# Patient Record
Sex: Female | Born: 1935
Health system: Southern US, Community
[De-identification: ages and names within clinical notes are randomized; demographics above are authoritative.]

## PROBLEM LIST (undated history)

## (undated) DIAGNOSIS — IMO0001 Reserved for inherently not codable concepts without codable children: Secondary | ICD-10-CM

## (undated) DIAGNOSIS — M21969 Unspecified acquired deformity of unspecified lower leg: Secondary | ICD-10-CM

## (undated) DIAGNOSIS — K219 Gastro-esophageal reflux disease without esophagitis: Secondary | ICD-10-CM

## (undated) DIAGNOSIS — K5790 Diverticulosis of intestine, part unspecified, without perforation or abscess without bleeding: Secondary | ICD-10-CM

## (undated) DIAGNOSIS — I2510 Atherosclerotic heart disease of native coronary artery without angina pectoris: Secondary | ICD-10-CM

## (undated) DIAGNOSIS — Z8719 Personal history of other diseases of the digestive system: Secondary | ICD-10-CM

## (undated) DIAGNOSIS — N289 Disorder of kidney and ureter, unspecified: Secondary | ICD-10-CM

## (undated) DIAGNOSIS — K2270 Barrett's esophagus without dysplasia: Secondary | ICD-10-CM

## (undated) DIAGNOSIS — F0390 Unspecified dementia without behavioral disturbance: Secondary | ICD-10-CM

## (undated) DIAGNOSIS — M479 Spondylosis, unspecified: Secondary | ICD-10-CM

## (undated) DIAGNOSIS — B009 Herpesviral infection, unspecified: Secondary | ICD-10-CM

## (undated) DIAGNOSIS — S68119A Complete traumatic metacarpophalangeal amputation of unspecified finger, initial encounter: Secondary | ICD-10-CM

## (undated) DIAGNOSIS — I10 Essential (primary) hypertension: Secondary | ICD-10-CM

## (undated) DIAGNOSIS — R739 Hyperglycemia, unspecified: Secondary | ICD-10-CM

## (undated) DIAGNOSIS — R030 Elevated blood-pressure reading, without diagnosis of hypertension: Secondary | ICD-10-CM

## (undated) DIAGNOSIS — R896 Abnormal cytological findings in specimens from other organs, systems and tissues: Secondary | ICD-10-CM

## (undated) HISTORY — DX: Barrett's esophagus without dysplasia: K22.70

## (undated) HISTORY — DX: Unspecified dementia, unspecified severity, without behavioral disturbance, psychotic disturbance, mood disturbance, and anxiety: F03.90

## (undated) HISTORY — DX: Essential (primary) hypertension: I10

## (undated) HISTORY — DX: Atherosclerotic heart disease of native coronary artery without angina pectoris: I25.10

## (undated) HISTORY — DX: Unspecified acquired deformity of unspecified lower leg: M21.969

## (undated) HISTORY — PX: COLONOSCOPY: SHX174

## (undated) HISTORY — DX: Spondylosis, unspecified: M47.9

## (undated) HISTORY — DX: Diverticulosis of intestine, part unspecified, without perforation or abscess without bleeding: K57.90

## (undated) HISTORY — DX: Complete traumatic metacarpophalangeal amputation of unspecified finger, initial encounter: S68.119A

## (undated) HISTORY — DX: Hyperglycemia, unspecified: R73.9

## (undated) HISTORY — PX: UPPER GASTROINTESTINAL ENDOSCOPY: SHX188

## (undated) HISTORY — DX: Personal history of other diseases of the digestive system: Z87.19

## (undated) HISTORY — DX: Reserved for inherently not codable concepts without codable children: IMO0001

## (undated) HISTORY — DX: Herpesviral infection, unspecified: B00.9

## (undated) HISTORY — DX: Gastro-esophageal reflux disease without esophagitis: K21.9

## (undated) HISTORY — PX: FOOT SURGERY: SHX648

## (undated) HISTORY — DX: Disorder of kidney and ureter, unspecified: N28.9

## (undated) HISTORY — DX: Elevated blood-pressure reading, without diagnosis of hypertension: R03.0

## (undated) HISTORY — PX: PARTIAL HYSTERECTOMY: SHX80

## (undated) HISTORY — PX: AMPUTATION FINGER / THUMB: SUR24

## (undated) HISTORY — DX: Abnormal cytological findings in specimens from other organs, systems and tissues: R89.6

---

## 1998-02-01 ENCOUNTER — Encounter: Admission: RE | Admit: 1998-02-01 | Discharge: 1998-04-04 | Payer: Self-pay | Admitting: Internal Medicine

## 1998-06-16 ENCOUNTER — Other Ambulatory Visit: Admission: RE | Admit: 1998-06-16 | Discharge: 1998-06-16 | Payer: Self-pay | Admitting: *Deleted

## 1998-06-22 ENCOUNTER — Encounter: Admission: RE | Admit: 1998-06-22 | Discharge: 1998-06-22 | Payer: Self-pay | Admitting: Internal Medicine

## 1999-06-21 ENCOUNTER — Other Ambulatory Visit: Admission: RE | Admit: 1999-06-21 | Discharge: 1999-06-21 | Payer: Self-pay | Admitting: *Deleted

## 2000-06-05 ENCOUNTER — Other Ambulatory Visit: Admission: RE | Admit: 2000-06-05 | Discharge: 2000-06-05 | Payer: Self-pay | Admitting: *Deleted

## 2001-03-28 ENCOUNTER — Emergency Department (HOSPITAL_COMMUNITY): Admission: EM | Admit: 2001-03-28 | Discharge: 2001-03-28 | Payer: Self-pay | Admitting: Emergency Medicine

## 2001-04-29 ENCOUNTER — Ambulatory Visit (HOSPITAL_COMMUNITY): Admission: RE | Admit: 2001-04-29 | Discharge: 2001-04-29 | Payer: Self-pay | Admitting: Family Medicine

## 2001-04-29 ENCOUNTER — Encounter: Admission: RE | Admit: 2001-04-29 | Discharge: 2001-04-29 | Payer: Self-pay | Admitting: Family Medicine

## 2001-05-31 ENCOUNTER — Encounter: Admission: RE | Admit: 2001-05-31 | Discharge: 2001-05-31 | Payer: Self-pay | Admitting: Family Medicine

## 2001-06-04 ENCOUNTER — Other Ambulatory Visit: Admission: RE | Admit: 2001-06-04 | Discharge: 2001-06-04 | Payer: Self-pay | Admitting: *Deleted

## 2001-07-22 ENCOUNTER — Encounter: Payer: Self-pay | Admitting: Family Medicine

## 2001-07-22 ENCOUNTER — Inpatient Hospital Stay (HOSPITAL_COMMUNITY): Admission: EM | Admit: 2001-07-22 | Discharge: 2001-07-29 | Payer: Self-pay | Admitting: *Deleted

## 2001-08-10 ENCOUNTER — Emergency Department (HOSPITAL_COMMUNITY): Admission: EM | Admit: 2001-08-10 | Discharge: 2001-08-10 | Payer: Self-pay | Admitting: Emergency Medicine

## 2001-09-17 ENCOUNTER — Encounter: Admission: RE | Admit: 2001-09-17 | Discharge: 2001-09-17 | Payer: Self-pay | Admitting: Family Medicine

## 2001-10-03 ENCOUNTER — Emergency Department (HOSPITAL_COMMUNITY): Admission: EM | Admit: 2001-10-03 | Discharge: 2001-10-03 | Payer: Self-pay | Admitting: Emergency Medicine

## 2001-10-04 ENCOUNTER — Encounter: Payer: Self-pay | Admitting: Emergency Medicine

## 2001-10-12 ENCOUNTER — Emergency Department (HOSPITAL_COMMUNITY): Admission: EM | Admit: 2001-10-12 | Discharge: 2001-10-12 | Payer: Self-pay | Admitting: *Deleted

## 2002-05-11 ENCOUNTER — Emergency Department (HOSPITAL_COMMUNITY): Admission: EM | Admit: 2002-05-11 | Discharge: 2002-05-11 | Payer: Self-pay | Admitting: Emergency Medicine

## 2002-05-21 ENCOUNTER — Ambulatory Visit (HOSPITAL_COMMUNITY): Admission: RE | Admit: 2002-05-21 | Discharge: 2002-05-21 | Payer: Self-pay | Admitting: Cardiovascular Disease

## 2006-11-29 ENCOUNTER — Inpatient Hospital Stay (HOSPITAL_COMMUNITY): Admission: EM | Admit: 2006-11-29 | Discharge: 2006-12-12 | Payer: Self-pay | Admitting: Emergency Medicine

## 2006-11-29 ENCOUNTER — Encounter (INDEPENDENT_AMBULATORY_CARE_PROVIDER_SITE_OTHER): Payer: Self-pay | Admitting: *Deleted

## 2008-04-30 ENCOUNTER — Encounter (HOSPITAL_COMMUNITY): Admission: RE | Admit: 2008-04-30 | Discharge: 2008-07-29 | Payer: Self-pay | Admitting: Emergency Medicine

## 2008-05-05 ENCOUNTER — Telehealth: Payer: Self-pay | Admitting: Gastroenterology

## 2008-05-06 ENCOUNTER — Ambulatory Visit: Payer: Self-pay | Admitting: Gastroenterology

## 2008-05-08 ENCOUNTER — Ambulatory Visit (HOSPITAL_COMMUNITY): Admission: RE | Admit: 2008-05-08 | Discharge: 2008-05-08 | Payer: Self-pay | Admitting: Gastroenterology

## 2008-05-11 ENCOUNTER — Encounter: Payer: Self-pay | Admitting: Gastroenterology

## 2008-05-14 ENCOUNTER — Encounter: Payer: Self-pay | Admitting: Gastroenterology

## 2008-05-14 ENCOUNTER — Ambulatory Visit: Payer: Self-pay | Admitting: Gastroenterology

## 2008-05-14 LAB — CONVERTED CEMR LAB
Basophils Relative: 1.2 % — ABNORMAL HIGH (ref 0.0–1.0)
Eosinophils Relative: 5.4 % — ABNORMAL HIGH (ref 0.0–5.0)
MCHC: 32.1 g/dL (ref 30.0–36.0)
MCV: 75.9 fL — ABNORMAL LOW (ref 78.0–100.0)
Monocytes Absolute: 0.5 10*3/uL (ref 0.1–1.0)
Neutrophils Relative %: 68.7 % (ref 43.0–77.0)
Platelets: 461 10*3/uL — ABNORMAL HIGH (ref 150–400)
RBC: 3.69 M/uL — ABNORMAL LOW (ref 3.87–5.11)
WBC: 5.5 10*3/uL (ref 4.5–10.5)

## 2008-05-20 ENCOUNTER — Encounter: Payer: Self-pay | Admitting: Gastroenterology

## 2008-05-22 ENCOUNTER — Telehealth: Payer: Self-pay | Admitting: Gastroenterology

## 2008-06-02 ENCOUNTER — Encounter: Payer: Self-pay | Admitting: Gastroenterology

## 2008-06-08 ENCOUNTER — Telehealth: Payer: Self-pay | Admitting: Gastroenterology

## 2008-06-16 DIAGNOSIS — I10 Essential (primary) hypertension: Secondary | ICD-10-CM

## 2008-06-16 DIAGNOSIS — K921 Melena: Secondary | ICD-10-CM | POA: Insufficient documentation

## 2008-06-16 DIAGNOSIS — K5730 Diverticulosis of large intestine without perforation or abscess without bleeding: Secondary | ICD-10-CM | POA: Insufficient documentation

## 2008-06-16 DIAGNOSIS — E785 Hyperlipidemia, unspecified: Secondary | ICD-10-CM

## 2008-06-16 DIAGNOSIS — M129 Arthropathy, unspecified: Secondary | ICD-10-CM | POA: Insufficient documentation

## 2008-06-16 DIAGNOSIS — G4730 Sleep apnea, unspecified: Secondary | ICD-10-CM | POA: Insufficient documentation

## 2008-06-16 DIAGNOSIS — H409 Unspecified glaucoma: Secondary | ICD-10-CM | POA: Insufficient documentation

## 2008-06-17 ENCOUNTER — Ambulatory Visit: Payer: Self-pay | Admitting: Gastroenterology

## 2008-06-17 DIAGNOSIS — K2270 Barrett's esophagus without dysplasia: Secondary | ICD-10-CM

## 2008-06-17 DIAGNOSIS — D509 Iron deficiency anemia, unspecified: Secondary | ICD-10-CM

## 2008-06-23 ENCOUNTER — Encounter: Payer: Self-pay | Admitting: Gastroenterology

## 2008-06-23 ENCOUNTER — Telehealth: Payer: Self-pay | Admitting: Gastroenterology

## 2008-06-29 ENCOUNTER — Telehealth: Payer: Self-pay | Admitting: Gastroenterology

## 2008-07-01 ENCOUNTER — Ambulatory Visit: Payer: Self-pay | Admitting: Gastroenterology

## 2008-07-01 LAB — CONVERTED CEMR LAB
Fecal Occult Blood: NEGATIVE
OCCULT 1: NEGATIVE
OCCULT 2: NEGATIVE

## 2008-07-02 ENCOUNTER — Ambulatory Visit: Payer: Self-pay | Admitting: Gastroenterology

## 2008-09-01 ENCOUNTER — Encounter: Payer: Self-pay | Admitting: Gastroenterology

## 2008-10-20 ENCOUNTER — Encounter: Payer: Self-pay | Admitting: Gastroenterology

## 2009-01-20 ENCOUNTER — Encounter: Payer: Self-pay | Admitting: Gastroenterology

## 2009-01-21 ENCOUNTER — Telehealth: Payer: Self-pay | Admitting: Gastroenterology

## 2009-04-20 ENCOUNTER — Encounter: Payer: Self-pay | Admitting: Gastroenterology

## 2009-05-26 ENCOUNTER — Encounter (INDEPENDENT_AMBULATORY_CARE_PROVIDER_SITE_OTHER): Payer: Self-pay | Admitting: *Deleted

## 2009-06-16 ENCOUNTER — Ambulatory Visit: Payer: Self-pay | Admitting: Gastroenterology

## 2009-06-29 ENCOUNTER — Telehealth: Payer: Self-pay | Admitting: Gastroenterology

## 2009-06-30 ENCOUNTER — Ambulatory Visit: Payer: Self-pay | Admitting: Gastroenterology

## 2009-06-30 ENCOUNTER — Encounter: Payer: Self-pay | Admitting: Gastroenterology

## 2009-07-02 ENCOUNTER — Encounter: Payer: Self-pay | Admitting: Gastroenterology

## 2009-07-02 ENCOUNTER — Telehealth: Payer: Self-pay | Admitting: Gastroenterology

## 2009-08-04 ENCOUNTER — Ambulatory Visit: Payer: Self-pay | Admitting: Gastroenterology

## 2009-08-04 LAB — CONVERTED CEMR LAB
Basophils Absolute: 0 10*3/uL (ref 0.0–0.1)
Basophils Relative: 0.1 % (ref 0.0–3.0)
Eosinophils Absolute: 0.5 10*3/uL (ref 0.0–0.7)
Eosinophils Relative: 6.9 % — ABNORMAL HIGH (ref 0.0–5.0)
Hemoglobin: 12.8 g/dL (ref 12.0–15.0)
MCV: 92.6 fL (ref 78.0–100.0)
Neutro Abs: 4.5 10*3/uL (ref 1.4–7.7)
Platelets: 341 10*3/uL (ref 150.0–400.0)
RBC: 4.05 M/uL (ref 3.87–5.11)

## 2009-08-10 ENCOUNTER — Telehealth: Payer: Self-pay | Admitting: Gastroenterology

## 2010-01-26 ENCOUNTER — Telehealth: Payer: Self-pay | Admitting: Gastroenterology

## 2010-01-26 ENCOUNTER — Encounter: Payer: Self-pay | Admitting: Gastroenterology

## 2010-02-01 ENCOUNTER — Telehealth: Payer: Self-pay | Admitting: Gastroenterology

## 2010-02-03 ENCOUNTER — Telehealth: Payer: Self-pay | Admitting: Gastroenterology

## 2010-02-21 ENCOUNTER — Ambulatory Visit: Payer: Self-pay | Admitting: Gastroenterology

## 2010-02-21 DIAGNOSIS — K625 Hemorrhage of anus and rectum: Secondary | ICD-10-CM | POA: Insufficient documentation

## 2010-02-24 ENCOUNTER — Encounter: Payer: Self-pay | Admitting: Gastroenterology

## 2010-03-23 ENCOUNTER — Telehealth: Payer: Self-pay | Admitting: Gastroenterology

## 2010-03-24 ENCOUNTER — Ambulatory Visit: Payer: Self-pay | Admitting: Gastroenterology

## 2010-03-25 ENCOUNTER — Encounter: Payer: Self-pay | Admitting: Gastroenterology

## 2010-03-31 ENCOUNTER — Encounter (INDEPENDENT_AMBULATORY_CARE_PROVIDER_SITE_OTHER): Payer: Self-pay | Admitting: *Deleted

## 2010-04-25 ENCOUNTER — Telehealth: Payer: Self-pay | Admitting: Gastroenterology

## 2010-05-02 ENCOUNTER — Ambulatory Visit: Payer: Self-pay | Admitting: Gastroenterology

## 2010-05-02 LAB — CONVERTED CEMR LAB
ALT: 15 units/L (ref 0–35)
AST: 16 units/L (ref 0–37)
Albumin: 3.5 g/dL (ref 3.5–5.2)
Alkaline Phosphatase: 57 units/L (ref 39–117)
BUN: 12 mg/dL (ref 6–23)
Basophils Relative: 0.8 % (ref 0.0–3.0)
GFR calc non Af Amer: 77.59 mL/min (ref >60)
Glucose, Bld: 82 mg/dL (ref 70–99)
Hemoglobin: 12.9 g/dL (ref 12.0–15.0)
Lymphs Abs: 1 10*3/uL (ref 0.7–4.0)
MCHC: 34.2 g/dL (ref 30.0–36.0)
Monocytes Relative: 6.8 % (ref 3.0–12.0)
Neutrophils Relative %: 67.4 % (ref 43.0–77.0)
RBC: 4.2 M/uL (ref 3.87–5.11)
RDW: 13.8 % (ref 11.5–14.6)
Sodium: 145 meq/L (ref 135–145)
Total Bilirubin: 0.4 mg/dL (ref 0.3–1.2)
Total Protein: 6.4 g/dL (ref 6.0–8.3)
WBC: 4.8 10*3/uL (ref 4.5–10.5)

## 2010-05-09 ENCOUNTER — Telehealth: Payer: Self-pay | Admitting: Gastroenterology

## 2010-05-11 ENCOUNTER — Telehealth: Payer: Self-pay | Admitting: Gastroenterology

## 2010-05-12 ENCOUNTER — Telehealth: Payer: Self-pay | Admitting: Gastroenterology

## 2010-05-12 ENCOUNTER — Ambulatory Visit: Payer: Self-pay | Admitting: Gastroenterology

## 2010-05-13 ENCOUNTER — Telehealth: Payer: Self-pay | Admitting: Gastroenterology

## 2010-05-15 ENCOUNTER — Telehealth: Payer: Self-pay | Admitting: Gastroenterology

## 2010-05-16 ENCOUNTER — Telehealth: Payer: Self-pay | Admitting: Gastroenterology

## 2010-05-17 ENCOUNTER — Encounter: Payer: Self-pay | Admitting: Gastroenterology

## 2010-06-06 ENCOUNTER — Ambulatory Visit: Payer: Self-pay | Admitting: Gastroenterology

## 2010-06-06 LAB — CONVERTED CEMR LAB: OCCULT 4: NEGATIVE

## 2010-06-07 ENCOUNTER — Encounter: Payer: Self-pay | Admitting: Gastroenterology

## 2010-06-15 ENCOUNTER — Observation Stay (HOSPITAL_COMMUNITY): Admission: EM | Admit: 2010-06-15 | Discharge: 2010-06-16 | Payer: Self-pay | Admitting: Emergency Medicine

## 2010-06-15 ENCOUNTER — Ambulatory Visit: Payer: Self-pay | Admitting: Family Medicine

## 2010-07-01 ENCOUNTER — Ambulatory Visit: Payer: Self-pay | Admitting: Gastroenterology

## 2010-08-15 ENCOUNTER — Encounter: Payer: Self-pay | Admitting: Gastroenterology

## 2010-08-15 ENCOUNTER — Telehealth: Payer: Self-pay | Admitting: Gastroenterology

## 2010-11-07 ENCOUNTER — Telehealth: Payer: Self-pay | Admitting: Gastroenterology

## 2010-11-10 ENCOUNTER — Ambulatory Visit: Payer: Self-pay | Admitting: Nurse Practitioner

## 2010-11-10 DIAGNOSIS — Z8711 Personal history of peptic ulcer disease: Secondary | ICD-10-CM | POA: Insufficient documentation

## 2010-11-10 DIAGNOSIS — L089 Local infection of the skin and subcutaneous tissue, unspecified: Secondary | ICD-10-CM | POA: Insufficient documentation

## 2010-11-10 DIAGNOSIS — R1011 Right upper quadrant pain: Secondary | ICD-10-CM | POA: Insufficient documentation

## 2010-11-11 LAB — CONVERTED CEMR LAB
Basophils Relative: 1 % (ref 0.0–3.0)
Eosinophils Absolute: 0.3 10*3/uL (ref 0.0–0.7)
HCT: 36 % (ref 36.0–46.0)
Hemoglobin: 12.1 g/dL (ref 12.0–15.0)
Lymphs Abs: 1.2 10*3/uL (ref 0.7–4.0)
MCHC: 33.6 g/dL (ref 30.0–36.0)
MCV: 93.9 fL (ref 78.0–100.0)
Monocytes Relative: 8.2 % (ref 3.0–12.0)
Neutro Abs: 3.6 10*3/uL (ref 1.4–7.7)
Neutrophils Relative %: 64.3 % (ref 43.0–77.0)
Platelets: 310 10*3/uL (ref 150.0–400.0)
RBC: 3.83 M/uL — ABNORMAL LOW (ref 3.87–5.11)

## 2010-11-15 ENCOUNTER — Encounter: Payer: Self-pay | Admitting: Nurse Practitioner

## 2010-11-15 ENCOUNTER — Encounter: Payer: Self-pay | Admitting: Gastroenterology

## 2010-11-28 ENCOUNTER — Telehealth: Payer: Self-pay | Admitting: Nurse Practitioner

## 2010-12-12 ENCOUNTER — Ambulatory Visit: Payer: Self-pay | Admitting: Gastroenterology

## 2010-12-12 DIAGNOSIS — K5900 Constipation, unspecified: Secondary | ICD-10-CM

## 2010-12-12 DIAGNOSIS — R195 Other fecal abnormalities: Secondary | ICD-10-CM

## 2010-12-12 DIAGNOSIS — K5901 Slow transit constipation: Secondary | ICD-10-CM | POA: Insufficient documentation

## 2011-01-16 ENCOUNTER — Encounter: Payer: Self-pay | Admitting: Emergency Medicine

## 2011-01-23 ENCOUNTER — Other Ambulatory Visit: Payer: Self-pay | Admitting: Obstetrics and Gynecology

## 2011-01-26 NOTE — Progress Notes (Signed)
Summary: Condition Update  Phone Note Call from Patient Call back at Home Phone [phone]   Call For: Dr [name] Summary of Call: Wants to tell Gavin Pound about  her bowels. Has aptt 02-21-10 Initial call taken by: Leanor Kail [facility],  February 01, 2010 11:47 AM  Follow-up for Phone Call        Condition update from 01-27-10: Began Miralax daily, has had a good BM today. She will continue Miralax daily,  keep scheduled office visit and callback as needed.  Follow-up by: Laureen Ochs LPN,  February 01, 2010 12:39 PM

## 2011-01-26 NOTE — Letter (Signed)
Summary: Appt Reminder 2  [facility]  [address] [place], [place] [address]   Phone: [phone]  Fax: [phone]        January 26, 2010 MRN: [id number]    [facility] [address] [place], [place]  [address]    Dear [name],   You have a return appointment with Dr.Jaxton Casale [name] on 02-11-10 at 2:30pm, on the 3rd floor. Please remember to bring a complete list of the medicines you are taking, your insurance card and your co-pay.  If you have to cancel or reschedule this appointment, please call before 5:00 pm the evening before to avoid a cancellation fee.  If you have any questions or concerns, please call [phone].    Sincerely,    Laureen Ochs LPN  Appended Document: Appt Reminder 2 Letter mailed to patient.

## 2011-01-26 NOTE — Letter (Signed)
Summary: Response/Silvers[employer]  Response/Silverscript   Imported By: Lester [place] 03/08/2010 09:55:10  _____________________________________________________________________  External Attachment:    Type:   Image     Comment:   External Document

## 2011-01-26 NOTE — Progress Notes (Signed)
Summary: med ?'s  Phone Note Call from Patient Call back at Home Phone [phone]   Caller: Patient Call For: Dr. [name] Reason for Call: Talk to Nurse Summary of Call: questions about her meds in regards to procedure tomorrow Initial call taken by: Vallarie Mare,  May 11, 2010 2:36 PM  Follow-up for Phone Call        Pt had question re: medications and which ones to take.  Reviewed medications with pt and understanding voiced. Follow-up by: Karl Bales RN,  May 11, 2010 4:03 PM

## 2011-01-26 NOTE — Assessment & Plan Note (Signed)
Summary: procedure follow up...as.   History of Present Illness Visit Type: Follow-up Visit Primary GI MD: Melvia Heaps MD [facility] Primary Provider: Lesle Chris, MD Requesting Provider: n/a Chief Complaint: F/u from procedure. Pt denies any GI complaints  History of Present Illness:   [name] she has returned for followup of her constipation.  On daily MiraLax she is moving her bowels quite regularly.  She varies the dose according to the response.  She has had no further black stools.  Hemoccults were negative x6.   Colonoscopy in March, 2011 demonstrated stercoral ulcers.Since her last visit she was briefly hospitalized with a urinary tract infection.   GI Review of Systems      Denies abdominal pain, acid reflux, belching, bloating, chest pain, dysphagia with liquids, dysphagia with solids, heartburn, loss of appetite, nausea, vomiting, vomiting blood, weight loss, and  weight gain.        Denies anal fissure, black tarry stools, change in bowel habit, constipation, diarrhea, diverticulosis, fecal incontinence, heme positive stool, hemorrhoids, irritable bowel syndrome, jaundice, light color stool, liver problems, rectal bleeding, and  rectal pain.    Current Medications (verified): 1)  Metoprolol Tartrate 50 Mg Tabs (Metoprolol Tartrate) .... Take One By Mouth Two Times A Day 2)  Verapamil Hcl Cr 120 Mg  Cp24 (Verapamil Hcl) .... Once Daily 3)  Gabapentin 800 Mg  Tabs (Gabapentin) .... At Bedtime 4)  Vitamin C 500 Mg  Chew (Ascorbic Acid) .... Once Daily 5)  Womens Hair, Skin & Nails   Tabs (Multiple Vitamins-Minerals) .... Once Daily 6)  Migraine Formula 250-250-65 Mg  Tabs (Aspirin-Acetaminophen-Caffeine) .... As Needed 7)  Multivitamins   Caps (Multiple Vitamin) .... Once Daily 8)  Calcium 500/d 500-200 Mg-Unit  Tabs (Calcium Carbonate-Vitamin D) .... Take Three By Mouth Once Daily 9)  Omeprazole 40 Mg  Cpdr (Omeprazole) .Marland Kitchen.. 1 Tab Daily 10)  Iron 325 (65 Fe) Mg Tabs (Ferrous  Sulfate) .... Take 1 Tablet By Mouth Two Times A Day 11)  Aspir-Low 81 Mg Tbec (Aspirin) .... Take 1 Tablet By Mouth Once A Day 12)  Tylenol 325 Mg Tabs (Acetaminophen) .... Take 1 Tablet By Mouth Two Times A Day 13)  Premarin 0.625 Mg/gm Crea (Estrogens, Conjugated) .... Apply As Directed 14)  Hydroxyzine Hcl 10 Mg Tabs (Hydroxyzine Hcl) .... Take One To Three By Mouth Once Daily 15)  Miralax  Powd (Polyethylene Glycol 3350) .... Mix 1/2 Scoop in Water and Drink Once Daily 16)  Tramadol Hcl 50 Mg Tabs (Tramadol Hcl) .... Take One By Mouth Two Times A Day 17)  Hydrocodone-Acetaminophen 5-500 Mg Tabs (Hydrocodone-Acetaminophen) .... As Needed  Allergies (verified): No Known Drug Allergies  Past History:  Past Medical History: Leg Cramps  Anxiety Disorder Diverticulosis Glaucoma Hypertension  Past Surgical History: Reviewed history from 06/17/2008 and no changes required. hysterectomy breast surgery Removal of index finger  Family History: Reviewed history from 08/04/2009 and no changes required. mother with breast cancer; son with a blastoma; No FH of Colon Cancer:  Social History: Reviewed history from 05/02/2010 and no changes required. retired   no etoh,  no cigs, no drug use  Review of Systems       The patient complains of back pain.  The patient denies allergy/sinus, anemia, anxiety-new, arthritis/joint pain, blood in urine, breast changes/lumps, change in vision, confusion, cough, coughing up blood, depression-new, fainting, fatigue, fever, headaches-new, hearing problems, heart murmur, heart rhythm changes, itching, menstrual pain, muscle pains/cramps, night sweats, nosebleeds, pregnancy symptoms, shortness of  breath, skin rash, sleeping problems, sore throat, swelling of feet/legs, swollen lymph glands, thirst - excessive , urination - excessive , urination changes/pain, urine leakage, vision changes, and voice change.    Vital Signs:  Patient profile:   77 year old  female Height:      62 inches Weight:      106 pounds BMI:     19.46 BSA:     1.46 Pulse rate:   72 / minute Pulse rhythm:   regular BP sitting:   128 / 76  (left arm) Cuff size:   regular  Vitals Entered By: Ok Anis CMA (July 01, 2010 1:08 PM)   Impression & Recommendations:  Problem # 1:  HEMORRHAGE OF RECTUM AND ANUS (ICD-569.3) Assessment Improved Patient will continue to use MiraLax needed.  Bleeding was due to stercoral ulcers.  Problem # 2:  BARRETTS ESOPHAGUS (ICD-530.85)  Last endoscopy in July, 2010.  No dysplasia seen by biopsy.  Plan followup endoscopy in 2 years  Patient Instructions: 1)  Copy sent to : Lesle Chris, MD 2)  We are giving you Miralax samples today 3)  The medication list was reviewed and reconciled.  All changed / newly prescribed medications were explained.  A complete medication list was provided to the patient / caregiver.

## 2011-01-26 NOTE — Assessment & Plan Note (Signed)
Summary: black stools/abdominal pain/[name]   History of Present Illness Visit Type: Follow-up Visit Primary GI MD: Melvia Heaps MD [facility] Primary Kristofor Michalowski: Lesle Chris, MD Requesting Billy Rocco: n/a Chief Complaint: black stool off/on, abdominal pain History of Present Illness:   Patient is a 77 year old black female followed by Dr. [name] for Barrett's esophagus, chronic constipation and history of PUD. Here today with one week history of intermittent epigastric discomfort. She is having hard time being descriptive. Pain independent of meals. She reports black stools. On iron but has been for years. No nausea or vomiting. Taking daily Omeprazole once daily but tells me she is suppose to take it twice daily. On daily Miralax for constipation, reduces to 1/2 capful if stools get loose.      GI Review of Systems    Reports abdominal pain.     Location of  Abdominal pain: epigastric area.    Denies acid reflux, belching, bloating, chest pain, dysphagia with liquids, dysphagia with solids, heartburn, loss of appetite, nausea, vomiting, vomiting blood, weight loss, and  weight gain.      Reports black tarry stools.     Denies anal fissure, change in bowel habit, constipation, diarrhea, diverticulosis, fecal incontinence, heme positive stool, hemorrhoids, irritable bowel syndrome, jaundice, light color stool, liver problems, rectal bleeding, and  rectal pain.   Current Medications (verified): 1)  Metoprolol Tartrate 50 Mg Tabs (Metoprolol Tartrate) .... Take One By Mouth Two Times A Day 2)  Verapamil Hcl Cr 180 Mg Cr-Tabs (Verapamil Hcl) .... Once Daily 3)  Gabapentin 800 Mg  Tabs (Gabapentin) .... At Bedtime 4)  Vitamin C 500 Mg  Chew (Ascorbic Acid) .... Once Daily 5)  Womens Hair, Skin & Nails   Tabs (Multiple Vitamins-Minerals) .... Once Daily 6)  Migraine Formula 250-250-65 Mg  Tabs (Aspirin-Acetaminophen-Caffeine) .... As Needed 7)  Multivitamins   Caps (Multiple Vitamin) .... Once  Daily 8)  Calcium 500/d 500-200 Mg-Unit  Tabs (Calcium Carbonate-Vitamin D) .... Take Three By Mouth Once Daily 9)  Omeprazole 40 Mg  Cpdr (Omeprazole) .... One Tablet By Mouth Once A Day 10)  Iron 325 (65 Fe) Mg Tabs (Ferrous Sulfate) .... Take 1 Tablet By Mouth Two Times A Day 11)  Aspir-Low 81 Mg Tbec (Aspirin) .... Take 1 Tablet By Mouth Once A Day 12)  Tylenol 325 Mg Tabs (Acetaminophen) .... Take 1 Tablet By Mouth Two Times A Day 13)  Premarin 0.625 Mg/gm Crea (Estrogens, Conjugated) .... Apply As Directed 14)  Hydroxyzine Hcl 10 Mg Tabs (Hydroxyzine Hcl) .... Take One To Three By Mouth Once Daily 15)  Miralax  Powd (Polyethylene Glycol 3350) .... Mix 1/2 Scoop in Water and Drink Once Daily 16)  Tramadol Hcl 50 Mg Tabs (Tramadol Hcl) .... Take One By Mouth Two Times A Day 17)  Hydrocodone-Acetaminophen 5-500 Mg Tabs (Hydrocodone-Acetaminophen) .... As Needed 18)  Atarax 10mg  .... Take 1 Every 4 Hours As Needed  Allergies (verified): No Known Drug Allergies  Past History:  Past Medical History: Leg Cramps  Anxiety Disorder Diverticulosis Glaucoma Hypertension PUD May 2011  Past Surgical History: Reviewed history from 06/17/2008 and no changes required. hysterectomy breast surgery Removal of index finger  Family History: Reviewed history from 08/04/2009 and no changes required. mother with breast cancer; son with a blastoma; No FH of Colon Cancer:  Social History: Reviewed history from 05/02/2010 and no changes required. retired   no etoh,  no cigs, no drug use  Review of Systems  The patient complains of melena.  The patient denies anorexia, fever, weight loss, weight gain, vision loss, decreased hearing, hoarseness, chest pain, syncope, dyspnea on exertion, peripheral edema, prolonged cough, headaches, hemoptysis, abdominal pain, hematochezia, severe indigestion/heartburn, hematuria, incontinence, genital sores, muscle weakness, suspicious skin lesions,  transient blindness, difficulty walking, depression, unusual weight change, abnormal bleeding, enlarged lymph nodes, angioedema, breast masses, and testicular masses.    Vital Signs:  Patient profile:   77 year old female Height:      62 inches Weight:      119.38 pounds BMI:     21.91 Pulse rate:   80 / minute Pulse rhythm:   regular BP sitting:   118 / 66  (left arm) Cuff size:   regular  Vitals Entered By: June McMurray CMA Duncan Dull) (November 10, 2010 1:31 PM)  Physical Exam  General:  Well developed, well nourished, no acute distress. Head:  Normocephalic and atraumatic. Neck:  no obvious masses  Lungs:  Clear throughout to auscultation. Heart:  Regular rate and rhythm; no murmurs, rubs,  or bruits. Abdomen:  Abdomen soft, nontender, nondistended. No obvious masses or hepatomegaly.Normal bowel sounds. Umbilicus with surrounding erythema, small amount of foul yellowish drainage.  Rectal:  No external or internal lesions. Scant light brown fluid extracted, heme positive.  Msk:  Spinal deformities Neurologic:  No gross neurological abnormalities Skin:  Intact without significant lesions or rashes. Cervical Nodes:  No significant cervical adenopathy. Psych:  Alert and cooperative. Normal mood and affect.  Impression & Recommendations:  Problem # 1:  ABDOMINAL PAIN-EPIGASTRIC (ICD-789.06) Assessment New One week history of intermittent epigastric pain. On exam patient has what appears to be an infectious process involving umbilicus. Patient does have a history of a small, superficial antral ulcer (May 2011). She reports black stools. Stool light brown on exam but it is hemoccult positive. Patient looks okay, abdominal examination is benign. She is afebrile with temp 98.1 Her pain may be coming from umbilical infection. Will check a CBC, we made appt. for patient to see her PCP early next week  Depending on lab results / clinical course patient may need repeat EGD. Will call patient  early next week for condition update. Patient tells me she is suppose to be on twice daily PPI but is only taking one. For now, increase PPI to BID.  Problem # 2:  INFECTION, SKIN AND SOFT TISSUE (ICD-686.9) Assessment: Comment Only See #1.   Problem # 3:  Hx of PUD, HX OF (ICD-V12.71) Assessment: Comment Only Superficial 4-21mm antral ulcer May 2011. Patient is on chronic PPI.   Problem # 4:  HEMOCCULT POSITIVE STOOL (ICD-578.1) Assessment: Comment Only Patient had colonoscopy several months ago with findings of stercoral ulcers. Etiology of hemoccult positive stool not clear, it may be clinically insignificant or she could have recurrent PUD, erosive disease, etc...  Other Orders: TLB-CBC Platelet - w/Differential (85025-[id number])  Patient Instructions: 1)  Please go to our lab in the basement level. 2)  Increase omeprazaole to two times a day . 3)  We made you an appointment with Dr. Cleta Alberts for atues 11-15-10.  4)  Copy sent to :  Dr. Cleta Alberts 5)  The medication list was reviewed and reconciled.  All changed / newly prescribed medications were explained.  A complete medication list was provided to the patient / caregiver.

## 2011-01-26 NOTE — Letter (Signed)
Summary: Appt Reminder 2   [facility]  [address] [place], [place] [address]   Phone: [phone]  Fax: [phone]        March 31, 2010 MRN: [id number]    [name] [address] [place], [place]  [address]    Dear Ms. BURGERT,   You have a return appointment with Dr. [name] on MAY 9, at 10:15AM.  Please remember to bring a complete list of the medicines you are taking, your insurance card and your co-pay.  If you have to cancel or reschedule this appointment, please call before 5:00 pm the evening before to avoid a cancellation fee.  If you have any questions or concerns, please call [phone].    Sincerely,  [employer]

## 2011-01-26 NOTE — Letter (Signed)
Summary: Prescriber response form/[employer]  Prescriber response form   Imported By: Lester [place] 11/22/2010 11:40:13  _____________________________________________________________________  External Attachment:    Type:   Image     Comment:   External Document

## 2011-01-26 NOTE — Letter (Signed)
Summary: EGD Instructions  [facility]  [address] [place], [place] [address]   Phone: [phone]  Fax: [phone]       [name]    05-12-1933    MRN: [id number]       Procedure Day /Date:05/12/2010 THURSDAY     Arrival Time:1:30PM     Procedure Time:2:30PM     Location of Procedure:                    X  [facility] (4th Floor)   PREPARATION FOR ENDOSCOPY   On 05/12/2010 THE DAY OF THE PROCEDURE:  1.   No solid foods, milk or milk products are allowed after midnight the night before your procedure.  2.   Do not drink anything colored red or purple.  Avoid juices with pulp.  No orange juice.  3.  You may drink clear liquids until12:30PM, which is 2 hours before your procedure.                                                                                                CLEAR LIQUIDS INCLUDE: Water Jello Ice Popsicles Tea (sugar ok, no milk/cream) Powdered fruit flavored drinks Coffee (sugar ok, no milk/cream) Gatorade Juice: apple, white grape, white cranberry  Lemonade Clear bullion, consomm, broth Carbonated beverages (any kind) Strained chicken noodle soup Hard Candy   MEDICATION INSTRUCTIONS  Unless otherwise instructed, you should take regular prescription medications with a small sip of water as early as possible the morning of your procedure.              OTHER INSTRUCTIONS  You will need a responsible adult at least 75 years of age to accompany you and drive you home.   This person must remain in the waiting room during your procedure.  Wear loose fitting clothing that is easily removed.  Leave jewelry and other valuables at home.  However, you may wish to bring a book to read or an iPod/MP3 player to listen to music as you wait for your procedure to start.  Remove all body piercing jewelry and leave at home.  Total time from sign-in until discharge is approximately 2-3 hours.  You should go home directly  after your procedure and rest.  You can resume normal activities the day after your procedure.  The day of your procedure you should not:   Drive   Make legal decisions   Operate machinery   Drink alcohol   Return to work  You will receive specific instructions about eating, activities and medications before you leave.    The above instructions have been reviewed and explained to me by   _______________________    I fully understand and can verbalize these instructions _____________________________ Date _________

## 2011-01-26 NOTE — Progress Notes (Signed)
Summary: TRIAGE--constipation  Phone Note Call from Patient Call back at Home Phone [phone]   Caller: Patient Call For: [name] Reason for Call: Talk to Nurse Summary of Call: Patient states that her bowels are not moving x 3 days, wants to know what to do. Initial call taken by: Tawni Levy,  May 09, 2010 3:08 PM  Follow-up for Phone Call        Pt. has no BM for 3 days, except for "small balls." Pt. instructed to use Miralax 1/2 dose daily, but it isn't working well.  1) Increase Miralax to 17gm BID today, and then once daily until her stools are moving well. May decrease to 1/2 dose daily as needed.  2) Pt. instructed to call back as needed.  Follow-up by: Laureen Ochs LPN,  May 09, 2010 3:36 PM

## 2011-01-26 NOTE — Progress Notes (Signed)
Summary: Triage  Phone Note Call from Patient Call back at Home Phone [phone]   Caller: Patient Call For: Dr. [name] Reason for Call: Talk to Nurse Summary of Call: pt has been constipated with abd cramps.... Been taking Miralax and now has diarrhea Initial call taken by: Karna Christmas,  Apr 25, 2010 3:02 PM  Follow-up for Phone Call        Pt had loose stools for a while so she stopped taking the miralax.  Now she is having trouble going to the bathroom.  Pt instructed to go back on miralax at 1/2 dose.  Pt has follow up appt on 05/02/10. Follow-up by: Ashok Cordia RN,  Apr 25, 2010 3:38 PM

## 2011-01-26 NOTE — Letter (Signed)
Summary: [facility] Instructions  [facility]  [address] [place], [place] [address]   Phone: [phone]  Fax: [phone]       [name]    1933-03-24    MRN: [id number]        Procedure Day /Date:MONDAY 02/28/2010     Arrival Time:2:30PM     Procedure Time:3:30PM     Location of Procedure:                    X   [facility] (4th Floor)   PREPARATION FOR COLONOSCOPY WITH MOVIPREP   Starting 5 days prior to your procedure3/01/2010 do not eat nuts, seeds, popcorn, corn, beans, peas,  salads, or any raw vegetables.  Do not take any fiber supplements (e.g. Metamucil, Citrucel, and Benefiber).  THE DAY BEFORE YOUR PROCEDURE         DATE: 02/27/2010 DAY: SUNDAY  1.  Drink clear liquids the entire day-NO SOLID FOOD  2.  Do not drink anything colored red or purple.  Avoid juices with pulp.  No orange juice.  3.  Drink at least 64 oz. (8 glasses) of fluid/clear liquids during the day to prevent dehydration and help the prep work efficiently.  CLEAR LIQUIDS INCLUDE: Water Jello Ice Popsicles Tea (sugar ok, no milk/cream) Powdered fruit flavored drinks Coffee (sugar ok, no milk/cream) Gatorade Juice: apple, white grape, white cranberry  Lemonade Clear bullion, consomm, broth Carbonated beverages (any kind) Strained chicken noodle soup Hard Candy                             4.  In the morning, mix first dose of MoviPrep solution:    Empty 1 Pouch A and 1 Pouch B into the disposable container    Add lukewarm drinking water to the top line of the container. Mix to dissolve    Refrigerate (mixed solution should be used within 24 hrs)  5.  Begin drinking the prep at 5:00 p.m. The MoviPrep container is divided by 4 marks.   Every 15 minutes drink the solution down to the next mark (approximately 8 oz) until the full liter is complete.   6.  Follow completed prep with 16 oz of clear liquid of your choice (Nothing red or purple).  Continue to  drink clear liquids until bedtime.  7.  Before going to bed, mix second dose of MoviPrep solution:    Empty 1 Pouch A and 1 Pouch B into the disposable container    Add lukewarm drinking water to the top line of the container. Mix to dissolve    Refrigerate  THE DAY OF YOUR PROCEDURE      DATE: 02/28/2010 DAY: MONDAY  Beginning at 10:30a.m. (5 hours before procedure):         1. Every 15 minutes, drink the solution down to the next mark (approx 8 oz) until the full liter is complete.  2. Follow completed prep with 16 oz. of clear liquid of your choice.    3. You may drink clear liquids until 1:30PM (2 HOURS BEFORE PROCEDURE).   MEDICATION INSTRUCTIONS  Unless otherwise instructed, you should take regular prescription medications with a small sip of water   as early as possible the morning of your procedure.          OTHER INSTRUCTIONS  You will need a responsible adult at least 75 years of age to accompany you and  drive you home.   This person must remain in the waiting room during your procedure.  Wear loose fitting clothing that is easily removed.  Leave jewelry and other valuables at home.  However, you may wish to bring a book to read or  an iPod/MP3 player to listen to music as you wait for your procedure to start.  Remove all body piercing jewelry and leave at home.  Total time from sign-in until discharge is approximately 2-3 hours.  You should go home directly after your procedure and rest.  You can resume normal activities the  day after your procedure.  The day of your procedure you should not:   Drive   Make legal decisions   Operate machinery   Drink alcohol   Return to work  You will receive specific instructions about eating, activities and medications before you leave.    The above instructions have been reviewed and explained to me by   _______________________    I fully understand and can verbalize these instructions  _____________________________ Date _________

## 2011-01-26 NOTE — Letter (Signed)
Summary: Results Letter   Gastroenterology  [address] [place], [place] [address]   Phone: [phone]  Fax: [phone]        March 25, 2010 MRN: [id number]    [name] [address] [place], [place]  [address]    Dear Ms. DOLLENS,   Your biopsies demonstrated inflammatory changes only.    Please follow the recommendations previously discussed.  Should you have any immediate concerns or questions, feel free to contact me at the office.    Sincerely,  Barbette Hair. [name], M.D., [facility]          Sincerely,  Louis Meckel MD  This letter has been electronically signed by your physician.  Appended Document: Results Letter letter mailed 4.6.11

## 2011-01-26 NOTE — Letter (Signed)
Summary: Results Letter  [facility]  [address] [place], [place] [address]   Phone: [phone]  Fax: [phone]        June 07, 2010 MRN: [id number]    [facility] [address] [place], [place]  [address]    Dear Ms. APPELHANS,  Your hemeoccult cards testing for microscopic bleeding were negative.  No further GI workup is required at this time.   Please continue with the recommendations that we previously discussed. Should you have any further questions or concerns, feel free to contact me.    Sincerely,  Barbette Hair. [name], M.D., [facility]          Sincerely,  Louis Meckel MD  This letter has been electronically signed by your physician.  Appended Document: Results Letter Letter mailed to patient.

## 2011-01-26 NOTE — Progress Notes (Signed)
Summary: Triage  Phone Note Call from Patient Call back at Home Phone [phone]   Caller: Patient Call For: Dr. [name] Reason for Call: Talk to Nurse Summary of Call: pt is having alot of loose stool and wants to discuss Initial call taken by: Karna Christmas,  May 16, 2010 8:47 AM  Follow-up for Phone Call        Says she is having "big fat stools" has not used her Miralax so I re-instructed her and she did not read info for stool cards and ate red meat yesterday so instructed to follow directions and obtain stool specimens 05/23/10 Follow-up by: Teryl Lucy RN,  May 16, 2010 9:17 AM

## 2011-01-26 NOTE — Procedures (Signed)
Summary: Upper Endoscopy  Patient: [name] Note: All result statuses are Final unless otherwise noted.  Tests: (1) Upper Endoscopy (EGD)   EGD Upper Endoscopy       DONE     [facility]     [address]     [place], [place]  [address]           ENDOSCOPY PROCEDURE REPORT           PATIENT:  [name], [name]  MR#:  [id number]     BIRTHDATE:  February 15, 1933, 76 yrs. old  GENDER:  female           ENDOSCOPIST:  Barbette Hair. [name], MD     Referred by:           PROCEDURE DATE:  05/12/2010     PROCEDURE:  EGD with biopsy     ASA CLASS:  Class II     INDICATIONS:  anemia;  questionable h/o melena           MEDICATIONS:   Fentanyl 75 mcg IV, Versed 9 mg IV, glycopyrrolate     (Robinal) 0.2 mg IV, 0.6cc simethancone 0.6 cc PO     TOPICAL ANESTHETIC:  Exactacain Spray           DESCRIPTION OF PROCEDURE:   After the risks benefits and     alternatives of the procedure were thoroughly explained, informed     consent was obtained.  The [employer] [id number]-H180 [id number] endoscope was     introduced through the mouth and advanced to the third portion of     the duodenum, without limitations.  The instrument was slowly     withdrawn as the mucosa was fully examined.     <<PROCEDUREIMAGES>>           An ulcer was found in the antrum. Superficial 4-65mm ulcer with     normal surrounding mucosa. Bxs taken (see image2).  There were     columnar-type mucosal changes in the distal esophagus, that could     represent Barrett's esophagus. Z line at 29cm. Multiple biopsies     were obtained and sent to pathology (see image3 and image4) to r/o     Barrett's esophagus.  A hiatal hernia was found. 5-6cm sliding     hiatal hernia  Otherwise the examination was normal.     Retroflexed views revealed no abnormalities.    The scope was then     withdrawn from the patient and the procedure completed.           COMPLICATIONS:  None           ENDOSCOPIC IMPRESSION:     1) Ulcer in the antrum     2) Barrett's,  possible     3) Hiatal hernia     4) Otherwise normal examination     RECOMMENDATIONS:     1) continue PPI - omeprazole     2) hemmoccult stools in 1 week     3) Call office next 2-3 days to schedule an office appointment     for 1 month           REPEAT EXAM:  No           ______________________________     Barbette Hair. [name], MD           CC:  Lesle Chris, MD           n.     eSIGNED:  Barbette Hair. Kaplan at 05/12/2010 03:07 PM           [name], [id number]  Note: An exclamation mark (!) indicates a result that was not dispersed into the flowsheet. Document Creation Date: 05/12/2010 5:08 PM _______________________________________________________________________  (1) Order result status: Final Collection or observation date-time: 05/12/2010 15:00 Requested date-time:  Receipt date-time:  Reported date-time:  Referring Physician:   Ordering Physician: Melvia Heaps [phone]) Specimen Source:  Source: [name] Order Number: [id number] Lab site:

## 2011-01-26 NOTE — Progress Notes (Signed)
Summary: Triage  Phone Note Call from Patient Call back at Home Phone [phone]   Caller: Patient Call For: Dr. [name] Reason for Call: Talk to Nurse Summary of Call: Pt wants to know when she is due for her colonoscopy. Last one was  05-14-2008. Initial call taken by: Karna Christmas,  January 26, 2010 3:49 PM  Follow-up for Phone Call        DR.Shirl Ludington--Please advise.  Follow-up by: Laureen Ochs LPN,  January 26, 2010 3:54 PM  Additional Follow-up for Phone Call Additional follow up Details #1::        not for 2019 Additional Follow-up by: Louis Meckel MD,  January 26, 2010 4:44 PM    Additional Follow-up for Phone Call Additional follow up Details #2::    Pt. c/o worsening constipation and bleeding with large stools. Uses Metamucil daily and Fleets Supp. as needed. 1) Miralax 17gm mixed in 8oz. water or juice-daily.May use BID PRN.  2) OV 02-11-10 at 2:30pm 3) Pt. instructed to call back as needed.  Follow-up by: Laureen Ochs LPN,  January 26, 2010 5:11 PM

## 2011-01-26 NOTE — Procedures (Signed)
Summary: Colonoscopy  Patient: [name] Note: All result statuses are Final unless otherwise noted.  Tests: (1) Colonoscopy (COL)   COL Colonoscopy           [facility]     [address]     [place], [place]  [address]           COLONOSCOPY PROCEDURE REPORT           PATIENT:  [name], [name]  MR#:  [id number]     BIRTHDATE:  08-13-1933, 76 yrs. old  GENDER:  female     ENDOSCOPIST:  Barbette Hair. [name], MD     REF. BY:     PROCEDURE DATE:  03/24/2010     PROCEDURE:  Colonoscopy with biopsy     ASA CLASS:  Class II     INDICATIONS:  rectal bleeding     MEDICATIONS:   Fentanyl 75 mcg IV, Versed 7 mg IV           DESCRIPTION OF PROCEDURE:   After the risks benefits and     alternatives of the procedure were thoroughly explained, informed     consent was obtained.  Digital rectal exam was performed and     revealed no abnormalities.   The [employer] [id number] endoscope     was introduced through the anus and advanced to the cecum, which     was identified by both the appendix and ileocecal valve, without     limitations.  The quality of the prep was excellent, using     MoviPrep.  The instrument was then slowly withdrawn as the colon     was fully examined.     <<PROCEDUREIMAGES>>           FINDINGS:  ulcers in the rectum. Multiple superficial ulcers in     rectal vault. Bxs taken (see image14, image13, and image15).     Scattered diverticula were found ascending colon to sigmoid colon     (see image9, image11, image12, image1, and image2).  This was     otherwise a normal examination of the colon (see image4, image6,     image8, and image10).   Retroflexed views in the rectum revealed     no abnormalities.    The scope was then withdrawn from the patient     and the procedure completed.           COMPLICATIONS:  None     ENDOSCOPIC IMPRESSION:     1) Ulcers in the rectum - Stercoral ulcers causing limited     rectal bleeding     2) Diverticula, scattered  ascending colon to sigmoid colon     3) Otherwise normal examination     RECOMMENDATIONS:     1) continue current medications - miralax, stool softeners     2) call office next 1-3 days to schedule followup visit in 1     month     REPEAT EXAM:  No           ______________________________     Barbette Hair. [name], MD           CC:  Lesle Chris, MD           n.     Rosalie Doctor:   Barbette Hair. Kaplan at 03/24/2010 08:52 AM           [name], [id number]  Note: An exclamation mark (!) indicates a result that  was not dispersed into the flowsheet. Document Creation Date: 03/24/2010 8:53 AM _______________________________________________________________________  (1) Order result status: Final Collection or observation date-time: 03/24/2010 08:36 Requested date-time:  Receipt date-time:  Reported date-time:  Referring Physician:   Ordering Physician: Melvia Heaps [phone]) Specimen Source:  Source: [name] Order Number: [id number] Lab site:   Appended Document: Colonoscopy

## 2011-01-26 NOTE — Letter (Signed)
Summary: [facility]  [facility]   Imported By: Sherian Rein 12/20/2010 10:23:49  _____________________________________________________________________  External Attachment:    Type:   Image     Comment:   External Document

## 2011-01-26 NOTE — Progress Notes (Signed)
  Phone Note Call from Patient   Caller: Patient Summary of Call: On call note. Patient has on going diarrhea. She has undergone recent evaluation by Dr. [name] and wanted her recent endoscopic biopsy report. No new symptoms. Advised to call the office in the morning for follow up information. Initial call taken by: Meryl Dare MD Clementeen Graham,  May 16, 2010 9:44 AM

## 2011-01-26 NOTE — Assessment & Plan Note (Signed)
Summary: F/U Heme positive stoos, umbilical infection   History of Present Illness Visit Type: Follow-up Visit Primary GI MD: Melvia Heaps MD [facility] Primary Provider: Lesle Chris, MD Requesting Provider: n/a Chief Complaint: F/u for black stools, and umbilical infection. Pt states that her BMs are better but she is still having some abd pain   History of Present Illness:   Patient is a 77 year old black female followed by Dr. [name] for Barrett's esophagus, chronic constipation and history of PUD. I saw her last month for epigastric pain and complaints of black stools.  At that visit I noticed an infectious process involving the patient's umbilicus, made patient an appt. with her PCP and asked her to then follow up with me after seeing her PCP.Because of her history of PUD in May, I wanted to make sure her abdominal pain and complaints of black stool weren't from recurrent PUD. Patient tells me her umbilicus is 100% better. Patient mentioned abdominal pain to medical assistant but tells me she isn't having any abdominal pain. Bowel movements are normal but Miralax is expensive and she cannot take it everyday.  No black stools. Historically patient's weight has fluctuated 5-10 pounds between visits and she is currently down 5 pounds from her November visit.   GI Review of Systems      Denies abdominal pain, acid reflux, belching, bloating, chest pain, dysphagia with liquids, dysphagia with solids, heartburn, loss of appetite, nausea, vomiting, vomiting blood, weight loss, and  weight gain.        Denies anal fissure, black tarry stools, change in bowel habit, constipation, diarrhea, diverticulosis, fecal incontinence, heme positive stool, hemorrhoids, irritable bowel syndrome, jaundice, light color stool, liver problems, rectal bleeding, and  rectal pain.   Current Medications (verified): 1)  Metoprolol Tartrate 50 Mg Tabs (Metoprolol Tartrate) .... Take One By Mouth Two Times A Day 2)   Verapamil Hcl Cr 180 Mg Cr-Tabs (Verapamil Hcl) .... Once Daily 3)  Gabapentin 800 Mg  Tabs (Gabapentin) .... At Bedtime 4)  Vitamin C 500 Mg  Chew (Ascorbic Acid) .... Once Daily 5)  Womens Hair, Skin & Nails   Tabs (Multiple Vitamins-Minerals) .... Once Daily 6)  Migraine Formula 250-250-65 Mg  Tabs (Aspirin-Acetaminophen-Caffeine) .... As Needed 7)  Multivitamins   Caps (Multiple Vitamin) .... Once Daily 8)  Calcium 500/d 500-200 Mg-Unit  Tabs (Calcium Carbonate-Vitamin D) .... Take Three By Mouth Once Daily 9)  Omeprazole 40 Mg  Cpdr (Omeprazole) .... One Tablet By Mouth Once A Day 10)  Iron 325 (65 Fe) Mg Tabs (Ferrous Sulfate) .... Take 1 Tablet By Mouth Two Times A Day 11)  Aspir-Low 81 Mg Tbec (Aspirin) .... Take 1 Tablet By Mouth Once A Day 12)  Tylenol 325 Mg Tabs (Acetaminophen) .... Take 1 Tablet By Mouth Two Times A Day 13)  Premarin 0.625 Mg/gm Crea (Estrogens, Conjugated) .... Apply As Directed 14)  Hydroxyzine Hcl 10 Mg Tabs (Hydroxyzine Hcl) .... Take One To Three By Mouth Once Daily 15)  Miralax  Powd (Polyethylene Glycol 3350) .... Mix 1/2 Scoop in Water and Drink Once Daily 16)  Tramadol Hcl 50 Mg Tabs (Tramadol Hcl) .... Take One By Mouth Two Times A Day 17)  Hydrocodone-Acetaminophen 5-500 Mg Tabs (Hydrocodone-Acetaminophen) .... As Needed 18)  Atarax 10mg  .... Take 1 Every 4 Hours As Needed  Allergies (verified): No Known Drug Allergies  Past History:  Past Medical History: Leg Cramps  Hx of PUD, HX OF (ICD-V12.71) May 2011  INFECTION, SKIN  AND SOFT TISSUE (ICD-686.9) HEMORRHAGE OF RECTUM AND ANUS (ICD-569.3) ANEMIA, IRON DEFICIENCY (ICD-280.9) BARRETTS ESOPHAGUS (ICD-530.85) HYPERTENSION (ICD-401.9) HYPERLIPIDEMIA (ICD-272.4) ARTHRITIS (ICD-716.90) SLEEP APNEA (ICD-780.57) GLAUCOMA (ICD-365.9) DIVERTICULOSIS, COLON (ICD-562.10) HEMOCCULT POSITIVE STOOL (ICD-578.1)  Past Surgical History: Reviewed history from 06/17/2008 and no changes  required. hysterectomy breast surgery Removal of index finger  Family History: Reviewed history from 08/04/2009 and no changes required. mother with breast cancer; son with a blastoma; No FH of Colon Cancer:  Social History: Reviewed history from 05/02/2010 and no changes required. retired   no etoh,  no cigs, no drug use  Review of Systems       The patient complains of arthritis/joint pain and confusion.  The patient denies allergy/sinus, anemia, anxiety-new, back pain, blood in urine, breast changes/lumps, change in vision, cough, coughing up blood, depression-new, fainting, fatigue, fever, headaches-new, hearing problems, heart murmur, heart rhythm changes, itching, menstrual pain, muscle pains/cramps, night sweats, nosebleeds, pregnancy symptoms, shortness of breath, skin rash, sleeping problems, sore throat, swelling of feet/legs, swollen lymph glands, thirst - excessive , urination - excessive , urination changes/pain, urine leakage, vision changes, and voice change.    Vital Signs:  Patient profile:   77 year old female Height:      62 inches Weight:      112 pounds BMI:     20.56 BSA:     1.50 Pulse rate:   76 / minute Pulse rhythm:   regular BP sitting:   132 / 84  (left arm) Cuff size:   regular  Vitals Entered By: Ok Anis CMA (December 12, 2010 8:39 AM)  Physical Exam  General:  Well developed, well nourished, no acute distress. Head:  Normocephalic and atraumatic. Eyes:  Conjunctiva pink, no icterus.  Neck:  no obvious masses  Lungs:  Clear throughout to auscultation. Heart:  Regular rate and rhythm; no murmurs, rubs,  or bruits. Abdomen:  Abdomen soft, nontender, nondistended. No obvious masses or hepatomegaly.Normal bowel sounds.  Rectal:  No external or internal lesions. LIght brown, heme negative stool.  Msk:  Skeletal deformity of upper back Neurologic:  Alert and  oriented x4;  grossly normal neurologically. Skin:  Intact without significant  lesions or rashes. Cervical Nodes:  No significant cervical adenopathy. Psych:  Alert and cooperative. Normal mood and affect.  Impression & Recommendations:  Problem # 1:  ABDOMINAL PAIN-EPIGASTRIC (ICD-789.06) Assessment Comment Only Her pain has resolved. Not sure what caused the pain. She did have a mild umbilical inflammatory process going on, perhaps some of her pain was related to that. Her abdominal exam is benign. Follow up with Dr. [name] as needed.   Problem # 2:  NONSPECIFIC ABNORMAL FINDING IN STOOL CONTENTS (ICD-792.1) Assessment: Comment Only At our visit in November patient described intermittent black stool. She hasn't had any black stools recently. On exam today her stool is light brown, heme negative.   Problem # 3:  BARRETTS ESOPHAGUS (ICD-530.85) Continue PPI.   Problem # 4:  CONSTIPATION (ICD-564.00) Assessment: Improved Chronic constipation. Bowel movements are normal on daily Miralax though she cannot take it everyday secondary to expense.  Problem # 5:  Hx of PUD, HX OF (ICD-V12.71) Assessment: Comment Only Small antral ulcer May 2011. Patient on daily PPI.   Patient Instructions: 1)  If you have any questions or problems call us.  2)  Please call us and make an appointment to see Dr. [name]. 3)  Copy sent to : Dr Lesle Chris 4)  The medication list was reviewed  and reconciled.  All changed / newly prescribed medications were explained.  A complete medication list was provided to the patient / caregiver.

## 2011-01-26 NOTE — Progress Notes (Signed)
Summary: speak to nurse  Phone Note Call from Patient Call back at Home Phone [phone]   Caller: [name] Call For: [name] Reason for Call: Talk to Nurse Summary of Call: Patient  would like to speak to nurse regarding questions he has about EGD his mother had done. Initial call taken by: Tawni Levy,  May 12, 2010 9:51 AM  Follow-up for Phone Call        ok Follow-up by: Louis Meckel MD,  May 12, 2010 2:43 PM  Additional Follow-up for Phone Call Additional follow up Details #1::        Returned pts phone call.  Pts son asked about his mother resuming her medications.  Advised him that she could take her iron and her Aspirin this evening.  He verbalized understanding. Additional Follow-up by: Jennye Boroughs RN,  May 12, 2010 5:54 PM

## 2011-01-26 NOTE — Assessment & Plan Note (Signed)
Summary: CONSTIPATION, RECTAL BLEEDING              [name]   History of Present Illness Visit Type: Follow-up Visit Primary GI MD: Melvia Heaps MD [facility] Primary Provider: Lesle Chris, MD Chief Complaint: Patient complains of constipation, which is better now since starting Miralax. She does complain of seeing BRB.  History of Present Illness:   [name] has returned for evaluation of rectal bleeding.  She has had multiple episodes of bright red blood mixed with her stools.  She is without abdominal pain or rectal pain.  She has been complaining of constipation and has taken MiraLax as needed.  Colonoscopy in 2004 demonstrated diverticulosis.  She is taking supplementary iron.  She has a history of glaucoma   GI Review of Systems      Denies abdominal pain, acid reflux, belching, bloating, chest pain, dysphagia with liquids, dysphagia with solids, heartburn, loss of appetite, nausea, vomiting, vomiting blood, weight loss, and  weight gain.      Reports constipation and  rectal bleeding.     Denies anal fissure, black tarry stools, change in bowel habit, diarrhea, diverticulosis, fecal incontinence, heme positive stool, hemorrhoids, irritable bowel syndrome, jaundice, light color stool, liver problems, and  rectal pain.    Current Medications (verified): 1)  Metoprolol Tartrate 50 Mg Tabs (Metoprolol Tartrate) .... Take One By Mouth Two Times A Day 2)  Verapamil Hcl Cr 120 Mg  Cp24 (Verapamil Hcl) .... Once Daily 3)  Gabapentin 800 Mg  Tabs (Gabapentin) .... At Bedtime 4)  Vitamin C 500 Mg  Chew (Ascorbic Acid) .... Once Daily 5)  Womens Hair, Skin & Nails   Tabs (Multiple Vitamins-Minerals) .... Once Daily 6)  Migraine Formula 250-250-65 Mg  Tabs (Aspirin-Acetaminophen-Caffeine) .... As Needed 7)  Multivitamins   Caps (Multiple Vitamin) .... Once Daily 8)  Calcium 500/d 500-200 Mg-Unit  Tabs (Calcium Carbonate-Vitamin D) .... Take Three By Mouth Once Daily 9)  Omeprazole 40 Mg  Cpdr  (Omeprazole) .Marland Kitchen.. 1 Tab Daily 10)  Iron 325 (65 Fe) Mg Tabs (Ferrous Sulfate) .... Take 1 Tablet By Mouth Two Times A Day 11)  Aspir-Low 81 Mg Tbec (Aspirin) .... Take 1 Tablet By Mouth Once A Day 12)  Tylenol 325 Mg Tabs (Acetaminophen) .... Take 1 Tablet By Mouth Two Times A Day 13)  Premarin 0.625 Mg/gm Crea (Estrogens, Conjugated) .... Apply As Directed 14)  Hydroxyzine Hcl 10 Mg Tabs (Hydroxyzine Hcl) .... Take One To Three By Mouth Once Daily 15)  Miralax  Powd (Polyethylene Glycol 3350) .... Mix 1/2 Scoop in Water and Drink Once Daily 16)  Tramadol Hcl 50 Mg Tabs (Tramadol Hcl) .... Take One By Mouth Two Times A Day  Allergies (verified): No Known Drug Allergies  Past History:  Past Medical History: Last updated: 06/17/2008 leg cramps Anxiety Disorder Diverticulosis Glaucoma Hypertension  Past Surgical History: Last updated: 06/17/2008 hysterectomy breast surgery Removal of index finger  Family History: Last updated: 08/04/2009 mother with breast cancer; son with a blastoma; No FH of Colon Cancer:  Social History: retired; no etoh, cigs, drug use  Review of Systems  The patient denies allergy/sinus, anemia, anxiety-new, arthritis/joint pain, back pain, blood in urine, breast changes/lumps, change in vision, confusion, cough, coughing up blood, depression-new, fainting, fatigue, fever, headaches-new, hearing problems, heart murmur, heart rhythm changes, itching, menstrual pain, muscle pains/cramps, night sweats, nosebleeds, pregnancy symptoms, shortness of breath, skin rash, sleeping problems, sore throat, swelling of feet/legs, swollen lymph glands, thirst - excessive ,  urination - excessive , urination changes/pain, urine leakage, vision changes, and voice change.    Vital Signs:  Patient profile:   77 year old female Height:      62 inches Weight:      127.4 pounds Pulse rate:   80 / minute Pulse rhythm:   regular BP sitting:   140 / 70  (left arm) Cuff size:    regular  Vitals Entered By: Harlow Mares CMA Duncan Dull) (February 21, 2010 10:02 AM)   Impression & Recommendations:  Problem # 1:  HEMORRHAGE OF RECTUM AND ANUS (ICD-569.3)  Rrectal bleeding could be due to hemorrhoids.  A more proximal colonic bleeding source should be ruled out including polyps AVM and neoplasm.  Recommendations #1 colonoscopy  Risks, alternatives, and complications of the procedure, including bleeding, perforation, and possible need for surgery, were explained to the patient.  Patient's questions were answered.  Orders: Colonoscopy (Colon)  Problem # 2:  ANEMIA, IRON DEFICIENCY (ICD-280.9)  Colonic sources as mentioned above could be the source of her iron deficiency.  Upper GI sources including ulcers, AVMs or neoplasm are other possibilities.  Recommendations #1 serial Hemoccults if colonoscopy is not diagnostic for a GI bleeding source  Orders: Colonoscopy (Colon)  Problem # 3:  BARRETTS ESOPHAGUS (ICD-530.85) Last endoscopy in July, 2010.  No dysplasia seen by biopsy.  Plan followup endoscopy in 2 years  Patient Instructions: 1)  CC Dr. Lesle Chris 2)  Colonoscopy and Flexible Sigmoidoscopy brochure given.  3)  Conscious Sedation brochure given.  4)  Your Colonoscopy is scheduled for 02/28/2010 at 3:30pm 5)  Please pick up your MoviPrep from your pharmacy today 6)  You will have to have someone here with you and drive you home. They may not leave, they will have to wait in the waiting room. 7)  Hold Iron starting 02/22/2010, until after your procedure 8)  The medication list was reviewed and reconciled.  All changed / newly prescribed medications were explained.  A complete medication list was provided to the patient / caregiver. Prescriptions: MOVIPREP 100 GM  SOLR (PEG-KCL-NACL-NASULF-NA ASC-C) As per prep instructions.  #1 x 0   Entered by:   Merri Ray CMA (AAMA)   Authorized by:   Louis Meckel MD   Signed by:   Merri Ray CMA (AAMA) on  02/21/2010   Method used:   Electronically to        [id number] [address] (retail)       [address]       [place], [place]  [id number]       Ph: [phone]       Fax: [phone]   RxID:   [id number]

## 2011-01-26 NOTE — Progress Notes (Signed)
Summary: prep ?'s  Phone Note Call from Patient Call back at Home Phone [phone]   Caller: Patient Call For: Dr. [name] Reason for Call: Talk to Nurse Summary of Call: pt has questions about what she can and cannot take regarding medications... procedure tomorrow Initial call taken by: Vallarie Mare,  March 23, 2010 9:12 AM  Follow-up for Phone Call        attempted to call pt.x2 no answer. Follow-up by: Greer Ee RN,  March 23, 2010 11:10 AM  Additional Follow-up for Phone Call Additional follow up Details #1::        called pt. back and she wanted to know if it was okay to take RX medication for B/P. Instructed pt. to take B/P medication the way she normally take it. Additional Follow-up by: Greer Ee RN,  March 23, 2010 3:22 PM

## 2011-01-26 NOTE — Progress Notes (Signed)
Summary: TRIAGE  Phone Note Call from Patient Call back at Home Phone [phone]   Caller: Patient Call For: [facility] Reason for Call: Talk to Nurse Summary of Call: Patient wants to know what to take for diarrhea Initial call taken by: Tawni Levy,  February 03, 2010 1:03 PM  Follow-up for Phone Call        Pt. is taking Miralax for constipation, now she is having loose stools. She will hold Miralax for now and use as needed. Pt. to keep scheduled office visit.Pt. instructed to call back as needed.  Follow-up by: Laureen Ochs LPN,  February 03, 2010 1:23 PM

## 2011-01-26 NOTE — Progress Notes (Signed)
Summary: Discuss Endo done yesterday w/nurse  Phone Note Call from Patient Call back at Home Phone [phone]   Call For: Dr [name] Summary of Call: Wants to discuss her procedure with nurse Initial call taken by: Leanor Kail [facility],  May 13, 2010 12:37 PM  Follow-up for Phone Call        RETURNED PHONE CALL AND ANSWERED QUESTIONS CONCERNING PROCEDURE. ENCOURAGE TO CALL BACK IF ANY MORE QUESTIONS. Follow-up by: Darlyn Read RN,  May 13, 2010 2:03 PM

## 2011-01-26 NOTE — Letter (Signed)
Summary: Results Letter  [facility]  [address] [place], [place] [address]   Phone: [phone]  Fax: [phone]        May 17, 2010 MRN: [id number]    [facility] [address] [place], [place]  [address]    Dear Ms. BELLI,   Your biopsies demonstrated inflammatory changes only.    Please follow the recommendations previously discussed.  Should you have any immediate concerns or questions, feel free to contact me at the office.    Sincerely,  Barbette Hair. [name], M.D., [facility]          Sincerely,  Louis Meckel MD  This letter has been electronically signed by your physician.  Appended Document: Results Letter letter mailed.

## 2011-01-26 NOTE — Progress Notes (Signed)
Summary: Triage  Phone Note Call from Patient Call back at Home Phone [phone]   Caller: Patient Call For: Gunnar Fusi Reason for Call: Talk to Nurse Summary of Call: Pt wants to speak with assistant because her belly button is still irritated and the medication she is taking for infection is causing her to have diarrhea, should she continue to take medication? Initial call taken by: [place] Johnson,  November 28, 2010 8:43 AM  Follow-up for Phone Call        The pt is no longer having abd pain.  Her navel is doing better.  She is using the cream  Dr Cleta Alberts prescribed.   She has one more day of cipro.  I told her to call Dr Cleta Alberts and ask them if she needs more cipro.   Her  navel is not tender.  It is just a little pink now.   Follow-up by: Joselyn Glassman,  November 28, 2010 4:42 PM  Additional Follow-up for Phone Call Additional follow up Details #1::        Can you get a copy of Dr. Ellis Parents note?   Her pain may have been related to the umbilical infection but she should see [place] back to be safe (her stools were heme positive and she has history of PUD). If Dr. [name] doesn't have anything, you can put her in with me (next week if I am in office). thanks Additional Follow-up by: Willette Cluster NP,  November 28, 2010 6:25 PM    Additional Follow-up for Phone Call Additional follow up Details #2::    I spoke to the pt and put her on with you for 12-12-2010.  Dr Ellis Parents office is faxing me his last office note.  Follow-up by: Joselyn Glassman,  November 29, 2010 8:58 AM

## 2011-01-26 NOTE — Medication Information (Signed)
Summary: Omeprazole approved/SilverScript  Omeprazole approved/SilverScript   Imported By: Sherian Rein 08/17/2010 13:26:42  _____________________________________________________________________  External Attachment:    Type:   Image     Comment:   External Document

## 2011-01-26 NOTE — Progress Notes (Signed)
Summary: triage  Phone Note Call from Patient Call back at Home Phone [phone]   Caller: Patient Call For: Dr. [name] Reason for Call: Talk to Nurse Details for Reason: triage Summary of Call: Pt. calls complaining that her stools are very black and sometimes comes out in "balls."  Please call and advise. Initial call taken by: Schuyler Amor,  November 07, 2010 2:46 PM  Follow-up for Phone Call        Patient  c/o abdominal pain last week and she has been having dark black stools.  She also c/o constipation.  She hasn't been taking her Miralax.  She is asked to resume Miralax.  She will come in and see Willette Cluster RNP at 1:30 11/10/10. Follow-up by: Darcey Nora RN, CGRN,  November 07, 2010 4:09 PM

## 2011-01-26 NOTE — Assessment & Plan Note (Signed)
Summary: 1 mo f/u...as.   History of Present Illness Visit Type: Follow-up Visit Primary GI MD: Melvia Heaps MD [facility] Primary Provider: Lesle Chris, MD Requesting Provider: n/a Chief Complaint: Constipation, weight loss, and diarrhea  History of Present Illness:   [name] has returned for followup of her constipation and rectal bleeding.  On MiraLax she developed diarrhea.  When she reduced it to have dose (8.5gm) stools became softer but she  still had to manually remove her stools.  She is complaining of epigastric pain and she has seen jet black stools as well.  She is on omeprazole.   Colonoscopy demonstrated stercoral ulcers.   GI Review of Systems    Reports weight loss.      Denies abdominal pain, acid reflux, belching, bloating, chest pain, dysphagia with liquids, dysphagia with solids, heartburn, loss of appetite, nausea, vomiting, vomiting blood, and  weight gain.        Denies anal fissure, black tarry stools, change in bowel habit, constipation, diarrhea, diverticulosis, fecal incontinence, heme positive stool, hemorrhoids, irritable bowel syndrome, jaundice, light color stool, liver problems, rectal bleeding, and  rectal pain.    Current Medications (verified): 1)  Metoprolol Tartrate 50 Mg Tabs (Metoprolol Tartrate) .... Take One By Mouth Two Times A Day 2)  Verapamil Hcl Cr 120 Mg  Cp24 (Verapamil Hcl) .... Once Daily 3)  Gabapentin 800 Mg  Tabs (Gabapentin) .... At Bedtime 4)  Vitamin C 500 Mg  Chew (Ascorbic Acid) .... Once Daily 5)  Womens Hair, Skin & Nails   Tabs (Multiple Vitamins-Minerals) .... Once Daily 6)  Migraine Formula 250-250-65 Mg  Tabs (Aspirin-Acetaminophen-Caffeine) .... As Needed 7)  Multivitamins   Caps (Multiple Vitamin) .... Once Daily 8)  Calcium 500/d 500-200 Mg-Unit  Tabs (Calcium Carbonate-Vitamin D) .... Take Three By Mouth Once Daily 9)  Omeprazole 40 Mg  Cpdr (Omeprazole) .Marland Kitchen.. 1 Tab Daily 10)  Iron 325 (65 Fe) Mg Tabs (Ferrous Sulfate)  .... Take 1 Tablet By Mouth Two Times A Day 11)  Aspir-Low 81 Mg Tbec (Aspirin) .... Take 1 Tablet By Mouth Once A Day 12)  Tylenol 325 Mg Tabs (Acetaminophen) .... Take 1 Tablet By Mouth Two Times A Day 13)  Premarin 0.625 Mg/gm Crea (Estrogens, Conjugated) .... Apply As Directed 14)  Hydroxyzine Hcl 10 Mg Tabs (Hydroxyzine Hcl) .... Take One To Three By Mouth Once Daily 15)  Miralax  Powd (Polyethylene Glycol 3350) .... Mix 1/2 Scoop in Water and Drink Once Daily 16)  Tramadol Hcl 50 Mg Tabs (Tramadol Hcl) .... Take One By Mouth Two Times A Day 17)  Hydrocodone-Acetaminophen 5-500 Mg Tabs (Hydrocodone-Acetaminophen) .... As Needed  Allergies (verified): No Known Drug Allergies  Past History:  Past Medical History: Reviewed history from 06/17/2008 and no changes required. leg cramps Anxiety Disorder Diverticulosis Glaucoma Hypertension  Past Surgical History: Reviewed history from 06/17/2008 and no changes required. hysterectomy breast surgery Removal of index finger  Family History: Reviewed history from 08/04/2009 and no changes required. mother with breast cancer; son with a blastoma; No FH of Colon Cancer:  Social History: retired   no etoh,  no cigs, no drug use  Review of Systems       The patient complains of arthritis/joint pain and back pain.  The patient denies allergy/sinus, anemia, anxiety-new, blood in urine, breast changes/lumps, change in vision, confusion, cough, coughing up blood, depression-new, fainting, fatigue, fever, headaches-new, hearing problems, heart murmur, heart rhythm changes, itching, menstrual pain, muscle pains/cramps, night sweats, nosebleeds,  pregnancy symptoms, shortness of breath, skin rash, sleeping problems, sore throat, swelling of feet/legs, swollen lymph glands, thirst - excessive , urination - excessive , urination changes/pain, urine leakage, vision changes, and voice change.    Vital Signs:  Patient profile:   77 year old  female Height:      62 inches Weight:      112 pounds BMI:     20.56 BSA:     1.50 Pulse rate:   76 / minute Pulse rhythm:   regular BP sitting:   142 / 76  (left arm) Cuff size:   regular  Vitals Entered By: Ok Anis CMA (May 02, 2010 10:01 AM)   Impression & Recommendations:  Problem # 1:  HEMORRHAGE OF RECTUM AND ANUS (ICD-569.3)  this is due to stercoral ulcers that are result of her constipation.  Recommendations #1 reduce MiraLax to one capsule every other day  Orders: EGD (EGD)  Problem # 2:  ANEMIA, IRON DEFICIENCY (ICD-280.9) Question of melena has been raised by the patient.  It is noteworthy that she has a microscopic anemia  Recommendations #1 check CBC and BMET #2 upper endoscopy  The patient is #1 check CBC #2 upper endoscopy Orders: TLB-CMP (Comprehensive Metabolic Pnl) ([id number]) TLB-CBC Platelet - w/Differential (85025-[id number]) EGD (EGD)  Patient Instructions: 1)  Copy sent to : Brett Canales Daub,MD 2)  You will go to the basement for labs today 3)  Your Upper Endoscopy is  scheduled for 05/12/2010 at 2:30pm you will need to arrive on the 4th floor 4)  The medication list was reviewed and reconciled.  All changed / newly prescribed medications were explained.  A complete medication list was provided to the patient / caregiver.

## 2011-01-26 NOTE — Progress Notes (Signed)
Summary: Medication  Phone Note Call from Patient Call back at Home Phone [phone]   Caller: Patient Call For: Dr. [name] Reason for Call: Talk to Nurse Summary of Call: pt. needs a [facility] Initial call taken by: Karna Christmas,  August 15, 2010 8:45 AM  Follow-up for Phone Call        Refilled medication Follow-up by: Merri Ray CMA Duncan Dull),  August 15, 2010 8:55 AM    Prescriptions: OMEPRAZOLE 40 MG  CPDR (OMEPRAZOLE) one tablet by mouth two times a day  #60 x 6   Entered by:   Merri Ray CMA (AAMA)   Authorized by:   Louis Meckel MD   Signed by:   Merri Ray CMA (AAMA) on 08/15/2010   Method used:   Electronically to        [facility] (retail)       [address]       [place], [place]  [id number]       Ph: [phone]       Fax: [phone]   RxID:   [id number]

## 2011-01-26 NOTE — Progress Notes (Signed)
Summary: PRIOR AUTH  Phone Note Call from Patient   Caller: Patient Call For: [facility] Reason for Call: Talk to Nurse Summary of Call: INSURANCE/PHARMACY NEEDS PRIOR AUTHORIZATION FOR OMEPRAZOLE.    Initial call taken by: Misty Stanley,  August 15, 2010 10:43 AM  Follow-up for Phone Call        Called pt to inform that pharmacy has to fax over authorization Follow-up by: Merri Ray CMA Duncan Dull),  August 15, 2010 1:46 PM  Additional Follow-up for Phone Call Additional follow up Details #1::        Pt approved for Omeprazole 40 mg two times a day till 2039, with the Barretts Esophagus dx. Per [employer] Preauthorization Additional Follow-up by: Merri Ray CMA Duncan Dull),  August 15, 2010 2:54 PM

## 2011-02-01 ENCOUNTER — Telehealth: Payer: Self-pay | Admitting: Gastroenterology

## 2011-02-09 NOTE — Progress Notes (Signed)
Summary: Triage  Phone Note Call from Patient Call back at Home Phone [phone]   Caller: Patient Call For: Dr. [name] Reason for Call: Talk to Nurse Summary of Call: Requesting to speak directly to nurse about her constipation. Initial call taken by: Karna Christmas,  February 01, 2011 4:08 PM  Follow-up for Phone Call        Spoke with patient and she states that she is having about 3 bowel movements a day, she wonders if this is due to her miralax. Informed patient that the miralax could be causing this and she may need to try taking it as needed. Patient verbalized understanding. Follow-up by: Selinda Michaels RN,  February 02, 2011 8:57 AM

## 2011-02-10 ENCOUNTER — Telehealth: Payer: Self-pay | Admitting: Gastroenterology

## 2011-02-21 NOTE — Progress Notes (Signed)
Summary: Omeprazole request already has been approved   Phone Note Outgoing Call Call back at [phone]    Call placed by: Merri Ray CMA Duncan Dull),  February 10, 2011 3:55 PM Summary of Call: Prior Authorizathoin request was sent to the office. I contacted the number provided at [phone]. Represenitive stated that the pt already has an approval good through 12/24/2038. Contacted [facility] to inform what the insurance company said. They stated the med still would not go through. Called [phone] another number that was given to me, Have tried to call twice and have waited on hold for 25 minutes each without an answer.  Initial call taken by: Merri Ray CMA Duncan Dull),  February 10, 2011 3:59 PM  Follow-up for Phone Call        Called and spoke with Insurance company again. Pt has been approved till 2039. Insurance had to make an adjustment in the system and I also contacted [employer]..They have gotten rx to go through now. Follow-up by: Merri Ray CMA Duncan Dull),  February 14, 2011 3:18 PM

## 2011-03-12 LAB — CULTURE, BLOOD (ROUTINE X 2): Culture: NO GROWTH

## 2011-03-12 LAB — CBC
HCT: 33.9 % — ABNORMAL LOW (ref 36.0–46.0)
MCHC: 33.8 g/dL (ref 30.0–36.0)
MCV: 91.7 fL (ref 78.0–100.0)
Platelets: 349 10*3/uL (ref 150–400)
RBC: 3.7 MIL/uL — ABNORMAL LOW (ref 3.87–5.11)

## 2011-03-12 LAB — URINALYSIS, ROUTINE W REFLEX MICROSCOPIC
Glucose, UA: NEGATIVE mg/dL
Hgb urine dipstick: NEGATIVE
Urobilinogen, UA: 1 mg/dL (ref 0.0–1.0)

## 2011-03-12 LAB — DIFFERENTIAL
Basophils Absolute: 0 10*3/uL (ref 0.0–0.1)
Basophils Relative: 1 % (ref 0–1)
Monocytes Absolute: 0.5 10*3/uL (ref 0.1–1.0)
Monocytes Relative: 8 % (ref 3–12)
Neutro Abs: 4.3 10*3/uL (ref 1.7–7.7)
Neutrophils Relative %: 73 % (ref 43–77)

## 2011-03-12 LAB — POCT I-STAT, CHEM 8
Chloride: 106 mEq/L (ref 96–112)
Glucose, Bld: 83 mg/dL (ref 70–99)
HCT: 34 % — ABNORMAL LOW (ref 36.0–46.0)
Potassium: 4.2 mEq/L (ref 3.5–5.1)
TCO2: 28 mmol/L (ref 0–100)

## 2011-03-12 LAB — POCT CARDIAC MARKERS

## 2011-03-12 LAB — URINE CULTURE

## 2011-03-12 LAB — URINE MICROSCOPIC-ADD ON

## 2011-05-12 NOTE — Discharge Summary (Signed)
[place]  Patient:    [name], [name]                        MRN: [id number] Adm. Date:  [id number] Disc. Date: [id number] Attending:  Tobin Chad CC:         Lyndee Leo. Janey Greaser, M.D.  Artist Pais Mina Marble, M.D.  Rockey Situ. Flavia Shipper., M.D.   Discharge Summary  DATE OF BIRTH:  1933/03/18  DISCHARGE DIAGNOSES: 1. Left index finger flexor sheath infection. 2. Glaucoma. 3. Diarrhea. 4. Headache.  DISCHARGE MEDICATIONS: 1. Keflex 500 mg p.o. b.i.d. x 5 days. 2. Multivitamin with iron one p.o. q.d. 3. Occupres per home dosing.  CONSULTING PHYSICIANS: 1. Artist Pais Mina Marble, M.D., hand surgery. 2. Rockey Situ. Flavia Shipper., M.D. with infectious disease.  PROCEDURE:  Incision and drainage and placement of #5 pediatric feeding tube for continuous flexor sheath irrigation on July 24, 2001.  HISTORY OF PRESENT ILLNESS:  The patient is a 77 year old African-American female who hit her left hand on a door three days prior to admission.  The patient was seen at Urgent Care on July 20, 2001, and an x-ray obtained at that time was negative for fracture.  Her white blood cell count on July 20, 2001, was 13.4.  The patient was diagnosed with cellulitis and started on Augmentin 500 mg p.o. b.i.d.  The patient returned to Urgent Care two days later for recheck and the cellulitis had worsened.  The patient was sent to [facility] for further evaluation and admitted for IV antibiotics secondary to outpatient p.o. antibiotic failure.  HOSPITAL COURSE:  #1 - Left index finger flexor sheath infection.  The patient was admitted and started on IV nafcillin.  After two days of IV antibiotics the patient continued to have left index finger swelling, erythema, and pain with limited range of motion.  Hand surgery was consulted to see the patient at that time and determined that the patient likely had infectious tenosynovitis of  her left index.  The patient was taken to the OR for incision and drainage and was determined to have left index finger flexor sheath infection.  Wound cultures were obtained at that time and subsequently no growth for five days. Infectious disease saw the patient initially on August 2 and recommended changing nafcillin to IV Ancef.  The patient was continued on IV Ancef until the day of discharge when she was switched to p.o. Keflex.  The patient also received occupational therapy for her left hand during this hospital stay. Her infection improved significantly by day of discharge.  She was noted to have decreased swelling, mild tenderness, and no drainage from incision site. She also had improved range of motion.  #2 - Glaucoma.  Remained stable throughout hospital course.  The patient was continued on Timolol drops t.i.d.  #3 - Diarrhea.  The patient complained of diarrhea which was treated with Lomotil.  Diarrhea had resolved by day #3 of admission.  #4 - Headache.  The patient complained of chronic headache and neck pain during hospital stay.  The pain was well controlled with Naprosyn.  DISCHARGE INSTRUCTIONS:  The patient may resume normal activities.  There are no diet restrictions.  For wound care, the patient is to perform saline soaks three times a day.  She is to continue with hand exercises as instructed by occupational therapy.  FOLLOW-UP:  The patient is to follow up with Dr. Mina Marble  on August 8, at noon at his office.  She is to follow up with Lyndee Leo. Janey Greaser, M.D. at [facility] on August 22, 2001, at 3:10 p.m. DD:  07/29/01 TD:  08/01/01 Job: [id number]

## 2011-05-12 NOTE — Consult Note (Signed)
[name], [name]   [place]                                  MRN:  [id number]         Consultation Report                                  ATT:  [name] [name], M.D.                                   DICT: HISTORY OF PRESENT ILLNESS:  I had the pleasure to see [name] [name] today in the [facility] Emergency Room.  [name] [name] is 77 years old.  She called me earlier today with concerns regarding her left index finger.  The patient has had prior surgery by Dr. Artist Pais. [name] in regards to what appears to be purulent flexor tenosynovitis.  She underwent I&D and since that time, has been placed on antibiotics.  The patient did have a hospital course for this. Last weekend, she called Dr. Tennis Must [name], who prescribed additional antibiotics over the phone for her.  She has not seen anyone subsequently and continues to complain of index finger pain.  She states she feels the infection is now gone.  She was very concerned about this and thus after discussing her situation with her over the phone, she came into the emergency room for my evaluation.  I have reviewed her past medical history at length as well as her medicines and allergies.  PHYSICAL EXAMINATION:  VITAL SIGNS:  The patient at present time is noted to be afebrile with stable vital signs.  EXTREMITIES:  On examination, her left index finger has some erythema about it, however, there are no Kanavels signs.  The patient does have intact FDP and FDS function although excursion is limited secondary to some slight residual swelling.  The patient does not hold her finger in a flexed posture at rest.  She is nontender on passive extension and is fairly nontender with deep palpation over the flexor sheath system.  I do not see any frank signs of purulent flexor tenosynovitis.  The patient also does not have any obvious localized abscess in the soft tissues to my palpation.   Overall today, she appears to be stable, although there is some generalized redness to the finger and palm region.  Her prior incision does not have any obvious dehiscence or excessive pain.  There are no signs of compression neuropathy or compartment syndrome.  X-RAY FINDINGS:  No x-rays are taken today.   IMPRESSION:  Greater than two weeks status post incision and drainage of what appears to be purulent flexor tenosynovitis.  PLAN:  I have discussed with [name] [name] that in my opinion she certainly should continue her continued therapeutic efforts in regards to FDP and FDS isolation exercises and edema control measures.  There is some generalized redness, however, no signs of abscess.  I thus placed her on Augmentin 875 mg for 10 days, one p.o. b.i.d.  She is going to follow up with Dr. Mina [name] as scheduled next week.  I have discussed with her all issues at length.  It was a pleasure to see her today. DD:  08/10/01 TD:  08/12/01 Job: [id number]

## 2011-05-12 NOTE — Op Note (Signed)
[name], [name]                 ACCOUNT NO.:  [id number]   MEDICAL RECORD NO.:  [id number]          PATIENT TYPE:  INP   LOCATION:  1608                         FACILITY:  [facility]   PHYSICIAN:  Dionne Ano. Gramig III, M.D.DATE OF BIRTH:  12/06/1933   DATE OF PROCEDURE:  12/07/2006  DATE OF DISCHARGE:                               OPERATIVE REPORT   PREOPERATIVE DIAGNOSIS:  Status post multiple incision and drainages,  secondary to necrotizing fasciitis.   POSTOPERATIVE DIAGNOSIS:  Status post multiple incision and drainages,  secondary to necrotizing fasciitis.   HISTORY:  The patient presents for possible index ray amputation and  closure rotation flap.  Thos patient has underwent previous dorsal and  volar tenosynovectomy with I&D fascial release and aggressive management  of a complex wound.   PROCEDURE:  1. I&D right hand dorsal wound.  This was an I&D of skin, subcutaneous      tissue, tendon and bone; and was an excisional debridement.  2. I&D right hand volar open guillotine amputation site, including      muscles, skin, subcutaneous tissue, tendon and bone.  3. Ray amputation of right index finger, metacarpal ray.  4. Bilateral neurectomies of index finger digital nerves, with burying      of the nerve stumps into the abductor musculature.  5. Complex web closure with dorsal rotation flap.   SURGEON:  Dionne Ano. Amanda Pea, M.D.   ASSISTANT:  Karie Chimera, P.A.-C.   ANESTHESIA:  General.   COMPLICATIONS:  None.   DRAINS:  One Penrose drain was placed.   INDICATIONS FOR PROCEDURE:  The patient presents for repeat I&D and the  above-mentioned procedure.  With understanding and accepting the risks  and benefits of  surgery.   OPERATION:  __________ anesthesia, prepped in the holding area. Permit  was signed, the arm was marked.  The patient then underwent a thorough  prep and drape with Betadine scrub and paint about the right upper  extremity. after general  anesthesia was induced.  The vacuum-assisted  closure devices had previously been placed, and all tissues looked very  well and suitable for ray amputation and closure.  At this time I then  prepped and draped with Betadine scrub and paint, and once this was  complete performed elevation of the tourniquet.  I&D of skin,  subcutaneous tissue, muscle, tendon and bone was accomplished about the  dorsal wound.  Excision of the dorsal interosseous muscle as necessary  was accomplished, and the bone, muscle, tendon, skin and subcutaneous  tissue were I&D'd  with removal of the margin of skin tissue dorsally.   Once this was done, I&D of the volar open guillotine amputation site was  accomplished as well.  This was an I&D of skin, subcutaneous tissue,  bone, the tendon and nerve tissue.  The nerve stumps were identified and  intact.  Once this done, greater than 3-4 liters of saline were placed  in the wound copiously.  All devitalized skin tissue was removed to my  satisfaction without difficulty.   Once this was done, I then isolated the index  metacarpal and formed a  ray amputation with the oscillating saw of the patient's metacarpal.  The metacarpal was removed distally, taking care to preserve  neurovascular structures.  This index ray amputation was accomplished  without  difficulty.  Following this, I retrieved the nerves to the  index finger, both proper and digital nerves, ulnar and radial were  identified and stuffed into the abductor musculature.  This was a  burying technique for the radial and ulnar digital nerves.  Once the  nerves were buried and the __________ were performed, I then performed  complex web space closure with a rotation flap.  This was done by  incising the skin in appropriate fashion and turning in rotation style  the dorsal skin into the volar depths of the wound, and allowing the  dorsal tissue to close primarily.  This was done without difficulty.  This was  inset with 3-0 Prolene with the tourniquet down and a Penrose  drain was placed.  This was a complex closure with rotation flap.   Following this, the patient had the skin cleansed.  Superficial skin  debridement was accomplished about the fingers, forearm and hand --  where de-epithelialized tissue was noted.  Following this, the patient  was placed in a sterile dressing of Xeroform and Neosporin gauze, Kerlix  and Webril with volar plaster slab.  The patient tolerated this well and  there were no complicating features.  Following this she was extubated  and taken to recovery room to be monitored.  We will continue IV  antibiotics and general observation, and hopefully this will be the last  washout.  I have discussed with her these issues at length and all  questions have been encouraged and answered.           ______________________________  Dionne Ano. Everlene Other, M.D.     Nash Mantis  D:  12/07/2006  T:  12/08/2006  Job:  [id number]

## 2011-05-12 NOTE — Op Note (Signed)
NAMEDEBBY, CLYNE                 ACCOUNT NO.:  [id number]   MEDICAL RECORD NO.:  [id number]          PATIENT TYPE:  INP   LOCATION:  1608                         FACILITY:  [facility]   PHYSICIAN:  Dionne Ano. Gramig III, M.D.DATE OF BIRTH:  08-27-1933   DATE OF PROCEDURE:  12/06/2006  DATE OF DISCHARGE:                               OPERATIVE REPORT   PREOPERATIVE DIAGNOSIS:  Deep abscess with necrotizing fasciitis  features, right hand, requiring amputation of the index finger and  incision and drainage of the hand, palm, and dorsal and volar forearm  apparatus with multiple debridements and incision and drainages  performed thus far.  The patient presents for repeat incision and  drainage, tenosynovectomy, repair, reconstruction as necessary.   POSTOPERATIVE DIAGNOSIS:  Deep abscess with necrotizing fasciitis  features, right hand, requiring amputation of the index finger and  incision and drainage of the hand, palm, and dorsal and volar forearm  apparatus with multiple debridements and incision and drainages  performed thus far.  The patient presents for repeat incision and  drainage, tenosynovectomy, repair, reconstruction as necessary.   OPERATION PERFORMED:  1. Incision and drainage skin, subcutaneous tissue, muscle and tendon      tissue, dorsal forearm and wrist.  2. Radical tenosynovectomy, dorsal extensor apparatus with closure of      the dorsal forearm/wrist wound.  3. Incision and drainage of skin and subcutaneous tissue, tendon and      muscle tissue, volar forearm.  This was excisional debridement.  4. Extensive tenosynovectomy of flexor apparatus forearm and wrist.      This was a radical tenosynovectomy of a large amount of      inflammatory tissue.  5. Closure 6 inch incision volar forearm to include the palm and      forearm apparatus.  6. Incision and drainage dorsal web space right hand, skin and      subcutaneous tissue and muscle.  This was excisional  debridement      and devitalized tissue was removed as necessary.  7. Incision and drainage volar index finger, open guillotine      amputation site with excision of skin and subcutaneous tissue,      muscle, tendon and periosteal tissue.  8. Vacuum assisted closure device placement, right index finger open      guillotine amputation site and dorsal first webspace site.  9. External neurolysis of median nerve.   SURGEON:  Dionne Ano. Amanda Pea, M.D.   ASSISTANT:  Karie Chimera, P.A.-C.   ANESTHESIA:  General.   COMPLICATIONS:  None.   TOURNIQUET TIME:  Less than an hour.   ESTIMATED BLOOD LOSS:   INDICATIONS FOR PROCEDURE:  The patient is a 77 year old female who  unfortunately has dealt with a horrendous infection over the last week.  She presented six days ago and was immediately taken to the operative  suite for open guillotine amputation, decompressive fasciotomy and  incision and drainage of the dorsal and volar forearm.  She has required  repeated incision and drainage and tenosynovectomy.  The patient has  gone from a picture of frank  sepsis and poor prognosis in general  including life and limb, to one of improved stability.  She presents  today for repeat washout and the interventions described above  understanding and accepting the risks and benefits.   DESCRIPTION OF PROCEDURE:  The patient was seen by myself and  Anesthesia, consented, permit was signed, extremity was marked and she  was taken to the operating suite, underwent smooth induction of general  anesthesia.  Following this, the wound/bandage was carefully removed  without difficulty.  Once this was performed, I then prepped and draped  in the usual sterile fashion with Betadine scrub and paint.  Following  securing sterile field, we then performed incision and drainage of  multiple areas.  The patient underwent incision and drainage of the  dorsal forearm and wrist.  Skin and subcutaneous tissue, tendon  and  muscle tissue were debrided as necessary.  Following this, I then  sequentially performed tenosynovectomy of the extensor apparatus.  This  was a radical tenosynovectomy, a large amount of serosanguineous type  fluid was removed.  Proteinaceous exudate was removed from the tendon  and this allowed for a nice inspection of the tissue which looked  excellent at this juncture, now nearly one week status post the first  I&D.  I of course should note that this is her fourth I&D in the  operative suite.  Once this was performed and greater than 2 to 3 L of  irrigation was applied to this area, I closed it loosely with far-near-  near-far stitches.  Skin approximated reasonably well.   Once this was done, we then turned attention towards the volar forearm  where the patient underwent incision and drainage of skin and  subcutaneous tissue, muscle and tendinous tissue.  The fascia looked  excellent with no signs of infection ascending further up the arm and  copious amounts of irrigation (2 to 3 L) were placed in this region.  Following this, an extensive tenosynovectomy was performed and an  external neurolysis of the median nerve was performed.  The external  neurolysis allowed for the median nerve to be freed up from adhesions  created by the infectious process.  A radical tenosynovectomy was  performed, tendons were decompressed.  They all glided well and there  were no signs of ascending infection.  At this juncture, I then  performed careful inspection of all the carpal canal contents and  performed irrigation.  With a radical tenosynovectomy of the forearm and  carpal tunnel apparatus performed, this decompressed the area nicely and  I was able to close the wound primarily after the irrigation.  I should  note that the patient underwent radical tenosynovectomy, incision and  drainage, external median nerve neurolysis and loose closure here.  Following this, attention to the first  dorsal web was addressed.  The  first dorsal web underwent incision and drainage of skin and  subcutaneous, muscle, tendon and devitalized tissue.  Curette, sharp  knife blade and tissue stripper were used to debride the areas  aggressively.  I debrided until the muscle was healthy in appearance.  Following this, I then turned attention towards the volar open index  guillotine amputation __________ I&D skin, subcutaneous tissue, muscle  and tendon was also accomplished.  This was a separate wound, of course.  This separate wound was I&D'd aggressively and the palmar space was  aggressively addressed and I&D'd as well.  Following this, greater than  3 L of irrigant was placed through the dorsal  and volar wound to my  satisfaction.  Once this was done, I then placed the vacuum assisted  closure device about dorsal and volar wounds.  This was hooked up to  continuous 75 mm suction no the vacuum assisted closure device system.  The patient tolerated this well.  Following this, sterile dressing was  applied.  A short arm splint was placed and she was taken to the  recovery room.   She will be continued as an inpatient in the hospital.  I feel that she  will need a repeat I&D Friday which will be 48 hours from now.  At that  time if conditions look well, I would consider ray amputation and loose  closure of the first dorsal web and volar open guillotine amputation  site.  We will continue to monitor her carefully.  Her white blood  cell count has normalized, her hematocrit has remained stable and she  actually looked much, much improved from her septic appearance and  presentation initially.  We will continue all aggressive efforts to save  life and limb and proceed accordingly.  It has been a pleasure to see  her today.  All questions have been encouraged and answered.           ______________________________  Dionne Ano. Everlene Other, M.D.     Nash Mantis  D:  12/06/2006  T:  12/06/2006   Job:  [id number]

## 2011-05-12 NOTE — Discharge Summary (Signed)
[name], [name]                 ACCOUNT NO.:  [id number]   MEDICAL RECORD NO.:  [id number]          PATIENT TYPE:  INP   LOCATION:  1608                         FACILITY:  [facility]   PHYSICIAN:  Andres Shad. Rudean Curt, MD     DATE OF BIRTH:  08-11-1933   DATE OF ADMISSION:  11/29/2006  DATE OF DISCHARGE:  12/12/2006                               DISCHARGE SUMMARY   DISCHARGE DIAGNOSES:  Necrotizing fasciitis of the right hand.   PROCEDURE:  Ray amputation of the right index finger as well as multiple  incisions and drainages and debridements of the right hand and forearm.  These were performed by Dr. Dominica Severin and his assistant.   DISCHARGE MEDICATIONS:  1. Penicillin VK 1.5 g q.i.d. for 4 weeks.  2. Darvocet 1-2 tablets p.o. q. 4 h. p.r.n.  3. Neurontin 800 mg p.o. nightly.  4. Toprol XL 50 mg p.o. daily.   SUMMARY OF HOSPITALIZATION:  [name] is a 77 year old female with a  past medical history notable for hypertension, who was brought to the  emergency department on November 29, 2006, by a friend.  Her chief  complaint was altered mental status and swelling of her right hand.  She  was found to have an initial blood pressure of 97/63 and a heart rate of  138.  Her temperature was 100.2 and her exam was significant for a  laceration on the base of her right index finger on the palmar surface  with a significant soft tissue swelling and cellulitis over the forearm.  The skin was described as having a dusky appearance with questionable  viability.  Laboratory studies were notable for an initial white blood  cell count of 36,000.  The patient sustained an injury to her right hand  on a can opener the day before admission.  She was taken to the  operating room by Dr. Amanda Pea where a ray amputation of the right index  finger was performed as well as a dissection of the right forearm and  hand which revealed purulent material and necrotic tissue.  Cultures  were obtained and the patient  was treated with broad-spectrum  antibiotics, Vancomycin and Zosyn.  She required a total of 5 trips to  the operating room for further debridement and then final wound closure  which took place on December 14.  The patient clinically improved after  the initial trip to the operating room.  Her culture grew beta  streptococcus on multiple occasions.  For this reason, when she had  clinically stabilized, her antibiotic therapy was targeted towards this  organism with high-dose penicillin G plus clindamycin.  There were no  major complications during the hospitalization.  She was attended to by  occupational therapy as well as the wound care service where dressings  were changed daily and she was followed closely by Dr. Amanda Pea on a daily  basis.  She had occasional episodes of hypertension but, otherwise,  remained entirely stable.  One finding of note at the time of discharge,  the patient's platelet count had increased to approximately 700,000,  however, she remained well clinically.  She did not have fever or  leukocytosis and Dr. Amanda Pea was pleased with the appearance of her wound  as we collectively felt that her thrombocytosis was secondary to the  inflammatory reaction from her wound healing process.  Her surgical  wound was sutured loosely for continued drainage and it was decided that  the patient could go home on December 19 with Home Health Care services  placed.   FINAL DIAGNOSES:  Necrotizing fasciitis.   PLAN:  1. Continue penicillin VQ by mouth at a high dose 1.5 g q. 6 h. for 4      weeks.  2. The patient will receive daily dressing changes at home.  3. The patient will receive occupational therapy both at home and in      consultation with Dr. Amanda Pea.  4. The patient will follow up with Dr. Amanda Pea closely in his office.  5. Because the patient has very social supports, except for one close      friend and a son who is unreliable, we have arranged for some home       services including home nursing, home social work, and [employer]      [employer].  6. The patient will be given Darvocet for pain control.  Her pain has      been adequately controlled with this medication.      Andres Shad. Rudean Curt, MD  Electronically Signed     PML/MEDQ  D:  12/12/2006  T:  12/12/2006  Job:  [id number]   cc:   Dionne Ano. Everlene Other, M.D.  Fax: [phone]

## 2011-05-12 NOTE — Op Note (Signed)
[name], [name]                 ACCOUNT NO.:  [id number]   MEDICAL RECORD NO.:  [id number]          PATIENT TYPE:  INP   LOCATION:  1608                         FACILITY:  [facility] [place]   PHYSICIAN:  Dionne Ano. Gramig III, M.D.DATE OF BIRTH:  12/23/1933   DATE OF PROCEDURE:  12/01/2006  DATE OF DISCHARGE:                               OPERATIVE REPORT   PREOPERATIVE DIAGNOSIS:  Status post incision and drainage and index  finger amputation, right hand, secondary to necrotizing fasciitis.  This  patient also underwent incision and drainage of the dorsal and volar  forearm including carpal tunnel and flexor contents of the wrist and  forearm.   POSTOPERATIVE DIAGNOSIS:  Status post incision and drainage and index  finger amputation, right hand, secondary to necrotizing fasciitis.  This  patient also underwent incision and drainage of the dorsal and volar  forearm including carpal tunnel and flexor contents of the wrist and  forearm.   PROCEDURE:  1. Repeat incision and drainage right open the MCP joint region with      removal of nonviable tissue.  This was an excisional debridement of      skin, subcutaneous tissue and muscle about the index finger open      guillotine amputation site.  2. Incision and drainage extensor apparatus and extensor tendon      tenosynovectomy at the forearm wrist level through a separate      incision from previous incision and drainage.  3. Incision and drainage and flexor tenosynovectomy volar forearm and      wrist through a separate incision.  4. Exploration mid-carpal space which looked viable without necrosis.   SURGEON:  Dionne Ano. Amanda Pea, M.D.   ASSISTANT:  None.   COMPLICATIONS:  None.   ANESTHESIA:  General.   TOURNIQUET TIME:  None.   ESTIMATED BLOOD LOSS:  Less than 70 mL.   DRAINS:  Multiple Penrose drains were placed and the wound was left  open.   INDICATIONS FOR PROCEDURE:  This patient is a 77 year old female who  presented  less than 48 hours ago with a necrotizing fasciitis requiring  open guillotine amputation of the index finger as well as dorsal and  volar I&D about the forearm with flexor and extensor tenosynovectomy and  inspection of the fascia.  The patient had an initial white count of 36  and this is now down to 21.  She has been on the Zosyn and vancomycin.  She has grown out Streptococcus A species.  We await the final cultures.  She presents for repeat I&D.   OPERATION IN DETAIL:  The patient was taken to the operating suite.  She  was consented, extremity marked, and all questions were encouraged and  answered.  In the operative suite, she underwent a general anesthetic.  She was then laid supine, appropriately padded, prepped and draped in  the usual sterile fashion about the right upper extremity. Penrose  drains from prior I&D and splint were removed by myself. Overall her  swelling decreased.  There was no more ascending erythema and the  patient's ascension seems  to have stopped in terms of the infectious  process.  I very carefully inspected all areas and following this,  performed thorough Betadine scrub and paint.  Once she was prepped, the  patient then underwent I&D about the open guillotine index finger  amputation site.  I&D of skin, subcutaneous tissue, muscle tendon  tissue, and the MCP bone was accomplished.  This was an excisional  debridement.  Copious amounts of saline were placed in the wound.  The  muscle looked viable and healthy about the thenar region.  Following  this, I then turned attention towards the mid palmar space.   I explored the mid palmar space. The abductor was intact without  necrosis. The patient had no evidence of advanced extension into the mid  palm region.  This was explored thoroughly.   Following this, I then performed a flexor tenosynovectomy and I&D of the  flexor contents.  This was done in the forearm and wrist.  The median  nerve was carefully  protected. The area was left completely open.  Following this, I then turned attention towards the flexor tendons and  meticulously removed nonviable tissue which had accumulated in the  region.  This was lavaged copiously.  Following this, I then turned  attention towards the extensor apparatus.  I once again made sure that  the fascia was completely released and inspected tendons and performed a  tenosynovectomy at the extensor apparatus of the fourth dorsal  compartment.  This was done without difficulty.   Following this, 6 liters of fluid were placed through all wounds.  There  were two large dorsal wounds, one large volar wound about the wrist and  forearm, and the open guillotine amputation site for the initial  procedure index amputation.  Greater than 6 liters of fluid were placed  through all areas. Good bleeding tissue and contractile muscle was noted  in all areas.   Following, Penrose drains x4 were placed about wounds which were left  completely open. Xeroform followed by gauze, Webril, Kerlix were placed.  She tolerated this well.  A volar plaster splint was applied.   She will be continued on IV antibiotics, observation, and we will  monitor her condition quite closely.  I have discussed with the patient  the do's and do not's, etc., and will plan for continued aggressive  wound care.  She understands she will need to return to the operating  suite in another 36 to 48 hours for repeat I&D.  I do feel that this  will likely require multiple I&Ds prior to attempted loose closure if  the infection continues to improve.  Overall conditions were favorable  today, but certainly she still has some major issues in terms of  restoration to quiescence.  All questions have been encouraged and  answered.           ______________________________  Dionne Ano. Everlene Other, M.D.    Nash Mantis  D:  12/01/2006  T:  12/01/2006  Job:  [id number]

## 2011-05-12 NOTE — Op Note (Signed)
[name], [name]                 ACCOUNT NO.:  [id number]   MEDICAL RECORD NO.:  [id number]          PATIENT TYPE:  INP   LOCATION:  1608                         FACILITY:  [facility]   PHYSICIAN:  Dionne Ano. Gramig III, M.D.DATE OF BIRTH:  [birth year]   DATE OF PROCEDURE:  11/29/2006  DATE OF DISCHARGE:                               OPERATIVE REPORT   PREOPERATIVE DIAGNOSIS:  Abscess, deep in nature, right hand, involving  the index finger which is necrotic and purulent. This infection is  ascending rapidly with noted poor function of the hand and extensive  invasion.  Cannot rule out necrotizing fasciitis.   POSTOPERATIVE DIAGNOSES:  Abscess, deep in nature, right hand, involving  the index finger which is necrotic and purulent. This infection is  ascending rapidly with noted poor function of the hand and extensive  invasion.  Cannot rule out necrotizing fasciitis.  Noted necrotic right  index finger and stable fascia about the forearm but a rather aggressive  and infiltrated abscess into the thenar region, extensor tendon  apparatus, flexor apparatus and index finger as well as soft tissue  throughout the palmar hand.   SURGICAL PROCEDURE PERFORMED:  1. I&D (irrigation and debridement) deep abscess thenar space right      hand.  2. I&D (irrigation and debridement) necrotic right index finger with      amputation of the index finger at the metacarpophalangeal joint      level, including bilateral neurectomies.  3. Extensive radical tenosynovectomy about the extensor apparatus      right hand and forearm secondary to infiltrative infectious      process.  4. Open carpal tunnel release with flexor tenosynovectomy  5. Fasciotomy volar and dorsal forearms with inspection of the fascia      which was intact in the forearm without necrosis.   SURGEON:  Dionne Ano. Amanda Pea, M.D.   ASSISTANT:  None.   COMPLICATIONS:  None.   SPECIMENS:  Necrotic index finger sent to path for gross  identification.   ANESTHESIA:  General.   ESTIMATED BLOOD LOSS:  Minimal.   DRAINS:  Multiple Penrose drains.   INDICATIONS FOR PROCEDURE:  The patient is a 77 year old female who  presented to the emergency room.  I was called and saw her acutely at  approximately 7:00 to 7:30 pm.  It was readily apparent that she had a  significant infectious process about the hand and index finger.  She had  a necrotic right index finger with poor refill.  In addition to this she  had no movement per say to the fingers.  She had signs of sepsis.  Cultures were taken in the form of blood cultures and I asked them to  hold antibiotics until the I&D was performed.  It was absolutely  apparent that she needed to go to the operating room swiftly for I&D of  the abscess.  I have discussed with she and her friends and family  member [name]) her upper extremity predicament.  They all desired  to proceed accordingly.   The patient states that she cut her  finger on a can 24 hours ago,  however certainly her process is a bit more aggressive than what is  typically described as a wound less than 24 hours old.  The patient had  somewhat poor description of the events as her mentation was somewhat  poor in terms of her baseline.  She certainly was showing and developing  signs of sepsis.  The infection was acutely ascending and extremely  worrisome.   I discussed with the family the need for probable amputation and our  effort would be to save her hand and prevent her from sepsis which she  could potentially succumb to.  They understood the gravity and desired  to proceed.   OPERATION IN DETAIL:  The patient met with myself and Dr. Council Mechanic of  anesthesia.  She was taken to the operative suite.  Extremity was  marked.  Consent was signed and she underwent a general anesthesia once  in the operative suite.  She was prepped and draped in the usual sterile  fashion with Betadine scrub and paint.  I then  performed elevation of  the tourniquet and did inspection of the finger.  The patient had the  operation commenced with identification of the index finger which was  necrotic.  With the tourniquet down I performed an incision revealing no  blood flow to the tip of the finger.  The patient had necrosis and frank  purulence.  I took initial superficial cultures over blisters and blebs  and following this incised in a Brunner fashion the area about the  finger.  The patient had marked infiltrative purulence.  This  infiltrated the skin, subcu, flexor tendon sheath and encompassed the  dorsal and volar regions of the finger.  This was extremely impressive  and completely infiltrative.  At this time it was very apparent that an  amputation of the index finger would have to occur and thus we performed  an MCP level amputation, including bilateral neurectomy.  I tagged the  radial and ulnar digital nerves with a 4-0 Prolene for later revision  once the infectious process is under control.  Specimen was sent for  gross identification only.  The patient had the flexor and extensor  apparatus identified and it was noted that the flexor and extensor  apparatus had significant infiltrative purulence.  At this juncture, I  would absolutely necessary to explore the extensor and flexor surfaces.   The patient underwent incision about the palm and the palmar fascia was  incised.  This fascia was intact.  I then performed an open carpal  tunnel release.  There was no advanced purulence in the carpal tunnel.  There was noted tenosynovitis and a tenosynovectomy was performed taking  care to preserve the median nerve.  The patient then underwent  identification of the thenar region and extensor region.  The patient  had a large thenar abscess.  The thenar musculature was identified.  Dorsal interosseous muscle was partially resected and nonviable muscle was debrided until adequate viability was ascertained.   Following this,  I inflated the tourniquet for a brief period of time and dissected about  the extensor apparatus.  I followed the sheath and a large amount of  fluid in the extensor apparatus.  I opened the extensor apparatus in  multiple areas and performed a radical tenosynovectomy about the  extensor apparatus where marked fluid and early infectious process was  noted.   Thus, a radical extensor tenosynovectomy was performed without  difficulty  about the dorsal aspect of the hand.   Following this, I then performed dorsal and volar fasciotomies.  I  performed complete inspection of the fascia about the volar forearm and  dorsal forearm.  This fascia was without advanced necrosis although it  will be watched closely.  Fasciotomy allowed for release of the  musculature apparatus.  Once this was done, I then deflated the  tourniquet which was up for a brief period of time, made sure all  muscles was contracted.  There was no significant purulence left.   I should note the patient did have dissection carried into the mid  palmar region and this area was stable, however, I did place a Penrose  drain at the conclusion of the case in the mid palm region.   Following this, greater than 6 liters of fluid was placed in the wound.  This was lavaged aggressively about the carpal tunnel site, extensor  site, fasciotomy site, index finger site which was left completely open  and the thenar site.  There were large incisions used and following  irrigation refill was noted to be in good repair about the middle, ring  and small fingers as well as to the thumb.  I then placed multiple  Penrose drains and a combination of Xeroform and Adaptic over the areas  with gauze, Kerlix, Webril and a volar plaster splint.  She was taken to  recovery room.  She will be monitored closely.  We will plan for IV  antibiotics in the form of vancomycin and Zosyn.  She was given a dose  of vancomycin 500 mg in the  operative suite after cultures were taken  and was given 3 grams of Unasyn at that time.  She tolerated the  procedure well and there were no complicating features.   I have asked medicine to see her in consultation for her medical needs.  She is quite septic with a white count of 36,000.  She will be monitored  closely and we will do everything in our efforts to try and save her  hand and prevent further development of worsening sepsis.   This is a very severe infection.  I would give this a guarded prognosis  at this juncture given all issues.  I have spoken with general medicine,  Dr. Julio Sicks, in regards to her condition and plans and he was nice  enough to consult on  her behalf.           ______________________________  Dionne Ano. Everlene Other, M.D.     Nash Mantis  D:  11/29/2006  T:  11/30/2006  Job:  [id number]   cc:   Dionne Ano. Everlene Other, M.D.  Fax: [phone]

## 2011-05-12 NOTE — Cardiovascular Report (Signed)
[place]. [facility]  Patient:    [name], [name] Visit Number: [id number] MRN: [id number]          Service Type: EMS Location: [facility] Attending Physician:  Nelia Shi Dictated by:   Noralyn Pick Eden Emms, M.D., [facility] Proc. Date: 05/21/02 Admit Date:  05/11/2002 Discharge Date: 05/11/2002   CC:         Fayrene Fearing U. Meryl Crutch, M.D.   Cardiac Catheterization  PROCEDURE:  Coronary Angiography.  CARDIOLOGIST:  Noralyn Pick. Eden Emms, M.D. [facility]  INDICATION:  Abnormal Cardiolite study, PACs, PVCs, and chest pain.  DESCRIPTION OF PROCEDURE:  The catheterization was done for the right femoral artery.  Judkins catheters were used.  RESULTS:  The left main coronary artery was normal.  Left anterior descending artery was normal.  First and second diagonal branches were normal.  The circumflex coronary artery was nondominant and was normal.  The right coronary artery was dominant.  There was a 20 to 30% lesion in the proximal portion.  The remainder of the vessel was normal.  RAO VENTRICULOGRAPHY:  RAO ventriculography was normal.  Ejection fraction was 60%.  There was no gradient across the aortic valve and no MR. LV pressure was 124/9.  Aortic pressure was 124/72.  IMPRESSION:  The patient does not have significant coronary artery disease. Cardiolite would appear to be a false positive.  Her ejection fraction is normal.  She will be discharged later today to follow up with primary care. Dictated by:   Noralyn Pick Eden Emms, M.D., [facility] Attending Physician:  Nelia Shi DD:  05/21/02 TD:  05/22/02 Job: [id number]

## 2011-05-12 NOTE — Op Note (Signed)
[place]. [facility]  Patient:    ARDEN, AXON                        MRN: [id number] Proc. Date: 07/24/01 Adm. Date:  [id number] Attending:  Tobin Chad                           Operative Report  PREOPERATIVE DIAGNOSIS:  Left index finger flexor sheath infection.  POSTOPERATIVE DIAGNOSIS:  Left index finger flexor sheath infection.  OPERATION:  Incision and drainage and placement of #5 pediatric feeding tube for continuous flexor sheath irrigation.  SURGEON:  Artist Pais. Mina Marble, M.D.  ASSISTANT:  None.  ANESTHESIA:  General.  TOURNIQUET TIME:  20 minutes.  COMPLICATIONS:  None.  DRAINS:  None.  DESCRIPTION OF PROCEDURE:  The patient was taken to the operating room.  After induction of adequate general anesthesia the left upper extremity was prepped and draped in the usual sterile fashion.  The arm was elevated for five minutes.  The tourniquet was then inflated to 250 mmHg.  At this point in time an incision was made in the area of the A1 pulley of the index finger. Dissection was carried down to the sheath where purulent material was identified.  This was cultured x 2 for aerobic and anaerobic bacteria.  The sheath was then identified, and the area of the A1 pulley proximal edge was isolated.  Next, a mid lateral incision was made on the ulnar side of the DIP joint of the index finger, and dissection was carried down to the sheath between the A4 and A5 pulleys.  Once this was done the feeding tube was placed in the proximal wound, and the sheath was irrigated with 500 cc of antibiotic-impregnated solution.  The wounds were then closed proximally with 4-0 nylon, left open, and packed with xeroform distally, and the catheter was left in for continuous irrigation.  It was then dressed in xeroform, 4 x 4 ______ , dorsal splint, and a compressive dressing.  The patient tolerated the procedure well and went to the recovery room in stable  fashion. DD:  07/24/01 TD:  07/24/01 Job: [id number]

## 2011-05-12 NOTE — H&P (Signed)
[place]. [facility]  Patient:    [name], [name]                        MRN: [id number] Adm. Date:  [id number] Attending:  Corlis Leak. Dictator:   Kevin Fenton, M.D.                         History and Physical  CHIEF COMPLAINT: Left hand cellulitis.  HISTORY OF PRESENT ILLNESS: This is a 77 year old African-American female, who hit her hand on a door three days ago at home.  She was seen at Urgent Care on July 20, 2001 and x-ray was negative for fracture.  WBC on July 20, 2001 was 13.4.  Diagnosed with cellulitis and started on Augmentin 500 mg p.o. b.i.d. Returned today for recheck and has worsened.  Sent to [facility] for evaluation by Dr. Rennis Chris and felt not to be a surgical emergency but the patient needs to be admitted now for IV antibiotics secondary to outpatient p.o. antibiotic failure.  PAST MEDICAL HISTORY: Negative for diabetes, hypertension, asthma, coronary artery disease, kidney disease, or liver disease.  She has a history of glaucoma.  PAST SURGICAL HISTORY:  1. Hysterectomy.  2. Benign breast lump removed.  3. Maybe had appendectomy.  MEDICATIONS:  1. Multivitamin.  2. Aspirin 81 mg q.d.  3. Iron.  4. Vitamin C.  5. Vitamin E.  ALLERGIES: No known drug allergies.  REVIEW OF SYSTEMS: Positive for hammertoes.  Negative for chest pain.  She says she gets winded going up a flight of stairs - this is not new.  No fever, chills, nausea or vomiting.  Normal bowel movements.  No blood in stool. Occasional headache.  She "gets nervous."  She does complain of some dry skin but no weight changes and says she has been told before she has an irregular heart beat.  FAMILY HISTORY: Mother died of MI.  Father died of "old age."  SOCIAL HISTORY: She lives with one son.  She is divorced.  Works at [employer].  Finished twelfth grade.  Denies tobacco or alcohol ever.  She drives and performs  ADLs.  PHYSICAL EXAMINATION:  VITAL SIGNS: Temperature 98.6 degrees, heart rate 57, respiratory rate 24. SaO2 98% on room air.  Blood pressure 153/65.  GENERAL: She is alert and talkative and, in fact, chattering on tangentially. She is in no distress.  HEENT: No bruits. TMs are pink bilaterally.  Dentures, upper, and poor dentition lower.  PERRL.  NECK: Supple.  CV: Irregularly irregular.  No murmurs, rubs, or gallops.  LUNGS: Clear to auscultation bilaterally.  ABDOMEN: Soft, nontender.  Good bowel sounds.  No hepatosplenomegaly.  EXTREMITIES: Bilateral hammertoes.  Feet are dirty and wet.  She has fungal toenails.  DP pulses 1+ bilaterally.  No skin breakdown.  On the left palmar surface she has erythema and edema of the left index finger extending into the palm and on the left hand on the dorsum side she has edema and erythema, particularly over the PIP joint of the index finger.  She is unable to bend her finger and is unable to close it; however, she does have sensation to light touch.  LABORATORY DATA: Blood cultures x 2 pending.  WBC 6.3, hemoglobin 10.8, hematocrit 31.2, platelets 320,000; MCV 87; 72% neutrophils, 11% lymphocytes; RBC 3.57.  Sodium 140, potassium 3.2, chloride 104, bicarbonate 27, BUN 8, creatinine  0.9, glucose 98.  ASSESSMENT/PLAN: This is a 77 year old African-American female with:  1. Left index finger cellulitis, failed outpatient treatment.  Will admit     for IV antibiotics, nafcillin, and check plain films to rule out     osteomyelitis.  Patient is not septic.  She has been seen by Dr. Rennis Chris     and requires no ortho intervention at this time.  Will provide pain     control with p.o. Tylenol and Vicodin as needed.  2. Irregular heart beat.  Will check an EKG.  This is likely A fib and     long-standing per patient.  Will also check TSH.  If this is not A fib     and the patient has no other past medical history as appears above will     be  able to use aspirin 325 mg p.o. q.d. for prophylaxis.  3. Anemia with normal mean corpuscular volume.  Will heme check stools.     Ferritin is likely to be falsely elevated in light of acute infection so     will not check that now.  This probably will likely most likely to be     followed up as an outpatient as the patient is hemodynamically stable.  4. Hypokalemia.  Will replete this and check mag level.  5. Disposition.  The patient will need a primary care M.D. for follow-up.  6. Hypertension.  Will follow this here in the hospital.  Will not start any     medications at this time. DD:  07/22/01 TD:  07/23/01 Job: [id number]

## 2011-05-12 NOTE — Op Note (Signed)
[name], [name]                 ACCOUNT NO.:  [id number]   MEDICAL RECORD NO.:  [id number]          PATIENT TYPE:  INP   LOCATION:  1608                         FACILITY:  [facility]   PHYSICIAN:  Dionne Ano. Gramig III, M.D.DATE OF BIRTH:  29-Jan-1933   DATE OF PROCEDURE:  12/04/2006  DATE OF DISCHARGE:                               OPERATIVE REPORT   PREOPERATIVE DIAGNOSIS:  Status post open guillotine index finger  amputation secondary to deep abscess formation and necrotizing fasciitis  type features about the hand.  This patient has also undergone dorsal  and volar tendon tenosynovectomies as well as fascial releases,  inspection, and debridement with open carpal tunnel release.  Her wound  has been left open due to the severe infectious process.   POSTOPERATIVE DIAGNOSIS:  Status post open guillotine index finger  amputation secondary to deep abscess formation and necrotizing fasciitis  type features about the hand.  This patient has also undergone dorsal  and volar tendon tenosynovectomies as well as fascial releases,  inspection, and debridement with open carpal tunnel release.  Her wound  has been left open due to the severe infectious process.   PROCEDURE:  1. I&D open guillotine amputation site, right index finger.  This was      debridement of skin, subcutaneous tissue, muscle, tendon and was an      excisional debridement in nature.  2. I&D dorsal wounds x2, skin, subcutaneous tissue, tendon, tendon      sheath, and muscle.  This was an excisional debridement.  3. I&D volar flexor tendons and carpal tunnel site.  This was an I&D      and excisional debridement of skin, subcutaneous tissue, and      peritendinous tissue.   SURGEON:  Dr. Dominica Severin   ASSISTANT:  Karie Chimera, PA-C   COMPLICATIONS:  None.   ANESTHESIA:  General.   TOURNIQUET TIME:  0.   DRAINS:  Multiple.   INDICATIONS FOR PROCEDURE:  Ms. [name] is a 77 year old female who  presents  for repeat washout.  She presented with a white count of 36,  severely septic, and underwent I&D Thursday night, Saturday morning, and  now presents for a third I&D December 03, 2006, which is Monday.  She  understands the risks and benefits of surgery and desires to proceed  with the above-mentioned operative intervention.   OPERATION IN DETAIL:  The patient was seen by myself and anesthesia,  taken to the operative suite, underwent smooth induction of general  anesthesia, laid supine, appropriately padded, prepped and draped in  usual sterile fashion with Betadine scrub and paint.  The patient had  sterile field secured and the operation commenced with I&D of skin,  subcutaneous tissue, muscle, tendon tissue about the index finger open  guillotine amputation site.  There was no reaccumulation of purulence in  her extremity.  I&D of any nonviable tissue.  I once again explored the  mid palm space and the areas about the middle ring and small fingers  about the subcutaneous tissue regions.  There was no significant  reaccumulation of  pus.  There was still erythema present, however.  The  patient's hypothenar and thenar space were soft.  Copious amounts of  saline were placed through this area and lavaged.   Following this, the dorsal wounds x2 for I&D.  This included skin,  subcutaneous tissue, tendon, peritenon and muscular tissue.  The fascia  looked good.  There were good bleeding areas and no significant necrotic  regions.  There was no significant reaccumulation of purulence; however,  she did have some cellulitis in the soft tissue still present.   This was I&D aggressively with multiple liters of saline.   Following this, I&D the volar flexor tendons and carpal canal region.  There was no significant accumulation of purulence.  The fascia looked  healthy.  There was no obvious necrosis.  I&D of skin, subcutaneous  tissue, tendon and muscle was accomplished without difficulty.   Multiple  liters of saline were placed.   Following this, the patient had 6 Penrose drains placed in the wound  which were all left open.  Xeroform, Neosporin, gauze, and Webril as  well as Kerlix were used to cover the wound.  The patient tolerated this  well.   I will plan to return to the operating room in 36-48 hours for repeat  I&D.  We are continue aggressive salvage efforts.  I would give her a  guarded prognosis given all issues and the tremendous infection that we  have encountered.  She is growing out group A streptococcus.  She is  being followed by the medicine service as well as myself very closely.  All questions have been encouraged and answered.   She was transferred to the recovery room after sterile dressing  application and splint placement in stable condition and will be  monitored closely throughout the night.           ______________________________  Dionne Ano. Everlene Other, M.D.     Nash Mantis  D:  12/04/2006  T:  12/04/2006  Job:  [id number]

## 2011-05-12 NOTE — Consult Note (Signed)
[name], [name]                 ACCOUNT NO.:  [id number]   MEDICAL RECORD NO.:  [id number]          PATIENT TYPE:  INP   LOCATION:  1608                         FACILITY:  [facility]   PHYSICIAN:  Jackie Plum, M.D.DATE OF BIRTH:  09/10/1933   DATE OF CONSULTATION:  DATE OF DISCHARGE:                                 CONSULTATION   REQUESTING PHYSICIAN:  Dr. Amanda Pea.   REASON FOR CONSULTATION:  Aggressive management of severe leukocytosis.   Please note that the patient was seen immediately postoperatively in the  PACU and therefore the patient was sedated and could not give any  history.  History is covered from talking to Dr. Amanda Pea and Dr. Quita Skye of Emergency Medicine, respectively.  She is a 77 year old lady who  presented to the ED with mental status changes of confusion and  uncooperativeness.  She had injured her right hand 2 days ago and  thought she was cut by a sharp can.  This has been opened apparently  with a can opener.  She was said to be lethargic and confused in the  emergency room and was brought in by neighbors.  No other history was  available.  She was noted to have very erythematous and swollen right  hand with laceration, edema and purulent discharge from the flexor  aspect of the proximal index finger with distal cyanosis and decreased  capillary refill.  Orthopedist was consulted immediately and he took the  patient to the OR expeditiously.  We are consulted to see the patient on  account of critical leukocytosis and suspected sepsis.   PAST MEDICAL HISTORY:  York Spaniel to be positive for history of hypertension.   MEDICATIONS:  No other history in terms of medications is clear.   ALLERGIES:  She is said to be not allergic to any known medication.   SOCIAL HISTORY:  She is said to be a nonsmoker and nondrinker with no  history of alcohol abuse or drug abuse.  History is said to have been  gotten from the patient's friend as well as some on admission  herself in  her altered mental status according to ED doctor's documentation.   PHYSICAL EXAMINATION:  VITAL SIGNS:  Blood pressure was 124/47, pulse  115 per minute.  Blood pressure was initially 97/63 and received IV  fluid bolus in the ED with a heart rate of 138 per minute earlier.  Her  temperature was 100.2 degrees Fahrenheit initially.  Respiratory rate  was 20.  GENERAL:  She is not in acute distress.  She was sedated and under the  effect of anesthesia in immediate postoperative state when I saw her.  HEENT:  Pupils equal, round and reactive to light.  Oropharynx moist.  Scleral pallor present.  No icterus.  NECK:  Supple with no JVD.  LUNGS:  Clear to auscultation.  CARDIAC:  The patient's cardiac rhythm was regular, no gallops.  ABDOMEN:  Soft and nontender.  Bowel sounds were present.  EXTREMITIES:  Right hand and forearm were in a cast postoperatively.  She had adequate capillary refill in the right fingers.  No cyanosis  appreciated.  CNS:  The patient was sedated, but moves her extremities.  Deep tendon  reflexes were symmetrical and equal in all 4 extremities.   LABORATORY AND ACCESSORY CLINICAL DATA:  Twelve-lead EKG showed sinus  tachycardia with nonspecific ST-T wave changes.   X-ray of the hand showed ill-defined densities.   Right arm:  Marked soft tissue swelling, without any bony changes.   Chest x-ray shows ill-defined densities involving the right lung.   WBC count 36,600, hemoglobin 12.8, hematocrit 37.5, MCV 87.0, platelet  count 370,000.  Sodium 143, potassium 3.5, chloride 104, CO2 28, glucose  166, BUN 20, creatinine 1.5 and calcium 9.1.   IMPRESSION:  1. Suspected systemic inflammatory response syndrome with possible      sepsis due to traumatic laceration injury to the affected hand with      secondary infection.  2. Renal insufficiency.  3. Altered mental status secondary to diagnoses #1 and #2.  4. Severe leukocytosis.   RECOMMENDATIONS:   Start aggressive antibiotic coverage of both  vancomycin and Zosyn.  IV fluid supplementation.  Obtain blood and urine  cultures with cultures of fluid postoperatively, taken intraoperatively.  Adjust dose of antibiotic as needed.  Follow up leukocytosis.  Tetanus  toxoid already given.      Jackie Plum, M.D.  Electronically Signed     GO/MEDQ  D:  11/30/2006  T:  11/30/2006  Job:  [id number]   cc:   Dionne Ano. Everlene Other, M.D.  Fax: [phone]

## 2011-09-06 ENCOUNTER — Other Ambulatory Visit: Payer: Self-pay | Admitting: Gastroenterology

## 2011-09-20 LAB — CBC
HCT: 22.4 — ABNORMAL LOW
Hemoglobin: 7.2 — CL
MCHC: 32.2
MCV: 70.7 — ABNORMAL LOW
RBC: 3.18 — ABNORMAL LOW

## 2011-09-20 LAB — ABO/RH: ABO/RH(D): O POS

## 2011-09-20 LAB — CROSSMATCH
ABO/RH(D): O POS
Antibody Screen: NEGATIVE

## 2011-09-20 LAB — FERRITIN: Ferritin: 3 — ABNORMAL LOW (ref 10–291)

## 2011-09-20 LAB — IRON AND TIBC: Iron: 18 — ABNORMAL LOW

## 2011-10-30 ENCOUNTER — Telehealth: Payer: Self-pay | Admitting: Gastroenterology

## 2011-10-30 DIAGNOSIS — R195 Other fecal abnormalities: Secondary | ICD-10-CM

## 2011-10-30 NOTE — Telephone Encounter (Signed)
yes

## 2011-10-30 NOTE — Telephone Encounter (Signed)
Pt states that her stool is black and she has seen some bright red blood. She takes iron supplements. She would like to have her stool checked for blood. Pt is requesting some hemoccult cards. Is it ok to order and send her some cards out? Please advise.

## 2011-10-30 NOTE — Telephone Encounter (Signed)
Card mailed to pt

## 2011-11-01 ENCOUNTER — Encounter: Payer: Self-pay | Admitting: Emergency Medicine

## 2011-11-01 DIAGNOSIS — G43909 Migraine, unspecified, not intractable, without status migrainosus: Secondary | ICD-10-CM | POA: Insufficient documentation

## 2011-11-01 DIAGNOSIS — M810 Age-related osteoporosis without current pathological fracture: Secondary | ICD-10-CM | POA: Insufficient documentation

## 2011-11-01 DIAGNOSIS — M479 Spondylosis, unspecified: Secondary | ICD-10-CM | POA: Insufficient documentation

## 2011-11-01 DIAGNOSIS — B009 Herpesviral infection, unspecified: Secondary | ICD-10-CM | POA: Insufficient documentation

## 2011-11-01 DIAGNOSIS — F0390 Unspecified dementia without behavioral disturbance: Secondary | ICD-10-CM | POA: Insufficient documentation

## 2011-11-01 DIAGNOSIS — M21969 Unspecified acquired deformity of unspecified lower leg: Secondary | ICD-10-CM | POA: Insufficient documentation

## 2011-11-01 DIAGNOSIS — IMO0001 Reserved for inherently not codable concepts without codable children: Secondary | ICD-10-CM | POA: Insufficient documentation

## 2011-11-01 DIAGNOSIS — I2510 Atherosclerotic heart disease of native coronary artery without angina pectoris: Secondary | ICD-10-CM | POA: Insufficient documentation

## 2011-11-01 DIAGNOSIS — N289 Disorder of kidney and ureter, unspecified: Secondary | ICD-10-CM | POA: Insufficient documentation

## 2011-11-01 DIAGNOSIS — R739 Hyperglycemia, unspecified: Secondary | ICD-10-CM | POA: Insufficient documentation

## 2011-11-01 DIAGNOSIS — K219 Gastro-esophageal reflux disease without esophagitis: Secondary | ICD-10-CM | POA: Insufficient documentation

## 2011-11-17 ENCOUNTER — Other Ambulatory Visit: Payer: Self-pay | Admitting: Gastroenterology

## 2011-11-17 ENCOUNTER — Other Ambulatory Visit: Payer: Medicare Other

## 2011-11-17 DIAGNOSIS — Z1211 Encounter for screening for malignant neoplasm of colon: Secondary | ICD-10-CM

## 2011-11-17 LAB — HEMOCCULT SLIDES (X 3 CARDS)
Fecal Occult Blood: NEGATIVE
OCCULT 1: NEGATIVE
OCCULT 2: NEGATIVE
OCCULT 5: NEGATIVE

## 2011-11-20 ENCOUNTER — Telehealth: Payer: Self-pay | Admitting: Gastroenterology

## 2011-11-20 MED ORDER — PROCHLORPERAZINE MALEATE 10 MG PO TABS: 10.0000 mg | ORAL_TABLET | Freq: Four times a day (QID) | ORAL | Status: DC | PRN

## 2011-11-20 NOTE — Telephone Encounter (Signed)
Rx sent to pharmacy and pt aware. 

## 2011-11-20 NOTE — Telephone Encounter (Signed)
Try compazine 10mg  q6h prn.

## 2011-11-20 NOTE — Telephone Encounter (Signed)
Pt called to get her hemoccult results, they were negative. Pt is complaining of nausea and would like to have something called in for nausea. Dr. [name] please advise.

## 2012-01-08 DIAGNOSIS — R300 Dysuria: Secondary | ICD-10-CM | POA: Diagnosis not present

## 2012-01-08 DIAGNOSIS — Z01419 Encounter for gynecological examination (general) (routine) without abnormal findings: Secondary | ICD-10-CM | POA: Diagnosis not present

## 2012-01-08 DIAGNOSIS — Z124 Encounter for screening for malignant neoplasm of cervix: Secondary | ICD-10-CM | POA: Diagnosis not present

## 2012-01-09 DIAGNOSIS — H4011X0 Primary open-angle glaucoma, stage unspecified: Secondary | ICD-10-CM | POA: Diagnosis not present

## 2012-01-09 DIAGNOSIS — H04129 Dry eye syndrome of unspecified lacrimal gland: Secondary | ICD-10-CM | POA: Diagnosis not present

## 2012-01-09 DIAGNOSIS — H2510 Age-related nuclear cataract, unspecified eye: Secondary | ICD-10-CM | POA: Diagnosis not present

## 2012-01-18 DIAGNOSIS — R300 Dysuria: Secondary | ICD-10-CM | POA: Diagnosis not present

## 2012-01-18 DIAGNOSIS — Z113 Encounter for screening for infections with a predominantly sexual mode of transmission: Secondary | ICD-10-CM | POA: Diagnosis not present

## 2012-01-18 DIAGNOSIS — N760 Acute vaginitis: Secondary | ICD-10-CM | POA: Diagnosis not present

## 2012-01-31 ENCOUNTER — Telehealth: Payer: Self-pay

## 2012-01-31 NOTE — Telephone Encounter (Signed)
[name] [facility] WOULD LIKE FOR [place] TO FAX OVER PT'S LAB RESULTS FOR SYPHILIS. FAX: [phone]

## 2012-02-02 ENCOUNTER — Other Ambulatory Visit: Payer: Self-pay

## 2012-02-02 MED ORDER — METOPROLOL SUCCINATE ER 50 MG PO TB24: 50.0000 mg | ORAL_TABLET | Freq: Every day | ORAL | Status: DC

## 2012-02-16 ENCOUNTER — Encounter: Payer: Self-pay | Admitting: *Deleted

## 2012-02-24 ENCOUNTER — Telehealth: Payer: Self-pay | Admitting: *Deleted

## 2012-02-24 NOTE — Telephone Encounter (Signed)
Pt picked up Glucosamine (joint medicine) wants to know if it is okay to take it.  Call [phone]  Advised pt that it is ok to take med

## 2012-02-27 ENCOUNTER — Encounter: Payer: Self-pay | Admitting: Emergency Medicine

## 2012-02-27 ENCOUNTER — Ambulatory Visit (INDEPENDENT_AMBULATORY_CARE_PROVIDER_SITE_OTHER): Payer: Medicare Other | Admitting: Emergency Medicine

## 2012-02-27 VITALS — BP 138/76 | HR 73 | Temp 98.5°F | Resp 20 | Ht 58.5 in | Wt 122.8 lb

## 2012-02-27 DIAGNOSIS — Z8619 Personal history of other infectious and parasitic diseases: Secondary | ICD-10-CM | POA: Insufficient documentation

## 2012-02-27 DIAGNOSIS — D649 Anemia, unspecified: Secondary | ICD-10-CM | POA: Diagnosis not present

## 2012-02-27 DIAGNOSIS — N898 Other specified noninflammatory disorders of vagina: Secondary | ICD-10-CM

## 2012-02-27 DIAGNOSIS — L299 Pruritus, unspecified: Secondary | ICD-10-CM

## 2012-02-27 DIAGNOSIS — A539 Syphilis, unspecified: Secondary | ICD-10-CM

## 2012-02-27 DIAGNOSIS — A510 Primary genital syphilis: Secondary | ICD-10-CM | POA: Diagnosis not present

## 2012-02-27 NOTE — Progress Notes (Signed)
  Subjective:    Patient ID: [name], female    DOB: 07/08/1933, 78 y.o.   MRN: [id number]  HPI Leondra Cullin for general checkup. In the interim she tested positive for syphilis and received 3 IM injections of Bicillin. She states she feels well. She has no specific complaints at this time    Review of Systems noncontributory     Objective:   Physical Exam reveals an alert cooperative female who does not appear ill. Neck is supple. Chest is clear to auscultation and percussion.        Assessment & Plan:  Patient has a history of anemia. We'll go ahead and recheck this. I verify that she received 3 doses of antibiotics for her positive RPR.

## 2012-02-28 ENCOUNTER — Other Ambulatory Visit: Payer: Self-pay | Admitting: Family Medicine

## 2012-02-28 LAB — CBC
Hemoglobin: 12.6 g/dL (ref 12.0–15.0)
MCV: 85.6 fL (ref 78.0–100.0)
Platelets: 413 10*3/uL — ABNORMAL HIGH (ref 150–400)
RBC: 4.32 MIL/uL (ref 3.87–5.11)
WBC: 5.4 10*3/uL (ref 4.0–10.5)

## 2012-02-28 MED ORDER — VERAPAMIL HCL ER 180 MG PO TBCR: 180.0000 mg | EXTENDED_RELEASE_TABLET | Freq: Every day | ORAL | Status: DC

## 2012-02-29 ENCOUNTER — Telehealth: Payer: Self-pay

## 2012-02-29 NOTE — Telephone Encounter (Signed)
Calling about lab results.

## 2012-03-01 NOTE — Telephone Encounter (Signed)
Called patient, no answer.  Get ?'s... Patient seen recently for recheck CBC done in office.

## 2012-03-02 ENCOUNTER — Telehealth: Payer: Self-pay

## 2012-03-02 NOTE — Telephone Encounter (Signed)
Pt saw Dr Cleta Alberts on Tuesday. Would like her lab results.  [phone]

## 2012-03-04 DIAGNOSIS — L819 Disorder of pigmentation, unspecified: Secondary | ICD-10-CM | POA: Diagnosis not present

## 2012-03-04 DIAGNOSIS — L259 Unspecified contact dermatitis, unspecified cause: Secondary | ICD-10-CM | POA: Diagnosis not present

## 2012-03-05 ENCOUNTER — Telehealth: Payer: Self-pay

## 2012-03-05 NOTE — Telephone Encounter (Signed)
Please call and let her know her stools are negative for blood and her labs are good. Tell her to be sure her partner goes for treatment for syphilis.

## 2012-03-05 NOTE — Telephone Encounter (Signed)
Dr Cleta Alberts, pt is calling to get her lab results. I see that you have reviewed them, but don't see any comments. What would you like [place] to tell the pt?

## 2012-03-05 NOTE — Telephone Encounter (Signed)
Gave pt normal lab results and D/W her tsting and tx for syphilis for partner.

## 2012-03-05 NOTE — Telephone Encounter (Signed)
.  [facility] PT STATES SHE WAS DIAGNOSED WITH AN STD AND SINCE HER PARTNER CAN'T USE CONDOMS, IS THERE ANYTHING SHE CAN GET TO SLOW DOWN HER SEX DRIVE PLEASE CALL [phone]

## 2012-03-05 NOTE — Telephone Encounter (Signed)
Advised pt that there is nothing to do to decrease her sex drive.  Advised that she can maybe use a "toy" and pt states that she will not.  She stated that she will just not have intercourse with her partner

## 2012-03-19 NOTE — Telephone Encounter (Signed)
This patient encounter is still open on my desk top. Please review.

## 2012-03-19 NOTE — Telephone Encounter (Signed)
Pt was notified of lab results. 

## 2012-03-26 ENCOUNTER — Other Ambulatory Visit: Payer: Self-pay | Admitting: Internal Medicine

## 2012-04-05 DIAGNOSIS — M5030 Other cervical disc degeneration, unspecified cervical region: Secondary | ICD-10-CM | POA: Diagnosis not present

## 2012-04-12 DIAGNOSIS — M5137 Other intervertebral disc degeneration, lumbosacral region: Secondary | ICD-10-CM | POA: Diagnosis not present

## 2012-04-12 DIAGNOSIS — M47817 Spondylosis without myelopathy or radiculopathy, lumbosacral region: Secondary | ICD-10-CM | POA: Diagnosis not present

## 2012-04-21 ENCOUNTER — Other Ambulatory Visit: Payer: Self-pay | Admitting: Internal Medicine

## 2012-04-28 ENCOUNTER — Other Ambulatory Visit: Payer: Self-pay | Admitting: Physician Assistant

## 2012-04-29 ENCOUNTER — Other Ambulatory Visit: Payer: Self-pay | Admitting: Physician Assistant

## 2012-04-29 NOTE — Telephone Encounter (Signed)
Pt wants a refill on blood pressure medication, states she is out

## 2012-05-06 DIAGNOSIS — M5137 Other intervertebral disc degeneration, lumbosacral region: Secondary | ICD-10-CM | POA: Diagnosis not present

## 2012-05-13 DIAGNOSIS — A539 Syphilis, unspecified: Secondary | ICD-10-CM | POA: Diagnosis not present

## 2012-06-03 DIAGNOSIS — M79609 Pain in unspecified limb: Secondary | ICD-10-CM | POA: Diagnosis not present

## 2012-06-03 DIAGNOSIS — M659 Synovitis and tenosynovitis, unspecified: Secondary | ICD-10-CM | POA: Diagnosis not present

## 2012-06-25 ENCOUNTER — Other Ambulatory Visit: Payer: Self-pay | Admitting: Physician Assistant

## 2012-06-29 ENCOUNTER — Other Ambulatory Visit: Payer: Self-pay | Admitting: Physician Assistant

## 2012-07-02 ENCOUNTER — Ambulatory Visit (INDEPENDENT_AMBULATORY_CARE_PROVIDER_SITE_OTHER): Payer: Medicare Other | Admitting: Emergency Medicine

## 2012-07-02 ENCOUNTER — Encounter: Payer: Self-pay | Admitting: Emergency Medicine

## 2012-07-02 VITALS — BP 140/81 | HR 81 | Temp 97.8°F | Resp 18 | Ht <= 58 in | Wt 118.0 lb

## 2012-07-02 DIAGNOSIS — D649 Anemia, unspecified: Secondary | ICD-10-CM | POA: Diagnosis not present

## 2012-07-02 DIAGNOSIS — I10 Essential (primary) hypertension: Secondary | ICD-10-CM

## 2012-07-02 DIAGNOSIS — N390 Urinary tract infection, site not specified: Secondary | ICD-10-CM

## 2012-07-02 DIAGNOSIS — A539 Syphilis, unspecified: Secondary | ICD-10-CM | POA: Diagnosis not present

## 2012-07-02 DIAGNOSIS — R300 Dysuria: Secondary | ICD-10-CM

## 2012-07-02 LAB — CBC WITH DIFFERENTIAL/PLATELET
Eosinophils Relative: 6 % — ABNORMAL HIGH (ref 0–5)
HCT: 35.5 % — ABNORMAL LOW (ref 36.0–46.0)
Hemoglobin: 12.1 g/dL (ref 12.0–15.0)
Lymphocytes Relative: 19 % (ref 12–46)
MCV: 85.1 fL (ref 78.0–100.0)
Monocytes Absolute: 0.3 10*3/uL (ref 0.1–1.0)
Monocytes Relative: 7 % (ref 3–12)
Neutro Abs: 3.3 10*3/uL (ref 1.7–7.7)
WBC: 4.8 10*3/uL (ref 4.0–10.5)

## 2012-07-02 LAB — POCT URINALYSIS DIPSTICK
Glucose, UA: NEGATIVE
Ketones, UA: NEGATIVE
Leukocytes, UA: NEGATIVE
Protein, UA: NEGATIVE

## 2012-07-02 LAB — POCT UA - MICROSCOPIC ONLY: Crystals, Ur, HPF, POC: NEGATIVE

## 2012-07-02 MED ORDER — VERAPAMIL HCL ER 180 MG PO TBCR: 180.0000 mg | EXTENDED_RELEASE_TABLET | Freq: Every morning | ORAL | Status: DC

## 2012-07-02 NOTE — Progress Notes (Signed)
  Subjective:    Patient ID: [name], female    DOB: 1933-09-11, 78 y.o.   MRN: [id number]  HPI patient enters to get a refill on her blood pressure medication. She also has had some pain on the bottom of her right foot and is concerned about this. She has a trigger finger in one of the fingers of the hands where she had a finger amputation she also has had some itching in her vaginal area appear pertinent history reveals patient with recently completed 3 injections with Bicillin for syphilis.    Review of Systems     Objective:   Physical Exam patient is alert and cooperative and not in distress. Her neck is supple. Her chest was clear. She has a significant kyphoscoliotic curve in her back  Results for orders placed in visit on 07/02/12  POCT UA - MICROSCOPIC ONLY      Component Value Range   WBC, Ur, HPF, POC 0-12     RBC, urine, microscopic 0-3     Bacteria, U Microscopic 2+     Mucus, UA moderate     Epithelial cells, urine per micros tntc     Crystals, Ur, HPF, POC neg     Casts, Ur, LPF, POC neg     Yeast, UA neg    POCT URINALYSIS DIPSTICK      Component Value Range   Color, UA yellow     Clarity, UA clear     Glucose, UA neg     Bilirubin, UA neg     Ketones, UA neg     Spec Grav, UA 1.015     Blood, UA trace     pH, UA 5.5     Protein, UA neg     Urobilinogen, UA 0.2     Nitrite, UA positive     Leukocytes, UA Negative         Assessment & Plan:  Her urine is abnormal with pyuria however it was not a clean catch. We'll go ahead and check a urine culture if it is positive then we'll treat I did refill her blood pressure medications and I did recheck a VDRL to followup on her syphilis treatment.

## 2012-07-03 ENCOUNTER — Telehealth: Payer: Self-pay | Admitting: Gastroenterology

## 2012-07-03 DIAGNOSIS — K921 Melena: Secondary | ICD-10-CM

## 2012-07-03 LAB — RPR

## 2012-07-03 LAB — COMPREHENSIVE METABOLIC PANEL
Albumin: 3.7 g/dL (ref 3.5–5.2)
BUN: 11 mg/dL (ref 6–23)
CO2: 28 mEq/L (ref 19–32)
Calcium: 9.3 mg/dL (ref 8.4–10.5)
Chloride: 107 mEq/L (ref 96–112)
Glucose, Bld: 116 mg/dL — ABNORMAL HIGH (ref 70–99)
Potassium: 4.1 mEq/L (ref 3.5–5.3)

## 2012-07-03 NOTE — Telephone Encounter (Signed)
Pt called and wants some stool cards mailed to her. Pt thinks she has blood in her stool again. Stool cards mailed to pt.

## 2012-07-05 LAB — URINE CULTURE

## 2012-07-06 MED ORDER — CIPROFLOXACIN HCL 250 MG PO TABS: 250.0000 mg | ORAL_TABLET | Freq: Two times a day (BID) | ORAL | Status: AC

## 2012-07-06 NOTE — Addendum Note (Signed)
Addended by: Johnnette Litter on: 07/06/2012 12:01 PM   Modules accepted: Orders

## 2012-07-09 DIAGNOSIS — H4011X0 Primary open-angle glaucoma, stage unspecified: Secondary | ICD-10-CM | POA: Diagnosis not present

## 2012-07-09 DIAGNOSIS — H2510 Age-related nuclear cataract, unspecified eye: Secondary | ICD-10-CM | POA: Diagnosis not present

## 2012-07-09 DIAGNOSIS — H04129 Dry eye syndrome of unspecified lacrimal gland: Secondary | ICD-10-CM | POA: Diagnosis not present

## 2012-07-09 DIAGNOSIS — H409 Unspecified glaucoma: Secondary | ICD-10-CM | POA: Diagnosis not present

## 2012-07-11 DIAGNOSIS — M6530 Trigger finger, unspecified finger: Secondary | ICD-10-CM | POA: Diagnosis not present

## 2012-07-15 DIAGNOSIS — M7730 Calcaneal spur, unspecified foot: Secondary | ICD-10-CM | POA: Diagnosis not present

## 2012-07-15 DIAGNOSIS — M79609 Pain in unspecified limb: Secondary | ICD-10-CM | POA: Diagnosis not present

## 2012-07-15 DIAGNOSIS — L84 Corns and callosities: Secondary | ICD-10-CM | POA: Diagnosis not present

## 2012-07-16 ENCOUNTER — Telehealth: Payer: Self-pay

## 2012-07-16 DIAGNOSIS — M79673 Pain in unspecified foot: Secondary | ICD-10-CM

## 2012-07-16 NOTE — Telephone Encounter (Signed)
[name] would like somebody to call her back about her recent visit.  [phone]

## 2012-07-18 ENCOUNTER — Encounter: Payer: Self-pay | Admitting: Emergency Medicine

## 2012-07-18 NOTE — Telephone Encounter (Signed)
[place] [facility] [place]?  Does not talk with patient or give her instructions.    Likes the receptionist, prefers a doctor who will talk.  Needs one that accepts medicare and medicaid.  [phone]

## 2012-07-18 NOTE — Telephone Encounter (Signed)
Good advice.  A referral is fine but we need to find out who she is seeing now so we can refer to elsewhere.

## 2012-07-18 NOTE — Telephone Encounter (Signed)
Spoke with pt and she wanted to know ways to help her foot feel better, stated to keep it elevated and stay off her foot for couple days to alleviate the pain.  Pt states she does not like her podiatrist that she goes to now and would like a referral to a new on, if possible.  Also, wanted to know if she had an infection in her foot: gave her sxs and things to look for, advised pt to let her aide look at the area and make a decision from there, due to she can not see the bottom of her foot.   Can we make a referral for her?

## 2012-07-19 ENCOUNTER — Telehealth: Payer: Self-pay | Admitting: Gastroenterology

## 2012-07-19 LAB — HEMOCCULT SLIDES (X 3 CARDS): OCCULT 1: NEGATIVE

## 2012-07-19 NOTE — Telephone Encounter (Signed)
Sent referral in 

## 2012-07-19 NOTE — Telephone Encounter (Signed)
Spoke with pt and she is aware of results.

## 2012-07-23 NOTE — Progress Notes (Signed)
Quick Note:  Please inform the patient that that stool cards were normal. If she has persistent bleeding she should return to the office ______

## 2012-07-30 DIAGNOSIS — M722 Plantar fascial fibromatosis: Secondary | ICD-10-CM | POA: Diagnosis not present

## 2012-07-30 DIAGNOSIS — M205X9 Other deformities of toe(s) (acquired), unspecified foot: Secondary | ICD-10-CM | POA: Diagnosis not present

## 2012-07-30 DIAGNOSIS — M2010 Hallux valgus (acquired), unspecified foot: Secondary | ICD-10-CM | POA: Diagnosis not present

## 2012-08-25 ENCOUNTER — Other Ambulatory Visit: Payer: Self-pay | Admitting: Physician Assistant

## 2012-08-27 ENCOUNTER — Encounter: Payer: Self-pay | Admitting: Emergency Medicine

## 2012-08-27 ENCOUNTER — Ambulatory Visit (INDEPENDENT_AMBULATORY_CARE_PROVIDER_SITE_OTHER): Payer: Medicare Other | Admitting: Emergency Medicine

## 2012-08-27 VITALS — BP 176/80 | HR 70 | Temp 97.9°F | Resp 16 | Ht 58.5 in | Wt 118.4 lb

## 2012-08-27 DIAGNOSIS — I10 Essential (primary) hypertension: Secondary | ICD-10-CM

## 2012-08-27 DIAGNOSIS — A510 Primary genital syphilis: Secondary | ICD-10-CM

## 2012-08-27 DIAGNOSIS — R1900 Intra-abdominal and pelvic swelling, mass and lump, unspecified site: Secondary | ICD-10-CM

## 2012-08-27 DIAGNOSIS — Z0000 Encounter for general adult medical examination without abnormal findings: Secondary | ICD-10-CM

## 2012-08-27 DIAGNOSIS — R29898 Other symptoms and signs involving the musculoskeletal system: Secondary | ICD-10-CM

## 2012-08-27 DIAGNOSIS — R8281 Pyuria: Secondary | ICD-10-CM

## 2012-08-27 DIAGNOSIS — R82998 Other abnormal findings in urine: Secondary | ICD-10-CM | POA: Diagnosis not present

## 2012-08-27 DIAGNOSIS — Z202 Contact with and (suspected) exposure to infections with a predominantly sexual mode of transmission: Secondary | ICD-10-CM

## 2012-08-27 DIAGNOSIS — K921 Melena: Secondary | ICD-10-CM

## 2012-08-27 LAB — CBC WITH DIFFERENTIAL/PLATELET
Basophils Absolute: 0 10*3/uL (ref 0.0–0.1)
Basophils Relative: 1 % (ref 0–1)
Eosinophils Absolute: 0.1 10*3/uL (ref 0.0–0.7)
Eosinophils Relative: 3 % (ref 0–5)
HCT: 35.3 % — ABNORMAL LOW (ref 36.0–46.0)
Lymphocytes Relative: 20 % (ref 12–46)
MCHC: 33.7 g/dL (ref 30.0–36.0)
MCV: 86.7 fL (ref 78.0–100.0)
Monocytes Absolute: 0.4 10*3/uL (ref 0.1–1.0)
Platelets: 367 10*3/uL (ref 150–400)
RDW: 14.3 % (ref 11.5–15.5)
WBC: 5.3 10*3/uL (ref 4.0–10.5)

## 2012-08-27 LAB — POCT URINALYSIS DIPSTICK
Ketones, UA: NEGATIVE
Protein, UA: NEGATIVE
pH, UA: 5.5

## 2012-08-27 LAB — POCT UA - MICROSCOPIC ONLY
Casts, Ur, LPF, POC: NEGATIVE
Yeast, UA: NEGATIVE

## 2012-08-27 LAB — IFOBT (OCCULT BLOOD): IFOBT: POSITIVE

## 2012-08-27 MED ORDER — VERAPAMIL HCL ER 240 MG PO TBCR: 240.0000 mg | EXTENDED_RELEASE_TABLET | Freq: Every morning | ORAL | Status: DC

## 2012-08-27 NOTE — Progress Notes (Signed)
  Subjective:    Patient ID: [name], female    DOB: 17-Nov-1933, 78 y.o.   MRN: [id number]  HPI patient enters for general checkup. She continues to have significant back pain. She sees Dr. Ethelene Hal for this. She also has a GYN doctor. She was recently treated for syphilis and completed a 3 injection course. She states she has not been sexually active with this gentleman since that episode.   Review of Systems     Objective:   Physical Exam  Constitutional:       Patient is a thin female with significant kyphoscoliosis  HENT:  Head: Normocephalic.  Right Ear: External ear normal.  Left Ear: External ear normal.  Eyes: Pupils are equal, round, and reactive to light.  Neck: Normal range of motion. No tracheal deviation present. No thyromegaly present.  Cardiovascular: Normal rate, regular rhythm and normal heart sounds.  Exam reveals no gallop and no friction rub.   No murmur heard. Pulmonary/Chest: Effort normal and breath sounds normal. No respiratory distress. She has no wheezes. She has no rales.  Abdominal: Soft. She exhibits no distension. There is no tenderness. There is no rebound.       There is a slight fullness on pelvic exam in the area of the left adnexa without definite mass.  Genitourinary:       Patient has vitiligo of the introitus over both labia minora.      Results for orders placed in visit on 08/27/12  IFOBT (OCCULT BLOOD)      Component Value Range   IFOBT Positive    POCT URINALYSIS DIPSTICK      Component Value Range   Color, UA yellow     Clarity, UA slightly cloudy     Glucose, UA neg     Bilirubin, UA neg     Ketones, UA neg     Spec Grav, UA 1.015     Blood, UA neg     pH, UA 5.5     Protein, UA neg     Urobilinogen, UA 0.2     Nitrite, UA neg     Leukocytes, UA Trace    POCT UA - MICROSCOPIC ONLY      Component Value Range   WBC, Ur, HPF, POC 0-5     RBC, urine, microscopic 0-1     Bacteria, U Microscopic 1+     Mucus, UA trace     Epithelial cells, urine per micros 0-6     Crystals, Ur, HPF, POC neg     Casts, Ur, LPF, POC neg     Yeast, UA neg        Assessment & Plan:  I'm going to increase the dose of the Calan from 180 to 240 mg. I made a referral for pelvic ultrasound. I also made a referral to GI for her heme positive stool. We'll go ahead and check an HIV and syphilis titer and uriprobe

## 2012-08-28 LAB — COMPREHENSIVE METABOLIC PANEL
ALT: 9 U/L (ref 0–35)
AST: 15 U/L (ref 0–37)
Alkaline Phosphatase: 46 U/L (ref 39–117)
BUN: 10 mg/dL (ref 6–23)
Calcium: 9.4 mg/dL (ref 8.4–10.5)
Creat: 0.82 mg/dL (ref 0.50–1.10)
Total Bilirubin: 0.4 mg/dL (ref 0.3–1.2)

## 2012-08-28 LAB — RPR

## 2012-08-28 NOTE — Addendum Note (Signed)
Addended by: Cydney Ok on: 08/28/2012 11:03 AM   Modules accepted: Orders

## 2012-08-29 ENCOUNTER — Telehealth: Payer: Self-pay

## 2012-08-29 NOTE — Telephone Encounter (Signed)
Pt calling for lab results  [phone].

## 2012-08-30 ENCOUNTER — Encounter: Payer: Self-pay | Admitting: Emergency Medicine

## 2012-08-30 NOTE — Telephone Encounter (Signed)
Patients labs are in, she has blood in her stool and has been referred to GI, she is going to be home after 1 pm, I will call her back

## 2012-08-30 NOTE — Telephone Encounter (Signed)
Pt notified of labs

## 2012-09-02 ENCOUNTER — Ambulatory Visit
Admission: RE | Admit: 2012-09-02 | Discharge: 2012-09-02 | Disposition: A | Discharge status: Home / Self Care | Payer: Medicare Other | Source: Ambulatory Visit | Attending: Emergency Medicine | Admitting: Emergency Medicine

## 2012-09-02 ENCOUNTER — Telehealth: Payer: Self-pay

## 2012-09-02 DIAGNOSIS — R1900 Intra-abdominal and pelvic swelling, mass and lump, unspecified site: Secondary | ICD-10-CM

## 2012-09-02 DIAGNOSIS — N838 Other noninflammatory disorders of ovary, fallopian tube and broad ligament: Secondary | ICD-10-CM | POA: Diagnosis not present

## 2012-09-02 NOTE — Telephone Encounter (Signed)
Pt saw dr Cleta Alberts on Tuesday and had questions for dr daub. She has an ultrasound today at 3 and asks for a call before 2 to ask those questions before her appt.   bf

## 2012-09-02 NOTE — Telephone Encounter (Signed)
I have spoken to her she seems somewhat confused, I think she is asking about her ultrasound and what they are looking for, she asked twice if she has cancer. I have told her we do not know her diagnosis yet until after the US< as soon as she has the scan and we get results we will let her know.

## 2012-09-04 DIAGNOSIS — D485 Neoplasm of uncertain behavior of skin: Secondary | ICD-10-CM | POA: Diagnosis not present

## 2012-09-12 ENCOUNTER — Encounter: Payer: Self-pay | Admitting: Gastroenterology

## 2012-09-16 DIAGNOSIS — Z1231 Encounter for screening mammogram for malignant neoplasm of breast: Secondary | ICD-10-CM | POA: Diagnosis not present

## 2012-09-17 ENCOUNTER — Telehealth: Payer: Self-pay

## 2012-09-17 NOTE — Telephone Encounter (Signed)
Pt would like a referral to a "good" chiropractor.   [phone]

## 2012-09-18 NOTE — Telephone Encounter (Signed)
I spoke to patient and she wants to see a [place]. I gave her some names of chiropractors and told her to call and see if they accept medicare and medicaid. I encouraged her to come here for her back pain, but she wants to try a chiropractor first.

## 2012-09-18 NOTE — Telephone Encounter (Signed)
Patient has called again about a referral to a chiropractor. She has medicare and medicaid and would like someone who will accept both so she doesn't have to pay.  Best [phone]

## 2012-09-20 DIAGNOSIS — Z23 Encounter for immunization: Secondary | ICD-10-CM | POA: Diagnosis not present

## 2012-09-24 ENCOUNTER — Ambulatory Visit (INDEPENDENT_AMBULATORY_CARE_PROVIDER_SITE_OTHER): Payer: Medicare Other | Admitting: Gastroenterology

## 2012-09-24 ENCOUNTER — Other Ambulatory Visit: Payer: Self-pay | Admitting: Internal Medicine

## 2012-09-24 ENCOUNTER — Encounter: Payer: Self-pay | Admitting: Gastroenterology

## 2012-09-24 VITALS — BP 134/60 | HR 72 | Ht <= 58 in | Wt 116.2 lb

## 2012-09-24 DIAGNOSIS — K625 Hemorrhage of anus and rectum: Secondary | ICD-10-CM | POA: Diagnosis not present

## 2012-09-24 DIAGNOSIS — K2270 Barrett's esophagus without dysplasia: Secondary | ICD-10-CM

## 2012-09-24 MED ORDER — PROMETHAZINE HCL 25 MG PO TABS: 25.0000 mg | ORAL_TABLET | Freq: Four times a day (QID) | ORAL | Status: DC | PRN

## 2012-09-24 MED ORDER — PROCHLORPERAZINE MALEATE 10 MG PO TABS: 5.0000 mg | ORAL_TABLET | Freq: Four times a day (QID) | ORAL | Status: DC | PRN

## 2012-09-24 MED ORDER — POLYETHYLENE GLYCOL 3350 17 GM/SCOOP PO POWD: 17.0000 g | ORAL | Status: DC

## 2012-09-24 MED ORDER — OMEPRAZOLE 40 MG PO CPDR: 40.0000 mg | DELAYED_RELEASE_CAPSULE | Freq: Every day | ORAL | Status: DC

## 2012-09-24 NOTE — Progress Notes (Signed)
History of Present Illness:  [name] has returned for evaluation of constipation and rectal bleeding. She takes MiraLax intermittently. When she is unable to move her bowels she will manually remove stool. When that occurs she has seen some small amounts of blood on the toilet tissue.  Colonoscopy 2 years ago for limited rectal bleeding demonstrated stercoral ulcers. She has a history of Barrett's esophagus and was last endoscoped in 2011 demonstrated only inflammatory changes at the GE junction. She had mild abdominal pain several weeks ago which has entirely subsided. She has occasional nausea.    Review of Systems: Pertinent positive and negative review of systems were noted in the above HPI section. All other review of systems were otherwise negative.    Current Medications, Allergies, Past Medical History, Past Surgical History, Family History and Social History were reviewed in [employer] electronic medical record  Vital signs were reviewed in today's medical record. Physical Exam: General: Well developed , well nourished, no acute distress

## 2012-09-24 NOTE — Assessment & Plan Note (Signed)
Plan followup endoscopy 2014

## 2012-09-24 NOTE — Assessment & Plan Note (Signed)
Recommend taking MiraLax every other day. She can titrate the dose as needed.

## 2012-09-24 NOTE — Assessment & Plan Note (Addendum)
She has had very limited rectal bleeding after manual digitation of the rectum. She's had stercoral ulcers in the past. I suspect that her current bleeding is due to trauma. I strongly encouraged her to decease from this practice.

## 2012-09-24 NOTE — Patient Instructions (Addendum)
Please follow up in six to eight weeks with Dr. [name]  Please take Omeprazole one by mouth once daily   Please take Miralax every other day

## 2012-09-30 ENCOUNTER — Ambulatory Visit (INDEPENDENT_AMBULATORY_CARE_PROVIDER_SITE_OTHER): Payer: Medicare Other | Admitting: Family Medicine

## 2012-09-30 ENCOUNTER — Other Ambulatory Visit: Payer: Self-pay | Admitting: Family Medicine

## 2012-09-30 ENCOUNTER — Ambulatory Visit: Payer: Medicare Other

## 2012-09-30 VITALS — BP 160/84 | HR 90 | Temp 97.9°F | Resp 16 | Ht <= 58 in | Wt 114.0 lb

## 2012-09-30 DIAGNOSIS — M79603 Pain in arm, unspecified: Secondary | ICD-10-CM

## 2012-09-30 DIAGNOSIS — M79609 Pain in unspecified limb: Secondary | ICD-10-CM | POA: Diagnosis not present

## 2012-09-30 DIAGNOSIS — M25539 Pain in unspecified wrist: Secondary | ICD-10-CM

## 2012-09-30 DIAGNOSIS — M25519 Pain in unspecified shoulder: Secondary | ICD-10-CM

## 2012-09-30 DIAGNOSIS — T148XXA Other injury of unspecified body region, initial encounter: Secondary | ICD-10-CM

## 2012-09-30 NOTE — Progress Notes (Signed)
[facility]:  Office Visit  Chief Complaint:  Chief Complaint  Patient presents with  . Fall    fell about 2 weeks ago- R arm pain  . Hand Pain    L hand- possible trigger finger    HPI: [name] is a 78 y.o. female who complains of  Right sided pain, starting from shoulder down to arm and wrist and hand, achey s/p fall 2 weeks ago. She was out in her yard and slipped. Patient has dementia and is tangential so I am not sure what actually occurred. She tried rubbing BEngay on it but without improvement.   Past Medical History  Diagnosis Date  . Glaucoma(365)   . Dementia   . Spondylosis   . Foot deformity   . Migraine   . CAD (coronary artery disease)   . Elevated BP   . Diverticulosis   . H/O: GI bleed   . Acute renal insufficiency   . GERD (gastroesophageal reflux disease)   . Barrett's esophagus   . Hyperglycemia   . HSV-2 (herpes simplex virus 2) infection   . Abnormal finding on Pap smear, ASCUS   . Osteoporosis     worsening  . Amputation finger     index   Past Surgical History  Procedure Date  . Partial hysterectomy   . Amputation finger / thumb     secondary to osteomyelitis  . Foot surgery     right   History   Social History  . Marital Status: Single    Spouse Name: N/A    Number of Children: 1  . Years of Education: N/A   Occupational History  .     Social History Main Topics  . Smoking status: Never Smoker   . Smokeless tobacco: Never Used  . Alcohol Use: No  . Drug Use: No  . Sexually Active: None   Other Topics Concern  . None   Social History Narrative  . None   Family History  Problem Relation Age of Onset  . Breast cancer Mother   . Heart attack Mother   . Heart disease Father   . Alcohol abuse Son    No Known Allergies Prior to Admission medications   Medication Sig Start Date End Date Taking? Authorizing Provider  aspirin 81 MG tablet Take 81 mg by mouth daily.     Yes Historical Provider, MD    Aspirin-Acetaminophen-Caffeine (MIGRAINE FORMULA PO) Take by mouth.     Yes Historical Provider, MD  calcium carbonate (CALCIUM 500) 1250 MG tablet Take 1 tablet by mouth daily.     Yes Historical Provider, MD  Cholecalciferol (VITAMIN D-3 PO) Take 4,500 mg by mouth daily.     Yes Historical Provider, MD  conjugated estrogens (PREMARIN) vaginal cream Place vaginally 2 (two) times a week.     Yes Historical Provider, MD  desonide (DESOWEN) 0.05 % cream Apply topically 2 (two) times daily.     Yes Historical Provider, MD  ferrous sulfate 325 (65 FE) MG tablet Take 325 mg by mouth daily with breakfast.     Yes Historical Provider, MD  gabapentin (NEURONTIN) 800 MG tablet take 1 tablet by mouth at bedtime 09/24/12  Yes Ryan M Dunn, PA-C  hydrOXYzine (ATARAX/VISTARIL) 10 MG tablet Take 10-30 mg by mouth every 4 (four) hours as needed.     Yes Historical Provider, MD  metoprolol succinate (TOPROL-XL) 50 MG 24 hr tablet Take 1 tablet (50 mg total) by mouth  daily. 08/25/12  Yes Morrell Riddle, PA-C  Multiple Vitamins-Minerals (EQL CENTRAL-VITE SELECT PO) Take by mouth.     Yes Historical Provider, MD  Multiple Vitamins-Minerals (HAIR/SKIN/NAILS PO) Take by mouth daily.     Yes Historical Provider, MD  nystatin (MYCOSTATIN) cream Apply topically as needed.     Yes Historical Provider, MD  omeprazole (PRILOSEC) 40 MG capsule Take 1 capsule (40 mg total) by mouth daily. 09/24/12  Yes Louis Meckel, MD  polyethylene glycol powder [facility]) powder Take 17 g by mouth as directed. 09/24/12  Yes Louis Meckel, MD  prochlorperazine (COMPAZINE) 10 MG tablet Take 0.5 tablets (5 mg total) by mouth every 6 (six) hours as needed. 09/24/12  Yes Louis Meckel, MD  verapamil (CALAN-SR) 240 MG CR tablet Take 1 tablet (240 mg total) by mouth every morning. 08/27/12  Yes Collene Gobble, MD  prochlorperazine (COMPAZINE) 10 MG tablet Take 1 tablet (10 mg total) by mouth every 6 (six) hours as needed. 11/20/11 11/27/11  Louis Meckel, MD     ROS: The patient denies fevers, chills, night sweats, unintentional weight loss, chest pain, palpitations, wheezing, dyspnea on exertion, nausea, vomiting, abdominal pain, dysuria, hematuria, melena, numbness, weakness, or tingling.   All other systems have been reviewed and were otherwise negative with the exception of those mentioned in the HPI and as above.    PHYSICAL EXAM: Filed Vitals:   09/30/12 0804  BP: 160/84  Pulse: 90  Temp: 97.9 F (36.6 C)  Resp: 16   Filed Vitals:   09/30/12 0804  Height: 4\' 9"  (1.448 m)  Weight: 114 lb (51.71 kg)   Body mass index is 24.67 kg/(m^2).  General: Alert, no acute distress HEENT:  Normocephalic, atraumatic, oropharynx patent.  Cardiovascular:  Regular rate and rhythm, no rubs murmurs or gallops.  No Carotid bruits, radial pulse intact. No pedal edema.  Respiratory: Clear to auscultation bilaterally.  No wheezes, rales, or rhonchi.  No cyanosis, no use of accessory musculature GI: No organomegaly, abdomen is soft and non-tender, positive bowel sounds.  No masses. Skin: No rashes. Neurologic: Facial musculature symmetric. Psychiatric: Patient is appropriate throughout our interaction. Lymphatic: No cervical lymphadenopathy Musculoskeletal: Gait intact. ROM neck nl Right shoulder -pain with ER, IR and adduction; decrease ROM in flexion. 5/5 strength, sensation intact, 2/2 DTRs elbow Right elbow-nl Right wrist- nl Right hand-missing index finger   LABS: Results for orders placed in visit on 08/27/12  CBC WITH DIFFERENTIAL      Component Value Range   WBC 5.3  4.0 - 10.5 K/uL   RBC 4.07  3.87 - 5.11 MIL/uL   Hemoglobin 11.9 (*) 12.0 - 15.0 g/dL   HCT 13.0 (*) 86.5 - 78.4 %   MCV 86.7  78.0 - 100.0 fL   MCH 29.2  26.0 - 34.0 pg   MCHC 33.7  30.0 - 36.0 g/dL   RDW 69.6  29.5 - 28.4 %   Platelets 367  150 - 400 K/uL   Neutrophils Relative 68  43 - 77 %   Neutro Abs 3.6  1.7 - 7.7 K/uL   Lymphocytes Relative 20   12 - 46 %   Lymphs Abs 1.1  0.7 - 4.0 K/uL   Monocytes Relative 8  3 - 12 %   Monocytes Absolute 0.4  0.1 - 1.0 K/uL   Eosinophils Relative 3  0 - 5 %   Eosinophils Absolute 0.1  0.0 - 0.7 K/uL   Basophils Relative  1  0 - 1 %   Basophils Absolute 0.0  0.0 - 0.1 K/uL   Smear Review Criteria for review not met    COMPREHENSIVE METABOLIC PANEL      Component Value Range   Sodium 142  135 - 145 mEq/L   Potassium 4.1  3.5 - 5.3 mEq/L   Chloride 105  96 - 112 mEq/L   CO2 31  19 - 32 mEq/L   Glucose, Bld 75  70 - 99 mg/dL   BUN 10  6 - 23 mg/dL   Creat 1.61  0.96 - 0.45 mg/dL   Total Bilirubin 0.4  0.3 - 1.2 mg/dL   Alkaline Phosphatase 46  39 - 117 U/L   AST 15  0 - 37 U/L   ALT 9  0 - 35 U/L   Total Protein 6.3  6.0 - 8.3 g/dL   Albumin 3.8  3.5 - 5.2 g/dL   Calcium 9.4  8.4 - 40.9 mg/dL  RPR      Component Value Range   RPR NON REAC  NON REAC  HIV ANTIBODY (ROUTINE TESTING)      Component Value Range   HIV NON REACTIVE  NON REACTIVE  IFOBT (OCCULT BLOOD)      Component Value Range   IFOBT Positive    POCT URINALYSIS DIPSTICK      Component Value Range   Color, UA yellow     Clarity, UA slightly cloudy     Glucose, UA neg     Bilirubin, UA neg     Ketones, UA neg     Spec Grav, UA 1.015     Blood, UA neg     pH, UA 5.5     Protein, UA neg     Urobilinogen, UA 0.2     Nitrite, UA neg     Leukocytes, UA Trace    POCT UA - MICROSCOPIC ONLY      Component Value Range   WBC, Ur, HPF, POC 0-5     RBC, urine, microscopic 0-1     Bacteria, U Microscopic 1+     Mucus, UA trace     Epithelial cells, urine per micros 0-6     Crystals, Ur, HPF, POC neg     Casts, Ur, LPF, POC neg     Yeast, UA neg    GC/CHLAMYDIA PROBE AMP, URINE      Component Value Range   Chlamydia, Swab/Urine, PCR NEGATIVE  NEGATIVE   GC Probe Amp, Urine NEGATIVE  NEGATIVE     EKG/XRAY:   Primary read interpreted by Dr. Conley Rolls at [facility]. Missing right index finger No fx or  dislocation DJD   ASSESSMENT/PLAN: Encounter Diagnoses  Name Primary?  . Shoulder pain Yes  . Arm pain   . Wrist pain   . Sprain and strain    Right shoulder sprain/strain with DJD and possible rotator cuff tendinopathy No fractures on xray Advise to take tylenol and/or  ibuprofen as needed    LE, THAO PHUONG, DO 09/30/2012 9:19 AM

## 2012-10-02 ENCOUNTER — Telehealth: Payer: Self-pay | Admitting: Radiology

## 2012-10-02 DIAGNOSIS — M6530 Trigger finger, unspecified finger: Secondary | ICD-10-CM

## 2012-10-02 NOTE — Telephone Encounter (Signed)
Pt called and wanted to know results of x-rays, informed her of the radiologist readings. Pt understood.  DAUB: She states her finger is really hurting due to possible trigger finger, she was advised by Dr Conley Rolls to put alcohol and bengay on it, but is not helping. She would like to know your opinion as to what to do and also consideration to hand surgeon. Please advise.

## 2012-10-02 NOTE — Telephone Encounter (Signed)
Call patient and let her know she could get in to see her hand doctor if she continues to have prodlems

## 2012-10-03 NOTE — Telephone Encounter (Signed)
Referral to hand surgeon has seen Dr Amanda Pea.

## 2012-10-08 ENCOUNTER — Encounter: Payer: Self-pay | Admitting: Family Medicine

## 2012-10-08 DIAGNOSIS — M2010 Hallux valgus (acquired), unspecified foot: Secondary | ICD-10-CM | POA: Diagnosis not present

## 2012-10-08 DIAGNOSIS — M722 Plantar fascial fibromatosis: Secondary | ICD-10-CM | POA: Diagnosis not present

## 2012-10-08 DIAGNOSIS — M205X9 Other deformities of toe(s) (acquired), unspecified foot: Secondary | ICD-10-CM | POA: Diagnosis not present

## 2012-10-09 ENCOUNTER — Other Ambulatory Visit: Payer: Self-pay | Admitting: Gastroenterology

## 2012-10-21 DIAGNOSIS — M6530 Trigger finger, unspecified finger: Secondary | ICD-10-CM | POA: Diagnosis not present

## 2012-11-15 ENCOUNTER — Encounter: Payer: Self-pay | Admitting: Gastroenterology

## 2012-11-15 ENCOUNTER — Ambulatory Visit (INDEPENDENT_AMBULATORY_CARE_PROVIDER_SITE_OTHER): Payer: Medicare Other | Admitting: Gastroenterology

## 2012-11-15 VITALS — BP 140/80 | HR 93 | Ht <= 58 in | Wt 117.0 lb

## 2012-11-15 DIAGNOSIS — K5900 Constipation, unspecified: Secondary | ICD-10-CM

## 2012-11-15 DIAGNOSIS — K2270 Barrett's esophagus without dysplasia: Secondary | ICD-10-CM | POA: Diagnosis not present

## 2012-11-15 DIAGNOSIS — K625 Hemorrhage of anus and rectum: Secondary | ICD-10-CM

## 2012-11-15 NOTE — Assessment & Plan Note (Signed)
She's had an excellent response to MiraLax every other day

## 2012-11-15 NOTE — Progress Notes (Signed)
History of Present Illness:  Mrs. Renato Gails has returned for followup of constipation and rectal bleeding. On a regimen of MiraLax every other day she is moving her bowels regularly. She is without pain or bleeding.    Review of Systems: Pertinent positive and negative review of systems were noted in the above HPI section. All other review of systems were otherwise negative.    Current Medications, Allergies, Past Medical History, Past Surgical History, Family History and Social History were reviewed in [employer] electronic medical record  Vital signs were reviewed in today's medical record. Physical Exam: General: Well developed , well nourished, no acute distress

## 2012-11-15 NOTE — Patient Instructions (Addendum)
Follow up Endoscopy in 6 months

## 2012-11-15 NOTE — Assessment & Plan Note (Signed)
Plan followup endoscopy in May, 2014

## 2012-11-15 NOTE — Assessment & Plan Note (Signed)
This is likely related to hemorrhoids and has subsided

## 2012-11-18 ENCOUNTER — Other Ambulatory Visit: Payer: Self-pay | Admitting: Physician Assistant

## 2013-01-13 ENCOUNTER — Other Ambulatory Visit: Payer: Self-pay | Admitting: Physician Assistant

## 2013-01-14 DIAGNOSIS — H4011X0 Primary open-angle glaucoma, stage unspecified: Secondary | ICD-10-CM | POA: Diagnosis not present

## 2013-01-14 DIAGNOSIS — H2510 Age-related nuclear cataract, unspecified eye: Secondary | ICD-10-CM | POA: Diagnosis not present

## 2013-01-14 DIAGNOSIS — H409 Unspecified glaucoma: Secondary | ICD-10-CM | POA: Diagnosis not present

## 2013-01-30 DIAGNOSIS — Z124 Encounter for screening for malignant neoplasm of cervix: Secondary | ICD-10-CM | POA: Diagnosis not present

## 2013-01-30 DIAGNOSIS — R300 Dysuria: Secondary | ICD-10-CM | POA: Diagnosis not present

## 2013-01-30 DIAGNOSIS — L293 Anogenital pruritus, unspecified: Secondary | ICD-10-CM | POA: Diagnosis not present

## 2013-01-30 DIAGNOSIS — Z1211 Encounter for screening for malignant neoplasm of colon: Secondary | ICD-10-CM | POA: Diagnosis not present

## 2013-02-13 ENCOUNTER — Other Ambulatory Visit: Payer: Self-pay | Admitting: Physician Assistant

## 2013-02-17 ENCOUNTER — Telehealth: Payer: Self-pay | Admitting: Radiology

## 2013-02-17 NOTE — Telephone Encounter (Signed)
Spoke to patient, she had called and her message was put in the wrong chart. She is advised when she had her Tdap and her Pneumonia vaccines. Copy mailed to patient.

## 2013-02-18 ENCOUNTER — Other Ambulatory Visit: Payer: Self-pay | Admitting: Internal Medicine

## 2013-02-25 ENCOUNTER — Ambulatory Visit: Payer: Medicare Other | Admitting: Emergency Medicine

## 2013-03-10 ENCOUNTER — Encounter: Payer: Self-pay | Admitting: Emergency Medicine

## 2013-03-10 ENCOUNTER — Other Ambulatory Visit: Payer: Self-pay | Admitting: Physician Assistant

## 2013-03-11 ENCOUNTER — Ambulatory Visit: Payer: Medicare Other | Admitting: Emergency Medicine

## 2013-03-25 ENCOUNTER — Encounter: Payer: Self-pay | Admitting: Emergency Medicine

## 2013-03-25 ENCOUNTER — Ambulatory Visit (INDEPENDENT_AMBULATORY_CARE_PROVIDER_SITE_OTHER): Payer: Medicare Other | Admitting: Emergency Medicine

## 2013-03-25 VITALS — BP 154/74 | HR 78 | Temp 98.1°F | Resp 16 | Ht <= 58 in | Wt 113.6 lb

## 2013-03-25 DIAGNOSIS — M549 Dorsalgia, unspecified: Secondary | ICD-10-CM

## 2013-03-25 DIAGNOSIS — I10 Essential (primary) hypertension: Secondary | ICD-10-CM

## 2013-03-25 DIAGNOSIS — L299 Pruritus, unspecified: Secondary | ICD-10-CM

## 2013-03-25 DIAGNOSIS — R634 Abnormal weight loss: Secondary | ICD-10-CM

## 2013-03-25 DIAGNOSIS — R319 Hematuria, unspecified: Secondary | ICD-10-CM

## 2013-03-25 DIAGNOSIS — A539 Syphilis, unspecified: Secondary | ICD-10-CM | POA: Diagnosis not present

## 2013-03-25 DIAGNOSIS — D649 Anemia, unspecified: Secondary | ICD-10-CM

## 2013-03-25 LAB — COMPREHENSIVE METABOLIC PANEL
ALT: 8 U/L (ref 0–35)
AST: 12 U/L (ref 0–37)
Albumin: 3.3 g/dL — ABNORMAL LOW (ref 3.5–5.2)
Alkaline Phosphatase: 45 U/L (ref 39–117)
Calcium: 9 mg/dL (ref 8.4–10.5)
Chloride: 104 mEq/L (ref 96–112)
Potassium: 4.1 mEq/L (ref 3.5–5.3)
Sodium: 141 mEq/L (ref 135–145)

## 2013-03-25 LAB — POCT URINALYSIS DIPSTICK
Bilirubin, UA: NEGATIVE
Glucose, UA: NEGATIVE
Ketones, UA: NEGATIVE
Leukocytes, UA: NEGATIVE
Nitrite, UA: POSITIVE
pH, UA: 6.5

## 2013-03-25 LAB — CBC WITH DIFFERENTIAL/PLATELET
Basophils Absolute: 0.1 10*3/uL (ref 0.0–0.1)
Basophils Relative: 1 % (ref 0–1)
HCT: 35.2 % — ABNORMAL LOW (ref 36.0–46.0)
Lymphocytes Relative: 19 % (ref 12–46)
MCHC: 33.5 g/dL (ref 30.0–36.0)
Neutro Abs: 3.5 10*3/uL (ref 1.7–7.7)
Neutrophils Relative %: 71 % (ref 43–77)
Platelets: 382 10*3/uL (ref 150–400)
RDW: 15 % (ref 11.5–15.5)
WBC: 4.8 10*3/uL (ref 4.0–10.5)

## 2013-03-25 MED ORDER — METOPROLOL SUCCINATE ER 50 MG PO TB24: 50.0000 mg | ORAL_TABLET | Freq: Every day | ORAL | Status: DC

## 2013-03-25 MED ORDER — VERAPAMIL HCL ER 240 MG PO TBCR: 240.0000 mg | EXTENDED_RELEASE_TABLET | Freq: Every morning | ORAL | Status: DC

## 2013-03-25 NOTE — Progress Notes (Signed)
  Subjective:    Patient ID: [name], female    DOB: [birth year], 79 y.o.   MRN: [id number]  HPI patient for followup. She overall has been doing well. She said all Dr. [name] recently related to her GI problems. She has been to see Dr. Mitzi Hansen her GYN specialist. She still has to deal with her so I has an alcohol problem. She denies been sexual active since last March when she developed syphilis from a sexual encounter.   Review of Systems     Objective:   Physical Exam weight is down 4 pounds since November. Neck is supple chest is clear heart regular rate no murmurs abdomen is soft without tenderness.        Assessment & Plan:  I've encouraged her to eat better and not skip meals. We'll try some protein supplementation either boost or Ensure. Routine labs were done today. She has lost 4 pounds since November we'll need to keep close watch on this I will make an appointment in 3 months to recheck her weight. I did include a thyroid test and a sedimentation rate on her blood work.

## 2013-03-26 ENCOUNTER — Telehealth: Payer: Self-pay | Admitting: Emergency Medicine

## 2013-03-26 NOTE — Telephone Encounter (Signed)
Called patient to advise  °

## 2013-03-26 NOTE — Telephone Encounter (Signed)
labs look good. She should take a multivitamin with iron one a day.

## 2013-03-29 LAB — URINE CULTURE: Colony Count: >=100000

## 2013-03-31 ENCOUNTER — Other Ambulatory Visit: Payer: Self-pay | Admitting: Radiology

## 2013-03-31 MED ORDER — CEPHALEXIN 500 MG PO TABS: 500.0000 mg | ORAL_TABLET | Freq: Two times a day (BID) | ORAL | Status: DC

## 2013-04-01 ENCOUNTER — Telehealth: Payer: Self-pay | Admitting: Radiology

## 2013-04-01 ENCOUNTER — Telehealth: Payer: Self-pay | Admitting: Gastroenterology

## 2013-04-01 NOTE — Telephone Encounter (Signed)
Patient called about her ESI, she is to have one by Dr Ethelene Hal. She has to stop her aspirin and multivitamin., all her other meds are okay. [name]

## 2013-04-02 NOTE — Telephone Encounter (Signed)
Spoke with pt and let her know these drugs do not contain ASA.

## 2013-04-04 DIAGNOSIS — M47817 Spondylosis without myelopathy or radiculopathy, lumbosacral region: Secondary | ICD-10-CM | POA: Diagnosis not present

## 2013-04-04 DIAGNOSIS — M5137 Other intervertebral disc degeneration, lumbosacral region: Secondary | ICD-10-CM | POA: Diagnosis not present

## 2013-04-15 ENCOUNTER — Telehealth: Payer: Self-pay

## 2013-04-15 NOTE — Telephone Encounter (Signed)
It would be fine for her  To get a back brace at either [facility] or [facility].. She could wear this when she is out working in the yard. I would not like her to wear this all the time the

## 2013-04-15 NOTE — Telephone Encounter (Signed)
Pt would like to talk with someone about where to get a back brace

## 2013-04-15 NOTE — Telephone Encounter (Signed)
Do you want her to have a back brace?

## 2013-04-16 NOTE — Telephone Encounter (Signed)
Thanks, I advised her to use it for yard work only because it could weaken her abdominal muscles

## 2013-04-24 ENCOUNTER — Encounter: Payer: Self-pay | Admitting: Gastroenterology

## 2013-04-24 DIAGNOSIS — M545 Low back pain: Secondary | ICD-10-CM | POA: Diagnosis not present

## 2013-04-30 ENCOUNTER — Encounter: Payer: Self-pay | Admitting: Gastroenterology

## 2013-05-01 ENCOUNTER — Telehealth: Payer: Self-pay

## 2013-05-01 NOTE — Telephone Encounter (Signed)
Call Doristine Counter and thank her for letting me know

## 2013-05-01 NOTE — Telephone Encounter (Signed)
Pt called to report to Dr Cleta Alberts that she is having her annual checkup w/dermatologist on 05/27/13 and goes in for an endoscopy on 06/17/13. She wants Dr Cleta Alberts to know beforehand what she is getting ready to have done medically.

## 2013-05-02 NOTE — Telephone Encounter (Signed)
Thanks, I called her to advise.  

## 2013-05-10 ENCOUNTER — Other Ambulatory Visit: Payer: Self-pay | Admitting: Physician Assistant

## 2013-05-22 ENCOUNTER — Ambulatory Visit (INDEPENDENT_AMBULATORY_CARE_PROVIDER_SITE_OTHER): Payer: Medicare Other | Admitting: Emergency Medicine

## 2013-05-22 VITALS — BP 152/70 | HR 76 | Temp 97.3°F | Resp 16 | Ht <= 58 in | Wt 118.0 lb

## 2013-05-22 DIAGNOSIS — R350 Frequency of micturition: Secondary | ICD-10-CM | POA: Diagnosis not present

## 2013-05-22 DIAGNOSIS — R21 Rash and other nonspecific skin eruption: Secondary | ICD-10-CM | POA: Diagnosis not present

## 2013-05-22 DIAGNOSIS — R609 Edema, unspecified: Secondary | ICD-10-CM

## 2013-05-22 LAB — POCT UA - MICROSCOPIC ONLY
Casts, Ur, LPF, POC: NEGATIVE
Mucus, UA: POSITIVE
Yeast, UA: NEGATIVE

## 2013-05-22 LAB — POCT URINALYSIS DIPSTICK
Bilirubin, UA: NEGATIVE
Blood, UA: NEGATIVE
Glucose, UA: NEGATIVE
Nitrite, UA: POSITIVE
Urobilinogen, UA: 0.2

## 2013-05-22 MED ORDER — HYDROCHLOROTHIAZIDE 12.5 MG PO CAPS: ORAL_CAPSULE | ORAL | Status: DC

## 2013-05-22 MED ORDER — BETAMETHASONE DIPROPIONATE 0.05 % EX CREA: TOPICAL_CREAM | Freq: Two times a day (BID) | CUTANEOUS | Status: DC

## 2013-05-22 NOTE — Progress Notes (Signed)
  Subjective:    Patient ID: [name], female    DOB: 1933/03/04, 79 y.o.   MRN: [id number]  HPI problem #1 is itching in the vaginal area. Problem #2 is urinary frequency. Problem #3 is a rash on her left forearm and both lower legs. Problem #4 is swelling of her lower extremities which seems to come and go. Patient has been working out in the yard a lot recently. She has developed a rash on both forearms and both lower legs    Review of Systems     Objective:   Physical Exam patient is alert  and cooperative. She is not in distress. There is a maculopapular rash involving the left forearm and both lower legs. Chest is clear heart regular rate no murmur there is no vaginal discharge. She has changes of vitiligo involving both labia appeared. Results for orders placed in visit on 05/22/13  POCT UA - MICROSCOPIC ONLY      Result Value Range   WBC, Ur, HPF, POC 2-4     RBC, urine, microscopic 0-2     Bacteria, U Microscopic 2 plus     Mucus, UA positive     Epithelial cells, urine per micros 2-6     Crystals, Ur, HPF, POC neg     Casts, Ur, LPF, POC neg     Yeast, UA neg    POCT URINALYSIS DIPSTICK      Result Value Range   Color, UA yellow     Clarity, UA cloudy     Glucose, UA neg     Bilirubin, UA neg     Ketones, UA neg\     Spec Grav, UA 1.015     Blood, UA neg     pH, UA 5.5     Protein, UA neg     Urobilinogen, UA 0.2     Nitrite, UA positive     Leukocytes, UA small (1+)         Assessment & Plan:  Urine culture was done. The rash on her arms and legs she was given hydrocortisone cream. She is to use HCTZ 12.5 2 times a week.

## 2013-05-24 ENCOUNTER — Telehealth: Payer: Self-pay

## 2013-05-24 LAB — URINE CULTURE: Colony Count: >=100000

## 2013-05-24 NOTE — Telephone Encounter (Signed)
Pt states that the rx for her vaginal itching is not at the pharmacy, [facility] [address] Best# [phone]

## 2013-05-24 NOTE — Telephone Encounter (Signed)
Pt states her rx's are not at the pharmacy.  Please call pt  bf

## 2013-05-26 MED ORDER — SULFAMETHOXAZOLE-TRIMETHOPRIM 800-160 MG PO TABS: 1.0000 | ORAL_TABLET | Freq: Two times a day (BID) | ORAL | Status: DC

## 2013-05-26 NOTE — Telephone Encounter (Signed)
Urine culture shows sensitivity to bactrim, have sent this to the pharmacy.  Dr. Cleta Alberts, to you FYI

## 2013-05-26 NOTE — Telephone Encounter (Signed)
No answer no vm

## 2013-05-27 DIAGNOSIS — L219 Seborrheic dermatitis, unspecified: Secondary | ICD-10-CM | POA: Diagnosis not present

## 2013-05-27 DIAGNOSIS — L821 Other seborrheic keratosis: Secondary | ICD-10-CM | POA: Diagnosis not present

## 2013-05-27 DIAGNOSIS — I8310 Varicose veins of unspecified lower extremity with inflammation: Secondary | ICD-10-CM | POA: Diagnosis not present

## 2013-05-28 ENCOUNTER — Encounter: Payer: Self-pay | Admitting: Cardiology

## 2013-05-29 NOTE — Telephone Encounter (Signed)
Unable to reach patient  I did call the pharmacy to see if patient picked up the Rx and she did pick up the Bactrim

## 2013-06-09 ENCOUNTER — Ambulatory Visit (AMBULATORY_SURGERY_CENTER): Payer: Medicare Other | Admitting: *Deleted

## 2013-06-09 VITALS — Ht 59.0 in | Wt 120.8 lb

## 2013-06-09 DIAGNOSIS — K2270 Barrett's esophagus without dysplasia: Secondary | ICD-10-CM

## 2013-06-09 NOTE — Progress Notes (Signed)
No egg or soy allergy. ewm Pt has last endoscopy 05-12-2010 with [facility]. ewm

## 2013-06-17 ENCOUNTER — Encounter: Payer: Self-pay | Admitting: Gastroenterology

## 2013-06-17 ENCOUNTER — Ambulatory Visit (AMBULATORY_SURGERY_CENTER): Payer: Medicare Other | Admitting: Gastroenterology

## 2013-06-17 VITALS — BP 142/74 | HR 67 | Temp 97.1°F | Resp 68 | Ht 59.0 in | Wt 120.0 lb

## 2013-06-17 DIAGNOSIS — D131 Benign neoplasm of stomach: Secondary | ICD-10-CM | POA: Diagnosis not present

## 2013-06-17 DIAGNOSIS — F0390 Unspecified dementia without behavioral disturbance: Secondary | ICD-10-CM | POA: Diagnosis not present

## 2013-06-17 DIAGNOSIS — K2270 Barrett's esophagus without dysplasia: Secondary | ICD-10-CM

## 2013-06-17 DIAGNOSIS — N289 Disorder of kidney and ureter, unspecified: Secondary | ICD-10-CM | POA: Diagnosis not present

## 2013-06-17 DIAGNOSIS — K259 Gastric ulcer, unspecified as acute or chronic, without hemorrhage or perforation: Secondary | ICD-10-CM | POA: Diagnosis not present

## 2013-06-17 DIAGNOSIS — I10 Essential (primary) hypertension: Secondary | ICD-10-CM | POA: Diagnosis not present

## 2013-06-17 DIAGNOSIS — I2510 Atherosclerotic heart disease of native coronary artery without angina pectoris: Secondary | ICD-10-CM | POA: Diagnosis not present

## 2013-06-17 DIAGNOSIS — H409 Unspecified glaucoma: Secondary | ICD-10-CM | POA: Diagnosis not present

## 2013-06-17 MED ORDER — SODIUM CHLORIDE 0.9 % IV SOLN: 500.0000 mL | INTRAVENOUS | Status: DC

## 2013-06-17 NOTE — Patient Instructions (Addendum)

## 2013-06-17 NOTE — Progress Notes (Signed)
Called to room to assist during endoscopic procedure.  Patient ID and intended procedure confirmed with present staff. Received instructions for my participation in the procedure from the performing physician. ewm 

## 2013-06-17 NOTE — Progress Notes (Signed)
Patient did not have preoperative order for IV antibiotic SSI prophylaxis. (G8918)  Patient did not experience any of the following events: a burn prior to discharge; a fall within the facility; wrong site/side/patient/procedure/implant event; or a hospital transfer or hospital admission upon discharge from the facility. (G8907)  

## 2013-06-17 NOTE — Op Note (Addendum)
[facility] [address] [place], [address]   ENDOSCOPY PROCEDURE REPORT  PATIENT: [name], [name]  MR#: [id number] BIRTHDATE: 1933-09-04 , 79  yrs. old GENDER: Female ENDOSCOPIST: Louis Meckel, MD REFERRED BY:  Lesle Chris, M.D. PROCEDURE DATE:  06/17/2013 PROCEDURE:  EGD w/ biopsy ASA CLASS:     Class II INDICATIONS:  Surveillance, history of Barrett's esophagus MEDICATIONS: MAC sedation, administered by CRNA, propofol (Diprivan) 50mg  IV, and Simethicone 0.6cc PO TOPICAL ANESTHETIC:  DESCRIPTION OF PROCEDURE: After the risks benefits and alternatives of the procedure were thoroughly explained, informed consent was obtained.  The LB [id number] endoscope was introduced through the mouth and advanced to the third portion of the duodenum. Without limitations.  The instrument was slowly withdrawn as the mucosa was fully examined.      There several superficial ulcers in the gastric fundus ranging from 3-78mm.  Surrounding mucosa was normal.  Biopsies were taken. There was a 5 cm sliding hiatal hernia. Z line is at 30 cm.  Established Barrett's in the distal esophagus was biopsied in all 4 quadrants.   The remainder of the upper endoscopy exam was otherwise normal.  Retroflexed views revealed no abnormalities.     The scope was then withdrawn from the patient and the procedure completed.  COMPLICATIONS: There were no complications. ENDOSCOPIC IMPRESSION: 1.   gastric ulcers 2.  Barrett's esophagus 3.  hiatal hernia  RECOMMENDATIONS: 1.  Await biopsy results 2.  Avoid NSAIDS for two weeks 3.  Continue PPI 4.  Call office next 2-3 days to schedule an office appointment for 4 weeks  REPEAT EXAM:  eSigned:  Louis Meckel, MD 06/17/2013 11:56 AM Revised: 06/17/2013 11:56 AM  CC:  PATIENT NAME:  [name], [name] MR#: [id number]

## 2013-06-17 NOTE — Progress Notes (Signed)
Propofol given over incremental dosages 

## 2013-06-18 ENCOUNTER — Telehealth: Payer: Self-pay | Admitting: Gastroenterology

## 2013-06-18 ENCOUNTER — Telehealth: Payer: Self-pay | Admitting: *Deleted

## 2013-06-18 NOTE — Telephone Encounter (Signed)
Pt is asking to speak directly to nurse also about her procedure

## 2013-06-18 NOTE — Telephone Encounter (Signed)
No answer. No machine. No message left.

## 2013-06-19 MED ORDER — PROMETHAZINE HCL 25 MG PO TABS: 25.0000 mg | ORAL_TABLET | Freq: Four times a day (QID) | ORAL | Status: DC | PRN

## 2013-06-19 NOTE — Telephone Encounter (Signed)
LOOKED UP SOME INFORMATION ONLINE AND MAILED TO PATIENT

## 2013-06-19 NOTE — Telephone Encounter (Signed)
Patient wanted to make sure I sent in her med to her pharmacy   Wants a diet for ulcers     Dr [name], What diet do you recommend for this pt

## 2013-06-19 NOTE — Telephone Encounter (Signed)
Medication sent to pharmacy  

## 2013-06-19 NOTE — Telephone Encounter (Signed)
There is no special diet that she has to adhere to. She should avoid foods that bother her stomach

## 2013-06-19 NOTE — Addendum Note (Signed)
Addended by: Marlowe Kays on: 06/19/2013 09:50 AM   Modules accepted: Orders

## 2013-06-24 ENCOUNTER — Other Ambulatory Visit: Payer: Self-pay | Admitting: Emergency Medicine

## 2013-06-24 ENCOUNTER — Encounter: Payer: Self-pay | Admitting: Emergency Medicine

## 2013-06-24 ENCOUNTER — Ambulatory Visit: Payer: Medicare Other

## 2013-06-24 ENCOUNTER — Ambulatory Visit (INDEPENDENT_AMBULATORY_CARE_PROVIDER_SITE_OTHER): Payer: Medicare Other | Admitting: Emergency Medicine

## 2013-06-24 VITALS — BP 144/66 | HR 82 | Temp 97.9°F | Resp 16 | Ht <= 58 in | Wt 114.4 lb

## 2013-06-24 DIAGNOSIS — M6530 Trigger finger, unspecified finger: Secondary | ICD-10-CM

## 2013-06-24 DIAGNOSIS — M25512 Pain in left shoulder: Secondary | ICD-10-CM

## 2013-06-24 DIAGNOSIS — M25519 Pain in unspecified shoulder: Secondary | ICD-10-CM

## 2013-06-24 NOTE — Progress Notes (Signed)
  Subjective:    Patient ID: [name], female    DOB: [birth year], 79 y.o.   MRN: [id number]  HPI patient enters complaining of pain in her left arm and shoulder. She has severe discomfort when she raises her left arm. She has no numbness or. She denies injury. She does have a trigger finger on that hand which is followed by the hand surgeon   Review of Systems     Objective:   Physical Exam there is crepitation noted over the subdeltoid area. She has limited abduction of the left arm. Neurovascular is intact. She does have a trigger finger on the middle finger of her left hand.Marland Kitchen  [facility] reading (PRIMARY) by  Dr. Cleta Alberts there degenerative changes around the humeral head with calcification inferiorly as well as chondrocalcinosis         Assessment & Plan:  The patient has arthritis with a subdeltoid bursitis. The shoulder was prepped with Betadine cleaned with alcohol swab it was injected with 40 Depo-Medrol and 2 cc of 2% plain patient tolerated well.

## 2013-06-25 ENCOUNTER — Telehealth: Payer: Self-pay | Admitting: Gastroenterology

## 2013-06-25 ENCOUNTER — Other Ambulatory Visit (INDEPENDENT_AMBULATORY_CARE_PROVIDER_SITE_OTHER): Payer: Medicare Other

## 2013-06-25 DIAGNOSIS — K625 Hemorrhage of anus and rectum: Secondary | ICD-10-CM

## 2013-06-25 MED ORDER — ONDANSETRON HCL 4 MG PO TABS: 4.0000 mg | ORAL_TABLET | Freq: Every day | ORAL | Status: DC | PRN

## 2013-06-25 NOTE — Telephone Encounter (Signed)
Called  Patient to inform that Zofran was sent to her pharmacy  No Answer

## 2013-06-26 ENCOUNTER — Telehealth: Payer: Self-pay

## 2013-06-26 ENCOUNTER — Telehealth: Payer: Self-pay | Admitting: Gastroenterology

## 2013-06-26 ENCOUNTER — Other Ambulatory Visit: Payer: Self-pay | Admitting: *Deleted

## 2013-06-26 DIAGNOSIS — K625 Hemorrhage of anus and rectum: Secondary | ICD-10-CM

## 2013-06-26 LAB — HEMOCCULT SLIDES (X 3 CARDS)
Fecal Occult Blood: NEGATIVE
OCCULT 1: POSITIVE
OCCULT 4: NEGATIVE
OCCULT 5: NEGATIVE

## 2013-06-26 NOTE — Telephone Encounter (Signed)
Communicated to patient that Zella Ball had sent an rx for Phenergan to her pharmacy yesterday.  She acknowledged and understood

## 2013-06-26 NOTE — Telephone Encounter (Signed)
Patient called and let me know that the new Zofran rx Robin sent over is also not covered by her insurance.  I told her I would initiate a prior authorization for it, but if her nausea was too bad, the Phenergan that was originally sent was only $9.99 without insurance.  Patient agreed

## 2013-06-30 ENCOUNTER — Encounter: Payer: Self-pay | Admitting: Gastroenterology

## 2013-07-02 ENCOUNTER — Telehealth: Payer: Self-pay | Admitting: Gastroenterology

## 2013-07-03 NOTE — Telephone Encounter (Signed)
Patient is following her

## 2013-07-03 NOTE — Telephone Encounter (Signed)
Patient is follow ing a diet of no spicy foods. She has felt better since following a diet...Marland KitchenMarland KitchenMarland Kitchen    She will go get her phenergan pills from the drug store

## 2013-07-15 DIAGNOSIS — H04129 Dry eye syndrome of unspecified lacrimal gland: Secondary | ICD-10-CM | POA: Diagnosis not present

## 2013-07-15 DIAGNOSIS — H2510 Age-related nuclear cataract, unspecified eye: Secondary | ICD-10-CM | POA: Diagnosis not present

## 2013-07-15 DIAGNOSIS — H4011X0 Primary open-angle glaucoma, stage unspecified: Secondary | ICD-10-CM | POA: Diagnosis not present

## 2013-07-22 ENCOUNTER — Encounter: Payer: Self-pay | Admitting: Gastroenterology

## 2013-07-22 ENCOUNTER — Ambulatory Visit (INDEPENDENT_AMBULATORY_CARE_PROVIDER_SITE_OTHER): Payer: Medicare Other | Admitting: Gastroenterology

## 2013-07-22 VITALS — BP 100/62 | HR 72 | Ht 59.0 in | Wt 118.0 lb

## 2013-07-22 DIAGNOSIS — K257 Chronic gastric ulcer without hemorrhage or perforation: Secondary | ICD-10-CM | POA: Diagnosis not present

## 2013-07-22 DIAGNOSIS — K2270 Barrett's esophagus without dysplasia: Secondary | ICD-10-CM | POA: Diagnosis not present

## 2013-07-22 DIAGNOSIS — K259 Gastric ulcer, unspecified as acute or chronic, without hemorrhage or perforation: Secondary | ICD-10-CM | POA: Insufficient documentation

## 2013-07-22 NOTE — Assessment & Plan Note (Signed)
Plan to continue PPI therapy. Repeat endoscopy in 3 years

## 2013-07-22 NOTE — Patient Instructions (Addendum)
Follow up as needed

## 2013-07-22 NOTE — Progress Notes (Signed)
History of Present Illness: the patient has returned following upper endoscopy for routine surveillance of Barrett's. No dysplasia was seen. She has several superficial gastric ulcers. She had been taking Excedrin. This was discontinued. She has no GI complaints.    Past Medical History  Diagnosis Date  . Glaucoma   . Dementia   . Spondylosis   . Foot deformity   . Migraine   . CAD (coronary artery disease)   . Elevated BP   . Diverticulosis   . H/O: GI bleed   . Acute renal insufficiency   . GERD (gastroesophageal reflux disease)   . Barrett's esophagus   . Hyperglycemia   . HSV-2 (herpes simplex virus 2) infection   . Abnormal finding on Pap smear, ASCUS   . Osteoporosis     worsening  . Amputation finger     index  . Hypertension    Past Surgical History  Procedure Laterality Date  . Partial hysterectomy    . Amputation finger / thumb      secondary to osteomyelitis  . Foot surgery      right  . Upper gastrointestinal endoscopy    . Colonoscopy     family history includes Alcohol abuse in her son; Breast cancer in her mother; Heart attack in her mother; and Heart disease in her father.  There is no history of Colon cancer, and Stomach cancer, and Rectal cancer, . Current Outpatient Prescriptions  Medication Sig Dispense Refill  . aspirin 81 MG tablet Take 81 mg by mouth daily.        . Aspirin-Acetaminophen-Caffeine (MIGRAINE FORMULA PO) Take by mouth.        . betamethasone dipropionate (DIPROLENE) 0.05 % cream Apply topically 2 (two) times daily.  45 g  1  . calcium carbonate (CALCIUM 500) 1250 MG tablet Take 1 tablet by mouth daily.        . Cholecalciferol (VITAMIN D-3 PO) Take 4,500 mg by mouth daily.        Marland Kitchen conjugated estrogens (PREMARIN) vaginal cream Place vaginally 2 (two) times a week.        . desonide (DESOWEN) 0.05 % cream Apply topically 2 (two) times daily.        . ferrous sulfate 325 (65 FE) MG tablet Take 325 mg by mouth daily with breakfast.         . gabapentin (NEURONTIN) 800 MG tablet take 1 tablet by mouth at bedtime  90 tablet  1  . hydrochlorothiazide (MICROZIDE) 12.5 MG capsule take 1 tablet by mouth ON TUESDAY AND SATURDAY EACH WEEK  20 capsule  0  . hydrOXYzine (ATARAX/VISTARIL) 10 MG tablet Take 10-30 mg by mouth every 4 (four) hours as needed.       . metoprolol succinate (TOPROL-XL) 50 MG 24 hr tablet Take 1 tablet (50 mg total) by mouth daily. Needs office visit (second notice)  30 tablet  11  . Multiple Vitamins-Minerals (EQL CENTRAL-VITE SELECT PO) Take by mouth.        . Multiple Vitamins-Minerals (HAIR/SKIN/NAILS PO) Take by mouth daily.        Marland Kitchen nystatin (MYCOSTATIN) cream Apply topically as needed.        Marland Kitchen omeprazole (PRILOSEC) 40 MG capsule Take 1 capsule (40 mg total) by mouth daily.  60 capsule  11  . ondansetron (ZOFRAN) 4 MG tablet Take 1 tablet (4 mg total) by mouth daily as needed for nausea.  30 tablet  1  . polyethylene glycol powder (MIRALAX)  powder Take 17 g by mouth as directed.  255 g  0  . prochlorperazine (COMPAZINE) 10 MG tablet Take 0.5 tablets (5 mg total) by mouth every 6 (six) hours as needed.  30 tablet  1  . promethazine (PHENERGAN) 25 MG tablet Take 1 tablet (25 mg total) by mouth every 6 (six) hours as needed for nausea.  30 tablet  3  . traMADol-acetaminophen (ULTRACET) 37.5-325 MG per tablet       . verapamil (CALAN-SR) 240 MG CR tablet Take 1 tablet (240 mg total) by mouth every morning.  30 tablet  11   No current facility-administered medications for this visit.   Allergies as of 07/22/2013  . (No Known Allergies)    reports that she has never smoked. She has never used smokeless tobacco. She reports that she does not drink alcohol or use illicit drugs.     Review of Systems: Pertinent positive and negative review of systems were noted in the above HPI section. All other review of systems were otherwise negative.  Vital signs were reviewed in today's medical record Physical  Exam: General: Well developed , well nourished, no acute distress

## 2013-07-22 NOTE — Assessment & Plan Note (Addendum)
Superficial gastric ulcers are probably NSAID related. Patient was carefully instructed to avoid NSAIDs and other aspirin products.  She will continue omeprazole.

## 2013-07-29 ENCOUNTER — Telehealth: Payer: Self-pay

## 2013-07-29 NOTE — Telephone Encounter (Signed)
PT STATES SHE HAD SOME XRAYS TAKEN AND HAVEN'T HEARD RESULTS. PLEASE CALL [phone]

## 2013-07-29 NOTE — Telephone Encounter (Signed)
Shoulder xrays show severe arthritis called her to advise she states it is improving have advised her to continue to move her shoulder, she states she is still using her arm and doing yard work. She states she was told she has an ulcer by her GI doctor she has an ulcer, she has been taking Omeprazole and feels better from this also. To you FYI

## 2013-08-05 NOTE — Telephone Encounter (Signed)
Patient would like to know if she needs to continue using Microzide please call her back at [phone]. Thanks

## 2013-08-06 MED ORDER — HYDROCHLOROTHIAZIDE 12.5 MG PO CAPS: ORAL_CAPSULE | ORAL | Status: DC

## 2013-08-06 NOTE — Telephone Encounter (Signed)
Yes, does she need a refill? Called her. She needs this sent in, this is done.

## 2013-08-12 ENCOUNTER — Telehealth: Payer: Self-pay

## 2013-08-12 NOTE — Telephone Encounter (Signed)
Patient notified HCTZ is prescribed for HTN. She voiced understanding.

## 2013-08-12 NOTE — Telephone Encounter (Signed)
PT STATES SHE TAKE A WATER PILL AND HER AIDE WANTED TO KNOW WHY SINCE SHE DOESN'T HAVE A PROBLEM. PT WOULD LIKE TO KNOW IF SHE SHOULD LISTEN TO THE DR OR HER AIDE PLEASE CALL [phone]

## 2013-08-20 ENCOUNTER — Telehealth: Payer: Self-pay

## 2013-08-20 DIAGNOSIS — M899 Disorder of bone, unspecified: Secondary | ICD-10-CM

## 2013-08-20 DIAGNOSIS — Z13820 Encounter for screening for osteoporosis: Secondary | ICD-10-CM

## 2013-08-20 DIAGNOSIS — E2839 Other primary ovarian failure: Secondary | ICD-10-CM

## 2013-08-20 DIAGNOSIS — Z780 Asymptomatic menopausal state: Secondary | ICD-10-CM

## 2013-08-20 NOTE — Telephone Encounter (Signed)
Patient needs a referral from [place] to [facility] for a bone density test faxed over.

## 2013-08-20 NOTE — Telephone Encounter (Signed)
Is this a screening? I don not see a history of osteoporosis/osteopenia. OK to send order to [facility] for DEXA.

## 2013-08-21 ENCOUNTER — Telehealth: Payer: Self-pay

## 2013-08-21 NOTE — Telephone Encounter (Signed)
Yes, but I can not get the computer to take the order, because diagnosis is not matching the study.

## 2013-08-25 NOTE — Telephone Encounter (Signed)
Can you help with this?

## 2013-08-26 NOTE — Telephone Encounter (Signed)
Used dx of disorder of bone or cartilege.

## 2013-09-17 DIAGNOSIS — Z1231 Encounter for screening mammogram for malignant neoplasm of breast: Secondary | ICD-10-CM | POA: Diagnosis not present

## 2013-09-17 DIAGNOSIS — M810 Age-related osteoporosis without current pathological fracture: Secondary | ICD-10-CM | POA: Diagnosis not present

## 2013-09-22 ENCOUNTER — Telehealth: Payer: Self-pay

## 2013-09-22 NOTE — Telephone Encounter (Signed)
PT WOULD LIKE RESULTS ON HER BONE DESTINY TEST. PLEASE CALL [phone]

## 2013-09-22 NOTE — Telephone Encounter (Signed)
Cont calcium and Vit. D

## 2013-09-22 NOTE — Telephone Encounter (Signed)
Have you gotten the results?

## 2013-09-28 NOTE — Telephone Encounter (Signed)
No ansewer/no vm

## 2013-09-29 NOTE — Telephone Encounter (Signed)
Pt notified of results and to cont meds

## 2013-10-07 ENCOUNTER — Encounter: Payer: Self-pay | Admitting: Emergency Medicine

## 2013-10-07 ENCOUNTER — Ambulatory Visit (INDEPENDENT_AMBULATORY_CARE_PROVIDER_SITE_OTHER): Payer: Medicare Other | Admitting: Emergency Medicine

## 2013-10-07 VITALS — BP 122/68 | HR 79 | Temp 98.6°F | Resp 16 | Ht <= 58 in | Wt 114.0 lb

## 2013-10-07 DIAGNOSIS — D649 Anemia, unspecified: Secondary | ICD-10-CM | POA: Diagnosis not present

## 2013-10-07 DIAGNOSIS — R350 Frequency of micturition: Secondary | ICD-10-CM | POA: Diagnosis not present

## 2013-10-07 DIAGNOSIS — M810 Age-related osteoporosis without current pathological fracture: Secondary | ICD-10-CM | POA: Diagnosis not present

## 2013-10-07 DIAGNOSIS — I25810 Atherosclerosis of coronary artery bypass graft(s) without angina pectoris: Secondary | ICD-10-CM

## 2013-10-07 DIAGNOSIS — I10 Essential (primary) hypertension: Secondary | ICD-10-CM | POA: Diagnosis not present

## 2013-10-07 DIAGNOSIS — H00013 Hordeolum externum right eye, unspecified eyelid: Secondary | ICD-10-CM

## 2013-10-07 DIAGNOSIS — M549 Dorsalgia, unspecified: Secondary | ICD-10-CM

## 2013-10-07 DIAGNOSIS — Z23 Encounter for immunization: Secondary | ICD-10-CM

## 2013-10-07 LAB — CBC WITH DIFFERENTIAL/PLATELET
Basophils Absolute: 0 10*3/uL (ref 0.0–0.1)
Eosinophils Absolute: 0.2 10*3/uL (ref 0.0–0.7)
Lymphocytes Relative: 24 % (ref 12–46)
Lymphs Abs: 1.2 10*3/uL (ref 0.7–4.0)
Neutro Abs: 3.3 10*3/uL (ref 1.7–7.7)
Neutrophils Relative %: 63 % (ref 43–77)
Platelets: 416 10*3/uL — ABNORMAL HIGH (ref 150–400)
RBC: 4.35 MIL/uL (ref 3.87–5.11)
RDW: 14.6 % (ref 11.5–15.5)
WBC: 5.2 10*3/uL (ref 4.0–10.5)

## 2013-10-07 LAB — POCT URINALYSIS DIPSTICK
Blood, UA: NEGATIVE
Protein, UA: NEGATIVE
Spec Grav, UA: 1.01
Urobilinogen, UA: 0.2

## 2013-10-07 LAB — COMPREHENSIVE METABOLIC PANEL
ALT: <8 U/L (ref 0–35)
AST: 11 U/L (ref 0–37)
Alkaline Phosphatase: 46 U/L (ref 39–117)
CO2: 29 mEq/L (ref 19–32)
Calcium: 9.6 mg/dL (ref 8.4–10.5)
Chloride: 104 mEq/L (ref 96–112)
Creat: 0.87 mg/dL (ref 0.50–1.10)
Sodium: 141 mEq/L (ref 135–145)
Total Bilirubin: 0.4 mg/dL (ref 0.3–1.2)
Total Protein: 6.5 g/dL (ref 6.0–8.3)

## 2013-10-07 LAB — LIPID PANEL
Cholesterol: 216 mg/dL — ABNORMAL HIGH (ref 0–200)
LDL Cholesterol: 117 mg/dL — ABNORMAL HIGH (ref 0–99)
Total CHOL/HDL Ratio: 2.6 Ratio
VLDL: 17 mg/dL (ref 0–40)

## 2013-10-07 LAB — POCT UA - MICROSCOPIC ONLY
Crystals, Ur, HPF, POC: NEGATIVE
Yeast, UA: NEGATIVE

## 2013-10-07 MED ORDER — TOBRAMYCIN 0.3 % OP OINT: TOPICAL_OINTMENT | Freq: Three times a day (TID) | OPHTHALMIC | Status: DC

## 2013-10-07 NOTE — Progress Notes (Signed)
  Subjective:    Patient ID: [name], female    DOB: 01-18-1933, 79 y.o.   MRN: [id number]  HPI patient enters with multiple issues. She continues to be having problems with her signed who has a drinking problem. She has persistent neck and back pain. She has trouble in her right arm where she had had an amputation of her thumb and index finger due to infection. She denies any chest pain or shortness of breath. She drinks boost about 2 times a week. She is concerned   as to the possibility she is taking too many vitamins and supplements . History of cataracts and has been having some mild discomfort in her right eye.     Review of Systems     Objective:   Physical Exam there are bilateral cataracts noted. Her neck is supple without adenopathy. She has a severe significant kyphosis scoliotic curve of her thoracic and lumbar spine. Cardiac is regular rate without murmurs abdomen soft nontender        Assessment & Plan:  We'll try and give the results of her mammogram and bone density done at [facility].

## 2013-10-08 ENCOUNTER — Telehealth: Payer: Self-pay

## 2013-10-08 NOTE — Telephone Encounter (Signed)
Patient wants the results from her specimen.   [phone]

## 2013-10-08 NOTE — Telephone Encounter (Signed)
Called her, see lab.

## 2013-10-15 ENCOUNTER — Emergency Department (HOSPITAL_COMMUNITY)
Admission: EM | Admit: 2013-10-15 | Discharge: 2013-10-15 | Disposition: A | Discharge status: Home / Self Care | Payer: Medicare Other | Attending: Emergency Medicine | Admitting: Emergency Medicine

## 2013-10-15 ENCOUNTER — Emergency Department (HOSPITAL_COMMUNITY): Payer: Medicare Other

## 2013-10-15 ENCOUNTER — Encounter (HOSPITAL_COMMUNITY): Payer: Self-pay | Admitting: Emergency Medicine

## 2013-10-15 ENCOUNTER — Telehealth: Payer: Self-pay

## 2013-10-15 DIAGNOSIS — I2510 Atherosclerotic heart disease of native coronary artery without angina pectoris: Secondary | ICD-10-CM | POA: Diagnosis not present

## 2013-10-15 DIAGNOSIS — M67919 Unspecified disorder of synovium and tendon, unspecified shoulder: Secondary | ICD-10-CM | POA: Insufficient documentation

## 2013-10-15 DIAGNOSIS — G43909 Migraine, unspecified, not intractable, without status migrainosus: Secondary | ICD-10-CM | POA: Diagnosis not present

## 2013-10-15 DIAGNOSIS — Z792 Long term (current) use of antibiotics: Secondary | ICD-10-CM | POA: Insufficient documentation

## 2013-10-15 DIAGNOSIS — Z79899 Other long term (current) drug therapy: Secondary | ICD-10-CM | POA: Diagnosis not present

## 2013-10-15 DIAGNOSIS — F0390 Unspecified dementia without behavioral disturbance: Secondary | ICD-10-CM | POA: Diagnosis not present

## 2013-10-15 DIAGNOSIS — M5030 Other cervical disc degeneration, unspecified cervical region: Secondary | ICD-10-CM | POA: Insufficient documentation

## 2013-10-15 DIAGNOSIS — M47812 Spondylosis without myelopathy or radiculopathy, cervical region: Secondary | ICD-10-CM | POA: Diagnosis not present

## 2013-10-15 DIAGNOSIS — Z8619 Personal history of other infectious and parasitic diseases: Secondary | ICD-10-CM | POA: Diagnosis not present

## 2013-10-15 DIAGNOSIS — I10 Essential (primary) hypertension: Secondary | ICD-10-CM | POA: Diagnosis not present

## 2013-10-15 DIAGNOSIS — Z7982 Long term (current) use of aspirin: Secondary | ICD-10-CM | POA: Insufficient documentation

## 2013-10-15 DIAGNOSIS — Z87448 Personal history of other diseases of urinary system: Secondary | ICD-10-CM | POA: Insufficient documentation

## 2013-10-15 DIAGNOSIS — M810 Age-related osteoporosis without current pathological fracture: Secondary | ICD-10-CM | POA: Insufficient documentation

## 2013-10-15 DIAGNOSIS — M75100 Unspecified rotator cuff tear or rupture of unspecified shoulder, not specified as traumatic: Secondary | ICD-10-CM | POA: Diagnosis not present

## 2013-10-15 DIAGNOSIS — M19019 Primary osteoarthritis, unspecified shoulder: Secondary | ICD-10-CM | POA: Diagnosis not present

## 2013-10-15 DIAGNOSIS — K219 Gastro-esophageal reflux disease without esophagitis: Secondary | ICD-10-CM | POA: Insufficient documentation

## 2013-10-15 DIAGNOSIS — H409 Unspecified glaucoma: Secondary | ICD-10-CM | POA: Insufficient documentation

## 2013-10-15 DIAGNOSIS — M719 Bursopathy, unspecified: Secondary | ICD-10-CM | POA: Insufficient documentation

## 2013-10-15 DIAGNOSIS — IMO0002 Reserved for concepts with insufficient information to code with codable children: Secondary | ICD-10-CM | POA: Diagnosis not present

## 2013-10-15 DIAGNOSIS — M67912 Unspecified disorder of synovium and tendon, left shoulder: Secondary | ICD-10-CM

## 2013-10-15 DIAGNOSIS — M542 Cervicalgia: Secondary | ICD-10-CM

## 2013-10-15 MED ORDER — HYDROCODONE-ACETAMINOPHEN 5-325 MG PO TABS
1.0000 | ORAL_TABLET | Freq: Once | ORAL | Status: AC
  Administered 2013-10-15: 1 via ORAL
  Filled 2013-10-15: qty 1

## 2013-10-15 MED ORDER — HYDROCODONE-ACETAMINOPHEN 5-325 MG PO TABS: 1.0000 | ORAL_TABLET | ORAL | Status: DC | PRN

## 2013-10-15 NOTE — ED Provider Notes (Signed)
CSN: [id number]     Arrival date & time 10/15/13  1441 History   First MD Initiated Contact with Patient 10/15/13 1658     Chief Complaint  Patient presents with  . neck stiffness   . Shoulder Pain    left    HPI Pt states she is having pain in her neck and shoulder on the left side.  It has been ongoing for the last week.  Her doctor's office is usually really busy so she wanted to come to the ED because we can do more tests.  The pain is sharp.  It increases with movement and trying to lift her hand.  Nothing seems to make it better.  No falls or recent injury.  No trouble with fever.  No trouble with balance or speech.  No CP or SOB. Past Medical History  Diagnosis Date  . Glaucoma   . Dementia   . Spondylosis   . Foot deformity   . Migraine   . CAD (coronary artery disease)   . Elevated BP   . Diverticulosis   . H/O: GI bleed   . Acute renal insufficiency   . GERD (gastroesophageal reflux disease)   . Barrett's esophagus   . Hyperglycemia   . HSV-2 (herpes simplex virus 2) infection   . Abnormal finding on Pap smear, ASCUS   . Osteoporosis     worsening  . Amputation finger     index  . Hypertension    Past Surgical History  Procedure Laterality Date  . Partial hysterectomy    . Amputation finger / thumb      secondary to osteomyelitis  . Foot surgery      right  . Upper gastrointestinal endoscopy    . Colonoscopy     Family History  Problem Relation Age of Onset  . Breast cancer Mother   . Heart attack Mother   . Heart disease Father   . Alcohol abuse Son   . Colon cancer Neg Hx   . Stomach cancer Neg Hx   . Rectal cancer Neg Hx    History  Substance Use Topics  . Smoking status: Never Smoker   . Smokeless tobacco: Never Used  . Alcohol Use: No   OB History   Grav Para Term Preterm Abortions TAB SAB Ect Mult Living                 Review of Systems  Constitutional: Negative for fever.  Respiratory: Negative for shortness of breath.    Genitourinary: Negative for dysuria.  Neurological: Negative for seizures and numbness.  All other systems reviewed and are negative.    Allergies  Review of patient's allergies indicates no known allergies.  Home Medications   Current Outpatient Rx  Name  Route  Sig  Dispense  Refill  . aspirin 81 MG tablet   Oral   Take 81 mg by mouth daily.           . betamethasone dipropionate (DIPROLENE) 0.05 % cream   Topical   Apply topically 2 (two) times daily.   45 g   1   . calcium carbonate (CALCIUM 500) 1250 MG tablet   Oral   Take 3 tablets by mouth daily.          . Cholecalciferol (VITAMIN D-3 PO)   Oral   Take 4,500 mg by mouth daily.           Marland Kitchen conjugated estrogens (PREMARIN) vaginal cream  Vaginal   Place vaginally 2 (two) times a week.           . desonide (DESOWEN) 0.05 % cream   Topical   Apply topically 2 (two) times daily.           . ferrous sulfate 325 (65 FE) MG tablet   Oral   Take 325 mg by mouth daily with breakfast.           . gabapentin (NEURONTIN) 800 MG tablet      take 1 tablet by mouth at bedtime   90 tablet   1   . hydrochlorothiazide (MICROZIDE) 12.5 MG capsule      take 1 tablet by mouth ON TUESDAY AND SATURDAY EACH WEEK   20 capsule   2   . hydrOXYzine (ATARAX/VISTARIL) 10 MG tablet   Oral   Take 10-30 mg by mouth every 4 (four) hours as needed for itching.          . metoprolol succinate (TOPROL-XL) 50 MG 24 hr tablet   Oral   Take 1 tablet (50 mg total) by mouth daily. Needs office visit (second notice)   30 tablet   11   . Multiple Vitamins-Minerals (EQL CENTRAL-VITE SELECT PO)   Oral   Take by mouth.           . Multiple Vitamins-Minerals (HAIR/SKIN/NAILS PO)   Oral   Take by mouth daily.           Marland Kitchen nystatin (MYCOSTATIN) cream   Topical   Apply topically as needed.           Marland Kitchen omeprazole (PRILOSEC) 40 MG capsule   Oral   Take 1 capsule (40 mg total) by mouth daily.   60 capsule    11   . ondansetron (ZOFRAN) 4 MG tablet   Oral   Take 1 tablet (4 mg total) by mouth daily as needed for nausea.   30 tablet   1   . polyethylene glycol powder (MIRALAX) powder   Oral   Take 17 g by mouth as directed.   255 g   0     Take every other day   . prochlorperazine (COMPAZINE) 10 MG tablet   Oral   Take 0.5 tablets (5 mg total) by mouth every 6 (six) hours as needed.   30 tablet   1   . promethazine (PHENERGAN) 25 MG tablet   Oral   Take 1 tablet (25 mg total) by mouth every 6 (six) hours as needed for nausea.   30 tablet   3   . tobramycin (TOBREX) 0.3 % ophthalmic ointment   Right Eye   Place into the right eye 3 (three) times daily.   3.5 g   0   . traMADol-acetaminophen (ULTRACET) 37.5-325 MG per tablet               . verapamil (CALAN-SR) 240 MG CR tablet   Oral   Take 1 tablet (240 mg total) by mouth every morning.   30 tablet   11   . HYDROcodone-acetaminophen (NORCO/VICODIN) 5-325 MG per tablet   Oral   Take 1 tablet by mouth every 4 (four) hours as needed for pain.   20 tablet   0    BP 149/81  Pulse 83  Temp(Src) 98.1 F (36.7 C) (Oral)  Resp 18  Wt 113 lb 6 oz (51.427 kg)  BMI 23.9 kg/m2  SpO2 97% Physical Exam  Nursing note and  vitals reviewed. Constitutional: She appears well-developed and well-nourished. No distress.  HENT:  Head: Normocephalic and atraumatic.  Right Ear: External ear normal.  Left Ear: External ear normal.  Eyes: Conjunctivae are normal. Right eye exhibits no discharge. Left eye exhibits no discharge. No scleral icterus.  Neck: Neck supple. No tracheal deviation present.  Cardiovascular: Normal rate, regular rhythm and intact distal pulses.   Pulmonary/Chest: Effort normal and breath sounds normal. No stridor. No respiratory distress. She has no wheezes. She has no rales.  Abdominal: Soft. Bowel sounds are normal. She exhibits no distension. There is no tenderness. There is no rebound and no guarding.   Musculoskeletal: She exhibits no edema.       Left shoulder: She exhibits tenderness. She exhibits no swelling, no spasm and normal pulse.       Cervical back: She exhibits tenderness. She exhibits no swelling, no edema and no deformity.  Mild ttp left paraspinal and trapezius muscle  Neurological: She is alert. She has normal strength. No sensory deficit. Cranial nerve deficit:  no gross defecits noted. She exhibits normal muscle tone. She displays no seizure activity. Coordination normal.  Skin: Skin is warm and dry. No rash noted.  Psychiatric: She has a normal mood and affect.    ED Course  Procedures (including critical care time) Labs Review Labs Reviewed - No data to display Imaging Review Dg Cervical Spine Complete  10/15/2013   CLINICAL DATA:  Shoulder pain.  EXAM: CERVICAL SPINE  4+ VIEWS  COMPARISON:  None.  FINDINGS: Advanced degenerative changes in the cervical spine. There appears to be erosions at the base of the dens. The odontoid is not well visualized. Slight subluxation of C2 on C3. I suspect all of this is related to severe arthritic changes, but it is difficult by plain film to exclude acute injury/fracture. Recommend further evaluation with CT.  IMPRESSION: Advanced degenerative changes. Probable erosions at the base of the odontoid, but the odontoid is not well visualized. Recommend further evaluation with CT.   Electronically Signed   By: Charlett Nose M.D.   On: 10/15/2013 18:05   Ct Cervical Spine Wo Contrast  10/15/2013   CLINICAL DATA:  Neck pain with left shoulder pain  EXAM: CT CERVICAL SPINE WITHOUT CONTRAST  TECHNIQUE: Multidetector CT imaging of the cervical spine was performed without intravenous contrast. Multiplanar CT image reconstructions were also generated.  COMPARISON:  Cervical radiographs 10/15/2013  FINDINGS: Moderate dextroscoliosis in the cervical spine. Negative for fracture or mass lesion. There is basilar invagination with the dens projecting into  the skullbase. This does not appear to be causing significant spinal stenosis at the craniocervical junction.  11 mm left thyroid nodule.  C2-3: Mild disk degeneration  C3-4: Disc degeneration and spondylosis without significant spinal stenosis  C4-5: Disc degeneration and spondylosis without significant spinal stenosis. Mild facet degeneration  C5-6: Disc degeneration and spondylosis with left-sided facet hypertrophy. No significant spinal stenosis  C6-7:  Mild disc degeneration without stenosis  C7-T1: Mild disc degeneration.  IMPRESSION: Cervical scoliosis. 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  Cervical spondylosis without significant spinal stenosis.   Electronically Signed   By: Marlan Palau M.D.   On: 10/15/2013 19:48   Dg Shoulder Left  10/15/2013   CLINICAL DATA:  Left shoulder pain.  EXAM: LEFT SHOULDER - 2+ VIEW  COMPARISON:  06/24/2013.  FINDINGS: High riding humeral head which appears intimately associated with the overlying acromion which appears are remodeled, indicative of  longstanding rotator cuff pathology. Marked joint space narrowing, subchondral sclerosis, subchondral cyst formation and osteophyte formation in the glenohumeral joint, compatible with advanced osteoarthritis. Multiple tiny densities in the glenohumeral joint likely represent loose bodies. No acute displaced fracture. No dislocation. Advanced degenerative changes of osteoarthritis are also noted at the acromioclavicular joint, with some inferior subluxation of the scapula relative to the distal clavicle.  IMPRESSION: 1. Advanced degenerative changes in left shoulder, as detailed above, including evidence of longstanding rotator cuff pathology is well as advanced osteoarthritis of the glenohumeral and acromioclavicular joints.   Electronically Signed   By: Trudie Reed M.D.   On: 10/15/2013 18:10    EKG Interpretation     Ventricular Rate:  81 PR Interval:  156 QRS  Duration: 85 QT Interval:  397 QTC Calculation: 461 R Axis:   30 Text Interpretation:  Sinus rhythm Consider left ventricular hypertrophy No significant change since last tracing            MDM   1. Rotator cuff disorder, left   2. Degenerative cervical disc    Symptoms related to rotator cuff syndome and possibly the degenerative disc disease in her neck.  Will dc home with pain medications.  Doubt referred cardiac symptoms    Celene Kras, MD 10/15/13 2001

## 2013-10-15 NOTE — ED Notes (Signed)
Pt states that for approx week now she has neck stiffness and shoulder pain/stiffness that she cant hardly get dressed.

## 2013-10-15 NOTE — ED Notes (Signed)
MD at bedside. 

## 2013-10-15 NOTE — Telephone Encounter (Signed)
Called her, she needs to call 911 if she can not come in here. She complains of severe headache. Aide has told her she is unsteady, and shaking all over. Advised her to go via ambulance to the hospital.

## 2013-10-15 NOTE — Telephone Encounter (Signed)
Patient states that she is having diarrhea, headache and pain/tingling in her arm. States that she believes it may be from one of her medications. Called to nurses station to see if someone could advise patient however I was unable to get an answer. Advised patient to come in to our office or go to ED. Patient stated that she will try to find a way here being that she relies on public transportation.   [phone]

## 2013-10-16 NOTE — Telephone Encounter (Signed)
Patient went to the emergency room and they released her with neck pain. Please call and get her an appointment to see one of the orthopedists. She's had surgery on her hand before and it would be good to make her the appointment with that orthopedic group.

## 2013-10-16 NOTE — Telephone Encounter (Signed)
Referral made. Will call her.

## 2013-10-16 NOTE — Telephone Encounter (Signed)
I called her, she states she wants to rake leaves, I have encouraged her to not rake the leaves.  Hand surgeon is Dr Amanda Pea, has seen Dr Ethelene Hal also for her back, advised her we will send the referral to Dr Ethelene Hal. Lupita Leash advised to send the referral to Dr Ethelene Hal

## 2013-10-20 ENCOUNTER — Telehealth: Payer: Self-pay | Admitting: Gastroenterology

## 2013-10-20 MED ORDER — OMEPRAZOLE 40 MG PO CPDR: 40.0000 mg | DELAYED_RELEASE_CAPSULE | Freq: Every day | ORAL | Status: DC

## 2013-10-20 NOTE — Telephone Encounter (Signed)
Medication sent to pharmacy  

## 2013-10-24 ENCOUNTER — Telehealth: Payer: Self-pay

## 2013-10-24 NOTE — Telephone Encounter (Signed)
Dr.Daub, Pt would like to know if you could write her a rx for hydrcodone.  Best# [phone]

## 2013-10-25 NOTE — Telephone Encounter (Signed)
Okay to call in a limited number of pain pills. She can have Norco 5/2 tablet every 4-6 hours as needed for pain she can have #20 pills no

## 2013-10-27 MED ORDER — HYDROCODONE-ACETAMINOPHEN 5-325 MG PO TABS: 1.0000 | ORAL_TABLET | ORAL | Status: DC | PRN

## 2013-10-27 NOTE — Telephone Encounter (Signed)
Please advise on pt's request for hydrocodone with acetaminophen.

## 2013-10-27 NOTE — Telephone Encounter (Signed)
Per Dr. Ellis Parents response below rx for #20 norco no RF is signed at TL desk to be picked up by patient.

## 2013-10-27 NOTE — Telephone Encounter (Signed)
PT STATES SHE WAS GIVEN SOME HYDROCODONE ACETAMINOPHEN FROM THE HOSPITAL AND THAT WAS THE BEST PAIN MEDICINE SHE HAS HAD AND WOULD LIKE TO KNOW IF DR DAUB COULD PRESCRIBE THAT TO HER. PLEASE CALL [phone]

## 2013-10-28 NOTE — Telephone Encounter (Signed)
Patient notified and voiced understanding.

## 2013-11-03 ENCOUNTER — Telehealth: Payer: Self-pay

## 2013-11-03 NOTE — Telephone Encounter (Signed)
Patient would like information about a referral please call her at [phone]

## 2013-11-03 NOTE — Telephone Encounter (Signed)
Called her, she states she has appt with Dr Ethelene Hal. She wants to know what this is for. Advised her just office visit with him, not for injections.

## 2013-11-03 NOTE — Telephone Encounter (Signed)
PT HAS AN APPT WITH DR RAMOS AT 11/10/13 AT 130

## 2013-11-05 ENCOUNTER — Other Ambulatory Visit: Payer: Self-pay | Admitting: Physician Assistant

## 2013-11-10 DIAGNOSIS — M545 Low back pain: Secondary | ICD-10-CM | POA: Diagnosis not present

## 2013-11-10 DIAGNOSIS — M5030 Other cervical disc degeneration, unspecified cervical region: Secondary | ICD-10-CM | POA: Diagnosis not present

## 2013-11-18 ENCOUNTER — Encounter: Payer: Self-pay | Admitting: Emergency Medicine

## 2013-11-18 NOTE — Telephone Encounter (Signed)
error 

## 2013-11-19 ENCOUNTER — Encounter: Payer: Self-pay | Admitting: Emergency Medicine

## 2013-12-01 ENCOUNTER — Telehealth: Payer: Self-pay | Admitting: Gastroenterology

## 2013-12-01 NOTE — Telephone Encounter (Signed)
Pt wanted to know if she could eat raisins since she had ulcers in the past. Discussed with pt that she could eat the raisins that it should not be a problem.

## 2014-01-05 DIAGNOSIS — M6530 Trigger finger, unspecified finger: Secondary | ICD-10-CM | POA: Diagnosis not present

## 2014-01-05 DIAGNOSIS — M79609 Pain in unspecified limb: Secondary | ICD-10-CM | POA: Diagnosis not present

## 2014-01-05 DIAGNOSIS — M545 Low back pain, unspecified: Secondary | ICD-10-CM | POA: Diagnosis not present

## 2014-01-05 DIAGNOSIS — M5030 Other cervical disc degeneration, unspecified cervical region: Secondary | ICD-10-CM | POA: Diagnosis not present

## 2014-01-07 DIAGNOSIS — H2510 Age-related nuclear cataract, unspecified eye: Secondary | ICD-10-CM | POA: Diagnosis not present

## 2014-01-07 DIAGNOSIS — H4011X0 Primary open-angle glaucoma, stage unspecified: Secondary | ICD-10-CM | POA: Diagnosis not present

## 2014-01-07 DIAGNOSIS — H04129 Dry eye syndrome of unspecified lacrimal gland: Secondary | ICD-10-CM | POA: Diagnosis not present

## 2014-01-22 DIAGNOSIS — M6530 Trigger finger, unspecified finger: Secondary | ICD-10-CM | POA: Diagnosis not present

## 2014-02-03 DIAGNOSIS — L293 Anogenital pruritus, unspecified: Secondary | ICD-10-CM | POA: Diagnosis not present

## 2014-02-03 DIAGNOSIS — N952 Postmenopausal atrophic vaginitis: Secondary | ICD-10-CM | POA: Diagnosis not present

## 2014-02-03 DIAGNOSIS — Z01419 Encounter for gynecological examination (general) (routine) without abnormal findings: Secondary | ICD-10-CM | POA: Diagnosis not present

## 2014-02-03 DIAGNOSIS — Z124 Encounter for screening for malignant neoplasm of cervix: Secondary | ICD-10-CM | POA: Diagnosis not present

## 2014-02-05 ENCOUNTER — Other Ambulatory Visit: Payer: Self-pay | Admitting: Emergency Medicine

## 2014-02-09 ENCOUNTER — Ambulatory Visit (INDEPENDENT_AMBULATORY_CARE_PROVIDER_SITE_OTHER): Payer: Medicare Other | Admitting: Podiatry

## 2014-02-09 ENCOUNTER — Encounter: Payer: Self-pay | Admitting: Podiatry

## 2014-02-09 VITALS — BP 142/92 | HR 79 | Resp 18

## 2014-02-09 DIAGNOSIS — L738 Other specified follicular disorders: Secondary | ICD-10-CM

## 2014-02-09 NOTE — Progress Notes (Signed)
° °  Subjective:    Patient ID: [name], female    DOB: [birth year], 80 y.o.   MRN: [id number]  HPI my feet hurt on the sides and sore and tender and my heels hurt some and throbbing and hurts to walk and they crack and peeling and dry. Patient presents primarily because of painful skin fissuring.    Review of Systems  Eyes:       Glaucoma  Gastrointestinal:       Ulcers   All other systems reviewed and are negative.       Objective:   Physical Exam  Orientated x3 black female  Vascular: The DP is are 2/4 bilaterally. PTs are 1/4 bilaterally  Neurological : Trace reactive knee and ankle reflexes bilaterally  Dermatological: Fissuring noted heel fifth MPJ area bilaterally.  Musculoskeletal: Advanced HAV deformities and crossover second toes bilaterally      Assessment & Plan:   Assessment: Xerosis feet bilaterally HAV and hammered second digits bilaterally  Plan: Patient advised to rub cocoa butter injured feet twice a day. Also recommended soft spandex shoes with elbow closures to accommodate foot deformities. Reappoint at patient's request.

## 2014-02-09 NOTE — Patient Instructions (Signed)
Rub cocoa butter with shea on the skin on your feet twice a day. Purchase a soft spandex shoe at the shoe market

## 2014-02-10 ENCOUNTER — Ambulatory Visit: Payer: Medicare Other | Admitting: Emergency Medicine

## 2014-02-23 ENCOUNTER — Telehealth: Payer: Self-pay | Admitting: Gastroenterology

## 2014-02-23 NOTE — Telephone Encounter (Signed)
Pt states she has been nauseated for the past couple of days. Thinks she may have a "bug" that has been going around. Discussed with pt that she has phenergan on her med list, she states she will try this and call us back if she continues to have problems.

## 2014-02-26 ENCOUNTER — Emergency Department (HOSPITAL_COMMUNITY): Payer: Medicare Other

## 2014-02-26 ENCOUNTER — Emergency Department (HOSPITAL_COMMUNITY)
Admission: EM | Admit: 2014-02-26 | Discharge: 2014-02-26 | Disposition: A | Discharge status: Home / Self Care | Payer: Medicare Other | Attending: Emergency Medicine | Admitting: Emergency Medicine

## 2014-02-26 ENCOUNTER — Encounter (HOSPITAL_COMMUNITY): Payer: Self-pay | Admitting: Emergency Medicine

## 2014-02-26 DIAGNOSIS — K2270 Barrett's esophagus without dysplasia: Secondary | ICD-10-CM | POA: Diagnosis not present

## 2014-02-26 DIAGNOSIS — H409 Unspecified glaucoma: Secondary | ICD-10-CM | POA: Insufficient documentation

## 2014-02-26 DIAGNOSIS — Z87448 Personal history of other diseases of urinary system: Secondary | ICD-10-CM | POA: Insufficient documentation

## 2014-02-26 DIAGNOSIS — N390 Urinary tract infection, site not specified: Secondary | ICD-10-CM | POA: Diagnosis not present

## 2014-02-26 DIAGNOSIS — K449 Diaphragmatic hernia without obstruction or gangrene: Secondary | ICD-10-CM | POA: Diagnosis not present

## 2014-02-26 DIAGNOSIS — M810 Age-related osteoporosis without current pathological fracture: Secondary | ICD-10-CM | POA: Insufficient documentation

## 2014-02-26 DIAGNOSIS — I2510 Atherosclerotic heart disease of native coronary artery without angina pectoris: Secondary | ICD-10-CM | POA: Insufficient documentation

## 2014-02-26 DIAGNOSIS — R5381 Other malaise: Secondary | ICD-10-CM | POA: Diagnosis not present

## 2014-02-26 DIAGNOSIS — Z87828 Personal history of other (healed) physical injury and trauma: Secondary | ICD-10-CM | POA: Diagnosis not present

## 2014-02-26 DIAGNOSIS — R112 Nausea with vomiting, unspecified: Secondary | ICD-10-CM | POA: Insufficient documentation

## 2014-02-26 DIAGNOSIS — F0390 Unspecified dementia without behavioral disturbance: Secondary | ICD-10-CM | POA: Diagnosis not present

## 2014-02-26 DIAGNOSIS — Z792 Long term (current) use of antibiotics: Secondary | ICD-10-CM | POA: Diagnosis not present

## 2014-02-26 DIAGNOSIS — G43909 Migraine, unspecified, not intractable, without status migrainosus: Secondary | ICD-10-CM | POA: Diagnosis not present

## 2014-02-26 DIAGNOSIS — Z90710 Acquired absence of both cervix and uterus: Secondary | ICD-10-CM | POA: Insufficient documentation

## 2014-02-26 DIAGNOSIS — I10 Essential (primary) hypertension: Secondary | ICD-10-CM | POA: Diagnosis not present

## 2014-02-26 DIAGNOSIS — K219 Gastro-esophageal reflux disease without esophagitis: Secondary | ICD-10-CM | POA: Diagnosis not present

## 2014-02-26 DIAGNOSIS — K7689 Other specified diseases of liver: Secondary | ICD-10-CM | POA: Diagnosis not present

## 2014-02-26 DIAGNOSIS — R5383 Other fatigue: Secondary | ICD-10-CM

## 2014-02-26 DIAGNOSIS — Z7982 Long term (current) use of aspirin: Secondary | ICD-10-CM | POA: Insufficient documentation

## 2014-02-26 DIAGNOSIS — Z79899 Other long term (current) drug therapy: Secondary | ICD-10-CM | POA: Insufficient documentation

## 2014-02-26 DIAGNOSIS — Z8619 Personal history of other infectious and parasitic diseases: Secondary | ICD-10-CM | POA: Diagnosis not present

## 2014-02-26 DIAGNOSIS — M479 Spondylosis, unspecified: Secondary | ICD-10-CM | POA: Insufficient documentation

## 2014-02-26 DIAGNOSIS — K5730 Diverticulosis of large intestine without perforation or abscess without bleeding: Secondary | ICD-10-CM | POA: Insufficient documentation

## 2014-02-26 LAB — COMPREHENSIVE METABOLIC PANEL
ALT: 7 U/L (ref 0–35)
AST: 10 U/L (ref 0–37)
Albumin: 3 g/dL — ABNORMAL LOW (ref 3.5–5.2)
Alkaline Phosphatase: 52 U/L (ref 39–117)
BUN: 11 mg/dL (ref 6–23)
CO2: 27 mEq/L (ref 19–32)
Calcium: 8.9 mg/dL (ref 8.4–10.5)
Chloride: 104 mEq/L (ref 96–112)
Creatinine, Ser: 0.9 mg/dL (ref 0.50–1.10)
GFR calc Af Amer: 69 mL/min — ABNORMAL LOW (ref >90)
GFR calc non Af Amer: 60 mL/min — ABNORMAL LOW (ref >90)
Glucose, Bld: 98 mg/dL (ref 70–99)
Potassium: 3.9 mEq/L (ref 3.7–5.3)
Sodium: 143 mEq/L (ref 137–147)
Total Bilirubin: 0.4 mg/dL (ref 0.3–1.2)
Total Protein: 6.3 g/dL (ref 6.0–8.3)

## 2014-02-26 LAB — URINALYSIS, ROUTINE W REFLEX MICROSCOPIC
Bilirubin Urine: NEGATIVE
Glucose, UA: NEGATIVE mg/dL
Ketones, ur: NEGATIVE mg/dL
Nitrite: POSITIVE — AB
Protein, ur: NEGATIVE mg/dL
Specific Gravity, Urine: 1.011 (ref 1.005–1.030)
Urobilinogen, UA: 0.2 mg/dL (ref 0.0–1.0)
pH: 6.5 (ref 5.0–8.0)

## 2014-02-26 LAB — CBC WITH DIFFERENTIAL/PLATELET
Basophils Absolute: 0 10*3/uL (ref 0.0–0.1)
Basophils Relative: 1 % (ref 0–1)
Eosinophils Absolute: 0.2 10*3/uL (ref 0.0–0.7)
Eosinophils Relative: 2 % (ref 0–5)
HCT: 37.1 % (ref 36.0–46.0)
Hemoglobin: 12.7 g/dL (ref 12.0–15.0)
Lymphocytes Relative: 11 % — ABNORMAL LOW (ref 12–46)
Lymphs Abs: 0.8 10*3/uL (ref 0.7–4.0)
MCH: 29.5 pg (ref 26.0–34.0)
MCHC: 34.2 g/dL (ref 30.0–36.0)
MCV: 86.3 fL (ref 78.0–100.0)
Monocytes Absolute: 0.6 10*3/uL (ref 0.1–1.0)
Monocytes Relative: 9 % (ref 3–12)
Neutro Abs: 5.7 10*3/uL (ref 1.7–7.7)
Neutrophils Relative %: 78 % — ABNORMAL HIGH (ref 43–77)
Platelets: 297 10*3/uL (ref 150–400)
RBC: 4.3 MIL/uL (ref 3.87–5.11)
RDW: 13.3 % (ref 11.5–15.5)
WBC: 7.3 10*3/uL (ref 4.0–10.5)

## 2014-02-26 LAB — URINE MICROSCOPIC-ADD ON

## 2014-02-26 MED ORDER — SODIUM CHLORIDE 0.9 % IV BOLUS (SEPSIS)
500.0000 mL | Freq: Once | INTRAVENOUS | Status: AC
  Administered 2014-02-26: 500 mL via INTRAVENOUS

## 2014-02-26 MED ORDER — IOHEXOL 300 MG/ML  SOLN
50.0000 mL | Freq: Once | INTRAMUSCULAR | Status: AC | PRN
  Administered 2014-02-26: 50 mL via ORAL

## 2014-02-26 MED ORDER — ONDANSETRON HCL 4 MG PO TABS: 4.0000 mg | ORAL_TABLET | Freq: Four times a day (QID) | ORAL | Status: DC

## 2014-02-26 MED ORDER — IOHEXOL 300 MG/ML  SOLN
100.0000 mL | Freq: Once | INTRAMUSCULAR | Status: AC | PRN
  Administered 2014-02-26: 100 mL via INTRAVENOUS

## 2014-02-26 MED ORDER — CEPHALEXIN 500 MG PO CAPS: 500.0000 mg | ORAL_CAPSULE | Freq: Four times a day (QID) | ORAL | Status: DC

## 2014-02-26 MED ORDER — DEXTROSE 5 % IV SOLN
1.0000 g | Freq: Once | INTRAVENOUS | Status: AC
  Administered 2014-02-26: 1 g via INTRAVENOUS
  Filled 2014-02-26: qty 10

## 2014-02-26 MED ORDER — ONDANSETRON HCL 4 MG/2ML IJ SOLN
4.0000 mg | Freq: Once | INTRAMUSCULAR | Status: AC
  Administered 2014-02-26: 4 mg via INTRAVENOUS
  Filled 2014-02-26: qty 2

## 2014-02-26 NOTE — ED Notes (Signed)
Pt drinking contrast. 

## 2014-02-26 NOTE — ED Notes (Signed)
Per pt, states N/V for 3-4 days-unable to keep food/water down

## 2014-02-26 NOTE — Discharge Instructions (Signed)
Nausea and Vomiting Nausea is a sick feeling that often comes before throwing up (vomiting). Vomiting is a reflex where stomach contents come out of your mouth. Vomiting can cause severe loss of body fluids (dehydration). Children and elderly adults can become dehydrated quickly, especially if they also have diarrhea. Nausea and vomiting are symptoms of a condition or disease. It is important to find the cause of your symptoms. CAUSES   Direct irritation of the stomach lining. This irritation can result from increased acid production (gastroesophageal reflux disease), infection, food poisoning, taking certain medicines (such as nonsteroidal anti-inflammatory drugs), alcohol use, or tobacco use.  Signals from the brain.These signals could be caused by a headache, heat exposure, an inner ear disturbance, increased pressure in the brain from injury, infection, a tumor, or a concussion, pain, emotional stimulus, or metabolic problems.  An obstruction in the gastrointestinal tract (bowel obstruction).  Illnesses such as diabetes, hepatitis, gallbladder problems, appendicitis, kidney problems, cancer, sepsis, atypical symptoms of a heart attack, or eating disorders.  Medical treatments such as chemotherapy and radiation.  Receiving medicine that makes you sleep (general anesthetic) during surgery. DIAGNOSIS Your caregiver may ask for tests to be done if the problems do not improve after a few days. Tests may also be done if symptoms are severe or if the reason for the nausea and vomiting is not clear. Tests may include:  Urine tests.  Blood tests.  Stool tests.  Cultures (to look for evidence of infection).  X-rays or other imaging studies. Test results can help your caregiver make decisions about treatment or the need for additional tests. TREATMENT You need to stay well hydrated. Drink frequently but in small amounts.You may wish to drink water, sports drinks, clear broth, or eat frozen  ice pops or gelatin dessert to help stay hydrated.When you eat, eating slowly may help prevent nausea.There are also some antinausea medicines that may help prevent nausea. HOME CARE INSTRUCTIONS   Take all medicine as directed by your caregiver.  If you do not have an appetite, do not force yourself to eat. However, you must continue to drink fluids.  If you have an appetite, eat a normal diet unless your caregiver tells you differently.  Eat a variety of complex carbohydrates (rice, wheat, potatoes, bread), lean meats, yogurt, fruits, and vegetables.  Avoid high-fat foods because they are more difficult to digest.  Drink enough water and fluids to keep your urine clear or pale yellow.  If you are dehydrated, ask your caregiver for specific rehydration instructions. Signs of dehydration may include:  Severe thirst.  Dry lips and mouth.  Dizziness.  Dark urine.  Decreasing urine frequency and amount.  Confusion.  Rapid breathing or pulse. SEEK IMMEDIATE MEDICAL CARE IF:   You have blood or brown flecks (like coffee grounds) in your vomit.  You have black or bloody stools.  You have a severe headache or stiff neck.  You are confused.  You have severe abdominal pain.  You have chest pain or trouble breathing.  You do not urinate at least once every 8 hours.  You develop cold or clammy skin.  You continue to vomit for longer than 24 to 48 hours.  You have a fever. MAKE SURE YOU:   Understand these instructions.  Will watch your condition.  Will get help right away if you are not doing well or get worse. Document Released: 12/11/2005 Document Revised: 03/04/2012 Document Reviewed: 05/10/2011 [facility] Patient Information 2014 [place], [place].  Urinary Tract Infection Urinary  tract infections (UTIs) can develop anywhere along your urinary tract. Your urinary tract is your body's drainage system for removing wastes and extra water. Your urinary tract includes  two kidneys, two ureters, a bladder, and a urethra. Your kidneys are a pair of bean-shaped organs. Each kidney is about the size of your fist. They are located below your ribs, one on each side of your spine. CAUSES Infections are caused by microbes, which are microscopic organisms, including fungi, viruses, and bacteria. These organisms are so small that they can only be seen through a microscope. Bacteria are the microbes that most commonly cause UTIs. SYMPTOMS  Symptoms of UTIs may vary by age and gender of the patient and by the location of the infection. Symptoms in young women typically include a frequent and intense urge to urinate and a painful, burning feeling in the bladder or urethra during urination. Older women and men are more likely to be tired, shaky, and weak and have muscle aches and abdominal pain. A fever may mean the infection is in your kidneys. Other symptoms of a kidney infection include pain in your back or sides below the ribs, nausea, and vomiting. DIAGNOSIS To diagnose a UTI, your caregiver will ask you about your symptoms. Your caregiver also will ask to provide a urine sample. The urine sample will be tested for bacteria and white blood cells. White blood cells are made by your body to help fight infection. TREATMENT  Typically, UTIs can be treated with medication. Because most UTIs are caused by a bacterial infection, they usually can be treated with the use of antibiotics. The choice of antibiotic and length of treatment depend on your symptoms and the type of bacteria causing your infection. HOME CARE INSTRUCTIONS  If you were prescribed antibiotics, take them exactly as your caregiver instructs you. Finish the medication even if you feel better after you have only taken some of the medication.  Drink enough water and fluids to keep your urine clear or pale yellow.  Avoid caffeine, tea, and carbonated beverages. They tend to irritate your bladder.  Empty your bladder  often. Avoid holding urine for long periods of time.  Empty your bladder before and after sexual intercourse.  After a bowel movement, women should cleanse from front to back. Use each tissue only once. SEEK MEDICAL CARE IF:   You have back pain.  You develop a fever.  Your symptoms do not begin to resolve within 3 days. SEEK IMMEDIATE MEDICAL CARE IF:   You have severe back pain or lower abdominal pain.  You develop chills.  You have nausea or vomiting.  You have continued burning or discomfort with urination. MAKE SURE YOU:   Understand these instructions.  Will watch your condition.  Will get help right away if you are not doing well or get worse. Document Released: 09/20/2005 Document Revised: 06/11/2012 Document Reviewed: 01/19/2012 [facility] Patient Information 2014 [place].

## 2014-02-26 NOTE — ED Notes (Signed)
md at bedside  Pt alert and oriented x4. Respirations even and unlabored, bilateral symmetrical rise and fall of chest. Skin warm and dry. In no acute distress. Denies needs.   

## 2014-02-26 NOTE — ED Notes (Signed)
Patient ready for discharge.  Patient's son states they live together and he was able to teach back discharge instructions.

## 2014-03-01 LAB — URINE CULTURE: Colony Count: >=100000

## 2014-03-06 NOTE — ED Provider Notes (Signed)
CSN: [id number]     Arrival date & time 02/26/14  [id number] History   First MD Initiated Contact with Patient 02/26/14 0900     Chief Complaint  Patient presents with  . Nausea  . Emesis     (Consider location/radiation/quality/duration/timing/severity/associated sxs/prior Treatment) HPI  [name] with n/v for the past 3-4 days. Denies any abdominal pain, just feels sick. Symptoms have been stable.  Fatigued. No fever or chills. No urinary complaints. No diarrhea. No sick contacts. Abdominal surgical hx significant for partial hysterectomy. NO intervention prior to arrival.   Past Medical History  Diagnosis Date  . Glaucoma   . Dementia   . Spondylosis   . Foot deformity   . Migraine   . CAD (coronary artery disease)   . Elevated BP   . Diverticulosis   . H/O: GI bleed   . Acute renal insufficiency   . GERD (gastroesophageal reflux disease)   . Barrett's esophagus   . Hyperglycemia   . HSV-2 (herpes simplex virus 2) infection   . Abnormal finding on Pap smear, ASCUS   . Osteoporosis     worsening  . Amputation finger     index  . Hypertension    Past Surgical History  Procedure Laterality Date  . Partial hysterectomy    . Amputation finger / thumb      secondary to osteomyelitis  . Foot surgery      right  . Upper gastrointestinal endoscopy    . Colonoscopy     Family History  Problem Relation Age of Onset  . Breast cancer Mother   . Heart attack Mother   . Heart disease Father   . Alcohol abuse Son   . Colon cancer Neg Hx   . Stomach cancer Neg Hx   . Rectal cancer Neg Hx    History  Substance Use Topics  . Smoking status: Never Smoker   . Smokeless tobacco: Never Used  . Alcohol Use: No   OB History   Grav Para Term Preterm Abortions TAB SAB Ect Mult Living                 Review of Systems  All systems reviewed and negative, other than as noted in HPI.   Allergies  Review of patient's allergies indicates no known allergies.  Home Medications    Current Outpatient Rx  Name  Route  Sig  Dispense  Refill  . aspirin 81 MG tablet   Oral   Take 81 mg by mouth daily.           . calcium carbonate (CALCIUM 500) 1250 MG tablet   Oral   Take 3 tablets by mouth daily.          . Cholecalciferol (VITAMIN D-3 PO)   Oral   Take 4,500 mg by mouth daily.           Marland Kitchen conjugated estrogens (PREMARIN) vaginal cream   Vaginal   Place 1 Applicatorful vaginally once a week.          . desonide (DESOWEN) 0.05 % cream   Topical   Apply topically 2 (two) times daily.           . ferrous sulfate 325 (65 FE) MG tablet   Oral   Take 325 mg by mouth daily with breakfast.           . gabapentin (NEURONTIN) 800 MG tablet   Oral   Take 1 tablet (800 mg total) by  mouth at bedtime. PATIENT NEEDS OFFICE VISIT FOR ADDITIONAL REFILLS   90 tablet   0   . hydrochlorothiazide (MICROZIDE) 12.5 MG capsule   Oral   Take 12.5 mg by mouth daily. Takes on Tuesday's and Saturday's         . HYDROcodone-acetaminophen (NORCO/VICODIN) 5-325 MG per tablet   Oral   Take 1 tablet by mouth every 4 (four) hours as needed for pain.   20 tablet   0   . hydroxypropyl methylcellulose (ISOPTO TEARS) 2.5 % ophthalmic solution   Both Eyes   Place 1 drop into both eyes 2 (two) times daily.         . hydrOXYzine (ATARAX/VISTARIL) 10 MG tablet   Oral   Take 10-30 mg by mouth every 4 (four) hours as needed for itching.          . lactose free nutrition (BOOST) LIQD   Oral   Take 237 mLs by mouth daily.         . metoprolol succinate (TOPROL-XL) 50 MG 24 hr tablet   Oral   Take 1 tablet (50 mg total) by mouth daily. Needs office visit (second notice)   30 tablet   11   . Multiple Vitamins-Minerals (EQL CENTRAL-VITE SELECT PO)   Oral   Take 1 tablet by mouth daily.          . Multiple Vitamins-Minerals (HAIR/SKIN/NAILS PO)   Oral   Take by mouth daily.           Marland Kitchen nystatin (MYCOSTATIN) cream   Topical   Apply 1 application  topically as needed.          Marland Kitchen omeprazole (PRILOSEC) 40 MG capsule   Oral   Take 1 capsule (40 mg total) by mouth daily.   30 capsule   11   . polyethylene glycol powder (MIRALAX) powder   Oral   Take 17 g by mouth as directed.   255 g   0     Take every other day   . promethazine (PHENERGAN) 25 MG tablet   Oral   Take 1 tablet (25 mg total) by mouth every 6 (six) hours as needed for nausea.   30 tablet   3   . traMADol-acetaminophen (ULTRACET) 37.5-325 MG per tablet   Oral   Take 1 tablet by mouth every 6 (six) hours as needed for moderate pain.          . verapamil (CALAN-SR) 240 MG CR tablet   Oral   Take 1 tablet (240 mg total) by mouth every morning.   30 tablet   11   . vitamin C (ASCORBIC ACID) 500 MG tablet   Oral   Take 500 mg by mouth daily.         . cephALEXin (KEFLEX) 500 MG capsule   Oral   Take 1 capsule (500 mg total) by mouth 4 (four) times daily.   12 capsule   0   . ondansetron (ZOFRAN) 4 MG tablet   Oral   Take 1 tablet (4 mg total) by mouth every 6 (six) hours.   12 tablet   0    BP 96/58  Pulse 80  Temp(Src) 98.1 F (36.7 C) (Temporal)  Resp 16  SpO2 96% Physical Exam  Nursing note and vitals reviewed. Constitutional: She appears well-developed and well-nourished. No distress.  HENT:  Head: Normocephalic and atraumatic.  Eyes: Conjunctivae are normal. Right eye exhibits no discharge. Left eye exhibits no  discharge.  Neck: Neck supple.  Cardiovascular: Normal rate, regular rhythm and normal heart sounds.  Exam reveals no gallop and no friction rub.   No murmur heard. Pulmonary/Chest: Effort normal and breath sounds normal. No respiratory distress.  Abdominal: Soft. She exhibits no distension. There is no tenderness. There is no rebound and no guarding.  No tenderness but c/o increased nausea with palpation  Musculoskeletal: She exhibits no edema and no tenderness.  Neurological: She is alert.  Skin: Skin is warm and dry.   Psychiatric: She has a normal mood and affect. Her behavior is normal. Thought content normal.    ED Course  Procedures (including critical care time) Labs Review Labs Reviewed  CBC WITH DIFFERENTIAL - Abnormal; Notable for the following:    Neutrophils Relative % 78 (*)    Lymphocytes Relative 11 (*)    All other components within normal limits  COMPREHENSIVE METABOLIC PANEL - Abnormal; Notable for the following:    Albumin 3.0 (*)    GFR calc non Af Amer 60 (*)    GFR calc Af Amer 69 (*)    All other components within normal limits  URINALYSIS, ROUTINE W REFLEX MICROSCOPIC - Abnormal; Notable for the following:    APPearance CLOUDY (*)    Hgb urine dipstick TRACE (*)    Nitrite POSITIVE (*)    Leukocytes, UA TRACE (*)    All other components within normal limits  URINE MICROSCOPIC-ADD ON - Abnormal; Notable for the following:    Bacteria, UA MANY (*)    All other components within normal limits  URINE CULTURE   Imaging Review No results found.  Ct Abdomen Pelvis W Contrast  02/26/2014   CLINICAL DATA:  Several day history of nausea and vomiting  EXAM: CT ABDOMEN AND PELVIS WITH CONTRAST  TECHNIQUE: Multidetector CT imaging of the abdomen and pelvis was performed using the standard protocol following bolus administration of intravenous contrast.  CONTRAST:  142mL OMNIPAQUE IOHEXOL 300 MG/ML SOLN intravenously; the patient also received oral contrast material.  COMPARISON:  CT scan of the abdomen and pelvis dated June 15, 2010  FINDINGS: The liver exhibits a stable right lobe hypodensity measuring 2.2 cm in greatest dimension consistent with a cyst. There is no significant intrahepatic ductal dilation. The gallbladder is mildly distended but exhibits no evidence of stones nor definite wall thickening. The pancreas, spleen, adrenal glands, and kidneys exhibit no acute abnormalities.  There is a large hiatal hernia-partially intrathoracic stomach. There are loops of mildly distended  contrast and gas-filled small bowel in the left mid and upper abdomen. Normal calibered, contrast-filled small bowel is seen distally. There is contrast within the colon to approximately the hepatic flexure. The appendix is not discretely demonstrated. There is no pericecal inflammatory change. There is scattered diverticulosis without evidence of acute diverticulitis. The urinary bladder is moderately distended. The uterus is atrophic or absent. There are calcifications in the cervical region. There is no free fluid in the pelvis. The psoas musculature is normal in appearance. The abdominal aorta exhibits a tortuous caliber, but there is no evidence of an aneurysm. There is mural calcification. There is no inguinal hernia. There is a small fat containing umbilical hernia.  The lumbar vertebral bodies are preserved in height. There is degenerative disc change at multiple lumbar levels. The bony pelvis exhibits no acute abnormality.  The lung bases exhibit minimal compressive atelectasis on the right posteriorly.  IMPRESSION: 1. There remains a large hiatal hernia-partially intrathoracic stomach. 2. The appearance  of the small and large bowel does not suggest obstruction. I cannot exclude gastroenteritis in the appropriate clinical setting. There is no evidence of acute diverticulitis. The appendix is not discretely demonstrated. 3. There is no evidence of acute urinary tract abnormality. The urinary bladder is moderately distended. 4. The gallbladder is mildly distended without evidence of stones. There are stable cystic changes in the liver. There are no findings to suggest acute pancreatitis.   Electronically Signed   By: David  Martinique   On: 02/26/2014 12:52   EKG Interpretation None      MDM   Final diagnoses:  UTI (urinary tract infection)  Nausea and vomiting   [name] with a few days history of n/v and some mild generalized weakness. Abddominal exam is fairly benign. Ct w/o evidence of obstruction. UA  consistent with UTI. Pt with no specific urinary complaints, but often times symptoms more vague in elderly patient's. I feel appropriate for outpt tx at this time. Return precautions discussed.     [name], MD 03/06/14 [phone]

## 2014-03-09 ENCOUNTER — Other Ambulatory Visit: Payer: Self-pay | Admitting: Emergency Medicine

## 2014-03-10 NOTE — Telephone Encounter (Signed)
Spoke to patient, advised her we need to see her for renewal of the antibiotic. Advised her need U/A and need to have a culture sent to the lab. She states she feels better today, if she does not improve soon she will come in for visit. She is provided Dr Caren Griffins hours.

## 2014-03-10 NOTE — Telephone Encounter (Signed)
I sent in RF of metoprolol. Pt stated she had an appt that she couldn't get to bc of snow. Now she has another appt sch for 03/23/14. Pt reports that she was very sick w/UTI which improved when taking Abx, but is now worsening again w/pain in her L flank. Pt states it is hard for her to arrange transportation and  Doesn't have any way to get to [place] now. Dr Everlene Farrier, do you want to RF the Keflex given at ED for another round, or another Abx, or do you need to see pt first.

## 2014-03-12 ENCOUNTER — Ambulatory Visit (INDEPENDENT_AMBULATORY_CARE_PROVIDER_SITE_OTHER): Payer: Medicare Other | Admitting: Emergency Medicine

## 2014-03-12 VITALS — BP 132/82 | HR 84 | Temp 98.4°F | Resp 17 | Ht <= 58 in | Wt 105.0 lb

## 2014-03-12 DIAGNOSIS — R8281 Pyuria: Secondary | ICD-10-CM

## 2014-03-12 DIAGNOSIS — R300 Dysuria: Secondary | ICD-10-CM | POA: Diagnosis not present

## 2014-03-12 DIAGNOSIS — R82998 Other abnormal findings in urine: Secondary | ICD-10-CM

## 2014-03-12 DIAGNOSIS — B999 Unspecified infectious disease: Secondary | ICD-10-CM

## 2014-03-12 DIAGNOSIS — Z202 Contact with and (suspected) exposure to infections with a predominantly sexual mode of transmission: Secondary | ICD-10-CM | POA: Diagnosis not present

## 2014-03-12 LAB — POCT URINALYSIS DIPSTICK
Bilirubin, UA: NEGATIVE
Blood, UA: NEGATIVE
Glucose, UA: NEGATIVE
Ketones, UA: NEGATIVE
Leukocytes, UA: NEGATIVE
Nitrite, UA: POSITIVE
PH UA: 7
PROTEIN UA: NEGATIVE
SPEC GRAV UA: 1.01
Urobilinogen, UA: 0.2

## 2014-03-12 LAB — POCT UA - MICROSCOPIC ONLY
CRYSTALS, UR, HPF, POC: NEGATIVE
Casts, Ur, LPF, POC: NEGATIVE
Mucus, UA: NEGATIVE
Yeast, UA: NEGATIVE

## 2014-03-12 LAB — RPR

## 2014-03-12 LAB — HIV ANTIBODY (ROUTINE TESTING W REFLEX): HIV: NONREACTIVE

## 2014-03-12 MED ORDER — CEPHALEXIN 500 MG PO CAPS: 500.0000 mg | ORAL_CAPSULE | Freq: Two times a day (BID) | ORAL | Status: DC

## 2014-03-12 NOTE — Progress Notes (Deleted)
   Subjective:    Patient ID: [name], female    DOB: 03/28/1933, 80 y.o.   MRN: [id number]  HPI    Review of Systems     Objective:   Physical Exam        Assessment & Plan:   

## 2014-03-12 NOTE — Progress Notes (Signed)
   Subjective:    Patient ID: [name], female    DOB: 1933/03/18, 80 y.o.   MRN: [id number]  HPI 80 year old female presents for dysuria. Pt was seen in ER on 02/26/14 for an UTI. Pt is also concerned about her feet. States they are red on the side. Pt needs approval for SCAT. She is having mild abdominal pain.    Review of Systems     Objective:   Physical Exam patient is alert and cooperative. She has a severe kyphoscoliosis. She walks bent over at approximately 90. Is no CVA tenderness the abdomen is soft there is some mild scaling of the lateral portion of both feet  Results for orders placed in visit on 03/12/14  POCT UA - MICROSCOPIC ONLY      Result Value Ref Range   WBC, Ur, HPF, POC 0-1     RBC, urine, microscopic 0-2     Bacteria, U Microscopic 4+     Mucus, UA neg     Epithelial cells, urine per micros 0-1     Crystals, Ur, HPF, POC neg     Casts, Ur, LPF, POC neg     Yeast, UA neg    POCT URINALYSIS DIPSTICK      Result Value Ref Range   Color, UA yellow     Clarity, UA cloudy     Glucose, UA neg     Bilirubin, UA neg     Ketones, UA neg     Spec Grav, UA 1.010     Blood, UA neg     pH, UA 7.0     Protein, UA neg     Urobilinogen, UA 0.2     Nitrite, UA pos     Leukocytes, UA Negative     Results for orders placed in visit on 03/12/14  POCT UA - MICROSCOPIC ONLY      Result Value Ref Range   WBC, Ur, HPF, POC 0-1     RBC, urine, microscopic 0-2     Bacteria, U Microscopic 4+     Mucus, UA neg     Epithelial cells, urine per micros 0-1     Crystals, Ur, HPF, POC neg     Casts, Ur, LPF, POC neg     Yeast, UA neg    POCT URINALYSIS DIPSTICK      Result Value Ref Range   Color, UA yellow     Clarity, UA cloudy     Glucose, UA neg     Bilirubin, UA neg     Ketones, UA neg     Spec Grav, UA 1.010     Blood, UA neg     pH, UA 7.0     Protein, UA neg     Urobilinogen, UA 0.2     Nitrite, UA pos     Leukocytes, UA Negative         Assessment &  Plan:  Urine looks better. She still 4+ bacteria with a positive nitrite. We'll go ahead and send a uriprobe  along with routine culture and resume her cephalexin 500 twice a day for 5 days. He has a history of syphilis. I did go ahead and do HIV RPR and URiprobe

## 2014-03-12 NOTE — Patient Instructions (Signed)
Urinary Tract Infection  Urinary tract infections (UTIs) can develop anywhere along your urinary tract. Your urinary tract is your body's drainage system for removing wastes and extra water. Your urinary tract includes two kidneys, two ureters, a bladder, and a urethra. Your kidneys are a pair of bean-shaped organs. Each kidney is about the size of your fist. They are located below your ribs, one on each side of your spine.  CAUSES  Infections are caused by microbes, which are microscopic organisms, including fungi, viruses, and bacteria. These organisms are so small that they can only be seen through a microscope. Bacteria are the microbes that most commonly cause UTIs.  SYMPTOMS   Symptoms of UTIs may vary by age and gender of the patient and by the location of the infection. Symptoms in young women typically include a frequent and intense urge to urinate and a painful, burning feeling in the bladder or urethra during urination. Older women and men are more likely to be tired, shaky, and weak and have muscle aches and abdominal pain. A fever may mean the infection is in your kidneys. Other symptoms of a kidney infection include pain in your back or sides below the ribs, nausea, and vomiting.  DIAGNOSIS  To diagnose a UTI, your caregiver will ask you about your symptoms. Your caregiver also will ask to provide a urine sample. The urine sample will be tested for bacteria and white blood cells. White blood cells are made by your body to help fight infection.  TREATMENT   Typically, UTIs can be treated with medication. Because most UTIs are caused by a bacterial infection, they usually can be treated with the use of antibiotics. The choice of antibiotic and length of treatment depend on your symptoms and the type of bacteria causing your infection.  HOME CARE INSTRUCTIONS   If you were prescribed antibiotics, take them exactly as your caregiver instructs you. Finish the medication even if you feel better after you  have only taken some of the medication.   Drink enough water and fluids to keep your urine clear or pale yellow.   Avoid caffeine, tea, and carbonated beverages. They tend to irritate your bladder.   Empty your bladder often. Avoid holding urine for long periods of time.   Empty your bladder before and after sexual intercourse.   After a bowel movement, women should cleanse from front to back. Use each tissue only once.  SEEK MEDICAL CARE IF:    You have back pain.   You develop a fever.   Your symptoms do not begin to resolve within 3 days.  SEEK IMMEDIATE MEDICAL CARE IF:    You have severe back pain or lower abdominal pain.   You develop chills.   You have nausea or vomiting.   You have continued burning or discomfort with urination.  MAKE SURE YOU:    Understand these instructions.   Will watch your condition.   Will get help right away if you are not doing well or get worse.  Document Released: 09/20/2005 Document Revised: 06/11/2012 Document Reviewed: 01/19/2012  [employer] Patient Information 2014 [employer]

## 2014-03-13 ENCOUNTER — Telehealth: Payer: Self-pay

## 2014-03-13 LAB — GC/CHLAMYDIA PROBE AMP
CT Probe RNA: NEGATIVE
GC Probe RNA: NEGATIVE

## 2014-03-13 NOTE — Telephone Encounter (Signed)
Pt left message that she would like a call back from the clinical staff.

## 2014-03-14 LAB — URINE CULTURE: Colony Count: >=100000

## 2014-03-20 NOTE — Telephone Encounter (Signed)
Spoke to pt, she had no concerns at this time.

## 2014-03-24 ENCOUNTER — Ambulatory Visit (INDEPENDENT_AMBULATORY_CARE_PROVIDER_SITE_OTHER): Payer: Medicare Other | Admitting: Emergency Medicine

## 2014-03-24 ENCOUNTER — Encounter: Payer: Self-pay | Admitting: Emergency Medicine

## 2014-03-24 VITALS — BP 160/70 | HR 94 | Temp 98.5°F | Resp 16 | Ht <= 58 in | Wt 104.0 lb

## 2014-03-24 DIAGNOSIS — M79609 Pain in unspecified limb: Secondary | ICD-10-CM | POA: Diagnosis not present

## 2014-03-24 DIAGNOSIS — R350 Frequency of micturition: Secondary | ICD-10-CM

## 2014-03-24 LAB — POCT UA - MICROSCOPIC ONLY
CASTS, UR, LPF, POC: NEGATIVE
CRYSTALS, UR, HPF, POC: NEGATIVE
MUCUS UA: NEGATIVE
YEAST UA: NEGATIVE

## 2014-03-24 LAB — POCT URINALYSIS DIPSTICK
Bilirubin, UA: NEGATIVE
Glucose, UA: NEGATIVE
KETONES UA: NEGATIVE
Leukocytes, UA: NEGATIVE
Nitrite, UA: NEGATIVE
PH UA: 6
PROTEIN UA: NEGATIVE
RBC UA: NEGATIVE
UROBILINOGEN UA: 0.2

## 2014-03-24 NOTE — Progress Notes (Addendum)
  This chart was scribed for [name] Russian, MD by Eston Mould, ED Scribe. This patient was seen in room Room/bed 22 and the patient's care was started at 1:19 PM. Subjective:    Patient ID: [name], female    DOB: 1933/11/28, 80 y.o.   MRN: [id number] Chief Complaint  Patient presents with  . Foot Pain    RIGHT and per patient taking all medications  . Urinary Frequency   HPI [name] is a 80 y.o. female who presents to the [facility] for ongoing R foot pain and urinary frequency that began yesterday. Pt states she is not sexually active and states she was last sexually active 2 years ago. Pt denies hematuria and burning when urinating. Pt states she is feeling well and reports to be working in the yard lately.  Pt c/o calluses and dryness to lateral aspect of R foot. Pt states her BP can be elevated at times but states she is taking her medication as prescribed.   Review of Systems  All other systems reviewed and are negative.   Objective:   Physical Exam CONSTITUTIONAL: Well developed/well nourished HEAD: Normocephalic/atraumatic EYES: EOMI/PERRL ENMT: Mucous membranes moist NECK: supple no meningeal signs SPINE:entire spine nontender CV: S1/S2 noted, no murmurs/rubs/gallops noted LUNGS: Lungs are clear to auscultation bilaterally, no apparent distress ABDOMEN: soft, nontender, no rebound or guarding GU:no cva tenderness NEURO: Pt is awake/alert, moves all extremitiesx4 EXTREMITIES: pulses normal, full ROM. Terrible kyphoscoliotic spine. 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  SKIN: warm, color normal.1 x 1 cm calluse over the distal R metatarsal. PSYCH: no abnormalities of mood noted  Repeat BP by Dr. Everlene Farrier: 128/70  Results for orders placed in visit on 03/24/14  POCT URINALYSIS DIPSTICK      Result Value Ref Range   Color, UA yellow     Clarity, UA clear     Glucose, UA neg     Bilirubin, UA neg     Ketones, UA neg     Spec Grav, UA <=1.005     Blood, UA  neg     pH, UA 6.0     Protein, UA neg     Urobilinogen, UA 0.2     Nitrite, UA neg     Leukocytes, UA Negative    POCT UA - MICROSCOPIC ONLY      Result Value Ref Range   WBC, Ur, HPF, POC 2-6     RBC, urine, microscopic 0-1     Bacteria, U Microscopic 2+     Mucus, UA neg     Epithelial cells, urine per micros 1-3     Crystals, Ur, HPF, POC neg     Casts, Ur, LPF, POC neg     Yeast, UA neg      Triage Vitals:BP 160/70  Pulse 94  Temp(Src) 98.5 F (36.9 C) (Oral)  Resp 16  Ht 4\' 9"  (1.448 m)  Wt 104 lb (47.174 kg)  BMI 22.50 kg/m2  SpO2 96% Assessment & Plan:   Urine culture was done. Repeat blood pressure was within range. No change in medication. I will call her when I get her urine culture back   I personally performed the services described in this documentation, which was scribed in my presence. The recorded information has been reviewed and is accurate.

## 2014-03-27 LAB — URINE CULTURE: Colony Count: >=100000

## 2014-03-28 ENCOUNTER — Telehealth: Payer: Self-pay

## 2014-03-28 DIAGNOSIS — N390 Urinary tract infection, site not specified: Secondary | ICD-10-CM

## 2014-03-28 MED ORDER — CEPHALEXIN 500 MG PO CAPS: 500.0000 mg | ORAL_CAPSULE | Freq: Two times a day (BID) | ORAL | Status: DC

## 2014-03-28 NOTE — Telephone Encounter (Signed)
Please call patient. A referral is being made because patient has recurrent urinary tract infections. Please be sure ambulatory referral to urology is in the system

## 2014-03-28 NOTE — Telephone Encounter (Signed)
Please advise. Thanks.  

## 2014-03-28 NOTE — Telephone Encounter (Signed)
Pt is needing to talk with dr Everlene Farrier about a urology referral   Best number [phone]

## 2014-03-28 NOTE — Addendum Note (Signed)
Addended byGrant Fontana R on: 03/28/2014 09:43 AM   Modules accepted: Orders

## 2014-03-29 NOTE — Telephone Encounter (Signed)
Pt notified and referral placed.  

## 2014-04-06 ENCOUNTER — Other Ambulatory Visit: Payer: Self-pay | Admitting: Emergency Medicine

## 2014-04-06 DIAGNOSIS — M545 Low back pain, unspecified: Secondary | ICD-10-CM | POA: Diagnosis not present

## 2014-04-06 DIAGNOSIS — G894 Chronic pain syndrome: Secondary | ICD-10-CM | POA: Diagnosis not present

## 2014-04-06 DIAGNOSIS — M5030 Other cervical disc degeneration, unspecified cervical region: Secondary | ICD-10-CM | POA: Diagnosis not present

## 2014-04-08 ENCOUNTER — Telehealth: Payer: Self-pay

## 2014-04-08 NOTE — Telephone Encounter (Signed)
[name] called stated she changed her date to the Urology to May 6 at 1:15 instead of April 27. She also said she is losing a lot of weight but she feels good. Contact number [phone]

## 2014-04-08 NOTE — Telephone Encounter (Signed)
FYI  Appt with Urology is going to be May 6.

## 2014-04-15 ENCOUNTER — Telehealth: Payer: Self-pay

## 2014-04-15 NOTE — Telephone Encounter (Signed)
Her BP has been running lower. She has been feeling great. She has been doing yard work. She is not having much of an appetite. She does not have many food choices. She is going to have her aid call us tomorrow with the blood pressure readings.

## 2014-04-15 NOTE — Telephone Encounter (Signed)
PT STATES HER PRESSURE HAVE BEEN LOW AND SHE DOESN'T UNDERSTAND WHY. PLEASE CALL [phone] DOESN'T HAVE AN APPETITE

## 2014-04-17 ENCOUNTER — Other Ambulatory Visit: Payer: Self-pay | Admitting: Emergency Medicine

## 2014-04-23 ENCOUNTER — Ambulatory Visit (INDEPENDENT_AMBULATORY_CARE_PROVIDER_SITE_OTHER): Payer: Medicare Other | Admitting: Emergency Medicine

## 2014-04-23 ENCOUNTER — Encounter: Payer: Self-pay | Admitting: Emergency Medicine

## 2014-04-23 VITALS — BP 136/72 | HR 84 | Temp 98.0°F | Resp 18 | Ht <= 58 in | Wt 109.4 lb

## 2014-04-23 DIAGNOSIS — M79609 Pain in unspecified limb: Secondary | ICD-10-CM | POA: Diagnosis not present

## 2014-04-23 DIAGNOSIS — Z202 Contact with and (suspected) exposure to infections with a predominantly sexual mode of transmission: Secondary | ICD-10-CM

## 2014-04-23 DIAGNOSIS — M79605 Pain in left leg: Secondary | ICD-10-CM

## 2014-04-23 LAB — POCT CBC
GRANULOCYTE PERCENT: 67.5 % (ref 37–80)
HEMATOCRIT: 38.8 % (ref 37.7–47.9)
HEMOGLOBIN: 12.1 g/dL — AB (ref 12.2–16.2)
Lymph, poc: 1.4 (ref 0.6–3.4)
MCH, POC: 28.5 pg (ref 27–31.2)
MCHC: 31.2 g/dL — AB (ref 31.8–35.4)
MCV: 91.5 fL (ref 80–97)
MID (cbc): 0.4 (ref 0–0.9)
MPV: 8.7 fL (ref 0–99.8)
PLATELET COUNT, POC: 408 10*3/uL (ref 142–424)
POC Granulocyte: 3.7 (ref 2–6.9)
POC LYMPH PERCENT: 25 %L (ref 10–50)
POC MID %: 7.5 %M (ref 0–12)
RBC: 4.24 M/uL (ref 4.04–5.48)
RDW, POC: 14 %
WBC: 5.5 10*3/uL (ref 4.6–10.2)

## 2014-04-23 MED ORDER — CEPHALEXIN 500 MG PO CAPS
500.0000 mg | ORAL_CAPSULE | Freq: Two times a day (BID) | ORAL | Status: DC
Start: 2014-04-23 — End: 2014-04-23

## 2014-04-23 MED ORDER — CEPHALEXIN 500 MG PO CAPS: 500.0000 mg | ORAL_CAPSULE | Freq: Two times a day (BID) | ORAL | Status: DC

## 2014-04-23 NOTE — Patient Instructions (Signed)
Cellulitis Cellulitis is an infection of the skin and the tissue beneath it. The infected area is usually red and tender. Cellulitis occurs most often in the arms and lower legs.  CAUSES  Cellulitis is caused by bacteria that enter the skin through cracks or cuts in the skin. The most common types of bacteria that cause cellulitis are Staphylococcus and Streptococcus. SYMPTOMS   Redness and warmth.  Swelling.  Tenderness or pain.  Fever. DIAGNOSIS  Your caregiver can usually determine what is wrong based on a physical exam. Blood tests may also be done. TREATMENT  Treatment usually involves taking an antibiotic medicine. HOME CARE INSTRUCTIONS   Take your antibiotics as directed. Finish them even if you start to feel better.  Keep the infected arm or leg elevated to reduce swelling.  Apply a warm cloth to the affected area up to 4 times per day to relieve pain.  Only take over-the-counter or prescription medicines for pain, discomfort, or fever as directed by your caregiver.  Keep all follow-up appointments as directed by your caregiver. SEEK MEDICAL CARE IF:   You notice red streaks coming from the infected area.  Your red area gets larger or turns dark in color.  Your bone or joint underneath the infected area becomes painful after the skin has healed.  Your infection returns in the same area or another area.  You notice a swollen bump in the infected area.  You develop new symptoms. SEEK IMMEDIATE MEDICAL CARE IF:   You have a fever.  You feel very sleepy.  You develop vomiting or diarrhea.  You have a general ill feeling (malaise) with muscle aches and pains. MAKE SURE YOU:   Understand these instructions.  Will watch your condition.  Will get help right away if you are not doing well or get worse. Document Released: 09/20/2005 Document Revised: 06/11/2012 Document Reviewed: 02/26/2012 [employer] Patient Information 2014 [employer].  

## 2014-04-23 NOTE — Progress Notes (Signed)
  This chart was scribed for Remo Lipps A. Fradel Baldonado MD, by Allena Earing, ED Scribe. This patient was seen in room 11 and the patient's care was started at 11:22 AM .  Subjective:    Patient ID: [name], female    DOB: 03-Aug-1933, 80 y.o.   MRN: [id number]  HPI   HPI Comments: [name] is a 80 y.o. female who presents to the [facility] complaining of an are of irritation on her lower interior portion left leg, she believes that it could possibly be a insect bite. She states that the pain has been present for around 3 days and has been somewhat painful. She also states that the site is painful to touch. She states that she feels great otherwise.  She had been taking Cephalexin 500mg  2x a day earlier in the month.    Review of Systems  Skin:       Interior portion of her lower left leg       Objective:   Physical Exam  Nursing note and vitals reviewed.    CONSTITUTIONAL: Well developed/well nourished HEAD: Normocephalic/atraumatic EYES: EOMI/PERRL ENMT: Mucous membranes moist NECK: supple no meningeal signs SPINE:entire spine nontender CV: S1/S2 noted, no murmurs/rubs/gallops noted LUNGS: Lungs are clear to auscultation bilaterally, no apparent distress ABDOMEN: soft, nontender, no rebound or guarding GU:no cva tenderness there are 3 what appear to be resolving herpetic lesions on the inside of the left labia NEURO: Pt is awake/alert, moves all extremitiesx4 EXTREMITIES there is an approximately 3 x 6" area of redness along the medial left lower leg above the medial malleolus. This area is tender to touch and slightly red but not warm . SKIN: warm, color normal PSYCH: no abnormalities of mood noted    Filed Vitals:   04/23/14 1111  BP: 136/72  Pulse: 84  Temp: 98 F (36.7 C)  Resp: 18      Results for orders placed in visit on 04/23/14  POCT CBC      Result Value Ref Range   WBC 5.5  4.6 - 10.2 K/uL   Lymph, poc 1.4  0.6 - 3.4   POC LYMPH  PERCENT 25.0  10 - 50 %L   MID (cbc) 0.4  0 - 0.9   POC MID % 7.5  0 - 12 %M   POC Granulocyte 3.7  2 - 6.9   Granulocyte percent 67.5  37 - 80 %G   RBC 4.24  4.04 - 5.48 M/uL   Hemoglobin 12.1 (*) 12.2 - 16.2 g/dL   HCT, POC 38.8  37.7 - 47.9 %   MCV 91.5  80 - 97 fL   MCH, POC 28.5  27 - 31.2 pg   MCHC 31.2 (*) 31.8 - 35.4 g/dL   RDW, POC 14.0     Platelet Count, POC 408  142 - 424 K/uL   MPV 8.7  0 - 99.8 fL        Assessment & Plan:  Patient appears to have a resolving herpetic infection of the left labia minora. I suspect she has a cellulitis of the left leg. She has a history of cellulitis involving her upper extremity which required finger amputation. Started on cephalexin and recheck in approximately 72 hour I personally performed the services described in this documentation, which was scribed in my presence. The recorded information has been reviewed and is accurate.

## 2014-04-24 LAB — RPR

## 2014-04-24 LAB — HIV ANTIBODY (ROUTINE TESTING W REFLEX): HIV 1&2 Ab, 4th Generation: NONREACTIVE

## 2014-04-27 ENCOUNTER — Ambulatory Visit (INDEPENDENT_AMBULATORY_CARE_PROVIDER_SITE_OTHER): Payer: Medicare Other | Admitting: Emergency Medicine

## 2014-04-27 ENCOUNTER — Telehealth: Payer: Self-pay

## 2014-04-27 VITALS — BP 162/60 | HR 83 | Temp 98.1°F | Resp 16 | Ht <= 58 in | Wt 111.2 lb

## 2014-04-27 DIAGNOSIS — L02419 Cutaneous abscess of limb, unspecified: Secondary | ICD-10-CM | POA: Diagnosis not present

## 2014-04-27 DIAGNOSIS — L03119 Cellulitis of unspecified part of limb: Secondary | ICD-10-CM | POA: Diagnosis not present

## 2014-04-27 DIAGNOSIS — L03116 Cellulitis of left lower limb: Secondary | ICD-10-CM

## 2014-04-27 NOTE — Telephone Encounter (Signed)
Call patient let her known her CBC is unchanged and not anything to worry about

## 2014-04-27 NOTE — Telephone Encounter (Signed)
Pt calling about lab work from 4/30. I let her know the HIV and RPR were neg. Please comment on the CBC

## 2014-04-27 NOTE — Progress Notes (Signed)
   Subjective:    Patient ID: [name], female    DOB: 1933/07/31, 80 y.o.   MRN: [id number]  HPI 80 y.o. Female presents to clinic for follow up for cellulitis of left lower leg. Reports feeling a lot better. Decreased pain decreased swelling. The area still slightly red but much less tender   Review of Systems     Objective:   Physical Exam there is redness and tenderness medial left lower leg. This extends about a third of the way up the lower leg        Assessment & Plan:  Patient finish her Keflex. When she is not working she is to keep her leg elevated

## 2014-04-27 NOTE — Telephone Encounter (Signed)
Spoke to patient and advised her that her CBC had not changed and she did not have anything to worry about.  She was very kind.

## 2014-04-28 ENCOUNTER — Emergency Department (HOSPITAL_COMMUNITY)
Admission: EM | Admit: 2014-04-28 | Discharge: 2014-04-28 | Disposition: A | Discharge status: Home / Self Care | Payer: Medicare Other | Attending: Emergency Medicine | Admitting: Emergency Medicine

## 2014-04-28 ENCOUNTER — Encounter (HOSPITAL_COMMUNITY): Payer: Self-pay | Admitting: Emergency Medicine

## 2014-04-28 DIAGNOSIS — Z7982 Long term (current) use of aspirin: Secondary | ICD-10-CM | POA: Diagnosis not present

## 2014-04-28 DIAGNOSIS — H409 Unspecified glaucoma: Secondary | ICD-10-CM | POA: Diagnosis not present

## 2014-04-28 DIAGNOSIS — M810 Age-related osteoporosis without current pathological fracture: Secondary | ICD-10-CM | POA: Insufficient documentation

## 2014-04-28 DIAGNOSIS — G43909 Migraine, unspecified, not intractable, without status migrainosus: Secondary | ICD-10-CM | POA: Insufficient documentation

## 2014-04-28 DIAGNOSIS — Z79899 Other long term (current) drug therapy: Secondary | ICD-10-CM | POA: Diagnosis not present

## 2014-04-28 DIAGNOSIS — R296 Repeated falls: Secondary | ICD-10-CM | POA: Insufficient documentation

## 2014-04-28 DIAGNOSIS — I2510 Atherosclerotic heart disease of native coronary artery without angina pectoris: Secondary | ICD-10-CM | POA: Diagnosis not present

## 2014-04-28 DIAGNOSIS — IMO0002 Reserved for concepts with insufficient information to code with codable children: Secondary | ICD-10-CM | POA: Insufficient documentation

## 2014-04-28 DIAGNOSIS — S81819A Laceration without foreign body, unspecified lower leg, initial encounter: Secondary | ICD-10-CM

## 2014-04-28 DIAGNOSIS — I10 Essential (primary) hypertension: Secondary | ICD-10-CM | POA: Insufficient documentation

## 2014-04-28 DIAGNOSIS — Z23 Encounter for immunization: Secondary | ICD-10-CM | POA: Diagnosis not present

## 2014-04-28 DIAGNOSIS — Z87448 Personal history of other diseases of urinary system: Secondary | ICD-10-CM | POA: Diagnosis not present

## 2014-04-28 DIAGNOSIS — F0390 Unspecified dementia without behavioral disturbance: Secondary | ICD-10-CM | POA: Diagnosis not present

## 2014-04-28 DIAGNOSIS — S71109A Unspecified open wound, unspecified thigh, initial encounter: Principal | ICD-10-CM | POA: Insufficient documentation

## 2014-04-28 DIAGNOSIS — K219 Gastro-esophageal reflux disease without esophagitis: Secondary | ICD-10-CM | POA: Insufficient documentation

## 2014-04-28 DIAGNOSIS — S71009A Unspecified open wound, unspecified hip, initial encounter: Secondary | ICD-10-CM | POA: Insufficient documentation

## 2014-04-28 DIAGNOSIS — Y9301 Activity, walking, marching and hiking: Secondary | ICD-10-CM | POA: Insufficient documentation

## 2014-04-28 DIAGNOSIS — Y929 Unspecified place or not applicable: Secondary | ICD-10-CM | POA: Insufficient documentation

## 2014-04-28 DIAGNOSIS — S81809A Unspecified open wound, unspecified lower leg, initial encounter: Secondary | ICD-10-CM | POA: Diagnosis not present

## 2014-04-28 DIAGNOSIS — S41009A Unspecified open wound of unspecified shoulder, initial encounter: Secondary | ICD-10-CM | POA: Diagnosis not present

## 2014-04-28 DIAGNOSIS — Z8619 Personal history of other infectious and parasitic diseases: Secondary | ICD-10-CM | POA: Insufficient documentation

## 2014-04-28 MED ORDER — TETANUS-DIPHTH-ACELL PERTUSSIS 5-2.5-18.5 LF-MCG/0.5 IM SUSP
0.5000 mL | Freq: Once | INTRAMUSCULAR | Status: AC
  Administered 2014-04-28: 0.5 mL via INTRAMUSCULAR
  Filled 2014-04-28: qty 0.5

## 2014-04-28 NOTE — Discharge Instructions (Signed)
Apply bacitracin twice a day to prevent infection. Keep the area covered to prevent infection and to promote healing. Recommend you followup in one week for suture removal. Return sooner if signs of infection develop.  Laceration Care, Adult A laceration is a cut or lesion that goes through all layers of the skin and into the tissue just beneath the skin. TREATMENT  Some lacerations may not require closure. Some lacerations may not be able to be closed due to an increased risk of infection. It is important to see your caregiver as soon as possible after an injury to minimize the risk of infection and maximize the opportunity for successful closure. If closure is appropriate, pain medicines may be given, if needed. The wound will be cleaned to help prevent infection. Your caregiver will use stitches (sutures), staples, wound glue (adhesive), or skin adhesive strips to repair the laceration. These tools bring the skin edges together to allow for faster healing and a better cosmetic outcome. However, all wounds will heal with a scar. Once the wound has healed, scarring can be minimized by covering the wound with sunscreen during the day for 1 full year. HOME CARE INSTRUCTIONS  For sutures or staples:  Keep the wound clean and dry.  If you were given a bandage (dressing), you should change it at least once a day. Also, change the dressing if it becomes wet or dirty, or as directed by your caregiver.  Wash the wound with soap and water 2 times a day. Rinse the wound off with water to remove all soap. Pat the wound dry with a clean towel.  After cleaning, apply a thin layer of the antibiotic ointment as recommended by your caregiver. This will help prevent infection and keep the dressing from sticking.  You may shower as usual after the first 24 hours. Do not soak the wound in water until the sutures are removed.  Only take over-the-counter or prescription medicines for pain, discomfort, or fever as  directed by your caregiver.  Get your sutures or staples removed as directed by your caregiver. For skin adhesive strips:  Keep the wound clean and dry.  Do not get the skin adhesive strips wet. You may bathe carefully, using caution to keep the wound dry.  If the wound gets wet, pat it dry with a clean towel.  Skin adhesive strips will fall off on their own. You may trim the strips as the wound heals. Do not remove skin adhesive strips that are still stuck to the wound. They will fall off in time. For wound adhesive:  You may briefly wet your wound in the shower or bath. Do not soak or scrub the wound. Do not swim. Avoid periods of heavy perspiration until the skin adhesive has fallen off on its own. After showering or bathing, gently pat the wound dry with a clean towel.  Do not apply liquid medicine, cream medicine, or ointment medicine to your wound while the skin adhesive is in place. This may loosen the film before your wound is healed.  If a dressing is placed over the wound, be careful not to apply tape directly over the skin adhesive. This may cause the adhesive to be pulled off before the wound is healed.  Avoid prolonged exposure to sunlight or tanning lamps while the skin adhesive is in place. Exposure to ultraviolet light in the first year will darken the scar.  The skin adhesive will usually remain in place for 5 to 10 days, then naturally  fall off the skin. Do not pick at the adhesive film. You may need a tetanus shot if:  You cannot remember when you had your last tetanus shot.  You have never had a tetanus shot. If you get a tetanus shot, your arm may swell, get red, and feel warm to the touch. This is common and not a problem. If you need a tetanus shot and you choose not to have one, there is a rare chance of getting tetanus. Sickness from tetanus can be serious. SEEK MEDICAL CARE IF:   You have redness, swelling, or increasing pain in the wound.  You see a red  line that goes away from the wound.  You have yellowish-white fluid (pus) coming from the wound.  You have a fever.  You notice a bad smell coming from the wound or dressing.  Your wound breaks open before or after sutures have been removed.  You notice something coming out of the wound such as wood or glass.  Your wound is on your hand or foot and you cannot move a finger or toe. SEEK IMMEDIATE MEDICAL CARE IF:   Your pain is not controlled with prescribed medicine.  You have severe swelling around the wound causing pain and numbness or a change in color in your arm, hand, leg, or foot.  Your wound splits open and starts bleeding.  You have worsening numbness, weakness, or loss of function of any joint around or beyond the wound.  You develop painful lumps near the wound or on the skin anywhere on your body. MAKE SURE YOU:   Understand these instructions.  Will watch your condition.  Will get help right away if you are not doing well or get worse. Document Released: 12/11/2005 Document Revised: 03/04/2012 Document Reviewed: 06/06/2011 [facility] Patient Information 2014 [place], [place].

## 2014-04-28 NOTE — ED Notes (Signed)
Bed: NT61 Expected date:  Expected time:  Means of arrival:  Comments: Another old person, fall

## 2014-04-28 NOTE — ED Provider Notes (Signed)
Medical screening examination/treatment/procedure(s) were conducted as a shared visit with non-physician practitioner(s) and myself.  I personally evaluated the patient during the encounter.   EKG Interpretation None      Pt is a 80 y.o. F with history of dementia who presents emergency department with a small laceration to her proximal left lateral thigh. She states that she cut it walking in her house and cut it on a door. No other injury. She is neurovascularly intact distally. Laceration has been repaired by Antonietta Breach.    [place], DO 04/28/14 1546

## 2014-04-28 NOTE — ED Provider Notes (Signed)
CSN: [id number]     Arrival date & time 04/28/14  1027 History   First MD Initiated Contact with Patient 04/28/14 1050     Chief Complaint  Patient presents with  . Fall  . Extremity Laceration    (Consider location/radiation/quality/duration/timing/severity/associated sxs/prior Treatment) HPI Comments: 80 year old female presents for a laceration to her proximal left thigh. Patient states that she was walking in the kitchen when the door opened and the corner of the door gouged her left thigh. Patient states that she fell as the result of this, though she denies hitting her head or loss of consciousness. Patient denies abdominal pain, headache, back pain, neck pain, incontinence, numbness/tingling, and extremity weakness. Patient denies the use of blood thinners. She has not tried anything for symptoms since onset at 0430.  Patient is a 80 y.o. female presenting with fall. The history is provided by the patient. No language interpreter was used.  Fall This is a new problem. The current episode started today (0430). The problem has been unchanged. Pertinent negatives include no abdominal pain, change in bowel habit, fever, numbness, vomiting or weakness. Nothing aggravates the symptoms. She has tried nothing for the symptoms. The treatment provided no relief.    Past Medical History  Diagnosis Date  . Glaucoma   . Dementia   . Spondylosis   . Foot deformity   . Migraine   . CAD (coronary artery disease)   . Elevated BP   . Diverticulosis   . H/O: GI bleed   . Acute renal insufficiency   . GERD (gastroesophageal reflux disease)   . Barrett's esophagus   . Hyperglycemia   . HSV-2 (herpes simplex virus 2) infection   . Abnormal finding on Pap smear, ASCUS   . Osteoporosis     worsening  . Amputation finger     index  . Hypertension    Past Surgical History  Procedure Laterality Date  . Partial hysterectomy    . Amputation finger / thumb      secondary to osteomyelitis  . Foot  surgery      right  . Upper gastrointestinal endoscopy    . Colonoscopy     Family History  Problem Relation Age of Onset  . Breast cancer Mother   . Heart attack Mother   . Heart disease Father   . Alcohol abuse Son   . Colon cancer Neg Hx   . Stomach cancer Neg Hx   . Rectal cancer Neg Hx    History  Substance Use Topics  . Smoking status: Never Smoker   . Smokeless tobacco: Never Used  . Alcohol Use: No   OB History   Grav Para Term Preterm Abortions TAB SAB Ect Mult Living                  Review of Systems  Constitutional: Negative for fever.  Gastrointestinal: Negative for vomiting, abdominal pain and change in bowel habit.  Genitourinary:       No incontinence  Musculoskeletal: Negative for back pain.  Skin: Positive for wound.  Neurological: Negative for syncope, weakness and numbness.  All other systems reviewed and are negative.    Allergies  Review of patient's allergies indicates no known allergies.  Home Medications   Prior to Admission medications   Medication Sig Start Date End Date Taking? Authorizing Provider  aspirin 81 MG tablet Take 81 mg by mouth daily.     Yes Historical Provider, MD  calcium carbonate (CALCIUM 500) 1250  MG tablet Take 3 tablets by mouth daily.    Yes Historical Provider, MD  Cholecalciferol (VITAMIN D-3 PO) Take 4,500 mg by mouth daily.     Yes Historical Provider, MD  conjugated estrogens (PREMARIN) vaginal cream Place 1 Applicatorful vaginally once a week.    Yes Historical Provider, MD  desonide (DESOWEN) 0.05 % cream Apply topically 2 (two) times daily.     Yes Historical Provider, MD  ferrous sulfate 325 (65 FE) MG tablet Take 325 mg by mouth daily with breakfast.     Yes Historical Provider, MD  gabapentin (NEURONTIN) 800 MG tablet Take 1 tablet (800 mg total) by mouth at bedtime. PATIENT NEEDS OFFICE VISIT FOR ADDITIONAL REFILLS   Yes [name], MD  hydrochlorothiazide (MICROZIDE) 12.5 MG capsule take 1 tablet by  mouth ON TUESDAY AND SATURDAY EACH WEEK   Yes [name], PA-C  hydroxypropyl methylcellulose (ISOPTO TEARS) 2.5 % ophthalmic solution Place 1 drop into both eyes 2 (two) times daily.   Yes Historical Provider, MD  hydrOXYzine (ATARAX/VISTARIL) 10 MG tablet Take 10-30 mg by mouth every 4 (four) hours as needed for itching.    Yes Historical Provider, MD  lactose free nutrition (BOOST) LIQD Take 237 mLs by mouth daily.   Yes Historical Provider, MD  metoprolol succinate (TOPROL-XL) 50 MG 24 hr tablet take 1 tablet by mouth once daily 04/06/14  Yes [name], MD  Multiple Vitamins-Minerals (EQL CENTRAL-VITE SELECT PO) Take 1 tablet by mouth daily.    Yes Historical Provider, MD  Multiple Vitamins-Minerals (HAIR/SKIN/NAILS PO) Take by mouth daily.     Yes Historical Provider, MD  nystatin (MYCOSTATIN) cream Apply 1 application topically as needed (dry skin).    Yes Historical Provider, MD  omeprazole (PRILOSEC) 40 MG capsule Take 1 capsule (40 mg total) by mouth daily. 10/20/13  Yes [name], MD  ondansetron (ZOFRAN) 4 MG tablet Take 1 tablet (4 mg total) by mouth every 6 (six) hours. 02/26/14  Yes [name], MD  traMADol-acetaminophen (ULTRACET) 37.5-325 MG per tablet Take 1 tablet by mouth every 6 (six) hours as needed for moderate pain.  04/24/13  Yes Historical Provider, MD  verapamil (CALAN-SR) 240 MG CR tablet take 1 tablet by mouth every morning 04/06/14  Yes [name], MD  vitamin C (ASCORBIC ACID) 500 MG tablet Take 500 mg by mouth daily.   Yes Historical Provider, MD   BP 118/63  Pulse 84  Temp(Src) 98.2 F (36.8 C) (Oral)  Resp 16  SpO2 98%  Physical Exam  Nursing note and vitals reviewed. Constitutional: She is oriented to person, place, and time. She appears well-developed and well-nourished. No distress.  HENT:  Head: Normocephalic and atraumatic.  No trauma appreciated to head/face/scalp  Eyes: Conjunctivae and EOM are normal. No scleral icterus.  Neck:  Normal range of motion.  Cardiovascular: Normal rate, regular rhythm and intact distal pulses.   Distal radial pulse 2+ in LUE.  Pulmonary/Chest: Effort normal. No respiratory distress.  Abdominal: Soft. She exhibits no distension. There is no tenderness.  Musculoskeletal: Normal range of motion.  Np left hip tenderness or decreased ROM.  Neurological: She is alert and oriented to person, place, and time.  GCS 15. Speech goal oriented. No gross sensory deficits noted. Patient moves extremities without ataxia.  Skin: Skin is warm and dry. No rash noted. She is not diaphoretic. No erythema. No pallor.  1cm laceration to lateral proximal L thigh. No active bleeding noted. No  evidence of acute trauma to rest of body including chest, abdomen, back, and limbs.  Psychiatric: She has a normal mood and affect. Her behavior is normal.    ED Course  Procedures (including critical care time) Labs Review Labs Reviewed - No data to display  Imaging Review No results found.   EKG Interpretation None      LACERATION REPAIR Performed by: Antonietta Breach Authorized by: Antonietta Breach Consent: Verbal consent obtained. Risks and benefits: risks, benefits and alternatives were discussed Consent given by: patient Patient identity confirmed: provided demographic data Prepped and Draped in normal sterile fashion Wound explored  Laceration Location: L proximal thigh  Laceration Length: 1cm  No Foreign Bodies seen or palpated  Anesthesia: local infiltration  Local anesthetic: lidocaine 1% without epinephrine  Anesthetic total: 2 ml  Irrigation method: syringe Amount of cleaning: standard  Skin closure: 4-0 ethilon  Number of sutures: 2  Technique: simple interrupted  Patient tolerance: Patient tolerated the procedure well with no immediate complications.   MDM   Final diagnoses:  Laceration of lower extremity    80 year old female presents from home, where she lives with her son,  for a laceration to her left thigh from walking into a cabinet. No evidence of acute trauma elsewhere on the body. Patient with no complaints of neck pain, back pain, incontinence, numbness/tingling, or weakness. She denies head trauma or loss of consciousness. Patient not on blood thinners. Tdap booster given today. Pressure irrigation performed. Laceration occurred < 8 hours prior to repair which was well tolerated. Discussed suture home care with patient. She is to f/u for wound check and suture removal in 7 days. Pt is hemodynamically stable w no complaints prior to dc. Patient agreeable to plan with no unaddressed concerns.   Filed Vitals:   04/28/14 1034  BP: 118/63  Pulse: 84  Temp: 98.2 F (36.8 C)  TempSrc: Oral  Resp: 16  SpO2: 98%       Antonietta Breach, PA-C 04/28/14 1247

## 2014-04-28 NOTE — ED Notes (Signed)
Per EMS. Pt from home. Was walking in kitchen when she fell into cabinet and gouged L thigh on knob. EMS reports a 1/2" laceration to Lthigh, no active bleeding on EMS arrival. No blood thinners/ LOC or neck/back pain. Pt ambulatory to room with EMS.

## 2014-04-28 NOTE — ED Notes (Signed)
MD at bedside. 

## 2014-04-29 DIAGNOSIS — N952 Postmenopausal atrophic vaginitis: Secondary | ICD-10-CM | POA: Diagnosis not present

## 2014-04-30 ENCOUNTER — Telehealth: Payer: Self-pay

## 2014-04-30 NOTE — Telephone Encounter (Signed)
Patient has questions about wound care. Please return her call. Thank you.

## 2014-05-01 NOTE — Telephone Encounter (Signed)
Patient states that around your hip on the upper thigh her stitches are bruised.  She wants to know how she should be caring for them.  Should she keep the stitches covered, should she use peroxide on them?  Please advise how to care for the area.  She states she is coming here Tuesday to have her stitches removed.

## 2014-05-01 NOTE — Telephone Encounter (Signed)
Called patient and told her that bruising is normal.  She can clean area with soap and water.  Do not use ointments or cream or peroxide.  It is ok to cover with a band aid or to keep it open to air.  Told her Judson Roch recommends that she keep covered during the day to keep dirt out.  She verbalized understanding.

## 2014-05-01 NOTE — Telephone Encounter (Signed)
Bruising can be normal.  She should clean with soap and water.  No ointments or creams and no hydrogen peroxide.  It is ok to keep covered with a dry band-aid if she would like but it is also ok to keep them open to air.  I typically recommend covered during the day when you might get dirty and open at night after about 3 days.

## 2014-05-04 ENCOUNTER — Other Ambulatory Visit: Payer: Self-pay | Admitting: Emergency Medicine

## 2014-05-05 ENCOUNTER — Ambulatory Visit (INDEPENDENT_AMBULATORY_CARE_PROVIDER_SITE_OTHER): Payer: Medicare Other | Admitting: Family Medicine

## 2014-05-05 ENCOUNTER — Ambulatory Visit: Payer: Medicare Other

## 2014-05-05 ENCOUNTER — Other Ambulatory Visit: Payer: Self-pay | Admitting: Family Medicine

## 2014-05-05 ENCOUNTER — Telehealth: Payer: Self-pay

## 2014-05-05 VITALS — BP 132/86 | HR 86 | Temp 98.0°F | Resp 16 | Ht 59.0 in | Wt 111.0 lb

## 2014-05-05 DIAGNOSIS — M79609 Pain in unspecified limb: Secondary | ICD-10-CM | POA: Diagnosis not present

## 2014-05-05 DIAGNOSIS — R350 Frequency of micturition: Secondary | ICD-10-CM

## 2014-05-05 DIAGNOSIS — T148XXA Other injury of unspecified body region, initial encounter: Secondary | ICD-10-CM

## 2014-05-05 DIAGNOSIS — IMO0002 Reserved for concepts with insufficient information to code with codable children: Secondary | ICD-10-CM

## 2014-05-05 DIAGNOSIS — R6884 Jaw pain: Secondary | ICD-10-CM

## 2014-05-05 LAB — POCT URINALYSIS DIPSTICK
Bilirubin, UA: NEGATIVE
Blood, UA: NEGATIVE
Glucose, UA: NEGATIVE
Ketones, UA: NEGATIVE
Leukocytes, UA: NEGATIVE
Nitrite, UA: NEGATIVE
Protein, UA: NEGATIVE
Spec Grav, UA: 1.015
Urobilinogen, UA: 0.2
pH, UA: 7

## 2014-05-05 LAB — POCT UA - MICROSCOPIC ONLY
Bacteria, U Microscopic: NEGATIVE
Casts, Ur, LPF, POC: NEGATIVE
Crystals, Ur, HPF, POC: NEGATIVE
Yeast, UA: NEGATIVE

## 2014-05-05 NOTE — Telephone Encounter (Signed)
Patient stated she was here today but left. She is concerned about her chin. Patient stated her chin is sore and will like to know the results of her x-ray. Please call patient at [phone].

## 2014-05-05 NOTE — Telephone Encounter (Signed)
Pt understands that her jaw xray was negative.

## 2014-05-05 NOTE — Progress Notes (Addendum)
Subjective:  This chart was scribed for Robyn Haber, MD  by Stacy Gardner, [facility] Scribe. The patient was seen in room and the patient's care was started at 10:04 AM.   Patient ID: [name], female    DOB: 01-Nov-1933, 80 y.o.   MRN: [id number]  05/05/2014  Suture / Staple Removal and Urinary Frequency   Suture / Staple Removal  Urinary Frequency  Associated symptoms include frequency.   HPI Comments: [name] is a 80 y.o. female who arrives to the [facility] complaining of fall. Pt was walking in the kitchen when her dress was snagged by her drawer, last week. Pt's left hip was lacerated by the drawer. She had a suture repair. The site is now enlarged, has dark discoloration,  and swollen. Pt was seen 04/27/14 for left medial leg lower leg swelling and was given Keflex.  Her son lives with her and she is assisted by an [facility]. Pt applied two band aide to her site. Pt would like to have her right jaw x-rayed because she fell recently and is now painful.  She has a hx of Dementia.   Review of Systems  Cardiovascular: Positive for leg swelling.  Genitourinary: Positive for frequency.  Skin: Positive for color change and wound.    Past Medical History  Diagnosis Date   Glaucoma    Dementia    Spondylosis    Foot deformity    Migraine    CAD (coronary artery disease)    Elevated BP    Diverticulosis    H/O: GI bleed    Acute renal insufficiency    GERD (gastroesophageal reflux disease)    Barrett's esophagus    Hyperglycemia    HSV-2 (herpes simplex virus 2) infection    Abnormal finding on Pap smear, ASCUS    Osteoporosis     worsening   Amputation finger     index   Hypertension    No Known Allergies Current Outpatient Prescriptions  Medication Sig Dispense Refill   aspirin 81 MG tablet Take 81 mg by mouth daily.         calcium carbonate (CALCIUM 500) 1250 MG tablet Take 3 tablets  by mouth daily.        Cholecalciferol (VITAMIN D-3 PO) Take 4,500 mg by mouth daily.         conjugated estrogens (PREMARIN) vaginal cream Place 1 Applicatorful vaginally once a week.        desonide (DESOWEN) 0.05 % cream Apply topically 2 (two) times daily.         ferrous sulfate 325 (65 FE) MG tablet Take 325 mg by mouth daily with breakfast.         gabapentin (NEURONTIN) 800 MG tablet Take 1 tablet (800 mg total) by mouth at bedtime. PATIENT NEEDS OFFICE VISIT FOR ADDITIONAL REFILLS  90 tablet  0   hydrochlorothiazide (MICROZIDE) 12.5 MG capsule take 1 tablet by mouth ON TUESDAY AND SATURDAY EACH WEEK  20 capsule  2   hydroxypropyl methylcellulose (ISOPTO TEARS) 2.5 % ophthalmic solution Place 1 drop into both eyes 2 (two) times daily.       hydrOXYzine (ATARAX/VISTARIL) 10 MG tablet Take 10-30 mg by mouth every 4 (four) hours as needed for itching.        lactose free nutrition (BOOST) LIQD Take 237 mLs by mouth daily.       metoprolol succinate (TOPROL-XL) 50 MG 24 hr tablet take  1 tablet by mouth once daily  30 tablet  3   Multiple Vitamins-Minerals (EQL CENTRAL-VITE SELECT PO) Take 1 tablet by mouth daily.        Multiple Vitamins-Minerals (HAIR/SKIN/NAILS PO) Take by mouth daily.         nystatin (MYCOSTATIN) cream Apply 1 application topically as needed (dry skin).        omeprazole (PRILOSEC) 40 MG capsule Take 1 capsule (40 mg total) by mouth daily.  30 capsule  11   ondansetron (ZOFRAN) 4 MG tablet Take 1 tablet (4 mg total) by mouth every 6 (six) hours.  12 tablet  0   traMADol-acetaminophen (ULTRACET) 37.5-325 MG per tablet Take 1 tablet by mouth every 6 (six) hours as needed for moderate pain.        verapamil (CALAN-SR) 240 MG CR tablet take 1 tablet by mouth every morning  30 tablet  3   vitamin C (ASCORBIC ACID) 500 MG tablet Take 500 mg by mouth daily.       No current facility-administered medications for this visit.       Objective:    BP 132/86    Pulse 86   Temp(Src) 98 F (36.7 C) (Oral)   Resp 16   Ht 4\' 11"  (1.499 m)   Wt 111 lb (50.349 kg)   BMI 22.41 kg/m2   SpO2 98% Physical Exam Results for orders placed in visit on 04/23/14  HIV ANTIBODY (ROUTINE TESTING)      Result Value Ref Range   HIV 1&2 Ab, 4th Generation NONREACTIVE  NONREACTIVE  RPR      Result Value Ref Range   RPR NON REAC  NON REAC  POCT CBC      Result Value Ref Range   WBC 5.5  4.6 - 10.2 K/uL   Lymph, poc 1.4  0.6 - 3.4   POC LYMPH PERCENT 25.0  10 - 50 %L   MID (cbc) 0.4  0 - 0.9   POC MID % 7.5  0 - 12 %M   POC Granulocyte 3.7  2 - 6.9   Granulocyte percent 67.5  37 - 80 %G   RBC 4.24  4.04 - 5.48 M/uL   Hemoglobin 12.1 (*) 12.2 - 16.2 g/dL   HCT, POC 38.8  37.7 - 47.9 %   MCV 91.5  80 - 97 fL   MCH, POC 28.5  27 - 31.2 pg   MCHC 31.2 (*) 31.8 - 35.4 g/dL   RDW, POC 14.0     Platelet Count, POC 408  142 - 424 K/uL   MPV 8.7  0 - 99.8 fL   Results for orders placed in visit on 05/05/14  POCT UA - MICROSCOPIC ONLY      Result Value Ref Range   WBC, Ur, HPF, POC 0-1     RBC, urine, microscopic 0-1     Bacteria, U Microscopic neg     Mucus, UA trace     Epithelial cells, urine per micros 0-2     Crystals, Ur, HPF, POC neg     Casts, Ur, LPF, POC neg     Yeast, UA neg    POCT URINALYSIS DIPSTICK      Result Value Ref Range   Color, UA yellow     Clarity, UA clear     Glucose, UA neg     Bilirubin, UA neg     Ketones, UA neg     Spec Grav, UA 1.015  Blood, UA neg     pH, UA 7.0     Protein, UA neg     Urobilinogen, UA 0.2     Nitrite, UA neg     Leukocytes, UA Negative     Patient walks with a stooped gait consistent with her kyphoscoliosis. She is slow to get about and a bit unsteady on her feet.  Patient has a 5-6 cm area of induration underneath the laceration on her left trochanter. Below this area is a 10 cm rim of ecchymosis. This skin  margins are not well approximated and so there was a large clot (scab) between the wound  margins. This was Dermabond after the 2 sutures were removed.  Patient is tender on her left jaw without any swelling or ecchymosis.  [facility] reading (PRIMARY) by  Dr. Joseph Art:  Negative jaw film.     Assessment & Plan:   For wound approximation with slow healing. Area Dermabond at. Patient has large hematoma below the wound.  Is no evidence for jaw fracture.  No evidence for urinary problem.  Signed, Carola Frost.D.

## 2014-05-08 DIAGNOSIS — N289 Disorder of kidney and ureter, unspecified: Secondary | ICD-10-CM

## 2014-05-29 DIAGNOSIS — L259 Unspecified contact dermatitis, unspecified cause: Secondary | ICD-10-CM | POA: Diagnosis not present

## 2014-05-29 DIAGNOSIS — L821 Other seborrheic keratosis: Secondary | ICD-10-CM | POA: Diagnosis not present

## 2014-05-29 DIAGNOSIS — D235 Other benign neoplasm of skin of trunk: Secondary | ICD-10-CM | POA: Diagnosis not present

## 2014-06-10 ENCOUNTER — Telehealth: Payer: Self-pay

## 2014-06-10 NOTE — Telephone Encounter (Signed)
Patient would like to know what mg of iron Dr. Everlene Farrier wants her to take. Please return call and advise. Thank you.

## 2014-06-11 NOTE — Telephone Encounter (Signed)
She should take 325 mg one a day

## 2014-06-11 NOTE — Telephone Encounter (Signed)
Pt advised.

## 2014-06-24 DIAGNOSIS — M5137 Other intervertebral disc degeneration, lumbosacral region: Secondary | ICD-10-CM | POA: Diagnosis not present

## 2014-06-24 DIAGNOSIS — G894 Chronic pain syndrome: Secondary | ICD-10-CM | POA: Diagnosis not present

## 2014-07-08 DIAGNOSIS — H04129 Dry eye syndrome of unspecified lacrimal gland: Secondary | ICD-10-CM | POA: Diagnosis not present

## 2014-07-08 DIAGNOSIS — H43819 Vitreous degeneration, unspecified eye: Secondary | ICD-10-CM | POA: Diagnosis not present

## 2014-07-08 DIAGNOSIS — H2510 Age-related nuclear cataract, unspecified eye: Secondary | ICD-10-CM | POA: Diagnosis not present

## 2014-07-08 DIAGNOSIS — H4011X0 Primary open-angle glaucoma, stage unspecified: Secondary | ICD-10-CM | POA: Diagnosis not present

## 2014-07-14 ENCOUNTER — Telehealth: Payer: Self-pay

## 2014-07-14 NOTE — Telephone Encounter (Signed)
[facility] [place]   For allergy tests.     [phone]

## 2014-07-15 NOTE — Telephone Encounter (Signed)
Spoke to pt, she has an appointment with Dr. Everlene Farrier on 07/21/2014, she will discuss the referral at that time.  This issue has been resolved.

## 2014-07-21 ENCOUNTER — Ambulatory Visit (INDEPENDENT_AMBULATORY_CARE_PROVIDER_SITE_OTHER): Payer: Medicare Other | Admitting: Emergency Medicine

## 2014-07-21 ENCOUNTER — Encounter: Payer: Self-pay | Admitting: Emergency Medicine

## 2014-07-21 VITALS — BP 160/74 | HR 76 | Temp 98.0°F | Resp 16 | Ht <= 58 in | Wt 99.4 lb

## 2014-07-21 DIAGNOSIS — Z23 Encounter for immunization: Secondary | ICD-10-CM

## 2014-07-21 DIAGNOSIS — Z209 Contact with and (suspected) exposure to unspecified communicable disease: Secondary | ICD-10-CM | POA: Diagnosis not present

## 2014-07-21 DIAGNOSIS — K259 Gastric ulcer, unspecified as acute or chronic, without hemorrhage or perforation: Secondary | ICD-10-CM | POA: Diagnosis not present

## 2014-07-21 DIAGNOSIS — R319 Hematuria, unspecified: Secondary | ICD-10-CM | POA: Diagnosis not present

## 2014-07-21 DIAGNOSIS — R21 Rash and other nonspecific skin eruption: Secondary | ICD-10-CM

## 2014-07-21 DIAGNOSIS — R634 Abnormal weight loss: Secondary | ICD-10-CM

## 2014-07-21 LAB — POCT UA - MICROSCOPIC ONLY
Bacteria, U Microscopic: 2
CRYSTALS, UR, HPF, POC: NEGATIVE
Casts, Ur, LPF, POC: NEGATIVE
Mucus, UA: NEGATIVE
RBC, urine, microscopic: 0
YEAST UA: NEGATIVE

## 2014-07-21 LAB — CBC WITH DIFFERENTIAL/PLATELET
Basophils Absolute: 0 10*3/uL (ref 0.0–0.1)
Basophils Relative: 1 % (ref 0–1)
EOS ABS: 0.3 10*3/uL (ref 0.0–0.7)
EOS PCT: 7 % — AB (ref 0–5)
HCT: 37.3 % (ref 36.0–46.0)
Hemoglobin: 12.8 g/dL (ref 12.0–15.0)
LYMPHS ABS: 0.8 10*3/uL (ref 0.7–4.0)
Lymphocytes Relative: 17 % (ref 12–46)
MCH: 28.7 pg (ref 26.0–34.0)
MCHC: 34.3 g/dL (ref 30.0–36.0)
MCV: 83.6 fL (ref 78.0–100.0)
Monocytes Absolute: 0.2 10*3/uL (ref 0.1–1.0)
Monocytes Relative: 5 % (ref 3–12)
Neutro Abs: 3.2 10*3/uL (ref 1.7–7.7)
Neutrophils Relative %: 70 % (ref 43–77)
PLATELETS: 378 10*3/uL (ref 150–400)
RBC: 4.46 MIL/uL (ref 3.87–5.11)
RDW: 14.4 % (ref 11.5–15.5)
WBC: 4.5 10*3/uL (ref 4.0–10.5)

## 2014-07-21 LAB — POCT URINALYSIS DIPSTICK
Bilirubin, UA: NEGATIVE
Blood, UA: NEGATIVE
Glucose, UA: NEGATIVE
Ketones, UA: NEGATIVE
NITRITE UA: POSITIVE
PROTEIN UA: NEGATIVE
Spec Grav, UA: 1.01
Urobilinogen, UA: 0.2
pH, UA: 7.5

## 2014-07-21 NOTE — Progress Notes (Signed)
Subjective:  This chart was scribed for [name] by [name], Medial Scribe. This patient was seen in room 21 and the patient's care was started at 1:20 PM.    Patient ID: [name], female    DOB: 10-Sep-1933, 80 y.o.   MRN: [id number]  HPI HPI Comments: [name] is a 80 y.o. female who presents to the [facility] requesting three month follow up.   Dermatology. Patient is complaining of nonspecific rash on legs, present for a few weeks. Last saw Dr. Allyson [name] one month ago, but her current rash wasn't present at the time. Patient states that she needs a referral to an allergist.  Immunizations. Patient agrees to pneumonia vaccination today.   GU. Patient has history of UTIs. Today patient reports vaginal itching, but denies burning with urination.   GI. Patient has history of Barrett's esophagus, gastric ulcers and endoscopy. She states that due to her ulcers her diet is limited. Patient states that her appetite is stable,she has plenty of energy, but she is losing weight. Patient has lost 20lbs within the last year. Patient sates that she does not have enough food, she skips meals, and she is hungry all of the time. Patient states that she will drink 2 supplements a day to increase her weight.    Patient Active Problem List   Diagnosis Date Noted  . Gastric ulcer without hemorrhage or perforation 07/22/2013  . Syphilis 02/27/2012  . Dementia   . Spondylosis   . Foot deformity   . Migraine   . CAD (coronary artery disease)   . Acute renal insufficiency   . GERD (gastroesophageal reflux disease)   . Hyperglycemia   . HSV-2 (herpes simplex virus 2) infection   . Abnormal finding on Pap smear, ASCUS   . Osteoporosis   . CONSTIPATION 12/12/2010  . NONSPECIFIC ABNORMAL FINDING IN STOOL CONTENTS 12/12/2010  . INFECTION, SKIN AND SOFT TISSUE 11/10/2010  . ABDOMINAL PAIN RIGHT UPPER QUADRANT 11/10/2010  . PUD, HX OF 11/10/2010  . Hemorrhage of rectum  and anus 02/21/2010  . ANEMIA, IRON DEFICIENCY 06/17/2008  . BARRETTS ESOPHAGUS 06/17/2008  . HYPERLIPIDEMIA 06/16/2008  . GLAUCOMA 06/16/2008  . HYPERTENSION 06/16/2008  . DIVERTICULOSIS, COLON 06/16/2008  . HEMOCCULT POSITIVE STOOL 06/16/2008  . ARTHRITIS 06/16/2008  . SLEEP APNEA 06/16/2008   Past Medical History  Diagnosis Date  . Glaucoma   . Dementia   . Spondylosis   . Foot deformity   . Migraine   . CAD (coronary artery disease)   . Elevated BP   . Diverticulosis   . H/O: GI bleed   . Acute renal insufficiency   . GERD (gastroesophageal reflux disease)   . Barrett's esophagus   . Hyperglycemia   . HSV-2 (herpes simplex virus 2) infection   . Abnormal finding on Pap smear, ASCUS   . Osteoporosis     worsening  . Amputation finger     index  . Hypertension    Past Surgical History  Procedure Laterality Date  . Partial hysterectomy    . Amputation finger / thumb      secondary to osteomyelitis  . Foot surgery      right  . Upper gastrointestinal endoscopy    . Colonoscopy     No Known Allergies Prior to Admission medications   Medication Sig Start Date End Date Taking? Authorizing Provider  aspirin 81 MG tablet Take 81 mg by mouth daily.  Historical Provider, [name]  calcium carbonate (CALCIUM 500) 1250 MG tablet Take 3 tablets by mouth daily.     Historical Provider, [name]  Cholecalciferol (VITAMIN D-3 PO) Take 4,500 mg by mouth daily.      Historical Provider, [name]  conjugated estrogens (PREMARIN) vaginal cream Place 1 Applicatorful vaginally once a week.     Historical Provider, [name]  desonide (DESOWEN) 0.05 % cream Apply topically 2 (two) times daily.      Historical Provider, [name]  ferrous sulfate 325 (65 FE) MG tablet Take 325 mg by mouth daily with breakfast.      Historical Provider, [name]  gabapentin (NEURONTIN) 800 MG tablet take 1 tablet by mouth at bedtime    [name] E Egan, PA-C  hydrochlorothiazide (MICROZIDE) 12.5 MG capsule take 1 tablet by mouth ON  TUESDAY AND SATURDAY EACH WEEK    [name] S Jeffery, PA-C  hydroxypropyl methylcellulose (ISOPTO TEARS) 2.5 % ophthalmic solution Place 1 drop into both eyes 2 (two) times daily.    Historical Provider, [name]  hydrOXYzine (ATARAX/VISTARIL) 10 MG tablet Take 10-30 mg by mouth every 4 (four) hours as needed for itching.     Historical Provider, [name]  lactose free nutrition (BOOST) LIQD Take 237 mLs by mouth daily.    Historical Provider, [name]  metoprolol succinate (TOPROL-XL) 50 MG 24 hr tablet take 1 tablet by mouth once daily 04/06/14   [name] Russian, [name]  Multiple Vitamins-Minerals (EQL CENTRAL-VITE SELECT PO) Take 1 tablet by mouth daily.     Historical Provider, [name]  Multiple Vitamins-Minerals (HAIR/SKIN/NAILS PO) Take by mouth daily.      Historical Provider, [name]  nystatin (MYCOSTATIN) cream Apply 1 application topically as needed (dry skin).     Historical Provider, [name]  omeprazole (PRILOSEC) 40 MG capsule Take 1 capsule (40 mg total) by mouth daily. 10/20/13   [name] Castle, [name]  ondansetron (ZOFRAN) 4 MG tablet Take 1 tablet (4 mg total) by mouth every 6 (six) hours. 02/26/14   [name] Manifold, [name]  traMADol-acetaminophen (ULTRACET) 37.5-325 MG per tablet Take 1 tablet by mouth every 6 (six) hours as needed for moderate pain.  04/24/13   Historical Provider, [name]  verapamil (CALAN-SR) 240 MG CR tablet take 1 tablet by mouth every morning 04/06/14   [name] Russian, [name]  vitamin C (ASCORBIC ACID) 500 MG tablet Take 500 mg by mouth daily.    Historical Provider, [name]   History   Social History  . Marital Status: Single    Spouse Name: N/A    Number of Children: 1  . Years of Education: N/A   Occupational History  .     Social History Main Topics  . Smoking status: Never Smoker   . Smokeless tobacco: Never Used  . Alcohol Use: No  . Drug Use: No  . Sexual Activity: Not on file   Other Topics Concern  . Not on file   Social History Narrative  . No narrative on file      Review of Systems    Constitutional: Negative for fever, chills and unexpected weight change.  Genitourinary: Negative for dysuria.  Skin: Positive for rash.  Psychiatric/Behavioral: Positive for confusion.       Objective:   Physical Exam  CONSTITUTIONAL: Well developed/well nourished HEAD: Normocephalic/atraumatic EYES: EOMI/PERRL ENMT: Mucous membranes moist NECK: supple no meningeal signs SPINE:entire spine nontender; severe kyphoscoliosis and she walks at a 90 deg angle CV: S1/S2 noted, no murmurs/rubs/gallops noted LUNGS: Lungs are clear to auscultation  bilaterally, no apparent distress ABDOMEN: soft, nontender, no rebound or guarding GU:no cva tenderness NEURO: Pt is awake/alert, moves all extremitiesx4 EXTREMITIES: pulses normal, full ROM; there are flat 1x1.5 cm red, papules over both legs  SKIN: warm, color normal; see above PSYCH: no abnormalities of mood noted  Filed Vitals:   07/21/14 1315  BP: 160/74  Pulse: 76  Temp: 98 F (36.7 C)  TempSrc: Oral  Resp: 16  Height: 4\' 10"  (1.473 m)  Weight: 99 lb 6.4 oz (45.088 kg)  SpO2: 97%   Results for orders placed in visit on 05/05/14  POCT UA - MICROSCOPIC ONLY      Result Value Ref Range   WBC, Ur, HPF, POC 0-1     RBC, urine, microscopic 0-1     Bacteria, U Microscopic neg     Mucus, UA trace     Epithelial cells, urine per micros 0-2     Crystals, Ur, HPF, POC neg     Casts, Ur, LPF, POC neg     Yeast, UA neg    POCT URINALYSIS DIPSTICK      Result Value Ref Range   Color, UA yellow     Clarity, UA clear     Glucose, UA neg     Bilirubin, UA neg     Ketones, UA neg     Spec Grav, UA 1.015     Blood, UA neg     pH, UA 7.0     Protein, UA neg     Urobilinogen, UA 0.2     Nitrite, UA neg     Leukocytes, UA Negative          Assessment & Plan:  We'll see about making a referral to visit the home to see if she needs assistance regarding getting access to food in the house. She is going to try protein supplements  twice a day. Routine labs were done to include thyroid studies regarding her weight loss. I will check her back in 6 weeks. Referral made back to Dr. Deatra Ina because she has a history of Barrett's and gastric ulcers. STD testing was done because of her history of syphilis. Urine culture was done she is a history recurrent urinary tract infections referral made to [facility] I personally performed the services described in this documentation, which was scribed in my presence. The recorded information has been reviewed and is accurate.

## 2014-07-21 NOTE — Progress Notes (Deleted)
   Subjective:    Patient ID: [name], female    DOB: 18-Jul-1933, 80 y.o.   MRN: [id number]  HPI    Review of Systems     Objective:   Physical Exam        Assessment & Plan:

## 2014-07-22 ENCOUNTER — Telehealth: Payer: Self-pay | Admitting: Family Medicine

## 2014-07-22 LAB — COMPLETE METABOLIC PANEL WITH GFR
ALT: 10 U/L (ref 0–35)
AST: 13 U/L (ref 0–37)
Albumin: 3.7 g/dL (ref 3.5–5.2)
Alkaline Phosphatase: 47 U/L (ref 39–117)
BILIRUBIN TOTAL: 0.4 mg/dL (ref 0.2–1.2)
BUN: 13 mg/dL (ref 6–23)
CO2: 26 meq/L (ref 19–32)
Calcium: 9.5 mg/dL (ref 8.4–10.5)
Chloride: 104 mEq/L (ref 96–112)
Creat: 0.82 mg/dL (ref 0.50–1.10)
GFR, EST AFRICAN AMERICAN: 79 mL/min
GFR, EST NON AFRICAN AMERICAN: 69 mL/min
GLUCOSE: 90 mg/dL (ref 70–99)
Potassium: 3.9 mEq/L (ref 3.5–5.3)
Sodium: 143 mEq/L (ref 135–145)
Total Protein: 6.5 g/dL (ref 6.0–8.3)

## 2014-07-22 LAB — RPR

## 2014-07-22 LAB — TSH: TSH: 1.255 u[IU]/mL (ref 0.350–4.500)

## 2014-07-22 LAB — T4, FREE: Free T4: 0.9 ng/dL (ref 0.80–1.80)

## 2014-07-22 LAB — HIV ANTIBODY (ROUTINE TESTING W REFLEX): HIV 1&2 Ab, 4th Generation: NONREACTIVE

## 2014-07-22 NOTE — Telephone Encounter (Signed)
Also call both the and I need to see what the GI specialist finds when he reevaluated her. Be sure that referral has been made and is in process.

## 2014-07-22 NOTE — Telephone Encounter (Signed)
Please advise 

## 2014-07-22 NOTE — Telephone Encounter (Signed)
Please send in a B complex multivitamin and to take one a day. She is scheduled to see me in one month we'll monitor what her weight is doing at that time .

## 2014-07-22 NOTE — Telephone Encounter (Signed)
I called [facility] today [facility]  and they will contact patient and get her set up with a social worker to see if patient can get meal program. Patient

## 2014-07-22 NOTE — Telephone Encounter (Signed)
Patient says she would like to have something called in for her appetite. She says she weighs 90 pounds and she is very concerned. She states she is drinking boost, but it gives her gas. Patient wants to "eat like a pig".

## 2014-07-24 ENCOUNTER — Encounter: Payer: Self-pay | Admitting: *Deleted

## 2014-07-24 ENCOUNTER — Telehealth: Payer: Self-pay | Admitting: Gastroenterology

## 2014-07-24 ENCOUNTER — Telehealth: Payer: Self-pay

## 2014-07-24 NOTE — Telephone Encounter (Signed)
Ms. Stephen called and wanted to know what she could eat to help gain wt. Encouraged pt to make sure she eats 3 meals a day and drinks boost. Also encouraged pt to make some milkshakes to drink. Pt has OV scheduled with Dr. Deatra Ina and know she does not have to wear anything special and she can eat before she comes to the visit.   Ms. Schwartzkopf would like to know if Dr. Deatra Ina could recommend something to help with her appetite. Please advise.

## 2014-07-24 NOTE — Telephone Encounter (Signed)
Pt aware.

## 2014-07-24 NOTE — Telephone Encounter (Signed)
Patient called. States Dr. Kelby Fam office called and said her appt will be October 2nd. States she is waiting on a nurse to call with more details and she will call us back when she knows more. CB# [phone]

## 2014-07-24 NOTE — Telephone Encounter (Signed)
I agree with the recommendations as stated.  There is no specific appetite stimulant.

## 2014-07-25 LAB — URINE CULTURE: Colony Count: >=100000

## 2014-07-26 ENCOUNTER — Other Ambulatory Visit: Payer: Self-pay | Admitting: Emergency Medicine

## 2014-07-27 ENCOUNTER — Telehealth: Payer: Self-pay | Admitting: Gastroenterology

## 2014-07-27 NOTE — Telephone Encounter (Signed)
Pt scheduled to see Alonza Bogus PA 07/31/14@2 :30pm. Pt states she will call back if she can get transportation worked out to come for appt.

## 2014-07-27 NOTE — Telephone Encounter (Signed)
Dr Everlene Farrier, you just saw pt last week, but not for HTN. Do you want to give RFs or RTC?

## 2014-07-28 ENCOUNTER — Telehealth: Payer: Self-pay

## 2014-07-28 DIAGNOSIS — L259 Unspecified contact dermatitis, unspecified cause: Secondary | ICD-10-CM | POA: Diagnosis not present

## 2014-07-28 MED ORDER — CEPHALEXIN 500 MG PO CAPS: 500.0000 mg | ORAL_CAPSULE | Freq: Two times a day (BID) | ORAL | Status: DC

## 2014-07-28 NOTE — Telephone Encounter (Signed)
FYI- Pt has a GI appt scheduled 8/7 also

## 2014-07-28 NOTE — Telephone Encounter (Signed)
Pt called to see if we could call in her Abx. Per Dr Perfecto Kingdom instr's on lab results, sent in cephalexin to [facility] on [address].

## 2014-07-31 ENCOUNTER — Telehealth: Payer: Self-pay

## 2014-07-31 ENCOUNTER — Ambulatory Visit (INDEPENDENT_AMBULATORY_CARE_PROVIDER_SITE_OTHER): Payer: Medicare Other | Admitting: Gastroenterology

## 2014-07-31 ENCOUNTER — Encounter: Payer: Self-pay | Admitting: Gastroenterology

## 2014-07-31 VITALS — BP 118/60 | HR 72 | Ht <= 58 in | Wt 108.2 lb

## 2014-07-31 DIAGNOSIS — K219 Gastro-esophageal reflux disease without esophagitis: Secondary | ICD-10-CM | POA: Diagnosis not present

## 2014-07-31 DIAGNOSIS — R634 Abnormal weight loss: Secondary | ICD-10-CM | POA: Diagnosis not present

## 2014-07-31 DIAGNOSIS — K2270 Barrett's esophagus without dysplasia: Secondary | ICD-10-CM

## 2014-07-31 MED ORDER — PROMETHAZINE HCL 25 MG PO TABS: 25.0000 mg | ORAL_TABLET | Freq: Four times a day (QID) | ORAL | Status: DC | PRN

## 2014-07-31 NOTE — Telephone Encounter (Signed)
Pt is calling about lab results -she received a letter stating that we have been unable to reach her

## 2014-07-31 NOTE — Patient Instructions (Signed)
We have sent the following medications to your pharmacy for you to pick up at your convenience: Phenergan   Please call back if you are not feeling better

## 2014-07-31 NOTE — Progress Notes (Signed)
     07/31/2014 [name] [id number] [birth year]   History of Present Illness:  This is a 80 year old female who is known to Dr. Deatra Ina for treatment of her Barrett's esophagus.  Last EGD was 05/2013 at which time she was found to have a hiatal hernia, Barrett's esophagus, and gastric ulcers (thought to be due to Excedrin).  Gastric biopsies ok and negative for Hpylori.  Esophageal biopsies confirmed Barrett's without dysplasia and recommended repeat EGD in 3 years from that time.  She has discontinue the Excedrin.  She is on omeprazole 40 mg daily.  She is here today for weight loss.  She is actually up 9 pounds from when she saw her PCP on 07/21/2014.  She has been drinking Boost and is being conscious about eating.  Is getting [employer] [facility].  Has some occasional nausea for which she takes phenergan prn and is asking for a prescription renewal.  Very difficult to keep patient's thoughts on topic and hard to follow conversation due to random change in topics and mumbled/rambling speech.  Colonoscopy 02/2010 with ulcers in the rectum/stercoral ulcers, diverticula; rectal biopsies showed features suggestive of prolapse.    CT scan of the abdomen and pelvis with contrast 02/2014 showed the following:  "IMPRESSION: 1. There remains a large hiatal hernia-partially intrathoracic stomach. 2. The appearance of the small and large bowel does not suggestobstruction. I cannot exclude gastroenteritis in the appropriateclinical setting. There is no evidence of acute diverticulitis. Theappendix is not discretely demonstrated. 3. There is no evidence of acute urinary tract abnormality. The urinary bladder is moderately distended. 4. The gallbladder is mildly distended without evidence of stones.  There are stable cystic changes in the liver. There are no findings to suggest acute pancreatitis."    Current Medications, Allergies, Past Medical History, Past Surgical History, Family History and Social History  were reviewed in [employer] record.   Physical Exam: BP 118/60  Pulse 72  Ht 4' 8.5" (1.435 m)  Wt 108 lb 4 oz (49.102 kg)  BMI 23.84 kg/m2 General: Well developed black female in no acute distress Head: Normocephalic and atraumatic Eyes:  Sclerae anicteric, conjunctiva pink  Ears: Normal auditory acuity Lungs:  Clear throughout to auscultation Heart: Regular rate and rhythm Abdomen: Soft, non-distended.  Normal bowel sounds.  Non-tender. Musculoskeletal: Symmetrical with no gross deformities  Extremities: No edema  Neurological: Alert oriented x 4, grossly non-focal Psychological:  Alert and cooperative. Normal mood and affect  Assessment and Recommendations: -Weight loss:  Weight is actually up 9 pounds from when she saw her PCP on 7/28.  She has been drinking some Boost and being conscious about eating.  Getting [facility] [facility].   -Barrett's:  On omeprazole 40 mg daily.  Will continue.  Will renew prescription for phenergan for nausea.  Repeat EGD in 05/2016. -History of gastric ulcers, on EGD in 05/2013:  Thought to be due to Excedrin.  Now off of the Excedrin.  Ulcers should be healed, especially on daily PPI.

## 2014-08-01 NOTE — Telephone Encounter (Signed)
Patient is returning our phone call regarding her lab results.   Best#: [phone]

## 2014-08-01 NOTE — Telephone Encounter (Signed)
Letter was sent before pt was notified. Looks like she has been taken care of. Tried to call her to reassure her, no answer/no VM

## 2014-08-04 NOTE — Telephone Encounter (Signed)
Pt called back inquiring about lab results and [facility] [employer] [facility]

## 2014-08-06 NOTE — Progress Notes (Signed)
Reviewed and agree with management. Would have pt contact us in 1 month if weught does not remain stable Herbie Baltimore D. Deatra Ina, M.D., [facility].

## 2014-08-08 ENCOUNTER — Telehealth: Payer: Self-pay | Admitting: Emergency Medicine

## 2014-08-08 ENCOUNTER — Other Ambulatory Visit: Payer: Self-pay | Admitting: Emergency Medicine

## 2014-08-08 MED ORDER — GABAPENTIN 800 MG PO TABS: ORAL_TABLET | ORAL | Status: DC

## 2014-08-08 NOTE — Telephone Encounter (Signed)
Per Dr. Everlene Farrier, ok to RF. Sent

## 2014-08-08 NOTE — Telephone Encounter (Signed)
Refill on Gabapentin. [facility]    [phone]

## 2014-09-03 ENCOUNTER — Encounter: Payer: Self-pay | Admitting: Emergency Medicine

## 2014-09-03 ENCOUNTER — Ambulatory Visit (INDEPENDENT_AMBULATORY_CARE_PROVIDER_SITE_OTHER): Payer: Medicare Other | Admitting: Emergency Medicine

## 2014-09-03 VITALS — BP 126/60 | HR 86 | Temp 97.7°F | Resp 16 | Ht <= 58 in | Wt 108.6 lb

## 2014-09-03 DIAGNOSIS — Z23 Encounter for immunization: Secondary | ICD-10-CM

## 2014-09-03 NOTE — Progress Notes (Signed)
   Subjective:    Patient ID: [name], female    DOB: Aug 11, 1933, 80 y.o.   MRN: [id number]  This chart was scribed for [name], MD by Edison Simon, ED Scribe. This patient was seen in room 29 and the patient's care was started at 11:47 AM. .   HPI  HPI Comments: [name] is a 80 y.o. female who presents to the [facility] for follow up. She reports recent intentional weight gain; she states she gets 1 meal a day from [facility] [facility]. She complains of a splinter in her thumb and reports doing yardwork. She states she has been shaky. She reports what she believes is an allergic reaction to her medial left lower leg and associated itchiness.   Review of Systems  Constitutional:       Shaking  Skin:       Reports splinter in her thumb, itchiness  Allergic/Immunologic: Positive for environmental allergies.       Objective:   Physical Exam  Nursing note and vitals reviewed. Constitutional: She is oriented to person, place, and time. She appears well-developed and well-nourished. No distress.  HENT:  Head: Normocephalic and atraumatic.  Eyes: Conjunctivae and EOM are normal.  Neck: Normal range of motion. Neck supple.  Cardiovascular: Normal rate, regular rhythm and normal heart sounds.   No murmur heard. Pulmonary/Chest: Effort normal and breath sounds normal. No respiratory distress. She has no wheezes. She has no rales.  Musculoskeletal:  Severe kyphoscoliosis of spine  Neurological: She is alert and oriented to person, place, and time.  Skin: Skin is warm and dry.  No splinter under nail  Psychiatric: She has a normal mood and affect.        Assessment & Plan:  Since she got [employer] her weight is up 9 pounds. She looks great. No change in medications I personally performed the services described in this documentation, which was scribed in my presence. The recorded information has been reviewed and is accurate.

## 2014-09-11 DIAGNOSIS — J309 Allergic rhinitis, unspecified: Secondary | ICD-10-CM | POA: Diagnosis not present

## 2014-09-11 DIAGNOSIS — R21 Rash and other nonspecific skin eruption: Secondary | ICD-10-CM | POA: Diagnosis not present

## 2014-09-12 ENCOUNTER — Telehealth: Payer: Self-pay

## 2014-09-12 NOTE — Telephone Encounter (Signed)
Spoke with pt. She just had allergy testing done. Wants to know if she can use triamcinolone cream on her back. Per Dr. Everlene Farrier, ok to use this and to take Benadryl. Pt notified.

## 2014-09-18 DIAGNOSIS — Z1231 Encounter for screening mammogram for malignant neoplasm of breast: Secondary | ICD-10-CM | POA: Diagnosis not present

## 2014-09-18 DIAGNOSIS — Z803 Family history of malignant neoplasm of breast: Secondary | ICD-10-CM | POA: Diagnosis not present

## 2014-09-25 ENCOUNTER — Ambulatory Visit: Payer: Medicare Other | Admitting: Gastroenterology

## 2014-09-29 ENCOUNTER — Telehealth: Payer: Self-pay

## 2014-09-29 NOTE — Telephone Encounter (Signed)
Pt had her mammogram at [facility] on 09-18-14. She called them and they told her it was normal. Dr. Everlene Farrier, [facility]

## 2014-09-29 NOTE — Telephone Encounter (Signed)
Patient is calling for her mamogram results.    [phone]

## 2014-10-10 ENCOUNTER — Telehealth: Payer: Self-pay

## 2014-10-10 NOTE — Telephone Encounter (Signed)
Pt called to let Dr. Everlene Farrier know that she is experiencing A LOT of gas. She has no clue why and wants to speak with him about it.

## 2014-10-11 NOTE — Telephone Encounter (Signed)
Spoke with patient and she stated she has not been using Miralax lately and she has been constipated and maybe its from constipation so she is going to try using Miralx everyday for the next week and see if that helps

## 2014-10-15 ENCOUNTER — Encounter: Payer: Self-pay | Admitting: Emergency Medicine

## 2014-10-19 ENCOUNTER — Telehealth: Payer: Self-pay

## 2014-10-19 NOTE — Telephone Encounter (Signed)
Pt states she is in need of her TRAMADOL-ACETAMINOPHEN 37 5-325 MGS. Please call [place]

## 2014-10-20 MED ORDER — TRAMADOL-ACETAMINOPHEN 37.5-325 MG PO TABS: 1.0000 | ORAL_TABLET | Freq: Four times a day (QID) | ORAL | Status: DC | PRN

## 2014-10-20 NOTE — Telephone Encounter (Signed)
Okay to refill her tramadol and I will sign it.

## 2014-10-20 NOTE — Telephone Encounter (Signed)
Rx called into pharmacy as prescribed.  Pt advised.

## 2014-10-26 ENCOUNTER — Telehealth: Payer: Self-pay | Admitting: Emergency Medicine

## 2014-10-26 NOTE — Telephone Encounter (Signed)
Pt notified that rx has been sent in. 

## 2014-10-26 NOTE — Telephone Encounter (Signed)
Pt calling enquiring about rx refills, please advise pt

## 2014-11-02 ENCOUNTER — Telehealth: Payer: Self-pay | Admitting: Gastroenterology

## 2014-11-02 MED ORDER — OMEPRAZOLE 40 MG PO CPDR: 40.0000 mg | DELAYED_RELEASE_CAPSULE | Freq: Every day | ORAL | Status: DC

## 2014-11-03 MED ORDER — OMEPRAZOLE 40 MG PO CPDR: 40.0000 mg | DELAYED_RELEASE_CAPSULE | Freq: Every day | ORAL | Status: DC

## 2014-11-03 NOTE — Telephone Encounter (Signed)
Med sent to pharmacy.

## 2014-11-05 ENCOUNTER — Telehealth: Payer: Self-pay | Admitting: Gastroenterology

## 2014-11-05 NOTE — Telephone Encounter (Signed)
Spoke with patient about what foods are proteins.

## 2014-11-13 ENCOUNTER — Other Ambulatory Visit: Payer: Self-pay | Admitting: Physician Assistant

## 2014-11-25 ENCOUNTER — Ambulatory Visit (INDEPENDENT_AMBULATORY_CARE_PROVIDER_SITE_OTHER): Payer: Medicare Other | Admitting: Gastroenterology

## 2014-11-25 ENCOUNTER — Encounter: Payer: Self-pay | Admitting: Gastroenterology

## 2014-11-25 VITALS — BP 112/64 | HR 76 | Ht <= 58 in | Wt 112.0 lb

## 2014-11-25 DIAGNOSIS — D509 Iron deficiency anemia, unspecified: Secondary | ICD-10-CM | POA: Diagnosis not present

## 2014-11-25 DIAGNOSIS — K625 Hemorrhage of anus and rectum: Secondary | ICD-10-CM | POA: Insufficient documentation

## 2014-11-25 DIAGNOSIS — R634 Abnormal weight loss: Secondary | ICD-10-CM

## 2014-11-25 NOTE — Assessment & Plan Note (Signed)
Patient has, what sounds like very limited intermittent rectal bleeding which is probably hemorrhoidal.  Recommendations #1 stool Hemoccults

## 2014-11-25 NOTE — Assessment & Plan Note (Signed)
She has returned to baseline weight.  She attributes weight loss to difficulty obtaining adequate nutrition which has since improved since she's getting supplementary meals.  Needs no further GI workup

## 2014-11-25 NOTE — Progress Notes (Signed)
_                                                                                                                History of Present Illness:  [name] is a 80 year old Afro-American female with history of coronary artery disease, Barrett's esophagus, referred for weight loss.  Between March and July she had a 10 pound weight loss she attributed that to eating in the yard and not having the ability to have adequate meals.  Subsequent to then she's getting Meals on Wheels and has gained 10 pounds.  CT scan in March, 2015 demonstrated a large hiatal hernia that was partially intrathoracic.  In July, 2015 CBC and comprehensive metabolic profile were normal.  Chromic complaint is occasional blood mixed with the stool.  She is without change in bowel habits, abdominal or rectal pain.  Colonoscopy in 2009 demonstrated diverticulosis.   Past Medical History  Diagnosis Date  . Glaucoma   . Dementia   . Spondylosis   . Foot deformity   . Migraine   . CAD (coronary artery disease)   . Elevated BP   . Diverticulosis   . H/O: GI bleed   . Acute renal insufficiency   . GERD (gastroesophageal reflux disease)   . Barrett's esophagus   . Hyperglycemia   . HSV-2 (herpes simplex virus 2) infection   . Abnormal finding on Pap smear, ASCUS   . Osteoporosis     worsening  . Amputation finger     index  . Hypertension    Past Surgical History  Procedure Laterality Date  . Partial hysterectomy    . Amputation finger / thumb      secondary to osteomyelitis  . Foot surgery      right  . Upper gastrointestinal endoscopy    . Colonoscopy     family history includes Alcohol abuse in her son; Breast cancer in her mother; Heart attack in her mother; Heart disease in her father. There is no history of Colon cancer, Stomach cancer, or Rectal cancer. Current Outpatient Prescriptions  Medication Sig Dispense Refill  . aspirin 81 MG tablet Take 81 mg by mouth daily.      . calcium carbonate  (CALCIUM 500) 1250 MG tablet Take 3 tablets by mouth daily.     . Cholecalciferol (VITAMIN D-3 PO) Take 4,500 mg by mouth daily.      Marland Kitchen conjugated estrogens (PREMARIN) vaginal cream Place 1 Applicatorful vaginally once a week.     . desonide (DESOWEN) 0.05 % cream Apply topically 2 (two) times daily.      . ferrous sulfate 325 (65 FE) MG tablet Take 325 mg by mouth daily with breakfast.      . gabapentin (NEURONTIN) 800 MG tablet TAKE 1 TABLE BY MOUTH AT BEDTIME 90 tablet 0  . hydrochlorothiazide (MICROZIDE) 12.5 MG capsule take 1 tablet by mouth TUESDAY AND SATURDAY every week 27 capsule 0  . hydroxypropyl methylcellulose (ISOPTO TEARS) 2.5 % ophthalmic solution Place 1 drop into  both eyes 2 (two) times daily.    . hydrOXYzine (ATARAX/VISTARIL) 10 MG tablet Take 10-30 mg by mouth every 4 (four) hours as needed for itching.     . lactose free nutrition (BOOST) LIQD Take 237 mLs by mouth daily.    . metoprolol succinate (TOPROL-XL) 50 MG 24 hr tablet take 1 tablet by mouth once daily 30 tablet 5  . Multiple Vitamins-Minerals (EQL CENTRAL-VITE SELECT PO) Take 1 tablet by mouth daily.     . Multiple Vitamins-Minerals (HAIR/SKIN/NAILS PO) Take by mouth daily.      Marland Kitchen nystatin (MYCOSTATIN) cream Apply 1 application topically as needed (dry skin).     Marland Kitchen omeprazole (PRILOSEC) 40 MG capsule Take 1 capsule (40 mg total) by mouth daily. 30 capsule 11  . promethazine (PHENERGAN) 25 MG tablet Take 1 tablet (25 mg total) by mouth every 6 (six) hours as needed for nausea or vomiting. 30 tablet 6  . traMADol-acetaminophen (ULTRACET) 37.5-325 MG per tablet Take 1 tablet by mouth every 6 (six) hours as needed for moderate pain. 60 tablet 0  . verapamil (CALAN-SR) 240 MG CR tablet take 1 tablet by mouth every morning 30 tablet 5  . vitamin C (ASCORBIC ACID) 500 MG tablet Take 500 mg by mouth daily.     No current facility-administered medications for this visit.   Allergies as of 11/25/2014  . (No Known  Allergies)    reports that she has never smoked. She has never used smokeless tobacco. She reports that she does not drink alcohol or use illicit drugs.   Review of Systems: Pertinent positive and negative review of systems were noted in the above HPI section. All other review of systems were otherwise negative.  Vital signs were reviewed in today's medical record Physical Exam: General: Elderly female in no acute distress Skin: anicteric Head: Normocephalic and atraumatic Eyes:  sclerae anicteric, EOMI Ears: Normal auditory acuity Mouth: No deformity or lesions Neck: Supple, no masses or thyromegaly Lungs: Clear throughout to auscultation Heart: Regular rate and rhythm; no murmurs, rubs or bruits Abdomen: Soft, non tender and non distended. No masses, hepatosplenomegaly or hernias noted. Normal Bowel sounds Rectal:deferred Musculoskeletal: Symmetrical with no gross deformities  Skin: No lesions on visible extremities Pulses:  Normal pulses noted Extremities: No clubbing, cyanosis, edema .  She has scoliosis of the spine noted Neurological: Alert oriented x 4, grossly nonfocal Cervical Nodes:  No significant cervical adenopathy Inguinal Nodes: No significant inguinal adenopathy Psychological:  Alert and cooperative. Normal mood and affect  See Assessment and Plan under Problem List

## 2014-11-25 NOTE — Patient Instructions (Addendum)
Go to the basement for your hemoccult test today  Phazyme samples given to patient

## 2014-12-01 ENCOUNTER — Telehealth: Payer: Self-pay

## 2014-12-01 NOTE — Telephone Encounter (Signed)
Pt called. Does not want Dr. Everlene Farrier to complete the request for the wheelchair. CB # [phone]

## 2014-12-03 ENCOUNTER — Ambulatory Visit: Payer: Medicare Other | Admitting: Emergency Medicine

## 2014-12-08 ENCOUNTER — Encounter: Payer: Self-pay | Admitting: Emergency Medicine

## 2014-12-08 ENCOUNTER — Ambulatory Visit (INDEPENDENT_AMBULATORY_CARE_PROVIDER_SITE_OTHER): Payer: Medicare Other | Admitting: Emergency Medicine

## 2014-12-08 VITALS — BP 131/60 | HR 75 | Temp 98.7°F | Resp 16 | Wt 110.0 lb

## 2014-12-08 DIAGNOSIS — I10 Essential (primary) hypertension: Secondary | ICD-10-CM | POA: Diagnosis not present

## 2014-12-08 DIAGNOSIS — L298 Other pruritus: Secondary | ICD-10-CM

## 2014-12-08 DIAGNOSIS — H6123 Impacted cerumen, bilateral: Secondary | ICD-10-CM

## 2014-12-08 DIAGNOSIS — N898 Other specified noninflammatory disorders of vagina: Secondary | ICD-10-CM

## 2014-12-08 DIAGNOSIS — M2040 Other hammer toe(s) (acquired), unspecified foot: Secondary | ICD-10-CM

## 2014-12-08 LAB — POCT WET PREP WITH KOH
CLUE CELLS WET PREP PER HPF POC: NEGATIVE
KOH Prep POC: NEGATIVE
RBC Wet Prep HPF POC: NEGATIVE
Trichomonas, UA: NEGATIVE
Yeast Wet Prep HPF POC: NEGATIVE

## 2014-12-08 MED ORDER — TRAMADOL-ACETAMINOPHEN 37.5-325 MG PO TABS: 1.0000 | ORAL_TABLET | Freq: Four times a day (QID) | ORAL | Status: DC | PRN

## 2014-12-08 NOTE — Patient Instructions (Signed)
Return in 3 months for physical and lab work.

## 2014-12-08 NOTE — Progress Notes (Signed)
   Subjective:    Patient ID: [name], female    DOB: [birth year], 80 y.o.   MRN: [id number]  HPI Patient presents for HTN follow up and weight check. Last home systolic reading 128 and unsure of diastolic number. Denies missed doses and side effects from medication. Gets one meal from Me[employer] on Wheels and gets food stamps, however, son eats up food in the fridge. Has a boost daily. Stays active by doing yard work, but has had run ins with neighbors from being in their yards.   Complains of vaginal itching that is not new. Not having any pain or urinary sx. Uses dove soap and has not changed in years. Has not had any sexual activity in 2 years as she no longer has the desire.  Would like a refill Tramadol for her bilat foot pain. Has had hammer toes for 10+ years and has pain daily.   Review of Systems  Constitutional: Negative for fever, diaphoresis, appetite change and unexpected weight change.  HENT: Positive for hearing loss (feels wax in ears).   Respiratory: Negative for shortness of breath.   Cardiovascular: Negative for chest pain and leg swelling.  Gastrointestinal: Negative for nausea, vomiting, abdominal pain, diarrhea and constipation.  Genitourinary: Negative for dysuria, frequency, hematuria, vaginal bleeding, vaginal discharge, difficulty urinating and vaginal pain.  Musculoskeletal: Positive for back pain (not new). Negative for joint swelling and gait problem.       Hammer toes bilat  Neurological: Negative for dizziness and headaches.       Objective:   Physical Exam  Constitutional: She appears well-nourished. No distress.  Blood pressure 131/60, pulse 75, temperature 98.7 F (37.1 C), temperature source Oral, resp. rate 16, weight 110 lb (49.896 kg), SpO2 98 %.  HENT:  Head: Normocephalic and atraumatic.  Right Ear: External ear normal. A foreign body (copious amounts of cerumen) is present.  Left Ear: External ear normal. A foreign body (copious amounts of  cerumen) is present.  Nose: No mucosal edema, rhinorrhea or sinus tenderness.  Mouth/Throat: Uvula is midline and oropharynx is clear and moist.  Eyes: Pupils are equal, round, and reactive to light. Right eye exhibits no discharge. Left eye exhibits no discharge. No scleral icterus.  Neck: Normal range of motion. Neck supple. No thyromegaly present.  Cardiovascular: Normal rate, regular rhythm and normal heart sounds.  Exam reveals no gallop and no friction rub.   No murmur heard. Pulmonary/Chest: Effort normal and breath sounds normal. She has no wheezes. She has no rales.  Abdominal: Soft. Bowel sounds are normal. There is no tenderness.  Genitourinary: There is no rash (hyperpigmentation), tenderness, lesion or injury on the right labia. There is no rash (hyperpigmentation), tenderness, lesion or injury on the left labia.  Lymphadenopathy:    She has no cervical adenopathy.  Neurological: She is alert.  Skin: Skin is warm and dry. No rash noted. She is not diaphoretic. No erythema. No pallor.   Wt Readings from Last 3 Encounters:  12/08/14 110 lb (49.896 kg)  11/25/14 112 lb (50.803 kg)  09/03/14 108 lb 9.6 oz (49.261 kg)       Assessment & Plan:  1. Essential hypertension, benign Well controlled with medication.  2. Hammer toe, unspecified laterality Refilled tramadol.  3. Excessive cerumen Bilateral irrigation.  4. Vaginal itch - Wet prep   Alveta Heimlich PA-C  [facility] 12/08/2014 2:51 PM

## 2014-12-09 ENCOUNTER — Telehealth: Payer: Self-pay

## 2014-12-09 DIAGNOSIS — H9190 Unspecified hearing loss, unspecified ear: Secondary | ICD-10-CM

## 2014-12-09 NOTE — Telephone Encounter (Signed)
Patient requesting Dr Everlene Farrier to refer her to a specialist for her hearing loss. Patients call back number is [phone]

## 2014-12-09 NOTE — Telephone Encounter (Signed)
Please advise 

## 2014-12-10 ENCOUNTER — Other Ambulatory Visit: Payer: Self-pay | Admitting: Emergency Medicine

## 2014-12-10 MED ORDER — TRAMADOL-ACETAMINOPHEN 37.5-325 MG PO TABS: 1.0000 | ORAL_TABLET | Freq: Four times a day (QID) | ORAL | Status: DC | PRN

## 2014-12-10 NOTE — Telephone Encounter (Signed)
Referral made.  Can she get a refill on her tramadol and acetaminophen

## 2014-12-10 NOTE — Telephone Encounter (Signed)
Patient states she needs a refill on Tramadol and Acetaminophen. Also states she wants a referral to see an ENT specialist. Hearing difficulties in her left ear.   [phone]

## 2014-12-10 NOTE — Telephone Encounter (Signed)
Please make appointment for patient to see Dr. at [facility] for hearing evaluation

## 2014-12-23 NOTE — Telephone Encounter (Signed)
Medication sent to [facility] on [address] on 12/10/14

## 2014-12-28 NOTE — Telephone Encounter (Signed)
Patient is requesting labs and meds, cuz it itches down there.   [phone]

## 2014-12-28 NOTE — Telephone Encounter (Signed)
Pt states that she has some vaginal itching at times. She states it is not constant. She has been using an OTC cream- hydrocortisone. She states that this helps but it keeps returning. Pt needs to RTC for evaluation and provide [place] with another sample- she states it is not a pressing matter. She will come in to see Dr. Everlene Farrier in March.

## 2015-01-06 DIAGNOSIS — H9190 Unspecified hearing loss, unspecified ear: Secondary | ICD-10-CM | POA: Diagnosis not present

## 2015-01-06 DIAGNOSIS — H6122 Impacted cerumen, left ear: Secondary | ICD-10-CM | POA: Diagnosis not present

## 2015-01-07 DIAGNOSIS — H4011X2 Primary open-angle glaucoma, moderate stage: Secondary | ICD-10-CM | POA: Diagnosis not present

## 2015-01-07 DIAGNOSIS — H2513 Age-related nuclear cataract, bilateral: Secondary | ICD-10-CM | POA: Diagnosis not present

## 2015-01-07 DIAGNOSIS — H4011X1 Primary open-angle glaucoma, mild stage: Secondary | ICD-10-CM | POA: Diagnosis not present

## 2015-01-07 DIAGNOSIS — H02834 Dermatochalasis of left upper eyelid: Secondary | ICD-10-CM | POA: Diagnosis not present

## 2015-01-07 DIAGNOSIS — H02831 Dermatochalasis of right upper eyelid: Secondary | ICD-10-CM | POA: Diagnosis not present

## 2015-01-11 ENCOUNTER — Other Ambulatory Visit: Payer: Self-pay | Admitting: Emergency Medicine

## 2015-01-12 ENCOUNTER — Telehealth: Payer: Self-pay

## 2015-01-12 NOTE — Telephone Encounter (Signed)
Spoke to pt last urine test was completed. She states she forgot. Pt does not want to see anyone but Dr. Everlene Farrier.

## 2015-01-12 NOTE — Telephone Encounter (Signed)
Pt states we lost her urine sample and would like to know the status Please call [phone]

## 2015-01-13 ENCOUNTER — Other Ambulatory Visit: Payer: Self-pay | Admitting: Emergency Medicine

## 2015-01-19 ENCOUNTER — Telehealth: Payer: Self-pay

## 2015-01-19 NOTE — Telephone Encounter (Signed)
PT wants to know if at her age she should shovel snow.  Says she has always done this, but many people have told her she shouldn't be.  [phone]

## 2015-01-20 NOTE — Telephone Encounter (Signed)
Call [name] and tell her I do not one her out in the cold shoveling snow

## 2015-01-20 NOTE — Telephone Encounter (Signed)
Phone busy x 2

## 2015-01-20 NOTE — Telephone Encounter (Signed)
Patient called again to see if it's ok for her to shovel snow at her age given the number of older people that have been mentioned in the news recently who have died from shoveling snow. Patient wants to know what Dr. Everlene Farrier thinks. Advised patient that she should not shovel snow until she gets a medical opinion. Patient stated that she is going to clean her house and take a walk instead to get some physical activity.   [phone]

## 2015-01-21 NOTE — Telephone Encounter (Signed)
Spoke with pt, advised message from Dr. Daub. Pt understood. 

## 2015-01-26 ENCOUNTER — Other Ambulatory Visit: Payer: Self-pay | Admitting: Emergency Medicine

## 2015-02-04 ENCOUNTER — Telehealth: Payer: Self-pay

## 2015-02-04 NOTE — Telephone Encounter (Signed)
She got some paperwork from [place] and she does not understand it. Please advise at [phone]

## 2015-02-04 NOTE — Telephone Encounter (Signed)
Spoke to pt. Seems like the paperwork was not sent from [place]. It sounds like it was sent from [employer]. Told pt to contact them and see what the letter is about.

## 2015-02-05 ENCOUNTER — Other Ambulatory Visit: Payer: Self-pay | Admitting: Emergency Medicine

## 2015-02-11 ENCOUNTER — Other Ambulatory Visit: Payer: Self-pay | Admitting: Emergency Medicine

## 2015-02-12 ENCOUNTER — Telehealth: Payer: Self-pay

## 2015-02-12 NOTE — Telephone Encounter (Signed)
Faxed

## 2015-02-12 NOTE — Telephone Encounter (Signed)
Pt. Called for a tramadol refill. Please call [phone]

## 2015-02-12 NOTE — Telephone Encounter (Signed)
I believe this is already been taken care of. I did give an okay for Ultracet to be refilled which is tramadol

## 2015-02-15 NOTE — Telephone Encounter (Signed)
Called pt to let her know her Tramadol was sent in. Unable to leave voicemail.

## 2015-02-19 DIAGNOSIS — L508 Other urticaria: Secondary | ICD-10-CM | POA: Diagnosis not present

## 2015-02-24 DIAGNOSIS — L309 Dermatitis, unspecified: Secondary | ICD-10-CM | POA: Diagnosis not present

## 2015-03-04 DIAGNOSIS — Z01419 Encounter for gynecological examination (general) (routine) without abnormal findings: Secondary | ICD-10-CM | POA: Diagnosis not present

## 2015-03-04 DIAGNOSIS — Z113 Encounter for screening for infections with a predominantly sexual mode of transmission: Secondary | ICD-10-CM | POA: Diagnosis not present

## 2015-03-04 DIAGNOSIS — Z1159 Encounter for screening for other viral diseases: Secondary | ICD-10-CM | POA: Diagnosis not present

## 2015-03-04 DIAGNOSIS — Z124 Encounter for screening for malignant neoplasm of cervix: Secondary | ICD-10-CM | POA: Diagnosis not present

## 2015-03-04 DIAGNOSIS — Z114 Encounter for screening for human immunodeficiency virus [HIV]: Secondary | ICD-10-CM | POA: Diagnosis not present

## 2015-03-09 ENCOUNTER — Telehealth: Payer: Self-pay | Admitting: *Deleted

## 2015-03-09 ENCOUNTER — Ambulatory Visit (INDEPENDENT_AMBULATORY_CARE_PROVIDER_SITE_OTHER): Payer: Medicare Other | Admitting: Emergency Medicine

## 2015-03-09 ENCOUNTER — Ambulatory Visit (INDEPENDENT_AMBULATORY_CARE_PROVIDER_SITE_OTHER): Payer: Medicare Other

## 2015-03-09 ENCOUNTER — Encounter: Payer: Self-pay | Admitting: Emergency Medicine

## 2015-03-09 VITALS — BP 136/58 | HR 76 | Temp 98.2°F | Resp 16 | Ht 58.5 in | Wt 102.6 lb

## 2015-03-09 DIAGNOSIS — E785 Hyperlipidemia, unspecified: Secondary | ICD-10-CM

## 2015-03-09 DIAGNOSIS — I10 Essential (primary) hypertension: Secondary | ICD-10-CM

## 2015-03-09 DIAGNOSIS — Z0000 Encounter for general adult medical examination without abnormal findings: Secondary | ICD-10-CM

## 2015-03-09 DIAGNOSIS — R609 Edema, unspecified: Secondary | ICD-10-CM

## 2015-03-09 DIAGNOSIS — R634 Abnormal weight loss: Secondary | ICD-10-CM

## 2015-03-09 DIAGNOSIS — K2270 Barrett's esophagus without dysplasia: Secondary | ICD-10-CM | POA: Diagnosis not present

## 2015-03-09 DIAGNOSIS — M7989 Other specified soft tissue disorders: Secondary | ICD-10-CM

## 2015-03-09 LAB — CBC WITH DIFFERENTIAL/PLATELET
Basophils Absolute: 0 10*3/uL (ref 0.0–0.1)
Basophils Relative: 1 % (ref 0–1)
EOS ABS: 0.2 10*3/uL (ref 0.0–0.7)
EOS PCT: 4 % (ref 0–5)
HEMATOCRIT: 34.9 % — AB (ref 36.0–46.0)
Hemoglobin: 11.6 g/dL — ABNORMAL LOW (ref 12.0–15.0)
LYMPHS PCT: 19 % (ref 12–46)
Lymphs Abs: 0.8 10*3/uL (ref 0.7–4.0)
MCH: 28.4 pg (ref 26.0–34.0)
MCHC: 33.2 g/dL (ref 30.0–36.0)
MCV: 85.3 fL (ref 78.0–100.0)
MONO ABS: 0.3 10*3/uL (ref 0.1–1.0)
MPV: 9.4 fL (ref 8.6–12.4)
Monocytes Relative: 7 % (ref 3–12)
NEUTROS ABS: 3 10*3/uL (ref 1.7–7.7)
Neutrophils Relative %: 69 % (ref 43–77)
Platelets: 361 10*3/uL (ref 150–400)
RBC: 4.09 MIL/uL (ref 3.87–5.11)
RDW: 15.1 % (ref 11.5–15.5)
WBC: 4.3 10*3/uL (ref 4.0–10.5)

## 2015-03-09 LAB — POCT URINALYSIS DIPSTICK
Bilirubin, UA: NEGATIVE
Blood, UA: NEGATIVE
Glucose, UA: NEGATIVE
Ketones, UA: NEGATIVE
NITRITE UA: NEGATIVE
PROTEIN UA: NEGATIVE
Spec Grav, UA: <=1.005
Urobilinogen, UA: 0.2
pH, UA: 5.5

## 2015-03-09 LAB — LIPID PANEL
CHOL/HDL RATIO: 2 ratio
CHOLESTEROL: 188 mg/dL (ref 0–200)
HDL: 96 mg/dL (ref >=46)
LDL Cholesterol: 82 mg/dL (ref 0–99)
Triglycerides: 51 mg/dL (ref <150)
VLDL: 10 mg/dL (ref 0–40)

## 2015-03-09 NOTE — Progress Notes (Signed)
Subjective:  This chart was scribed for [name] Russian, MD by Ladene Artist, ED Scribe. The patient was seen in room 21. Patient's care was started at 2:19 PM.   Patient ID: [name], female    DOB: 1933/01/13, 81 y.o.   MRN: [id number]  Chief Complaint  Patient presents with  . Annual Exam    patient not sure medications she take   HPI HPI Comments: [name] is a 81 y.o. female, with a h/o dementia, CAD, HTN, hyperglycemia, diverticulosis, GERD, Barrett's esophagus, osteoporosis, who presents to the [facility] for an annual exam.   Eczema Pt is receiving treatment for eczema from dermatologist.  Flatulence Pt states that she has been experiencing increased flatulence. She has been treating with Gas-X.   Lower Extremity Swelling Pt has noticed intermittent swelling in bilateral feet over the past few days. Pt takes fluid pill every Tuesday and Saturday.   Back Pain Pt reports that she is still experiencing chronic back pain. She states that she has not been seen by her orthopedist for 3+ years. Pt has seen Dr. Nelva Bush for back pain in the past.   Unexpected Weight Loss Pt has loss approximately 7 lbs over the past 3 months. She was 110 lbs in 11/2014 and today she is 102.6 lbs.   Preventative Maintenance Pt's last mammogram was in 08/2014; normal. Her last colonoscopy was in 04/2008. Last upper endoscopy was in 05/2013 by Dr. Deatra Ina. She has an upcoming dental appointment.  Immunizations All of pt's immunizations are UTD.   Pt states that her son is applying for employment at [employer]. Pt is getting her food from Me[employer] on Wheels.     Past Medical History  Diagnosis Date  . Glaucoma   . Dementia   . Spondylosis   . Foot deformity   . Migraine   . CAD (coronary artery disease)   . Elevated BP   . Diverticulosis   . H/O: GI bleed   . Acute renal insufficiency   . GERD (gastroesophageal reflux disease)   . Barrett's esophagus   . Hyperglycemia     . HSV-2 (herpes simplex virus 2) infection   . Abnormal finding on Pap smear, ASCUS   . Osteoporosis     worsening  . Amputation finger     index  . Hypertension    Current Outpatient Prescriptions on File Prior to Visit  Medication Sig Dispense Refill  . aspirin 81 MG tablet Take 81 mg by mouth daily.      . calcium carbonate (CALCIUM 500) 1250 MG tablet Take 3 tablets by mouth daily.     . Cholecalciferol (VITAMIN D-3 PO) Take 4,500 mg by mouth daily.      Marland Kitchen conjugated estrogens (PREMARIN) vaginal cream Place 1 Applicatorful vaginally once a week.     . desonide (DESOWEN) 0.05 % cream Apply topically 2 (two) times daily.      . ferrous sulfate 325 (65 FE) MG tablet Take 325 mg by mouth daily with breakfast.      . gabapentin (NEURONTIN) 800 MG tablet TAKE 1 TABLE BY MOUTH AT BEDTIME 90 tablet 0  . hydrochlorothiazide (MICROZIDE) 12.5 MG capsule TAKE 1 CAPSULE BY MOUTH EVERY ON TUESDAY AND SATURDAY EVERY WEEK 27 capsule 0  . hydrOXYzine (ATARAX/VISTARIL) 10 MG tablet Take 10-30 mg by mouth every 4 (four) hours as needed for itching.     . lactose free nutrition (BOOST) LIQD Take 237 mLs by mouth  daily.    . metoprolol succinate (TOPROL-XL) 50 MG 24 hr tablet TAKE 1 TABLET BY MOUTH ONCE DAILY 30 tablet 2  . Multiple Vitamins-Minerals (EQL CENTRAL-VITE SELECT PO) Take 1 tablet by mouth daily.     . Multiple Vitamins-Minerals (HAIR/SKIN/NAILS PO) Take by mouth daily.      Marland Kitchen omeprazole (PRILOSEC) 40 MG capsule Take 1 capsule (40 mg total) by mouth daily. 30 capsule 11  . promethazine (PHENERGAN) 25 MG tablet Take 1 tablet (25 mg total) by mouth every 6 (six) hours as needed for nausea or vomiting. 30 tablet 6  . vitamin C (ASCORBIC ACID) 500 MG tablet Take 500 mg by mouth daily.    Marland Kitchen gabapentin (NEURONTIN) 800 MG tablet TAKE 1 TABLET BY MOUTH AT BEDTIME. 90 tablet 0  . hydroxypropyl methylcellulose (ISOPTO TEARS) 2.5 % ophthalmic solution Place 1 drop into both eyes 2 (two) times daily.     Marland Kitchen nystatin (MYCOSTATIN) cream Apply 1 application topically as needed (dry skin).     . traMADol-acetaminophen (ULTRACET) 37.5-325 MG per tablet TAKE 1 TABLET BY MOUTH EVERY 6 HOURS AS NEEDED FOR MODERATE PAIN (Patient not taking: Reported on 03/09/2015) 60 tablet 0  . verapamil (CALAN-SR) 240 MG CR tablet TAKE 1 TABLET BY MOUTH EVERY MORNING 30 tablet 2   No current facility-administered medications on file prior to visit.   No Known Allergies  Review of Systems  Constitutional: Positive for unexpected weight change.  Cardiovascular: Positive for leg swelling.  Musculoskeletal: Positive for back pain.  Skin: Positive for rash.      Objective:   Physical Exam CONSTITUTIONAL: Well developed/well nourished HEAD: Normocephalic/atraumatic EYES: EOMI/PERRL ENMT: Mucous membranes moist, has a broken lower L incisor with upper denture  NECK: supple no meningeal signs SPINE/BACK:entire spine nontender, severe kyphoscoliosis  CV: S1/S2 noted, no murmurs/rubs/gallops noted LUNGS: Lungs are clear to auscultation bilaterally, no apparent distress ABDOMEN: soft, nontender, no rebound or guarding, bowel sounds noted throughout abdomen, large midline scar, no masses GU:no cva tenderness, Breast: no masses NEURO: Pt is awake/alert/appropriate, moves all extremitiesx4.  No facial droop.   EXTREMITIES: pulses normal/equal, full ROM, bilateral varicose veins with trace edema SKIN: warm, color normal PSYCH: no abnormalities of mood noted, alert and oriented to situation   [facility] reading (PRIMARY) by  Dr. Everlene Farrier there is severe kyphoscoliosis. There is a significant hiatal hernia. There is a tortuous aorta. The lung fields appear clear.  Assessment & Plan:  I am very concerned about her weight loss. We'll recheck weight in one month. She was encouraged to continue with Meals on Wheels and eat regularly. I referred her back to Dr. Deatra Ina for his opinion regarding her Barrett's esophagus.I  think her primary  problem is she does not get enough calories in. She states she is unable to cook. I will have the  staff call and see if Adult Protective Services could help in her situation and trying get more food for her. I personally performed the services described in this documentation, which was scribed in my presence. The recorded information has been reviewed and is accurate.

## 2015-03-09 NOTE — Telephone Encounter (Signed)
Re faxed signed authorization to [facility] (Dr Benjie Karvonen) medical records and confirmation page received a t3:12 pm.

## 2015-03-09 NOTE — Telephone Encounter (Signed)
Faxed signed authorization for medical record release for recent lab tests on patient, per Dr Everlene Farrier.

## 2015-03-12 ENCOUNTER — Telehealth: Payer: Self-pay | Admitting: Emergency Medicine

## 2015-03-12 NOTE — Telephone Encounter (Signed)
The sure she eats high-protein sandwiches with cheese on the same range. Be sure she sees me as instructed. She is mildly anemic but otherwise her blood count was normal.

## 2015-03-12 NOTE — Telephone Encounter (Signed)
Patient called about several things. First, patient states that she is trying to eat more but needs a little help. She states that her aid fixes her a large breakfast however in the evenings she is only eating sandwiches. Patient wants to know what to eat with her sandwich to help her gain weight.....  Second, patient states that Dr. Deatra Ina is booked through April and she cannot get in any sooner. Third, patient wants to know the results of her lab tests.  [phone]

## 2015-03-15 NOTE — Telephone Encounter (Signed)
She would like to know why you would like her to gain weight. i explained your message to her. She has been eating a cheese sandwich with potato chips every night.  She is feeling better and has been out in the yard more.

## 2015-03-15 NOTE — Telephone Encounter (Signed)
Please see previous message

## 2015-03-18 ENCOUNTER — Ambulatory Visit: Payer: Medicare Other | Admitting: Gastroenterology

## 2015-03-25 NOTE — Telephone Encounter (Signed)
Dr. Everlene Farrier,   Patient will see the weight loss doctor on April 14th,  She wants to know if she needs to keep the appointment with you on the 12th or wait until after the appointment.   [phone] (H)

## 2015-03-25 NOTE — Telephone Encounter (Signed)
She needs to keep her appt. With me at 104

## 2015-03-25 NOTE — Telephone Encounter (Signed)
Dr Everlene Farrier?

## 2015-03-29 NOTE — Telephone Encounter (Signed)
Spoke with pt, advised her to still come in to see Dr. Everlene Farrier.

## 2015-04-03 ENCOUNTER — Other Ambulatory Visit: Payer: Self-pay | Admitting: Physician Assistant

## 2015-04-06 ENCOUNTER — Ambulatory Visit (INDEPENDENT_AMBULATORY_CARE_PROVIDER_SITE_OTHER): Payer: Medicare Other | Admitting: Emergency Medicine

## 2015-04-06 ENCOUNTER — Encounter: Payer: Self-pay | Admitting: Emergency Medicine

## 2015-04-06 VITALS — BP 160/60 | HR 99 | Temp 98.4°F | Resp 16 | Ht 59.0 in | Wt 104.2 lb

## 2015-04-06 DIAGNOSIS — R634 Abnormal weight loss: Secondary | ICD-10-CM

## 2015-04-06 DIAGNOSIS — R8290 Unspecified abnormal findings in urine: Secondary | ICD-10-CM | POA: Diagnosis not present

## 2015-04-06 DIAGNOSIS — R8299 Other abnormal findings in urine: Secondary | ICD-10-CM | POA: Diagnosis not present

## 2015-04-06 LAB — POCT URINALYSIS DIPSTICK
Bilirubin, UA: NEGATIVE
GLUCOSE UA: NEGATIVE
Ketones, UA: NEGATIVE
Nitrite, UA: POSITIVE
PH UA: 6.5
Protein, UA: NEGATIVE
Spec Grav, UA: 1.01
UROBILINOGEN UA: 0.2

## 2015-04-06 LAB — C-REACTIVE PROTEIN: CRP: <0.5 mg/dL (ref <0.60)

## 2015-04-06 LAB — CBC WITH DIFFERENTIAL/PLATELET
BASOS PCT: 1 % (ref 0–1)
Basophils Absolute: 0.1 10*3/uL (ref 0.0–0.1)
EOS ABS: 0.3 10*3/uL (ref 0.0–0.7)
Eosinophils Relative: 5 % (ref 0–5)
HCT: 34.6 % — ABNORMAL LOW (ref 36.0–46.0)
HEMOGLOBIN: 11.7 g/dL — AB (ref 12.0–15.0)
LYMPHS ABS: 0.7 10*3/uL (ref 0.7–4.0)
Lymphocytes Relative: 14 % (ref 12–46)
MCH: 28.3 pg (ref 26.0–34.0)
MCHC: 33.8 g/dL (ref 30.0–36.0)
MCV: 83.8 fL (ref 78.0–100.0)
MPV: 9.2 fL (ref 8.6–12.4)
Monocytes Absolute: 0.3 10*3/uL (ref 0.1–1.0)
Monocytes Relative: 6 % (ref 3–12)
NEUTROS ABS: 3.9 10*3/uL (ref 1.7–7.7)
NEUTROS PCT: 74 % (ref 43–77)
Platelets: 378 10*3/uL (ref 150–400)
RBC: 4.13 MIL/uL (ref 3.87–5.11)
RDW: 14.3 % (ref 11.5–15.5)
WBC: 5.3 10*3/uL (ref 4.0–10.5)

## 2015-04-06 LAB — COMPLETE METABOLIC PANEL WITH GFR
ALBUMIN: 3.4 g/dL — AB (ref 3.5–5.2)
ALK PHOS: 46 U/L (ref 39–117)
ALT: 11 U/L (ref 0–35)
AST: 15 U/L (ref 0–37)
BUN: 18 mg/dL (ref 6–23)
CALCIUM: 8.9 mg/dL (ref 8.4–10.5)
CHLORIDE: 104 meq/L (ref 96–112)
CO2: 27 mEq/L (ref 19–32)
Creat: 0.81 mg/dL (ref 0.50–1.10)
GFR, EST AFRICAN AMERICAN: 80 mL/min
GFR, Est Non African American: 69 mL/min
GLUCOSE: 91 mg/dL (ref 70–99)
Potassium: 4.2 mEq/L (ref 3.5–5.3)
Sodium: 141 mEq/L (ref 135–145)
Total Bilirubin: 0.4 mg/dL (ref 0.2–1.2)
Total Protein: 6.1 g/dL (ref 6.0–8.3)

## 2015-04-06 LAB — POCT UA - MICROSCOPIC ONLY
CRYSTALS, UR, HPF, POC: NEGATIVE
Casts, Ur, LPF, POC: NEGATIVE
MUCUS UA: NEGATIVE
RBC, urine, microscopic: NEGATIVE
Yeast, UA: NEGATIVE

## 2015-04-06 LAB — TSH: TSH: 1.585 u[IU]/mL (ref 0.350–4.500)

## 2015-04-06 MED ORDER — CEPHALEXIN 500 MG PO CAPS: 500.0000 mg | ORAL_CAPSULE | Freq: Two times a day (BID) | ORAL | Status: DC

## 2015-04-06 NOTE — Progress Notes (Addendum)
Subjective:  This chart was scribed for [name] Queen, MD by Donato Schultz, Medical Scribe. This patient was seen in Room 22 and the patient's care was started at 1:51 PM.   Patient ID: [name], female    DOB: 16-Feb-1933, 81 y.o.   MRN: [id number]  HPI HPI Comments: [name] is a 81 y.o. female who presents to the [facility] for a follow-up visit.  She has been eating once daily and is not complaining of decreased appetite.    She is complaining of a rash on her arms bilaterally and believes it may be an allergic reaction.  She has been using Tiamcinolone cream with mild relief to her symptoms.    She is complaining of itchy eyes and would like to be prescribed something for her symptoms.  She was prescribed dry tears eye drops by Dr. Baldwin [place] but has not used the medication.    She does not have a history of smoking.     Past Medical History  Diagnosis Date  . Glaucoma   . Dementia   . Spondylosis   . Foot deformity   . Migraine   . CAD (coronary artery disease)   . Elevated BP   . Diverticulosis   . H/O: GI bleed   . Acute renal insufficiency   . GERD (gastroesophageal reflux disease)   . Barrett's esophagus   . Hyperglycemia   . HSV-2 (herpes simplex virus 2) infection   . Abnormal finding on Pap smear, ASCUS   . Osteoporosis     worsening  . Amputation finger     index  . Hypertension    Past Surgical History  Procedure Laterality Date  . Partial hysterectomy    . Amputation finger / thumb      secondary to osteomyelitis  . Foot surgery      right  . Upper gastrointestinal endoscopy    . Colonoscopy     Family History  Problem Relation Age of Onset  . Breast cancer Mother   . Heart attack Mother   . Heart disease Father   . Alcohol abuse Son   . Colon cancer Neg Hx   . Stomach cancer Neg Hx   . Rectal cancer Neg Hx    History   Social History  . Marital Status: Single    Spouse Name: N/A  . Number of Children: 1  . Years of  Education: N/A   Occupational History  .     Social History Main Topics  . Smoking status: Never Smoker   . Smokeless tobacco: Never Used  . Alcohol Use: No  . Drug Use: No  . Sexual Activity: Not on file   Other Topics Concern  . Not on file   Social History Narrative   No Known Allergies  Review of Systems  Constitutional: Positive for unexpected weight change.     Objective:  Physical Exam  Constitutional: She is oriented to person, place, and time. She appears well-developed and well-nourished.  HENT:  Head: Normocephalic and atraumatic.  Eyes: EOM are normal.  Neck: Normal range of motion.  Cardiovascular: Normal rate, regular rhythm and normal heart sounds.  Exam reveals no gallop and no friction rub.   No murmur heard. Pulmonary/Chest: Effort normal and breath sounds normal. No respiratory distress. She has no wheezes. She has no rales.  Musculoskeletal: Normal range of motion.  Severe kyphoscoliosis and walks at almost a 90 degree angle due to her back deformity.  Neurological: She is alert and oriented to person, place, and time.  Skin: Skin is warm and dry.  Psychiatric: She has a normal mood and affect. Her behavior is normal.  Nursing note and vitals reviewed.  Results for orders placed or performed in visit on 03/09/15  CBC with Differential/Platelet  Result Value Ref Range   WBC 4.3 4.0 - 10.5 K/uL   RBC 4.09 3.87 - 5.11 MIL/uL   Hemoglobin 11.6 (L) 12.0 - 15.0 g/dL   HCT 34.9 (L) 36.0 - 46.0 %   MCV 85.3 78.0 - 100.0 fL   MCH 28.4 26.0 - 34.0 pg   MCHC 33.2 30.0 - 36.0 g/dL   RDW 15.1 11.5 - 15.5 %   Platelets 361 150 - 400 K/uL   MPV 9.4 8.6 - 12.4 fL   Neutrophils Relative % 69 43 - 77 %   Neutro Abs 3.0 1.7 - 7.7 K/uL   Lymphocytes Relative 19 12 - 46 %   Lymphs Abs 0.8 0.7 - 4.0 K/uL   Monocytes Relative 7 3 - 12 %   Monocytes Absolute 0.3 0.1 - 1.0 K/uL   Eosinophils Relative 4 0 - 5 %   Eosinophils Absolute 0.2 0.0 - 0.7 K/uL    Basophils Relative 1 0 - 1 %   Basophils Absolute 0.0 0.0 - 0.1 K/uL   Smear Review Criteria for review not met   Lipid panel  Result Value Ref Range   Cholesterol 188 0 - 200 mg/dL   Triglycerides 51 <150 mg/dL   HDL 96 >=46 mg/dL   Total CHOL/HDL Ratio 2.0 Ratio   VLDL 10 0 - 40 mg/dL   LDL Cholesterol 82 0 - 99 mg/dL  POCT urinalysis dipstick  Result Value Ref Range   Color, UA yellow    Clarity, UA clear    Glucose, UA neg    Bilirubin, UA neg    Ketones, UA neg    Spec Grav, UA <=1.005    Blood, UA neg    pH, UA 5.5    Protein, UA neg    Urobilinogen, UA 0.2    Nitrite, UA neg    Leukocytes, UA Trace     BP 160/60 mmHg  Pulse 99  Temp(Src) 98.4 F (36.9 C) (Oral)  Resp 16  Ht _0  (1.499 m)  Wt 104 lb 3.2 oz (47.265 kg)  BMI 21.03 kg/m2  SpO2 98% Assessment & Plan:  Will check routine labs regarding her weight loss.  Urine also done.  She does have a history of UTIs. Her urine was definitely abnormal. Urine culture done will treat with cephalexin 500 twice a day recheck in 4-6 weeks.I personally performed the services described in this documentation, which was scribed in my presence. The recorded information has been reviewed and is accurate.

## 2015-04-06 NOTE — Addendum Note (Signed)
Addended by: Wyatt Haste on: 04/06/2015 04:06 PM   Modules accepted: Orders

## 2015-04-07 LAB — SEDIMENTATION RATE: Sed Rate: 7 mm/hr (ref 0–30)

## 2015-04-08 ENCOUNTER — Ambulatory Visit: Payer: Medicare Other | Admitting: Gastroenterology

## 2015-04-08 LAB — URINE CULTURE

## 2015-04-19 ENCOUNTER — Other Ambulatory Visit: Payer: Self-pay | Admitting: Emergency Medicine

## 2015-04-20 ENCOUNTER — Telehealth: Payer: Self-pay

## 2015-04-20 NOTE — Telephone Encounter (Signed)
Called patient to advise RX for tramadol has [facility] [address].

## 2015-04-26 ENCOUNTER — Other Ambulatory Visit: Payer: Self-pay | Admitting: Emergency Medicine

## 2015-05-03 ENCOUNTER — Other Ambulatory Visit: Payer: Self-pay | Admitting: Physician Assistant

## 2015-05-05 ENCOUNTER — Telehealth: Payer: Self-pay | Admitting: *Deleted

## 2015-05-05 NOTE — Telephone Encounter (Signed)
Pt called and will be by to pickup

## 2015-05-05 NOTE — Telephone Encounter (Signed)
Lm with female for pt to call back  We have some glucerna shakes at 104 that Dr. Everlene Farrier would like for her to have.  She can come by anytime before 5 to pick them up.

## 2015-05-06 ENCOUNTER — Telehealth: Payer: Self-pay | Admitting: Emergency Medicine

## 2015-05-06 NOTE — Telephone Encounter (Signed)
Tell patient it is fine for her to try that.

## 2015-05-06 NOTE — Telephone Encounter (Signed)
Is this ok for pt?

## 2015-05-06 NOTE — Telephone Encounter (Signed)
Patient wants a return call from Dr. Everlene Farrier or his nurse. She wants to know if it is ok for to drink Glucerna???? She thinks that's the name of it, states that it is kind of like Ensure.   [phone]

## 2015-05-07 ENCOUNTER — Telehealth: Payer: Self-pay | Admitting: Gastroenterology

## 2015-05-07 NOTE — Telephone Encounter (Signed)
No answer. No voicemail. 

## 2015-05-10 NOTE — Telephone Encounter (Signed)
She is having sluggish bowels. Takes Miralax once in a while. She will try daily until she has daily bowel movements.

## 2015-05-10 NOTE — Telephone Encounter (Signed)
Spoke with pt, advised message from Dr. Daub. Pt understood. 

## 2015-05-30 ENCOUNTER — Telehealth: Payer: Self-pay

## 2015-05-30 ENCOUNTER — Other Ambulatory Visit: Payer: Self-pay | Admitting: Radiology

## 2015-05-30 DIAGNOSIS — M7989 Other specified soft tissue disorders: Secondary | ICD-10-CM

## 2015-05-30 NOTE — Telephone Encounter (Signed)
Referral made 

## 2015-05-30 NOTE — Telephone Encounter (Signed)
Pt wants to let Dr Everlene Farrier know  1. She is cancelling her appt because she cannot walk, her left foot is really swollen. She goes to [facility] on 06/15/15. She is asking Dr Everlene Farrier to call and see if he can get her a sooner appt.  2. They took her aid away because she had bed bugs and the lady that cooks for her was itching too bad. She is concerned that her weight will not continue to improve

## 2015-05-30 NOTE — Telephone Encounter (Signed)
fyi dr Everlene Farrier. Anything you want [place] to do ?

## 2015-05-30 NOTE — Telephone Encounter (Signed)
Put a note the patient needs an urgent referral to [facility] on Monday or Tuesday. See if referrals can call her office and get her in to be seen sooner.

## 2015-05-31 ENCOUNTER — Ambulatory Visit: Payer: Medicare Other | Admitting: Gastroenterology

## 2015-05-31 ENCOUNTER — Encounter (HOSPITAL_COMMUNITY): Payer: Self-pay | Admitting: Emergency Medicine

## 2015-05-31 ENCOUNTER — Telehealth: Payer: Self-pay

## 2015-05-31 ENCOUNTER — Emergency Department (HOSPITAL_BASED_OUTPATIENT_CLINIC_OR_DEPARTMENT_OTHER)
Admit: 2015-05-31 | Discharge: 2015-05-31 | Disposition: A | Discharge status: Home / Self Care | Payer: Medicare Other | Attending: Emergency Medicine | Admitting: Emergency Medicine

## 2015-05-31 ENCOUNTER — Emergency Department (HOSPITAL_COMMUNITY)
Admission: EM | Admit: 2015-05-31 | Discharge: 2015-05-31 | Disposition: A | Discharge status: Home / Self Care | Payer: Medicare Other | Attending: Emergency Medicine | Admitting: Emergency Medicine

## 2015-05-31 ENCOUNTER — Emergency Department (HOSPITAL_COMMUNITY): Payer: Medicare Other

## 2015-05-31 DIAGNOSIS — I10 Essential (primary) hypertension: Secondary | ICD-10-CM | POA: Insufficient documentation

## 2015-05-31 DIAGNOSIS — Z7982 Long term (current) use of aspirin: Secondary | ICD-10-CM | POA: Insufficient documentation

## 2015-05-31 DIAGNOSIS — R21 Rash and other nonspecific skin eruption: Secondary | ICD-10-CM | POA: Diagnosis not present

## 2015-05-31 DIAGNOSIS — Z8739 Personal history of other diseases of the musculoskeletal system and connective tissue: Secondary | ICD-10-CM | POA: Insufficient documentation

## 2015-05-31 DIAGNOSIS — I2510 Atherosclerotic heart disease of native coronary artery without angina pectoris: Secondary | ICD-10-CM | POA: Insufficient documentation

## 2015-05-31 DIAGNOSIS — G43909 Migraine, unspecified, not intractable, without status migrainosus: Secondary | ICD-10-CM | POA: Diagnosis not present

## 2015-05-31 DIAGNOSIS — H409 Unspecified glaucoma: Secondary | ICD-10-CM | POA: Diagnosis not present

## 2015-05-31 DIAGNOSIS — M25475 Effusion, left foot: Secondary | ICD-10-CM | POA: Diagnosis not present

## 2015-05-31 DIAGNOSIS — K219 Gastro-esophageal reflux disease without esophagitis: Secondary | ICD-10-CM | POA: Insufficient documentation

## 2015-05-31 DIAGNOSIS — M2012 Hallux valgus (acquired), left foot: Secondary | ICD-10-CM | POA: Diagnosis not present

## 2015-05-31 DIAGNOSIS — Z8619 Personal history of other infectious and parasitic diseases: Secondary | ICD-10-CM | POA: Diagnosis not present

## 2015-05-31 DIAGNOSIS — M25572 Pain in left ankle and joints of left foot: Secondary | ICD-10-CM | POA: Diagnosis not present

## 2015-05-31 DIAGNOSIS — R2241 Localized swelling, mass and lump, right lower limb: Secondary | ICD-10-CM | POA: Diagnosis not present

## 2015-05-31 DIAGNOSIS — M79609 Pain in unspecified limb: Secondary | ICD-10-CM

## 2015-05-31 DIAGNOSIS — M7989 Other specified soft tissue disorders: Secondary | ICD-10-CM | POA: Diagnosis not present

## 2015-05-31 DIAGNOSIS — M19072 Primary osteoarthritis, left ankle and foot: Secondary | ICD-10-CM | POA: Diagnosis not present

## 2015-05-31 DIAGNOSIS — M79672 Pain in left foot: Secondary | ICD-10-CM | POA: Diagnosis not present

## 2015-05-31 DIAGNOSIS — M898X7 Other specified disorders of bone, ankle and foot: Secondary | ICD-10-CM | POA: Diagnosis not present

## 2015-05-31 DIAGNOSIS — F0390 Unspecified dementia without behavioral disturbance: Secondary | ICD-10-CM | POA: Diagnosis not present

## 2015-05-31 DIAGNOSIS — M216X9 Other acquired deformities of unspecified foot: Secondary | ICD-10-CM | POA: Diagnosis not present

## 2015-05-31 LAB — CBC WITH DIFFERENTIAL/PLATELET
BASOS ABS: 0.1 10*3/uL (ref 0.0–0.1)
BASOS PCT: 1 % (ref 0–1)
EOS PCT: 3 % (ref 0–5)
Eosinophils Absolute: 0.3 10*3/uL (ref 0.0–0.7)
HEMATOCRIT: 36.5 % (ref 36.0–46.0)
HEMOGLOBIN: 11.8 g/dL — AB (ref 12.0–15.0)
LYMPHS ABS: 0.8 10*3/uL (ref 0.7–4.0)
LYMPHS PCT: 9 % — AB (ref 12–46)
MCH: 28.2 pg (ref 26.0–34.0)
MCHC: 32.3 g/dL (ref 30.0–36.0)
MCV: 87.1 fL (ref 78.0–100.0)
MONO ABS: 1 10*3/uL (ref 0.1–1.0)
Monocytes Relative: 10 % (ref 3–12)
Neutro Abs: 7.5 10*3/uL (ref 1.7–7.7)
Neutrophils Relative %: 77 % (ref 43–77)
Platelets: 327 10*3/uL (ref 150–400)
RBC: 4.19 MIL/uL (ref 3.87–5.11)
RDW: 13.4 % (ref 11.5–15.5)
WBC: 9.6 10*3/uL (ref 4.0–10.5)

## 2015-05-31 LAB — BASIC METABOLIC PANEL
ANION GAP: 10 (ref 5–15)
BUN: 23 mg/dL — ABNORMAL HIGH (ref 6–20)
CALCIUM: 9.4 mg/dL (ref 8.9–10.3)
CHLORIDE: 103 mmol/L (ref 101–111)
CO2: 30 mmol/L (ref 22–32)
Creatinine, Ser: 0.88 mg/dL (ref 0.44–1.00)
GFR calc Af Amer: >60 mL/min (ref >60)
GFR calc non Af Amer: >60 mL/min (ref >60)
Glucose, Bld: 74 mg/dL (ref 65–99)
Potassium: 3.8 mmol/L (ref 3.5–5.1)
SODIUM: 143 mmol/L (ref 135–145)

## 2015-05-31 MED ORDER — ACETAMINOPHEN 325 MG PO TABS
650.0000 mg | ORAL_TABLET | Freq: Once | ORAL | Status: AC
  Administered 2015-05-31: 650 mg via ORAL
  Filled 2015-05-31: qty 2

## 2015-05-31 NOTE — ED Notes (Addendum)
Per ems pt from home co left foot pain, per ems left foot minor edema and some redness and heat. Denies fall or injury. Per ems pt ambulated to ambulance without obvious distress.

## 2015-05-31 NOTE — ED Notes (Signed)
Bed: OI51 Expected date:  Expected time:  Means of arrival:  Comments: EMS F left foot pain, ambulatory

## 2015-05-31 NOTE — ED Provider Notes (Signed)
CSN: [id number]     Arrival date & time    History   First MD Initiated Contact with Patient 05/31/15 [phone]     Chief Complaint  Patient presents with  . left foot pain      (Consider location/radiation/quality/duration/timing/severity/associated sxs/prior Treatment) HPI  81 year old female presents with left foot pain and swelling for the past 3 days. Patient does not remember an injury but feels like she needs an x-ray she's concerned that is broken. Describes diffuse pain in her foot and has a hard time ambulating due to the pain. Patient denies a fevers. She has noticed some redness but is present on both legs. She does walk outside but doesn't know if she came into contact with anything. Red areas are itchy. Denies any chest pain or shortness of breath. Takes her chronic pain medicine but otherwise has not taken anything new for the new foot pain.  Past Medical History  Diagnosis Date  . Glaucoma   . Dementia   . Spondylosis   . Foot deformity   . Migraine   . CAD (coronary artery disease)   . Elevated BP   . Diverticulosis   . H/O: GI bleed   . Acute renal insufficiency   . GERD (gastroesophageal reflux disease)   . Barrett's esophagus   . Hyperglycemia   . HSV-2 (herpes simplex virus 2) infection   . Abnormal finding on Pap smear, ASCUS   . Osteoporosis     worsening  . Amputation finger     index  . Hypertension    Past Surgical History  Procedure Laterality Date  . Partial hysterectomy    . Amputation finger / thumb      secondary to osteomyelitis  . Foot surgery      right  . Upper gastrointestinal endoscopy    . Colonoscopy     Family History  Problem Relation Age of Onset  . Breast cancer Mother   . Heart attack Mother   . Heart disease Father   . Alcohol abuse Son   . Colon cancer Neg Hx   . Stomach cancer Neg Hx   . Rectal cancer Neg Hx    History  Substance Use Topics  . Smoking status: Never Smoker   . Smokeless tobacco: Never Used  .  Alcohol Use: No   OB History    No data available     Review of Systems  Constitutional: Negative for fever.  Respiratory: Negative for shortness of breath.   Cardiovascular: Negative for chest pain.  Musculoskeletal: Positive for joint swelling and arthralgias.  Skin: Positive for rash.  All other systems reviewed and are negative.     Allergies  Review of patient's allergies indicates no known allergies.  Home Medications   Prior to Admission medications   Medication Sig Start Date End Date Taking? Authorizing Provider  aspirin 81 MG tablet Take 81 mg by mouth daily.      Historical Provider, MD  calcium carbonate (CALCIUM 500) 1250 MG tablet Take 3 tablets by mouth daily.     Historical Provider, MD  cephALEXin (KEFLEX) 500 MG capsule Take 1 capsule (500 mg total) by mouth 2 (two) times daily. 04/06/15   [name], MD  Cholecalciferol (VITAMIN D-3 PO) Take 4,500 mg by mouth daily.      Historical Provider, MD  conjugated estrogens (PREMARIN) vaginal cream Place 1 Applicatorful vaginally once a week.     Historical Provider, MD  desonide (DESOWEN) 0.05 % cream  Apply topically 2 (two) times daily.      Historical Provider, MD  ferrous sulfate 325 (65 FE) MG tablet Take 325 mg by mouth daily with breakfast.      Historical Provider, MD  gabapentin (NEURONTIN) 800 MG tablet TAKE 1 TABLE BY MOUTH AT BEDTIME 10/26/14   Chelle Jeffery, PA-C  gabapentin (NEURONTIN) 800 MG tablet TAKE 1 TABLET BY MOUTH AT BEDTIME 05/03/15   [name], MD  hydrochlorothiazide (MICROZIDE) 12.5 MG capsule take 1 capsule by mouth EVERY TUESDAY AND SATURDAY 04/27/15   [name], MD  hydroxypropyl methylcellulose (ISOPTO TEARS) 2.5 % ophthalmic solution Place 1 drop into both eyes 2 (two) times daily.    Historical Provider, MD  hydrOXYzine (ATARAX/VISTARIL) 10 MG tablet Take 10-30 mg by mouth every 4 (four) hours as needed for itching.     Historical Provider, MD  lactose free nutrition (BOOST) LIQD  Take 237 mLs by mouth daily.    Historical Provider, MD  metoprolol succinate (TOPROL-XL) 50 MG 24 hr tablet take 1 tablet by mouth once daily 05/03/15   [name], MD  Multiple Vitamins-Minerals (EQL CENTRAL-VITE SELECT PO) Take 1 tablet by mouth daily.     Historical Provider, MD  Multiple Vitamins-Minerals (HAIR/SKIN/NAILS PO) Take by mouth daily.      Historical Provider, MD  nystatin (MYCOSTATIN) cream Apply 1 application topically as needed (dry skin).     Historical Provider, MD  omeprazole (PRILOSEC) 40 MG capsule Take 1 capsule (40 mg total) by mouth daily. 11/03/14   [name], MD  promethazine (PHENERGAN) 25 MG tablet Take 1 tablet (25 mg total) by mouth every 6 (six) hours as needed for nausea or vomiting. 07/31/14   Laban Emperor Zehr, PA-C  traMADol-acetaminophen (ULTRACET) 37.5-325 MG per tablet TAKE 1 TABLET BY MOUTH EVERY 6 HOURS AS NEEDED FOR MODERATE PAIN 04/20/15   [name], MD  verapamil (CALAN-SR) 240 MG CR tablet take 1 tablet by mouth every morning 05/03/15   [name], MD  vitamin C (ASCORBIC ACID) 500 MG tablet Take 500 mg by mouth daily.    Historical Provider, MD   BP 127/65 mmHg  Pulse 97  Temp(Src) 98.7 F (37.1 C) (Oral)  Resp 18  SpO2 96% Physical Exam  Constitutional: She is oriented to person, place, and time. She appears well-developed and well-nourished.  HENT:  Head: Normocephalic and atraumatic.  Right Ear: External ear normal.  Left Ear: External ear normal.  Nose: Nose normal.  Eyes: Right eye exhibits no discharge. Left eye exhibits no discharge.  Cardiovascular: Normal rate and intact distal pulses.   Pulses:      Dorsalis pedis pulses are 2+ on the left side.  Pulmonary/Chest: Effort normal.  Abdominal: She exhibits no distension.  Musculoskeletal:       Left ankle: She exhibits swelling. She exhibits normal range of motion.       Left lower leg: She exhibits swelling (mild pitting edema). She exhibits no tenderness.       Left foot:  There is tenderness, swelling and deformity (chronic toe deformity).  Left leg slightly more swollen than right. Both have mild pitting edema. Left foot with diffuse pitting edema/tenderness. Splotchy flat red rash to left medial and posterior calf as well as similar distribution on right leg. No warmth or area of cellulitis. Rash appears like a contact dermatitis.  Neurological: She is alert and oriented to person, place, and time.  Skin: Skin is warm and  dry.  Nursing note and vitals reviewed.   ED Course  Procedures (including critical care time) Labs Review Labs Reviewed  BASIC METABOLIC PANEL - Abnormal; Notable for the following:    BUN 23 (*)    All other components within normal limits  CBC WITH DIFFERENTIAL/PLATELET - Abnormal; Notable for the following:    Hemoglobin 11.8 (*)    Lymphocytes Relative 9 (*)    All other components within normal limits    Imaging Review Dg Ankle Complete Left  05/31/2015   CLINICAL DATA:  Generalized left foot and ankle pain with soft tissue swelling for 3 days. No known injury.  EXAM: LEFT ANKLE COMPLETE - 3+ VIEW  COMPARISON:  None.  FINDINGS: Soft tissue swelling diffusely, maximal about the ankle. There is no underlying fracture, bone erosion, or joint effusion. No subcutaneous gas or opaque foreign body.  Diffuse arterial calcification. Osteopenia. Midfoot arthritis and hallux valgus with cross toe deformity as seen on contemporaneous foot radiography.  IMPRESSION: Soft tissue swelling without acute bony abnormality.   Electronically Signed   By: Monte Fantasia M.D.   On: 05/31/2015 08:09   Dg Foot Complete Left  05/31/2015   CLINICAL DATA:  Left foot pain and swelling.  EXAM: LEFT FOOT - COMPLETE 3+ VIEW  COMPARISON:  None.  FINDINGS: Bones of the left foot are osteopenic. No acute fracture or dislocation is identified. There is a significant hallux valgus deformity. Probable hammertoe deformity of the second toe. No bony lesions or destruction.  Soft tissues are unremarkable. Vascular calcifications are seen. Lateral view shows degenerative changes involving the tarsal bones.  IMPRESSION: Osteopenia and hallux valgus as well as second hammertoe deformity. Degenerative changes of the tarsal bones.   Electronically Signed   By: Aletta Edouard M.D.   On: 05/31/2015 08:09   *PRELIMINARY RESULTS* Vascular Ultrasound Left lower extremity venous duplex has been completed. Preliminary findings: negative for DVT  Landry Mellow, RDMS, RVT 05/31/2015, 9:32 AM   EKG Interpretation None      MDM   Final diagnoses:  Swelling of foot joint, left    It is unclear why patient is having left foot pain but there is no sign of an acute infection and her lower extremity DVT ultrasound is negative. X-rays are unremarkable. She does not remember any trauma. The swelling is mild and she has normal pulses and good blood flow distally. Lab work is unremarkable. She does appear to have a contact dermatitis, will recommend over-the-counter creams although I do not think this is related. Stable for discharge to follow up with PCP.    Sherwood Gambler, MD 05/31/15 [phone]

## 2015-05-31 NOTE — Progress Notes (Signed)
*  PRELIMINARY RESULTS* Vascular Ultrasound Left lower extremity venous duplex has been completed.  Preliminary findings: negative for DVT  Landry Mellow, RDMS, RVT  05/31/2015, 9:32 AM

## 2015-05-31 NOTE — ED Notes (Signed)
Pt reports that her ride will be here around 3 pm and she is unable to afford a taxi fees. This nurse called social worker to check if it is possible to offer a taxi voucher for this pt. Pt is waiting in main lobby for social worker.

## 2015-05-31 NOTE — Telephone Encounter (Signed)
Daub - Pt wants to know if you want her to go to see Dr. Deatra Ina before she reschedules to see you. [phone]

## 2015-05-31 NOTE — Discharge Instructions (Signed)
Edema °Edema is an abnormal buildup of fluids in your body tissues. Edema is somewhat dependent on gravity to pull the fluid to the lowest place in your body. That makes the condition more common in the legs and thighs (lower extremities). Painless swelling of the feet and ankles is common and becomes more likely as you get older. It is also common in looser tissues, like around your eyes.  °When the affected area is squeezed, the fluid may move out of that spot and leave a dent for a few moments. This dent is called pitting.  °CAUSES  °There are many possible causes of edema. Eating too much salt and being on your feet or sitting for a long time can cause edema in your legs and ankles. Hot weather may make edema worse. Common medical causes of edema include: °· Heart failure. °· Liver disease. °· Kidney disease. °· Weak blood vessels in your legs. °· Cancer. °· An injury. °· Pregnancy. °· Some medications. °· Obesity.  °SYMPTOMS  °Edema is usually painless. Your skin may look swollen or shiny.  °DIAGNOSIS  °Your health care provider may be able to diagnose edema by asking about your medical history and doing a physical exam. You may need to have tests such as X-rays, an electrocardiogram, or blood tests to check for medical conditions that may cause edema.  °TREATMENT  °Edema treatment depends on the cause. If you have heart, liver, or kidney disease, you need the treatment appropriate for these conditions. General treatment may include: °· Elevation of the affected body part above the level of your heart. °· Compression of the affected body part. Pressure from elastic bandages or support stockings squeezes the tissues and forces fluid back into the blood vessels. This keeps fluid from entering the tissues. °· Restriction of fluid and salt intake. °· Use of a water pill (diuretic). These medications are appropriate only for some types of edema. They pull fluid out of your body and make you urinate more often. This  gets rid of fluid and reduces swelling, but diuretics can have side effects. Only use diuretics as directed by your health care provider. °HOME CARE INSTRUCTIONS  °· Keep the affected body part above the level of your heart when you are lying down.   °· Do not sit still or stand for prolonged periods.   °· Do not put anything directly under your knees when lying down. °· Do not wear constricting clothing or garters on your upper legs.   °· Exercise your legs to work the fluid back into your blood vessels. This may help the swelling go down.   °· Wear elastic bandages or support stockings to reduce ankle swelling as directed by your health care provider.   °· Eat a low-salt diet to reduce fluid if your health care provider recommends it.   °· Only take medicines as directed by your health care provider.  °SEEK MEDICAL CARE IF:  °· Your edema is not responding to treatment. °· You have heart, liver, or kidney disease and notice symptoms of edema. °· You have edema in your legs that does not improve after elevating them.   °· You have sudden and unexplained weight gain. °SEEK IMMEDIATE MEDICAL CARE IF:  °· You develop shortness of breath or chest pain.   °· You cannot breathe when you lie down. °· You develop pain, redness, or warmth in the swollen areas.   °· You have heart, liver, or kidney disease and suddenly get edema. °· You have a fever and your symptoms suddenly get worse. °MAKE SURE YOU:  °·   Understand these instructions. °· Will watch your condition. °· Will get help right away if you are not doing well or get worse. °Document Released: 12/11/2005 Document Revised: 04/27/2014 Document Reviewed: 10/03/2013 °ExitCare® Patient Information ©2015 [employer]. This information is not intended to replace advice given to you by your health care provider. Make sure you discuss any questions you have with your health care provider. ° °Peripheral Edema °You have swelling in your legs (peripheral edema). This swelling  is due to excess accumulation of salt and water in your body. Edema may be a sign of heart, kidney or liver disease, or a side effect of a medication. It may also be due to problems in the leg veins. Elevating your legs and using special support stockings may be very helpful, if the cause of the swelling is due to poor venous circulation. Avoid long periods of standing, whatever the cause. °Treatment of edema depends on identifying the cause. Chips, pretzels, pickles and other salty foods should be avoided. Restricting salt in your diet is almost always needed. Water pills (diuretics) are often used to remove the excess salt and water from your body via urine. These medicines prevent the kidney from reabsorbing sodium. This increases urine flow. °Diuretic treatment may also result in lowering of potassium levels in your body. Potassium supplements may be needed if you have to use diuretics daily. Daily weights can help you keep track of your progress in clearing your edema. You should call your caregiver for follow up care as recommended. °SEEK IMMEDIATE MEDICAL CARE IF:  °· You have increased swelling, pain, redness, or heat in your legs. °· You develop shortness of breath, especially when lying down. °· You develop chest or abdominal pain, weakness, or fainting. °· You have a fever. °Document Released: 01/18/2005 Document Revised: 03/04/2012 Document Reviewed: 12/29/2009 °ExitCare® Patient Information ©2015 [employer]. This information is not intended to replace advice given to you by your health care provider. Make sure you discuss any questions you have with your health care provider. ° °

## 2015-06-01 NOTE — Telephone Encounter (Signed)
Please advise 

## 2015-06-01 NOTE — Telephone Encounter (Signed)
I think she should go ahead and see Dr. Deatra Ina and get his opinion about her anemia and weight loss

## 2015-06-02 ENCOUNTER — Telehealth: Payer: Self-pay

## 2015-06-02 NOTE — Telephone Encounter (Signed)
DAUB - Pt called back and is not longer concerned about Dr. Deatra Ina.  She says her foot is all swollen and wants to know if she should come in before her foot appointment.  [phone]

## 2015-06-02 NOTE — Telephone Encounter (Signed)
Pt is checking on the status on this previous message. Please advise [phone]

## 2015-06-02 NOTE — Telephone Encounter (Signed)
Patient has already  been to the emergency room with her foot. If her foot is still bothering her I will be in the office on Friday from one until 7 and be happy to see her

## 2015-06-03 ENCOUNTER — Ambulatory Visit: Payer: Medicare Other | Admitting: Emergency Medicine

## 2015-06-04 ENCOUNTER — Ambulatory Visit (INDEPENDENT_AMBULATORY_CARE_PROVIDER_SITE_OTHER): Payer: Medicare Other | Admitting: Emergency Medicine

## 2015-06-04 VITALS — BP 126/70 | HR 76 | Temp 97.9°F | Resp 20 | Wt 106.5 lb

## 2015-06-04 DIAGNOSIS — M25572 Pain in left ankle and joints of left foot: Secondary | ICD-10-CM

## 2015-06-04 DIAGNOSIS — R21 Rash and other nonspecific skin eruption: Secondary | ICD-10-CM | POA: Diagnosis not present

## 2015-06-04 DIAGNOSIS — M25579 Pain in unspecified ankle and joints of unspecified foot: Secondary | ICD-10-CM | POA: Diagnosis not present

## 2015-06-04 LAB — POCT CBC
GRANULOCYTE PERCENT: 72 % (ref 37–80)
HCT, POC: 37.7 % (ref 37.7–47.9)
Hemoglobin: 11.9 g/dL — AB (ref 12.2–16.2)
Lymph, poc: 1.1 (ref 0.6–3.4)
MCH, POC: 27.4 pg (ref 27–31.2)
MCHC: 31.7 g/dL — AB (ref 31.8–35.4)
MCV: 86.5 fL (ref 80–97)
MID (cbc): 0.2 (ref 0–0.9)
MPV: 7.2 fL (ref 0–99.8)
POC GRANULOCYTE: 3.5 (ref 2–6.9)
POC LYMPH PERCENT: 23 %L (ref 10–50)
POC MID %: 5 %M (ref 0–12)
Platelet Count, POC: 398 10*3/uL (ref 142–424)
RBC: 4.36 M/uL (ref 4.04–5.48)
RDW, POC: 14.4 %
WBC: 4.8 10*3/uL (ref 4.6–10.2)

## 2015-06-04 LAB — POCT SEDIMENTATION RATE: POCT SED RATE: 36 mm/hr — AB (ref 0–22)

## 2015-06-04 MED ORDER — CEPHALEXIN 500 MG PO CAPS: 500.0000 mg | ORAL_CAPSULE | Freq: Two times a day (BID) | ORAL | Status: DC

## 2015-06-04 NOTE — Progress Notes (Addendum)
   Subjective:    Patient ID: [name], female    DOB: [birth year], 81 y.o.   MRN: [id number] This chart was scribed for Arlyss Queen, MD by Zola Button, Medical Scribe. This patient was seen in room 14 and the patient's care was started at 3:36 PM.   HPI HPI Comments: [name] is a 81 y.o. female with a hx of foot deformity and osteoporosis who presents to the [facility] complaining of left foot pain with swelling and redness that started 1 week ago. Patient was seen in the ED 4 days ago for this. XR imaging and lower extremity DVT US were both negative at that time. She is unsure if she injured her foot or not.  Patient also reports having a rash on her left leg.  Review of Systems  Musculoskeletal: Positive for joint swelling and arthralgias.  Skin: Positive for color change and rash.       Objective:   Physical Exam CONSTITUTIONAL: Well developed/well nourished HEAD: Normocephalic/atraumatic EYES: EOM/PERRL ENMT: Mucous membranes moist NECK: supple no meningeal signs SPINE: entire spine nontender CV: S1/S2 noted, no murmurs/rubs/gallops noted LUNGS: Lungs are clear to auscultation bilaterally, no apparent distress ABDOMEN: soft, nontender, no rebound or guarding GU: no cva tenderness NEURO: Pt is awake/alert, moves all extremitiesx4 EXTREMITIES: Left foot from mid foot down with significant redness and swelling. This area is tender to touch. Ankle is normal. SKIN: Patchy 0.5-1 cm macular rash on the inside of her left leg. PSYCH: no abnormalities of mood noted Results for orders placed or performed in visit on 06/04/15  POCT CBC  Result Value Ref Range   WBC 4.8 4.6 - 10.2 K/uL   Lymph, poc 1.1 0.6 - 3.4   POC LYMPH PERCENT 23.0 10 - 50 %L   MID (cbc) 0.2 0 - 0.9   POC MID % 5.0 0 - 12 %M   POC Granulocyte 3.5 2 - 6.9   Granulocyte percent 72.0 37 - 80 %G   RBC 4.36 4.04 - 5.48 M/uL   Hemoglobin 11.9 (A) 12.2 - 16.2 g/dL   HCT, POC 37.7 37.7 -  47.9 %   MCV 86.5 80 - 97 fL   MCH, POC 27.4 27 - 31.2 pg   MCHC 31.7 (A) 31.8 - 35.4 g/dL   RDW, POC 14.4 %   Platelet Count, POC 398 142 - 424 K/uL   MPV 7.2 0 - 99.8 fL       Assessment & Plan:  Patient [name] has an appointment to see the podiatrist. I did not see any areas where the foot had any cracks or breakdown. The area of tenderness and swelling is definitely warm. Her white count is normal. I do think we should cover her for cellulitis. She had a Doppler study and x-rays done when she was seen in the emergency room. She does have a history of a necrotizing fasciitis of her hand which caused her to lose a finger.I personally performed the services described in this documentation, which was scribed in my presence. The recorded information has been reviewed and is accurate.  Nena Jordan, MD

## 2015-06-04 NOTE — Telephone Encounter (Signed)
Spoke with pt;s son, advised me pt is on the way.

## 2015-06-05 LAB — URIC ACID: URIC ACID, SERUM: 3.7 mg/dL (ref 2.4–7.0)

## 2015-06-05 LAB — RHEUMATOID FACTOR: Rhuematoid fact SerPl-aCnc: <10 IU/mL (ref <=14)

## 2015-06-07 ENCOUNTER — Telehealth: Payer: Self-pay

## 2015-06-07 LAB — ANA: ANA: NEGATIVE

## 2015-06-07 NOTE — Telephone Encounter (Signed)
Yes patient needs at least to boost a day

## 2015-06-07 NOTE — Telephone Encounter (Signed)
Pt asking if she needs to still take her two boost a day with antibiotic,will it interfere?? Doe she need to come back and see Dr  Everlene Farrier before foot dr appt???   Best phone for pt is [phone]

## 2015-06-08 ENCOUNTER — Ambulatory Visit: Payer: Medicare Other | Admitting: Emergency Medicine

## 2015-06-08 NOTE — Telephone Encounter (Signed)
Called pt to discuss. Unable to leave message.

## 2015-06-09 NOTE — Telephone Encounter (Signed)
Spoke with pt, advised her the message from Dr. Everlene Farrier. Pt understood.

## 2015-06-11 ENCOUNTER — Telehealth: Payer: Self-pay | Admitting: Emergency Medicine

## 2015-06-11 NOTE — Telephone Encounter (Signed)
Spoke with pt, she wants to know if she needs more ABX for her foot. She is still having swelling in her foot, She is not eating well but she is drinking 2 Boost a day. She states her stomach hurts and wants to know what to take to help. Please advise.

## 2015-06-11 NOTE — Telephone Encounter (Signed)
Patient would like a call back advising if she should continue to take an antibiotic for her foot. Please call! [phone]

## 2015-06-11 NOTE — Telephone Encounter (Signed)
Patient states that she has completed her antibiotic. She wants to know if she needs another refill or can she stop taking it? She states that bottle says take 14 times.   [phone]

## 2015-06-12 NOTE — Telephone Encounter (Signed)
Patient called her foot is better will follow-up with the podiatrist on Wednesday

## 2015-06-14 ENCOUNTER — Telehealth: Payer: Self-pay

## 2015-06-14 NOTE — Telephone Encounter (Signed)
Pt states she have picked up a prescription for antibiotics and wanted to know if she should take them now, have already taken one round but doesn't know about this round Please call [phone]

## 2015-06-15 ENCOUNTER — Ambulatory Visit: Payer: Medicare Other | Admitting: Podiatry

## 2015-06-15 NOTE — Telephone Encounter (Signed)
Dr. Daub please advise. 

## 2015-06-15 NOTE — Telephone Encounter (Signed)
Spoke with pt, advised message from Dr. Everlene Farrier. Pt understood. She states her foot looks good.

## 2015-06-15 NOTE — Telephone Encounter (Signed)
Tell her to hold onto the medication until she sees the podiatrist. If she is having no pain redness or swelling she does not have to take the medicine

## 2015-06-16 ENCOUNTER — Telehealth: Payer: Self-pay

## 2015-06-16 ENCOUNTER — Encounter: Payer: Self-pay | Admitting: Podiatry

## 2015-06-16 ENCOUNTER — Ambulatory Visit: Payer: Medicare Other

## 2015-06-16 ENCOUNTER — Ambulatory Visit (INDEPENDENT_AMBULATORY_CARE_PROVIDER_SITE_OTHER): Payer: Medicare Other | Admitting: Podiatry

## 2015-06-16 VITALS — BP 121/76 | HR 74 | Resp 12

## 2015-06-16 DIAGNOSIS — M79676 Pain in unspecified toe(s): Secondary | ICD-10-CM

## 2015-06-16 DIAGNOSIS — R609 Edema, unspecified: Secondary | ICD-10-CM | POA: Diagnosis not present

## 2015-06-16 DIAGNOSIS — R52 Pain, unspecified: Secondary | ICD-10-CM

## 2015-06-16 DIAGNOSIS — B351 Tinea unguium: Secondary | ICD-10-CM | POA: Diagnosis not present

## 2015-06-16 NOTE — Progress Notes (Signed)
   Subjective:    Patient ID: [name], female    DOB: 1933/01/31, 81 y.o.   MRN: [id number]  HPI  N-SWOLLEN, SORE L-LT FOOT D-1 MONTH O-SLOWLY C-SAME A-PRESSURE T-NONE  Patient relates recent history of swelling left foot evaluated at urgent care. She currently describes taking cephalexin for the problem she presented with a history of rash and swelling on 06/03/2014 [facility]. At that time cephalexin 500 mg was prescribed. The diagnosis was pain in joint ankle left foot primary with rash nonspecific eruption. Patient also had lower extremity arterial Doppler on on 05/31/2015 which was negative for DVT left. She has a diagnosis of edema  X-ray examination report 05/31/2015 left foot without any acute findings noted X-ray left ankle 05/31/2015 soft tissue swelling without acute bony abnormality  ALSO, PT REQUESTING FOR TOENAILS DEBRIDEMENT.   Review of Systems  Cardiovascular: Positive for leg swelling.  All other systems reviewed and are negative.      Objective:   Physical Exam  Patient appears responsive and able to answer questions and provides paper copies of recent encounters at urgent care and primary care  Vascular: DP and PT pulses 2/4 bilaterally Capillary reflex immediate bilaterally Peripheral edema ankle left/foot No left or right calf pain or calf edema  Neurological: Ankle reflex equal reactive bilaterally Vibratory sensation nonreactive bilaterally Sensation to 10 g monofilament wire intact 5/5 bilaterally  Dermatological: No open skin lesions noted bilaterally The toenails are elongated, brittle 6-10  Musculoskeletal: HAV deformities bilaterally Overlapping second left toe      Assessment & Plan:   Assessment: Peripheral edema left lower extremity without DVT Arterial status appears to be adequate bilaterally Mycotic toenails with symptoms  Plan: Advised patient today that her edema is from undetermined cause and at this time I  recommended a 8-15 mmHg knee length compression hose and wrote a prescription for [facility] for the indication of edema  Nails 10 are debrided without any bleeding  Reappoint at patient's request

## 2015-06-16 NOTE — Telephone Encounter (Signed)
Dr. Everlene Farrier I spoke with pt yesterday (20 minutes) and she stated her foot was fine and I advised her to hold off on the ABX. Im confused now.

## 2015-06-16 NOTE — Patient Instructions (Signed)
Light grade knee length compression hose to treat swelling and left ankle and foot  Edema Edema is an abnormal buildup of fluids in your bodytissues. Edema is somewhatdependent on gravity to pull the fluid to the lowest place in your body. That makes the condition more common in the legs and thighs (lower extremities). Painless swelling of the feet and ankles is common and becomes more likely as you get older. It is also common in looser tissues, like around your eyes.  When the affected area is squeezed, the fluid may move out of that spot and leave a dent for a few moments. This dent is called pitting.  CAUSES  There are many possible causes of edema. Eating too much salt and being on your feet or sitting for a long time can cause edema in your legs and ankles. Hot weather may make edema worse. Common medical causes of edema include:  Heart failure.  Liver disease.  Kidney disease.  Weak blood vessels in your legs.  Cancer.  An injury.  Pregnancy.  Some medications.  Obesity. SYMPTOMS  Edema is usually painless.Your skin may look swollen or shiny.  DIAGNOSIS  Your health care provider may be able to diagnose edema by asking about your medical history and doing a physical exam. You may need to have tests such as X-rays, an electrocardiogram, or blood tests to check for medical conditions that may cause edema.  TREATMENT  Edema treatment depends on the cause. If you have heart, liver, or kidney disease, you need the treatment appropriate for these conditions. General treatment may include:  Elevation of the affected body part above the level of your heart.  Compression of the affected body part. Pressure from elastic bandages or support stockings squeezes the tissues and forces fluid back into the blood vessels. This keeps fluid from entering the tissues.  Restriction of fluid and salt intake.  Use of a water pill (diuretic). These medications are appropriate only for some  types of edema. They pull fluid out of your body and make you urinate more often. This gets rid of fluid and reduces swelling, but diuretics can have side effects. Only use diuretics as directed by your health care provider. HOME CARE INSTRUCTIONS   Keep the affected body part above the level of your heart when you are lying down.   Do not sit still or stand for prolonged periods.   Do not put anything directly under your knees when lying down.  Do not wear constricting clothing or garters on your upper legs.   Exercise your legs to work the fluid back into your blood vessels. This may help the swelling go down.   Wear elastic bandages or support stockings to reduce ankle swelling as directed by your health care provider.   Eat a low-salt diet to reduce fluid if your health care provider recommends it.   Only take medicines as directed by your health care provider. SEEK MEDICAL CARE IF:   Your edema is not responding to treatment.  You have heart, liver, or kidney disease and notice symptoms of edema.  You have edema in your legs that does not improve after elevating them.   You have sudden and unexplained weight gain. SEEK IMMEDIATE MEDICAL CARE IF:   You develop shortness of breath or chest pain.   You cannot breathe when you lie down.  You develop pain, redness, or warmth in the swollen areas.   You have heart, liver, or kidney disease and suddenly get  edema.  You have a fever and your symptoms suddenly get worse. MAKE SURE YOU:   Understand these instructions.  Will watch your condition.  Will get help right away if you are not doing well or get worse. Document Released: 12/11/2005 Document Revised: 04/27/2014 Document Reviewed: 10/03/2013 [facility] Patient Information 2015 [place], [place]. This information is not intended to replace advice given to you by your health care provider. Make sure you discuss any questions you have with your health care  provider.

## 2015-06-16 NOTE — Telephone Encounter (Signed)
[name] called wanting Dr. Everlene Farrier to know her foot is a little swollen. She wants to know if she should start taking her new bottle of antibiotic before her she visits her podiatrist today. Please advise at  [phone] There is a message from 6/20 you should check, I couldn't get into it.

## 2015-06-17 ENCOUNTER — Telehealth: Payer: Self-pay | Admitting: *Deleted

## 2015-06-17 NOTE — Telephone Encounter (Addendum)
Pt states she'd like to know what was going on with her right foot, Dr. Amalia Hailey just scraped it.  I reviewed pt's OV notes and the left foot was treated for edema.  I spoke with pt and she said she has on her new hose and wanted to know what to put on her right heel, she throw away the shoes that may have caused the thick skin.  I told pt Dr. Amalia Hailey routinely recommended Bag Balm, Neutrogena or Eucerin for had skin and to put it on at night with socks.  Pt states that's what she's doing, just like he told her.

## 2015-06-17 NOTE — Telephone Encounter (Signed)
[name] states pt was ordered to get TED hose, but only has swelling during the day, and TED hose are used at night in bed.  [name] states she fitted pt with 15/20 mgHg compression hose, was that correct.  I told [name] that was correct.

## 2015-06-26 ENCOUNTER — Other Ambulatory Visit: Payer: Self-pay | Admitting: Emergency Medicine

## 2015-06-27 ENCOUNTER — Other Ambulatory Visit: Payer: Self-pay | Admitting: Emergency Medicine

## 2015-06-29 NOTE — Telephone Encounter (Signed)
Pt called to check on this and I had operator tell her I would call it into pharm in case they did not receive it before. Done.

## 2015-07-08 DIAGNOSIS — H4011X2 Primary open-angle glaucoma, moderate stage: Secondary | ICD-10-CM | POA: Diagnosis not present

## 2015-07-08 DIAGNOSIS — H4011X1 Primary open-angle glaucoma, mild stage: Secondary | ICD-10-CM | POA: Diagnosis not present

## 2015-07-08 DIAGNOSIS — H04123 Dry eye syndrome of bilateral lacrimal glands: Secondary | ICD-10-CM | POA: Diagnosis not present

## 2015-07-08 DIAGNOSIS — H25813 Combined forms of age-related cataract, bilateral: Secondary | ICD-10-CM | POA: Diagnosis not present

## 2015-07-12 ENCOUNTER — Telehealth: Payer: Self-pay

## 2015-07-12 NOTE — Telephone Encounter (Signed)
I will talk to her about drug treatment to increase appetite at her next office visit. See if she can get in and see me sometime in the next month

## 2015-07-12 NOTE — Telephone Encounter (Signed)
Pt is asking for something to help increase her appetite

## 2015-07-12 NOTE — Telephone Encounter (Signed)
Dr. Everlene Farrier, I believe we have been through this before. Any suggestions? She came to see you in April but I don't see the plan.

## 2015-07-13 NOTE — Telephone Encounter (Signed)
[name] spoke with pt and advised message from Dr. Everlene Farrier. Pt understood.

## 2015-08-03 ENCOUNTER — Telehealth: Payer: Self-pay

## 2015-08-03 NOTE — Telephone Encounter (Signed)
Called pt, no answer.

## 2015-08-03 NOTE — Telephone Encounter (Signed)
Call patient and let her know she should stay with the same agency until I see her for an office visit. We can discuss alternative agencies at that time

## 2015-08-03 NOTE — Telephone Encounter (Signed)
Spoke with pt, advised message from Dr. Everlene Farrier. She feels she does not get along.

## 2015-08-03 NOTE — Telephone Encounter (Signed)
Patient is requesting a referral to a new home health agency.  She said she is not satisfied with [facility].  CB#: [phone]

## 2015-08-05 ENCOUNTER — Ambulatory Visit: Payer: Medicare Other | Admitting: Gastroenterology

## 2015-08-06 ENCOUNTER — Telehealth: Payer: Self-pay

## 2015-08-06 NOTE — Telephone Encounter (Signed)
Patient is calling to let us know how happy she is to have medicaid. She also states that we should be getting a fax. Patient phone: [phone]

## 2015-08-08 ENCOUNTER — Telehealth: Payer: Self-pay | Admitting: Emergency Medicine

## 2015-08-08 DIAGNOSIS — M4120 Other idiopathic scoliosis, site unspecified: Secondary | ICD-10-CM

## 2015-08-08 DIAGNOSIS — R54 Age-related physical debility: Secondary | ICD-10-CM

## 2015-08-08 NOTE — Telephone Encounter (Signed)
Kriste Broman is requesting a referral. I am not sure what exactly she is requesting. She kept saying something about an agency([facility] on [address]) She said she need a in home aide. She is losing weight. She need someone to cook her breakfast. [phone]

## 2015-08-09 ENCOUNTER — Other Ambulatory Visit: Payer: Self-pay | Admitting: Emergency Medicine

## 2015-08-09 ENCOUNTER — Telehealth: Payer: Self-pay

## 2015-08-09 NOTE — Telephone Encounter (Signed)
If you want to go ahead and make the referral that would be fine. She does need help at home.

## 2015-08-09 NOTE — Telephone Encounter (Signed)
Pt states she need a referral to [facility] on [address] at [address]. The phone number is [phone] and she need ASAP. Please call [phone]

## 2015-08-09 NOTE — Telephone Encounter (Signed)
Dr, Everlene Farrier did you still want to wait until appt?

## 2015-08-10 NOTE — Telephone Encounter (Signed)
See other message

## 2015-08-10 NOTE — Telephone Encounter (Signed)
Dr. Everlene Farrier can you see order that is pended and make sure I did this correctly?

## 2015-08-11 ENCOUNTER — Other Ambulatory Visit: Payer: Self-pay | Admitting: Emergency Medicine

## 2015-08-24 ENCOUNTER — Ambulatory Visit: Payer: Medicare Other | Admitting: Emergency Medicine

## 2015-08-26 ENCOUNTER — Ambulatory Visit (INDEPENDENT_AMBULATORY_CARE_PROVIDER_SITE_OTHER): Payer: Medicare Other | Admitting: Emergency Medicine

## 2015-08-26 ENCOUNTER — Encounter: Payer: Self-pay | Admitting: Emergency Medicine

## 2015-08-26 VITALS — BP 128/69 | HR 92 | Temp 98.2°F | Resp 16 | Ht 59.0 in | Wt 107.4 lb

## 2015-08-26 DIAGNOSIS — Z23 Encounter for immunization: Secondary | ICD-10-CM | POA: Diagnosis not present

## 2015-08-26 DIAGNOSIS — N898 Other specified noninflammatory disorders of vagina: Secondary | ICD-10-CM

## 2015-08-26 DIAGNOSIS — L298 Other pruritus: Secondary | ICD-10-CM | POA: Diagnosis not present

## 2015-08-26 LAB — POCT WET PREP WITH KOH
Clue Cells Wet Prep HPF POC: NEGATIVE
KOH PREP POC: NEGATIVE
RBC Wet Prep HPF POC: NEGATIVE
TRICHOMONAS UA: NEGATIVE
WBC Wet Prep HPF POC: NEGATIVE
YEAST WET PREP PER HPF POC: NEGATIVE

## 2015-08-26 LAB — POCT URINALYSIS DIPSTICK
Bilirubin, UA: NEGATIVE
GLUCOSE UA: NEGATIVE
Ketones, UA: NEGATIVE
Leukocytes, UA: NEGATIVE
NITRITE UA: NEGATIVE
Protein, UA: NEGATIVE
Spec Grav, UA: 1.01
Urobilinogen, UA: 0.2
pH, UA: 6.5

## 2015-08-26 LAB — POCT UA - MICROSCOPIC ONLY
BACTERIA, U MICROSCOPIC: NEGATIVE
Casts, Ur, LPF, POC: NEGATIVE
Crystals, Ur, HPF, POC: NEGATIVE
Epithelial cells, urine per micros: NEGATIVE
Mucus, UA: NEGATIVE
WBC, Ur, HPF, POC: NEGATIVE
Yeast, UA: NEGATIVE

## 2015-08-26 NOTE — Progress Notes (Addendum)
Patient ID: [name], female   DOB: [birth year], 81 y.o.   MRN: [id number]     This chart was scribed for Arlyss Queen, MD by Zola Button, Medical Scribe. This patient was seen in room 24 and the patient's care was started at 11:52 AM.   Chief Complaint:  Chief Complaint  Patient presents with  . Hyperlipidemia  . Hypertension  . Vaginal Itching    HPI: [name] is a 81 y.o. female with a history of hyperlipidemia and hypertension who reports to [facility] today for a follow-up.   Patient reports having some vaginal itching. She states she has not been seeing anybody recently.  Patient has been drinking Boost once a day and has been getting Meals on Wheels. Her weight has been stable since last time.  Past Medical History  Diagnosis Date  . Glaucoma   . Dementia   . Spondylosis   . Foot deformity   . Migraine   . CAD (coronary artery disease)   . Elevated BP   . Diverticulosis   . H/O: GI bleed   . Acute renal insufficiency   . GERD (gastroesophageal reflux disease)   . Barrett's esophagus   . Hyperglycemia   . HSV-2 (herpes simplex virus 2) infection   . Abnormal finding on Pap smear, ASCUS   . Osteoporosis     worsening  . Amputation finger     index  . Hypertension    Past Surgical History  Procedure Laterality Date  . Partial hysterectomy    . Amputation finger / thumb      secondary to osteomyelitis  . Foot surgery      right  . Upper gastrointestinal endoscopy    . Colonoscopy     Social History   Social History  . Marital Status: Single    Spouse Name: N/A  . Number of Children: 1  . Years of Education: N/A   Occupational History  .     Social History Main Topics  . Smoking status: Never Smoker   . Smokeless tobacco: Never Used  . Alcohol Use: No  . Drug Use: No  . Sexual Activity: Not Asked   Other Topics Concern  . None   Social History Narrative   Family History  Problem Relation Age of Onset  . Breast cancer Mother   . Heart  attack Mother   . Heart disease Father   . Alcohol abuse Son   . Colon cancer Neg Hx   . Stomach cancer Neg Hx   . Rectal cancer Neg Hx    No Known Allergies Prior to Admission medications   Medication Sig Start Date End Date Taking? Authorizing Provider  aspirin 81 MG tablet Take 81 mg by mouth daily.      Historical Provider, MD  calcium carbonate (CALCIUM 500) 1250 MG tablet Take 3 tablets by mouth daily.     Historical Provider, MD  cephALEXin (KEFLEX) 500 MG capsule Take 1 capsule (500 mg total) by mouth 2 (two) times daily. 06/04/15   [name], MD  Cholecalciferol (VITAMIN D-3 PO) Take 4,500 mg by mouth daily.      Historical Provider, MD  conjugated estrogens (PREMARIN) vaginal cream Place 1 Applicatorful vaginally once a week.     Historical Provider, MD  desonide (DESOWEN) 0.05 % cream Apply 1 application topically 2 (two) times daily as needed (dry skin).     Historical Provider, MD  ferrous sulfate 325 (65 FE) MG tablet Take 325  mg by mouth daily with breakfast.      Historical Provider, MD  gabapentin (NEURONTIN) 800 MG tablet TAKE 1 TABLE BY MOUTH AT BEDTIME 10/26/14   Chelle Jeffery, PA-C  gabapentin (NEURONTIN) 800 MG tablet TAKE 1 TABLET BY MOUTH AT BEDTIME 08/10/15   [name], MD  gabapentin (NEURONTIN) 800 MG tablet TAKE 1 TABLET BY MOUTH AT BEDTIME 08/11/15   [name], MD  hydrochlorothiazide (MICROZIDE) 12.5 MG capsule TAKE ONE CAPSULE BY MOUTH EVERY TUES AND SATURDAY 06/29/15   [name], MD  hydroxypropyl methylcellulose (ISOPTO TEARS) 2.5 % ophthalmic solution Place 1 drop into both eyes 2 (two) times daily.    Historical Provider, MD  hydrOXYzine (ATARAX/VISTARIL) 10 MG tablet Take 10-30 mg by mouth every 4 (four) hours as needed for itching.     Historical Provider, MD  lactose free nutrition (BOOST) LIQD Take 237 mLs by mouth daily.    Historical Provider, MD  metoprolol succinate (TOPROL-XL) 50 MG 24 hr tablet take 1 tablet by mouth once daily 06/29/15    [name], MD  Multiple Vitamins-Minerals (EQL CENTRAL-VITE SELECT PO) Take 1 tablet by mouth daily.     Historical Provider, MD  Multiple Vitamins-Minerals (HAIR/SKIN/NAILS PO) Take by mouth daily.      Historical Provider, MD  nystatin (MYCOSTATIN) cream Apply 1 application topically as needed (dry skin).     Historical Provider, MD  omeprazole (PRILOSEC) 40 MG capsule Take 1 capsule (40 mg total) by mouth daily. 11/03/14   [name], MD  promethazine (PHENERGAN) 25 MG tablet Take 1 tablet (25 mg total) by mouth every 6 (six) hours as needed for nausea or vomiting. 07/31/14   Laban Emperor Zehr, PA-C  traMADol-acetaminophen (ULTRACET) 37.5-325 MG per tablet TAKE 1 TABLET BY MOUTH  EVERY 6 HOURS AS NEEDED FOR MODERATE PAIN 06/28/15   [name], MD  verapamil (CALAN-SR) 240 MG CR tablet take 1 tablet by mouth every morning 06/29/15   [name], MD  vitamin C (ASCORBIC ACID) 500 MG tablet Take 500 mg by mouth daily.    Historical Provider, MD     ROS: The patient denies fevers, chills, night sweats, unintentional weight loss, chest pain, palpitations, wheezing, dyspnea on exertion, nausea, vomiting, abdominal pain, dysuria, hematuria, melena, numbness, weakness, or tingling.   All other systems have been reviewed and were otherwise negative with the exception of those mentioned in the HPI and as above.    PHYSICAL EXAM: Filed Vitals:   08/26/15 1133  BP: 128/69  Pulse: 92  Temp: 98.2 F (36.8 C)  Resp: 16   Body mass index is 21.68 kg/(m^2).   General: Alert, no acute distress HEENT:  Normocephalic, atraumatic, oropharynx patent. Eye: [facility] Cardiovascular:  Regular rate and rhythm, no rubs murmurs or gallops.  No Carotid bruits, radial pulse intact. No pedal edema.  Respiratory: Clear to auscultation bilaterally.  No wheezes, rales, or rhonchi.  No cyanosis, no use of accessory musculature Abdominal: No organomegaly, abdomen is soft and non-tender, positive bowel  sounds.  No masses. Musculoskeletal: Severe kyphoscoliosis. Skin: No rashes. Neurologic: Facial musculature symmetric. Psychiatric: Patient acts appropriately throughout our interaction. Lymphatic: No cervical or submandibular lymphadenopathy Genitourinary/Anorectal: No obvious discharge. No sores around the vulva or vaginal area. She does have vitiligo in the vaginal area     LABS: Results for orders placed or performed in visit on 08/26/15  POCT UA - Microscopic Only  Result Value Ref Range  WBC, Ur, HPF, POC Negative    RBC, urine, microscopic 1-3    Bacteria, U Microscopic Negative    Mucus, UA Negative    Epithelial cells, urine per micros Negative    Crystals, Ur, HPF, POC Negative    Casts, Ur, LPF, POC Negative    Yeast, UA Negative   POCT urinalysis dipstick  Result Value Ref Range   Color, UA Yellow    Clarity, UA Clear    Glucose, UA Negative    Bilirubin, UA Negative    Ketones, UA Negative    Spec Grav, UA 1.010    Blood, UA Trace-Intact    pH, UA 6.5    Protein, UA Negative    Urobilinogen, UA 0.2    Nitrite, UA Negative    Leukocytes, UA Negative Negative  POCT Wet Prep with KOH  Result Value Ref Range   Trichomonas, UA Negative    Clue Cells Wet Prep HPF POC Negative    Epithelial Wet Prep HPF POC None (A) Few, Moderate, Many   Yeast Wet Prep HPF POC Negative    Bacteria Wet Prep HPF POC None None, Few   RBC Wet Prep HPF POC Negative    WBC Wet Prep HPF POC Negative    KOH Prep POC Negative      EKG/XRAY:   Primary read interpreted by Dr. Everlene Farrier at [facility].   ASSESSMENT/PLAN:  Urine and wet prep are normal. Patient was reassured. She does have vitiligo in her vaginal area but no lesions were seen.I personally performed the services described in this documentation, which was scribed in my presence. The recorded information has been reviewed and is accurate.    Gross sideeffects, risk and benefits, and alternatives of medications d/w patient.  Patient is aware that all medications have potential sideeffects and we are unable to predict every sideeffect or drug-drug interaction that may occur.  [name] 08/26/2015 12:01 PM

## 2015-08-31 ENCOUNTER — Telehealth: Payer: Self-pay

## 2015-08-31 DIAGNOSIS — E2839 Other primary ovarian failure: Secondary | ICD-10-CM

## 2015-08-31 NOTE — Telephone Encounter (Signed)
Patient would like an order/referral for a bone density test.  Please advise.  Thank you.

## 2015-08-31 NOTE — Telephone Encounter (Signed)
Can we order? 

## 2015-08-31 NOTE — Telephone Encounter (Signed)
Okay to order as osteoporosis screening if it has been 2 years.

## 2015-09-01 ENCOUNTER — Telehealth: Payer: Self-pay

## 2015-09-01 NOTE — Telephone Encounter (Signed)
Patient would like a call back a getting a referral for a bone density test. Please call!

## 2015-09-01 NOTE — Telephone Encounter (Signed)
Order placed

## 2015-09-02 ENCOUNTER — Other Ambulatory Visit: Payer: Self-pay | Admitting: Emergency Medicine

## 2015-09-04 ENCOUNTER — Telehealth: Payer: Self-pay | Admitting: *Deleted

## 2015-09-04 NOTE — Telephone Encounter (Signed)
Tramadol-acetaminophen (Ultracet) 37.5-325 MG was called into [facility] on [address] for this pt.    Please advise pt if/when she calls.

## 2015-09-05 NOTE — Telephone Encounter (Signed)
Thank you :)

## 2015-09-20 ENCOUNTER — Other Ambulatory Visit: Payer: Self-pay | Admitting: Emergency Medicine

## 2015-09-21 ENCOUNTER — Other Ambulatory Visit: Payer: Self-pay | Admitting: Emergency Medicine

## 2015-09-24 ENCOUNTER — Telehealth: Payer: Self-pay

## 2015-09-24 NOTE — Telephone Encounter (Signed)
Patient is calling to request a refill for Gabapentin

## 2015-09-24 NOTE — Telephone Encounter (Signed)
Refills at [facility] per pharmacist. He will get medication ready for pt.

## 2015-09-25 ENCOUNTER — Telehealth: Payer: Self-pay

## 2015-09-25 NOTE — Telephone Encounter (Signed)
Pt called req. Refills on any and all meds.Marland Kitchen///PT req call back from medical staff @ [phone]  [phone]

## 2015-09-27 NOTE — Telephone Encounter (Signed)
Spoke with pt, she did not know what I was referring to and said she would call me back if she remembers.

## 2015-09-28 ENCOUNTER — Encounter: Payer: Self-pay | Admitting: Emergency Medicine

## 2015-09-29 ENCOUNTER — Emergency Department (HOSPITAL_COMMUNITY)
Admission: EM | Admit: 2015-09-29 | Discharge: 2015-09-29 | Disposition: A | Discharge status: Home / Self Care | Payer: Medicare Other | Attending: Emergency Medicine | Admitting: Emergency Medicine

## 2015-09-29 ENCOUNTER — Emergency Department (HOSPITAL_COMMUNITY): Payer: Medicare Other

## 2015-09-29 ENCOUNTER — Telehealth: Payer: Self-pay

## 2015-09-29 ENCOUNTER — Encounter (HOSPITAL_COMMUNITY): Payer: Self-pay | Admitting: *Deleted

## 2015-09-29 DIAGNOSIS — G43909 Migraine, unspecified, not intractable, without status migrainosus: Secondary | ICD-10-CM | POA: Insufficient documentation

## 2015-09-29 DIAGNOSIS — M545 Low back pain: Secondary | ICD-10-CM | POA: Diagnosis not present

## 2015-09-29 DIAGNOSIS — I10 Essential (primary) hypertension: Secondary | ICD-10-CM | POA: Diagnosis not present

## 2015-09-29 DIAGNOSIS — Z79899 Other long term (current) drug therapy: Secondary | ICD-10-CM | POA: Insufficient documentation

## 2015-09-29 DIAGNOSIS — K219 Gastro-esophageal reflux disease without esophagitis: Secondary | ICD-10-CM | POA: Diagnosis not present

## 2015-09-29 DIAGNOSIS — M810 Age-related osteoporosis without current pathological fracture: Secondary | ICD-10-CM | POA: Diagnosis not present

## 2015-09-29 DIAGNOSIS — Z87448 Personal history of other diseases of urinary system: Secondary | ICD-10-CM | POA: Diagnosis not present

## 2015-09-29 DIAGNOSIS — I2510 Atherosclerotic heart disease of native coronary artery without angina pectoris: Secondary | ICD-10-CM | POA: Diagnosis not present

## 2015-09-29 DIAGNOSIS — Z8619 Personal history of other infectious and parasitic diseases: Secondary | ICD-10-CM | POA: Diagnosis not present

## 2015-09-29 DIAGNOSIS — F0390 Unspecified dementia without behavioral disturbance: Secondary | ICD-10-CM | POA: Diagnosis not present

## 2015-09-29 DIAGNOSIS — H409 Unspecified glaucoma: Secondary | ICD-10-CM | POA: Insufficient documentation

## 2015-09-29 DIAGNOSIS — Z7982 Long term (current) use of aspirin: Secondary | ICD-10-CM | POA: Diagnosis not present

## 2015-09-29 DIAGNOSIS — R52 Pain, unspecified: Secondary | ICD-10-CM

## 2015-09-29 DIAGNOSIS — M25551 Pain in right hip: Secondary | ICD-10-CM | POA: Diagnosis not present

## 2015-09-29 DIAGNOSIS — M62830 Muscle spasm of back: Secondary | ICD-10-CM

## 2015-09-29 MED ORDER — HYDROCODONE-ACETAMINOPHEN 5-325 MG PO TABS: 0.5000 | ORAL_TABLET | ORAL | Status: DC | PRN

## 2015-09-29 MED ORDER — HYDROCODONE-ACETAMINOPHEN 5-325 MG PO TABS
1.0000 | ORAL_TABLET | Freq: Once | ORAL | Status: AC
  Administered 2015-09-29: 1 via ORAL
  Filled 2015-09-29: qty 1

## 2015-09-29 MED ORDER — ONDANSETRON 4 MG PO TBDP
4.0000 mg | ORAL_TABLET | Freq: Once | ORAL | Status: AC
  Administered 2015-09-29: 4 mg via ORAL
  Filled 2015-09-29: qty 1

## 2015-09-29 MED ORDER — CYCLOBENZAPRINE HCL 5 MG PO TABS: 5.0000 mg | ORAL_TABLET | Freq: Three times a day (TID) | ORAL | Status: DC | PRN

## 2015-09-29 NOTE — ED Notes (Signed)
Patient ambulated 30 feet with assistance of a cane.

## 2015-09-29 NOTE — Discharge Instructions (Signed)
SEEK IMMEDIATE MEDICAL ATTENTION IF: New numbness, tingling, weakness, or problem with the use of your arms or legs.  Severe back pain not relieved with medications.  Change in bowel or bladder control.  Increasing pain in any areas of the body (such as chest or abdominal pain).  Shortness of breath, dizziness or fainting.  Nausea (feeling sick to your stomach), vomiting, fever, or sweats.   Muscle Cramps and Spasms Muscle cramps and spasms occur when a muscle or muscles tighten and you have no control over this tightening (involuntary muscle contraction). They are a common problem and can develop in any muscle. The most common place is in the calf muscles of the leg. Both muscle cramps and muscle spasms are involuntary muscle contractions, but they also have differences:   Muscle cramps are sporadic and painful. They may last a few seconds to a quarter of an hour. Muscle cramps are often more forceful and last longer than muscle spasms.  Muscle spasms may or may not be painful. They may also last just a few seconds or much longer. CAUSES  It is uncommon for cramps or spasms to be due to a serious underlying problem. In many cases, the cause of cramps or spasms is unknown. Some common causes are:   Overexertion.   Overuse from repetitive motions (doing the same thing over and over).   Remaining in a certain position for a long period of time.   Improper preparation, form, or technique while performing a sport or activity.   Dehydration.   Injury.   Side effects of some medicines.   Abnormally low levels of the salts and ions in your blood (electrolytes), especially potassium and calcium. This could happen if you are taking water pills (diuretics) or you are pregnant.  Some underlying medical problems can make it more likely to develop cramps or spasms. These include, but are not limited to:   Diabetes.   Parkinson disease.   Hormone disorders, such as thyroid  problems.   Alcohol abuse.   Diseases specific to muscles, joints, and bones.   Blood vessel disease where not enough blood is getting to the muscles.  HOME CARE INSTRUCTIONS   Stay well hydrated. Drink enough water and fluids to keep your urine clear or pale yellow.  It may be helpful to massage, stretch, and relax the affected muscle.  For tight or tense muscles, use a warm towel, heating pad, or hot shower water directed to the affected area.  If you are sore or have pain after a cramp or spasm, applying ice to the affected area may relieve discomfort.  Put ice in a plastic bag.  Place a towel between your skin and the bag.  Leave the ice on for 15-20 minutes, 03-04 times a day.  Medicines used to treat a known cause of cramps or spasms may help reduce their frequency or severity. Only take over-the-counter or prescription medicines as directed by your caregiver. SEEK MEDICAL CARE IF:  Your cramps or spasms get more severe, more frequent, or do not improve over time.  MAKE SURE YOU:   Understand these instructions.  Will watch your condition.  Will get help right away if you are not doing well or get worse.   This information is not intended to replace advice given to you by your health care provider. Make sure you discuss any questions you have with your health care provider.   Document Released: 06/02/2002 Document Revised: 04/07/2013 Document Reviewed: 11/27/2012 Elsevier Interactive Patient  Education 2016 [place] therapy can help ease sore, stiff, injured, and tight muscles and joints. Heat relaxes your muscles, which may help ease your pain.  RISKS AND COMPLICATIONS If you have any of the following conditions, do not use heat therapy unless your health care provider has approved:  Poor circulation.  Healing wounds or scarred skin in the area being treated.  Diabetes, heart disease, or high blood pressure.  Not being able to feel  (numbness) the area being treated.  Unusual swelling of the area being treated.  Active infections.  Blood clots.  Cancer.  Inability to communicate pain. This may include young children and people who have problems with their brain function (dementia).  Pregnancy. Heat therapy should only be used on old, pre-existing, or long-lasting (chronic) injuries. Do not use heat therapy on new injuries unless directed by your health care provider. HOW TO USE HEAT THERAPY There are several different kinds of heat therapy, including:  Moist heat pack.  Warm water bath.  Hot water bottle.  Electric heating pad.  Heated gel pack.  Heated wrap.  Electric heating pad. Use the heat therapy method suggested by your health care provider. Follow your health care provider's instructions on when and how to use heat therapy. GENERAL HEAT THERAPY RECOMMENDATIONS  Do not sleep while using heat therapy. Only use heat therapy while you are awake.  Your skin may turn pink while using heat therapy. Do not use heat therapy if your skin turns red.  Do not use heat therapy if you have new pain.  High heat or long exposure to heat can cause burns. Be careful when using heat therapy to avoid burning your skin.  Do not use heat therapy on areas of your skin that are already irritated, such as with a rash or sunburn. SEEK MEDICAL CARE IF:  You have blisters, redness, swelling, or numbness.  You have new pain.  Your pain is worse. MAKE SURE YOU:  Understand these instructions.  Will watch your condition.  Will get help right away if you are not doing well or get worse.   This information is not intended to replace advice given to you by your health care provider. Make sure you discuss any questions you have with your health care provider.   Document Released: 03/04/2012 Document Revised: 01/01/2015 Document Reviewed: 02/03/2014 Elsevier Interactive Patient Education 2016 [place] Injury Prevention Back injuries can be very painful. They can also be difficult to heal. After having one back injury, you are more likely to injure your back again. It is important to learn how to avoid injuring or re-injuring your back. The following tips can help you to prevent a back injury. WHAT SHOULD I KNOW ABOUT PHYSICAL FITNESS?  Exercise for 30 minutes per day on most days of the week or as directed by your health care provider. Make sure to:  Do aerobic exercises, such as walking, jogging, biking, or swimming.  Do exercises that increase balance and strength, such as tai chi and yoga. These can decrease your risk of falling and injuring your back.  Do stretching exercises to help with flexibility.  Try to develop strong abdominal muscles. Your abdominal muscles provide a lot of the support that is needed by your back.  Maintain a healthy weight. This helps to decrease your risk of a back injury. WHAT SHOULD I KNOW ABOUT MY DIET?  Talk with your health care provider about your overall diet. Take  supplements and vitamins only as directed by your health care provider.  Talk with your health care provider about how much calcium and vitamin D you need each day. These nutrients help to prevent weakening of the bones (osteoporosis). Osteoporosis can cause broken (fractured) bones, which lead to back pain.  Include good sources of calcium in your diet, such as dairy products, green leafy vegetables, and products that have had calcium added to them (fortified).  Include good sources of vitamin D in your diet, such as milk and foods that are fortified with vitamin D. WHAT SHOULD I KNOW ABOUT MY POSTURE?  Sit up straight and stand up straight. Avoid leaning forward when you sit or hunching over when you stand.  Choose chairs that have good low-back (lumbar) support.  If you work at a desk, sit close to it so you do not need to lean over. Keep your chin tucked in. Keep your  neck drawn back, and keep your elbows bent at a right angle. Your arms should look like the letter "L."  Sit high and close to the steering wheel when you drive. Add a lumbar support to your car seat, if needed.  Avoid sitting or standing in one position for very long. Take breaks to get up, stretch, and walk around at least one time every hour. Take breaks every hour if you are driving for long periods of time.  Sleep on your side with your knees slightly bent, or sleep on your back with a pillow under your knees. Do not lie on the front of your body to sleep. WHAT SHOULD I KNOW ABOUT LIFTING, TWISTING, AND REACHING? Lifting and Heavy Lifting  Avoid heavy lifting, especially repetitive heavy lifting. If you must do heavy lifting:  Stretch before lifting.  Work slowly.  Rest between lifts.  Use a tool such as a cart or a dolly to move objects if one is available.  Make several small trips instead of carrying one heavy load.  Ask for help when you need it, especially when moving big objects.  Follow these steps when lifting:  Stand with your feet shoulder-width apart.  Get as close to the object as you can. Do not try to pick up a heavy object that is far from your body.  Use handles or lifting straps if they are available.  Bend at your knees. Squat down, but keep your heels off the floor.  Keep your shoulders pulled back, your chin tucked in, and your back straight.  Lift the object slowly while you tighten the muscles in your legs, abdomen, and buttocks. Keep the object as close to the center of your body as possible.  Follow these steps when putting down a heavy load:  Stand with your feet shoulder-width apart.  Lower the object slowly while you tighten the muscles in your legs, abdomen, and buttocks. Keep the object as close to the center of your body as possible.  Keep your shoulders pulled back, your chin tucked in, and your back straight.  Bend at your knees.  Squat down, but keep your heels off the floor.  Use handles or lifting straps if they are available. Twisting and Reaching  Avoid lifting heavy objects above your waist.  Do not twist at your waist while you are lifting or carrying a load. If you need to turn, move your feet.  Do not bend over without bending at your knees.  Avoid reaching over your head, across a table, or for an object  on a high surface. WHAT ARE SOME OTHER TIPS?  Avoid wet floors and icy ground. Keep sidewalks clear of ice to prevent falls.  Do not sleep on a mattress that is too soft or too hard.  Keep items that are used frequently within easy reach.  Put heavier objects on shelves at waist level, and put lighter objects on lower or higher shelves.  Find ways to decrease your stress, such as exercise, massage, or relaxation techniques. Stress can build up in your muscles. Tense muscles are more vulnerable to injury.  Talk with your health care provider if you feel anxious or depressed. These conditions can make back pain worse.  Wear flat heel shoes with cushioned soles.  Avoid sudden movements.  Use both shoulder straps when carrying a backpack.  Do not use any tobacco products, including cigarettes, chewing tobacco, or electronic cigarettes. If you need help quitting, ask your health care provider.   This information is not intended to replace advice given to you by your health care provider. Make sure you discuss any questions you have with your health care provider.   Document Released: 01/18/2005 Document Revised: 04/27/2015 Document Reviewed: 12/15/2014 Elsevier Interactive Patient Education [employer].

## 2015-09-29 NOTE — Telephone Encounter (Signed)
Dr. Everlene Farrier I spoke to pt.

## 2015-09-29 NOTE — ED Notes (Signed)
Bed: QT62 Expected date:  Expected time:  Means of arrival:  Comments: Ems- elderly

## 2015-09-29 NOTE — ED Notes (Signed)
Pt was given lunch as well as snacks to go

## 2015-09-29 NOTE — ED Provider Notes (Signed)
CSN: [id number]     Arrival date & time 09/29/15  0957 History   First MD Initiated Contact with Patient 09/29/15 1008     Chief Complaint  Patient presents with  . Hip Pain     (Consider location/radiation/quality/duration/timing/severity/associated sxs/prior Treatment) HPI   [name] is a(n) 81 y.o. female who presents for R low back and hip pain. Patient states that yesterday around 4 PM she started having pain in her right lower back. She states that it feels like a muscle. Patient states that she is used to going really fast and slow gardens every day and takes care of her yard. She states "I also take care of my neighbor's yard because They don't how to clean up after themselves." Patient states that it's difficult for her to walk because of her low back pain. She is currently using a cane and states that she "never has to walk with a cane." She denies urinary sxs, fever, chills, hx of hip pain. Patient states that she thinks she twisted. LEVEL V CAVEAT DUE TO DEMENTIA  Past Medical History  Diagnosis Date  . Glaucoma   . Dementia   . Spondylosis   . Foot deformity   . Migraine   . CAD (coronary artery disease)   . Elevated BP   . Diverticulosis   . H/O: GI bleed   . Acute renal insufficiency   . GERD (gastroesophageal reflux disease)   . Barrett's esophagus   . Hyperglycemia   . HSV-2 (herpes simplex virus 2) infection   . Abnormal finding on Pap smear, ASCUS   . Osteoporosis     worsening  . Amputation finger     index  . Hypertension    Past Surgical History  Procedure Laterality Date  . Partial hysterectomy    . Amputation finger / thumb      secondary to osteomyelitis  . Foot surgery      right  . Upper gastrointestinal endoscopy    . Colonoscopy     Family History  Problem Relation Age of Onset  . Breast cancer Mother   . Heart attack Mother   . Heart disease Father   . Alcohol abuse Son   . Colon cancer Neg Hx   . Stomach cancer Neg Hx   .  Rectal cancer Neg Hx    Social History  Substance Use Topics  . Smoking status: Never Smoker   . Smokeless tobacco: Never Used  . Alcohol Use: No   OB History    No data available     Review of Systems  Unable to perform ROS     Allergies  Review of patient's allergies indicates no known allergies.  Home Medications   Prior to Admission medications   Medication Sig Start Date End Date Taking? Authorizing Provider  aspirin 81 MG tablet Take 81 mg by mouth daily.      Historical Provider, MD  calcium carbonate (CALCIUM 500) 1250 MG tablet Take 3 tablets by mouth daily.     Historical Provider, MD  cephALEXin (KEFLEX) 500 MG capsule Take 1 capsule (500 mg total) by mouth 2 (two) times daily. Patient not taking: Reported on 09/29/2015 06/04/15   [name], MD  Cholecalciferol (VITAMIN D-3 PO) Take 4,500 mg by mouth daily.      Historical Provider, MD  conjugated estrogens (PREMARIN) vaginal cream Place 1 Applicatorful vaginally once a week.     Historical Provider, MD  cyclobenzaprine (FLEXERIL) 5 MG tablet  Take 1 tablet (5 mg total) by mouth 3 (three) times daily as needed for muscle spasms. 09/29/15   Margarita Mail, PA-C  desonide (DESOWEN) 0.05 % cream Apply 1 application topically 2 (two) times daily as needed (dry skin).     Historical Provider, MD  ferrous sulfate 325 (65 FE) MG tablet Take 325 mg by mouth daily with breakfast.      Historical Provider, MD  gabapentin (NEURONTIN) 800 MG tablet TAKE 1 TABLE BY MOUTH AT BEDTIME 10/26/14   Chelle Jeffery, PA-C  gabapentin (NEURONTIN) 800 MG tablet TAKE 1 TABLET BY MOUTH AT BEDTIME Patient not taking: Reported on 09/29/2015 08/10/15   [name], MD  gabapentin (NEURONTIN) 800 MG tablet TAKE 1 TABLET BY MOUTH AT BEDTIME Patient not taking: Reported on 09/29/2015 08/11/15   [name], MD  hydrochlorothiazide (MICROZIDE) 12.5 MG capsule TAKE ONE CAPSULE BY MOUTH EVERY TUES AND SATURDAY 06/29/15   [name], MD    HYDROcodone-acetaminophen (NORCO) 5-325 MG tablet Take 0.5-1 tablets by mouth every 4 (four) hours as needed for moderate pain. 09/29/15   Margarita Mail, PA-C  hydroxypropyl methylcellulose (ISOPTO TEARS) 2.5 % ophthalmic solution Place 1 drop into both eyes 2 (two) times daily.    Historical Provider, MD  hydrOXYzine (ATARAX/VISTARIL) 10 MG tablet Take 10-30 mg by mouth every 4 (four) hours as needed for itching.     Historical Provider, MD  lactose free nutrition (BOOST) LIQD Take 237 mLs by mouth daily.    Historical Provider, MD  metoprolol succinate (TOPROL-XL) 50 MG 24 hr tablet TAKE 1 TABLET BY MOUTH ONCE DAILY 09/22/15   Mancel Bale, PA-C  Multiple Vitamins-Minerals (EQL CENTRAL-VITE SELECT PO) Take 1 tablet by mouth daily.     Historical Provider, MD  Multiple Vitamins-Minerals (HAIR/SKIN/NAILS PO) Take by mouth daily.      Historical Provider, MD  nystatin (MYCOSTATIN) cream Apply 1 application topically as needed (dry skin).     Historical Provider, MD  omeprazole (PRILOSEC) 40 MG capsule Take 1 capsule (40 mg total) by mouth daily. 11/03/14   [name], MD  promethazine (PHENERGAN) 25 MG tablet Take 1 tablet (25 mg total) by mouth every 6 (six) hours as needed for nausea or vomiting. 07/31/14   Laban Emperor Zehr, PA-C  traMADol-acetaminophen (ULTRACET) 37.5-325 MG per tablet take 1 tablet by mouth every 6 hours if needed for pain 09/03/15   [name], MD  verapamil (CALAN-SR) 240 MG CR tablet Take 1 tablet (240 mg total) by mouth every morning. PATIENT NEEDS BLOOD PRESSURE CHECK UP FOR ADDITIONAL REFILLS 09/21/15   [name], MD  vitamin C (ASCORBIC ACID) 500 MG tablet Take 500 mg by mouth daily.    Historical Provider, MD   BP 138/59 mmHg  Pulse 71  Temp(Src) 98 F (36.7 C) (Oral)  Resp 16  SpO2 95% Physical Exam  Constitutional: She is oriented to person, place, and time. She appears well-developed and well-nourished. No distress.  HENT:  Head: Normocephalic and  atraumatic.  Eyes: Conjunctivae are normal. No scleral icterus.  Neck: Normal range of motion.  Cardiovascular: Normal rate, regular rhythm and normal heart sounds.  Exam reveals no gallop and no friction rub.   No murmur heard. Pulmonary/Chest: Effort normal and breath sounds normal. No respiratory distress.  Abdominal: Soft. Bowel sounds are normal. She exhibits no distension and no mass. There is no tenderness. There is no guarding.  Musculoskeletal:       Back:  Neurological: She is alert and oriented to person, place, and time.  Skin: Skin is warm and dry. She is not diaphoretic.  Psychiatric:  Rapid and pressured speech    ED Course  Procedures (including critical care time) Labs Review Labs Reviewed - No data to display  Imaging Review Dg Lumbar Spine Complete  09/29/2015   CLINICAL DATA:  81 year old female with lumbar back pain radiating to the hips. No known injury. Initial encounter.  EXAM: LUMBAR SPINE - COMPLETE 4+ VIEW  COMPARISON:  CT Abdomen and Pelvis 02/26/2014.  FINDINGS: Moderate thoracolumbar scoliosis. Lumbar segmentation appears normal. On the lateral view lumbar vertebral height and alignment is stable since 2015, with straightening of lumbar lordosis. There is chronic severe lumbar disc degeneration with vacuum disc throughout. There is severe endplate and vertebral body sclerosis and widespread osteophytosis. The L1-L2 level is relatively spared, but the T12-L1 level is similarly affected. No pars fracture identified. Visible lower thoracic levels appear grossly intact. Sacral ala and SI joints appear stable. Osteopenia about the pelvis. External safety pin artifact.  IMPRESSION: 1.  No acute osseous abnormality identified in the lumbar spine. 2. Chronic severe lumbar and lower thoracic disc and endplate degeneration superimposed on moderate thoracolumbar scoliosis.   Electronically Signed   By: Genevie Ann M.D.   On: 09/29/2015 11:25   Dg Hip Unilat With Pelvis 2-3  Views Right  09/29/2015   CLINICAL DATA:  81 year old female with lumbar back pain radiating to the hips. No known injury. Initial encounter.  EXAM: DG HIP (WITH OR WITHOUT PELVIS) 2-3V RIGHT  COMPARISON:  Lumbar series from today reported separately. CT Abdomen and Pelvis 02/26/2014.  FINDINGS: External safety pin artifact. Femoral heads remain normally located. Stable mild to moderate hip joint space loss, greater on the right. Mild to moderate femoral and acetabular degenerative spurring. Chondrocalcinosis possible given the appearance on the comparison CT Abdomen and Pelvis. No acute pelvis fracture. Proximal left femur grossly intact. Proximal right femur intact. Mild for age calcified atherosclerosis.  IMPRESSION: 1. No acute fracture or dislocation identified about the right hip or pelvis. 2. Right greater than left hip osteoarthritis and possible chondrocalcinosis.   Electronically Signed   By: Genevie Ann M.D.   On: 09/29/2015 11:27   I have personally reviewed and evaluated these images and lab results as part of my medical decision-making.   EKG Interpretation None      MDM   Final diagnoses:  Back muscle spasm    Patient xrays negative. Pain addressed in the ED and patient now able to sit up and amblate. Will d/c home to f/u with pcp. Patient with back pain.  No neurological deficits and normal neuro exam.  Patient can walk but states is painful.  No loss of bowel or bladder control.  No concern for cauda equina.  No fever, night sweats, weight loss, h/o cancer, IVDU.  RICE protocol and pain medicine indicated and discussed with patient.  Patient seen in shared visit with attending physician.         Margarita Mail, PA-C 09/30/15 [id number]  Sherwood Gambler, MD 09/30/15 [phone]

## 2015-09-29 NOTE — ED Notes (Signed)
Pt here by EMS for right hip pain which began yesterday, not associated with any trauma.  Pt is ambulatory at home with a cane, increased pain with ambulation and palpation to right hip.

## 2015-09-29 NOTE — Telephone Encounter (Signed)
Pt wants dr Everlene Farrier to know that she is at [facility] and that she is not going to leave until she sees a doctor and that they have diagnosed with arthritis and she hasnt been examined yet

## 2015-10-02 ENCOUNTER — Other Ambulatory Visit: Payer: Self-pay | Admitting: Emergency Medicine

## 2015-10-07 ENCOUNTER — Ambulatory Visit (INDEPENDENT_AMBULATORY_CARE_PROVIDER_SITE_OTHER): Payer: Medicare Other | Admitting: Emergency Medicine

## 2015-10-07 ENCOUNTER — Encounter: Payer: Self-pay | Admitting: Emergency Medicine

## 2015-10-07 VITALS — BP 132/72 | HR 86 | Temp 97.3°F | Resp 16 | Ht 59.0 in | Wt 105.0 lb

## 2015-10-07 DIAGNOSIS — M4120 Other idiopathic scoliosis, site unspecified: Secondary | ICD-10-CM | POA: Diagnosis not present

## 2015-10-07 DIAGNOSIS — R54 Age-related physical debility: Secondary | ICD-10-CM

## 2015-10-07 DIAGNOSIS — R634 Abnormal weight loss: Secondary | ICD-10-CM

## 2015-10-07 MED ORDER — TRAMADOL-ACETAMINOPHEN 37.5-325 MG PO TABS: ORAL_TABLET | ORAL | Status: DC

## 2015-10-07 MED ORDER — CYCLOBENZAPRINE HCL 5 MG PO TABS: ORAL_TABLET | ORAL | Status: DC

## 2015-10-07 NOTE — Progress Notes (Addendum)
This chart was scribed for [name] Queen, MD by Leandra Kern, Medical Scribe. This patient was seen in Room 22 and the patient's care was started at 10:37 AM.  Chief Complaint:  Chief Complaint  Patient presents with  . Hospitalization Follow-up  . Spasms    HPI: [name] is a 81 y.o. female with a PMHx of dementia, HTN, HLD, CAD, and hyperglycemia, and who reports to [facility] today for a hospital follow up. Pt was seen at the ED on 10/05 for right lower back and hip pain. Pt had X-rays done of the area which were negative. Pt was diagnosed with back muscle spasm and was prescribed Norco and flexeril for the pain.  Today, pt notes that she is doing well overall. She indicates that her muscle spasm have improved. She reports that she is finding a great relief with her medications. She takes 1 dose a day of the hydrocodone. She notes that she is happy with her life. Pt states that she plans to live to 81 years old.  Pt is UTD with the flu and shingles vaccines.   Pt notes that there are bed bugs at her house where she pointed out few bug bites on her legs.    Past Medical History  Diagnosis Date  . Glaucoma   . Dementia   . Spondylosis   . Foot deformity   . Migraine   . CAD (coronary artery disease)   . Elevated BP   . Diverticulosis   . H/O: GI bleed   . Acute renal insufficiency   . GERD (gastroesophageal reflux disease)   . Barrett's esophagus   . Hyperglycemia   . HSV-2 (herpes simplex virus 2) infection   . Abnormal finding on Pap smear, ASCUS   . Osteoporosis     worsening  . Amputation finger     index  . Hypertension    Past Surgical History  Procedure Laterality Date  . Partial hysterectomy    . Amputation finger / thumb      secondary to osteomyelitis  . Foot surgery      right  . Upper gastrointestinal endoscopy    . Colonoscopy     Social History   Social History  . Marital Status: Single    Spouse Name: N/A  . Number of Children: 1  . Years  of Education: N/A   Occupational History  .     Social History Main Topics  . Smoking status: Never Smoker   . Smokeless tobacco: Never Used  . Alcohol Use: No  . Drug Use: No  . Sexual Activity: Not Asked   Other Topics Concern  . None   Social History Narrative   Family History  Problem Relation Age of Onset  . Breast cancer Mother   . Heart attack Mother   . Heart disease Father   . Alcohol abuse Son   . Colon cancer Neg Hx   . Stomach cancer Neg Hx   . Rectal cancer Neg Hx    No Known Allergies Prior to Admission medications   Medication Sig Start Date End Date Taking? Authorizing Provider  aspirin 81 MG tablet Take 81 mg by mouth daily.      Historical Provider, MD  calcium carbonate (CALCIUM 500) 1250 MG tablet Take 3 tablets by mouth daily.     Historical Provider, MD  cephALEXin (KEFLEX) 500 MG capsule Take 1 capsule (500 mg total) by mouth 2 (two) times daily. Patient not  taking: Reported on 09/29/2015 06/04/15   [name], MD  Cholecalciferol (VITAMIN D-3 PO) Take 4,500 mg by mouth daily.      Historical Provider, MD  conjugated estrogens (PREMARIN) vaginal cream Place 1 Applicatorful vaginally once a week.     Historical Provider, MD  cyclobenzaprine (FLEXERIL) 5 MG tablet Take 1 tablet (5 mg total) by mouth 3 (three) times daily as needed for muscle spasms. 09/29/15   Margarita Mail, PA-C  desonide (DESOWEN) 0.05 % cream Apply 1 application topically 2 (two) times daily as needed (dry skin).     Historical Provider, MD  ferrous sulfate 325 (65 FE) MG tablet Take 325 mg by mouth daily with breakfast.      Historical Provider, MD  gabapentin (NEURONTIN) 800 MG tablet TAKE 1 TABLE BY MOUTH AT BEDTIME 10/26/14   Chelle Jeffery, PA-C  gabapentin (NEURONTIN) 800 MG tablet TAKE 1 TABLET BY MOUTH AT BEDTIME Patient not taking: Reported on 09/29/2015 08/10/15   [name], MD  gabapentin (NEURONTIN) 800 MG tablet TAKE 1 TABLET BY MOUTH AT BEDTIME Patient not taking:  Reported on 09/29/2015 08/11/15   [name], MD  hydrochlorothiazide (MICROZIDE) 12.5 MG capsule TAKE 1 CAPSULE BY MOUTH EVERY TUES AND SATURDAY 10/02/15   Ezekiel Slocumb, PA-C  HYDROcodone-acetaminophen (NORCO) 5-325 MG tablet Take 0.5-1 tablets by mouth every 4 (four) hours as needed for moderate pain. 09/29/15   Margarita Mail, PA-C  hydroxypropyl methylcellulose (ISOPTO TEARS) 2.5 % ophthalmic solution Place 1 drop into both eyes 2 (two) times daily.    Historical Provider, MD  hydrOXYzine (ATARAX/VISTARIL) 10 MG tablet Take 10-30 mg by mouth every 4 (four) hours as needed for itching.     Historical Provider, MD  lactose free nutrition (BOOST) LIQD Take 237 mLs by mouth daily.    Historical Provider, MD  metoprolol succinate (TOPROL-XL) 50 MG 24 hr tablet TAKE 1 TABLET BY MOUTH ONCE DAILY 09/22/15   Mancel Bale, PA-C  Multiple Vitamins-Minerals (EQL CENTRAL-VITE SELECT PO) Take 1 tablet by mouth daily.     Historical Provider, MD  Multiple Vitamins-Minerals (HAIR/SKIN/NAILS PO) Take by mouth daily.      Historical Provider, MD  nystatin (MYCOSTATIN) cream Apply 1 application topically as needed (dry skin).     Historical Provider, MD  omeprazole (PRILOSEC) 40 MG capsule Take 1 capsule (40 mg total) by mouth daily. 11/03/14   [name], MD  promethazine (PHENERGAN) 25 MG tablet Take 1 tablet (25 mg total) by mouth every 6 (six) hours as needed for nausea or vomiting. 07/31/14   Laban Emperor Zehr, PA-C  traMADol-acetaminophen (ULTRACET) 37.5-325 MG per tablet take 1 tablet by mouth every 6 hours if needed for pain 09/03/15   [name], MD  verapamil (CALAN-SR) 240 MG CR tablet Take 1 tablet (240 mg total) by mouth every morning. PATIENT NEEDS BLOOD PRESSURE CHECK UP FOR ADDITIONAL REFILLS 09/21/15   [name], MD  vitamin C (ASCORBIC ACID) 500 MG tablet Take 500 mg by mouth daily.    Historical Provider, MD     ROS: The patient denies fevers, chills, night sweats, unintentional weight  loss, chest pain, palpitations, wheezing, dyspnea on exertion, nausea, vomiting, abdominal pain, dysuria, hematuria, melena, numbness, weakness, or tingling.   All other systems have been reviewed and were otherwise negative with the exception of those mentioned in the HPI and as above.    PHYSICAL EXAM: Filed Vitals:   10/07/15 1017  BP: 132/72  Pulse: 86  Temp: 97.3 F (36.3 C)  Resp: 16   Body mass index is 21.2 kg/(m^2).   General: Alert, no acute distress HEENT:  Normocephalic, atraumatic, oropharynx patent. Eye: [facility] Cardiovascular:  Regular rate and rhythm, no rubs murmurs or gallops.  No Carotid bruits, radial pulse intact. No pedal edema.  Respiratory: Clear to auscultation bilaterally.  No wheezes, rales, or rhonchi.  No cyanosis, no use of accessory musculature Abdominal: No organomegaly, abdomen is soft and non-tender, positive bowel sounds.  No masses. Musculoskeletal: Gait intact. No edema, tenderness Skin: No rashes. Neurologic: Facial musculature symmetric. Psychiatric: Patient acts appropriately throughout our interaction. Lymphatic: No cervical or submandibular lymphadenopathy This SmartLink has not been configured with any valid records.    LABS:    EKG/XRAY:   Primary read interpreted by Dr. Everlene Farrier at [facility].   ASSESSMENT/PLAN: I changed her over to tramadol with Tylenol to have for her back pain. She was also given Flexeril. Both of these were written prescriptions. She is up-to-date on her immunizations. Will recheck in 3 months.  By signing my name below, I, Rawaa Al Rifaie, attest that this documentation has been prepared under the direction and in the presence of [name] Queen, MD.  Leandra Kern, Medical Scribe. 10/07/2015.  10:50 AM. I personally performed the services described in this documentation, which was scribed in my presence. The recorded information has been reviewed and is accurate.   Gross sideeffects, risk and benefits, and  alternatives of medications d/w patient. Patient is aware that all medications have potential sideeffects and we are unable to predict every sideeffect or drug-drug interaction that may occur.  [name] Queen MD 10/07/2015 10:37 AM

## 2015-10-08 ENCOUNTER — Ambulatory Visit: Payer: Medicare Other | Admitting: Gastroenterology

## 2015-10-20 ENCOUNTER — Other Ambulatory Visit: Payer: Self-pay | Admitting: Emergency Medicine

## 2015-10-20 NOTE — Telephone Encounter (Signed)
Patient is calling to check on medication refill. She states that's it's emergent and she could die if she doesn't have it.

## 2015-10-21 ENCOUNTER — Other Ambulatory Visit: Payer: Self-pay

## 2015-10-21 NOTE — Telephone Encounter (Signed)
Pt called back in to check on her refill. Please be advised

## 2015-10-21 NOTE — Telephone Encounter (Signed)
Dr Everlene Farrier, you saw pt this month, but don't see BP discussed. OK to give RFs?

## 2015-10-21 NOTE — Telephone Encounter (Signed)
Patient is calling because he medication hasn't been filled. Patient states she is out of medication and she is going to tell Dr. Everlene Farrier about this when he gets back. Patient would like for someone to call her.

## 2015-10-22 MED ORDER — VERAPAMIL HCL ER 240 MG PO TBCR: 240.0000 mg | EXTENDED_RELEASE_TABLET | Freq: Every day | ORAL | Status: DC

## 2015-10-22 NOTE — Telephone Encounter (Signed)
Refill completed in Dr. Perfecto Kingdom absence.

## 2015-10-22 NOTE — Telephone Encounter (Signed)
RF was OKd and sent yesterday. Notified pt. Pt wondered if Dr Everlene Farrier would OK the rest of the RFs for her when he returns. For Dr Perfecto Kingdom review on his return.

## 2015-10-26 DIAGNOSIS — M8589 Other specified disorders of bone density and structure, multiple sites: Secondary | ICD-10-CM | POA: Diagnosis not present

## 2015-10-26 DIAGNOSIS — Z1231 Encounter for screening mammogram for malignant neoplasm of breast: Secondary | ICD-10-CM | POA: Diagnosis not present

## 2015-11-14 NOTE — Telephone Encounter (Signed)
Pt called to state she is feeling better.

## 2015-12-02 ENCOUNTER — Ambulatory Visit (INDEPENDENT_AMBULATORY_CARE_PROVIDER_SITE_OTHER): Payer: Medicare Other | Admitting: Emergency Medicine

## 2015-12-02 ENCOUNTER — Encounter: Payer: Self-pay | Admitting: Emergency Medicine

## 2015-12-02 ENCOUNTER — Telehealth: Payer: Self-pay

## 2015-12-02 VITALS — BP 130/84 | HR 91 | Temp 98.2°F | Resp 18 | Ht 59.0 in | Wt 105.0 lb

## 2015-12-02 DIAGNOSIS — N390 Urinary tract infection, site not specified: Secondary | ICD-10-CM | POA: Diagnosis not present

## 2015-12-02 DIAGNOSIS — R634 Abnormal weight loss: Secondary | ICD-10-CM

## 2015-12-02 DIAGNOSIS — I10 Essential (primary) hypertension: Secondary | ICD-10-CM

## 2015-12-02 LAB — BASIC METABOLIC PANEL WITH GFR
BUN: 19 mg/dL (ref 7–25)
CALCIUM: 9.7 mg/dL (ref 8.6–10.4)
CO2: 32 mmol/L — ABNORMAL HIGH (ref 20–31)
CREATININE: 0.82 mg/dL (ref 0.60–0.88)
Chloride: 107 mmol/L (ref 98–110)
GFR, EST AFRICAN AMERICAN: 78 mL/min (ref >=60)
GFR, Est Non African American: 68 mL/min (ref >=60)
GLUCOSE: 87 mg/dL (ref 65–99)
Potassium: 4.8 mmol/L (ref 3.5–5.3)
SODIUM: 146 mmol/L (ref 135–146)

## 2015-12-02 LAB — CBC WITH DIFFERENTIAL/PLATELET
BASOS PCT: 0 % (ref 0–1)
Basophils Absolute: 0 10*3/uL (ref 0.0–0.1)
EOS PCT: 3 % (ref 0–5)
Eosinophils Absolute: 0.2 10*3/uL (ref 0.0–0.7)
HEMATOCRIT: 37.1 % (ref 36.0–46.0)
Hemoglobin: 11.9 g/dL — ABNORMAL LOW (ref 12.0–15.0)
Lymphocytes Relative: 12 % (ref 12–46)
Lymphs Abs: 0.6 10*3/uL — ABNORMAL LOW (ref 0.7–4.0)
MCH: 27.5 pg (ref 26.0–34.0)
MCHC: 32.1 g/dL (ref 30.0–36.0)
MCV: 85.9 fL (ref 78.0–100.0)
MONO ABS: 0.4 10*3/uL (ref 0.1–1.0)
MONOS PCT: 8 % (ref 3–12)
MPV: 9.4 fL (ref 8.6–12.4)
Neutro Abs: 4 10*3/uL (ref 1.7–7.7)
Neutrophils Relative %: 77 % (ref 43–77)
Platelets: 440 10*3/uL — ABNORMAL HIGH (ref 150–400)
RBC: 4.32 MIL/uL (ref 3.87–5.11)
RDW: 13.8 % (ref 11.5–15.5)
WBC: 5.2 10*3/uL (ref 4.0–10.5)

## 2015-12-02 LAB — POCT URINALYSIS DIP (MANUAL ENTRY)
BILIRUBIN UA: NEGATIVE
Glucose, UA: NEGATIVE
Ketones, POC UA: NEGATIVE
NITRITE UA: POSITIVE — AB
PH UA: 7
Protein Ur, POC: NEGATIVE
RBC UA: NEGATIVE
SPEC GRAV UA: 1.015
Urobilinogen, UA: 0.2

## 2015-12-02 LAB — POC MICROSCOPIC URINALYSIS (UMFC): Mucus: ABSENT

## 2015-12-02 NOTE — Telephone Encounter (Signed)
Please see previous message

## 2015-12-02 NOTE — Progress Notes (Signed)
Subjective:  This chart was scribed for [name], MD by Tamsen Roers, at [facility].  This patient was seen in room 22 and the patient's care was started at 11:51 AM.    Patient ID: [name], female    DOB: 09-Aug-1933, 81 y.o.   MRN: [id number] Chief Complaint  Patient presents with  . Follow-up    3 month check up   HPI  HPI Comments: [name] is a 81 y.o. female who presents to the [facility] for a follow up.  Patient is drinking 1 boost per day and states that it is too expensive for her.  She states that she is still not eating well and would like to eat more.  She currently has food stamps. She is up to date with her flu/ pneumonia vaccinations.   Back pain: Patient states that she has " a bad back".     She denies any chest pain/ shortness of breath.    Patients home was recently found to be infested with termites so she is having her floors renovated.    Wt Readings from Last 3 Encounters:  12/02/15 105 lb (47.628 kg)  10/07/15 105 lb (47.628 kg)  08/26/15 107 lb 6.4 oz (48.716 kg)    Patient Active Problem List   Diagnosis Date Noted  . Rectal bleeding 11/25/2014  . Gastroesophageal reflux disease without esophagitis 07/31/2014  . Loss of weight 07/31/2014  . Gastric ulcer without hemorrhage or perforation 07/22/2013  . Syphilis 02/27/2012  . Dementia   . Spondylosis   . Foot deformity   . Migraine   . CAD (coronary artery disease)   . Acute renal insufficiency   . GERD (gastroesophageal reflux disease)   . Hyperglycemia   . HSV-2 (herpes simplex virus 2) infection   . Abnormal finding on Pap smear, ASCUS   . Osteoporosis   . CONSTIPATION 12/12/2010  . NONSPECIFIC ABNORMAL FINDING IN STOOL CONTENTS 12/12/2010  . INFECTION, SKIN AND SOFT TISSUE 11/10/2010  . ABDOMINAL PAIN RIGHT UPPER QUADRANT 11/10/2010  . PUD, HX OF 11/10/2010  . Hemorrhage of rectum and anus 02/21/2010  . ANEMIA, IRON DEFICIENCY  06/17/2008  . BARRETTS ESOPHAGUS 06/17/2008  . HYPERLIPIDEMIA 06/16/2008  . GLAUCOMA 06/16/2008  . HYPERTENSION 06/16/2008  . DIVERTICULOSIS, COLON 06/16/2008  . HEMOCCULT POSITIVE STOOL 06/16/2008  . ARTHRITIS 06/16/2008  . SLEEP APNEA 06/16/2008   Past Medical History  Diagnosis Date  . Glaucoma   . Dementia   . Spondylosis   . Foot deformity   . Migraine   . CAD (coronary artery disease)   . Elevated BP   . Diverticulosis   . H/O: GI bleed   . Acute renal insufficiency   . GERD (gastroesophageal reflux disease)   . Barrett's esophagus   . Hyperglycemia   . HSV-2 (herpes simplex virus 2) infection   . Abnormal finding on Pap smear, ASCUS   . Osteoporosis     worsening  . Amputation finger     index  . Hypertension    Past Surgical History  Procedure Laterality Date  . Partial hysterectomy    . Amputation finger / thumb      secondary to osteomyelitis  . Foot surgery      right  . Upper gastrointestinal endoscopy    . Colonoscopy     No Known Allergies Prior to Admission medications   Medication Sig Start Date End Date Taking? Authorizing Provider  aspirin 81 MG tablet Take 81 mg by mouth daily.      Historical Provider, MD  calcium carbonate (CALCIUM 500) 1250 MG tablet Take 3 tablets by mouth daily.     Historical Provider, MD  Cholecalciferol (VITAMIN D-3 PO) Take 4,500 mg by mouth daily.      Historical Provider, MD  conjugated estrogens (PREMARIN) vaginal cream Place 1 Applicatorful vaginally once a week.     Historical Provider, MD  cyclobenzaprine (FLEXERIL) 5 MG tablet Take one half to one tablet if needed for muscle spasm up to twice a day. 10/07/15   [name], MD  desonide (DESOWEN) 0.05 % cream Apply 1 application topically 2 (two) times daily as needed (dry skin).     Historical Provider, MD  ferrous sulfate 325 (65 FE) MG tablet Take 325 mg by mouth daily with breakfast.      Historical Provider, MD  gabapentin (NEURONTIN) 800 MG tablet TAKE 1  TABLE BY MOUTH AT BEDTIME 10/26/14   Chelle Jeffery, PA-C  hydrochlorothiazide (MICROZIDE) 12.5 MG capsule TAKE 1 CAPSULE BY MOUTH EVERY TUES AND SATURDAY 10/02/15   Ezekiel Slocumb, PA-C  HYDROcodone-acetaminophen (NORCO) 5-325 MG tablet Take 0.5-1 tablets by mouth every 4 (four) hours as needed for moderate pain. 09/29/15   Margarita Mail, PA-C  hydroxypropyl methylcellulose (ISOPTO TEARS) 2.5 % ophthalmic solution Place 1 drop into both eyes 2 (two) times daily.    Historical Provider, MD  lactose free nutrition (BOOST) LIQD Take 237 mLs by mouth daily.    Historical Provider, MD  metoprolol succinate (TOPROL-XL) 50 MG 24 hr tablet TAKE 1 TABLET BY MOUTH ONCE DAILY 09/22/15   Mancel Bale, PA-C  Multiple Vitamins-Minerals (HAIR/SKIN/NAILS PO) Take by mouth daily.      Historical Provider, MD  nystatin (MYCOSTATIN) cream Apply 1 application topically as needed (dry skin).     Historical Provider, MD  omeprazole (PRILOSEC) 40 MG capsule Take 1 capsule (40 mg total) by mouth daily. 11/03/14   [name], MD  promethazine (PHENERGAN) 25 MG tablet Take 1 tablet (25 mg total) by mouth every 6 (six) hours as needed for nausea or vomiting. 07/31/14   Laban Emperor Zehr, PA-C  traMADol-acetaminophen (ULTRACET) 37.5-325 MG tablet take 1 tablet by mouth every 12 hours if needed for pain 10/07/15   [name], MD  verapamil (CALAN-SR) 240 MG CR tablet Take 1 tablet (240 mg total) by mouth daily. 10/22/15   Jaynee Eagles, PA-C  vitamin C (ASCORBIC ACID) 500 MG tablet Take 500 mg by mouth daily.    Historical Provider, MD   Social History   Social History  . Marital Status: Single    Spouse Name: N/A  . Number of Children: 1  . Years of Education: N/A   Occupational History  .     Social History Main Topics  . Smoking status: Never Smoker   . Smokeless tobacco: Never Used  . Alcohol Use: No  . Drug Use: No  . Sexual Activity: Not on file   Other Topics Concern  . Not on file   Social History  Narrative        Review of Systems  Constitutional: Negative for fever and chills.  Eyes: Negative for pain, redness and itching.  Respiratory: Negative for cough and shortness of breath.   Gastrointestinal: Negative for nausea and vomiting.  Musculoskeletal: Positive for back pain.  Neurological: Negative for syncope and speech difficulty.       Objective:  Physical Exam Filed Vitals:   12/02/15 1142  BP: 151/76  Pulse: 91  Temp: 98.2 F (36.8 C)  TempSrc: Oral  Resp: 18  Height: _0  (1.499 m)  Weight: 105 lb (47.628 kg)  SpO2: 98%    CONSTITUTIONAL: Well developed/well nourished HEAD: Normocephalic/atraumatic EYES: EOMI/PERRL ENMT: Mucous membranes moist Spine: A severe kyphoscoliosis.   NECK: supple no meningeal signs CV: S1/S2 noted, no murmurs/rubs/gallops noted LUNGS: Lungs are clear to auscultation bilaterally, no apparent distress ABDOMEN: soft, nontender, no rebound or guarding, bowel sounds noted throughout abdomen NEURO: Pt is awake/alert/appropriate, moves all extremitiesx4.  No facial droop.   SKIN: warm, color normal PSYCH: no abnormalities of mood noted, alert and oriented to situation      Assessment & Plan:   Patient looks good. Urine had some bacteria and nitrites and was sent for culture. CBC and be met were done. Recheck 4 months. Weight is stable.I personally performed the services described in this documentation, which was scribed in my presence. The recorded information has been reviewed and is accurate Nena Jordan MD.

## 2015-12-02 NOTE — Telephone Encounter (Signed)
Pt wanted to let dr Everlene Farrier know she did fall and hit her head she wants to know if he needs to be referred for this

## 2015-12-03 NOTE — Telephone Encounter (Signed)
As long as patient feels well and is not having any neurological symptoms such as extremity weakness nausea or vomiting she can follow this. If she has any neurological symptoms she needs to go on to the emergency room to be evaluated.

## 2015-12-03 NOTE — Telephone Encounter (Signed)
Patient called, relayed Dr. Caren Griffins message.

## 2015-12-05 LAB — URINE CULTURE: Colony Count: >=100000

## 2015-12-07 ENCOUNTER — Telehealth: Payer: Self-pay

## 2015-12-07 MED ORDER — OMEPRAZOLE 40 MG PO CPDR: 40.0000 mg | DELAYED_RELEASE_CAPSULE | Freq: Every day | ORAL | Status: DC

## 2015-12-07 MED ORDER — AMOXICILLIN 500 MG PO TABS: 500.0000 mg | ORAL_TABLET | Freq: Two times a day (BID) | ORAL | Status: DC

## 2015-12-07 NOTE — Addendum Note (Signed)
Addended by: Constance Goltz on: 12/07/2015 09:49 AM   Modules accepted: Orders

## 2015-12-09 ENCOUNTER — Ambulatory Visit (INDEPENDENT_AMBULATORY_CARE_PROVIDER_SITE_OTHER): Payer: Medicare Other | Admitting: Gastroenterology

## 2015-12-09 ENCOUNTER — Encounter: Payer: Self-pay | Admitting: Gastroenterology

## 2015-12-09 ENCOUNTER — Other Ambulatory Visit: Payer: Self-pay | Admitting: Emergency Medicine

## 2015-12-09 VITALS — BP 126/70 | HR 64 | Ht 59.0 in | Wt 107.0 lb

## 2015-12-09 DIAGNOSIS — K219 Gastro-esophageal reflux disease without esophagitis: Secondary | ICD-10-CM | POA: Diagnosis not present

## 2015-12-09 DIAGNOSIS — R630 Anorexia: Secondary | ICD-10-CM | POA: Diagnosis not present

## 2015-12-09 NOTE — Patient Instructions (Signed)
It has been recommended to you by your physician that you have a(n) EGD completed. Per your request, we did not schedule the procedure(s) today. Please contact our office at [phone] should you decide to have the procedure completed.

## 2015-12-09 NOTE — Progress Notes (Signed)
HPI :  81 y/o female referred here for concern for weight loss. [name] is here to follow up, previously followed by Dr. Deatra Ina. [name] weighs 107 lbs today. Previously weighed 105 lbs when at [facility]. [name] weighed 99 lbs last year. Over the past 2 years [name] has weighed between 99 lbs and 112 lbs.   [name] is not eating too much at any one time but, [name] is eating meals on wheels. [name] reports [name] has poor appetite and is anxious. [name] has a son who is an alcoholic who lives with her. [name] reports [name] has a lot of stress in her life, and [name] thinks [name] does not eat well due to this. [name] denies nausea or vomiting. [name]  Has some occasional epigastric discomfort but for the past part [name] denies discomfort. [name] has a history of superficial gastric ulcers on EGD in 2014. [name] denies postprandial abdominal pains. [name] denies NSAIDs. [name] is otherwise taking omeprazole 40mg  once daily. [name] has a history of nondysplastic Barrett's from an irregular z-line. [name] states omeprazole works well for her heartburn at that dose. No dysphagia or odynophagia.  Other aspirin 81mg  [name] does not take other NSAIDs. [name] reports a history of cardiac disease. [name] reports her bowels are stable. [name] drinks boost, not needing to take her miralax routinely, only as needed. No blood in the stools.    Prior workup: EGD 2014 - small superficial gastric ulcers, short segment Barrett's - biopsies negative for H pylori Colonoscopy 2009 - diverticulosis CT abdomen 2015 - large hiatal hernia, no mass lesions   Past Medical History  Diagnosis Date  . Glaucoma   . Dementia   . Spondylosis   . Foot deformity   . Migraine   . CAD (coronary artery disease)   . Elevated BP   . Diverticulosis   . H/O: GI bleed   . Acute renal insufficiency   . GERD (gastroesophageal reflux disease)   . Barrett's esophagus   . Hyperglycemia   . HSV-2 (herpes simplex virus 2) infection   . Abnormal finding on Pap smear, ASCUS   . Osteoporosis     worsening  .  Amputation finger     index  . Hypertension      Past Surgical History  Procedure Laterality Date  . Partial hysterectomy    . Amputation finger / thumb      secondary to osteomyelitis  . Foot surgery      right  . Upper gastrointestinal endoscopy    . Colonoscopy     Family History  Problem Relation Age of Onset  . Breast cancer Mother   . Heart attack Mother   . Heart disease Father   . Alcohol abuse Son   . Colon cancer Neg Hx   . Stomach cancer Neg Hx   . Rectal cancer Neg Hx    Social History  Substance Use Topics  . Smoking status: Never Smoker   . Smokeless tobacco: Never Used  . Alcohol Use: No   Current Outpatient Prescriptions  Medication Sig Dispense Refill  . amoxicillin (AMOXIL) 500 MG tablet Take 1 tablet (500 mg total) by mouth 2 (two) times daily. 10 tablet 0  . aspirin 81 MG tablet Take 81 mg by mouth daily.      . calcium carbonate (CALCIUM 500) 1250 MG tablet Take 3 tablets by mouth daily.     . Cholecalciferol (VITAMIN D-3 PO) Take 4,500 mg by mouth daily.      Marland Kitchen  conjugated estrogens (PREMARIN) vaginal cream Place 1 Applicatorful vaginally once a week.     . cyclobenzaprine (FLEXERIL) 5 MG tablet Take one half to one tablet if needed for muscle spasm up to twice a day. 20 tablet 3  . desonide (DESOWEN) 0.05 % cream Apply 1 application topically 2 (two) times daily as needed (dry skin).     . ferrous sulfate 325 (65 FE) MG tablet Take 325 mg by mouth daily with breakfast.      . gabapentin (NEURONTIN) 800 MG tablet TAKE 1 TABLE BY MOUTH AT BEDTIME 90 tablet 0  . hydrochlorothiazide (MICROZIDE) 12.5 MG capsule TAKE 1 CAPSULE BY MOUTH EVERY TUES AND SATURDAY 27 capsule 0  . HYDROcodone-acetaminophen (NORCO) 5-325 MG tablet Take 0.5-1 tablets by mouth every 4 (four) hours as needed for moderate pain. 10 tablet 0  . hydroxypropyl methylcellulose (ISOPTO TEARS) 2.5 % ophthalmic solution Place 1 drop into both eyes 2 (two) times daily.    Marland Kitchen lactose free  nutrition (BOOST) LIQD Take 237 mLs by mouth daily.    . metoprolol succinate (TOPROL-XL) 50 MG 24 hr tablet TAKE 1 TABLET BY MOUTH ONCE DAILY 30 tablet 2  . Multiple Vitamins-Minerals (HAIR/SKIN/NAILS PO) Take by mouth daily.      Marland Kitchen nystatin (MYCOSTATIN) cream Apply 1 application topically as needed (dry skin).     Marland Kitchen omeprazole (PRILOSEC) 40 MG capsule Take 1 capsule (40 mg total) by mouth daily. 30 capsule 3  . promethazine (PHENERGAN) 25 MG tablet Take 1 tablet (25 mg total) by mouth every 6 (six) hours as needed for nausea or vomiting. 30 tablet 6  . traMADol-acetaminophen (ULTRACET) 37.5-325 MG tablet take 1 tablet by mouth every 12 hours if needed for pain 60 tablet 2  . verapamil (CALAN-SR) 240 MG CR tablet Take 1 tablet (240 mg total) by mouth daily. 90 tablet 1  . vitamin C (ASCORBIC ACID) 500 MG tablet Take 500 mg by mouth daily.     No current facility-administered medications for this visit.   No Known Allergies   Review of Systems: All systems reviewed and negative except where noted in HPI.   Lab Results  Component Value Date   WBC 5.2 12/02/2015   HGB 11.9* 12/02/2015   HCT 37.1 12/02/2015   MCV 85.9 12/02/2015   PLT 440* 12/02/2015    Lab Results  Component Value Date   ALT 11 04/06/2015   AST 15 04/06/2015   ALKPHOS 46 04/06/2015   BILITOT 0.4 04/06/2015    Lab Results  Component Value Date   CREATININE 0.82 12/02/2015   BUN 19 12/02/2015   NA 146 12/02/2015   K 4.8 12/02/2015   CL 107 12/02/2015   CO2 32* 12/02/2015   CT abdomen 2015 - large hiatal hernia  Physical Exam: BP 126/70 mmHg  Pulse 64  Ht 4\' 11"  (1.499 m)  Wt 107 lb (48.535 kg)  BMI 21.60 kg/m2 Constitutional: Pleasant, female in no acute distress, severe scoliosis noted HEENT: Normocephalic and atraumatic. Conjunctivae are normal. No scleral icterus. Neck supple.  Cardiovascular: Normal rate, regular rhythm.  Pulmonary/chest: Effort normal and breath sounds normal. No wheezing,  rales or rhonchi. Abdominal: Soft, nondistended, nontender. Bowel sounds active throughout. There are no masses palpable. No hepatomegaly. Extremities: no edema Lymphadenopathy: No cervical adenopathy noted. Neurological: Alert and oriented to person place and time. Skin: Skin is warm and dry. No rashes noted. Psychiatric: Normal mood and affect. Behavior is normal.   ASSESSMENT AND PLAN: 81 y/o  female with history of GERD / Barrett's, and chronic underweight, here for follow up.   Her weight is actually higher today than it has been in the past year, 107 lbs today and has ranged from 99 to 112 over the past 2 years. [name] has not had any progressive weight loss. [name] has a poor appetite but [name] thinks this is due to significant stress at home, [name] lives with son who is an alcoholic and this worries her significantly. Unclear if her appetite is due to anxiety or other factors. [name] does have a history of superficial gastric ulcers on last EGD with a short segment of nondysplastic Barrett's. [name] takes 40mg  prilosec daily which controls her reflux quite well. Her Barrett's is a short segment and very low risk for malignancy although I offered her an EGD to re-evaluate this and her stomach to ensure healing of previously noted ulcers in light of her symptoms, however [name] wishes to think about it and avoid procedures if possible. I provided her my contact information and asked that [name] get back to me about this. Otherwise, [name] should continue her PPI and avoid NSAIDs. [name] has no anemia. Her colonoscopy is up to date without polyps, and prior CT abdomen last year did not show pathology to account for her low weight, no evidence of malignancy noted, only large hiatal hernia, and reassured her in this light.   [name] will get back to me if [name] wishes to proceed with EGD, otherwise follow up as needed for new or worsening symptoms.   Jesup Cellar, MD [facility] Pager [phone]

## 2015-12-10 ENCOUNTER — Telehealth: Payer: Self-pay

## 2015-12-10 NOTE — Telephone Encounter (Signed)
Patient called in to let Dr Everlene Farrier know that she went to see Dr. Havery Moros he told her that she needs to have an Endoscopy and she wants Dr Perfecto Kingdom opinion on this before she agrees to it. Also she wanted to let him know that she has gain 2 lbs she now weighs 107lbs.  Can someone please call her and let her know. Thank you!

## 2015-12-11 NOTE — Telephone Encounter (Signed)
Call [name]. I think it would be a good idea to have the endoscopy to be sure there is no other reason she is having difficulty with her eating and weight.

## 2015-12-12 NOTE — Telephone Encounter (Signed)
Dr. Everlene Farrier,  Can we refill gabapentin?

## 2015-12-15 ENCOUNTER — Other Ambulatory Visit: Payer: Self-pay | Admitting: Physician Assistant

## 2015-12-15 ENCOUNTER — Encounter: Payer: Self-pay | Admitting: Gastroenterology

## 2015-12-15 ENCOUNTER — Telehealth: Payer: Self-pay

## 2015-12-15 NOTE — Telephone Encounter (Signed)
Pt advised.

## 2015-12-15 NOTE — Telephone Encounter (Signed)
Dr. Everlene Farrier, patient would like to know what she can do to increase her appetite. She mostly eats sweet potato pie and potato salad. She feels good, not in any pain. She just wants to eat more. Also, her endoscopy is scheduled for January 23 at 0900. She has to make sure she has a ride so she can confirm her appt. Cb# [phone].

## 2015-12-16 ENCOUNTER — Telehealth: Payer: Self-pay

## 2015-12-16 ENCOUNTER — Other Ambulatory Visit: Payer: Self-pay | Admitting: Physician Assistant

## 2015-12-16 NOTE — Telephone Encounter (Signed)
Before she takes any medications to stimulate her appetite is make sure her endoscopy is normal. It is very important she go ahead and have this done.

## 2015-12-16 NOTE — Telephone Encounter (Signed)
Rx sent today.

## 2015-12-16 NOTE — Telephone Encounter (Signed)
Pt would like a refill on her metoprolol succinate (TOPROL-XL) 50 MG 24 hr tablet [id number] script. Pharmacy is good. CB # [phone]

## 2015-12-17 NOTE — Telephone Encounter (Signed)
Pt advised.

## 2015-12-27 ENCOUNTER — Telehealth: Payer: Self-pay

## 2015-12-27 NOTE — Telephone Encounter (Signed)
Call patient to force fluids and take Imodium as needed for diarrhea. please get more information regarding her symptoms.Marland Kitchen

## 2015-12-27 NOTE — Telephone Encounter (Signed)
Pt states she have a stomach virus and can't come to the Dr today but would like to have something called in for her. Please call pt at [place]

## 2015-12-29 NOTE — Telephone Encounter (Signed)
Left message for pt to call back  °

## 2015-12-29 NOTE — Telephone Encounter (Signed)
Spoke with pt, she is feeling better and felt like she needed to rest.

## 2016-01-04 ENCOUNTER — Other Ambulatory Visit: Payer: Self-pay | Admitting: Gastroenterology

## 2016-01-04 NOTE — Telephone Encounter (Signed)
Is this ok to refill?  

## 2016-01-05 NOTE — Telephone Encounter (Signed)
You can refill it but how frequently is she taking this? 25mg  every 6 hours is too much, recommend ordering it for BID PRN. Thanks

## 2016-01-06 ENCOUNTER — Ambulatory Visit (AMBULATORY_SURGERY_CENTER): Payer: Self-pay

## 2016-01-06 VITALS — Ht 59.0 in | Wt 104.0 lb

## 2016-01-06 DIAGNOSIS — K2270 Barrett's esophagus without dysplasia: Secondary | ICD-10-CM

## 2016-01-06 NOTE — Progress Notes (Signed)
No allergies to eggs or soy No home oxygen No past problems with anesthesia No diet/weight loss meds  No internet  Pt very anxious about anything on her face/ in nose.  Please wait til the last minute to put O2 on if pt mentions it.

## 2016-01-17 ENCOUNTER — Encounter: Payer: Self-pay | Admitting: Gastroenterology

## 2016-01-17 ENCOUNTER — Ambulatory Visit (AMBULATORY_SURGERY_CENTER): Payer: Medicare Other | Admitting: Gastroenterology

## 2016-01-17 VITALS — BP 115/48 | HR 73 | Temp 98.2°F | Resp 16 | Ht 59.0 in | Wt 104.0 lb

## 2016-01-17 DIAGNOSIS — K3189 Other diseases of stomach and duodenum: Secondary | ICD-10-CM | POA: Diagnosis not present

## 2016-01-17 DIAGNOSIS — K259 Gastric ulcer, unspecified as acute or chronic, without hemorrhage or perforation: Secondary | ICD-10-CM

## 2016-01-17 DIAGNOSIS — K2270 Barrett's esophagus without dysplasia: Secondary | ICD-10-CM | POA: Diagnosis not present

## 2016-01-17 DIAGNOSIS — I2510 Atherosclerotic heart disease of native coronary artery without angina pectoris: Secondary | ICD-10-CM | POA: Diagnosis not present

## 2016-01-17 MED ORDER — SODIUM CHLORIDE 0.9 % IV SOLN: 500.0000 mL | INTRAVENOUS | Status: DC

## 2016-01-17 NOTE — Progress Notes (Signed)
Patient denies any allergies to eggs or soy. 

## 2016-01-17 NOTE — Progress Notes (Signed)
Called to room to assist during endoscopic procedure.  Patient ID and intended procedure confirmed with present staff. Received instructions for my participation in the procedure from the performing physician.  

## 2016-01-17 NOTE — Op Note (Signed)
[place]  [address] [place], [id number]   ENDOSCOPY PROCEDURE REPORT  PATIENT: [name], [name]  MR#: [id number] BIRTHDATE: 06-07-1933 , 82  yrs. old GENDER: female ENDOSCOPIST: Yetta Flock, MD REFERRED BY: PROCEDURE DATE:  01/17/2016 PROCEDURE:  EGD w/ biopsy ASA CLASS:     Class II INDICATIONS:  poor appetite, underweight, history of gastric ulcers, history of Barrett's. MEDICATIONS: Propofol 80 mg IV TOPICAL ANESTHETIC:  DESCRIPTION OF PROCEDURE: After the risks benefits and alternatives of the procedure were thoroughly explained, informed consent was obtained.  The [id number] endoscope was introduced through the mouth and advanced to the second portion of the duodenum , Without limitations.  The instrument was slowly withdrawn as the mucosa was fully examined.  FINDINGS: The esophagus was normal in appearance.  The GEJ was irregular with upwards of a 1cm circumferential extension of salmon colored mucosa concerning for Barrett's (C1M1).  No nodularity was appreciated.  Biopsies were obtained to rule out dysplasia.  There was a 7cm hiatal hernia (DH noted at 36cm from the incisors, with GEJ / SCJ noted at 29cm from the incisors).  There was a small superficial erosion / ulceration (few mm in size) that was clean based just inferior to the hernia and could represent a Cameron lesion.  The remainder of the stomach was normal.  Biopsies taken of the antrum and body to rule out H pylori.  The duodenal bulb and 2nd portion of the duodenum were normal in appearance.  Retroflexed views revealed a hiatal hernia.     The scope was then withdrawn from the patient and the procedure completed.  COMPLICATIONS: There were no immediate complications.  ENDOSCOPIC IMPRESSION: Short segment of suspected Barrett's esophagus, biopsies taken to rule out dysplasia 7cm hiatal hernia Superficial proximal gastric ulceration, ? Cameron lesion Normal  remainder of examined stomach and duodenum - biopsies taken to rule out H pylori  RECOMMENDATIONS: Await pathology results Resume diet Resume medications Increase omeprazole to twice daily dosing for 2 weeks, then decrease to once daily in light of superficial ulceration Avoid NSAIDs   eSigned:  Yetta Flock, MD 2016-01-17 10:55:[id number]    CC: the patient  PATIENT NAME:  [name], [name] MR#: [id number]

## 2016-01-17 NOTE — Progress Notes (Signed)
A/ox3 pleased with MAC, report to Karen RN 

## 2016-01-17 NOTE — Progress Notes (Signed)
PATIENT VERBALIZING UNDERSTANDING OF DISCHARGE INSTRUCTIONS PER DR. ARMBRUSTER AND THIS WRITER.

## 2016-01-17 NOTE — Patient Instructions (Signed)
INCREASE YOUR OMEPRAZOLE TO TWICE DAILY  UNTIL January 31, 2016,THEN DECREASE AGAIN TO ONCE DAILY.  AVOID NSAIDS (ADVIL, MOTRIN , ALEVE, IBUPROFEN ETC)      YOU HAD AN ENDOSCOPIC PROCEDURE TODAY AT [facility]:   Refer to the procedure report that was given to you for any specific questions about what was found during the examination.  If the procedure report does not answer your questions, please call your gastroenterologist to clarify.  If you requested that your care partner not be given the details of your procedure findings, then the procedure report has been included in a sealed envelope for you to review at your convenience later.  YOU SHOULD EXPECT: Some feelings of bloating in the abdomen. Passage of more gas than usual.  Walking can help get rid of the air that was put into your GI tract during the procedure and reduce the bloating. If you had a lower endoscopy (such as a colonoscopy or flexible sigmoidoscopy) you may notice spotting of blood in your stool or on the toilet paper. If you underwent a bowel prep for your procedure, you may not have a normal bowel movement for a few days.  Please Note:  You might notice some irritation and congestion in your nose or some drainage.  This is from the oxygen used during your procedure.  There is no need for concern and it should clear up in a day or so.  SYMPTOMS TO REPORT IMMEDIATELY:    Following upper endoscopy (EGD)  Vomiting of blood or coffee ground material  New chest pain or pain under the shoulder blades  Painful or persistently difficult swallowing  New shortness of breath  Fever of 100F or higher  Black, tarry-looking stools  For urgent or emergent issues, a gastroenterologist can be reached at any hour by calling [phone].   DIET: Your first meal following the procedure should be a small meal and then it is ok to progress to your normal diet. Heavy or fried foods are harder to digest and may make  you feel nauseous or bloated.  Likewise, meals heavy in dairy and vegetables can increase bloating.  Drink plenty of fluids but you should avoid alcoholic beverages for 24 hours.  ACTIVITY:  You should plan to take it easy for the rest of today and you should NOT DRIVE or use heavy machinery until tomorrow (because of the sedation medicines used during the test).    FOLLOW UP: Our staff will call the number listed on your records the next business day following your procedure to check on you and address any questions or concerns that you may have regarding the information given to you following your procedure. If we do not reach you, we will leave a message.  However, if you are feeling well and you are not experiencing any problems, there is no need to return our call.  We will assume that you have returned to your regular daily activities without incident.  If any biopsies were taken you will be contacted by phone or by letter within the next 1-3 weeks.  Please call us at [phone] if you have not heard about the biopsies in 3 weeks.    SIGNATURES/CONFIDENTIALITY: You and/or your care partner have signed paperwork which will be entered into your electronic medical record.  These signatures attest to the fact that that the information above on your After Visit Summary has been reviewed and is understood.  Full responsibility of the confidentiality  of this discharge information lies with you and/or your care-partner.

## 2016-01-18 ENCOUNTER — Telehealth: Payer: Self-pay

## 2016-01-18 NOTE — Telephone Encounter (Signed)
  Follow up Call-  Call back number 01/17/2016 06/17/2013  Post procedure Call Back phone  # [phone]  Permission to leave phone message Yes Yes     Patient questions:  Do you have a fever, pain , or abdominal swelling? No. Pain Score  0 *  Have you tolerated food without any problems? Yes.    Have you been able to return to your normal activities? Yes.    Do you have any questions about your discharge instructions: Diet   No. Medications  No. Follow up visit  No.  Do you have questions or concerns about your Care? No.  Actions: * If pain score is 4 or above: No action needed, pain <4.

## 2016-01-20 DIAGNOSIS — H401111 Primary open-angle glaucoma, right eye, mild stage: Secondary | ICD-10-CM | POA: Diagnosis not present

## 2016-01-20 DIAGNOSIS — H25813 Combined forms of age-related cataract, bilateral: Secondary | ICD-10-CM | POA: Diagnosis not present

## 2016-01-20 DIAGNOSIS — H401122 Primary open-angle glaucoma, left eye, moderate stage: Secondary | ICD-10-CM | POA: Diagnosis not present

## 2016-01-26 ENCOUNTER — Telehealth: Payer: Self-pay | Admitting: Gastroenterology

## 2016-01-26 MED ORDER — OMEPRAZOLE 40 MG PO CPDR: 40.0000 mg | DELAYED_RELEASE_CAPSULE | Freq: Every day | ORAL | Status: DC

## 2016-01-26 NOTE — Telephone Encounter (Signed)
Sent!

## 2016-01-27 ENCOUNTER — Telehealth: Payer: Self-pay | Admitting: Gastroenterology

## 2016-01-27 MED ORDER — OMEPRAZOLE 40 MG PO CPDR: DELAYED_RELEASE_CAPSULE | ORAL | Status: DC

## 2016-01-27 NOTE — Telephone Encounter (Signed)
Pt called back. She states her pharmacy will not refill omeprazole early for her. I called her pharmacy and was instructed to send over omeprazole with the new instructions. The rx was sent.

## 2016-01-27 NOTE — Telephone Encounter (Signed)
Called pt back. Phone busy.

## 2016-02-17 ENCOUNTER — Telehealth: Payer: Self-pay

## 2016-02-17 NOTE — Telephone Encounter (Signed)
Pt states that she is taking a lot of vitamins and take boost with extra protein and she wants to know if this is too much together please call patient  Best number [phone]

## 2016-02-22 ENCOUNTER — Telehealth: Payer: Self-pay | Admitting: Emergency Medicine

## 2016-02-22 NOTE — Telephone Encounter (Signed)
Call Berenice Primas and tell her she just needs to take a Centrum Silver 1 a day along with her boost.

## 2016-02-23 NOTE — Telephone Encounter (Signed)
Advised pt

## 2016-03-09 ENCOUNTER — Other Ambulatory Visit: Payer: Self-pay | Admitting: Emergency Medicine

## 2016-03-10 ENCOUNTER — Telehealth: Payer: Self-pay

## 2016-03-10 NOTE — Telephone Encounter (Signed)
Pt is needing to talk with someone about getting an rx for boost drinks and not the ensure -this would be cheaper for you that way  Best number [phone]

## 2016-03-10 NOTE — Telephone Encounter (Signed)
Can we prescribe this?

## 2016-03-11 NOTE — Telephone Encounter (Signed)
We certainly can print out a prescription for her to take 2-3 boost or ensure per day. I do not know if her insurance will cover this. Her diagnosis is weight loss and failure to thrive.

## 2016-03-13 NOTE — Telephone Encounter (Signed)
Dr. Everlene Farrier, [facility] states they cannot bill for Boost drinks so insurance will n ot pay.

## 2016-03-14 DIAGNOSIS — Z124 Encounter for screening for malignant neoplasm of cervix: Secondary | ICD-10-CM | POA: Diagnosis not present

## 2016-03-14 DIAGNOSIS — N952 Postmenopausal atrophic vaginitis: Secondary | ICD-10-CM | POA: Diagnosis not present

## 2016-03-21 ENCOUNTER — Telehealth: Payer: Self-pay

## 2016-03-21 NOTE — Telephone Encounter (Signed)
Please write a prescription for patient to receive2boost per day.  prescription for #60 bottles refill for 1 year

## 2016-03-21 NOTE — Telephone Encounter (Signed)
Catrice who is an Aide with pt states [facility] will pay for a case of Boost if the Dr would write a prescription for it, will save a lot of money for her, the Aide will come And pick up the prescription once the Dr okay it. Please call [phone]

## 2016-03-22 ENCOUNTER — Encounter: Payer: Self-pay | Admitting: Gastroenterology

## 2016-03-22 MED ORDER — BOOST PO LIQD: 1.0000 | Freq: Two times a day (BID) | ORAL | Status: DC

## 2016-03-22 NOTE — Telephone Encounter (Signed)
Notified pt's son that Rx is ready. He will have Catrice come by and p/up.

## 2016-03-30 ENCOUNTER — Encounter: Payer: Self-pay | Admitting: Emergency Medicine

## 2016-03-30 ENCOUNTER — Ambulatory Visit (INDEPENDENT_AMBULATORY_CARE_PROVIDER_SITE_OTHER): Payer: Medicare Other | Admitting: Emergency Medicine

## 2016-03-30 ENCOUNTER — Other Ambulatory Visit: Payer: Self-pay | Admitting: *Deleted

## 2016-03-30 ENCOUNTER — Other Ambulatory Visit: Payer: Self-pay | Admitting: Emergency Medicine

## 2016-03-30 VITALS — BP 124/74 | HR 79 | Temp 98.2°F | Resp 16 | Ht <= 58 in | Wt 109.0 lb

## 2016-03-30 DIAGNOSIS — R8290 Unspecified abnormal findings in urine: Secondary | ICD-10-CM

## 2016-03-30 DIAGNOSIS — N898 Other specified noninflammatory disorders of vagina: Secondary | ICD-10-CM

## 2016-03-30 DIAGNOSIS — L298 Other pruritus: Secondary | ICD-10-CM | POA: Diagnosis not present

## 2016-03-30 DIAGNOSIS — R634 Abnormal weight loss: Secondary | ICD-10-CM

## 2016-03-30 DIAGNOSIS — I10 Essential (primary) hypertension: Secondary | ICD-10-CM

## 2016-03-30 DIAGNOSIS — R54 Age-related physical debility: Secondary | ICD-10-CM

## 2016-03-30 DIAGNOSIS — E2839 Other primary ovarian failure: Secondary | ICD-10-CM | POA: Diagnosis not present

## 2016-03-30 DIAGNOSIS — R8299 Other abnormal findings in urine: Secondary | ICD-10-CM | POA: Diagnosis not present

## 2016-03-30 DIAGNOSIS — M4120 Other idiopathic scoliosis, site unspecified: Secondary | ICD-10-CM

## 2016-03-30 LAB — POCT URINALYSIS DIP (MANUAL ENTRY)
Bilirubin, UA: NEGATIVE
Glucose, UA: NEGATIVE
Ketones, POC UA: NEGATIVE
Leukocytes, UA: NEGATIVE
Nitrite, UA: POSITIVE — AB
PH UA: 7
PROTEIN UA: NEGATIVE
RBC UA: NEGATIVE
SPEC GRAV UA: 1.01
UROBILINOGEN UA: 0.2

## 2016-03-30 LAB — POC MICROSCOPIC URINALYSIS (UMFC): MUCUS RE: ABSENT

## 2016-03-30 NOTE — Patient Instructions (Signed)
     IF you received an x-ray today, you will receive an invoice from [facility]. Please contact  Radiology at [phone] with questions or concerns regarding your invoice.   IF you received labwork today, you will receive an invoice from [facility]. Please contact [facility] at [phone] with questions or concerns regarding your invoice.   Our billing staff will not be able to assist you with questions regarding bills from these companies.  You will be contacted with the lab results as soon as they are available. The fastest way to get your results is to activate your My Chart account. Instructions are located on the last page of this paperwork. If you have not heard from us regarding the results in 2 weeks, please contact this office.      

## 2016-03-30 NOTE — Progress Notes (Signed)
Patient ID: [name], female   DOB: [birth year], 81 y.o.   MRN: [id number]    By signing my name below, I, Essence Howell, attest that this documentation has been prepared under the direction and in the presence of [name], MD Electronically Signed: Ladene Artist, ED Scribe 03/30/2016 at 12:19 PM.  Chief Complaint:  Chief Complaint  Patient presents with  . Follow-up    4 month - BP    HPI: [name] is a 82 y.o. female, with a h/o HTN, who reports to [facility] today for a 4 month follow-up regarding HTN. Triage BP: 124/74  Weight Pt states that she has gained a few lbs since she was last seen. [employer] is still delivering meals to her x5 days/week.  Wt Readings from Last 3 Encounters:  03/30/16 109 lb (49.442 kg)  01/17/16 104 lb (47.174 kg)  01/06/16 104 lb (47.174 kg)   Back Pain Pt reports chronic back pain that is worsened with movement. She has a h/o scoliosis.   Right Foot Pain Pt reports constant right foot pain, specifically near her heel, for the past few days. Pain is worsened with bearing weight. No treatments tried PTA.   L Ear Pt reports decreased hearing in her left ear for the past few days. She suspects a cerumen impaction.   Vaginal Itching Pt also presents with persistent vaginal itching for several days. She denies dysuria. Pt was recently seen by her GYN and started on a cream. No dysuria   Past Medical History  Diagnosis Date  . Glaucoma   . Dementia   . Spondylosis   . Foot deformity   . Migraine   . CAD (coronary artery disease)   . Elevated BP   . Diverticulosis   . H/O: GI bleed   . Acute renal insufficiency   . GERD (gastroesophageal reflux disease)   . Barrett's esophagus   . Hyperglycemia   . HSV-2 (herpes simplex virus 2) infection   . Abnormal finding on Pap smear, ASCUS   . Osteoporosis     worsening  . Amputation finger     index  . Hypertension    Past Surgical History  Procedure Laterality Date  . Partial  hysterectomy    . Amputation finger / thumb      secondary to osteomyelitis  . Foot surgery      right  . Upper gastrointestinal endoscopy    . Colonoscopy     Social History   Social History  . Marital Status: Single    Spouse Name: N/A  . Number of Children: 1  . Years of Education: N/A   Occupational History  .     Social History Main Topics  . Smoking status: Never Smoker   . Smokeless tobacco: Never Used  . Alcohol Use: No  . Drug Use: No  . Sexual Activity: Not Asked   Other Topics Concern  . None   Social History Narrative   Family History  Problem Relation Age of Onset  . Breast cancer Mother   . Heart attack Mother   . Heart disease Father   . Alcohol abuse Son   . Colon cancer Neg Hx   . Stomach cancer Neg Hx   . Rectal cancer Neg Hx    Not on File Prior to Admission medications   Medication Sig Start Date End Date Taking? Authorizing Provider  aspirin 81 MG tablet Take 81 mg by mouth daily.  Historical Provider, MD  calcium carbonate (CALCIUM 500) 1250 MG tablet Take 3 tablets by mouth daily.     Historical Provider, MD  Cholecalciferol (VITAMIN D-3 PO) Take 4,500 mg by mouth daily.      Historical Provider, MD  conjugated estrogens (PREMARIN) vaginal cream Place 1 Applicatorful vaginally once a week.     Historical Provider, MD  cyclobenzaprine (FLEXERIL) 5 MG tablet Take one half to one tablet if needed for muscle spasm up to twice a day. 10/07/15   [name], MD  desonide (DESOWEN) 0.05 % cream Apply 1 application topically 2 (two) times daily as needed (dry skin).     Historical Provider, MD  ferrous sulfate 325 (65 FE) MG tablet Take 325 mg by mouth daily with breakfast.      Historical Provider, MD  gabapentin (NEURONTIN) 800 MG tablet take 1 tablet by mouth at bedtime 03/10/16   [name], MD  hydrochlorothiazide (MICROZIDE) 12.5 MG capsule TAKE ONE CAPSULE BY MOUTH EVERY TUESDAY AND SATURDAY 12/17/15   [name], MD    HYDROcodone-acetaminophen (NORCO) 5-325 MG tablet Take 0.5-1 tablets by mouth every 4 (four) hours as needed for moderate pain. 09/29/15   Margarita Mail, PA-C  hydroxypropyl methylcellulose (ISOPTO TEARS) 2.5 % ophthalmic solution Place 1 drop into both eyes 2 (two) times daily.    Historical Provider, MD  lactose free nutrition (BOOST) LIQD Take 237 mLs by mouth 2 (two) times daily between meals. 03/22/16   [name], MD  metoprolol succinate (TOPROL-XL) 50 MG 24 hr tablet take 1 tablet by mouth once daily 12/16/15   [name], MD  Multiple Vitamins-Minerals (HAIR/SKIN/NAILS PO) Take by mouth daily.      Historical Provider, MD  nystatin (MYCOSTATIN) cream Apply 1 application topically as needed (dry skin).     Historical Provider, MD  omeprazole (PRILOSEC) 40 MG capsule Take 1 capsule twice a day for 2 weeks, then 1 capsule daily. 01/27/16   Manus Gunning, MD  promethazine (PHENERGAN) 25 MG tablet Take 1 tablet (25 mg total) by mouth 2 (two) times daily as needed for nausea or vomiting. 01/05/16   Manus Gunning, MD  traMADol-acetaminophen [facility]) 37.5-325 MG tablet take 1 tablet by mouth every 12 hours if needed for pain 10/07/15   [name], MD  verapamil (CALAN-SR) 240 MG CR tablet Take 1 tablet (240 mg total) by mouth daily. 10/22/15   Jaynee Eagles, PA-C  vitamin C (ASCORBIC ACID) 500 MG tablet Take 500 mg by mouth daily.    Historical Provider, MD   ROS: The patient denies fevers, chills, night sweats, unintentional weight loss, chest pain, palpitations, wheezing, dyspnea on exertion, nausea, vomiting, abdominal pain, -dysuria, hematuria, melena, numbness, weakness, or tingling. +hearing loss, +back pain, +arthralgias, +vaginal itching  All other systems have been reviewed and were otherwise negative with the exception of those mentioned in the HPI and as above.    PHYSICAL EXAM: Filed Vitals:   03/30/16 1149 03/30/16 1156  BP: 145/78 124/74  Pulse: 79   Temp:  98.2 F (36.8 C)   Resp: 16    Body mass index is 22.97 kg/(m^2).  General: Alert, no acute distress HEENT:  Normocephalic, atraumatic, oropharynx patent. Eye: [facility] Cardiovascular: Regular rate and rhythm, no rubs murmurs or gallops. No Carotid bruits, radial pulse intact. No pedal edema.  Respiratory: Clear to auscultation bilaterally. No wheezes, rales, or rhonchi. No cyanosis, no use of accessory musculature Abdominal: No organomegaly,  abdomen is soft and non-tender, positive bowel sounds. No masses. Musculoskeletal: Severe kyphoscoliosis with back deformity. Gait intact. No edema. Skin: No rashes. Neurologic: Facial musculature symmetric. Psychiatric: Patient acts appropriately throughout our interaction. Lymphatic: No cervical or submandibular lymphadenopathy  LABS:  EKG/XRAY:   Primary read interpreted by Dr. Everlene Farrier at [facility].  ASSESSMENT/PLAN: Weight is up 6 pounds. She is currently under the treatment of Dr. Genia Harold GYN. She has wax in her right ear removed by irrigation. She was given the names of physicians: at  [employer] and [facility].I personally performed the services described in this documentation, which was scribed in my presence. The recorded information has been reviewed and is accurate.    Gross sideeffects, risk and benefits, and alternatives of medications d/w patient. Patient is aware that all medications have potential sideeffects and we are unable to predict every sideeffect or drug-drug interaction that may occur.  [name] 03/30/2016 12:07 PM

## 2016-03-30 NOTE — Addendum Note (Signed)
Addended by: Elie Confer on: 03/30/2016 05:26 PM   Modules accepted: Orders

## 2016-04-01 LAB — URINE CULTURE: Colony Count: >=100000

## 2016-04-10 ENCOUNTER — Emergency Department (HOSPITAL_COMMUNITY): Payer: Medicare Other

## 2016-04-10 ENCOUNTER — Emergency Department (HOSPITAL_COMMUNITY)
Admission: EM | Admit: 2016-04-10 | Discharge: 2016-04-10 | Disposition: A | Discharge status: Home / Self Care | Payer: Medicare Other | Attending: Emergency Medicine | Admitting: Emergency Medicine

## 2016-04-10 ENCOUNTER — Encounter (HOSPITAL_COMMUNITY): Payer: Self-pay | Admitting: *Deleted

## 2016-04-10 DIAGNOSIS — Z79899 Other long term (current) drug therapy: Secondary | ICD-10-CM | POA: Diagnosis not present

## 2016-04-10 DIAGNOSIS — I2510 Atherosclerotic heart disease of native coronary artery without angina pectoris: Secondary | ICD-10-CM | POA: Insufficient documentation

## 2016-04-10 DIAGNOSIS — Z7982 Long term (current) use of aspirin: Secondary | ICD-10-CM | POA: Diagnosis not present

## 2016-04-10 DIAGNOSIS — M25551 Pain in right hip: Secondary | ICD-10-CM | POA: Insufficient documentation

## 2016-04-10 DIAGNOSIS — I10 Essential (primary) hypertension: Secondary | ICD-10-CM | POA: Insufficient documentation

## 2016-04-10 DIAGNOSIS — K219 Gastro-esophageal reflux disease without esophagitis: Secondary | ICD-10-CM | POA: Diagnosis not present

## 2016-04-10 DIAGNOSIS — G43909 Migraine, unspecified, not intractable, without status migrainosus: Secondary | ICD-10-CM | POA: Insufficient documentation

## 2016-04-10 DIAGNOSIS — M79671 Pain in right foot: Secondary | ICD-10-CM | POA: Diagnosis not present

## 2016-04-10 DIAGNOSIS — F0390 Unspecified dementia without behavioral disturbance: Secondary | ICD-10-CM | POA: Insufficient documentation

## 2016-04-10 MED ORDER — TRAMADOL-ACETAMINOPHEN 37.5-325 MG PO TABS: ORAL_TABLET | ORAL | Status: DC

## 2016-04-10 MED ORDER — IBUPROFEN 400 MG PO TABS
400.0000 mg | ORAL_TABLET | Freq: Once | ORAL | Status: AC
  Administered 2016-04-10: 400 mg via ORAL
  Filled 2016-04-10: qty 1

## 2016-04-10 NOTE — Discharge Instructions (Signed)
Xrays dont show any fractures. You are able to walk, albeit with pain, so the next step will be or you to see your primary care doctor, as you might need physical therapy or specialist appointment.   Hip Pain Your hip is the joint between your upper legs and your lower pelvis. The bones, cartilage, tendons, and muscles of your hip joint perform a lot of work each day supporting your body weight and allowing you to move around. Hip pain can range from a minor ache to severe pain in one or both of your hips. Pain may be felt on the inside of the hip joint near the groin, or the outside near the buttocks and upper thigh. You may have swelling or stiffness as well.  HOME CARE INSTRUCTIONS   Take medicines only as directed by your health care provider.  Apply ice to the injured area:  Put ice in a plastic bag.  Place a towel between your skin and the bag.  Leave the ice on for 15-20 minutes at a time, 3-4 times a day.  Keep your leg raised (elevated) when possible to lessen swelling.  Avoid activities that cause pain.  Follow specific exercises as directed by your health care provider.  Sleep with a pillow between your legs on your most comfortable side.  Record how often you have hip pain, the location of the pain, and what it feels like. SEEK MEDICAL CARE IF:   You are unable to put weight on your leg.  Your hip is red or swollen or very tender to touch.  Your pain or swelling continues or worsens after 1 week.  You have increasing difficulty walking.  You have a fever. SEEK IMMEDIATE MEDICAL CARE IF:   You have fallen.  You have a sudden increase in pain and swelling in your hip. MAKE SURE YOU:   Understand these instructions.  Will watch your condition.  Will get help right away if you are not doing well or get worse.   This information is not intended to replace advice given to you by your health care provider. Make sure you discuss any questions you have with your  health care provider.   Document Released: 05/31/2010 Document Revised: 01/01/2015 Document Reviewed: 08/07/2013 Elsevier Interactive Patient Education 2016 [place] DO? Elastic bandages come in different shapes and sizes. They generally provide support to your injury and reduce swelling while you are healing, but they can perform different functions. Your health care provider will help you to decide what is best for your protection, recovery, or rehabilitation following an injury. WHAT ARE SOME GENERAL TIPS FOR USING AN ELASTIC BANDAGE?  Use the bandage as directed by the maker of the bandage that you are using.  Do not wrap the bandage too tightly. This may cut off the circulation in the arm or leg in the area below the bandage.  If part of your body beyond the bandage becomes blue, numb, cold, swollen, or is more painful, your bandage is most likely too tight. If this occurs, remove your bandage and reapply it more loosely.  See your health care provider if the bandage seems to be making your problems worse rather than better.  An elastic bandage should be removed and reapplied every 3-4 hours or as directed by your health care provider. WHAT IS RICE? The routine care of many injuries includes rest, ice, compression, and elevation (RICE therapy).  Rest Rest is required  to allow your body to heal. Generally, you can resume your routine activities when you are comfortable and have been given permission by your health care provider. Ice Icing your injury helps to keep the swelling down and it reduces pain. Do not apply ice directly to your skin.  Put ice in a plastic bag.  Place a towel between your skin and the bag.  Leave the ice on for 20 minutes, 2-3 times per day. Do this for as long as you are directed by your health care provider. Compression Compression helps to keep swelling down, gives support, and helps with  discomfort. Compression may be done with an elastic bandage. Elevation Elevation helps to reduce swelling and it decreases pain. If possible, your injured area should be placed at or above the level of your heart or the center of your chest. [place]? You should seek medical care if:  You have persistent pain and swelling.  Your symptoms are getting worse rather than improving. These symptoms may indicate that further evaluation or further X-rays are needed. Sometimes, X-rays may not show a small broken bone (fracture) until a number of days later. Make a follow-up appointment with your health care provider. Ask when your X-ray results will be ready. Make sure that you get your X-ray results. WHEN SHOULD I SEEK IMMEDIATE MEDICAL CARE? You should seek immediate medical care if:  You have a sudden onset of severe pain at or below the area of your injury.  You develop redness or increased swelling around your injury.  You have tingling or numbness at or below the area of your injury that does not improve after you remove the elastic bandage.   This information is not intended to replace advice given to you by your health care provider. Make sure you discuss any questions you have with your health care provider.   Document Released: 06/02/2002 Document Revised: 09/01/2015 Document Reviewed: 07/27/2014 Elsevier Interactive Patient Education [employer].

## 2016-04-10 NOTE — ED Notes (Signed)
Pt here c/o R hip pain and back pain.  States she ran out of muslce relaxers and is supposed to have her meds refilled next Mon.  States hx of same pain before.

## 2016-04-10 NOTE — ED Provider Notes (Signed)
CSN: [id number]     Arrival date & time 04/10/16  1201 History   First MD Initiated Contact with Patient 04/10/16 1622     Chief Complaint  Patient presents with  . Hip Pain     (Consider location/radiation/quality/duration/timing/severity/associated sxs/prior Treatment) HPI Comments: Pt comes in with cc of R hip pain. Pt reports that she is a fairly active woman, but the last 3 days she has had increased pain and has hadto take her muscle relaxant to move around. She also wants [place] to check her foot, which has been giving her some pan when she walks. No trauma, falls. No hx of surgery. No uti like symptoms. No back pain. No numbness, tingling.  Patient is a 82 y.o. female presenting with hip pain. The history is provided by the patient.  Hip Pain    Past Medical History  Diagnosis Date  . Glaucoma   . Dementia   . Spondylosis   . Foot deformity   . Migraine   . CAD (coronary artery disease)   . Elevated BP   . Diverticulosis   . H/O: GI bleed   . Acute renal insufficiency   . GERD (gastroesophageal reflux disease)   . Barrett's esophagus   . Hyperglycemia   . HSV-2 (herpes simplex virus 2) infection   . Abnormal finding on Pap smear, ASCUS   . Osteoporosis     worsening  . Amputation finger     index  . Hypertension    Past Surgical History  Procedure Laterality Date  . Partial hysterectomy    . Amputation finger / thumb      secondary to osteomyelitis  . Foot surgery      right  . Upper gastrointestinal endoscopy    . Colonoscopy     Family History  Problem Relation Age of Onset  . Breast cancer Mother   . Heart attack Mother   . Heart disease Father   . Alcohol abuse Son   . Colon cancer Neg Hx   . Stomach cancer Neg Hx   . Rectal cancer Neg Hx    Social History  Substance Use Topics  . Smoking status: Never Smoker   . Smokeless tobacco: Never Used  . Alcohol Use: No   OB History    No data available     Review of Systems    Allergies   Review of patient's allergies indicates no known allergies.  Home Medications   Prior to Admission medications   Medication Sig Start Date End Date Taking? Authorizing Provider  aspirin 81 MG tablet Take 81 mg by mouth daily.      Historical Provider, MD  calcium carbonate (CALCIUM 500) 1250 MG tablet Take 3 tablets by mouth daily.     Historical Provider, MD  Cholecalciferol (VITAMIN D-3 PO) Take 4,500 mg by mouth daily.      Historical Provider, MD  conjugated estrogens (PREMARIN) vaginal cream Place 1 Applicatorful vaginally once a week.     Historical Provider, MD  cyclobenzaprine (FLEXERIL) 5 MG tablet Take one half to one tablet if needed for muscle spasm up to twice a day. 10/07/15   [name], MD  desonide (DESOWEN) 0.05 % cream Apply 1 application topically 2 (two) times daily as needed (dry skin).     Historical Provider, MD  ferrous sulfate 325 (65 FE) MG tablet Take 325 mg by mouth daily with breakfast.      Historical Provider, MD  gabapentin (NEURONTIN) 800 MG  tablet take 1 tablet by mouth at bedtime 03/10/16   [name], MD  hydrochlorothiazide (MICROZIDE) 12.5 MG capsule TAKE ONE CAPSULE BY MOUTH EVERY TUESDAY AND SATURDAY 12/17/15   [name], MD  HYDROcodone-acetaminophen (NORCO) 5-325 MG tablet Take 0.5-1 tablets by mouth every 4 (four) hours as needed for moderate pain. 09/29/15   Margarita Mail, PA-C  hydroxypropyl methylcellulose (ISOPTO TEARS) 2.5 % ophthalmic solution Place 1 drop into both eyes 2 (two) times daily.    Historical Provider, MD  lactose free nutrition (BOOST) LIQD Take 237 mLs by mouth 2 (two) times daily between meals. 03/22/16   [name], MD  metoprolol succinate (TOPROL-XL) 50 MG 24 hr tablet take 1 tablet by mouth once daily 12/16/15   [name], MD  Multiple Vitamins-Minerals (HAIR/SKIN/NAILS PO) Take by mouth daily.      Historical Provider, MD  nystatin (MYCOSTATIN) cream Apply 1 application topically as needed (dry skin). Reported  on 03/30/2016    Historical Provider, MD  omeprazole (PRILOSEC) 40 MG capsule Take 1 capsule twice a day for 2 weeks, then 1 capsule daily. 01/27/16   Manus Gunning, MD  promethazine (PHENERGAN) 25 MG tablet Take 1 tablet (25 mg total) by mouth 2 (two) times daily as needed for nausea or vomiting. 01/05/16   Manus Gunning, MD  traMADol-acetaminophen (ULTRACET) 37.5-325 MG tablet take 1 tablet by mouth every 12 hours if needed for pain 04/10/16   Varney Biles, MD  verapamil (CALAN-SR) 240 MG CR tablet Take 1 tablet (240 mg total) by mouth daily. 10/22/15   Jaynee Eagles, PA-C  vitamin C (ASCORBIC ACID) 500 MG tablet Take 500 mg by mouth daily.    Historical Provider, MD   BP 119/57 mmHg  Pulse 67  Temp(Src) 98.1 F (36.7 C) (Oral)  Resp 17  SpO2 96% Physical Exam  Constitutional: She is oriented to person, place, and time. She appears well-developed.  HENT:  Head: Normocephalic and atraumatic.  Eyes: EOM are normal.  Neck: Normal range of motion. Neck supple.  Cardiovascular: Normal rate.   Pulmonary/Chest: Effort normal.  Abdominal: Bowel sounds are normal.  Musculoskeletal:  No midline L spine tenderness. Hip internal and external rotation are normal. Pt does have tenderness to palpation of the posterior R hip. Foot - no swelling, redness, ankle ROM is intact. Pt has calcaneal tenderness to palpation. Ambulated in the room.  Neurological: She is alert and oriented to person, place, and time.  Skin: Skin is warm and dry.  Nursing note and vitals reviewed.   ED Course  Procedures (including critical care time) Labs Review Labs Reviewed - No data to display  Imaging Review Dg Foot Complete Right  04/10/2016  CLINICAL DATA:  Right foot pain. EXAM: RIGHT FOOT COMPLETE - 3+ VIEW COMPARISON:  None. FINDINGS: Hallux valgus with bony bunion. Flat calcification along the distal and medial margin of the first metatarsal head suggesting chondrocalcinosis along the joint  capsule. Hyperextension of the second MTP joint with flexion of the proximal interphalangeal joint, possible hammertoe deformity. No malalignment at the Lisfranc joint. Mild spurring along the Lisfranc joint. Bony demineralization. Plantar calcaneal spur. Vascular calcifications. Faint calcification dorsal to the Lisfranc joint may be vascular. IMPRESSION: 1. Hallux valgus with bony bunion. 2. Hyperflexion of the second MTP joint with suspected second digit hammertoe. 3. Slight calcification along the medial and distal margins of the first metatarsal head suggesting joint capsule chondrocalcinosis. 4. Bony demineralization. 5. Plantar calcaneal spur. 6. Vascular  calcifications. Electronically Signed   By: Van Clines M.D.   On: 04/10/2016 17:55   Dg Hip Unilat With Pelvis 2-3 Views Right  04/10/2016  CLINICAL DATA:  Right hip and back pain. EXAM: DG HIP (WITH OR WITHOUT PELVIS) 2-3V RIGHT COMPARISON:  09/29/2015 FINDINGS: Meniscal chondrocalcinosis in both hips. Moderate axial articular space narrowing in the right hip with mild chondral thinning in the left hip. Prominent lower lumbar spondylosis and degenerative disc disease. Bony demineralization. Extensive vascular calcifications. I do not see a fracture. Reduced sensitivity for fracture given the spurring and bony demineralization, if the patient has had trauma or is unable to bear weight then consider MRI or CT for further workup. IMPRESSION: 1. No fracture identified. Reduced sensitivity due to bony demineralization and spurring. Consider CT or MRI if the patient is unable to bear weight. 2. Chondrocalcinosis and degenerative arthropathy of both hips. 3. Lower lumbar spondylosis and degenerative disc disease. Electronically Signed   By: Van Clines M.D.   On: 04/10/2016 17:52   I have personally reviewed and evaluated these images and lab results as part of my medical decision-making.   EKG Interpretation None      MDM   Final  diagnoses:  Hip pain, acute, right    Pt with hip pain. R sided, no falls. Able to ambulate. Likely OA. Stable for d/c, with pcp f/u advised.   Varney Biles, MD 04/10/16 1910

## 2016-04-10 NOTE — ED Notes (Signed)
Transported to radiology. Pt conversing with staff. Laughing. Appears in nad

## 2016-04-12 ENCOUNTER — Other Ambulatory Visit: Payer: Self-pay | Admitting: *Deleted

## 2016-04-12 ENCOUNTER — Telehealth: Payer: Self-pay

## 2016-04-12 MED ORDER — AMOXICILLIN 500 MG PO CAPS: 500.0000 mg | ORAL_CAPSULE | Freq: Two times a day (BID) | ORAL | Status: DC

## 2016-04-12 NOTE — Telephone Encounter (Signed)
Dr. Everlene Farrier, Patient is stating [facility] will not take the prescription for Boost.  Patient wants to find out if you know of another location that will take the prescription.  PT # [phone]

## 2016-04-18 DIAGNOSIS — H40019 Open angle with borderline findings, low risk, unspecified eye: Secondary | ICD-10-CM | POA: Diagnosis not present

## 2016-04-19 DIAGNOSIS — M79609 Pain in unspecified limb: Secondary | ICD-10-CM | POA: Diagnosis not present

## 2016-04-19 DIAGNOSIS — M79642 Pain in left hand: Secondary | ICD-10-CM | POA: Diagnosis not present

## 2016-04-19 DIAGNOSIS — M19042 Primary osteoarthritis, left hand: Secondary | ICD-10-CM | POA: Diagnosis not present

## 2016-04-20 NOTE — Telephone Encounter (Signed)
To my knowledge I do not know of any  pharmacies that will cover a prescription for boost

## 2016-04-20 NOTE — Telephone Encounter (Signed)
Dr. Everlene Farrier I know that any pharmacy will take her prescription but it will not be covered by insurance.  Do you know anywhere else she can get this?

## 2016-04-21 DIAGNOSIS — M79642 Pain in left hand: Secondary | ICD-10-CM | POA: Diagnosis not present

## 2016-04-21 DIAGNOSIS — M19042 Primary osteoarthritis, left hand: Secondary | ICD-10-CM | POA: Diagnosis not present

## 2016-04-21 NOTE — Telephone Encounter (Signed)
Patient understands that she has to pay for the boost.

## 2016-04-28 ENCOUNTER — Ambulatory Visit (INDEPENDENT_AMBULATORY_CARE_PROVIDER_SITE_OTHER): Payer: Medicare Other | Admitting: Emergency Medicine

## 2016-04-28 VITALS — BP 154/72 | HR 82 | Temp 98.2°F | Resp 18 | Ht <= 58 in | Wt 110.0 lb

## 2016-04-28 DIAGNOSIS — I10 Essential (primary) hypertension: Secondary | ICD-10-CM | POA: Diagnosis not present

## 2016-04-28 DIAGNOSIS — R634 Abnormal weight loss: Secondary | ICD-10-CM | POA: Diagnosis not present

## 2016-04-28 DIAGNOSIS — N390 Urinary tract infection, site not specified: Secondary | ICD-10-CM

## 2016-04-28 NOTE — Patient Instructions (Signed)
     IF you received an x-ray today, you will receive an invoice from [facility]. Please contact [facility] at [phone] with questions or concerns regarding your invoice.   IF you received labwork today, you will receive an invoice from [facility]. Please contact [facility] at [phone] with questions or concerns regarding your invoice.   Our billing staff will not be able to assist you with questions regarding bills from these companies.  You will be contacted with the lab results as soon as they are available. The fastest way to get your results is to activate your My Chart account. Instructions are located on the last page of this paperwork. If you have not heard from us regarding the results in 2 weeks, please contact this office.      

## 2016-04-28 NOTE — Progress Notes (Signed)
By signing my name below, I, Raven Small, attest that this documentation has been prepared under the direction and in the presence of Arlyss Queen, MD.  Electronically Signed: Thea Alken, ED Scribe. 04/28/2016. 11:22 AM.  Chief Complaint:  Chief Complaint  Patient presents with  . Follow-up    ED/ hip pain    HPI: [name] is a 82 y.o. female who reports to [facility] today complaining of right hip pain. Pt was seen at the ED 4/17 for right hip pain that started 3 days prior. She had an XR at that time which showed arthritis.  She reports hip pain has improved since being seen.   Wt Readings from Last 3 Encounters:  04/28/16 101 lb (45.813 kg)  03/30/16 109 lb (49.442 kg)  01/17/16 104 lb (47.174 kg)    Pt has lost 8lbs in the past month. Pt receives meals from [employer]. She also receives food stamps. Pt states she has not been eating well and plans to do better with eating habits. I have tried to get her on boost but insurance does not pay for this.    When pt was last seen here she was found to have an ecoli urinary infection. She was treated with 500 twice a day for 5 days.  Past Medical History  Diagnosis Date  . Glaucoma   . Dementia   . Spondylosis   . Foot deformity   . Migraine   . CAD (coronary artery disease)   . Elevated BP   . Diverticulosis   . H/O: GI bleed   . Acute renal insufficiency   . GERD (gastroesophageal reflux disease)   . Barrett's esophagus   . Hyperglycemia   . HSV-2 (herpes simplex virus 2) infection   . Abnormal finding on Pap smear, ASCUS   . Osteoporosis     worsening  . Amputation finger     index  . Hypertension    Past Surgical History  Procedure Laterality Date  . Partial hysterectomy    . Amputation finger / thumb      secondary to osteomyelitis  . Foot surgery      right  . Upper gastrointestinal endoscopy    . Colonoscopy     Social History   Social History  . Marital Status: Single    Spouse Name: N/A  .  Number of Children: 1  . Years of Education: N/A   Occupational History  .     Social History Main Topics  . Smoking status: Never Smoker   . Smokeless tobacco: Never Used  . Alcohol Use: No  . Drug Use: No  . Sexual Activity: Not on file   Other Topics Concern  . Not on file   Social History Narrative   Family History  Problem Relation Age of Onset  . Breast cancer Mother   . Heart attack Mother   . Heart disease Father   . Alcohol abuse Son   . Colon cancer Neg Hx   . Stomach cancer Neg Hx   . Rectal cancer Neg Hx    No Known Allergies Prior to Admission medications   Medication Sig Start Date End Date Taking? Authorizing Provider  aspirin 81 MG tablet Take 81 mg by mouth daily.     Yes Historical Provider, MD  calcium carbonate (CALCIUM 500) 1250 MG tablet Take 3 tablets by mouth daily.    Yes Historical Provider, MD  Cholecalciferol (VITAMIN D-3 PO) Take 4,500 mg by  mouth daily.     Yes Historical Provider, MD  conjugated estrogens (PREMARIN) vaginal cream Place 1 Applicatorful vaginally once a week.    Yes Historical Provider, MD  cyclobenzaprine (FLEXERIL) 5 MG tablet Take one half to one tablet if needed for muscle spasm up to twice a day. 10/07/15  Yes [name], MD  desonide (DESOWEN) 0.05 % cream Apply 1 application topically 2 (two) times daily as needed (dry skin).    Yes Historical Provider, MD  ferrous sulfate 325 (65 FE) MG tablet Take 325 mg by mouth daily with breakfast.     Yes Historical Provider, MD  gabapentin (NEURONTIN) 800 MG tablet take 1 tablet by mouth at bedtime 03/10/16  Yes [name], MD  hydrochlorothiazide (MICROZIDE) 12.5 MG capsule TAKE ONE CAPSULE BY MOUTH EVERY TUESDAY AND SATURDAY 12/17/15  Yes [name], MD  HYDROcodone-acetaminophen (NORCO) 5-325 MG tablet Take 0.5-1 tablets by mouth every 4 (four) hours as needed for moderate pain. 09/29/15  Yes Margarita Mail, PA-C  hydroxypropyl methylcellulose (ISOPTO TEARS) 2.5 %  ophthalmic solution Place 1 drop into both eyes 2 (two) times daily.   Yes Historical Provider, MD  lactose free nutrition (BOOST) LIQD Take 237 mLs by mouth 2 (two) times daily between meals. 03/22/16  Yes [name], MD  metoprolol succinate (TOPROL-XL) 50 MG 24 hr tablet take 1 tablet by mouth once daily 12/16/15  Yes [name], MD  Multiple Vitamins-Minerals (HAIR/SKIN/NAILS PO) Take by mouth daily.     Yes Historical Provider, MD  nystatin (MYCOSTATIN) cream Apply 1 application topically as needed (dry skin). Reported on 03/30/2016   Yes Historical Provider, MD  omeprazole (PRILOSEC) 40 MG capsule Take 1 capsule twice a day for 2 weeks, then 1 capsule daily. 01/27/16  Yes Manus Gunning, MD  promethazine (PHENERGAN) 25 MG tablet Take 1 tablet (25 mg total) by mouth 2 (two) times daily as needed for nausea or vomiting. 01/05/16  Yes Manus Gunning, MD  traMADol-acetaminophen (ULTRACET) 37.5-325 MG tablet take 1 tablet by mouth every 12 hours if needed for pain 04/10/16  Yes Varney Biles, MD  verapamil (CALAN-SR) 240 MG CR tablet Take 1 tablet (240 mg total) by mouth daily. 10/22/15  Yes Jaynee Eagles, PA-C  vitamin C (ASCORBIC ACID) 500 MG tablet Take 500 mg by mouth daily.   Yes Historical Provider, MD  amoxicillin (AMOXIL) 500 MG capsule Take 1 capsule (500 mg total) by mouth 2 (two) times daily. Patient not taking: Reported on 04/28/2016 04/12/16   [name], MD     ROS: The patient denies fevers, chills, night sweats, unintentional weight loss, chest pain, palpitations, wheezing, dyspnea on exertion, nausea, vomiting, abdominal pain, dysuria, hematuria, melena, numbness, weakness, or tingling.   All other systems have been reviewed and were otherwise negative with the exception of those mentioned in the HPI and as above.    PHYSICAL EXAM:  Filed Vitals:   04/28/16 1043  BP: 154/72  Pulse: 82  Temp: 98.2 F (36.8 C)  TempSrc: Oral  Resp: 18  Height: 4\' 9"  (1.448 m)   Weight: 101 lb (45.813 kg)  SpO2: 98%   Wt Readings from Last 3 Encounters:  04/28/16 101 lb (45.813 kg)  03/30/16 109 lb (49.442 kg)  01/17/16 104 lb (47.174 kg)     Body mass index is 21.85 kg/(m^2).   General: Alert, no acute distress HEENT:  Normocephalic, atraumatic, oropharynx patent. Eye: Juliette Mangle [facility] Cardiovascular:  Regular rate  and rhythm, no rubs murmurs or gallops.  No Carotid bruits, radial pulse intact. No pedal edema.  Respiratory: Clear to auscultation bilaterally.  No wheezes, rales, or rhonchi.  No cyanosis, no use of accessory musculature Abdominal: No organomegaly, abdomen is soft and non-tender, positive bowel sounds.  No masses. Musculoskeletal: Gait intact. No edema, tenderness. Severe scoliosis. Walks hunched over but does not appear to be in any pain with walking,  Skin: No rashes. Neurologic: Facial musculature symmetric. Psychiatric: Patient acts appropriately throughout our interaction. Lymphatic: No cervical or submandibular lymphadenopathy    LABS:    EKG/XRAY:   Primary read interpreted by Dr. Everlene Farrier at [facility].   ASSESSMENT/PLAN: Repeat blood pressure was normal. Repeat weight was 110. No change in medication. I did order a repeat urine culture. I personally performed the services described in this documentation, which was scribed in my presence. The recorded information has been reviewed and is accurate.  Gross sideeffects, risk and benefits, and alternatives of medications d/w patient. Patient is aware that all medications have potential sideeffects and we are unable to predict every sideeffect or drug-drug interaction that may occur.  [name] 04/28/2016 11:21 AM

## 2016-04-30 LAB — URINE CULTURE

## 2016-05-01 ENCOUNTER — Telehealth: Payer: Self-pay | Admitting: Emergency Medicine

## 2016-05-01 MED ORDER — AMOXICILLIN 500 MG PO CAPS: 500.0000 mg | ORAL_CAPSULE | Freq: Two times a day (BID) | ORAL | Status: DC

## 2016-05-01 NOTE — Telephone Encounter (Signed)
Pt notified of positive urine cx Rx Amoxicillin 500 mg tab reordered and e-scribed to [facility] Pt will return in 2 weeks for urine recheck

## 2016-05-01 NOTE — Telephone Encounter (Signed)
-----   Message from [name], MD sent at 04/30/2016  8:53 AM EDT ----- Urine culture positive sensitivity pending.

## 2016-05-04 ENCOUNTER — Ambulatory Visit: Payer: Medicare Other | Admitting: Emergency Medicine

## 2016-05-13 ENCOUNTER — Other Ambulatory Visit: Payer: Self-pay | Admitting: Urgent Care

## 2016-05-23 ENCOUNTER — Telehealth: Payer: Self-pay

## 2016-05-23 NOTE — Telephone Encounter (Signed)
Pt is needing a refill on her pain medication  Best number  [phone]

## 2016-05-24 ENCOUNTER — Other Ambulatory Visit: Payer: Self-pay | Admitting: Emergency Medicine

## 2016-05-25 ENCOUNTER — Telehealth: Payer: Self-pay | Admitting: *Deleted

## 2016-05-25 NOTE — Telephone Encounter (Signed)
Written Rx for patient for tramadol-acetaminophen (Ultracet) 37.5-325 mg tablet. Take 1 tablet by mouth every 12 hours as needed for pain. Dispense as Written Qty 60(Sixty) tablet. Refills x 2. Spoke with Juliann Pulse, Pharmacist, [employer], [employer].

## 2016-05-25 NOTE — Telephone Encounter (Signed)
Faxed

## 2016-06-01 ENCOUNTER — Telehealth: Payer: Self-pay

## 2016-06-01 DIAGNOSIS — D229 Melanocytic nevi, unspecified: Secondary | ICD-10-CM

## 2016-06-01 NOTE — Telephone Encounter (Signed)
Patient needs to be referred to a new determaologist because the one she normally goes to does not take her insurance anymore and she cant go there. She would like to get a full body check of her moles and everything she states she hasn't had one in a few years.  Her call back number is [phone]

## 2016-06-01 NOTE — Telephone Encounter (Signed)
Please go ahead and make the appointment. I am not sure if any of the dermatologist except Medicare

## 2016-06-01 NOTE — Telephone Encounter (Signed)
Ok

## 2016-06-04 ENCOUNTER — Other Ambulatory Visit: Payer: Self-pay | Admitting: Emergency Medicine

## 2016-06-08 ENCOUNTER — Other Ambulatory Visit: Payer: Self-pay | Admitting: Emergency Medicine

## 2016-06-08 ENCOUNTER — Telehealth: Payer: Self-pay | Admitting: Gastroenterology

## 2016-06-08 NOTE — Telephone Encounter (Signed)
Pt states insurance with not cover the omeprazole now and she is requesting a different drug, please advise.

## 2016-06-08 NOTE — Telephone Encounter (Signed)
Attempted to call pt, no answer. 

## 2016-06-08 NOTE — Telephone Encounter (Signed)
This doesn't make sense to me, omeprazole is usually the cheapest option. If her insurance covers any of the other PPIs we can use that. Any way we can check? If it covers pantoprazole, lansoprazole, esomeprazole, we can use any of those. Thanks

## 2016-06-09 ENCOUNTER — Ambulatory Visit (INDEPENDENT_AMBULATORY_CARE_PROVIDER_SITE_OTHER): Payer: Medicare Other | Admitting: Emergency Medicine

## 2016-06-09 ENCOUNTER — Telehealth: Payer: Self-pay | Admitting: Emergency Medicine

## 2016-06-09 DIAGNOSIS — R350 Frequency of micturition: Secondary | ICD-10-CM | POA: Diagnosis not present

## 2016-06-09 LAB — POCT URINALYSIS DIP (MANUAL ENTRY)
Bilirubin, UA: NEGATIVE
GLUCOSE UA: NEGATIVE
Ketones, POC UA: NEGATIVE
NITRITE UA: POSITIVE — AB
PH UA: 7
Protein Ur, POC: NEGATIVE
RBC UA: NEGATIVE
Spec Grav, UA: 1.015
UROBILINOGEN UA: 0.2

## 2016-06-09 LAB — POC MICROSCOPIC URINALYSIS (UMFC): Mucus: ABSENT

## 2016-06-09 NOTE — Telephone Encounter (Signed)
[name] came in saying she needs a refill of Gabapentin "badly." She's completely out of the medication. She'd like a Rx sent to her pharmacy if possible.  Thank you.

## 2016-06-09 NOTE — Progress Notes (Signed)
This was an orders only encounter. She came by and left a urine specimen which was sent for urinalysis and urine culture. Results for orders placed or performed in visit on 06/09/16  POCT urinalysis dipstick  Result Value Ref Range   Color, UA yellow yellow   Clarity, UA cloudy (A) clear   Glucose, UA negative negative   Bilirubin, UA negative negative   Ketones, POC UA negative negative   Spec Grav, UA 1.015    Blood, UA negative negative   pH, UA 7.0    Protein Ur, POC negative negative   Urobilinogen, UA 0.2    Nitrite, UA Positive (A) Negative   Leukocytes, UA Trace (A) Negative  POCT Microscopic Urinalysis ([facility])  Result Value Ref Range   WBC,UR,HPF,POC Few (A) None WBC/hpf   RBC,UR,HPF,POC None None RBC/hpf   Bacteria Many (A) None, Too numerous to count   Mucus Absent Absent   Epithelial Cells, UR Per Microscopy Few (A) None, Too numerous to count cells/hpf

## 2016-06-09 NOTE — Telephone Encounter (Signed)
Dr Everlene Farrier, pt was in for check up last month, but don't see gabapentin discussed recently. OK to RF?

## 2016-06-10 ENCOUNTER — Other Ambulatory Visit: Payer: Self-pay | Admitting: Emergency Medicine

## 2016-06-11 LAB — URINE CULTURE

## 2016-06-12 MED ORDER — PANTOPRAZOLE SODIUM 40 MG PO TBEC: 40.0000 mg | DELAYED_RELEASE_TABLET | Freq: Every day | ORAL | Status: DC

## 2016-06-12 NOTE — Telephone Encounter (Signed)
Patient returned phone call. Best # [phone]

## 2016-06-12 NOTE — Telephone Encounter (Signed)
Spoke with pt and protonix script sent to pharmacy for pt.

## 2016-06-13 ENCOUNTER — Other Ambulatory Visit: Payer: Self-pay | Admitting: Emergency Medicine

## 2016-06-15 ENCOUNTER — Other Ambulatory Visit: Payer: Self-pay | Admitting: Emergency Medicine

## 2016-06-29 DIAGNOSIS — G894 Chronic pain syndrome: Secondary | ICD-10-CM | POA: Diagnosis not present

## 2016-06-29 DIAGNOSIS — M5136 Other intervertebral disc degeneration, lumbar region: Secondary | ICD-10-CM | POA: Diagnosis not present

## 2016-07-07 DIAGNOSIS — M5136 Other intervertebral disc degeneration, lumbar region: Secondary | ICD-10-CM | POA: Diagnosis not present

## 2016-07-07 DIAGNOSIS — M5416 Radiculopathy, lumbar region: Secondary | ICD-10-CM | POA: Diagnosis not present

## 2016-07-07 DIAGNOSIS — M5116 Intervertebral disc disorders with radiculopathy, lumbar region: Secondary | ICD-10-CM | POA: Diagnosis not present

## 2016-07-10 DIAGNOSIS — M79642 Pain in left hand: Secondary | ICD-10-CM | POA: Diagnosis not present

## 2016-07-10 DIAGNOSIS — G5602 Carpal tunnel syndrome, left upper limb: Secondary | ICD-10-CM | POA: Diagnosis not present

## 2016-07-10 DIAGNOSIS — M65342 Trigger finger, left ring finger: Secondary | ICD-10-CM | POA: Diagnosis not present

## 2016-07-10 DIAGNOSIS — M79641 Pain in right hand: Secondary | ICD-10-CM | POA: Diagnosis not present

## 2016-07-11 ENCOUNTER — Ambulatory Visit: Payer: Medicare Other | Admitting: Podiatry

## 2016-07-12 DIAGNOSIS — H401132 Primary open-angle glaucoma, bilateral, moderate stage: Secondary | ICD-10-CM | POA: Diagnosis not present

## 2016-07-12 DIAGNOSIS — H25813 Combined forms of age-related cataract, bilateral: Secondary | ICD-10-CM | POA: Diagnosis not present

## 2016-07-17 ENCOUNTER — Telehealth: Payer: Self-pay | Admitting: Gastroenterology

## 2016-07-17 NOTE — Telephone Encounter (Signed)
Spoke with patient and she states the Pantoprazole 40 mg daily is not helping her reflux. Please, advise.

## 2016-07-18 ENCOUNTER — Ambulatory Visit (INDEPENDENT_AMBULATORY_CARE_PROVIDER_SITE_OTHER): Payer: Medicare Other | Admitting: Podiatry

## 2016-07-18 ENCOUNTER — Encounter: Payer: Self-pay | Admitting: Podiatry

## 2016-07-18 DIAGNOSIS — B351 Tinea unguium: Secondary | ICD-10-CM | POA: Diagnosis not present

## 2016-07-18 DIAGNOSIS — M79676 Pain in unspecified toe(s): Secondary | ICD-10-CM

## 2016-07-18 MED ORDER — PANTOPRAZOLE SODIUM 40 MG PO TBEC: 40.0000 mg | DELAYED_RELEASE_TABLET | Freq: Two times a day (BID) | ORAL | 0 refills | Status: DC

## 2016-07-18 NOTE — Telephone Encounter (Signed)
Spoke with patient and she wants to try Protonix 40 mg BID x 2 weeks. New rx sent to pharmacy.

## 2016-07-18 NOTE — Progress Notes (Signed)
Patient ID: [name], female   DOB: [birth year], 82 y.o.   MRN: [id number]   Subjective: This patient presents today complaining of elongated toenails walking wearing shoes and requests toenail debridement. Also, patient is complaining of painful callouses base of fifth metatarsals bilaterally and plantar right heel   Patient appears responsive and able to answer questions and provides paper copies of recent encounters at urgent care and primary care  Vascular: DP and PT pulses 2/4 bilaterally Capillary reflex immediate bilaterally Peripheral edema ankle left/foot No left or right calf pain or calf edema  Neurological: Ankle reflex equal reactive bilaterally Vibratory sensation nonreactive bilaterally Sensation to 10 g monofilament wire intact 5/5 bilaterally  Dermatological: No open skin lesions noted bilaterally tenails are elongated, brittle discolored 6-10 Iris reactive callus lateral plantar fifth MPJ bilaterally Plantar callus right heel   Musculoskeletal: HAV deformities bilaterally Overlapping second left toe Metatarsus adductus bilaterally  Plan: Debridement of toenails 6-10 mechanically had electrically without any bleeding Instructed patient apply foam pads around base of fifth metatarsals bilaterally  Reappoint at patient's request

## 2016-07-18 NOTE — Telephone Encounter (Signed)
She was previously taking omeprazole 40mg  daily which appeared to be working for her but was not covered by her insurance. Does she think omeprazole worked better for her? If so we can try switching her back. Otherwise, she can try taking protonix 40mg  BID for 2 weeks and see if this helps her symptoms. Long term we want to use the lowest dose needed to control her symptoms. Thanks

## 2016-07-18 NOTE — Patient Instructions (Signed)
Okay to apply foam pads around the the callused areas on the sides of your right and left feet daily or as needed Wear soft shoe to avoid pressure to the size of your feet Return as needed for trimming of toenails and calluses

## 2016-07-19 ENCOUNTER — Telehealth: Payer: Self-pay | Admitting: Gastroenterology

## 2016-07-19 NOTE — Telephone Encounter (Signed)
Spoke with patient and she will ask her pharmacist about the time to space out her medications.

## 2016-07-21 ENCOUNTER — Telehealth: Payer: Self-pay | Admitting: Gastroenterology

## 2016-07-21 NOTE — Telephone Encounter (Signed)
Spoke with patient and she is calling about her blood pressure. She will call her PCP for this.

## 2016-07-24 ENCOUNTER — Other Ambulatory Visit: Payer: Self-pay | Admitting: Gastroenterology

## 2016-07-26 DIAGNOSIS — M5136 Other intervertebral disc degeneration, lumbar region: Secondary | ICD-10-CM | POA: Diagnosis not present

## 2016-07-26 DIAGNOSIS — G894 Chronic pain syndrome: Secondary | ICD-10-CM | POA: Diagnosis not present

## 2016-07-27 DIAGNOSIS — D2272 Melanocytic nevi of left lower limb, including hip: Secondary | ICD-10-CM | POA: Diagnosis not present

## 2016-07-27 DIAGNOSIS — D225 Melanocytic nevi of trunk: Secondary | ICD-10-CM | POA: Diagnosis not present

## 2016-07-27 DIAGNOSIS — L565 Disseminated superficial actinic porokeratosis (DSAP): Secondary | ICD-10-CM | POA: Diagnosis not present

## 2016-07-27 DIAGNOSIS — D2271 Melanocytic nevi of right lower limb, including hip: Secondary | ICD-10-CM | POA: Diagnosis not present

## 2016-07-27 DIAGNOSIS — L821 Other seborrheic keratosis: Secondary | ICD-10-CM | POA: Diagnosis not present

## 2016-07-31 ENCOUNTER — Telehealth: Payer: Self-pay | Admitting: Gastroenterology

## 2016-07-31 NOTE — Telephone Encounter (Signed)
Patient notified that she should be taking it BID now. (2 week trial)

## 2016-08-01 ENCOUNTER — Encounter: Payer: Self-pay | Admitting: Family Medicine

## 2016-08-01 ENCOUNTER — Ambulatory Visit (INDEPENDENT_AMBULATORY_CARE_PROVIDER_SITE_OTHER): Payer: Medicare Other | Admitting: Family Medicine

## 2016-08-01 VITALS — BP 102/68 | HR 92 | Temp 98.3°F | Resp 17 | Ht <= 58 in | Wt 103.0 lb

## 2016-08-01 DIAGNOSIS — K2270 Barrett's esophagus without dysplasia: Secondary | ICD-10-CM

## 2016-08-01 DIAGNOSIS — L298 Other pruritus: Secondary | ICD-10-CM | POA: Diagnosis not present

## 2016-08-01 DIAGNOSIS — R634 Abnormal weight loss: Secondary | ICD-10-CM

## 2016-08-01 DIAGNOSIS — F0390 Unspecified dementia without behavioral disturbance: Secondary | ICD-10-CM | POA: Diagnosis not present

## 2016-08-01 DIAGNOSIS — I10 Essential (primary) hypertension: Secondary | ICD-10-CM

## 2016-08-01 DIAGNOSIS — H6123 Impacted cerumen, bilateral: Secondary | ICD-10-CM

## 2016-08-01 DIAGNOSIS — K219 Gastro-esophageal reflux disease without esophagitis: Secondary | ICD-10-CM

## 2016-08-01 DIAGNOSIS — G43909 Migraine, unspecified, not intractable, without status migrainosus: Secondary | ICD-10-CM

## 2016-08-01 DIAGNOSIS — M810 Age-related osteoporosis without current pathological fracture: Secondary | ICD-10-CM

## 2016-08-01 DIAGNOSIS — D509 Iron deficiency anemia, unspecified: Secondary | ICD-10-CM

## 2016-08-01 DIAGNOSIS — E7800 Pure hypercholesterolemia, unspecified: Secondary | ICD-10-CM | POA: Diagnosis not present

## 2016-08-01 DIAGNOSIS — N898 Other specified noninflammatory disorders of vagina: Secondary | ICD-10-CM

## 2016-08-01 LAB — CBC WITH DIFFERENTIAL/PLATELET
BASOS ABS: 0 {cells}/uL (ref 0–200)
BASOS PCT: 0 %
Eosinophils Absolute: 282 cells/uL (ref 15–500)
Eosinophils Relative: 6 %
HCT: 35.4 % (ref 35.0–45.0)
Hemoglobin: 11.4 g/dL — ABNORMAL LOW (ref 11.7–15.5)
LYMPHS ABS: 799 {cells}/uL — AB (ref 850–3900)
Lymphocytes Relative: 17 %
MCH: 28.1 pg (ref 27.0–33.0)
MCHC: 32.2 g/dL (ref 32.0–36.0)
MCV: 87.4 fL (ref 80.0–100.0)
MONO ABS: 423 {cells}/uL (ref 200–950)
MONOS PCT: 9 %
MPV: 9.3 fL (ref 7.5–12.5)
Neutro Abs: 3196 cells/uL (ref 1500–7800)
Neutrophils Relative %: 68 %
PLATELETS: 374 10*3/uL (ref 140–400)
RBC: 4.05 MIL/uL (ref 3.80–5.10)
RDW: 16.5 % — ABNORMAL HIGH (ref 11.0–15.0)
WBC: 4.7 10*3/uL (ref 3.8–10.8)

## 2016-08-01 LAB — COMPREHENSIVE METABOLIC PANEL
ALK PHOS: 50 U/L (ref 33–130)
ALT: 12 U/L (ref 6–29)
AST: 15 U/L (ref 10–35)
Albumin: 3.7 g/dL (ref 3.6–5.1)
BILIRUBIN TOTAL: 0.5 mg/dL (ref 0.2–1.2)
BUN: 19 mg/dL (ref 7–25)
CO2: 29 mmol/L (ref 20–31)
CREATININE: 0.96 mg/dL — AB (ref 0.60–0.88)
Calcium: 9.6 mg/dL (ref 8.6–10.4)
Chloride: 103 mmol/L (ref 98–110)
Glucose, Bld: 78 mg/dL (ref 65–99)
POTASSIUM: 4.2 mmol/L (ref 3.5–5.3)
SODIUM: 143 mmol/L (ref 135–146)
TOTAL PROTEIN: 6.4 g/dL (ref 6.1–8.1)

## 2016-08-01 LAB — POCT WET + KOH PREP
TRICH BY WET PREP: ABSENT
YEAST BY KOH: ABSENT
Yeast by wet prep: ABSENT

## 2016-08-01 LAB — LIPID PANEL
CHOLESTEROL: 199 mg/dL (ref 125–200)
HDL: 110 mg/dL (ref >=46)
LDL Cholesterol: 73 mg/dL (ref <130)
Total CHOL/HDL Ratio: 1.8 Ratio (ref <=5.0)
Triglycerides: 79 mg/dL (ref <150)
VLDL: 16 mg/dL (ref <30)

## 2016-08-01 LAB — POCT SEDIMENTATION RATE: POCT SED RATE: 28 mm/hr — AB (ref 0–22)

## 2016-08-01 NOTE — Progress Notes (Signed)
Patient ID: [name], female   DOB: 01/05/1933, 82 y.o.   MRN: [id number]   By signing my name below I, [name], attest that this documentation has been prepared under the direction and in the presence of Wardell Honour MD. Electonically Signed. [name], Scribe 8/8/2017at 10:31 AM  Subjective:    Patient ID: [name], female    DOB: 14-Nov-1933, 83 y.o.   MRN: [id number]  08/01/2016  Follow-up (neck hurts/weight loss/blood pressure)   HPI [name] is a 82 y.o. female who presents to the [facility] for follow up evaluation for her neck pain, weight-loss, and HTN.   Pt also wants to establish Dr Tamala Julian as her new PCP because her PCP is retiring.   Pt only drinks 1 boost a day because they are expensive. Pt drinks boost instead of ensure because ensure gives her gas. [employer] provides lunch 5 times a week. Pt eats the whole meal. Pt also reports that her home health aide cooks her breakfast in the mornings.  Pt states that she is feeling good. Pt has been walking for exercise every day.   Pt reports that she had an injection in her back and resolved her chronic back pain. Pt is also taking her tramadol for back pain.   Pt states that her gabapentin helps her sleep restfully at night.   Pt is having trouble having medicare cover her GERD medication, pantoprazole. Pt denies having reflux. Pt has daily BMs. Pt states her stool is typically hard.   Pt reports history of ear wax.  May need ear irrigation today.  Pt denies any CP.  Pt denies having a cardiologist.   Pt has had a hysterectomy. Pt is followed by a gynecologist.   Pt reports using her vaginal cream and states that she is still having vaginal itching. This is a chronic issue for patient; history of syphilis in the past five years.  Not sexually active at this time.   Pt reports "pins and needles" sensation in her hand.   Review of Systems  Constitutional: Positive for unexpected  weight change. Negative for chills, diaphoresis, fatigue and fever.  HENT: Negative for ear pain, postnasal drip, rhinorrhea, sinus pressure, sore throat and trouble swallowing.   Respiratory: Negative for cough and shortness of breath.   Cardiovascular: Negative for chest pain, palpitations and leg swelling.  Gastrointestinal: Negative for abdominal pain, constipation, diarrhea, nausea and vomiting.  Genitourinary: Negative for dysuria, flank pain, frequency, genital sores, hematuria, pelvic pain, urgency, vaginal bleeding, vaginal discharge and vaginal pain.       Pt is positive for vaginal itching.   Musculoskeletal: Positive for back pain.    Past Medical History:  Diagnosis Date  . Abnormal finding on Pap smear, ASCUS   . Acute renal insufficiency   . Amputation finger    index  . Barrett's esophagus   . CAD (coronary artery disease)   . Dementia   . Diverticulosis   . Elevated BP   . Foot deformity   . GERD (gastroesophageal reflux disease)   . Glaucoma   . H/O: GI bleed   . HSV-2 (herpes simplex virus 2) infection   . Hyperglycemia   . Hypertension   . Migraine   . Osteoporosis    worsening  . Spondylosis    Past Surgical History:  Procedure Laterality Date  . AMPUTATION FINGER / THUMB     secondary to osteomyelitis  . COLONOSCOPY    .  FOOT SURGERY     right  . PARTIAL HYSTERECTOMY    . UPPER GASTROINTESTINAL ENDOSCOPY     No Known Allergies  Social History   Social History  . Marital status: Single    Spouse name: N/A  . Number of children: 1  . Years of education: N/A   Occupational History  .  Retired   Social History Main Topics  . Smoking status: Never Smoker  . Smokeless tobacco: Never Used  . Alcohol use No  . Drug use: No  . Sexual activity: Not on file   Other Topics Concern  . Not on file   Social History Narrative  . No narrative on file   Family History  Problem Relation Age of Onset  . Breast cancer Mother   . Heart attack  Mother   . Heart disease Father   . Alcohol abuse Son   . Colon cancer Neg Hx   . Stomach cancer Neg Hx   . Rectal cancer Neg Hx        Objective:    BP 102/68 (BP Location: Right Arm, Patient Position: Sitting, Cuff Size: Normal)   Pulse 92   Temp 98.3 F (36.8 C) (Oral)   Resp 17   Ht 4\' 9"  (1.448 m)   Wt 103 lb (46.7 kg)   SpO2 97%   BMI 22.29 kg/m  Physical Exam  Constitutional: She is oriented to person, place, and time. She appears well-developed and well-nourished. No distress.  HENT:  Head: Normocephalic and atraumatic.  Right Ear: External ear normal.  Left Ear: External ear normal.  Nose: Nose normal.  Mouth/Throat: Oropharynx is clear and moist.  Pt has left cerumen impaction and cerumun that is not completely compacted in rt ear canal. TMs not visible due to cerumen.  Eyes: Conjunctivae and EOM are normal. Pupils are equal, round, and reactive to light.  Neck: Normal range of motion. Neck supple. Carotid bruit is not present. No thyromegaly present.  Cardiovascular: Normal rate, regular rhythm, normal heart sounds and intact distal pulses.  Exam reveals no gallop and no friction rub.   No murmur heard. Pulmonary/Chest: Effort normal and breath sounds normal. No respiratory distress. She has no decreased breath sounds. She has no wheezes. She has no rales.  Abdominal: Soft. Bowel sounds are normal. She exhibits no distension and no mass. There is no hepatosplenomegaly. There is no tenderness. There is no rebound, no guarding and no CVA tenderness.  Genitourinary: No erythema, tenderness or bleeding in the vagina. No foreign body in the vagina. No signs of injury around the vagina. No vaginal discharge found.  Genitourinary Comments: Mild labial swelling bilat.  Hypopigmentation of medial labia.   Lymphadenopathy:    She has no cervical adenopathy.  Neurological: She is alert and oriented to person, place, and time. No cranial nerve deficit.  Skin: Skin is warm and  dry. No rash noted. She is not diaphoretic. No erythema. No pallor.  Psychiatric: She has a normal mood and affect. Her behavior is normal.   Wt Readings from Last 3 Encounters:  08/01/16 103 lb (46.7 kg)  04/28/16 110 lb (49.9 kg)  03/30/16 109 lb (49.4 kg)   Depression screen [facility] 2/9 08/01/2016 04/28/2016 04/28/2016 03/30/2016 12/02/2015  Decreased Interest 0 0 0 0 0  Down, Depressed, Hopeless 0 0 0 0 0  PHQ - 2 Score 0 0 0 0 0   Fall Risk  08/01/2016 04/28/2016 03/30/2016 10/07/2015 08/26/2015  Falls in the past  year? No No No No No  Number falls in past yr: - - - - -  Injury with Fall? - - - - -       Assessment & Plan:   1. Essential hypertension   2. Migraine without status migrainosus, not intractable, unspecified migraine type   3. Barrett's esophagus without dysplasia   4. Gastroesophageal reflux disease without esophagitis   5. Dementia, without behavioral disturbance   6. Osteoporosis   7. ANEMIA, IRON DEFICIENCY   8. Loss of weight   9. Pure hypercholesterolemia   10. Vaginal itching   11. Cerumen impaction, bilateral    -s/p ear irrigation during office visit.   -benign pelvic exam in office. No pathology contributing to vaginal itching; continue current treatment plan per gynecology. -concerned with ongoing weight loss. -increase boost to two daily. -RTC in 2 months for weight check.  Obtain TSH. -continue current medications.   Orders Placed This Encounter  Procedures  . CBC with Differential/Platelet  . Comprehensive metabolic panel    Order Specific Question:   Has the patient fasted?    Answer:   Yes  . Lipid panel    Order Specific Question:   Has the patient fasted?    Answer:   Yes  . TSH  . Ear wax removal  . POCT SEDIMENTATION RATE  . POCT Wet + KOH Prep   No orders of the defined types were placed in this encounter.   Return in about 2 months (around 10/01/2016) for recheck weight loss.   I personally performed the services described in this  documentation, which was scribed in my presence. The recorded information has been reviewed and considered.  Cyris Maalouf Elayne Guerin, M.D. [facility] [address] [place], [place]  [address] [phone] phone [phone] fax

## 2016-08-01 NOTE — Patient Instructions (Signed)
     IF you received an x-ray today, you will receive an invoice from [facility]. Please contact [facility] at [phone] with questions or concerns regarding your invoice.   IF you received labwork today, you will receive an invoice from [facility]. Please contact [facility] at [phone] with questions or concerns regarding your invoice.   Our billing staff will not be able to assist you with questions regarding bills from these companies.  You will be contacted with the lab results as soon as they are available. The fastest way to get your results is to activate your My Chart account. Instructions are located on the last page of this paperwork. If you have not heard from us regarding the results in 2 weeks, please contact this office.      

## 2016-08-02 LAB — TSH: TSH: 1.64 m[IU]/L

## 2016-08-05 ENCOUNTER — Telehealth: Payer: Self-pay

## 2016-08-05 NOTE — Telephone Encounter (Signed)
The patient states that she is having a lot of gas lately.  She wants to ask Dr Tamala Julian if she should continue drinking the glass of whole milk every day, and if she should still have one scoop of Boost a day.  She wants to know if it may be contributing to the gas problems she's been having.  Please advise, thank you.  CB#: [phone]

## 2016-08-08 NOTE — Telephone Encounter (Signed)
Have patient stop the milk to see if the gas will improve.  Can she put the scoop of Boost into another liquid?

## 2016-08-09 ENCOUNTER — Telehealth: Payer: Self-pay | Admitting: Gastroenterology

## 2016-08-09 NOTE — Telephone Encounter (Signed)
Pt advised. She verbalized understanding .

## 2016-08-10 NOTE — Telephone Encounter (Signed)
Spoke with patient and she will try 2% milk instead of whole milk.

## 2016-08-17 ENCOUNTER — Telehealth: Payer: Self-pay | Admitting: Emergency Medicine

## 2016-08-17 NOTE — Telephone Encounter (Signed)
Pt called in requesting lab results. Results read and given per Dr. Everlene Farrier

## 2016-08-30 ENCOUNTER — Other Ambulatory Visit: Payer: Self-pay | Admitting: Gastroenterology

## 2016-08-30 ENCOUNTER — Telehealth: Payer: Self-pay | Admitting: Family Medicine

## 2016-08-30 NOTE — Telephone Encounter (Signed)
Patient is calling stating that for now on out she would like for Dr Tamala Julian to document her results to her and send all lab results to her in the mail she don't like phone calls and messages on machine she would like for it to be sent in the mail so she can see it and read it also if she need any medicine for any vaginal itch that she always have please send all medicine information on a letter so she can read what she is taking she states that Dr Everlene Farrier always wrote down all labs results so she could understand it I also told the patient that Dr Tamala Julian always document on her labs and document medicines long with labs if she would need any medicine

## 2016-09-01 NOTE — Telephone Encounter (Signed)
Noted  

## 2016-09-04 ENCOUNTER — Telehealth: Payer: Self-pay | Admitting: Family Medicine

## 2016-09-04 NOTE — Telephone Encounter (Signed)
No answer

## 2016-09-11 ENCOUNTER — Encounter: Payer: Self-pay | Admitting: Family Medicine

## 2016-09-14 ENCOUNTER — Encounter (HOSPITAL_COMMUNITY): Payer: Self-pay | Admitting: Emergency Medicine

## 2016-09-14 ENCOUNTER — Emergency Department (HOSPITAL_COMMUNITY): Payer: Medicare Other

## 2016-09-14 ENCOUNTER — Emergency Department (HOSPITAL_COMMUNITY)
Admission: EM | Admit: 2016-09-14 | Discharge: 2016-09-15 | Disposition: A | Discharge status: Home / Self Care | Payer: Medicare Other | Attending: Emergency Medicine | Admitting: Emergency Medicine

## 2016-09-14 DIAGNOSIS — Z7982 Long term (current) use of aspirin: Secondary | ICD-10-CM | POA: Insufficient documentation

## 2016-09-14 DIAGNOSIS — Y939 Activity, unspecified: Secondary | ICD-10-CM | POA: Diagnosis not present

## 2016-09-14 DIAGNOSIS — W1839XA Other fall on same level, initial encounter: Secondary | ICD-10-CM | POA: Diagnosis not present

## 2016-09-14 DIAGNOSIS — I2510 Atherosclerotic heart disease of native coronary artery without angina pectoris: Secondary | ICD-10-CM | POA: Insufficient documentation

## 2016-09-14 DIAGNOSIS — Y999 Unspecified external cause status: Secondary | ICD-10-CM | POA: Diagnosis not present

## 2016-09-14 DIAGNOSIS — I10 Essential (primary) hypertension: Secondary | ICD-10-CM | POA: Insufficient documentation

## 2016-09-14 DIAGNOSIS — M19019 Primary osteoarthritis, unspecified shoulder: Secondary | ICD-10-CM

## 2016-09-14 DIAGNOSIS — M1990 Unspecified osteoarthritis, unspecified site: Secondary | ICD-10-CM | POA: Diagnosis not present

## 2016-09-14 DIAGNOSIS — Y929 Unspecified place or not applicable: Secondary | ICD-10-CM | POA: Insufficient documentation

## 2016-09-14 DIAGNOSIS — S29001A Unspecified injury of muscle and tendon of front wall of thorax, initial encounter: Secondary | ICD-10-CM | POA: Diagnosis not present

## 2016-09-14 DIAGNOSIS — R0789 Other chest pain: Secondary | ICD-10-CM | POA: Diagnosis not present

## 2016-09-14 DIAGNOSIS — S299XXA Unspecified injury of thorax, initial encounter: Secondary | ICD-10-CM | POA: Diagnosis not present

## 2016-09-14 DIAGNOSIS — Z79899 Other long term (current) drug therapy: Secondary | ICD-10-CM | POA: Insufficient documentation

## 2016-09-14 DIAGNOSIS — M25512 Pain in left shoulder: Secondary | ICD-10-CM | POA: Diagnosis not present

## 2016-09-14 LAB — COMPREHENSIVE METABOLIC PANEL
ALBUMIN: 3.3 g/dL — AB (ref 3.5–5.0)
ALT: 12 U/L — ABNORMAL LOW (ref 14–54)
ANION GAP: 5 (ref 5–15)
AST: 15 U/L (ref 15–41)
Alkaline Phosphatase: 49 U/L (ref 38–126)
BILIRUBIN TOTAL: 0.4 mg/dL (ref 0.3–1.2)
BUN: 17 mg/dL (ref 6–20)
CO2: 31 mmol/L (ref 22–32)
Calcium: 8.9 mg/dL (ref 8.9–10.3)
Chloride: 107 mmol/L (ref 101–111)
Creatinine, Ser: 0.86 mg/dL (ref 0.44–1.00)
GFR calc non Af Amer: >60 mL/min (ref >60)
GLUCOSE: 103 mg/dL — AB (ref 65–99)
POTASSIUM: 3.9 mmol/L (ref 3.5–5.1)
SODIUM: 143 mmol/L (ref 135–145)
TOTAL PROTEIN: 6.4 g/dL — AB (ref 6.5–8.1)

## 2016-09-14 LAB — CBC WITH DIFFERENTIAL/PLATELET
Basophils Absolute: 0 10*3/uL (ref 0.0–0.1)
Basophils Relative: 1 %
EOS ABS: 0.4 10*3/uL (ref 0.0–0.7)
EOS PCT: 8 %
HCT: 34.2 % — ABNORMAL LOW (ref 36.0–46.0)
Hemoglobin: 11.4 g/dL — ABNORMAL LOW (ref 12.0–15.0)
LYMPHS ABS: 1 10*3/uL (ref 0.7–4.0)
LYMPHS PCT: 19 %
MCH: 29.7 pg (ref 26.0–34.0)
MCHC: 33.3 g/dL (ref 30.0–36.0)
MCV: 89.1 fL (ref 78.0–100.0)
MONO ABS: 0.4 10*3/uL (ref 0.1–1.0)
MONOS PCT: 8 %
Neutro Abs: 3.3 10*3/uL (ref 1.7–7.7)
Neutrophils Relative %: 64 %
PLATELETS: 340 10*3/uL (ref 150–400)
RBC: 3.84 MIL/uL — ABNORMAL LOW (ref 3.87–5.11)
RDW: 13.6 % (ref 11.5–15.5)
WBC: 5.1 10*3/uL (ref 4.0–10.5)

## 2016-09-14 NOTE — ED Notes (Signed)
Writer ambulated with patient to the bathroom with no assistance

## 2016-09-14 NOTE — ED Triage Notes (Signed)
Pt states that she fell 2 days ago and has had gen body aches and diarrhea.  States that she lost her balance.  Pt is not a good historian.  States she cannot walk due to pain.  Family not at bedside.

## 2016-09-14 NOTE — Discharge Instructions (Signed)
Take over the counter pain medications as needed for pain, follow up with your doctor next week if the symptoms persist

## 2016-09-14 NOTE — ED Notes (Signed)
EKG completed because pt c/o CP after fall

## 2016-09-14 NOTE — ED Provider Notes (Signed)
[facility] DEPT Provider Note   CSN: [id number] Arrival date & time: 09/14/16  1314     History   Chief Complaint Chief Complaint  Patient presents with  . Fall  . Generalized Body Aches    HPI [name] is a 82 y.o. female.  HPI Pt states a couple of days ago She started having diarrhea.  She had a few episodes per day several days ago but it resolved after taking imodium.   A couple of days ago she was leasing over to pick something up and she lost her balance and fall.  She has been able to walk since but feels sorre.  She has trouble raising her left arm because of the soreness.  Her left ribs are also sore.     Past Medical History:  Diagnosis Date  . Abnormal finding on Pap smear, ASCUS   . Acute renal insufficiency   . Amputation finger    index  . Barrett's esophagus   . CAD (coronary artery disease)   . Dementia   . Diverticulosis   . Elevated BP   . Foot deformity   . GERD (gastroesophageal reflux disease)   . Glaucoma   . H/O: GI bleed   . HSV-2 (herpes simplex virus 2) infection   . Hyperglycemia   . Hypertension   . Migraine   . Osteoporosis    worsening  . Spondylosis     Patient Active Problem List   Diagnosis Date Noted  . Rectal bleeding 11/25/2014  . Gastroesophageal reflux disease without esophagitis 07/31/2014  . Loss of weight 07/31/2014  . Gastric ulcer without hemorrhage or perforation 07/22/2013  . Syphilis 02/27/2012  . Dementia   . Spondylosis   . Foot deformity   . Migraine   . CAD (coronary artery disease)   . Acute renal insufficiency   . GERD (gastroesophageal reflux disease)   . Hyperglycemia   . HSV-2 (herpes simplex virus 2) infection   . Abnormal finding on Pap smear, ASCUS   . Osteoporosis   . CONSTIPATION 12/12/2010  . NONSPECIFIC ABNORMAL FINDING IN STOOL CONTENTS 12/12/2010  . INFECTION, SKIN AND SOFT TISSUE 11/10/2010  . ABDOMINAL PAIN RIGHT UPPER QUADRANT 11/10/2010  . PUD, HX OF 11/10/2010  . Hemorrhage  of rectum and anus 02/21/2010  . ANEMIA, IRON DEFICIENCY 06/17/2008  . BARRETTS ESOPHAGUS 06/17/2008  . HYPERLIPIDEMIA 06/16/2008  . GLAUCOMA 06/16/2008  . Essential hypertension 06/16/2008  . DIVERTICULOSIS, COLON 06/16/2008  . HEMOCCULT POSITIVE STOOL 06/16/2008  . ARTHRITIS 06/16/2008  . SLEEP APNEA 06/16/2008    Past Surgical History:  Procedure Laterality Date  . AMPUTATION FINGER / THUMB     secondary to osteomyelitis  . COLONOSCOPY    . FOOT SURGERY     right  . PARTIAL HYSTERECTOMY    . UPPER GASTROINTESTINAL ENDOSCOPY      OB History    No data available       Home Medications    Prior to Admission medications   Medication Sig Start Date End Date Taking? Authorizing Provider  aspirin 81 MG tablet Take 81 mg by mouth every morning.    Yes Historical Provider, MD  Calcium Carbonate-Vitamin D (CALCIUM-VITAMIN D) 500-200 MG-UNIT tablet Take 3 tablets by mouth every morning.   Yes Historical Provider, MD  conjugated estrogens (PREMARIN) vaginal cream Place 1 Applicatorful vaginally once a week.    Yes Historical Provider, MD  cyclobenzaprine (FLEXERIL) 5 MG tablet TAKE 1/2 TO 1 TABLET BY MOUTH  UP TO TWICE DAILY AS NEEDED FOR MUSCLE SPASMS Patient taking differently: Take 2.5-5 mg by mouth up to twice daily as needed for muscle spasms 06/13/16  Yes [name], MD  desonide (DESOWEN) 0.05 % cream Apply 1 application topically 2 (two) times daily as needed (dry skin).    Yes Historical Provider, MD  ferrous sulfate 325 (65 FE) MG tablet Take 325 mg by mouth daily with breakfast.     Yes Historical Provider, MD  gabapentin (NEURONTIN) 800 MG tablet take 1 tablet by mouth at bedtime Patient taking differently: Take 800 mg by mouth at bedtime 06/09/16  Yes [name], MD  hydrochlorothiazide (MICROZIDE) 12.5 MG capsule take 1 capsule by mouth EVERY TUESDAY AND SATURDAY Patient taking differently: Take 12.5 mg by mouth EVERY TUESDAY AND SATURDAY 06/16/16  Yes [name], MD  hydroxypropyl methylcellulose (ISOPTO TEARS) 2.5 % ophthalmic solution Place 1 drop into both eyes 2 (two) times daily as needed for dry eyes.    Yes Historical Provider, MD  lactose free nutrition (BOOST) LIQD Take 237 mLs by mouth 2 (two) times daily between meals. Patient taking differently: Take 1 Container by mouth 2 (two) times daily as needed (nutritional).  03/22/16  Yes [name], MD  metoprolol succinate (TOPROL-XL) 50 MG 24 hr tablet take 1 tablet by mouth once daily Patient taking differently: Take 50 mg by mouth once every morning 06/06/16  Yes Chelle Jeffery, PA-C  Multiple Vitamins-Minerals (HAIR/SKIN/NAILS PO) Take 1 tablet by mouth every morning.    Yes Historical Provider, MD  nystatin (MYCOSTATIN) cream Apply 1 application topically as needed (dry skin). Reported on 03/30/2016   Yes Historical Provider, MD  pantoprazole (PROTONIX) 40 MG tablet take 1 tablet by mouth twice a day Patient taking differently: Take 40 mg by mouth twice daily 08/31/16  Yes Manus Gunning, MD  promethazine (PHENERGAN) 25 MG tablet take 1 tablet by mouth twice a day if needed for nausea and vomiting Patient taking differently: Take 25 mg by mouth twice a day if needed for nausea and vomiting 07/25/16  Yes Manus Gunning, MD  traMADol-acetaminophen (ULTRACET) 37.5-325 MG tablet TAKE 1 TABLET BY MOUTH EVERY 12 HOURS AS NEEDED FOR PAIN 05/25/16  Yes [name], MD  verapamil (CALAN-SR) 240 MG CR tablet take 1 tablet by mouth once daily Patient taking differently: Take 240 mg by mouth once every morning 05/13/16  Yes Jaynee Eagles, PA-C  vitamin C (ASCORBIC ACID) 500 MG tablet Take 500 mg by mouth daily.   Yes Historical Provider, MD    Family History Family History  Problem Relation Age of Onset  . Breast cancer Mother   . Heart attack Mother   . Heart disease Father   . Alcohol abuse Son   . Colon cancer Neg Hx   . Stomach cancer Neg Hx   . Rectal cancer Neg Hx     Social  History Social History  Substance Use Topics  . Smoking status: Never Smoker  . Smokeless tobacco: Never Used  . Alcohol use No     Allergies   Review of patient's allergies indicates no known allergies.   Review of Systems Review of Systems  Constitutional: Negative for fever.  Respiratory: Negative for cough and shortness of breath.   Gastrointestinal: Positive for nausea. Negative for abdominal pain and vomiting.  Genitourinary: Negative for dysuria.  All other systems reviewed and are negative.    Physical Exam Updated Vital Signs BP  124/62 (BP Location: Left Arm)   Pulse 72   Temp 98.6 F (37 C) (Oral)   Resp 18   Ht 4\' 11"  (1.499 m)   Wt 49.9 kg   SpO2 98%   BMI 22.22 kg/m   Physical Exam  Constitutional: No distress.  Elderly frail   HENT:  Head: Normocephalic and atraumatic.  Right Ear: External ear normal.  Left Ear: External ear normal.  Eyes: Conjunctivae are normal. Right eye exhibits no discharge. Left eye exhibits no discharge. No scleral icterus.  Neck: Neck supple. No tracheal deviation present.  Cardiovascular: Normal rate, regular rhythm and intact distal pulses.   Pulmonary/Chest: Effort normal and breath sounds normal. No stridor. No respiratory distress. She has no wheezes. She has no rales. She exhibits bony tenderness.    Abdominal: Soft. Bowel sounds are normal. She exhibits no distension. There is no tenderness. There is no rebound and no guarding.  Musculoskeletal: She exhibits no edema.       Right shoulder: She exhibits no tenderness, no bony tenderness and no swelling.       Left shoulder: She exhibits tenderness. She exhibits no bony tenderness and no swelling.       Right wrist: She exhibits no tenderness, no bony tenderness and no swelling.       Left wrist: She exhibits no tenderness, no bony tenderness and no swelling.       Right hip: She exhibits normal range of motion, no tenderness, no bony tenderness and no swelling.        Left hip: She exhibits normal range of motion, no tenderness and no bony tenderness.       Right ankle: She exhibits no swelling. No tenderness.       Left ankle: She exhibits no swelling. No tenderness.       Cervical back: She exhibits no tenderness, no bony tenderness and no swelling.       Thoracic back: She exhibits no tenderness, no bony tenderness and no swelling.       Lumbar back: She exhibits no tenderness, no bony tenderness and no swelling.  Neurological: She is alert. She has normal strength. No cranial nerve deficit (no facial droop, extraocular movements intact, no slurred speech) or sensory deficit. She exhibits normal muscle tone. She displays no seizure activity. Coordination normal.  Skin: Skin is warm and dry. No rash noted.  Psychiatric: She has a normal mood and affect.  Nursing note and vitals reviewed.    ED Treatments / Results  Labs (all labs ordered are listed, but only abnormal results are displayed) Labs Reviewed  COMPREHENSIVE METABOLIC PANEL - Abnormal; Notable for the following:       Result Value   Glucose, Bld 103 (*)    Total Protein 6.4 (*)    Albumin 3.3 (*)    ALT 12 (*)    All other components within normal limits  CBC WITH DIFFERENTIAL/PLATELET - Abnormal; Notable for the following:    RBC 3.84 (*)    Hemoglobin 11.4 (*)    HCT 34.2 (*)    All other components within normal limits    EKG  EKG Interpretation  Date/Time:  Thursday September 14 2016 14:02:26 EDT Ventricular Rate:  79 PR Interval:    QRS Duration: 90 QT Interval:  401 QTC Calculation: 460 R Axis:   1 Text Interpretation:  Sinus rhythm LVH by voltage Borderline T abnormalities, inferior leads No significant change since last tracing Confirmed by Lamaj Metoyer  MD-J,  [place] [phone]) on 09/14/2016 2:06:51 PM       Radiology Dg Ribs Unilateral W/chest Left  Result Date: 09/14/2016 CLINICAL DATA:  82 y/o F; status post fall with left-sided chest pain. EXAM: LEFT RIBS AND CHEST - 3+  VIEW COMPARISON:  02/26/2014 CT of abdomen and pelvis. FINDINGS: No fracture or other bone lesions are seen involving the ribs. There is no evidence of pneumothorax or pleural effusion. Both lungs are clear. Stable cardiomediastinal silhouette. Large hiatal hernia. Moderate reverse S curvature of the spine with extent degenerative changes of the lumbar spine. IMPRESSION: 1. No acute fracture is identified. 2. Large hiatal hernia, probably stable from prior CT. Electronically Signed   By: Kristine Garbe M.D.   On: 09/14/2016 18:40   Dg Shoulder Left  Result Date: 09/14/2016 CLINICAL DATA:  82 y/o F; status post fall 2 days ago with generalized left-sided chest pain. EXAM: LEFT SHOULDER - 2+ VIEW COMPARISON:  10/15/2013 left shoulder radiographs. FINDINGS: Severe osteoarthrosis of the left glenohumeral joint with remodeling of the glenoid, periarticular osteophytes, and intra-articular bodies. Decreased acromial humeral interval and remodeling of the undersurface of the acromion compatible with chronic supraspinatus tear. No acute fracture or dislocation of the shoulder is identified. No significant interval change from prior radiographs. IMPRESSION: No acute fracture or dislocation is identified. Chronic severe degenerative changes of the left shoulder joint. Electronically Signed   By: Kristine Garbe M.D.   On: 09/14/2016 18:35    Procedures Procedures (including critical care time)  Medications Ordered in ED Medications - No data to display   Initial Impression / Assessment and Plan / ED Course  I have reviewed the triage vital signs and the nursing notes.  Pertinent labs & imaging results that were available during my care of the patient were reviewed by me and considered in my medical decision making (see chart for details).  Clinical Course    Xrays negative for fx.  Labs are reassuring.  History is consistent with a mechanical fall.   Stable for discharge.  OTC pain  meds prn  Final Clinical Impressions(s) / ED Diagnoses   Final diagnoses:  AC (acromioclavicular) joint arthritis  Chest wall injury, initial encounter    New Prescriptions New Prescriptions   No medications on file     Dorie Rank, MD 09/14/16 [phone]

## 2016-09-15 NOTE — ED Notes (Signed)
This pt was d/c on shift arrival. All assessments and v/s were done before my arrival. The pt was never taken out of the computer by myself as I was I was still getting into the rhyme  Of my nightly shift assignment. The pt's son was started not feeling well c/o of chest pain acute and later had a troponin 0.03, I made the decision to move quickly on direct pt care of pts son, who was admitted later into the hospital. The sons patient care to priority and I forgot to remove this patient from the charts until the following day, in which I was reminded.  I take full responsibility.  Mingo Amber rn bsn

## 2016-09-21 ENCOUNTER — Ambulatory Visit: Payer: Medicare Other

## 2016-10-01 ENCOUNTER — Other Ambulatory Visit: Payer: Self-pay | Admitting: Gastroenterology

## 2016-10-03 ENCOUNTER — Telehealth: Payer: Self-pay | Admitting: Gastroenterology

## 2016-10-03 NOTE — Telephone Encounter (Signed)
In a telephone encounter you mentioned that pt should not stay on this dose for a long period of time. Looks like she has been taking Protonix 40mg  bid for 2 months. Can she still stay at this dose or do you have new insructions?

## 2016-10-03 NOTE — Telephone Encounter (Signed)
Thanks for the note. Is 40mg  BID controlling her symptoms? If so, we can try to lower to 40mg  once daily for a month. If she does well with that then we can try to taper down to 20mg  if she tolerates. If she has significant symptoms on once daily dosing at 40mg , she can take it BID PRN to control symptoms. Thanks

## 2016-10-04 ENCOUNTER — Ambulatory Visit: Payer: Medicare Other | Admitting: Family Medicine

## 2016-10-04 ENCOUNTER — Telehealth: Payer: Self-pay | Admitting: Gastroenterology

## 2016-10-04 NOTE — Telephone Encounter (Signed)
Pt needs a refill for Pantoprozole

## 2016-10-04 NOTE — Telephone Encounter (Signed)
Patient calling back regarding this.  °

## 2016-10-04 NOTE — Telephone Encounter (Signed)
Patient called the anwering service at 7pm looking for her refill. Does not want to run out.

## 2016-10-04 NOTE — Telephone Encounter (Signed)
Rx sent to pharmacy pt informed

## 2016-10-11 ENCOUNTER — Ambulatory Visit (INDEPENDENT_AMBULATORY_CARE_PROVIDER_SITE_OTHER): Payer: Medicare Other | Admitting: Family Medicine

## 2016-10-11 ENCOUNTER — Encounter: Payer: Self-pay | Admitting: Family Medicine

## 2016-10-11 VITALS — BP 102/68 | HR 97 | Temp 97.8°F | Resp 18 | Ht 59.0 in | Wt 111.6 lb

## 2016-10-11 DIAGNOSIS — L298 Other pruritus: Secondary | ICD-10-CM

## 2016-10-11 DIAGNOSIS — Z23 Encounter for immunization: Secondary | ICD-10-CM

## 2016-10-11 DIAGNOSIS — I10 Essential (primary) hypertension: Secondary | ICD-10-CM | POA: Diagnosis not present

## 2016-10-11 DIAGNOSIS — L84 Corns and callosities: Secondary | ICD-10-CM | POA: Diagnosis not present

## 2016-10-11 DIAGNOSIS — M2041 Other hammer toe(s) (acquired), right foot: Secondary | ICD-10-CM

## 2016-10-11 DIAGNOSIS — R634 Abnormal weight loss: Secondary | ICD-10-CM | POA: Diagnosis not present

## 2016-10-11 DIAGNOSIS — N898 Other specified noninflammatory disorders of vagina: Secondary | ICD-10-CM

## 2016-10-11 LAB — POCT URINALYSIS DIP (MANUAL ENTRY)
Bilirubin, UA: NEGATIVE
Glucose, UA: NEGATIVE
Ketones, POC UA: NEGATIVE
Leukocytes, UA: NEGATIVE
Nitrite, UA: NEGATIVE
PROTEIN UA: NEGATIVE
RBC UA: NEGATIVE
SPEC GRAV UA: 1.01
UROBILINOGEN UA: 0.2
pH, UA: 7.5

## 2016-10-11 MED ORDER — MIRTAZAPINE 7.5 MG PO TABS: 7.5000 mg | ORAL_TABLET | Freq: Every day | ORAL | 2 refills | Status: DC

## 2016-10-11 NOTE — Patient Instructions (Signed)
     IF you received an x-ray today, you will receive an invoice from [facility]. Please contact [facility] at [phone] with questions or concerns regarding your invoice.   IF you received labwork today, you will receive an invoice from [facility]. Please contact [facility] at [phone] with questions or concerns regarding your invoice.   Our billing staff will not be able to assist you with questions regarding bills from these companies.  You will be contacted with the lab results as soon as they are available. The fastest way to get your results is to activate your My Chart account. Instructions are located on the last page of this paperwork. If you have not heard from us regarding the results in 2 weeks, please contact this office.      

## 2016-10-11 NOTE — Progress Notes (Signed)
Subjective:    Patient ID: [name], female    DOB: [birth year], 82 y.o.   MRN: [id number]  10/11/2016  Follow-up (for weight loss, ears,and urinary issues)   HPI This 82 y.o. female presents for TRANSITION INTO CARE.  Turned in the front room; lost balance and fell.  Presented to ED.  Hit the table.  L sided chest pain; s/p L rib films; negative.  Now not having any pain or pleuritic chest pain.  ED visit 09/14/16.  S/p L shoulder xray; no acute fracture; +severe arthritis in L shoulder.  Hit head; no loss of consciousness.  Denies fever/chills/sweats; denies SOB, cough.   Unintentional weight loss: weight up one pound.  No appetite.  Feels good; raking leaves every day.  Enjoys to rake leaves.  Wants medication to improve appetite.  Drinking 1-2 ensure per day..  Wt Readings from Last 3 Encounters:  10/21/16 112 lb 6.4 oz (51 kg)  10/11/16 111 lb 9.6 oz (50.6 kg)  09/14/16 110 lb (49.9 kg)    Vaginal itching: chronic issue for patient. provided urine sample; itches once daily at nighttime.  No vaginal discharge; no blood in stool.  No dysuria, urgency.   HTN: verapamil, metoprolol, HCTZ.  Rare dizziness.  Does not check BP at home.  Takes diuretic twice weekly.  BP Readings from Last 3 Encounters:  10/22/16 136/67  10/21/16 122/72  10/11/16 102/68   R shoulder pain: having a difficult time to reach above head.  Rakes leaves every day without difficulties.  No neck pain; no n/t/w.   Review of Systems  Constitutional: Positive for appetite change. Negative for chills, diaphoresis, fatigue, fever and unexpected weight change.  Eyes: Negative for visual disturbance.  Respiratory: Negative for cough, chest tightness and shortness of breath.   Cardiovascular: Negative for chest pain, palpitations and leg swelling.  Gastrointestinal: Negative for abdominal pain, constipation, diarrhea, nausea and vomiting.  Endocrine: Negative for cold intolerance, heat intolerance, polydipsia,  polyphagia and polyuria.  Genitourinary: Negative for dysuria, frequency, genital sores, hematuria, pelvic pain, urgency, vaginal bleeding, vaginal discharge and vaginal pain.  Musculoskeletal: Positive for arthralgias.  Neurological: Negative for dizziness, tremors, seizures, syncope, facial asymmetry, speech difficulty, weakness, light-headedness, numbness and headaches.  Psychiatric/Behavioral: Negative for dysphoric mood. The patient is not nervous/anxious.     Past Medical History:  Diagnosis Date  . Abnormal finding on Pap smear, ASCUS   . Acute renal insufficiency   . Amputation finger    index  . Barrett's esophagus   . CAD (coronary artery disease)   . Dementia   . Diverticulosis   . Elevated BP   . Foot deformity   . GERD (gastroesophageal reflux disease)   . Glaucoma   . H/O: GI bleed   . HSV-2 (herpes simplex virus 2) infection   . Hyperglycemia   . Hypertension   . Migraine   . Osteoporosis    worsening  . Spondylosis    Past Surgical History:  Procedure Laterality Date  . AMPUTATION FINGER / THUMB     secondary to osteomyelitis  . COLONOSCOPY    . ESOPHAGOGASTRODUODENOSCOPY N/A 10/22/2016   Procedure: ESOPHAGOGASTRODUODENOSCOPY (EGD);  Surgeon: Doran Stabler, MD;  Location: [facility] END[facility]COPY;  Service: Endoscopy;  Laterality: N/A;  . FOOT SURGERY     right  . PARTIAL HYSTERECTOMY    . UPPER GASTROINTESTINAL ENDOSCOPY     No Known Allergies Current Outpatient Prescriptions  Medication Sig Dispense Refill  . aspirin 81 MG tablet  Take 81 mg by mouth every morning.     . Calcium Carbonate-Vitamin D (CALCIUM-VITAMIN D) 500-200 MG-UNIT tablet Take 3 tablets by mouth every morning.    . conjugated estrogens (PREMARIN) vaginal cream Place 1 Applicatorful vaginally once a week.     . cyclobenzaprine (FLEXERIL) 5 MG tablet TAKE 1/2 TO 1 TABLET BY MOUTH  UP TO TWICE DAILY AS NEEDED FOR MUSCLE SPASMS (Patient taking differently: Take 2.5-5 mg by mouth up to twice  daily as needed for muscle spasms) 20 tablet 3  . desonide (DESOWEN) 0.05 % cream Apply 1 application topically 2 (two) times daily as needed (dry skin).     . ferrous sulfate 325 (65 FE) MG tablet Take 325 mg by mouth daily with breakfast.      . gabapentin (NEURONTIN) 800 MG tablet take 1 tablet by mouth at bedtime (Patient taking differently: Take 800 mg by mouth at bedtime) 90 tablet 1  . hydrochlorothiazide (MICROZIDE) 12.5 MG capsule take 1 capsule by mouth EVERY TUESDAY AND SATURDAY (Patient taking differently: Take 12.5 mg by mouth EVERY TUESDAY AND SATURDAY) 27 capsule 1  . hydroxypropyl methylcellulose (ISOPTO TEARS) 2.5 % ophthalmic solution Place 1 drop into both eyes 2 (two) times daily as needed for dry eyes.     Marland Kitchen lactose free nutrition (BOOST) LIQD Take 237 mLs by mouth 2 (two) times daily between meals. (Patient taking differently: Take 1 Container by mouth 2 (two) times daily as needed (nutritional). ) 60 Can 11  . metoprolol succinate (TOPROL-XL) 50 MG 24 hr tablet take 1 tablet by mouth once daily (Patient taking differently: Take 50 mg by mouth once every morning) 90 tablet 1  . Multiple Vitamins-Minerals (HAIR/SKIN/NAILS PO) Take 1 tablet by mouth every morning.     . nystatin (MYCOSTATIN) cream Apply 1 application topically as needed (dry skin). Reported on 03/30/2016    . promethazine (PHENERGAN) 25 MG tablet take 1 tablet by mouth twice a day if needed for nausea and vomiting (Patient taking differently: Take 25 mg by mouth twice a day if needed for nausea and vomiting) 30 tablet 0  . traMADol-acetaminophen (ULTRACET) 37.5-325 MG tablet TAKE 1 TABLET BY MOUTH EVERY 12 HOURS AS NEEDED FOR PAIN 60 tablet 2  . verapamil (CALAN-SR) 240 MG CR tablet take 1 tablet by mouth once daily (Patient taking differently: Take 240 mg by mouth once every morning) 90 tablet 1  . vitamin C (ASCORBIC ACID) 500 MG tablet Take 500 mg by mouth daily.    . mirtazapine (REMERON) 7.5 MG tablet Take 1  tablet (7.5 mg total) by mouth at bedtime. 30 tablet 2  . pantoprazole (PROTONIX) 40 MG tablet Take 1 tablet (40 mg total) by mouth daily. Take one tablet daily for 1 month. If doing well decrease to 20mg  daily. 60 tablet 1  . pantoprazole (PROTONIX) 40 MG tablet Take 1 tablet (40 mg total) by mouth daily. 30 tablet 0   No current facility-administered medications for this visit.    Facility-Administered Medications Ordered in Other Visits  Medication Dose Route Frequency Provider Last Rate Last Dose  . aspirin EC tablet 81 mg  81 mg Oral Daily Asiyah Cletis Media, MD   81 mg at 10/22/16 1213  . cefTRIAXone (ROCEPHIN) 1 g in dextrose 5 % 50 mL IVPB  1 g Intravenous Q24H Romona Curls, RPH   1 g at 10/22/16 0035  . [START ON 10/23/2016] Influenza vac split quadrivalent PF (FLUARIX) injection 0.5 mL  0.5  mL Intramuscular Tomorrow-1000 Todd D McDiarmid, MD      . metoprolol succinate (TOPROL-XL) 24 hr tablet 50 mg  50 mg Oral Daily Asiyah Cletis Media, MD   50 mg at 10/22/16 1147  . mirtazapine (REMERON) tablet 7.5 mg  7.5 mg Oral QHS Asiyah Cletis Media, MD   7.5 mg at 10/21/16 2210  . pantoprazole (PROTONIX) EC tablet 40 mg  40 mg Oral Daily [name], MD   40 mg at 10/22/16 1147  . polyvinyl alcohol (LIQUIFILM TEARS) 1.4 % ophthalmic solution 1 drop  1 drop Both Eyes BID PRN Blane Ohara McDiarmid, MD      . sodium chloride flush (NS) 0.9 % injection 3 mL  3 mL Intravenous Q12H Asiyah Cletis Media, MD   3 mL at 10/21/16 2217  . verapamil (CALAN-SR) CR tablet 240 mg  240 mg Oral Daily Asiyah Cletis Media, MD   240 mg at 10/22/16 1610   Social History   Social History  . Marital status: Single    Spouse name: N/A  . Number of children: 1  . Years of education: N/A   Occupational History  .  Retired   Social History Main Topics  . Smoking status: Never Smoker  . Smokeless tobacco: Never Used  . Alcohol use No  . Drug use: No  . Sexual activity: No   Other Topics Concern  . Not on  file   Social History Narrative  . No narrative on file   Family History  Problem Relation Age of Onset  . Breast cancer Mother   . Heart attack Mother   . Heart disease Father   . Alcohol abuse Son   . Colon cancer Neg Hx   . Stomach cancer Neg Hx   . Rectal cancer Neg Hx        Objective:    BP 102/68   Pulse 97   Temp 97.8 F (36.6 C) (Oral)   Resp 18   Ht 4\' 11"  (1.499 m)   Wt 111 lb 9.6 oz (50.6 kg)   SpO2 97%   BMI 22.54 kg/m  Physical Exam  Constitutional: She is oriented to person, place, and time. She appears well-developed and well-nourished. No distress.  HENT:  Head: Normocephalic and atraumatic.  Right Ear: Tympanic membrane, external ear and ear canal normal.  Left Ear: Tympanic membrane, external ear and ear canal normal.  Nose: Nose normal.  Mouth/Throat: Oropharynx is clear and moist.  Mild cerumen in B canals.  Eyes: Conjunctivae and EOM are normal. Pupils are equal, round, and reactive to light.  Neck: Normal range of motion. Neck supple. Carotid bruit is not present. No thyromegaly present.  Cardiovascular: Normal rate, regular rhythm, normal heart sounds and intact distal pulses.  Exam reveals no gallop and no friction rub.   No murmur heard. Pulmonary/Chest: Effort normal and breath sounds normal. She has no wheezes. She has no rales.  Abdominal: Soft. Bowel sounds are normal. She exhibits no distension and no mass. There is no tenderness. There is no rebound and no guarding.  Genitourinary: Vagina normal. There is no rash, tenderness or lesion on the right labia. There is no rash, tenderness or lesion on the left labia.  Musculoskeletal:       Right shoulder: She exhibits decreased range of motion. She exhibits no tenderness, no bony tenderness, no pain, no spasm and normal strength.  R SHOULDER: elevates R shoulder to 150 degrees only.   R FOOT: large  hammer toe deformity with large callus formation that is TTP without erythema or fluctuance or  ulceration.  Lymphadenopathy:    She has no cervical adenopathy.  Neurological: She is alert and oriented to person, place, and time. No cranial nerve deficit.  Skin: Skin is warm and dry. No rash noted. She is not diaphoretic. No erythema. No pallor.  Psychiatric: She has a normal mood and affect. Her behavior is normal.   Results for orders placed or performed in visit on 10/11/16  POCT urinalysis dipstick  Result Value Ref Range   Color, UA yellow yellow   Clarity, UA clear clear   Glucose, UA negative negative   Bilirubin, UA negative negative   Ketones, POC UA negative negative   Spec Grav, UA 1.010    Blood, UA negative negative   pH, UA 7.5    Protein Ur, POC negative negative   Urobilinogen, UA 0.2    Nitrite, UA Negative Negative   Leukocytes, UA Negative Negative       Assessment & Plan:   1. Loss of weight   2. Acquired hammer toe of right foot   3. Vaginal itching   4. Callus of foot   5. Flu vaccine need   6. Essential hypertension    -weight is stable today; agreeable to rx for Remeron qhs to stimulate appetite.   -R hammer toe deformity with large callus formation; refer to podiatry for consultation of hammer toe and for callus treatment. -chronic vaginal itching; normal u/a in office; recommend restarting premarin cream. -blood pressure well controlled; monitor closely at each visit; borderline low readings; asymptomatic currently; may need to decrease dose of medication if remains low.   Orders Placed This Encounter  Procedures  . Flu Vaccine QUAD 36+ mos IM  . Ambulatory referral to Podiatry    Referral Priority:   Routine    Referral Type:   Consultation    Referral Reason:   Specialty Services Required    Requested Specialty:   Podiatry    Number of Visits Requested:   1  . POCT urinalysis dipstick   Meds ordered this encounter  Medications  . mirtazapine (REMERON) 7.5 MG tablet    Sig: Take 1 tablet (7.5 mg total) by mouth at bedtime.     Dispense:  30 tablet    Refill:  2    Return in about 3 months (around 01/11/2017) for recheck.   Kwaku Mostafa Elayne Guerin, M.D. [facility] [address] [place],   [address] [phone] phone [phone] fax

## 2016-10-16 ENCOUNTER — Telehealth: Payer: Self-pay

## 2016-10-16 NOTE — Telephone Encounter (Signed)
Pt is wanting to make sure that dr Tamala Julian gives her a referral to a new foot doctor-she currently sees Tuchman and does not like him   Best number [phone]

## 2016-10-17 NOTE — Telephone Encounter (Signed)
Spoke with pt to tell her that  She ws referred to a new podiatrist.

## 2016-10-20 ENCOUNTER — Telehealth: Payer: Self-pay | Admitting: Gastroenterology

## 2016-10-20 NOTE — Telephone Encounter (Signed)
Instructed patient to go to the ER for evaluation of melena. She understands and prefers to go to [facility]. She states she did take her phenergan and she is feeling some better but will go to ER.

## 2016-10-20 NOTE — Telephone Encounter (Signed)
Patient called reported that she ate a fruit cup (pineapple, peaches) yesterday and felt nauseated after that made herself vomit and reports that it was a liquid black. Denies coffee ground appearance, denies fever or abdominal pain, or blood in stool. She is able to drink gingerale. Trying to eat some grits. She has phenergan but has not taken it. Please advise.

## 2016-10-20 NOTE — Telephone Encounter (Signed)
Please advise patient to go to ER for evaluation and may need observation/admission given she is having melena, concerning for upper GI bleed

## 2016-10-21 ENCOUNTER — Ambulatory Visit (INDEPENDENT_AMBULATORY_CARE_PROVIDER_SITE_OTHER)
Admission: EM | Admit: 2016-10-21 | Discharge: 2016-10-21 | Disposition: A | Discharge status: Nursing Home (Includes ICF) | Payer: Medicare Other | Source: Home / Self Care | Attending: Internal Medicine | Admitting: Internal Medicine

## 2016-10-21 ENCOUNTER — Observation Stay (HOSPITAL_COMMUNITY)
Admission: EM | Admit: 2016-10-21 | Discharge: 2016-10-22 | Disposition: A | Discharge status: Home / Self Care | Payer: Medicare Other | Attending: Family Medicine | Admitting: Family Medicine

## 2016-10-21 ENCOUNTER — Encounter (HOSPITAL_COMMUNITY): Payer: Self-pay | Admitting: *Deleted

## 2016-10-21 DIAGNOSIS — M810 Age-related osteoporosis without current pathological fracture: Secondary | ICD-10-CM | POA: Diagnosis not present

## 2016-10-21 DIAGNOSIS — K922 Gastrointestinal hemorrhage, unspecified: Principal | ICD-10-CM | POA: Diagnosis present

## 2016-10-21 DIAGNOSIS — Z8711 Personal history of peptic ulcer disease: Secondary | ICD-10-CM | POA: Insufficient documentation

## 2016-10-21 DIAGNOSIS — H04123 Dry eye syndrome of bilateral lacrimal glands: Secondary | ICD-10-CM | POA: Diagnosis not present

## 2016-10-21 DIAGNOSIS — K228 Other specified diseases of esophagus: Secondary | ICD-10-CM | POA: Diagnosis not present

## 2016-10-21 DIAGNOSIS — G4700 Insomnia, unspecified: Secondary | ICD-10-CM | POA: Diagnosis not present

## 2016-10-21 DIAGNOSIS — K5710 Diverticulosis of small intestine without perforation or abscess without bleeding: Secondary | ICD-10-CM | POA: Diagnosis not present

## 2016-10-21 DIAGNOSIS — Z79899 Other long term (current) drug therapy: Secondary | ICD-10-CM | POA: Diagnosis not present

## 2016-10-21 DIAGNOSIS — R195 Other fecal abnormalities: Secondary | ICD-10-CM | POA: Insufficient documentation

## 2016-10-21 DIAGNOSIS — F0390 Unspecified dementia without behavioral disturbance: Secondary | ICD-10-CM | POA: Diagnosis not present

## 2016-10-21 DIAGNOSIS — I2510 Atherosclerotic heart disease of native coronary artery without angina pectoris: Secondary | ICD-10-CM | POA: Diagnosis not present

## 2016-10-21 DIAGNOSIS — I10 Essential (primary) hypertension: Secondary | ICD-10-CM | POA: Insufficient documentation

## 2016-10-21 DIAGNOSIS — I959 Hypotension, unspecified: Secondary | ICD-10-CM | POA: Diagnosis not present

## 2016-10-21 DIAGNOSIS — K920 Hematemesis: Secondary | ICD-10-CM | POA: Diagnosis present

## 2016-10-21 DIAGNOSIS — K219 Gastro-esophageal reflux disease without esophagitis: Secondary | ICD-10-CM | POA: Diagnosis not present

## 2016-10-21 DIAGNOSIS — Z7982 Long term (current) use of aspirin: Secondary | ICD-10-CM | POA: Insufficient documentation

## 2016-10-21 DIAGNOSIS — K449 Diaphragmatic hernia without obstruction or gangrene: Secondary | ICD-10-CM | POA: Insufficient documentation

## 2016-10-21 DIAGNOSIS — Z8719 Personal history of other diseases of the digestive system: Secondary | ICD-10-CM

## 2016-10-21 DIAGNOSIS — N390 Urinary tract infection, site not specified: Secondary | ICD-10-CM | POA: Diagnosis not present

## 2016-10-21 DIAGNOSIS — R112 Nausea with vomiting, unspecified: Secondary | ICD-10-CM | POA: Diagnosis not present

## 2016-10-21 DIAGNOSIS — D62 Acute posthemorrhagic anemia: Secondary | ICD-10-CM

## 2016-10-21 DIAGNOSIS — Z79890 Hormone replacement therapy (postmenopausal): Secondary | ICD-10-CM | POA: Insufficient documentation

## 2016-10-21 DIAGNOSIS — R630 Anorexia: Secondary | ICD-10-CM | POA: Diagnosis present

## 2016-10-21 DIAGNOSIS — K921 Melena: Secondary | ICD-10-CM

## 2016-10-21 LAB — URINALYSIS, ROUTINE W REFLEX MICROSCOPIC
Bilirubin Urine: NEGATIVE
GLUCOSE, UA: NEGATIVE mg/dL
HGB URINE DIPSTICK: NEGATIVE
Ketones, ur: NEGATIVE mg/dL
Nitrite: POSITIVE — AB
PH: 6 (ref 5.0–8.0)
Protein, ur: NEGATIVE mg/dL
SPECIFIC GRAVITY, URINE: 1.013 (ref 1.005–1.030)

## 2016-10-21 LAB — URINE MICROSCOPIC-ADD ON

## 2016-10-21 LAB — COMPREHENSIVE METABOLIC PANEL
ALBUMIN: 3.2 g/dL — AB (ref 3.5–5.0)
ALK PHOS: 41 U/L (ref 38–126)
ALT: 13 U/L — ABNORMAL LOW (ref 14–54)
ANION GAP: 7 (ref 5–15)
AST: 19 U/L (ref 15–41)
BILIRUBIN TOTAL: 0.2 mg/dL — AB (ref 0.3–1.2)
BUN: 18 mg/dL (ref 6–20)
CALCIUM: 9.1 mg/dL (ref 8.9–10.3)
CO2: 30 mmol/L (ref 22–32)
Chloride: 106 mmol/L (ref 101–111)
Creatinine, Ser: 0.95 mg/dL (ref 0.44–1.00)
GFR calc Af Amer: >60 mL/min (ref >60)
GFR, EST NON AFRICAN AMERICAN: 55 mL/min — AB (ref >60)
GLUCOSE: 101 mg/dL — AB (ref 65–99)
Potassium: 4.2 mmol/L (ref 3.5–5.1)
Sodium: 143 mmol/L (ref 135–145)
TOTAL PROTEIN: 6.2 g/dL — AB (ref 6.5–8.1)

## 2016-10-21 LAB — CBC
HCT: 33 % — ABNORMAL LOW (ref 36.0–46.0)
Hemoglobin: 10.5 g/dL — ABNORMAL LOW (ref 12.0–15.0)
MCH: 27.3 pg (ref 26.0–34.0)
MCHC: 31.8 g/dL (ref 30.0–36.0)
MCV: 85.7 fL (ref 78.0–100.0)
Platelets: 350 10*3/uL (ref 150–400)
RBC: 3.85 MIL/uL — ABNORMAL LOW (ref 3.87–5.11)
RDW: 13.7 % (ref 11.5–15.5)
WBC: 6.3 10*3/uL (ref 4.0–10.5)

## 2016-10-21 LAB — TYPE AND SCREEN
ABO/RH(D): O POS
ANTIBODY SCREEN: NEGATIVE

## 2016-10-21 LAB — POC OCCULT BLOOD, ED: Fecal Occult Bld: POSITIVE — AB

## 2016-10-21 LAB — LIPASE, BLOOD: Lipase: 23 U/L (ref 11–51)

## 2016-10-21 MED ORDER — GABAPENTIN 800 MG PO TABS
800.0000 mg | ORAL_TABLET | Freq: Every day | ORAL | Status: DC
  Filled 2016-10-21: qty 1

## 2016-10-21 MED ORDER — SODIUM CHLORIDE 0.9 % IV SOLN
80.0000 mg | Freq: Once | INTRAVENOUS | Status: AC
  Administered 2016-10-21: 80 mg via INTRAVENOUS
  Filled 2016-10-21: qty 80

## 2016-10-21 MED ORDER — SODIUM CHLORIDE 0.9 % IV SOLN
8.0000 mg/h | INTRAVENOUS | Status: DC
  Administered 2016-10-21: 8 mg/h via INTRAVENOUS
  Filled 2016-10-21 (×2): qty 80

## 2016-10-21 MED ORDER — PANTOPRAZOLE SODIUM 40 MG IV SOLR: 40.0000 mg | Freq: Two times a day (BID) | INTRAVENOUS | Status: DC

## 2016-10-21 MED ORDER — METOPROLOL SUCCINATE ER 50 MG PO TB24
50.0000 mg | ORAL_TABLET | Freq: Every day | ORAL | Status: DC
  Administered 2016-10-22: 50 mg via ORAL
  Filled 2016-10-21: qty 1

## 2016-10-21 MED ORDER — ESTROGENS, CONJUGATED 0.625 MG/GM VA CREA
1.0000 | TOPICAL_CREAM | VAGINAL | Status: DC
  Filled 2016-10-21: qty 30

## 2016-10-21 MED ORDER — GABAPENTIN 400 MG PO CAPS
800.0000 mg | ORAL_CAPSULE | Freq: Every day | ORAL | Status: DC
  Filled 2016-10-21: qty 2

## 2016-10-21 MED ORDER — DEXTROSE 5 % IV SOLN
1.0000 g | INTRAVENOUS | Status: DC
  Administered 2016-10-22: 1 g via INTRAVENOUS
  Filled 2016-10-21 (×2): qty 10

## 2016-10-21 MED ORDER — FAMOTIDINE IN NACL 20-0.9 MG/50ML-% IV SOLN: 20.0000 mg | INTRAVENOUS | Status: DC

## 2016-10-21 MED ORDER — VERAPAMIL HCL ER 240 MG PO TBCR
240.0000 mg | EXTENDED_RELEASE_TABLET | Freq: Every day | ORAL | Status: DC
  Administered 2016-10-22: 240 mg via ORAL
  Filled 2016-10-21 (×2): qty 1

## 2016-10-21 MED ORDER — SODIUM CHLORIDE 0.9% FLUSH
3.0000 mL | Freq: Two times a day (BID) | INTRAVENOUS | Status: DC
  Administered 2016-10-21: 3 mL via INTRAVENOUS

## 2016-10-21 MED ORDER — DEXTROSE-NACL 5-0.45 % IV SOLN
INTRAVENOUS | Status: DC
  Administered 2016-10-21 – 2016-10-22 (×2): via INTRAVENOUS

## 2016-10-21 MED ORDER — PANTOPRAZOLE SODIUM 40 MG IV SOLR
8.0000 mg/h | INTRAVENOUS | Status: DC
  Administered 2016-10-21 – 2016-10-22 (×2): 8 mg/h via INTRAVENOUS
  Filled 2016-10-21 (×4): qty 80

## 2016-10-21 MED ORDER — ASPIRIN EC 81 MG PO TBEC
81.0000 mg | DELAYED_RELEASE_TABLET | Freq: Every day | ORAL | Status: DC
  Administered 2016-10-22: 81 mg via ORAL
  Filled 2016-10-21: qty 1

## 2016-10-21 MED ORDER — POLYVINYL ALCOHOL 1.4 % OP SOLN
1.0000 [drp] | Freq: Two times a day (BID) | OPHTHALMIC | Status: DC | PRN
  Filled 2016-10-21: qty 15

## 2016-10-21 MED ORDER — HYPROMELLOSE (GONIOSCOPIC) 2.5 % OP SOLN: 1.0000 [drp] | Freq: Two times a day (BID) | OPHTHALMIC | Status: DC | PRN

## 2016-10-21 MED ORDER — MIRTAZAPINE 7.5 MG PO TABS
7.5000 mg | ORAL_TABLET | Freq: Every day | ORAL | Status: DC
  Administered 2016-10-21: 7.5 mg via ORAL
  Filled 2016-10-21: qty 1

## 2016-10-21 NOTE — ED Provider Notes (Signed)
[facility] DEPT Provider Note   CSN: [id number] Arrival date & time: 10/21/16  1304     History   Chief Complaint Chief Complaint  Patient presents with  . Abdominal Pain  . GI Bleeding    reports dark colored emesis and stools    HPI   Blood pressure (!) 97/51, pulse 75, temperature 98.3 F (36.8 C), temperature source Oral, weight 52.3 kg, SpO2 96 %.  [name] is a 82 y.o. female symptoms from urgent care for evaluation of possible GI bleed. Patient states that she had an episode of dark emesis after feeling sick after eating pineapple 2 evenings ago. The emesis was not spontaneous. She states that she felt nauseous and put her hand down her throat to help her vomit. She has not been vomiting since that time. She had some mild epigastric discomfort associated with it. She reports a darker than normal stools states that sometimes they're quite black and sometimes they're white. She takes iron regularly, this is not new for her. She takes a enteric-coated 81 mg aspirin daily with no other NSAIDs including Goody powder, Aleve, ibuprofen. She does not drink alcohol. Chart review shows that she has a history of gastric ulcer and history of GI bleed. She is followed at [facility]. Last EGD was January 2017.   Past Medical History:  Diagnosis Date  . Abnormal finding on Pap smear, ASCUS   . Acute renal insufficiency   . Amputation finger    index  . Barrett's esophagus   . CAD (coronary artery disease)   . Dementia   . Diverticulosis   . Elevated BP   . Foot deformity   . GERD (gastroesophageal reflux disease)   . Glaucoma   . H/O: GI bleed   . HSV-2 (herpes simplex virus 2) infection   . Hyperglycemia   . Hypertension   . Migraine   . Osteoporosis    worsening  . Spondylosis     Patient Active Problem List   Diagnosis Date Noted  . Upper GI bleed 10/21/2016  . Rectal bleeding 11/25/2014  . Gastroesophageal reflux disease without esophagitis 07/31/2014  . Loss  of weight 07/31/2014  . Gastric ulcer without hemorrhage or perforation 07/22/2013  . Syphilis 02/27/2012  . Dementia   . Spondylosis   . Foot deformity   . Migraine   . CAD (coronary artery disease)   . Acute renal insufficiency   . GERD (gastroesophageal reflux disease)   . Hyperglycemia   . HSV-2 (herpes simplex virus 2) infection   . Abnormal finding on Pap smear, ASCUS   . Osteoporosis   . CONSTIPATION 12/12/2010  . NONSPECIFIC ABNORMAL FINDING IN STOOL CONTENTS 12/12/2010  . INFECTION, SKIN AND SOFT TISSUE 11/10/2010  . ABDOMINAL PAIN RIGHT UPPER QUADRANT 11/10/2010  . PUD, HX OF 11/10/2010  . Hemorrhage of rectum and anus 02/21/2010  . ANEMIA, IRON DEFICIENCY 06/17/2008  . BARRETTS ESOPHAGUS 06/17/2008  . HYPERLIPIDEMIA 06/16/2008  . GLAUCOMA 06/16/2008  . Essential hypertension 06/16/2008  . DIVERTICULOSIS, COLON 06/16/2008  . HEMOCCULT POSITIVE STOOL 06/16/2008  . ARTHRITIS 06/16/2008  . SLEEP APNEA 06/16/2008    Past Surgical History:  Procedure Laterality Date  . AMPUTATION FINGER / THUMB     secondary to osteomyelitis  . COLONOSCOPY    . FOOT SURGERY     right  . PARTIAL HYSTERECTOMY    . UPPER GASTROINTESTINAL ENDOSCOPY      OB History    No data available  Home Medications    Prior to Admission medications   Medication Sig Start Date End Date Taking? Authorizing Provider  aspirin 81 MG tablet Take 81 mg by mouth every morning.    Yes Historical Provider, MD  Calcium Carbonate-Vitamin D (CALCIUM-VITAMIN D) 500-200 MG-UNIT tablet Take 3 tablets by mouth every morning.   Yes Historical Provider, MD  conjugated estrogens (PREMARIN) vaginal cream Place 1 Applicatorful vaginally once a week.    Yes Historical Provider, MD  cyclobenzaprine (FLEXERIL) 5 MG tablet TAKE 1/2 TO 1 TABLET BY MOUTH  UP TO TWICE DAILY AS NEEDED FOR MUSCLE SPASMS Patient taking differently: Take 2.5-5 mg by mouth up to twice daily as needed for muscle spasms 06/13/16  Yes  [name], MD  desonide (DESOWEN) 0.05 % cream Apply 1 application topically 2 (two) times daily as needed (dry skin).    Yes Historical Provider, MD  ferrous sulfate 325 (65 FE) MG tablet Take 325 mg by mouth daily with breakfast.     Yes Historical Provider, MD  gabapentin (NEURONTIN) 800 MG tablet take 1 tablet by mouth at bedtime Patient taking differently: Take 800 mg by mouth at bedtime 06/09/16  Yes [name], MD  hydrochlorothiazide (MICROZIDE) 12.5 MG capsule take 1 capsule by mouth EVERY TUESDAY AND SATURDAY Patient taking differently: Take 12.5 mg by mouth EVERY TUESDAY AND SATURDAY 06/16/16  Yes [name], MD  hydroxypropyl methylcellulose (ISOPTO TEARS) 2.5 % ophthalmic solution Place 1 drop into both eyes 2 (two) times daily as needed for dry eyes.    Yes Historical Provider, MD  lactose free nutrition (BOOST) LIQD Take 237 mLs by mouth 2 (two) times daily between meals. Patient taking differently: Take 1 Container by mouth 2 (two) times daily as needed (nutritional).  03/22/16  Yes [name], MD  metoprolol succinate (TOPROL-XL) 50 MG 24 hr tablet take 1 tablet by mouth once daily Patient taking differently: Take 50 mg by mouth once every morning 06/06/16  Yes Chelle Jeffery, PA-C  mirtazapine (REMERON) 7.5 MG tablet Take 1 tablet (7.5 mg total) by mouth at bedtime. 10/11/16  Yes Wardell Honour, MD  Multiple Vitamins-Minerals (HAIR/SKIN/NAILS PO) Take 1 tablet by mouth every morning.    Yes Historical Provider, MD  nystatin (MYCOSTATIN) cream Apply 1 application topically as needed (dry skin). Reported on 03/30/2016   Yes Historical Provider, MD  pantoprazole (PROTONIX) 40 MG tablet Take 1 tablet (40 mg total) by mouth daily. Take one tablet daily for 1 month. If doing well decrease to 20mg  daily. 10/04/16  Yes Manus Gunning, MD  promethazine (PHENERGAN) 25 MG tablet take 1 tablet by mouth twice a day if needed for nausea and vomiting Patient taking differently:  Take 25 mg by mouth twice a day if needed for nausea and vomiting 07/25/16  Yes Manus Gunning, MD  traMADol-acetaminophen (ULTRACET) 37.5-325 MG tablet TAKE 1 TABLET BY MOUTH EVERY 12 HOURS AS NEEDED FOR PAIN 05/25/16  Yes [name], MD  verapamil (CALAN-SR) 240 MG CR tablet take 1 tablet by mouth once daily Patient taking differently: Take 240 mg by mouth once every morning 05/13/16  Yes Jaynee Eagles, PA-C  vitamin C (ASCORBIC ACID) 500 MG tablet Take 500 mg by mouth daily.   Yes Historical Provider, MD    Family History Family History  Problem Relation Age of Onset  . Breast cancer Mother   . Heart attack Mother   . Heart disease Father   . Alcohol  abuse Son   . Colon cancer Neg Hx   . Stomach cancer Neg Hx   . Rectal cancer Neg Hx     Social History Social History  Substance Use Topics  . Smoking status: Never Smoker  . Smokeless tobacco: Never Used  . Alcohol use No     Allergies   Review of patient's allergies indicates no known allergies.   Review of Systems Review of Systems  10 systems reviewed and found to be negative, except as noted in the HPI.   Physical Exam Updated Vital Signs BP 118/67   Pulse 76   Temp 98.3 F (36.8 C) (Oral)   Wt 52.3 kg   SpO2 97%   BMI 23.28 kg/m   Physical Exam  Constitutional: She is oriented to person, place, and time. She appears well-developed and well-nourished. No distress.  Alert, talkative and well-appearing  HENT:  Head: Normocephalic and atraumatic.  Mouth/Throat: Oropharynx is clear and moist.  No Conjunctival pallor  Eyes: Conjunctivae and EOM are normal. Pupils are equal, round, and reactive to light.  Neck: Normal range of motion.  Cardiovascular: Normal rate, regular rhythm and intact distal pulses.   Pulmonary/Chest: Effort normal and breath sounds normal.  Abdominal: Soft. She exhibits no distension and no mass. There is no tenderness. There is no rebound and no guarding. No hernia.    Genitourinary:  Genitourinary Comments: Digital rectal exam a chaperoned by technician [name]: Normal rectal tone, dark greenish stool color. Normally formed stool.  Musculoskeletal: Normal range of motion.  Neurological: She is alert and oriented to person, place, and time.  Skin: Capillary refill takes less than 2 seconds. She is not diaphoretic.  Psychiatric: She has a normal mood and affect.  Nursing note and vitals reviewed.    ED Treatments / Results  Labs (all labs ordered are listed, but only abnormal results are displayed) Labs Reviewed  COMPREHENSIVE METABOLIC PANEL - Abnormal; Notable for the following:       Result Value   Glucose, Bld 101 (*)    Total Protein 6.2 (*)    Albumin 3.2 (*)    ALT 13 (*)    Total Bilirubin 0.2 (*)    GFR calc non Af Amer 55 (*)    All other components within normal limits  CBC - Abnormal; Notable for the following:    RBC 3.85 (*)    Hemoglobin 10.5 (*)    HCT 33.0 (*)    All other components within normal limits  URINALYSIS, ROUTINE W REFLEX MICROSCOPIC (NOT AT [facility]) - Abnormal; Notable for the following:    APPearance CLOUDY (*)    Nitrite POSITIVE (*)    Leukocytes, UA SMALL (*)    All other components within normal limits  URINE MICROSCOPIC-ADD ON - Abnormal; Notable for the following:    Squamous Epithelial / LPF 0-5 (*)    Bacteria, UA FEW (*)    All other components within normal limits  POC OCCULT BLOOD, ED - Abnormal; Notable for the following:    Fecal Occult Bld POSITIVE (*)    All other components within normal limits  LIPASE, BLOOD  TYPE AND SCREEN    EKG  EKG Interpretation None       Radiology No results found.  Procedures Procedures (including critical care time)  Medications Ordered in ED Medications  pantoprazole (PROTONIX) 80 mg in sodium chloride 0.9 % 250 mL (0.32 mg/mL) infusion (not administered)  pantoprazole (PROTONIX) 80 mg in sodium chloride 0.9 %  100 mL IVPB (not administered)      Initial Impression / Assessment and Plan / ED Course  I have reviewed the triage vital signs and the nursing notes.  Pertinent labs & imaging results that were available during my care of the patient were reviewed by me and considered in my medical decision making (see chart for details).  Clinical Course    Vitals:   10/21/16 1630 10/21/16 1645 10/21/16 1700 10/21/16 1715  BP: (!) 102/45 125/76 121/72 118/67  Pulse: 73 75 75 76  Temp:      TempSrc:      SpO2: 96% 99% 99% 97%  Weight:        Medications  pantoprazole (PROTONIX) 80 mg in sodium chloride 0.9 % 250 mL (0.32 mg/mL) infusion (not administered)  pantoprazole (PROTONIX) 80 mg in sodium chloride 0.9 % 100 mL IVPB (not administered)    [name] is 82 y.o. female presenting with Dark emesis and dark stool onset 2 days ago. Patient takes a daily low-dose aspirin. Hemoglobin is 10.5, this is down from 11.4 one month ago. Abdominal exam is benign. Vital signs stable. Digital rectal exam with dark stool however, she does take iron regularly. BUN is not elevated, normal platelets.  Gastroenterology from [facility] Dr. Loletha Carrow appreciated: Recommends admission, will likely scope in a.m.  Patient is a patient of [facility], family medicine will admit, discussed with resident Dr. Andy Gauss.   Patient updated.   Final Clinical Impressions(s) / ED Diagnoses   Final diagnoses:  Upper GI bleed    New Prescriptions New Prescriptions   No medications on file     Monico Blitz, PA-C 10/21/16 1807    Leonard Schwartz, MD 10/21/16 [id number]

## 2016-10-21 NOTE — H&P (Signed)
[facility] Admission History and Physical Service Pager: [phone]  Patient name: Taiylor Kayal Medical record number: [id number] Date of birth: 1933-09-01 Age: 82 y.o. Gender: female  Primary Care Provider: Reginia Forts, MD Consultants: Gastroenterology Code Status: Full   Chief Complaint:  Jet black emesis and dark stools  Assessment and Plan: Markee Mclendon is a 82 y.o. female presenting with a past medical history significant for HTN, GERD, CAD  who presented with recent episode of tarry looking emesis, nausea, loss of appetite and dark stools consistent with Upper GI bleed in the setting of her history of gastric ulcer disease.  #Tarry black emesis, and melena, acute Patient presented with recent episode of what was described as jet black emesis. Patient has a history of superficial gastric ulcer seen on upper endoscopy in January 2017. Patient has been omeprazole. Baseline hemoglobin 11.5-12.5. On admission hemoglobin was 10.5. Rectal exam in the ED was consistent for melena. Patient was FOBT positive. Clinical presentation and laboratory findings highly suggestive of a slow GI bleed possibly secondary to PPI (omeprazole) non adherence or diet. Patient also has a history of concerning for Barrett's esophagus. Patient also reports dark tarry stool consistent with melena. This findings are consistent with upper GI bleed, thought BUN/Creatinine ratio is only 18 ( less than 30) However patient has been on iron chronically, which could also cause dark stools. Differential would be upper GI bleed vs esophageal carcinoma (less likely). --Admit to FMTS telemetry, admitting physician Dr. McDiarmid --Make patient NPO --Upper endoscopy with [facility] in the a.m. (consulted by ED provider) --Start Protonix drip for 72 hours  --Zofran 4 mg prn q4 --Follow-up on a.m. CBC and BMP --Start IV fluid D51/2NS 100 cc/hr --Will follow up on GI recs. -- Type and Screen -- peripheral  IVs in place  -- Monitor vitals - specifically HR and blood pressure   -- CBC and BMET in the AM   #UTI, acute Patient presented with no specific urinary symptoms, she denied frequency dysuria but endorses itchiness (treated for vaginal atrophy). In the ED, urinalysis showed small leukocytes  and positive for nitrite highly suggestive of a UTI. Given patient's age, and non- specific description of itching;discomfort when asked about pain with urination, we will treat. --Start patient on ceftriaxone 1 g daily 10/28-day 1 --Follow-up urine culture --Continue to monitor -- urine culture  #Hypertension, chronic Blood pressure on admission 97/51, slightly hypotensive. Patient states she has not been drinking much for the past two days, therefore likely in part to due dehydration; quickly improved with fluid resuscitation hours. Will resume home regimen post endoscopy. --Hold Metoprolol succinate 50 mg po daily --Hold Verapamil CR 240 mg   #CAD, Chronic- with chart review could not determine reason for diagnosis of CAD. Cardiac Cath in 2003 noted no signs coronary artery disease.  We will manage home regimen. --Continue aspirin 81 mg daily  #Anemia: Normocytic. Baseline Hgb (11.5-12.5). Hgb 10.5 today. Denies any chest pain, dizziness, fatigue  - CBC in the AM  #Dry Eyes  Will continue home meds. --Polyvinyl alcohol 1.5% ophthalmic solution 1 drop in both eyes twice a day  #Insomnia --Remeron 7.5 mg qhs  FEN/GI: D5 1/2 NS, NPO Prophylaxis: SCD  Disposition: Admit to FMTS, observation overnight pending GI workup, will most likely be scoped in the morning by [facility].  History of Present Illness:  Ryian Ladage is a 82 y.o. female with a past medical history significant for who presented with tarry emesis and dark stool.  Patient reports that on Thursday she was at home and was eating some pineapple (unclear fresh or out of the can) when she all of sudden started to experience burning in  her chest and epigastric region and felt "sick as dog". Patient endorsed some intense nausea  Patient reported that in order to make herself feel better, she decided induce emesis by sticking  her finger down her throat. Patient then describes "jet black vomit, like I have never seen before in my life ". Patient states she felt better right away. Furthermore, patient have been noticing some dark stools , but is not clear how long it has been going on, since she is on iron pill and have had dark stools in the past.  Patient endorses specks of blood in her stools intermittently, especially when she is constipated. In the ED, patient was started on Protonix with a positive rectal exam for melena, positive FOBT and positive guaiac.Of note patient had an upper endoscopy in January done by Dr. Havery Moros. Finding were concerning for barrett's esophagus, as well 7 cm hiatal hernia. Superficial gastric ulceration were also found. Patient was told to avoid NSAIDs and continue omeprazole. Patient has been taking 81 mg aspirin (enteric coated). Patient indicates she has avoided NSAIDs.   Review Of Systems: Per HPI with the following additions:   Review of Systems  Constitutional: Positive for weight loss. Negative for malaise/fatigue.  HENT: Negative for congestion.   Eyes: Negative for blurred vision and double vision.  Respiratory: Negative for cough and shortness of breath.   Cardiovascular: Negative for chest pain and palpitations.  Gastrointestinal: Positive for melena, nausea and vomiting.  Genitourinary: Negative for dysuria and frequency.  Musculoskeletal: Negative for myalgias and neck pain.  Skin: Negative for itching and rash.  Neurological: Negative for dizziness, weakness and headaches.    Patient Active Problem List   Diagnosis Date Noted  . Upper GI bleed 10/21/2016  . Rectal bleeding 11/25/2014  . Gastroesophageal reflux disease without esophagitis 07/31/2014  . Loss of weight 07/31/2014   . Gastric ulcer without hemorrhage or perforation 07/22/2013  . Syphilis 02/27/2012  . Dementia   . Spondylosis   . Foot deformity   . Migraine   . CAD (coronary artery disease)   . Acute renal insufficiency   . GERD (gastroesophageal reflux disease)   . Hyperglycemia   . HSV-2 (herpes simplex virus 2) infection   . Abnormal finding on Pap smear, ASCUS   . Osteoporosis   . CONSTIPATION 12/12/2010  . NONSPECIFIC ABNORMAL FINDING IN STOOL CONTENTS 12/12/2010  . INFECTION, SKIN AND SOFT TISSUE 11/10/2010  . ABDOMINAL PAIN RIGHT UPPER QUADRANT 11/10/2010  . PUD, HX OF 11/10/2010  . Hemorrhage of rectum and anus 02/21/2010  . ANEMIA, IRON DEFICIENCY 06/17/2008  . BARRETTS ESOPHAGUS 06/17/2008  . HYPERLIPIDEMIA 06/16/2008  . GLAUCOMA 06/16/2008  . Essential hypertension 06/16/2008  . DIVERTICULOSIS, COLON 06/16/2008  . HEMOCCULT POSITIVE STOOL 06/16/2008  . ARTHRITIS 06/16/2008  . SLEEP APNEA 06/16/2008    Past Medical History: Past Medical History:  Diagnosis Date  . Abnormal finding on Pap smear, ASCUS   . Acute renal insufficiency   . Amputation finger    index  . Barrett's esophagus   . CAD (coronary artery disease)   . Dementia   . Diverticulosis   . Elevated BP   . Foot deformity   . GERD (gastroesophageal reflux disease)   . Glaucoma   . H/O: GI bleed   . HSV-2 (herpes simplex  virus 2) infection   . Hyperglycemia   . Hypertension   . Migraine   . Osteoporosis    worsening  . Spondylosis     Past Surgical History: Past Surgical History:  Procedure Laterality Date  . AMPUTATION FINGER / THUMB     secondary to osteomyelitis  . COLONOSCOPY    . FOOT SURGERY     right  . PARTIAL HYSTERECTOMY    . UPPER GASTROINTESTINAL ENDOSCOPY      Social History: Social History  Substance Use Topics  . Smoking status: Never Smoker  . Smokeless tobacco: Never Used  . Alcohol use No   Additional social history:   Please also refer to relevant sections of  EMR.  Family History: Family History  Problem Relation Age of Onset  . Breast cancer Mother   . Heart attack Mother   . Heart disease Father   . Alcohol abuse Son   . Colon cancer Neg Hx   . Stomach cancer Neg Hx   . Rectal cancer Neg Hx    (If not completed, MUST add something in)  Allergies and Medications: No Known Allergies No current facility-administered medications on file prior to encounter.    Current Outpatient Prescriptions on File Prior to Encounter  Medication Sig Dispense Refill  . aspirin 81 MG tablet Take 81 mg by mouth every morning.     . Calcium Carbonate-Vitamin D (CALCIUM-VITAMIN D) 500-200 MG-UNIT tablet Take 3 tablets by mouth every morning.    . conjugated estrogens (PREMARIN) vaginal cream Place 1 Applicatorful vaginally once a week.     . cyclobenzaprine (FLEXERIL) 5 MG tablet TAKE 1/2 TO 1 TABLET BY MOUTH  UP TO TWICE DAILY AS NEEDED FOR MUSCLE SPASMS (Patient taking differently: Take 2.5-5 mg by mouth up to twice daily as needed for muscle spasms) 20 tablet 3  . desonide (DESOWEN) 0.05 % cream Apply 1 application topically 2 (two) times daily as needed (dry skin).     . ferrous sulfate 325 (65 FE) MG tablet Take 325 mg by mouth daily with breakfast.      . gabapentin (NEURONTIN) 800 MG tablet take 1 tablet by mouth at bedtime (Patient taking differently: Take 800 mg by mouth at bedtime) 90 tablet 1  . hydrochlorothiazide (MICROZIDE) 12.5 MG capsule take 1 capsule by mouth EVERY TUESDAY AND SATURDAY (Patient taking differently: Take 12.5 mg by mouth EVERY TUESDAY AND SATURDAY) 27 capsule 1  . hydroxypropyl methylcellulose (ISOPTO TEARS) 2.5 % ophthalmic solution Place 1 drop into both eyes 2 (two) times daily as needed for dry eyes.     Marland Kitchen lactose free nutrition (BOOST) LIQD Take 237 mLs by mouth 2 (two) times daily between meals. (Patient taking differently: Take 1 Container by mouth 2 (two) times daily as needed (nutritional). ) 60 Can 11  . metoprolol  succinate (TOPROL-XL) 50 MG 24 hr tablet take 1 tablet by mouth once daily (Patient taking differently: Take 50 mg by mouth once every morning) 90 tablet 1  . mirtazapine (REMERON) 7.5 MG tablet Take 1 tablet (7.5 mg total) by mouth at bedtime. 30 tablet 2  . Multiple Vitamins-Minerals (HAIR/SKIN/NAILS PO) Take 1 tablet by mouth every morning.     . nystatin (MYCOSTATIN) cream Apply 1 application topically as needed (dry skin). Reported on 03/30/2016    . pantoprazole (PROTONIX) 40 MG tablet Take 1 tablet (40 mg total) by mouth daily. Take one tablet daily for 1 month. If doing well decrease to 20mg  daily. [place]  tablet 1  . promethazine (PHENERGAN) 25 MG tablet take 1 tablet by mouth twice a day if needed for nausea and vomiting (Patient taking differently: Take 25 mg by mouth twice a day if needed for nausea and vomiting) 30 tablet 0  . traMADol-acetaminophen (ULTRACET) 37.5-325 MG tablet TAKE 1 TABLET BY MOUTH EVERY 12 HOURS AS NEEDED FOR PAIN 60 tablet 2  . verapamil (CALAN-SR) 240 MG CR tablet take 1 tablet by mouth once daily (Patient taking differently: Take 240 mg by mouth once every morning) 90 tablet 1  . vitamin C (ASCORBIC ACID) 500 MG tablet Take 500 mg by mouth daily.      Objective: BP 112/67   Pulse 77   Temp 98.3 F (36.8 C) (Oral)   Resp 18   Wt 115 lb 4 oz (52.3 kg)   SpO2 97%   BMI 23.28 kg/m  Exam: Physical Exam: General Appearance:  Elderly woman in good spirit and very talkative, in no acute distress, able to answer question and participate in exam. Head/face:  NCAT Eyes:  PERRL and EOMI Mouth/Throat:  Mucosa moist, no lesions; pharynx without erythema, edema or exudate, poor dentition with several missing teeth. Neck:  Supple, no mass, non-tender, no bruits, no jvd, Adenopathy- absent Lungs:  Normal expansion.  Clear to auscultation.  No rales, rhonchi, or wheezing. Heart:  Normal S1, S2.  Regular rate and rhythm without murmur, gallop or rub. Abdomen:  Soft,  non-tender, normal bowel sounds; no bruits, organomegaly or masses.Rectal exam performed the ED provider and not repeated. Skin:  Skin color, texture, turgor are normal; there are no bruises, rashes or lesions. Extremities: Extremities warm to touch, pink, with no edema. and pulses present in all extremities  Musculoskeletal:  Spine range of motion normal. Muscular strength intact. Severe hallus varus bilaterally with overriding second toe Neurologic:  Alert and oriented x 3, gait normal., reflexes normal and symmetric, strength and  sensation grossly normal Psych exam: Alert,oriented, in NAD with a full range of affect, normal behavior and no psychotic features   Labs and Imaging: CBC BMET   Recent Labs Lab 10/21/16 1359  WBC 6.3  HGB 10.5*  HCT 33.0*  PLT 350    Recent Labs Lab 10/21/16 1359  NA 143  K 4.2  CL 106  CO2 30  BUN 18  CREATININE 0.95  GLUCOSE 101*  CALCIUM 9.1     Urinalysis    Component Value Date/Time   COLORURINE YELLOW 10/21/2016 1633   APPEARANCEUR CLOUDY (A) 10/21/2016 1633   LABSPEC 1.013 10/21/2016 1633   PHURINE 6.0 10/21/2016 1633   GLUCOSEU NEGATIVE 10/21/2016 [place] 10/21/2016 [place] 10/21/2016 1633   BILIRUBINUR negative 10/11/2016 1147   BILIRUBINUR Negative 08/26/2015 [place] 10/21/2016 [place] 10/21/2016 1633   UROBILINOGEN 0.2 10/11/2016 1147   UROBILINOGEN 0.2 02/26/2014 1008   NITRITE POSITIVE (A) 10/21/2016 1633   LEUKOCYTESUR SMALL (A) 10/21/2016 1633     Marjie Skiff, MD 10/21/2016, 6:25 PM PGY-1, [facility] pager: [phone], text pages welcome  UPPER LEVEL ADDENDUM  I have read the above note and made revisions highlighted in blue.  Kerrin Mo, MD, PGY-2 [facility]

## 2016-10-21 NOTE — ED Notes (Signed)
Attempted to call report

## 2016-10-21 NOTE — Progress Notes (Signed)
Pharmacy Antibiotic Note  [name] is a 82 y.o. female admitted on 10/21/2016 with UTI.  Pharmacy has been consulted for ceftriaxone dosing. Afebrile, wbc wnl.  Plan: Ceftriaxone 1g IV q24h F/u clinical progress, LOT Not renally adjusted - Rx will sign off consult  Height: 4\' 11"  (149.9 cm) Weight: 112 lb 6.4 oz (51 kg) IBW/kg (Calculated) : 43.2  Temp (24hrs), Avg:98.5 F (36.9 C), Min:98.3 F (36.8 C), Max:98.8 F (37.1 C)   Recent Labs Lab 10/21/16 1359  WBC 6.3  CREATININE 0.95    Estimated Creatinine Clearance: 31.7 mL/min (by C-G formula based on SCr of 0.95 mg/dL).    No Known Allergies    Elicia Lamp, PharmD, BCPS Clinical Pharmacist 10/21/2016 9:35 PM

## 2016-10-21 NOTE — ED Notes (Signed)
Report  Phoned  To     [facility]

## 2016-10-21 NOTE — Discharge Instructions (Signed)
YOU SHOULD BE SEEN IN THE EMERGENCY DEPARTMENT TODAY.

## 2016-10-21 NOTE — ED Provider Notes (Signed)
CSN: [id number]     Arrival date & time 10/21/16  1200 History   First MD Initiated Contact with Patient 10/21/16 1223     Chief Complaint  Patient presents with  . Abdominal Pain   (Consider location/radiation/quality/duration/timing/severity/associated sxs/prior Treatment) HPI NP 82 Y/O FEMALE STATES THAT SINCE Thursday SHE HAS BLACK VOMIT AND BLACK STOOLS. SOME UPPER EPIGASTRIC PAIN. DOES NOT WANT TO EAT. WAS TOLD TO GO TO ER YESTERDAY BY DOCTOR BUT WAS UNABLE TO GET RIDE AND DID NOT WANT TO USE AN AMBULANCE. STATES SHE DOES TAKE IRON, BUT THAT MAKES HER STOOLS DARK, NOT BLACK.  Past Medical History:  Diagnosis Date  . Abnormal finding on Pap smear, ASCUS   . Acute renal insufficiency   . Amputation finger    index  . Barrett's esophagus   . CAD (coronary artery disease)   . Dementia   . Diverticulosis   . Elevated BP   . Foot deformity   . GERD (gastroesophageal reflux disease)   . Glaucoma   . H/O: GI bleed   . HSV-2 (herpes simplex virus 2) infection   . Hyperglycemia   . Hypertension   . Migraine   . Osteoporosis    worsening  . Spondylosis    Past Surgical History:  Procedure Laterality Date  . AMPUTATION FINGER / THUMB     secondary to osteomyelitis  . COLONOSCOPY    . FOOT SURGERY     right  . PARTIAL HYSTERECTOMY    . UPPER GASTROINTESTINAL ENDOSCOPY     Family History  Problem Relation Age of Onset  . Breast cancer Mother   . Heart attack Mother   . Heart disease Father   . Alcohol abuse Son   . Colon cancer Neg Hx   . Stomach cancer Neg Hx   . Rectal cancer Neg Hx    Social History  Substance Use Topics  . Smoking status: Never Smoker  . Smokeless tobacco: Never Used  . Alcohol use No   OB History    No data available     Review of Systems  Denies: HEADACHE, NAUSEA, ABDOMINAL PAIN, CHEST PAIN, CONGESTION, DYSURIA, SHORTNESS OF BREATH  Allergies  Review of patient's allergies indicates no known allergies.  Home Medications   Prior  to Admission medications   Medication Sig Start Date End Date Taking? Authorizing Provider  aspirin 81 MG tablet Take 81 mg by mouth every morning.     Historical Provider, MD  Calcium Carbonate-Vitamin D (CALCIUM-VITAMIN D) 500-200 MG-UNIT tablet Take 3 tablets by mouth every morning.    Historical Provider, MD  conjugated estrogens (PREMARIN) vaginal cream Place 1 Applicatorful vaginally once a week.     Historical Provider, MD  cyclobenzaprine (FLEXERIL) 5 MG tablet TAKE 1/2 TO 1 TABLET BY MOUTH  UP TO TWICE DAILY AS NEEDED FOR MUSCLE SPASMS Patient taking differently: Take 2.5-5 mg by mouth up to twice daily as needed for muscle spasms 06/13/16   [name], MD  desonide (DESOWEN) 0.05 % cream Apply 1 application topically 2 (two) times daily as needed (dry skin).     Historical Provider, MD  ferrous sulfate 325 (65 FE) MG tablet Take 325 mg by mouth daily with breakfast.      Historical Provider, MD  gabapentin (NEURONTIN) 800 MG tablet take 1 tablet by mouth at bedtime Patient taking differently: Take 800 mg by mouth at bedtime 06/09/16   [name], MD  hydrochlorothiazide (MICROZIDE) 12.5 MG capsule take 1  capsule by mouth EVERY TUESDAY AND SATURDAY Patient taking differently: Take 12.5 mg by mouth EVERY TUESDAY AND SATURDAY 06/16/16   [name], MD  hydroxypropyl methylcellulose (ISOPTO TEARS) 2.5 % ophthalmic solution Place 1 drop into both eyes 2 (two) times daily as needed for dry eyes.     Historical Provider, MD  lactose free nutrition (BOOST) LIQD Take 237 mLs by mouth 2 (two) times daily between meals. Patient taking differently: Take 1 Container by mouth 2 (two) times daily as needed (nutritional).  03/22/16   [name], MD  metoprolol succinate (TOPROL-XL) 50 MG 24 hr tablet take 1 tablet by mouth once daily Patient taking differently: Take 50 mg by mouth once every morning 06/06/16   Chelle Jeffery, PA-C  mirtazapine (REMERON) 7.5 MG tablet Take 1 tablet (7.5 mg total)  by mouth at bedtime. 10/11/16   Wardell Honour, MD  Multiple Vitamins-Minerals (HAIR/SKIN/NAILS PO) Take 1 tablet by mouth every morning.     Historical Provider, MD  nystatin (MYCOSTATIN) cream Apply 1 application topically as needed (dry skin). Reported on 03/30/2016    Historical Provider, MD  pantoprazole (PROTONIX) 40 MG tablet Take 1 tablet (40 mg total) by mouth daily. Take one tablet daily for 1 month. If doing well decrease to 20mg  daily. 10/04/16   Manus Gunning, MD  promethazine (PHENERGAN) 25 MG tablet take 1 tablet by mouth twice a day if needed for nausea and vomiting Patient taking differently: Take 25 mg by mouth twice a day if needed for nausea and vomiting 07/25/16   Manus Gunning, MD  traMADol-acetaminophen (ULTRACET) 37.5-325 MG tablet TAKE 1 TABLET BY MOUTH EVERY 12 HOURS AS NEEDED FOR PAIN 05/25/16   [name], MD  verapamil (CALAN-SR) 240 MG CR tablet take 1 tablet by mouth once daily Patient taking differently: Take 240 mg by mouth once every morning 05/13/16   Jaynee Eagles, PA-C  vitamin C (ASCORBIC ACID) 500 MG tablet Take 500 mg by mouth daily.    Historical Provider, MD   Meds Ordered and Administered this Visit  Medications - No data to display  BP 122/72 (BP Location: Left Arm)   Pulse 85   Temp 98.3 F (36.8 C) (Oral)   Resp 12   SpO2 97%  No data found.   Physical Exam NURSES NOTES AND VITAL SIGNS REVIEWED. CONSTITUTIONAL: Well developed, well nourished, no acute distress HEENT: normocephalic, atraumatic EYES: Conjunctiva normal NECK:normal ROM, supple, no adenopathy PULMONARY:No respiratory distress, normal effort ABDOMINAL: Soft, ND, NT BS+, No CVAT MUSCULOSKELETAL: Normal ROM of all extremities,  SKIN: warm and dry without rash PSYCHIATRIC: Mood and affect, behavior are normal  Urgent Care Course   Clinical Course    Procedures (including critical care time)  Labs Review Labs Reviewed - No data to display  Imaging Review No  results found.   Visual Acuity Review  Right Eye Distance:   Left Eye Distance:   Bilateral Distance:    Right Eye Near:   Left Eye Near:    Bilateral Near:         MDM   1. History of melena   2. History of hematemesis    SHOULD BE SEEN IN ED. VITAL SIGNS ARE STABLE WHICH WEIGHS AGAINST HYPOVOLEMIA, OR ACUTE BLOOD LOSS.     Konrad Felix, [place] 10/21/16 1259

## 2016-10-21 NOTE — ED Triage Notes (Signed)
Pt  Reports   Black  Vomitus        Pt  Reported  abd  Pain    Pt    Reports  Pain      Omn  Palpation       No  Vomiting   Since         Pt  Reports    She  Feels  Hungry

## 2016-10-21 NOTE — ED Notes (Signed)
Pharmacy contacted for medications

## 2016-10-21 NOTE — ED Triage Notes (Signed)
Patient is here due to having reported darks stools and emesis.  Patient states she did have pain but is has resolved.  She was seen at [facility] and sent to ED for further evaluation

## 2016-10-22 ENCOUNTER — Encounter (HOSPITAL_COMMUNITY): Payer: Self-pay | Admitting: *Deleted

## 2016-10-22 ENCOUNTER — Encounter (HOSPITAL_COMMUNITY)
Admission: EM | Disposition: A | Discharge status: Home / Self Care | Payer: Self-pay | Source: Home / Self Care | Attending: Emergency Medicine

## 2016-10-22 DIAGNOSIS — D62 Acute posthemorrhagic anemia: Secondary | ICD-10-CM | POA: Diagnosis not present

## 2016-10-22 DIAGNOSIS — K922 Gastrointestinal hemorrhage, unspecified: Secondary | ICD-10-CM

## 2016-10-22 DIAGNOSIS — R112 Nausea with vomiting, unspecified: Secondary | ICD-10-CM | POA: Diagnosis not present

## 2016-10-22 DIAGNOSIS — R1013 Epigastric pain: Secondary | ICD-10-CM

## 2016-10-22 HISTORY — PX: ESOPHAGOGASTRODUODENOSCOPY: SHX5428

## 2016-10-22 LAB — CBC
HEMATOCRIT: 29.8 % — AB (ref 36.0–46.0)
HEMOGLOBIN: 9.7 g/dL — AB (ref 12.0–15.0)
MCH: 26.4 pg (ref 26.0–34.0)
MCHC: 32.6 g/dL (ref 30.0–36.0)
MCV: 81.2 fL (ref 78.0–100.0)
Platelets: 223 10*3/uL (ref 150–400)
RBC: 3.67 MIL/uL — AB (ref 3.87–5.11)
RDW: 14.3 % (ref 11.5–15.5)
WBC: 9.7 10*3/uL (ref 4.0–10.5)

## 2016-10-22 LAB — COMPREHENSIVE METABOLIC PANEL
ALK PHOS: 37 U/L — AB (ref 38–126)
ALT: 12 U/L — ABNORMAL LOW (ref 14–54)
ANION GAP: 7 (ref 5–15)
AST: 15 U/L (ref 15–41)
Albumin: 2.7 g/dL — ABNORMAL LOW (ref 3.5–5.0)
BILIRUBIN TOTAL: 0.4 mg/dL (ref 0.3–1.2)
BUN: 12 mg/dL (ref 6–20)
CALCIUM: 8.5 mg/dL — AB (ref 8.9–10.3)
CO2: 27 mmol/L (ref 22–32)
Chloride: 108 mmol/L (ref 101–111)
Creatinine, Ser: 0.9 mg/dL (ref 0.44–1.00)
GFR, EST NON AFRICAN AMERICAN: 58 mL/min — AB (ref >60)
Glucose, Bld: 105 mg/dL — ABNORMAL HIGH (ref 65–99)
POTASSIUM: 3.2 mmol/L — AB (ref 3.5–5.1)
Sodium: 142 mmol/L (ref 135–145)
TOTAL PROTEIN: 5.3 g/dL — AB (ref 6.5–8.1)

## 2016-10-22 SURGERY — EGD (ESOPHAGOGASTRODUODENOSCOPY)
Anesthesia: Moderate Sedation

## 2016-10-22 MED ORDER — PANTOPRAZOLE SODIUM 40 MG PO TBEC: 40.0000 mg | DELAYED_RELEASE_TABLET | Freq: Every day | ORAL | 1 refills | Status: DC

## 2016-10-22 MED ORDER — MIDAZOLAM HCL 5 MG/ML IJ SOLN
INTRAMUSCULAR | Status: AC
  Filled 2016-10-22: qty 2

## 2016-10-22 MED ORDER — INFLUENZA VAC SPLIT QUAD 0.5 ML IM SUSY
0.5000 mL | PREFILLED_SYRINGE | INTRAMUSCULAR | Status: DC
  Filled 2016-10-22: qty 0.5

## 2016-10-22 MED ORDER — FENTANYL CITRATE (PF) 100 MCG/2ML IJ SOLN
INTRAMUSCULAR | Status: AC
  Filled 2016-10-22: qty 2

## 2016-10-22 MED ORDER — PANTOPRAZOLE SODIUM 40 MG PO TBEC
40.0000 mg | DELAYED_RELEASE_TABLET | Freq: Every day | ORAL | Status: DC
  Administered 2016-10-22: 40 mg via ORAL
  Filled 2016-10-22: qty 1

## 2016-10-22 MED ORDER — MIDAZOLAM HCL 10 MG/2ML IJ SOLN
INTRAMUSCULAR | Status: DC | PRN
  Administered 2016-10-22 (×3): 1 mg via INTRAVENOUS
  Administered 2016-10-22: 2 mg via INTRAVENOUS

## 2016-10-22 MED ORDER — PANTOPRAZOLE SODIUM 40 MG PO TBEC: 40.0000 mg | DELAYED_RELEASE_TABLET | Freq: Every day | ORAL | 0 refills | Status: DC

## 2016-10-22 MED ORDER — DIPHENHYDRAMINE HCL 50 MG/ML IJ SOLN
INTRAMUSCULAR | Status: AC
  Filled 2016-10-22: qty 1

## 2016-10-22 MED ORDER — BUTAMBEN-TETRACAINE-BENZOCAINE 2-2-14 % EX AERO
INHALATION_SPRAY | CUTANEOUS | Status: DC | PRN
  Administered 2016-10-22: 1 via TOPICAL

## 2016-10-22 NOTE — Progress Notes (Signed)
Family Medicine Teaching Service Daily Progress Note Intern Pager: [phone]  Patient name: [name] Medical record number: [id number] Date of birth: Jan 22, 1933 Age: 82 y.o. Gender: female  Primary Care Provider: Reginia Forts, MD Consultants: GI Code Status: Full code  Pt Overview and Major Events to Date:  1. Admit to FMTS 2. Endoscopy 10/29  Assessment and Plan: [name] is a 82 y.o. female presenting with a past medical history significant for HTN, GERD, CAD  who presented with recent episode of tarry looking emesis, nausea, loss of appetite and dark stools consistent with Upper GI bleed in the setting of her history of gastric ulcer disease.  Melena and coffee ground emesis Reported "jet black" emesis at home.  Had endoscopy on 1/17 that showed gastric ulcers.  Patient has been omeprazole. Baseline hemoglobin 11.5-12.5. On admission hemoglobin was 10.5. Rectal exam in the ED was consistent for melena. Patient was FOBT positive.  BUN normal.  Hemoglobin this AM was 9.7 down from 10.5 on admission.  Patient to have upper endoscopy this AM with  and was NPO at midnight.  Started on protonix drip in ED and VSS overnight.  --Continue protonix drip for 72 hours  --Zofran 4 mg prn q4 --Start IV fluid D51/2NS 100 cc/hr --GI following; appreciate recs --vital signs per floor protocol  UTI Reports non-specific itching and discomfort with urination. UA suggestive of UTI. Urine culture pending --continue ceftriaxone 1 g daily 10/28-day 1 --f/u ucx  Hypertension:  Blood pressure on admission 97/51, slightly hypotensive and improved with fluids. BP this AM was 129/73.   --Hold Metoprolol succinate 50 mg po daily --Hold Verapamil CR 240 mg   CAD Chronic- with chart review could not determine reason for diagnosis of CAD. Cardiac Cath in 2003 noted no signs coronary artery disease.  We will manage home regimen. --Continue aspirin 81 mg daily  Anemia Normocytic. Baseline Hgb  (11.5-12.5). Hgb 10.5 today. Denies any chest pain, dizziness, fatigue  - CBC in the AM  Dry eyes Will continue home meds. --Polyvinyl alcohol 1.5% ophthalmic solution 1 drop in both eyes twice a day  Insomnia Stable --Remeron 7.5 mg qhs   FEN/GI: D5 1/2NS @100cc /hr/ NPO PPx: SCD  Disposition: discharge pending medical improvement  Subjective:  Patient feels well this morning.  Does endorse some epigastric tenderness without rebounding.   Objective: Temp:  [98.3 F (36.8 C)-98.8 F (37.1 C)] 98.7 F (37.1 C) (10/29 0515) Pulse Rate:  [69-85] 74 (10/29 0515) Resp:  [12-18] 17 (10/29 0515) BP: (97-156)/(45-95) 129/73 (10/29 0515) SpO2:  [96 %-100 %] 100 % (10/29 0515) Weight:  [112 lb 6.4 oz (51 kg)-115 lb 4 oz (52.3 kg)] 112 lb 6.4 oz (51 kg) (10/28 2029) Physical Exam: General: 82 year old female lying in bed, well-appearing Cardiovascular: Regular rate and rhythm, no murmurs Respiratory: Normal work of breathing, clear to auscultation bilaterally, no wheezing or rhonchi Abdomen: Soft, mildly tender epigastric pain, nondistended Extremities: No cyanosis, clubbing or edema Psych: Normal mood and affect  Laboratory:  Recent Labs Lab 10/21/16 1359 10/22/16 0510  WBC 6.3 9.7  HGB 10.5* 9.7*  HCT 33.0* 29.8*  PLT 350 223    Recent Labs Lab 10/21/16 1359  NA 143  K 4.2  CL 106  CO2 30  BUN 18  CREATININE 0.95  CALCIUM 9.1  PROT 6.2*  BILITOT 0.2*  ALKPHOS 41  ALT 13*  AST 19  GLUCOSE 101*    Imaging/Diagnostic Tests: No results found.  Eloise Levels, MD 10/22/2016, 7:03 AM  PGY-1, [place] Intern pager: [phone], text pages welcome

## 2016-10-22 NOTE — H&P (View-Only) (Signed)
[facility] Consult Note   History [name] MRN # [id number]  Date of Admission: 10/21/2016 Date of Consultation: 10/22/2016 Referring physician: Dr. Blane Ohara McDiarmid, MD  Reason for Consultation/Chief Complaint: Black emesis and heme positive stool  Subjective  HPI:  This is an 82 year old woman admitted through the ED last evening for vomiting dark black material and heme positive stool. She is a very poor and tangential historian. It appears that she was here in January of this year with similar symptoms and was discovered to have a small gastric ulcer, perhaps related to taking Excedrin at that time. She reports that she no longer does that come but she does take an 81 mg aspirin daily. She says that she always has "a bad stomach", but it is difficult to characterize. 2 or 3 days ago she got some fruit from the local food Bank and says after eating the pineapple she felt nauseated. She was unable to vomit, so she put her fingers down her throat to induce vomiting and then vomited "jet black liquid". She still was feeling unwell and as if she could not eat anything for the next 24 hours, so she came to the ED last evening. Hemoglobin was 10.5 down from 11.5 earlier this year. The PA in the emergency department told me that the patient had formed but strongly heme positive black stool. She has had no hematemesis since admission. She seems to deny weight loss. She denies chest pain, and says she is only short of breath when she feels anxious. It is very difficult to obtain any remainder of review of systems.  ROS:   All other systems are negative except as noted above in the HPI, as near as can be determined.  Past Medical History Past Medical History:  Diagnosis Date  . Abnormal finding on Pap smear, ASCUS   . Acute renal insufficiency   . Amputation finger    index  . Barrett's esophagus   . CAD (coronary artery disease)   . Dementia   . Diverticulosis   . Elevated  BP   . Foot deformity   . GERD (gastroesophageal reflux disease)   . Glaucoma   . H/O: GI bleed   . HSV-2 (herpes simplex virus 2) infection   . Hyperglycemia   . Hypertension   . Migraine   . Osteoporosis    worsening  . Spondylosis     Past Surgical History Past Surgical History:  Procedure Laterality Date  . AMPUTATION FINGER / THUMB     secondary to osteomyelitis  . COLONOSCOPY    . FOOT SURGERY     right  . PARTIAL HYSTERECTOMY    . UPPER GASTROINTESTINAL ENDOSCOPY      Family History Family History  Problem Relation Age of Onset  . Breast cancer Mother   . Heart attack Mother   . Heart disease Father   . Alcohol abuse Son   . Colon cancer Neg Hx   . Stomach cancer Neg Hx   . Rectal cancer Neg Hx     Social History Social History   Social History  . Marital status: Single    Spouse name: N/A  . Number of children: 1  . Years of education: N/A   Occupational History  .  Retired   Social History Main Topics  . Smoking status: Never Smoker  . Smokeless tobacco: Never Used  . Alcohol use No  . Drug use: No  . Sexual activity: No   Other  Topics Concern  . None   Social History Narrative  . None    Allergies No Known Allergies  Outpatient Meds Home medications from the H+P and/or nursing med reconciliation reviewed.  Inpatient med list reviewed  _____________________________________________________________________ Objective   Exam:  Current vital signs  Patient Vitals for the past 8 hrs:  BP Temp Temp src Pulse Resp SpO2  10/22/16 0515 129/73 98.7 F (37.1 C) Oral 74 17 100 %    Intake/Output Summary (Last 24 hours) at 10/22/16 [id number] Last data filed at 10/22/16 0631  Gross per 24 hour  Intake          1167.51 ml  Output              825 ml  Net           342.51 ml    Physical Exam:    General: this is a Thin elderly female patient in no acute distress  Eyes: sclera anicteric, no redness  ENT: oral mucosa moist without  lesions, no cervical or supraclavicular lymphadenopathy, poor dentition  CV: RRR without murmur, S1/S2, no JVD,, no peripheral edema  Resp: Breath sounds somewhat decreased to auscultation left compared to right, no audible crackles or wheezing., normal RR and effort noted  GI: soft, mild epigastric tenderness to deep palpation, with active bowel sounds. No guarding or palpable organomegaly noted  Skin; warm and dry, no rash or jaundice noted  Neuro: awake, alert and oriented x 3. Normal gross motor function and fluent speech.  Labs:   Recent Labs Lab 10/21/16 1359 10/22/16 0510  WBC 6.3 9.7  HGB 10.5* 9.7*  HCT 33.0* 29.8*  PLT 350 223    Recent Labs Lab 10/22/16 0510  NA 142  K 3.2*  CL 108  CO2 27  BUN 12  ALBUMIN 2.7*  ALKPHOS 37*  ALT 12*  AST 15  GLUCOSE 105*   No results for input(s): INR in the last 168 hours.  Radiologic studies:   @ASSESSMENTPLANBEGIN @ Impression:  Hematemesis Acute blood loss anemia Heme positive stool (it should be noted that she takes iron tablets regularly) Epigastric pain Nausea and vomiting. It sounds as if she may have gotten ahold of something that to eat. However, the heme positive stool, known history of ulcer and chronic aspirin use raises suspicion for persistent ulcer.  She is certainly stable this morning, although her hemoglobin is somewhat lower than upon admission. Plan:  EGD this morning. She is agreeable after discussion of the procedure and risks. She is familiar with it, having had it done multiple times in the past.  The benefits and risks of the planned procedure were described in detail with the patient or (when appropriate) their health care proxy.  Risks were outlined as including, but not limited to, bleeding, infection, perforation, adverse medication reaction leading to cardiac or pulmonary decompensation, or pancreatitis (if ERCP).  The limitation of incomplete mucosal visualization was also discussed.   No guarantees or warranties were given.  Continue acid suppression and nothing by mouth status   Thank you for the courtesy of this consult.  Please contact me with any questions or concerns.  [name] Pager: [phone] Mon-Fri 8a-5p [phone] after 5p, weekends, holidays  CC: [facility]ist service Pricilla Holm M.D.

## 2016-10-22 NOTE — Interval H&P Note (Signed)
History and Physical Interval Note:  10/22/2016 10:25 AM  [name]  has presented today for surgery, with the diagnosis of hematemesis  The various methods of treatment have been discussed with the patient and family. After consideration of risks, benefits and other options for treatment, the patient has consented to  Procedure(s): ESOPHAGOGASTRODUODENOSCOPY (EGD) (N/A) as a surgical intervention .  The patient's history has been reviewed, patient examined, no change in status, stable for surgery.  I have reviewed the patient's chart and labs.  Questions were answered to the patient's satisfaction.     [name]

## 2016-10-22 NOTE — Progress Notes (Signed)
New Admission Note:   Arrival Method: Via stretcher from the ED Mental Orientation:  A & O x 4 with memory impairment Telemetry: Placed on Tele Box #[id number] Assessment: Completed Skin:  See assessment IV:  Left AC Pain:   Denies Tubes:  None Safety Measures: Safety Fall Prevention Plan has been given, discussed and signed Admission: Completed [facility] [place] Orientation: Patient has been orientated to the room, unit and staff.  Family:  From home with son who is disabled  Patient has multiple personal belongings at the bedside.  She refuses for them to be sent to the hospital safe.  She was advised of our hospital policy regarding personal belongings.  Orders have been reviewed and implemented. Will continue to monitor the patient. Call light has been placed within reach and bed alarm has been activated.   Earleen Reaper RN- [place], [place] Phone number: [phone]

## 2016-10-22 NOTE — Consult Note (Addendum)
[facility] Consult Note   History Evalee Huckeba MRN # [id number]  Date of Admission: 10/21/2016 Date of Consultation: 10/22/2016 Referring physician: Dr. Blane Ohara McDiarmid, MD  Reason for Consultation/Chief Complaint: Black emesis and heme positive stool  Subjective  HPI:  This is an 82 year old woman admitted through the ED last evening for vomiting dark black material and heme positive stool. She is a very poor and tangential historian. It appears that she was here in January of this year with similar symptoms and was discovered to have a small gastric ulcer, perhaps related to taking Excedrin at that time. She reports that she no longer does that come but she does take an 81 mg aspirin daily. She says that she always has "a bad stomach", but it is difficult to characterize. 2 or 3 days ago she got some fruit from the local food Bank and says after eating the pineapple she felt nauseated. She was unable to vomit, so she put her fingers down her throat to induce vomiting and then vomited "jet black liquid". She still was feeling unwell and as if she could not eat anything for the next 24 hours, so she came to the ED last evening. Hemoglobin was 10.5 down from 11.5 earlier this year. The PA in the emergency department told me that the patient had formed but strongly heme positive black stool. She has had no hematemesis since admission. She seems to deny weight loss. She denies chest pain, and says she is only short of breath when she feels anxious. It is very difficult to obtain any remainder of review of systems.  ROS:   All other systems are negative except as noted above in the HPI, as near as can be determined.  Past Medical History Past Medical History:  Diagnosis Date  . Abnormal finding on Pap smear, ASCUS   . Acute renal insufficiency   . Amputation finger    index  . Barrett's esophagus   . CAD (coronary artery disease)   . Dementia   . Diverticulosis   . Elevated  BP   . Foot deformity   . GERD (gastroesophageal reflux disease)   . Glaucoma   . H/O: GI bleed   . HSV-2 (herpes simplex virus 2) infection   . Hyperglycemia   . Hypertension   . Migraine   . Osteoporosis    worsening  . Spondylosis     Past Surgical History Past Surgical History:  Procedure Laterality Date  . AMPUTATION FINGER / THUMB     secondary to osteomyelitis  . COLONOSCOPY    . FOOT SURGERY     right  . PARTIAL HYSTERECTOMY    . UPPER GASTROINTESTINAL ENDOSCOPY      Family History Family History  Problem Relation Age of Onset  . Breast cancer Mother   . Heart attack Mother   . Heart disease Father   . Alcohol abuse Son   . Colon cancer Neg Hx   . Stomach cancer Neg Hx   . Rectal cancer Neg Hx     Social History Social History   Social History  . Marital status: Single    Spouse name: N/A  . Number of children: 1  . Years of education: N/A   Occupational History  .  Retired   Social History Main Topics  . Smoking status: Never Smoker  . Smokeless tobacco: Never Used  . Alcohol use No  . Drug use: No  . Sexual activity: No   Other  Topics Concern  . None   Social History Narrative  . None    Allergies No Known Allergies  Outpatient Meds Home medications from the H+P and/or nursing med reconciliation reviewed.  Inpatient med list reviewed  _____________________________________________________________________ Objective   Exam:  Current vital signs  Patient Vitals for the past 8 hrs:  BP Temp Temp src Pulse Resp SpO2  10/22/16 0515 129/73 98.7 F (37.1 C) Oral 74 17 100 %    Intake/Output Summary (Last 24 hours) at 10/22/16 [id number] Last data filed at 10/22/16 0631  Gross per 24 hour  Intake          1167.51 ml  Output              825 ml  Net           342.51 ml    Physical Exam:    General: this is a Thin elderly female patient in no acute distress  Eyes: sclera anicteric, no redness  ENT: oral mucosa moist without  lesions, no cervical or supraclavicular lymphadenopathy, poor dentition  CV: RRR without murmur, S1/S2, no JVD,, no peripheral edema  Resp: Breath sounds somewhat decreased to auscultation left compared to right, no audible crackles or wheezing., normal RR and effort noted  GI: soft, mild epigastric tenderness to deep palpation, with active bowel sounds. No guarding or palpable organomegaly noted  Skin; warm and dry, no rash or jaundice noted  Neuro: awake, alert and oriented x 3. Normal gross motor function and fluent speech.  Labs:   Recent Labs Lab 10/21/16 1359 10/22/16 0510  WBC 6.3 9.7  HGB 10.5* 9.7*  HCT 33.0* 29.8*  PLT 350 223    Recent Labs Lab 10/22/16 0510  NA 142  K 3.2*  CL 108  CO2 27  BUN 12  ALBUMIN 2.7*  ALKPHOS 37*  ALT 12*  AST 15  GLUCOSE 105*   No results for input(s): INR in the last 168 hours.  Radiologic studies:   @ASSESSMENTPLANBEGIN @ Impression:  Hematemesis Acute blood loss anemia Heme positive stool (it should be noted that she takes iron tablets regularly) Epigastric pain Nausea and vomiting. It sounds as if she may have gotten ahold of something that to eat. However, the heme positive stool, known history of ulcer and chronic aspirin use raises suspicion for persistent ulcer.  She is certainly stable this morning, although her hemoglobin is somewhat lower than upon admission. Plan:  EGD this morning. She is agreeable after discussion of the procedure and risks. She is familiar with it, having had it done multiple times in the past.  The benefits and risks of the planned procedure were described in detail with the patient or (when appropriate) their health care proxy.  Risks were outlined as including, but not limited to, bleeding, infection, perforation, adverse medication reaction leading to cardiac or pulmonary decompensation, or pancreatitis (if ERCP).  The limitation of incomplete mucosal visualization was also discussed.   No guarantees or warranties were given.  Continue acid suppression and nothing by mouth status   Thank you for the courtesy of this consult.  Please contact me with any questions or concerns.  [name] Pager: [phone] Mon-Fri 8a-5p [phone] after 5p, weekends, holidays  CC: [facility]ist service Pricilla Holm M.D.

## 2016-10-22 NOTE — Discharge Instructions (Signed)
You were admitted to the hospital for a suspected bleed in your stomach.  You had a endoscopy procedure, that was fortunately normal showed no evidence of bleeding.  You were otherwise doing fairly well and we felt comfortable discharging you at this point.   We recommend that you continue taking Protonix 40 mg daily.  If you develop further episodes of black stools or vomit or bright red blood  and feel lightheaded we would recommend to be seen urgently.

## 2016-10-22 NOTE — Op Note (Signed)
[facility] Patient Name: [name] Procedure Date : 10/22/2016 MRN: [id number] Attending MD: Estill Cotta. [name] , MD Date of Birth: 1933-05-21 CSN: [id number] Age: 82 Admit Type: Inpatient Procedure:                Upper GI endoscopy Indications:              Hematemesis (reported black emesis), Heme positive                            stool but without melena Providers:                Mallie Mussel L. [name] Carrow, MD, Elna Breslow, RN, Ralene Bathe,                            Technician Referring MD:              Medicines:                Midazolam 5 mg IV, Cetacaine spray Complications:            No immediate complications. Estimated Blood Loss:     Estimated blood loss: none. Procedure:                Pre-Anesthesia Assessment:                           - Prior to the procedure, a History and Physical                            was performed, and patient medications and                            allergies were reviewed. The patient's tolerance of                            previous anesthesia was also reviewed. The risks                            and benefits of the procedure and the sedation                            options and risks were discussed with the patient.                            All questions were answered, and informed consent                            was obtained. Prior Anticoagulants: The patient has                            taken aspirin, last dose was 1 day prior to                            procedure. ASA Grade Assessment: III - A patient  with severe systemic disease. After reviewing the                            risks and benefits, the patient was deemed in                            satisfactory condition to undergo the procedure.                           After obtaining informed consent, the endoscope was                            passed under direct vision. Throughout the                            procedure, the  patient's blood pressure, pulse, and                            oxygen saturations were monitored continuously. The                            [id number]) scope was introduced through the                            mouth, and advanced to the second part of duodenum.                            The upper GI endoscopy was accomplished without                            difficulty. The patient tolerated the procedure. Scope In: Scope Out: Findings:      Salmon-colored mucosa was present, consistent with possible Barrett's       esophagus. There were no raised or suspicious lesions in this area.      A large hiatal hernia was present (30-37 cm from incisors).      The cardia and gastric fundus were normal on retroflexion.      A large diverticulum was found in the second portion of the duodenum. Impression:               - Salmon-colored mucosa.                           - Large hiatal hernia.                           - Normal examined duodenum.                           - No specimens collected.                           Nothing seen to account for reported black emesis,                            which does not seem  to have been blood as no                            bleeding source seen on this exam. Moderate Sedation:      Moderate (conscious) sedation was administered by the endoscopy nurse       and supervised by the endoscopist. The following parameters were       monitored: oxygen saturation, heart rate, blood pressure, respiratory       rate, EKG, adequacy of pulmonary ventilation, and response to care.       Total physician intraservice time was 10 minutes. Recommendation:           - Resume regular diet.                           - Patient can be discharged home anytime from a GI                            perspective.                           One daily PPI                           Follow up as needed with Dr Havery Moros in GI clinic.                           -  Medication reconciliation was performed, and a                            list of the patient's discharge medications was                            provided to the patient. Procedure Code(s):        --- Professional ---                           [phone], Esophagogastroduodenoscopy, flexible,                            transoral; diagnostic, including collection of                            specimen(s) by brushing or washing, when performed                            (separate procedure)                           99152, Moderate sedation services provided by the                            same physician or other qualified health care                            professional performing the diagnostic or  therapeutic service that the sedation supports,                            requiring the presence of an independent trained                            observer to assist in the monitoring of the                            patient's level of consciousness and physiological                            status; initial 15 minutes of intraservice time,                            patient age 24 years or older Diagnosis Code(s):        --- Professional ---                           K22.8, Other specified diseases of esophagus                           K44.9, Diaphragmatic hernia without obstruction or                            gangrene                           K92.0, Hematemesis                           R19.5, Other fecal abnormalities CPT copyright 2016 [employer]. All rights reserved. The codes documented in this report are preliminary and upon coder review may  be revised to meet current compliance requirements. [name] L. [name] Carrow, MD 10/22/2016 10:48:22 AM This report has been signed electronically. Number of Addenda: 0

## 2016-10-23 ENCOUNTER — Telehealth: Payer: Self-pay | Admitting: Gastroenterology

## 2016-10-23 LAB — URINE CULTURE

## 2016-10-23 NOTE — Telephone Encounter (Signed)
Patient calling in regarding this.  °

## 2016-10-23 NOTE — Telephone Encounter (Signed)
Spoke to patient discussed her discharge instructions. She understands to call if she has questions or concerns.

## 2016-10-23 NOTE — Discharge Summary (Signed)
[name] Discharge Summary  Patient name: [name] Medical record number: [id number] Date of birth: 05-Dec-1933 Age: 82 y.o. Gender: female Date of Admission: 10/21/2016  Date of Discharge: 10/22/2012 Admitting Physician: Blane Ohara McDiarmid, MD  Primary Care Provider: Reginia Forts, MD Consultants: Gastroenterology  Indication for Hospitalization: Melena, coffee-ground emesis  Discharge Diagnoses/Problem List:  Patient Active Problem List   Diagnosis Date Noted  . Acute blood loss anemia   . Upper GI bleed 10/21/2016  . Rectal bleeding 11/25/2014  . Gastroesophageal reflux disease without esophagitis 07/31/2014  . Loss of weight 07/31/2014  . Gastric ulcer without hemorrhage or perforation 07/22/2013  . Syphilis 02/27/2012  . Dementia   . Spondylosis   . Foot deformity   . Migraine   . CAD (coronary artery disease)   . Acute renal insufficiency   . GERD (gastroesophageal reflux disease)   . Hyperglycemia   . HSV-2 (herpes simplex virus 2) infection   . Abnormal finding on Pap smear, ASCUS   . Osteoporosis   . CONSTIPATION 12/12/2010  . PUD, HX OF 11/10/2010  . Hemorrhage of rectum and anus 02/21/2010  . ANEMIA, IRON DEFICIENCY 06/17/2008  . BARRETTS ESOPHAGUS 06/17/2008  . HYPERLIPIDEMIA 06/16/2008  . GLAUCOMA 06/16/2008  . Essential hypertension 06/16/2008  . DIVERTICULOSIS, COLON 06/16/2008  . HEMOCCULT POSITIVE STOOL 06/16/2008  . ARTHRITIS 06/16/2008  . SLEEP APNEA 06/16/2008    Disposition: Discharge home  Discharge Condition: Stable, improved  Discharge Exam:  General: 82 year old female lying in bed, well-appearing Cardiovascular: Regular rate and rhythm, no murmurs Respiratory: Normal work of breathing, clear to auscultation bilaterally, no wheezing or rhonchi Abdomen: Soft, mildly tender epigastric pain, nondistended Extremities: No cyanosis, clubbing or edema Psych: Normal mood and affect  Brief [name] Course:   Patient presented to [name] emergency department after having some burning in her chest and epigastric region and feeling nausea and vomiting "jet black vomit" at home.  In the ED she was started on IV Protonix, rectal exam for positive melena, positive FOBT and positive guaiac.  Her baseline hemoglobin is between 11.5-12.5 and in the ED was noted to be 10.5.  She also has a history of hypertension, and was found to be hypotensive on admission with a blood pressure of 97/51 which improved with fluid resuscitation. There was also question of acute urinary tract infection and patient had UA suggestive of urinary tract infection and was started on ceftriaxone. Gastroenterology was consulted in the ED, and planned for endoscopy the next morning. She was started on Protonix drip, was given Zofran for nausea, started on IV fluids and remained stable overnight. EGD showed salmon-colored mucosa, consistent with possible Barrett's esophagus. However there is no raised or suspicious lesions in this area. Also noted was a large hiatal hernia. Nothing on exam seem to account for reported black emesis. Patient was recommended to be discharged on 40 mg Protonix daily and to follow up in GI clinic as needed. At the time of discharge patient's vital signs were stable and hemoglobin was within normal limits.  GI signed off and it was appropriate to discharge her home.   Issues for Follow Up:  1. ?melena/coffee ground emesis: EGD did not reveal any source of possible bleeds. Patient will need to continue to monitor. Consider rechecking CBC at some point  Significant Procedures: EGD  Significant Labs and Imaging:   Recent Labs Lab 10/21/16 1359 10/22/16 0510  WBC 6.3 9.7  HGB 10.5* 9.7*  HCT 33.0* 29.8*  PLT  350 223    Recent Labs Lab 10/21/16 1359 10/22/16 0510  NA 143 142  K 4.2 3.2*  CL 106 108  CO2 30 27  GLUCOSE 101* 105*  BUN 18 12  CREATININE 0.95 0.90  CALCIUM 9.1 8.5*  ALKPHOS 41 37*  AST  19 15  ALT 13* 12*  ALBUMIN 3.2* 2.7*   No results found.  Results/Tests Pending at Time of Discharge: None  Discharge Medications:    Medication List    TAKE these medications   aspirin 81 MG tablet Take 81 mg by mouth every morning.   calcium-vitamin D 500-200 MG-UNIT tablet Take 3 tablets by mouth every morning.   conjugated estrogens vaginal cream Commonly known as:  PREMARIN Place 1 Applicatorful vaginally once a week.   cyclobenzaprine 5 MG tablet Commonly known as:  FLEXERIL TAKE 1/2 TO 1 TABLET BY MOUTH  UP TO TWICE DAILY AS NEEDED FOR MUSCLE SPASMS What changed:  See the new instructions.   desonide 0.05 % cream Commonly known as:  DESOWEN Apply 1 application topically 2 (two) times daily as needed (dry skin).   ferrous sulfate 325 (65 FE) MG tablet Take 325 mg by mouth daily with breakfast.   gabapentin 800 MG tablet Commonly known as:  NEURONTIN take 1 tablet by mouth at bedtime What changed:  See the new instructions.   HAIR/SKIN/NAILS PO Take 1 tablet by mouth every morning.   hydrochlorothiazide 12.5 MG capsule Commonly known as:  MICROZIDE take 1 capsule by mouth EVERY TUESDAY AND SATURDAY What changed:  See the new instructions.   hydroxypropyl methylcellulose / hypromellose 2.5 % ophthalmic solution Commonly known as:  ISOPTO TEARS / GONIOVISC Place 1 drop into both eyes 2 (two) times daily as needed for dry eyes.   lactose free nutrition Liqd Take 237 mLs by mouth 2 (two) times daily between meals. What changed:  when to take this  reasons to take this   metoprolol succinate 50 MG 24 hr tablet Commonly known as:  TOPROL-XL take 1 tablet by mouth once daily What changed:  See the new instructions.   mirtazapine 7.5 MG tablet Commonly known as:  REMERON Take 1 tablet (7.5 mg total) by mouth at bedtime.   nystatin cream Commonly known as:  MYCOSTATIN Apply 1 application topically as needed (dry skin). Reported on 03/30/2016    pantoprazole 40 MG tablet Commonly known as:  PROTONIX Take 1 tablet (40 mg total) by mouth daily. Take one tablet daily for 1 month. If doing well decrease to 20mg  daily. What changed:  Another medication with the same name was added. Make sure you understand how and when to take each.   pantoprazole 40 MG tablet Commonly known as:  PROTONIX Take 1 tablet (40 mg total) by mouth daily. What changed:  You were already taking a medication with the same name, and this prescription was added. Make sure you understand how and when to take each.   promethazine 25 MG tablet Commonly known as:  PHENERGAN take 1 tablet by mouth twice a day if needed for nausea and vomiting What changed:  See the new instructions.   traMADol-acetaminophen 37.5-325 MG tablet Commonly known as:  ULTRACET TAKE 1 TABLET BY MOUTH EVERY 12 HOURS AS NEEDED FOR PAIN   verapamil 240 MG CR tablet Commonly known as:  CALAN-SR take 1 tablet by mouth once daily What changed:  See the new instructions.   vitamin C 500 MG tablet Commonly known as:  ASCORBIC ACID  Take 500 mg by mouth daily.       Discharge Instructions: Please refer to Patient Instructions section of EMR for full details.  Patient was counseled important signs and symptoms that should prompt return to medical care, changes in medications, dietary instructions, activity restrictions, and follow up appointments.   Follow-Up Appointments: Follow-up Information    SMITH,KRISTI, MD. Schedule an appointment as soon as possible for a visit today.   Specialty:  Family Medicine Why:  For [name] follow up  Contact information: [place] [id number]           Eloise Levels, MD 10/23/2016, 11:15 PM PGY-1, [place]

## 2016-10-27 DIAGNOSIS — Z1231 Encounter for screening mammogram for malignant neoplasm of breast: Secondary | ICD-10-CM | POA: Diagnosis not present

## 2016-10-27 DIAGNOSIS — Z803 Family history of malignant neoplasm of breast: Secondary | ICD-10-CM | POA: Diagnosis not present

## 2016-10-27 LAB — HM MAMMOGRAPHY

## 2016-10-28 ENCOUNTER — Inpatient Hospital Stay: Payer: Medicare Other

## 2016-11-03 ENCOUNTER — Other Ambulatory Visit: Payer: Self-pay | Admitting: Urgent Care

## 2016-11-04 ENCOUNTER — Ambulatory Visit (INDEPENDENT_AMBULATORY_CARE_PROVIDER_SITE_OTHER): Payer: Medicare Other | Admitting: Family Medicine

## 2016-11-04 VITALS — BP 154/70 | HR 88 | Temp 97.8°F | Resp 16 | Ht 59.0 in | Wt 109.0 lb

## 2016-11-04 DIAGNOSIS — K219 Gastro-esophageal reflux disease without esophagitis: Secondary | ICD-10-CM

## 2016-11-04 DIAGNOSIS — K921 Melena: Secondary | ICD-10-CM | POA: Diagnosis not present

## 2016-11-04 DIAGNOSIS — K920 Hematemesis: Secondary | ICD-10-CM

## 2016-11-04 DIAGNOSIS — K922 Gastrointestinal hemorrhage, unspecified: Secondary | ICD-10-CM | POA: Diagnosis not present

## 2016-11-04 DIAGNOSIS — K449 Diaphragmatic hernia without obstruction or gangrene: Secondary | ICD-10-CM | POA: Diagnosis not present

## 2016-11-04 DIAGNOSIS — D62 Acute posthemorrhagic anemia: Secondary | ICD-10-CM

## 2016-11-04 DIAGNOSIS — Z23 Encounter for immunization: Secondary | ICD-10-CM | POA: Diagnosis not present

## 2016-11-04 LAB — COMPREHENSIVE METABOLIC PANEL
ALBUMIN: 3.5 g/dL — AB (ref 3.6–5.1)
ALT: 9 U/L (ref 6–29)
AST: 13 U/L (ref 10–35)
Alkaline Phosphatase: 52 U/L (ref 33–130)
BUN: 20 mg/dL (ref 7–25)
CHLORIDE: 104 mmol/L (ref 98–110)
CO2: 28 mmol/L (ref 20–31)
CREATININE: 0.96 mg/dL — AB (ref 0.60–0.88)
Calcium: 9.4 mg/dL (ref 8.6–10.4)
Glucose, Bld: 98 mg/dL (ref 65–99)
POTASSIUM: 4.1 mmol/L (ref 3.5–5.3)
SODIUM: 143 mmol/L (ref 135–146)
TOTAL PROTEIN: 6.4 g/dL (ref 6.1–8.1)
Total Bilirubin: 0.2 mg/dL (ref 0.2–1.2)

## 2016-11-04 LAB — POCT CBC
GRANULOCYTE PERCENT: 71.1 % (ref 37–80)
HEMATOCRIT: 31.5 % — AB (ref 37.7–47.9)
HEMOGLOBIN: 10.8 g/dL — AB (ref 12.2–16.2)
LYMPH, POC: 1 (ref 0.6–3.4)
MCH, POC: 28.6 pg (ref 27–31.2)
MCHC: 34.3 g/dL (ref 31.8–35.4)
MCV: 83.3 fL (ref 80–97)
MID (cbc): 0.6 (ref 0–0.9)
MPV: 6.9 fL (ref 0–99.8)
POC GRANULOCYTE: 3.8 (ref 2–6.9)
POC LYMPH PERCENT: 18 %L (ref 10–50)
POC MID %: 10.9 % (ref 0–12)
Platelet Count, POC: 367 10*3/uL (ref 142–424)
RBC: 3.77 M/uL — AB (ref 4.04–5.48)
RDW, POC: 14.4 %
WBC: 5.4 10*3/uL (ref 4.6–10.2)

## 2016-11-04 MED ORDER — PANTOPRAZOLE SODIUM 20 MG PO TBEC: 20.0000 mg | DELAYED_RELEASE_TABLET | Freq: Every day | ORAL | 11 refills | Status: DC

## 2016-11-04 NOTE — Progress Notes (Signed)
Subjective:    Patient ID: [name], female    DOB: 05-02-1933, 82 y.o.   MRN: [id number]  11/04/2016  Follow-up (Weight loss)   HPI This 82 y.o. female presents for [place] for recent admission for melena, coffee ground emesis.  Admitted 10/21/16 through 10/23/16.    Brief Hospital Course:  Patient presented to [facility] emergency department after having some burning in her chest and epigastric region and feeling nausea and vomiting "jet black vomit" at home.  In the ED she was started on IV Protonix, rectal exam for positive melena, positive FOBT and positive guaiac.  Her baseline hemoglobin is between 11.5-12.5 and in the ED was noted to be 10.5.  She also has a history of hypertension, and was found to be hypotensive on admission with a blood pressure of 97/51 which improved with fluid resuscitation. There was also question of acute urinary tract infection and patient had UA suggestive of urinary tract infection and was started on ceftriaxone. Gastroenterology was consulted in the ED, and planned for endoscopy the next morning. She was started on Protonix drip, was given Zofran for nausea, started on IV fluids and remained stable overnight. EGD showed salmon-colored mucosa, consistent with possible Barrett's esophagus. However there is no raised or suspicious lesions in this area. Also noted was a large hiatal hernia. Nothing on exam seem to account for reported black emesis. Patient was recommended to be discharged on 40 mg Protonix daily and to follow up in GI clinic as needed. At the time of discharge patient's vital signs were stable and hemoglobin was within normal limits.  GI signed off and it was appropriate to discharge her home.   Issues for Follow Up:  1. ?melena/coffee ground emesis: EGD did not reveal any source of possible bleeds. Patient will need to continue to monitor. Consider rechecking CBC at some point  Significant Procedures: EGD  Discharge  Medications:    Medication List    TAKE these medications   aspirin 81 MG tablet Take 81 mg by mouth every morning.  calcium-vitamin D 500-200 MG-UNIT tablet Take 3 tablets by mouth every morning.  conjugated estrogens vaginal cream Commonly known as:  PREMARIN Place 1 Applicatorful vaginally once a week.  cyclobenzaprine 5 MG tablet Commonly known as:  FLEXERIL TAKE 1/2 TO 1 TABLET BY MOUTH  UP TO TWICE DAILY AS NEEDED FOR MUSCLE SPASMS What changed:  See the new instructions.  desonide 0.05 % cream Commonly known as:  DESOWEN Apply 1 application topically 2 (two) times daily as needed (dry skin).  ferrous sulfate 325 (65 FE) MG tablet Take 325 mg by mouth daily with breakfast.  gabapentin 800 MG tablet Commonly known as:  NEURONTIN take 1 tablet by mouth at bedtime What changed:  See the new instructions.  HAIR/SKIN/NAILS PO Take 1 tablet by mouth every morning.  hydrochlorothiazide 12.5 MG capsule Commonly known as:  MICROZIDE take 1 capsule by mouth EVERY TUESDAY AND SATURDAY What changed:  See the new instructions.  hydroxypropyl methylcellulose / hypromellose 2.5 % ophthalmic solution Commonly known as:  ISOPTO TEARS / GONIOVISC Place 1 drop into both eyes 2 (two) times daily as needed for dry eyes.  lactose free nutrition Liqd Take 237 mLs by mouth 2 (two) times daily between meals. What changed:  when to take this  reasons to take this  metoprolol succinate 50 MG 24 hr tablet Commonly known as:  TOPROL-XL take 1 tablet by mouth once daily What changed:  See  the new instructions.  mirtazapine 7.5 MG tablet Commonly known as:  REMERON Take 1 tablet (7.5 mg total) by mouth at bedtime.  nystatin cream Commonly known as:  MYCOSTATIN Apply 1 application topically as needed (dry skin). Reported on 03/30/2016  pantoprazole 40 MG tablet Commonly known as:  PROTONIX Take 1 tablet (40 mg total) by mouth daily. Take one tablet daily for 1 month. If doing well decrease  to 20mg  daily. What changed:  Another medication with the same name was added. Make sure you understand how and when to take each.  pantoprazole 40 MG tablet Commonly known as:  PROTONIX Take 1 tablet (40 mg total) by mouth daily. What changed:  You were already taking a medication with the same name, and this prescription was added. Make sure you understand how and when to take each.  promethazine 25 MG tablet Commonly known as:  PHENERGAN take 1 tablet by mouth twice a day if needed for nausea and vomiting What changed:  See the new instructions.  traMADol-acetaminophen 37.5-325 MG tablet Commonly known as:  ULTRACET TAKE 1 TABLET BY MOUTH EVERY 12 HOURS AS NEEDED FOR PAIN  verapamil 240 MG CR tablet Commonly known as:  CALAN-SR take 1 tablet by mouth once daily What changed:  See the new instructions.  vitamin C 500 MG tablet Commonly known as:  ASCORBIC ACID Take 500 mg by mouth daily.      Discharge Instructions: Please refer to Patient Instructions section of EMR for full details.  Patient was counseled important signs and symptoms that should prompt return to medical care, changes in medications, dietary instructions, activity restrictions, and follow up appointments.    BP Readings from Last 3 Encounters:  11/04/16 (!) 154/70  10/22/16 136/67  10/21/16 122/72    Wt Readings from Last 3 Encounters:  11/04/16 109 lb (49.4 kg)  10/21/16 112 lb 6.4 oz (51 kg)  10/11/16 111 lb 9.6 oz (50.6 kg)   Immunization History  Administered Date(s) Administered  . Influenza Split 10/12/2011  . Influenza Whole 09/20/2012  . Influenza,inj,Quad PF,36+ Mos 10/07/2013, 09/03/2014, 08/26/2015, 10/11/2016  . Pneumococcal Conjugate-13 07/21/2014  . Pneumococcal Polysaccharide-23 12/24/2001, 12/24/2006, 11/04/2016  . Tdap 07/15/2007, 04/28/2014    Had been eating a lot of fruit prior to admission; has always struggled with pineapple, tomatoes from a GI standpoint. After eating  pineapple, suffered with nausea. Forced self to vomit by sticking finger in throat; vomited large amount of black emesis.  Presented to ED due to concern of black emesis.  In ED, guaic positive and hemoglobin had dropped from baseline.  Admitted for serial CBCs; underwent EGD that was negative for active bleeding; Hiatal hernia was appreciated.  Discharged with protonix 40mg  daily and has been compliant with medication. Has felt really well since hospital discharge; has been raking leaves this morning. Denies chest pain, palpitations/tachycardia, or DOE/SOB.  Denies further nausea/vomiting/diarrhea/constipation. Denies melena or bloody stools.  Appetite has been fine.  Has avoided high acid fruits since discharge. Has been drinking Boost which helps with regular bowel movements.  Denies dizziness or fatigue.   Review of Systems  Constitutional: Negative for chills, diaphoresis, fatigue and fever.  Eyes: Negative for visual disturbance.  Respiratory: Negative for cough and shortness of breath.   Cardiovascular: Negative for chest pain, palpitations and leg swelling.  Gastrointestinal: Negative for abdominal distention, abdominal pain, anal bleeding, blood in stool, constipation, diarrhea, nausea, rectal pain and vomiting.  Endocrine: Negative for cold intolerance, heat intolerance, polydipsia, polyphagia and  polyuria.  Neurological: Negative for dizziness, tremors, seizures, syncope, facial asymmetry, speech difficulty, weakness, light-headedness, numbness and headaches.    Past Medical History:  Diagnosis Date  . Abnormal finding on Pap smear, ASCUS   . Acute renal insufficiency   . Amputation finger    index  . Barrett's esophagus   . CAD (coronary artery disease)   . Dementia   . Diverticulosis   . Elevated BP   . Foot deformity   . GERD (gastroesophageal reflux disease)   . Glaucoma   . H/O: GI bleed   . HSV-2 (herpes simplex virus 2) infection   . Hyperglycemia   . Hypertension   .  Migraine   . Osteoporosis    worsening  . Spondylosis    Past Surgical History:  Procedure Laterality Date  . AMPUTATION FINGER / THUMB     secondary to osteomyelitis  . COLONOSCOPY    . ESOPHAGOGASTRODUODENOSCOPY N/A 10/22/2016   Procedure: ESOPHAGOGASTRODUODENOSCOPY (EGD);  Surgeon: Doran Stabler, MD;  Location: [facility];  Service: Endoscopy;  Laterality: N/A;  . FOOT SURGERY     right  . PARTIAL HYSTERECTOMY    . UPPER GASTROINTESTINAL ENDOSCOPY     No Known Allergies  Social History   Social History  . Marital status: Single    Spouse name: N/A  . Number of children: 1  . Years of education: N/A   Occupational History  .  Retired   Social History Main Topics  . Smoking status: Never Smoker  . Smokeless tobacco: Never Used  . Alcohol use No  . Drug use: No  . Sexual activity: No   Other Topics Concern  . Not on file   Social History Narrative  . No narrative on file   Family History  Problem Relation Age of Onset  . Breast cancer Mother   . Heart attack Mother   . Heart disease Father   . Alcohol abuse Son   . Colon cancer Neg Hx   . Stomach cancer Neg Hx   . Rectal cancer Neg Hx        Objective:    BP (!) 154/70   Pulse 88   Temp 97.8 F (36.6 C) (Oral)   Resp 16   Ht 4\' 11"  (1.499 m)   Wt 109 lb (49.4 kg)   SpO2 99%   BMI 22.02 kg/m  Physical Exam  Constitutional: She is oriented to person, place, and time. She appears well-developed and well-nourished. No distress.  HENT:  Head: Normocephalic and atraumatic.  Right Ear: External ear normal.  Left Ear: External ear normal.  Nose: Nose normal.  Mouth/Throat: Oropharynx is clear and moist.  Eyes: Conjunctivae and EOM are normal. Pupils are equal, round, and reactive to light.  Neck: Normal range of motion. Neck supple. Carotid bruit is not present. No thyromegaly present.  Cardiovascular: Normal rate, regular rhythm, normal heart sounds and intact distal pulses.  Exam reveals no  gallop and no friction rub.   No murmur heard. Pulmonary/Chest: Effort normal and breath sounds normal. She has no wheezes. She has no rales.  Abdominal: Soft. Bowel sounds are normal. She exhibits no distension and no mass. There is no tenderness. There is no rebound and no guarding.  Lymphadenopathy:    She has no cervical adenopathy.  Neurological: She is alert and oriented to person, place, and time. No cranial nerve deficit.  Skin: Skin is warm and dry. No rash noted. She is not diaphoretic. No  erythema. No pallor.  Psychiatric: She has a normal mood and affect. Her behavior is normal.   Results for orders placed or performed in visit on 11/04/16  POCT CBC  Result Value Ref Range   WBC 5.4 4.6 - 10.2 K/uL   Lymph, poc 1.0 0.6 - 3.4   POC LYMPH PERCENT 18.0 10 - 50 %L   MID (cbc) 0.6 0 - 0.9   POC MID % 10.9 0 - 12 %M   POC Granulocyte 3.8 2 - 6.9   Granulocyte percent 71.1 37 - 80 %G   RBC 3.77 (A) 4.04 - 5.48 M/uL   Hemoglobin 10.8 (A) 12.2 - 16.2 g/dL   HCT, POC 31.5 (A) 37.7 - 47.9 %   MCV 83.3 80 - 97 fL   MCH, POC 28.6 27 - 31.2 pg   MCHC 34.3 31.8 - 35.4 g/dL   RDW, POC 14.4 %   Platelet Count, POC 367 142 - 424 K/uL   MPV 6.9 0 - 99.8 fL       Assessment & Plan:   1. Melena   2. Coffee ground emesis   3. Upper GI bleed   4. Gastroesophageal reflux disease without esophagitis   5. Acute blood loss anemia   6. Hiatal hernia   7. Need for prophylactic vaccination against Streptococcus pneumoniae (pneumococcus)    -[facility] records reviewed in detail during visit; repeat CBC obtained and improved from hospitalization.  -asymptomatic at this time; tolerating Protonix 40mg  daily without side effects. -advised to complete 30 day supply of Protonix 40mg  and then start Protonix 20mg  daily as per GI during admission. -recommend meat, vegetables, and dairy daily.   -s/p Pneumovax. -keep two month follow-up as previously planned.  Orders Placed This Encounter    Procedures  . Pneumococcal polysaccharide vaccine 23-valent greater than or equal to 2yo subcutaneous/IM  . Comprehensive metabolic panel  . POCT CBC   Meds ordered this encounter  Medications  . pantoprazole (PROTONIX) 20 MG tablet    Sig: Take 1 tablet (20 mg total) by mouth daily.    Dispense:  30 tablet    Refill:  11    No Follow-up on file.   Jeniyah Menor Elayne Guerin, M.D. [facility] [address] [place], [place]  [address] [phone] phone [phone] fax

## 2016-11-04 NOTE — Patient Instructions (Addendum)
1. Finish Pantoprazole 40mg  one tablet daily for one month then decrease to 20mg  daily.      IF you received an x-ray today, you will receive an invoice from [facility]. Please contact [facility] at [phone] with questions or concerns regarding your invoice.   IF you received labwork today, you will receive an invoice from [employer]. Please contact [facility] at [phone] with questions or concerns regarding your invoice.   Our billing staff will not be able to assist you with questions regarding bills from these companies.  You will be contacted with the lab results as soon as they are available. The fastest way to get your results is to activate your My Chart account. Instructions are located on the last page of this paperwork. If you have not heard from [place] regarding the results in 2 weeks, please contact this office.

## 2016-11-10 ENCOUNTER — Telehealth: Payer: Self-pay | Admitting: Gastroenterology

## 2016-11-10 NOTE — Telephone Encounter (Signed)
Called patient back, she was questioning about the Protonix 40 mg. I let her know that Dr. Tamala Julian had changed her dose from 40 mg to 20 mg. I told her that it looked like she wanted her to finish the 40 mg then switch to the 20 mg. I asked her to call her office if she had questions.

## 2016-11-22 ENCOUNTER — Ambulatory Visit: Payer: Medicare Other | Admitting: Podiatry

## 2016-11-29 ENCOUNTER — Other Ambulatory Visit: Payer: Self-pay | Admitting: Emergency Medicine

## 2016-11-29 ENCOUNTER — Other Ambulatory Visit: Payer: Self-pay | Admitting: Physician Assistant

## 2016-11-30 NOTE — Telephone Encounter (Signed)
Pt calling wanting e refill op Gabapentin [facility] she states that she only has 1 more pill please respond

## 2016-11-30 NOTE — Telephone Encounter (Signed)
Patient is calling to see if she can get a refill for gabapentin. Patient states the pharmacy only gave her 3 pills and she wants the 87 more.  [facility] on [address]

## 2016-12-02 ENCOUNTER — Other Ambulatory Visit: Payer: Self-pay | Admitting: Family Medicine

## 2016-12-02 MED ORDER — CYCLOBENZAPRINE HCL 5 MG PO TABS: ORAL_TABLET | ORAL | 3 refills | Status: DC

## 2016-12-02 NOTE — Telephone Encounter (Signed)
Gabapentin approved/escribed.

## 2016-12-02 NOTE — Telephone Encounter (Signed)
Last visit 11/04/16 Next visit 01/17/17 with Dr Nicoletta Ba to refill per protocol Have sent message regarding refill of Gabapentin

## 2016-12-02 NOTE — Telephone Encounter (Signed)
You have recently evaluated patient.11/04/16 She previously was a patient of Dr Everlene Farrier, her Rx for Gabapentin is due for refill.  I have reviewed her chart and she has been on it for some time, she has some back pain/ foot pain.  Ok to refill Gabapentin 800mg  at bedtime ? She does have a follow up with you on 01/18/16

## 2016-12-02 NOTE — Telephone Encounter (Signed)
Pt calling about her medication I informed her it was just sent over to the pharamcy per previous note

## 2016-12-02 NOTE — Telephone Encounter (Signed)
Pt calling to request refill of Gabapentin and Flexeril; both sent to [facility].

## 2016-12-12 ENCOUNTER — Other Ambulatory Visit: Payer: Self-pay | Admitting: Emergency Medicine

## 2016-12-14 NOTE — Telephone Encounter (Signed)
10/2016 last ov and lab

## 2016-12-25 HISTORY — PX: CATARACT EXTRACTION, BILATERAL: SHX1313

## 2016-12-28 ENCOUNTER — Ambulatory Visit: Payer: Medicare Other | Admitting: Podiatry

## 2017-01-10 ENCOUNTER — Ambulatory Visit: Payer: Medicare Other | Admitting: Podiatry

## 2017-01-15 DIAGNOSIS — M5136 Other intervertebral disc degeneration, lumbar region: Secondary | ICD-10-CM | POA: Diagnosis not present

## 2017-01-15 DIAGNOSIS — M19042 Primary osteoarthritis, left hand: Secondary | ICD-10-CM | POA: Diagnosis not present

## 2017-01-15 DIAGNOSIS — G894 Chronic pain syndrome: Secondary | ICD-10-CM | POA: Diagnosis not present

## 2017-01-17 ENCOUNTER — Ambulatory Visit: Payer: Medicare Other | Admitting: Family Medicine

## 2017-01-18 DIAGNOSIS — H401111 Primary open-angle glaucoma, right eye, mild stage: Secondary | ICD-10-CM | POA: Diagnosis not present

## 2017-01-18 DIAGNOSIS — H401122 Primary open-angle glaucoma, left eye, moderate stage: Secondary | ICD-10-CM | POA: Diagnosis not present

## 2017-01-18 DIAGNOSIS — H25813 Combined forms of age-related cataract, bilateral: Secondary | ICD-10-CM | POA: Diagnosis not present

## 2017-01-22 ENCOUNTER — Ambulatory Visit: Payer: Medicare Other | Admitting: Podiatry

## 2017-01-23 ENCOUNTER — Ambulatory Visit: Payer: Medicare Other | Admitting: Family Medicine

## 2017-01-31 ENCOUNTER — Other Ambulatory Visit: Payer: Self-pay | Admitting: Family Medicine

## 2017-02-12 DIAGNOSIS — H25812 Combined forms of age-related cataract, left eye: Secondary | ICD-10-CM | POA: Diagnosis not present

## 2017-02-12 DIAGNOSIS — H409 Unspecified glaucoma: Secondary | ICD-10-CM | POA: Diagnosis not present

## 2017-02-12 DIAGNOSIS — H25012 Cortical age-related cataract, left eye: Secondary | ICD-10-CM | POA: Diagnosis not present

## 2017-02-12 DIAGNOSIS — H2512 Age-related nuclear cataract, left eye: Secondary | ICD-10-CM | POA: Diagnosis not present

## 2017-02-12 DIAGNOSIS — H25042 Posterior subcapsular polar age-related cataract, left eye: Secondary | ICD-10-CM | POA: Diagnosis not present

## 2017-02-21 ENCOUNTER — Ambulatory Visit: Payer: Medicare Other | Admitting: Family Medicine

## 2017-02-28 DIAGNOSIS — H2511 Age-related nuclear cataract, right eye: Secondary | ICD-10-CM | POA: Diagnosis not present

## 2017-03-14 ENCOUNTER — Encounter: Payer: Self-pay | Admitting: Family Medicine

## 2017-03-19 DIAGNOSIS — H25811 Combined forms of age-related cataract, right eye: Secondary | ICD-10-CM | POA: Diagnosis not present

## 2017-03-19 DIAGNOSIS — H2511 Age-related nuclear cataract, right eye: Secondary | ICD-10-CM | POA: Diagnosis not present

## 2017-03-19 DIAGNOSIS — H25041 Posterior subcapsular polar age-related cataract, right eye: Secondary | ICD-10-CM | POA: Diagnosis not present

## 2017-03-19 DIAGNOSIS — H25011 Cortical age-related cataract, right eye: Secondary | ICD-10-CM | POA: Diagnosis not present

## 2017-03-29 ENCOUNTER — Other Ambulatory Visit: Payer: Self-pay

## 2017-03-29 ENCOUNTER — Telehealth: Payer: Self-pay | Admitting: Gastroenterology

## 2017-03-29 MED ORDER — PANTOPRAZOLE SODIUM 40 MG PO TBEC: 40.0000 mg | DELAYED_RELEASE_TABLET | Freq: Every day | ORAL | 1 refills | Status: DC

## 2017-03-29 NOTE — Telephone Encounter (Signed)
Pt c/o bad heartburn, vomiting "black stuff". She denies abdominal pain. She has an appt scheduled for May. She is currently taking 20mg  qd of Pantoprazole. She believes that when she took 40mg  daily that it helped.

## 2017-03-29 NOTE — Telephone Encounter (Signed)
Patient reports that she ate greens last night, then this morning had some heartburn and she made herself vomit. This is when she saw "black stuff". She had only one episode of this, denies any SOB, no red blood in vomit or stool, no fatigue. She felt better, was able to eat some lunch and has had no problems since. She has numerous bottles of pantroprazole, some old. Her 40 mg bottle being one of the old ones. Advised her to throw that out. She is requesting a new Rx be called in for this. Only thing she is complaining about being wrong is the reflux. I have advised her to try 40 mg of pantoprazole daily, she has 20 mg bottle of this as well, instructed to take two tablets until new Rx is picked up. She has follow up visit with you on 5/11. There are no APP appointments until 4/11.

## 2017-03-29 NOTE — Telephone Encounter (Signed)
Okay that sounds fine. Seems unlikely she is bleeding, perhaps vomited greens she ate the night prior. If symptoms recur or persist, or blood in the stools, she needs to contact us immediately. Otherwise she can continue protonix 40mg  daily. Thanks for checking on her.

## 2017-03-29 NOTE — Telephone Encounter (Signed)
Almyra Free can you please obtain a bit more information from this patient? She was previously admitted in October and Dr. Loletha Carrow perform an EGD for her vomiting dark material and it was normal. Unclear if she ingested something dark but need to make sure she is not actively bleeding. If she is having symptoms concerning for bleeding, such as vomiting blood or blood in her stools please let me know, she may need to go to the ER. If we are not sure we can obtain a CBC but at this hour would not be able to be done until tomorrow unless she goes to the ER. She can take pantoprazole 40mg  daily. If she needs to be seen in the office to help sort this out maybe one of the APPs has an appointment tomorrow. thanks

## 2017-03-31 ENCOUNTER — Other Ambulatory Visit: Payer: Self-pay | Admitting: Gastroenterology

## 2017-04-02 ENCOUNTER — Ambulatory Visit: Payer: Medicare Other | Admitting: Gastroenterology

## 2017-04-02 ENCOUNTER — Other Ambulatory Visit: Payer: Self-pay

## 2017-04-02 MED ORDER — PROMETHAZINE HCL 12.5 MG PO TABS: 12.5000 mg | ORAL_TABLET | Freq: Four times a day (QID) | ORAL | 0 refills | Status: DC | PRN

## 2017-04-02 NOTE — Telephone Encounter (Signed)
You just refilled this pts protonix last Thur. Is it ok to send in phenergan as well?

## 2017-04-02 NOTE — Telephone Encounter (Signed)
Please dose her at 12.5mg  tablet and give her #30 with a RF. She can have a routine follow up with me as well, I have not seen her in a while. Thanks

## 2017-04-28 ENCOUNTER — Other Ambulatory Visit: Payer: Self-pay | Admitting: Family Medicine

## 2017-05-01 DIAGNOSIS — Z124 Encounter for screening for malignant neoplasm of cervix: Secondary | ICD-10-CM | POA: Diagnosis not present

## 2017-05-01 DIAGNOSIS — N952 Postmenopausal atrophic vaginitis: Secondary | ICD-10-CM | POA: Diagnosis not present

## 2017-05-01 DIAGNOSIS — L292 Pruritus vulvae: Secondary | ICD-10-CM | POA: Diagnosis not present

## 2017-05-03 ENCOUNTER — Ambulatory Visit: Payer: Medicare Other | Admitting: Gastroenterology

## 2017-05-03 ENCOUNTER — Encounter: Payer: Self-pay | Admitting: Gastroenterology

## 2017-05-03 ENCOUNTER — Ambulatory Visit (INDEPENDENT_AMBULATORY_CARE_PROVIDER_SITE_OTHER): Payer: Medicare Other | Admitting: Gastroenterology

## 2017-05-03 VITALS — BP 124/58 | HR 79 | Ht 59.0 in | Wt 113.0 lb

## 2017-05-03 DIAGNOSIS — R1013 Epigastric pain: Secondary | ICD-10-CM

## 2017-05-03 DIAGNOSIS — K2270 Barrett's esophagus without dysplasia: Secondary | ICD-10-CM | POA: Diagnosis not present

## 2017-05-03 DIAGNOSIS — R630 Anorexia: Secondary | ICD-10-CM

## 2017-05-03 DIAGNOSIS — K219 Gastro-esophageal reflux disease without esophagitis: Secondary | ICD-10-CM

## 2017-05-03 MED ORDER — MIRTAZAPINE 15 MG PO TABS: 15.0000 mg | ORAL_TABLET | Freq: Every day | ORAL | 3 refills | Status: DC

## 2017-05-03 NOTE — Patient Instructions (Signed)
If you are age 81 or older, your body mass index should be between 23-30. Your Body mass index is 22.82 kg/m. If this is out of the aforementioned range listed, please consider follow up with your Primary Care Provider.  If you are age 74 or younger, your body mass index should be between 19-25. Your Body mass index is 22.82 kg/m. If this is out of the aformentioned range listed, please consider follow up with your Primary Care Provider.   We have sent the following medications to your pharmacy for you to pick up at your convenience:  Remeron- take 15mg  at bedtime  You have been given samples of FDgard with coupons.  Thank you.

## 2017-05-03 NOTE — Progress Notes (Signed)
HPI :  83 year old female here for follow-up visit for chronic dyspepsia and poor appetite. She weighs 113 pounds today which is 6 pounds heavier than she was one year ago. She reports she been doing well in this regard.   Since the last time I have seen her she had an endoscopy in January 2017 showing a large hiatal hernia, short segment Barrett's esophagus, with possible Lysbeth Galas lesion. She had biopsies of the stomach which rule out H pylori. She has been on Protonix with fairly good control of her heartburn. She was admitted to the hospital October 2017 following emesis of dark material, it was not clear if this are present of blood or not, she had an upper endoscopy by Dr. Loletha Carrow which essentially showed no pathology to cause bleeding. She's not had any further occurrences of emesis.   She continues to have dyspepsia. She reports ongoing poor appetite. She had been on Remeron 7.5mg  for a few months, she is not sure if it helped much at that dosing. Protonix 40mg  dosing once daily has been controlling heartburn. If she avoids trigger foods things seem fairly well controlled. She denies any vomiting, but has some periodic nausea, she is not sure if due to anxiety. She endorses anxiety, stress about "the worlds affairs". She has some postprandial discomfort, it comes and goes. She reports symptoms ongoing for a long time.   EGD 01/17/2016 - 1cm segment of Barrett's esophagus, no dysplasia, 7cm hiatal hernia, superficial proximal gastric ulceration, ? Lysbeth Galas lesion, normal remainder of examined stomach and duodenum - biopsies taken to rule out H pylori -  EGD 10/22/2016 - done to evaluate dark emesis, large hiatal hernia, otherwise short segment BE without any pathology noted to cause bleeding  Colonoscopy 2009 - diverticulosis CT abdomen 2015 - large hiatal hernia, no mass lesions   Past Medical History:  Diagnosis Date  . Abnormal finding on Pap smear, ASCUS   . Acute renal insufficiency   .  Amputation finger    index  . Barrett's esophagus   . CAD (coronary artery disease)   . Dementia   . Diverticulosis   . Elevated BP   . Foot deformity   . GERD (gastroesophageal reflux disease)   . Glaucoma   . H/O: GI bleed   . HSV-2 (herpes simplex virus 2) infection   . Hyperglycemia   . Hypertension   . Migraine   . Osteoporosis    worsening  . Spondylosis      Past Surgical History:  Procedure Laterality Date  . AMPUTATION FINGER / THUMB     secondary to osteomyelitis  . COLONOSCOPY    . ESOPHAGOGASTRODUODENOSCOPY N/A 10/22/2016   Procedure: ESOPHAGOGASTRODUODENOSCOPY (EGD);  Surgeon: Doran Stabler, MD;  Location: [facility] ENDOSCOPY;  Service: Endoscopy;  Laterality: N/A;  . FOOT SURGERY     right  . PARTIAL HYSTERECTOMY    . UPPER GASTROINTESTINAL ENDOSCOPY     Family History  Problem Relation Age of Onset  . Breast cancer Mother   . Heart attack Mother   . Heart disease Father   . Alcohol abuse Son   . Colon cancer Neg Hx   . Stomach cancer Neg Hx   . Rectal cancer Neg Hx    Social History  Substance Use Topics  . Smoking status: Never Smoker  . Smokeless tobacco: Never Used  . Alcohol use No   Current Outpatient Prescriptions  Medication Sig Dispense Refill  . aspirin 81 MG tablet Take 81  mg by mouth every morning.     . Calcium Carbonate-Vitamin D (CALCIUM-VITAMIN D) 500-200 MG-UNIT tablet Take 3 tablets by mouth every morning.    . conjugated estrogens (PREMARIN) vaginal cream Place 1 Applicatorful vaginally once a week.     . cyclobenzaprine (FLEXERIL) 5 MG tablet TAKE 1/2 TO 1 TABLET BY MOUTH  UP TO TWICE DAILY AS NEEDED FOR MUSCLE SPASMS 20 tablet 3  . desonide (DESOWEN) 0.05 % cream Apply 1 application topically 2 (two) times daily as needed (dry skin).     . ferrous sulfate 325 (65 FE) MG tablet Take 325 mg by mouth daily with breakfast.      . gabapentin (NEURONTIN) 800 MG tablet take 1 tablet by mouth at bedtime 90 tablet 1  .  hydrochlorothiazide (MICROZIDE) 12.5 MG capsule take 1 capsule by mouth EVERY TUESDAY AND SATURDAY 24 capsule 1  . hydrocortisone 2.5 % ointment   0  . hydroxypropyl methylcellulose (ISOPTO TEARS) 2.5 % ophthalmic solution Place 1 drop into both eyes 2 (two) times daily as needed for dry eyes.     Marland Kitchen lactose free nutrition (BOOST) LIQD Take 237 mLs by mouth 2 (two) times daily between meals. (Patient taking differently: Take 1 Container by mouth 2 (two) times daily as needed (nutritional). ) 60 Can 11  . metoprolol succinate (TOPROL-XL) 50 MG 24 hr tablet take 1 tablet by mouth once daily 90 tablet 1  . mirtazapine (REMERON) 7.5 MG tablet take 1 tablet by mouth at bedtime 30 tablet 2  . Multiple Vitamins-Minerals (HAIR/SKIN/NAILS PO) Take 1 tablet by mouth every morning.     . nystatin (MYCOSTATIN) cream Apply 1 application topically as needed (dry skin). Reported on 03/30/2016    . pantoprazole (PROTONIX) 40 MG tablet Take 1 tablet (40 mg total) by mouth daily. 30 tablet 1  . promethazine (PHENERGAN) 12.5 MG tablet Take 1 tablet (12.5 mg total) by mouth every 6 (six) hours as needed for nausea or vomiting. 30 tablet 0  . promethazine (PHENERGAN) 25 MG tablet take 1 tablet by mouth twice a day if needed for nausea and vomiting (Patient taking differently: Take 25 mg by mouth twice a day if needed for nausea and vomiting) 30 tablet 0  . traMADol-acetaminophen (ULTRACET) 37.5-325 MG tablet TAKE 1 TABLET BY MOUTH EVERY 12 HOURS AS NEEDED FOR PAIN 60 tablet 2  . verapamil (CALAN-SR) 240 MG CR tablet take 1 tablet by mouth once daily 30 tablet 0  . vitamin C (ASCORBIC ACID) 500 MG tablet Take 500 mg by mouth daily.     No current facility-administered medications for this visit.    No Known Allergies   Review of Systems: All systems reviewed and negative except where noted in HPI.   Lab Results  Component Value Date   WBC 5.4 11/04/2016   HGB 10.8 (A) 11/04/2016   HCT 31.5 (A) 11/04/2016   MCV  83.3 11/04/2016   PLT 223 10/22/2016    Lab Results  Component Value Date   CREATININE 0.96 (H) 11/04/2016   BUN 20 11/04/2016   NA 143 11/04/2016   K 4.1 11/04/2016   CL 104 11/04/2016   CO2 28 11/04/2016   Lab Results  Component Value Date   ALT 9 11/04/2016   AST 13 11/04/2016   ALKPHOS 52 11/04/2016   BILITOT 0.2 11/04/2016     Physical Exam: BP (!) 124/58   Pulse 79   Ht 4\' 11"  (1.499 m)   Wt 113 lb (  51.3 kg)   BMI 22.82 kg/m  Constitutional: Pleasant, thin female in no acute distress. HEENT: Normocephalic and atraumatic. Conjunctivae are normal. No scleral icterus. Neck supple.  Cardiovascular: Normal rate, regular rhythm.  Pulmonary/chest: Effort normal and breath sounds normal. No wheezing, rales or rhonchi. Abdominal: Soft, nondistended, nontender.  There are no masses palpable. No hepatomegaly. Extremities: no edema Lymphadenopathy: No cervical adenopathy noted. Neurological: Alert and oriented to person place and time. Skin: Skin is warm and dry. No rashes noted. Psychiatric: Normal mood and affect. Behavior is normal.   ASSESSMENT AND PLAN: 83 year old female with history of reflux and chronic dyspepsia/poor appetite, here for follow-up.  Reflux is well controlled on Protonix 40 mg once daily, below this dose she has significant reflux and wishes to continue this dose acknowledging potential risks of PPIs. I think is reasonable, she has a very large hiatal hernia with a history of Barrett's.   Otherwise she's had a prior workup with CT scan and endoscopy for chronic dyspepsia and loss of appetite. I think her symptoms are likely functional. She was started on Remeron which is a good choice for her, I will increase her dose to 15 mg daily at bedtime to see if this helps. We'll also add FD Donald Prose to use as needed. She can follow-up as needed for this issue, weight is stable and actually improved from previous. If she continues to have significant symptoms we may  transition her from Remeron to buspirone given a component of anxiety.   Otherwise regarding her Barrett's esophagus I don't feel strongly that she warrants further surveillance given her age we can reassess her for this in a few years. I don't think she warrants any further colon cancer screening.  Callender Lake Cellar, MD [facility] Pager [phone]

## 2017-05-07 ENCOUNTER — Telehealth: Payer: Self-pay | Admitting: Gastroenterology

## 2017-05-07 NOTE — Telephone Encounter (Signed)
Spoke to patient, went over instructions with her on taking the FDguard as needed. She wanted to know about taking all her night time medications together. I have asked her to contact her pharmacy to discuss this issue.

## 2017-05-08 DIAGNOSIS — M5136 Other intervertebral disc degeneration, lumbar region: Secondary | ICD-10-CM | POA: Diagnosis not present

## 2017-05-08 DIAGNOSIS — G894 Chronic pain syndrome: Secondary | ICD-10-CM | POA: Diagnosis not present

## 2017-05-08 IMAGING — CR DG LUMBAR SPINE COMPLETE 4+V
5 series · 5 of 5 positions shown · non-contrast
Comparison: CT Abdomen and Pelvis 02/26/2014.

CLINICAL DATA: 82-year-old female with lumbar back pain radiating
to the hips. No known injury. Initial encounter.

EXAM:
LUMBAR SPINE - COMPLETE 4+ VIEW

[t lumbar spine obl (1 of 2)]
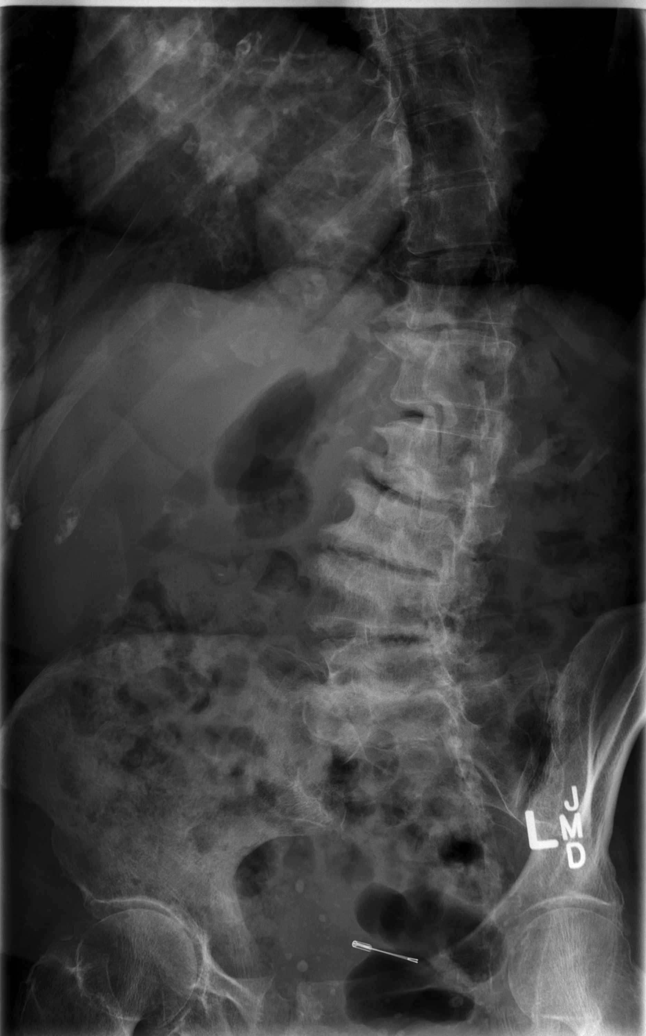

[t lumbar spine ap]
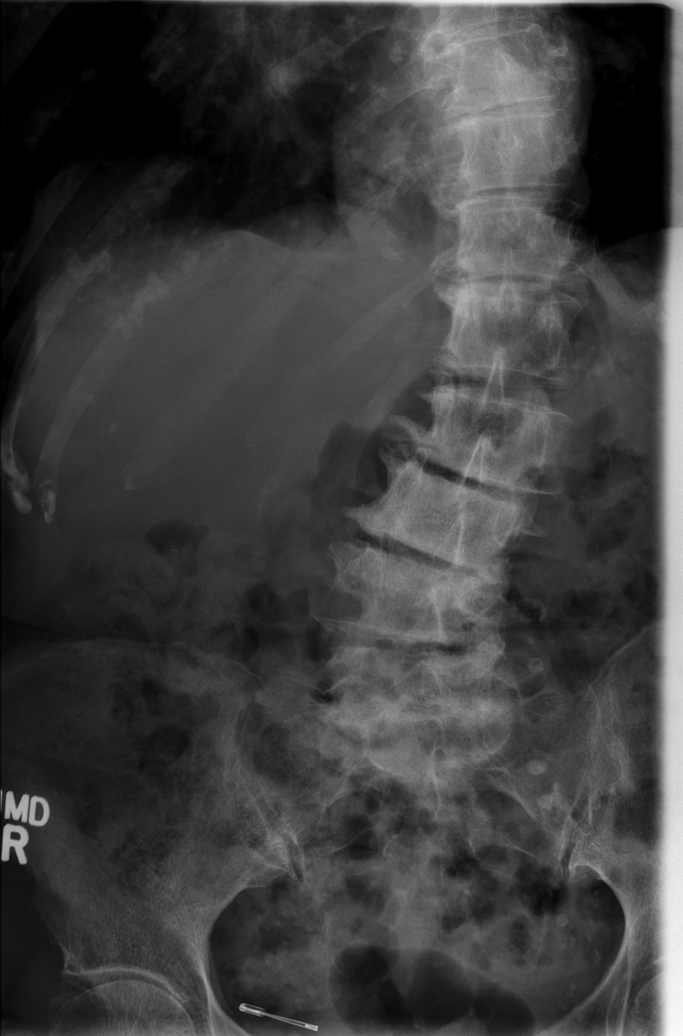

[t lumbar spine obl (2 of 2)]
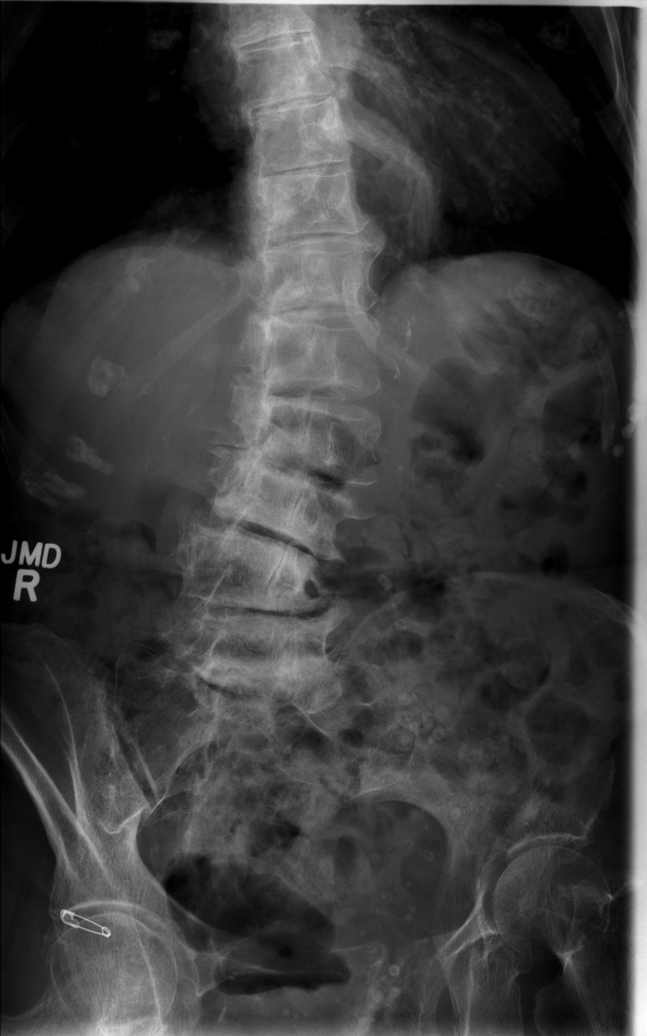

[t lumbar spine lat]
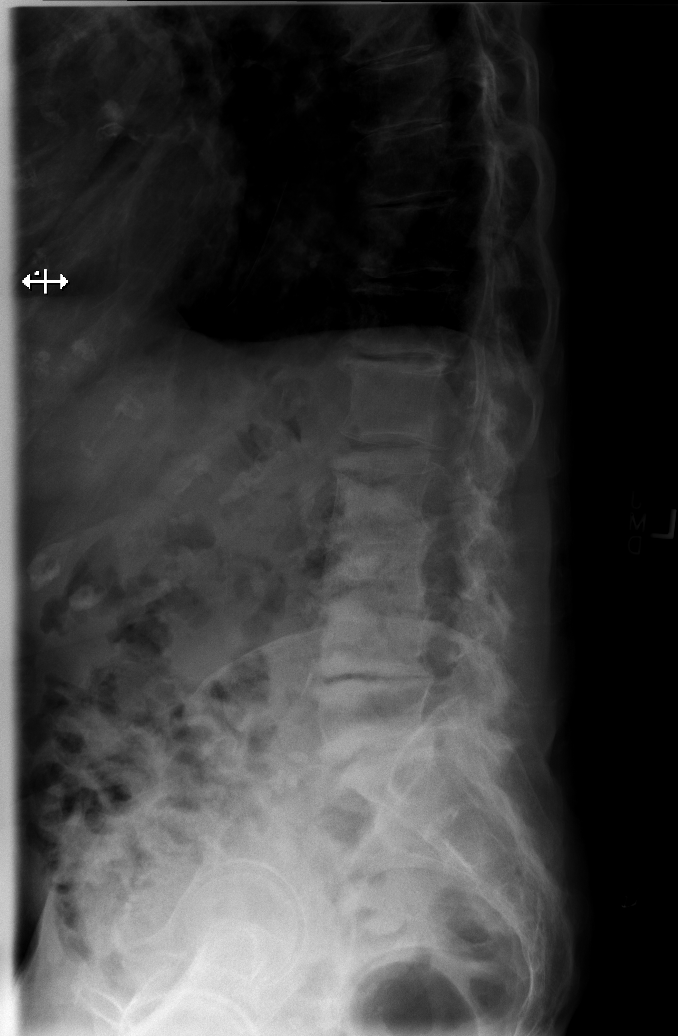

[t lumbar l-5 s-1 spot]
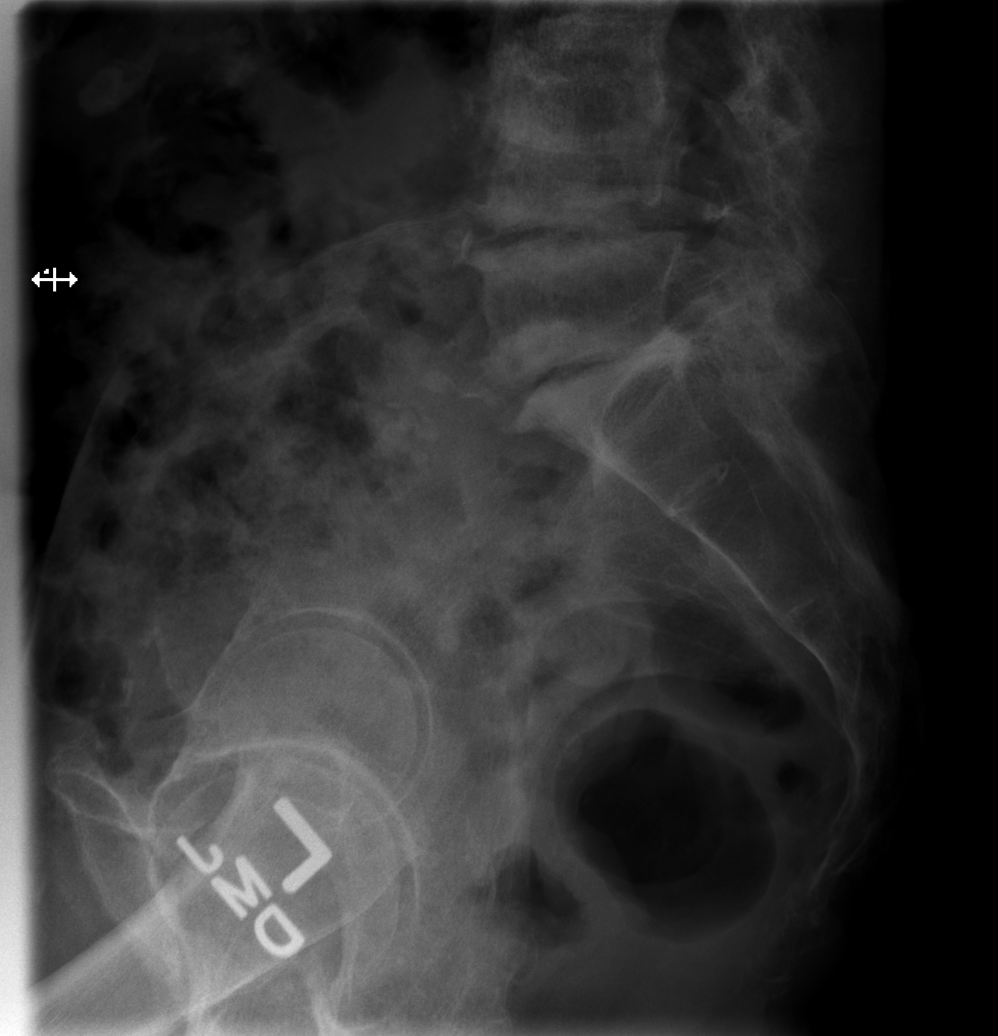

[5 of 5 positions shown; findings below may reference images not displayed]

FINDINGS: Moderate thoracolumbar scoliosis. Lumbar segmentation appears
normal. On the lateral view lumbar vertebral height and alignment is
stable since 9133, with straightening of lumbar lordosis. There is
chronic severe lumbar disc degeneration with vacuum disc throughout.
There is severe endplate and vertebral body sclerosis and widespread
osteophytosis. The L1-L2 level is relatively spared, but the T12-L1
level is similarly affected. No pars fracture identified. Visible
lower thoracic levels appear grossly intact. Sacral ala and SI
joints appear stable. Osteopenia about the pelvis. External safety
pin artifact.
IMPRESSION: 1.  No acute osseous abnormality identified in the lumbar spine.
2. Chronic severe lumbar and lower thoracic disc and endplate
degeneration superimposed on moderate thoracolumbar scoliosis.

## 2017-05-16 ENCOUNTER — Encounter (HOSPITAL_COMMUNITY): Payer: Self-pay | Admitting: Emergency Medicine

## 2017-05-16 ENCOUNTER — Ambulatory Visit (HOSPITAL_COMMUNITY)
Admission: EM | Admit: 2017-05-16 | Discharge: 2017-05-16 | Disposition: A | Discharge status: Home / Self Care | Payer: Medicare Other | Attending: Family Medicine | Admitting: Family Medicine

## 2017-05-16 DIAGNOSIS — R21 Rash and other nonspecific skin eruption: Secondary | ICD-10-CM | POA: Diagnosis not present

## 2017-05-16 MED ORDER — CETIRIZINE HCL 10 MG PO TABS: 10.0000 mg | ORAL_TABLET | Freq: Every day | ORAL | 0 refills | Status: DC

## 2017-05-16 NOTE — ED Provider Notes (Signed)
[place]    CSN: [id number] Arrival date & time: 05/16/17  1129     History   Chief Complaint Chief Complaint  Patient presents with  . Rash    HPI [name] is a 83 y.o. female.   The patient presented to the [facility] with a rash on her legs that she believed to be bed bugs.      Past Medical History:  Diagnosis Date  . Abnormal finding on Pap smear, ASCUS   . Acute renal insufficiency   . Amputation finger    index  . Barrett's esophagus   . CAD (coronary artery disease)   . Dementia   . Diverticulosis   . Elevated BP   . Foot deformity   . GERD (gastroesophageal reflux disease)   . Glaucoma   . H/O: GI bleed   . HSV-2 (herpes simplex virus 2) infection   . Hyperglycemia   . Hypertension   . Migraine   . Osteoporosis    worsening  . Spondylosis     Patient Active Problem List   Diagnosis Date Noted  . Hiatal hernia 11/04/2016  . Acute blood loss anemia   . Upper GI bleed 10/21/2016  . Rectal bleeding 11/25/2014  . Gastroesophageal reflux disease without esophagitis 07/31/2014  . Loss of weight 07/31/2014  . Gastric ulcer without hemorrhage or perforation 07/22/2013  . Syphilis 02/27/2012  . Dementia   . Spondylosis   . Foot deformity   . Migraine   . CAD (coronary artery disease)   . Acute renal insufficiency   . GERD (gastroesophageal reflux disease)   . Hyperglycemia   . HSV-2 (herpes simplex virus 2) infection   . Abnormal finding on Pap smear, ASCUS   . Osteoporosis   . CONSTIPATION 12/12/2010  . PUD, HX OF 11/10/2010  . Hemorrhage of rectum and anus 02/21/2010  . ANEMIA, IRON DEFICIENCY 06/17/2008  . BARRETTS ESOPHAGUS 06/17/2008  . HYPERLIPIDEMIA 06/16/2008  . GLAUCOMA 06/16/2008  . Essential hypertension 06/16/2008  . DIVERTICULOSIS, COLON 06/16/2008  . HEMOCCULT POSITIVE STOOL 06/16/2008  . ARTHRITIS 06/16/2008  . SLEEP APNEA 06/16/2008    Past Surgical History:  Procedure Laterality Date  . AMPUTATION FINGER /  THUMB     secondary to osteomyelitis  . COLONOSCOPY    . ESOPHAGOGASTRODUODENOSCOPY N/A 10/22/2016   Procedure: ESOPHAGOGASTRODUODENOSCOPY (EGD);  Surgeon: Doran Stabler, MD;  Location: [facility] ENDOSCOPY;  Service: Endoscopy;  Laterality: N/A;  . FOOT SURGERY     right  . PARTIAL HYSTERECTOMY    . UPPER GASTROINTESTINAL ENDOSCOPY      OB History    No data available       Home Medications    Prior to Admission medications   Medication Sig Start Date End Date Taking? Authorizing Provider  aspirin 81 MG tablet Take 81 mg by mouth every morning.     [provider]  Calcium Carbonate-Vitamin D (CALCIUM-VITAMIN D) 500-200 MG-UNIT tablet Take 3 tablets by mouth every morning.    [provider]  cetirizine (ZYRTEC HIVES RELIEF) 10 MG tablet Take 1 tablet (10 mg total) by mouth daily. 05/16/17   Robyn Haber, MD  conjugated estrogens (PREMARIN) vaginal cream Place 1 Applicatorful vaginally once a week.     [provider]  cyclobenzaprine (FLEXERIL) 5 MG tablet TAKE 1/2 TO 1 TABLET BY MOUTH  UP TO TWICE DAILY AS NEEDED FOR MUSCLE SPASMS 12/02/16   Wardell Honour, MD  desonide (DESOWEN) 0.05 % cream  Apply 1 application topically 2 (two) times daily as needed (dry skin).     [provider]  ferrous sulfate 325 (65 FE) MG tablet Take 325 mg by mouth daily with breakfast.      [provider]  gabapentin (NEURONTIN) 800 MG tablet take 1 tablet by mouth at bedtime 12/02/16   Wardell Honour, MD  hydrochlorothiazide (MICROZIDE) 12.5 MG capsule take 1 capsule by mouth EVERY TUESDAY AND SATURDAY 12/14/16   Wardell Honour, MD  hydrocortisone 2.5 % ointment  10/03/16   [provider]  hydroxypropyl methylcellulose (ISOPTO TEARS) 2.5 % ophthalmic solution Place 1 drop into both eyes 2 (two) times daily as needed for dry eyes.     [provider]  lactose free nutrition (BOOST) LIQD Take 237 mLs by mouth 2 (two) times daily between  meals. Patient taking differently: Take 1 Container by mouth 2 (two) times daily as needed (nutritional).  03/22/16   [name], MD  metoprolol succinate (TOPROL-XL) 50 MG 24 hr tablet take 1 tablet by mouth once daily 12/02/16   Harrison Mons, PA-C  mirtazapine (REMERON) 15 MG tablet Take 1 tablet (15 mg total) by mouth at bedtime. 05/03/17   Armbruster, Renelda Loma, MD  mirtazapine (REMERON) 7.5 MG tablet take 1 tablet by mouth at bedtime 02/01/17   Tereasa Coop, PA-C  Multiple Vitamins-Minerals (HAIR/SKIN/NAILS PO) Take 1 tablet by mouth every morning.     [provider]  nystatin (MYCOSTATIN) cream Apply 1 application topically as needed (dry skin). Reported on 03/30/2016    [provider]  pantoprazole (PROTONIX) 40 MG tablet Take 1 tablet (40 mg total) by mouth daily. 03/29/17   Armbruster, Renelda Loma, MD  promethazine (PHENERGAN) 12.5 MG tablet Take 1 tablet (12.5 mg total) by mouth every 6 (six) hours as needed for nausea or vomiting. 04/02/17   Armbruster, Renelda Loma, MD  promethazine (PHENERGAN) 25 MG tablet take 1 tablet by mouth twice a day if needed for nausea and vomiting Patient taking differently: Take 25 mg by mouth twice a day if needed for nausea and vomiting 07/25/16   Armbruster, Renelda Loma, MD  traMADol-acetaminophen (ULTRACET) 37.5-325 MG tablet TAKE 1 TABLET BY MOUTH EVERY 12 HOURS AS NEEDED FOR PAIN 05/25/16   [name], MD  verapamil (CALAN-SR) 240 MG CR tablet take 1 tablet by mouth once daily 04/28/17   Wardell Honour, MD  vitamin C (ASCORBIC ACID) 500 MG tablet Take 500 mg by mouth daily.    [provider]    Family History Family History  Problem Relation Age of Onset  . Breast cancer Mother   . Heart attack Mother   . Heart disease Father   . Alcohol abuse Son   . Colon cancer Neg Hx   . Stomach cancer Neg Hx   . Rectal cancer Neg Hx     Social History Social History  Substance Use Topics  . Smoking status: Never Smoker    . Smokeless tobacco: Never Used  . Alcohol use No     Allergies   Patient has no known allergies.   Review of Systems Review of Systems  Skin: Positive for rash.  All other systems reviewed and are negative.    Physical Exam Triage Vital Signs ED Triage Vitals  Enc Vitals Group     BP 05/16/17 1205 (!) 155/64     Pulse Rate 05/16/17 1205 94     Resp 05/16/17 1205 18  Temp 05/16/17 1205 98.5 F (36.9 C)     Temp Source 05/16/17 1205 Oral     SpO2 05/16/17 1205 99 %     Weight --      Height --      Head Circumference --      Peak Flow --      Pain Score 05/16/17 1204 0     Pain Loc --      Pain Edu? --      Excl. in [place]? --    No data found.   Updated Vital Signs BP (!) 155/64 (BP Location: Right Arm)   Pulse 94   Temp 98.5 F (36.9 C) (Oral)   Resp 18   SpO2 99%    Physical Exam  Constitutional: She appears well-developed and well-nourished.  Skin:  Several 1-2 mm red macules on right proximal thigh.  Nursing note and vitals reviewed.    UC Treatments / Results  Labs (all labs ordered are listed, but only abnormal results are displayed) Labs Reviewed - No data to display  EKG  EKG Interpretation None       Radiology No results found.  Procedures Procedures (including critical care time)  Medications Ordered in UC Medications - No data to display   Initial Impression / Assessment and Plan / UC Course  I have reviewed the triage vital signs and the nursing notes.  Pertinent labs & imaging results that were available during my care of the patient were reviewed by me and considered in my medical decision making (see chart for details).     Final Clinical Impressions(s) / UC Diagnoses   Final diagnoses:  Rash    New Prescriptions New Prescriptions   CETIRIZINE (ZYRTEC HIVES RELIEF) 10 MG TABLET    Take 1 tablet (10 mg total) by mouth daily.     Robyn Haber, MD 05/16/17 1229

## 2017-05-16 NOTE — Discharge Instructions (Signed)
These are simply insect bites. They are not bedbug bites.

## 2017-05-16 NOTE — ED Triage Notes (Signed)
The patient presented to the [facility] with a rash on her legs that she believed to be bed bugs.

## 2017-05-24 ENCOUNTER — Other Ambulatory Visit: Payer: Self-pay | Admitting: Family Medicine

## 2017-05-27 ENCOUNTER — Other Ambulatory Visit: Payer: Self-pay | Admitting: Physician Assistant

## 2017-05-30 ENCOUNTER — Ambulatory Visit (INDEPENDENT_AMBULATORY_CARE_PROVIDER_SITE_OTHER): Payer: Medicare Other | Admitting: Family Medicine

## 2017-05-30 ENCOUNTER — Encounter: Payer: Self-pay | Admitting: Family Medicine

## 2017-05-30 VITALS — BP 122/60 | HR 89 | Temp 97.0°F | Resp 18 | Ht 58.27 in | Wt 109.0 lb

## 2017-05-30 DIAGNOSIS — R739 Hyperglycemia, unspecified: Secondary | ICD-10-CM | POA: Diagnosis not present

## 2017-05-30 DIAGNOSIS — I10 Essential (primary) hypertension: Secondary | ICD-10-CM

## 2017-05-30 DIAGNOSIS — E7800 Pure hypercholesterolemia, unspecified: Secondary | ICD-10-CM | POA: Diagnosis not present

## 2017-05-30 DIAGNOSIS — R634 Abnormal weight loss: Secondary | ICD-10-CM

## 2017-05-30 DIAGNOSIS — H6123 Impacted cerumen, bilateral: Secondary | ICD-10-CM

## 2017-05-30 DIAGNOSIS — D508 Other iron deficiency anemias: Secondary | ICD-10-CM

## 2017-05-30 DIAGNOSIS — K219 Gastro-esophageal reflux disease without esophagitis: Secondary | ICD-10-CM

## 2017-05-30 MED ORDER — HYDROCORTISONE 2.5 % EX OINT: TOPICAL_OINTMENT | Freq: Two times a day (BID) | CUTANEOUS | 0 refills | Status: DC

## 2017-05-30 MED ORDER — HYDROCHLOROTHIAZIDE 12.5 MG PO CAPS: ORAL_CAPSULE | ORAL | 0 refills | Status: DC

## 2017-05-30 MED ORDER — METOPROLOL SUCCINATE ER 50 MG PO TB24: 50.0000 mg | ORAL_TABLET | Freq: Every day | ORAL | 1 refills | Status: DC

## 2017-05-30 MED ORDER — MIRTAZAPINE 15 MG PO TABS: 15.0000 mg | ORAL_TABLET | Freq: Every day | ORAL | 1 refills | Status: DC

## 2017-05-30 MED ORDER — VERAPAMIL HCL ER 240 MG PO TBCR: 240.0000 mg | EXTENDED_RELEASE_TABLET | Freq: Every day | ORAL | 1 refills | Status: DC

## 2017-05-30 MED ORDER — GABAPENTIN 800 MG PO TABS: 800.0000 mg | ORAL_TABLET | Freq: Every day | ORAL | 1 refills | Status: DC

## 2017-05-30 NOTE — Patient Instructions (Signed)
     IF you received an x-ray today, you will receive an invoice from [facility]. Please contact [facility] at [phone] with questions or concerns regarding your invoice.   IF you received labwork today, you will receive an invoice from [facility]. Please contact [facility] at [phone] with questions or concerns regarding your invoice.   Our billing staff will not be able to assist you with questions regarding bills from these companies.  You will be contacted with the lab results as soon as they are available. The fastest way to get your results is to activate your My Chart account. Instructions are located on the last page of this paperwork. If you have not heard from us regarding the results in 2 weeks, please contact this office.     

## 2017-05-30 NOTE — Progress Notes (Signed)
Subjective:    Patient ID: [name], female    DOB: September 25, 1933, 83 y.o.   MRN: [id number]  05/30/2017  Vaginal Itching and Hearing Problem (pt concerned her ears may be clogged. )   HPI This 83 y.o. female presents for evaluation of hypertension, hypercholesterolemia, hyperglycemia, vaginal itching and hearing loss/cerumen impaction. Patient reports good compliance with medication, good tolerance to medication, and good symptom control.     GERD/Hiatal hernia/unintentional weight loss: GI increased Remeron to 15mg  daily on 05/03/17 for increase in appetite. Denies n/v/d/c; denies bloody stools or melena.  Weight down slightly from visit with GI one month ago.   S/p cataract surgery 02/19/17.  Two weeks later had second cataract surgery; Dr. Carolynn Sayers.    Gramig is Copy in the past.  Vaginal itching: chronic recurrent issue for patient; has nystatin cream at home that helps intermittently.  Has been followed by gynecology/[facility]; rx provided in the past.     Immunization History  Administered Date(s) Administered  . Influenza Split 10/12/2011  . Influenza Whole 09/20/2012  . Influenza,inj,Quad PF,36+ Mos 10/07/2013, 09/03/2014, 08/26/2015, 10/11/2016  . Pneumococcal Conjugate-13 07/21/2014  . Pneumococcal Polysaccharide-23 12/24/2001, 12/24/2006, 11/04/2016  . Tdap 07/15/2007, 04/28/2014   BP Readings from Last 3 Encounters:  05/30/17 122/60  05/16/17 (!) 155/64  05/03/17 (!) 124/58   Wt Readings from Last 3 Encounters:  05/30/17 109 lb (49.4 kg)  05/03/17 113 lb (51.3 kg)  11/04/16 109 lb (49.4 kg)    Review of Systems  Constitutional: Negative for chills, diaphoresis, fatigue and fever.  HENT: Positive for hearing loss. Negative for congestion, dental problem, drooling, ear pain, postnasal drip, rhinorrhea, sinus pain, sinus pressure, sore throat and trouble swallowing.   Eyes: Negative for visual disturbance.  Respiratory: Negative for cough and shortness of breath.    Cardiovascular: Negative for chest pain, palpitations and leg swelling.  Gastrointestinal: Negative for abdominal distention, abdominal pain, anal bleeding, blood in stool, constipation, diarrhea, nausea, rectal pain and vomiting.  Endocrine: Negative for cold intolerance, heat intolerance, polydipsia, polyphagia and polyuria.  Genitourinary: Negative for decreased urine volume, dysuria, flank pain, frequency, genital sores, hematuria, pelvic pain, urgency, vaginal bleeding, vaginal discharge and vaginal pain.  Neurological: Negative for dizziness, tremors, seizures, syncope, facial asymmetry, speech difficulty, weakness, light-headedness, numbness and headaches.  Psychiatric/Behavioral: Negative for dysphoric mood. The patient is not nervous/anxious.     Past Medical History:  Diagnosis Date  . Abnormal finding on Pap smear, ASCUS   . Acute renal insufficiency   . Amputation finger    index  . Barrett's esophagus   . CAD (coronary artery disease)   . Dementia   . Diverticulosis   . Elevated BP   . Foot deformity   . GERD (gastroesophageal reflux disease)   . Glaucoma   . H/O: GI bleed   . HSV-2 (herpes simplex virus 2) infection   . Hyperglycemia   . Hypertension   . Migraine   . Osteoporosis    worsening  . Spondylosis    Past Surgical History:  Procedure Laterality Date  . AMPUTATION FINGER / THUMB     secondary to osteomyelitis  . COLONOSCOPY    . ESOPHAGOGASTRODUODENOSCOPY N/A 10/22/2016   Procedure: ESOPHAGOGASTRODUODENOSCOPY (EGD);  Surgeon: Doran Stabler, MD;  Location: [facility] ENDOSCOPY;  Service: Endoscopy;  Laterality: N/A;  . FOOT SURGERY     right  . PARTIAL HYSTERECTOMY    . UPPER GASTROINTESTINAL ENDOSCOPY     No Known Allergies  Social History   Social History  . Marital status: Single    Spouse name: N/A  . Number of children: 1  . Years of education: N/A   Occupational History  .  Retired   Social History Main Topics  . Smoking status:  Never Smoker  . Smokeless tobacco: Never Used  . Alcohol use No  . Drug use: No  . Sexual activity: No   Other Topics Concern  . Not on file   Social History Narrative  . No narrative on file   Family History  Problem Relation Age of Onset  . Breast cancer Mother   . Heart attack Mother   . Heart disease Father   . Alcohol abuse Son   . Colon cancer Neg Hx   . Stomach cancer Neg Hx   . Rectal cancer Neg Hx        Objective:    BP 122/60   Pulse 89   Temp 97 F (36.1 C) (Oral)   Resp 18   Ht 4' 10.27" (1.48 m)   Wt 109 lb (49.4 kg)   SpO2 98%   BMI 22.57 kg/m  Physical Exam  Constitutional: She is oriented to person, place, and time. She appears well-developed and well-nourished. No distress.  HENT:  Head: Normocephalic and atraumatic.  Right Ear: External ear normal.  Left Ear: External ear normal.  Nose: Nose normal.  Mouth/Throat: Oropharynx is clear and moist.  B cerumen impaction.  Eyes: Conjunctivae and EOM are normal. Pupils are equal, round, and reactive to light.  Neck: Normal range of motion. Neck supple. Carotid bruit is not present. No thyromegaly present.  Cardiovascular: Normal rate, regular rhythm, normal heart sounds and intact distal pulses.  Exam reveals no gallop and no friction rub.   No murmur heard. Pulmonary/Chest: Effort normal and breath sounds normal. She has no wheezes. She has no rales.  Abdominal: Soft. Bowel sounds are normal. She exhibits no distension and no mass. There is no tenderness. There is no rebound and no guarding.  Genitourinary:     Genitourinary Comments: Hypopigmented scaling rash B labia.  No associated erythema.  Lymphadenopathy:    She has no cervical adenopathy.  Neurological: She is alert and oriented to person, place, and time. No cranial nerve deficit.  Skin: Skin is warm and dry. No rash noted. She is not diaphoretic. No erythema. No pallor.  Psychiatric: She has a normal mood and affect. Her behavior is  normal.   Results for orders placed or performed in visit on 02/02/17  HM MAMMOGRAPHY  Result Value Ref Range   HM Mammogram 0-4 Bi-Rad 0-4 Bi-Rad, Self Reported Normal       Assessment & Plan:   1. Essential hypertension   2. Gastroesophageal reflux disease without esophagitis   3. Loss of weight   4. Pure hypercholesterolemia   5. Iron deficiency anemia secondary to inadequate dietary iron intake   6. Hyperglycemia   7. Hearing loss due to cerumen impaction, bilateral    -blood pressure controlled; obtain labs; refills provided. -B cerumen impaction; s/p irrigation B ears in office today by [place] CMA. -vaginal itching most consistent with lichen sclerosis; s/p gynecological evaluation in the past; will obtain records.  Chronic recurrent issue for patient; rx for hydrocortisone ointment provided. -s/p GI consultation recently; weight stable yet not increasing; GI increased Remeron to 15 mg qhs and agreeable; also agree there is underlying anxiety; consider SSRI at next visit.  Orders Placed This Encounter  Procedures  . CBC with Differential/Platelet  . Comprehensive metabolic panel    Order Specific Question:   Has the patient fasted?    Answer:   Yes  . Lipid panel    Order Specific Question:   Has the patient fasted?    Answer:   Yes  . Hemoglobin A1c  . Iron  . Ear wax removal   Meds ordered this encounter  Medications  . metoprolol succinate (TOPROL-XL) 50 MG 24 hr tablet    Sig: Take 1 tablet (50 mg total) by mouth daily. Take with or immediately following a meal.    Dispense:  90 tablet    Refill:  1  . hydrochlorothiazide (MICROZIDE) 12.5 MG capsule    Sig: take 1 capsule by mouth EVERY TUESDAY AND SATURDAY    Dispense:  90 capsule    Refill:  0  . verapamil (CALAN-SR) 240 MG CR tablet    Sig: Take 1 tablet (240 mg total) by mouth daily.    Dispense:  90 tablet    Refill:  1  . mirtazapine (REMERON) 15 MG tablet    Sig: Take 1 tablet (15 mg total) by  mouth at bedtime.    Dispense:  90 tablet    Refill:  1  . gabapentin (NEURONTIN) 800 MG tablet    Sig: Take 1 tablet (800 mg total) by mouth at bedtime.    Dispense:  90 tablet    Refill:  1  . hydrocortisone 2.5 % ointment    Sig: Apply topically 2 (two) times daily.    Dispense:  30 g    Refill:  0    No Follow-up on file.   Kymberlyn Eckford Elayne Guerin, M.D. [facility] at [facility] [place] [address] [place], [place]  [address] [phone] phone [phone] fax

## 2017-05-31 LAB — CBC WITH DIFFERENTIAL/PLATELET
Basophils Absolute: 0 10*3/uL (ref 0.0–0.2)
Basos: 0 %
EOS (ABSOLUTE): 0.4 10*3/uL (ref 0.0–0.4)
Eos: 8 %
Hematocrit: 34.6 % (ref 34.0–46.6)
Hemoglobin: 10.4 g/dL — ABNORMAL LOW (ref 11.1–15.9)
IMMATURE GRANULOCYTES: 0 %
Immature Grans (Abs): 0 10*3/uL (ref 0.0–0.1)
Lymphocytes Absolute: 0.9 10*3/uL (ref 0.7–3.1)
Lymphs: 18 %
MCH: 26 pg — ABNORMAL LOW (ref 26.6–33.0)
MCHC: 30.1 g/dL — ABNORMAL LOW (ref 31.5–35.7)
MCV: 87 fL (ref 79–97)
MONOS ABS: 0.4 10*3/uL (ref 0.1–0.9)
Monocytes: 8 %
NEUTROS PCT: 66 %
Neutrophils Absolute: 3.3 10*3/uL (ref 1.4–7.0)
Platelets: 429 10*3/uL — ABNORMAL HIGH (ref 150–379)
RBC: 4 x10E6/uL (ref 3.77–5.28)
RDW: 15.5 % — AB (ref 12.3–15.4)
WBC: 5 10*3/uL (ref 3.4–10.8)

## 2017-05-31 LAB — COMPREHENSIVE METABOLIC PANEL
ALT: 9 IU/L (ref 0–32)
AST: 13 IU/L (ref 0–40)
Albumin/Globulin Ratio: 1.4 (ref 1.2–2.2)
Albumin: 3.8 g/dL (ref 3.5–4.7)
Alkaline Phosphatase: 51 IU/L (ref 39–117)
BUN/Creatinine Ratio: 15 (ref 12–28)
BUN: 12 mg/dL (ref 8–27)
Bilirubin Total: 0.2 mg/dL (ref 0.0–1.2)
CALCIUM: 9.8 mg/dL (ref 8.7–10.3)
CO2: 27 mmol/L (ref 18–29)
CREATININE: 0.8 mg/dL (ref 0.57–1.00)
Chloride: 104 mmol/L (ref 96–106)
GFR calc Af Amer: 80 mL/min/{1.73_m2} (ref >59)
GFR calc non Af Amer: 69 mL/min/{1.73_m2} (ref >59)
GLUCOSE: 83 mg/dL (ref 65–99)
Globulin, Total: 2.8 g/dL (ref 1.5–4.5)
POTASSIUM: 4.3 mmol/L (ref 3.5–5.2)
Sodium: 144 mmol/L (ref 134–144)
Total Protein: 6.6 g/dL (ref 6.0–8.5)

## 2017-05-31 LAB — IRON: Iron: 148 ug/dL — ABNORMAL HIGH (ref 27–139)

## 2017-05-31 LAB — LIPID PANEL
CHOL/HDL RATIO: 2.1 ratio (ref 0.0–4.4)
Cholesterol, Total: 176 mg/dL (ref 100–199)
HDL: 82 mg/dL (ref >39)
LDL Calculated: 80 mg/dL (ref 0–99)
TRIGLYCERIDES: 70 mg/dL (ref 0–149)
VLDL Cholesterol Cal: 14 mg/dL (ref 5–40)

## 2017-05-31 LAB — HEMOGLOBIN A1C
Est. average glucose Bld gHb Est-mCnc: 108 mg/dL
Hgb A1c MFr Bld: 5.4 % (ref 4.8–5.6)

## 2017-06-05 ENCOUNTER — Telehealth: Payer: Self-pay | Admitting: Gastroenterology

## 2017-06-05 NOTE — Telephone Encounter (Signed)
Patient states that when she takes the Remeron it causes her to not sleep. She states she is having a good appetite, she admits to not taking the medication all the time. I told her I was not sure if it was safe to stop that all at once and needed to check with the doctor. Please advise.

## 2017-06-05 NOTE — Telephone Encounter (Signed)
Thanks for the update.  This is a bit surprising as one of the main side effects of Remeron is drowsiness.  I had increased her dose, and sounds like it has helped her appetite. Did she have the same symptoms at the lower dose? I would recommend she decrease to 7.5mg  q HS for a week and see if it still occurs. If it does and she thinks the symptoms are still related she can then stop it.

## 2017-06-05 NOTE — Telephone Encounter (Signed)
Pt is needing to talk with someone about what Remeron is for and that is makes her not be able to sleep   Best number [phone]

## 2017-06-06 ENCOUNTER — Other Ambulatory Visit: Payer: Self-pay

## 2017-06-06 MED ORDER — MIRTAZAPINE 7.5 MG PO TABS: 7.5000 mg | ORAL_TABLET | Freq: Every day | ORAL | 3 refills | Status: DC

## 2017-06-06 NOTE — Telephone Encounter (Signed)
Patient would like to stop taking the 15 mg of Remeron and go back to the 7.5 mg qhs of Remeron. Sent in new Rx. Advised patient that if she is still having trouble sleeping to stop taking it. Patient states that her appetite is doing better, she is drinking a small cup of juice just prior to eating and this helps to stimulate eating.

## 2017-06-06 NOTE — Telephone Encounter (Signed)
No answer, will try later

## 2017-06-13 ENCOUNTER — Ambulatory Visit (INDEPENDENT_AMBULATORY_CARE_PROVIDER_SITE_OTHER): Payer: Medicare Other | Admitting: Podiatry

## 2017-06-13 DIAGNOSIS — M79676 Pain in unspecified toe(s): Secondary | ICD-10-CM

## 2017-06-13 DIAGNOSIS — B351 Tinea unguium: Secondary | ICD-10-CM

## 2017-06-13 DIAGNOSIS — L84 Corns and callosities: Secondary | ICD-10-CM | POA: Diagnosis not present

## 2017-06-18 ENCOUNTER — Encounter: Payer: Self-pay | Admitting: Family Medicine

## 2017-06-18 ENCOUNTER — Telehealth: Payer: Self-pay | Admitting: *Deleted

## 2017-06-18 NOTE — Progress Notes (Signed)
Letter sent.

## 2017-06-18 NOTE — Telephone Encounter (Signed)
Letter sent.

## 2017-06-19 NOTE — Progress Notes (Signed)
   SUBJECTIVE Patient with a history of diabetes mellitus presents to office today complaining of elongated, thickened nails. Pain while ambulating in shoes. Patient is unable to trim their own nails. She also complains of calluses to heels and lateral sides of the feet bilaterally.  OBJECTIVE General Patient is awake, alert, and oriented x 3 and in no acute distress. Derm Skin is dry and supple bilateral. Negative open lesions or macerations. Remaining integument unremarkable. Nails are tender, long, thickened and dystrophic with subungual debris, consistent with onychomycosis, 1-5 bilateral. No signs of infection noted. Hyperkeratotic lesion present on the right heel. Pain on palpation with a central nucleated core noted. Vasc  DP and PT pedal pulses palpable bilaterally. Temperature gradient within normal limits.  Neuro Epicritic and protective threshold sensation diminished bilaterally.  Musculoskeletal Exam No symptomatic pedal deformities noted bilateral. Muscular strength within normal limits.  ASSESSMENT 1. Diabetes Mellitus w/ peripheral neuropathy 2. Onychomycosis of nail due to dermatophyte bilateral 3. Pain in foot bilateral 4. Porokeratosis right heel  PLAN OF CARE 1. Patient evaluated today. 2. Instructed to maintain good pedal hygiene and foot care. Stressed importance of controlling blood sugar.  3. Mechanical debridement of nails 1-5 bilaterally performed using a nail nipper. Filed with dremel without incident.  4. Excisional debridement of keratotic lesion using a chisel blade was performed without incident.  5. Treated area(s) with Salinocaine and dressed with light dressing. 6. Return to clinic in 3 mos.     Edrick Kins, DPM [facility]  Dr. Edrick Kins, [place]                                        [place], [place] [address]                Office [phone]  Fax [phone]

## 2017-07-02 DIAGNOSIS — M79642 Pain in left hand: Secondary | ICD-10-CM | POA: Diagnosis not present

## 2017-07-02 DIAGNOSIS — M25531 Pain in right wrist: Secondary | ICD-10-CM | POA: Diagnosis not present

## 2017-07-03 ENCOUNTER — Telehealth: Payer: Self-pay | Admitting: Family Medicine

## 2017-07-03 NOTE — Telephone Encounter (Signed)
Pt calling asking if she should continue to take 2 iron pills for anemia or if she could take one and be given a list of foods to eat with iron. She was asking if she should go ahead and take 1 iron pill or 2 and I told her I would put a message in to find out. She is afraid taking 2 iron pills may be harmful to her so she wanted to know what was the best thing to do. Please advise. Pt callback number is [phone].

## 2017-07-03 NOTE — Telephone Encounter (Signed)
Dr. Tamala Julian, I looked on the last ov. I did not see current recommendations for iron use.  Pls advise. Thank you

## 2017-07-06 NOTE — Telephone Encounter (Signed)
Call -- patient should only take ONE iron tablet daily.

## 2017-07-10 NOTE — Telephone Encounter (Signed)
Pt advised.

## 2017-07-24 ENCOUNTER — Other Ambulatory Visit: Payer: Self-pay | Admitting: Family Medicine

## 2017-07-30 ENCOUNTER — Telehealth: Payer: Self-pay | Admitting: Family Medicine

## 2017-07-30 NOTE — Telephone Encounter (Signed)
Please advise.Marland Kitchen...would you like to send something in or does pt need an OV was seen in 05/2017 last.

## 2017-07-30 NOTE — Telephone Encounter (Signed)
Pt states that she has not been sleeping at night and would like you to call her in something.  Contact number [phone]

## 2017-07-31 MED ORDER — MIRTAZAPINE 15 MG PO TABS
15.0000 mg | ORAL_TABLET | Freq: Every day | ORAL | 2 refills | Status: DC
Start: 2017-07-31 — End: 2017-12-10

## 2017-07-31 NOTE — Telephone Encounter (Signed)
Call --- I have sent in a higher dose of Mirtazepine/Remeron for patient.  This medication helps with sleep and appetite.  She has been prescribed 7.5mg  and I have increased it to 15mg  every night.  Has she been taking Mirtazepine every night?

## 2017-07-31 NOTE — Telephone Encounter (Signed)
Unable to reach patient no answer no voice mail. 

## 2017-08-02 NOTE — Telephone Encounter (Signed)
Second call no answer. Unable to reach letter sent.

## 2017-08-07 DIAGNOSIS — H401111 Primary open-angle glaucoma, right eye, mild stage: Secondary | ICD-10-CM | POA: Diagnosis not present

## 2017-08-07 DIAGNOSIS — H401122 Primary open-angle glaucoma, left eye, moderate stage: Secondary | ICD-10-CM | POA: Diagnosis not present

## 2017-08-07 DIAGNOSIS — Z961 Presence of intraocular lens: Secondary | ICD-10-CM | POA: Diagnosis not present

## 2017-08-14 NOTE — Telephone Encounter (Signed)
Pt states that's she is sleeping well now but she has stopped taking the Remeron it makes her nausea and that she's not depressed

## 2017-08-14 NOTE — Telephone Encounter (Signed)
Dr [name] Pt also states that she need a referral for a home nurse to come help her each day the agency that was helping her was [facility] home [facility] services they closed down someone came in each day to help clean cook and drive her to certain places once a week because of her hand/finger please respond

## 2017-08-15 NOTE — Telephone Encounter (Signed)
Please advise 

## 2017-08-16 NOTE — Telephone Encounter (Signed)
Pt already reached to home aid services.

## 2017-08-16 NOTE — Telephone Encounter (Signed)
Attempted to reach patient, number listed busy. Will try again.

## 2017-08-16 NOTE — Telephone Encounter (Signed)
Call patient --- Dr. Everlene Farrier referred her for home health services two years ago.  We will need to discuss at her next visit.

## 2017-09-11 DIAGNOSIS — G894 Chronic pain syndrome: Secondary | ICD-10-CM | POA: Diagnosis not present

## 2017-09-11 DIAGNOSIS — M5136 Other intervertebral disc degeneration, lumbar region: Secondary | ICD-10-CM | POA: Diagnosis not present

## 2017-09-19 ENCOUNTER — Ambulatory Visit: Payer: Medicare Other | Admitting: Podiatry

## 2017-09-24 DIAGNOSIS — D2272 Melanocytic nevi of left lower limb, including hip: Secondary | ICD-10-CM | POA: Diagnosis not present

## 2017-09-24 DIAGNOSIS — D2261 Melanocytic nevi of right upper limb, including shoulder: Secondary | ICD-10-CM | POA: Diagnosis not present

## 2017-09-24 DIAGNOSIS — D485 Neoplasm of uncertain behavior of skin: Secondary | ICD-10-CM | POA: Diagnosis not present

## 2017-09-24 DIAGNOSIS — L298 Other pruritus: Secondary | ICD-10-CM | POA: Diagnosis not present

## 2017-09-24 DIAGNOSIS — L821 Other seborrheic keratosis: Secondary | ICD-10-CM | POA: Diagnosis not present

## 2017-09-24 DIAGNOSIS — D2271 Melanocytic nevi of right lower limb, including hip: Secondary | ICD-10-CM | POA: Diagnosis not present

## 2017-09-25 ENCOUNTER — Other Ambulatory Visit: Payer: Self-pay | Admitting: Family Medicine

## 2017-09-26 ENCOUNTER — Telehealth: Payer: Self-pay | Admitting: Gastroenterology

## 2017-09-26 MED ORDER — PANTOPRAZOLE SODIUM 40 MG PO TBEC: 40.0000 mg | DELAYED_RELEASE_TABLET | Freq: Every day | ORAL | 5 refills | Status: DC

## 2017-09-26 NOTE — Telephone Encounter (Signed)
Patient states she needs medication pantoprazole refilled.

## 2017-09-26 NOTE — Telephone Encounter (Signed)
Rx sent 

## 2017-10-01 DIAGNOSIS — Z23 Encounter for immunization: Secondary | ICD-10-CM | POA: Diagnosis not present

## 2017-10-15 ENCOUNTER — Ambulatory Visit (INDEPENDENT_AMBULATORY_CARE_PROVIDER_SITE_OTHER): Payer: Medicare Other | Admitting: Podiatry

## 2017-10-15 ENCOUNTER — Encounter: Payer: Self-pay | Admitting: Podiatry

## 2017-10-15 DIAGNOSIS — L989 Disorder of the skin and subcutaneous tissue, unspecified: Secondary | ICD-10-CM | POA: Diagnosis not present

## 2017-10-15 DIAGNOSIS — B351 Tinea unguium: Secondary | ICD-10-CM | POA: Diagnosis not present

## 2017-10-15 DIAGNOSIS — M79676 Pain in unspecified toe(s): Secondary | ICD-10-CM

## 2017-10-15 DIAGNOSIS — L84 Corns and callosities: Secondary | ICD-10-CM

## 2017-10-17 NOTE — Progress Notes (Signed)
    Subjective: Patient is a 83 y.o. female presenting to the office today with a chief complaint of painful callus lesions to the bilateral feet that have been ongoing for the past several months.  Patient also complains of elongated, thickened nails that cause pain while ambulating in shoes. Patient is unable to trim their own nails. Patient presents today for further treatment and evaluation.    Past Medical History:  Diagnosis Date  . Abnormal finding on Pap smear, ASCUS   . Acute renal insufficiency   . Amputation finger    index  . Barrett's esophagus   . CAD (coronary artery disease)   . Dementia   . Diverticulosis   . Elevated BP   . Foot deformity   . GERD (gastroesophageal reflux disease)   . Glaucoma   . H/O: GI bleed   . HSV-2 (herpes simplex virus 2) infection   . Hyperglycemia   . Hypertension   . Migraine   . Osteoporosis    worsening  . Spondylosis     Objective:  Physical Exam General: Alert and oriented x3 in no acute distress  Dermatology: Hyperkeratotic lesions present on the bilateral feet x 3. Pain on palpation with a central nucleated core noted. Skin is warm, dry and supple bilateral lower extremities. Negative for open lesions or macerations. Nails are tender, long, thickened and dystrophic with subungual debris, consistent with onychomycosis, 1-5 bilateral. No signs of infection noted.  Vascular: Palpable pedal pulses bilaterally. No edema or erythema noted. Capillary refill within normal limits.  Neurological: Epicritic and protective threshold grossly intact bilaterally.   Musculoskeletal Exam: Pain on palpation at the keratotic lesion noted. Range of motion within normal limits bilateral. Muscle strength 5/5 in all groups bilateral.  Assessment: 1. Onychodystrophic nails 1-5 bilateral with hyperkeratosis of nails.  2. Onychomycosis of nail due to dermatophyte bilateral 3. Pre-ulcerative callus lesions to the bilateral feet x 3   Plan of  Care:  #1 Patient evaluated. #2 Excisional debridement of keratoic lesion using a chisel blade was performed without incident.  #3 Dressed with light dressing. #4 Mechanical debridement of nails 1-5 bilaterally performed using a nail nipper. Filed with dremel without incident.  #5 Patient is to return to the clinic in 3 months.   Edrick Kins, DPM [facility]  Dr. Edrick Kins, [place]                                        [place],  [address]                Office [phone]  Fax [phone]

## 2017-11-09 ENCOUNTER — Other Ambulatory Visit: Payer: Self-pay | Admitting: Family Medicine

## 2017-11-16 ENCOUNTER — Other Ambulatory Visit: Payer: Self-pay | Admitting: Family Medicine

## 2017-11-21 ENCOUNTER — Other Ambulatory Visit: Payer: Self-pay

## 2017-11-21 ENCOUNTER — Encounter: Payer: Self-pay | Admitting: Physician Assistant

## 2017-11-21 ENCOUNTER — Other Ambulatory Visit: Payer: Self-pay | Admitting: Family Medicine

## 2017-11-21 ENCOUNTER — Ambulatory Visit (INDEPENDENT_AMBULATORY_CARE_PROVIDER_SITE_OTHER): Payer: Medicare Other | Admitting: Physician Assistant

## 2017-11-21 VITALS — BP 138/80 | HR 84 | Temp 97.9°F | Resp 16 | Wt 104.6 lb

## 2017-11-21 DIAGNOSIS — H6122 Impacted cerumen, left ear: Secondary | ICD-10-CM | POA: Diagnosis not present

## 2017-11-21 DIAGNOSIS — H6123 Impacted cerumen, bilateral: Secondary | ICD-10-CM | POA: Diagnosis not present

## 2017-11-21 DIAGNOSIS — H9193 Unspecified hearing loss, bilateral: Secondary | ICD-10-CM | POA: Diagnosis not present

## 2017-11-21 DIAGNOSIS — H9192 Unspecified hearing loss, left ear: Secondary | ICD-10-CM | POA: Diagnosis not present

## 2017-11-21 DIAGNOSIS — H6121 Impacted cerumen, right ear: Secondary | ICD-10-CM | POA: Diagnosis not present

## 2017-11-21 DIAGNOSIS — H60501 Unspecified acute noninfective otitis externa, right ear: Secondary | ICD-10-CM

## 2017-11-21 DIAGNOSIS — H9191 Unspecified hearing loss, right ear: Secondary | ICD-10-CM | POA: Diagnosis not present

## 2017-11-21 MED ORDER — CIPROFLOXACIN-DEXAMETHASONE 0.3-0.1 % OT SUSP: 4.0000 [drp] | Freq: Two times a day (BID) | OTIC | 0 refills | Status: AC

## 2017-11-21 NOTE — Patient Instructions (Addendum)
The ear wax was successfully removed.  It does appear that you are having an outer ear infection in the right ear.  Please use antibiotic drops as prescribed.  To use the ear drops: -Lie down or tilt your head with your ear facing upward. Open the ear canal by gently pulling your ear back, or pulling downward on the earlobe when giving this medicine to a child. -Hold the dropper upside down over your ear and drop the correct number of drops into the ear. -Stay lying down or with your head tilted for at least 5 minutes. You may use a small piece of cotton to plug the ear and keep the medicine from draining out. -Do not touch the dropper tip or place it directly in your ear. It may become contaminated. Wipe the tip with a clean tissue but do not wash with water or soap. -Use this medicine for the full prescribed length of time. Your symptoms may improve before the infection is completely cleared. Skipping doses may also increase your risk of further infection that is resistant to antibiotics.  I have also placed a referral for an audiologist and they should contact you within 1-2 weeks to schedule an appointment.  Thank you for letting me participate in your health and well being.     IF you received an x-ray today, you will receive an invoice from [facility]. Please contact [facility] at [phone] with questions or concerns regarding your invoice.   IF you received labwork today, you will receive an invoice from [facility]. Please contact [facility] at [phone] with questions or concerns regarding your invoice.   Our billing staff will not be able to assist you with questions regarding bills from these companies.  You will be contacted with the lab results as soon as they are available. The fastest way to get your results is to activate your My Chart account. Instructions are located on the last page of this paperwork. If you have not heard from [place] regarding the results in  2 weeks, please contact this office.

## 2017-11-21 NOTE — Progress Notes (Signed)
[name]  MRN: [id number] DOB: 29-Apr-1933  Subjective:  [name] is a 83 y.o. female seen in office today for a chief complaint of worsening decreased hearing x 2 weeks.  Patient has had an issue with this in the past and had to have her ears cleaned out.  She is also having some right ear itching and mild pain x 1 week.  She does endorse tendinitis, but this has been present for years and has not worsened.  She denies ear drainage, left ear pain, left ear itching, dizziness, nausea, and vomiting.  She denies use of Q-tips.  Patient would also like to be referred to an audiologist  to be evaluated for age associated hearing loss to see if she needs hearing aids.  No history of diabetes.  Has no other questions or complaints today.  Review of Systems  Constitutional: Negative for chills, diaphoresis and fever.    Patient Active Problem List   Diagnosis Date Noted  . Hiatal hernia 11/04/2016  . Acute blood loss anemia   . Upper GI bleed 10/21/2016  . Rectal bleeding 11/25/2014  . Gastroesophageal reflux disease without esophagitis 07/31/2014  . Loss of weight 07/31/2014  . Gastric ulcer without hemorrhage or perforation 07/22/2013  . Syphilis 02/27/2012  . Dementia   . Spondylosis   . Foot deformity   . Migraine   . CAD (coronary artery disease)   . Acute renal insufficiency   . GERD (gastroesophageal reflux disease)   . Hyperglycemia   . HSV-2 (herpes simplex virus 2) infection   . Abnormal finding on Pap smear, ASCUS   . Osteoporosis   . CONSTIPATION 12/12/2010  . PUD, HX OF 11/10/2010  . ANEMIA, IRON DEFICIENCY 06/17/2008  . BARRETTS ESOPHAGUS 06/17/2008  . GLAUCOMA 06/16/2008  . Essential hypertension 06/16/2008  . DIVERTICULOSIS, COLON 06/16/2008  . ARTHRITIS 06/16/2008  . SLEEP APNEA 06/16/2008    Current Outpatient Medications on File Prior to Visit  Medication Sig Dispense Refill  . aspirin 81 MG tablet Take 81 mg by mouth every morning.     . Calcium  Carbonate-Vitamin D (CALCIUM-VITAMIN D) 500-200 MG-UNIT tablet Take 3 tablets by mouth every morning.    . cetirizine (ZYRTEC HIVES RELIEF) 10 MG tablet Take 1 tablet (10 mg total) by mouth daily. 7 tablet 0  . cyclobenzaprine (FLEXERIL) 5 MG tablet TAKE 1/2 TO 1 TABLET BY MOUTH  UP TO TWICE DAILY AS NEEDED FOR MUSCLE SPASMS 20 tablet 3  . gabapentin (NEURONTIN) 800 MG tablet Take 1 tablet (800 mg total) by mouth at bedtime. 90 tablet 1  . hydrochlorothiazide (MICROZIDE) 12.5 MG capsule take 1 capsule by mouth EVERY TUESDAY AND SATURDAY 90 capsule 0  . hydrocortisone 2.5 % ointment apply to affected area twice a day 28.35 g 0  . hydroxypropyl methylcellulose (ISOPTO TEARS) 2.5 % ophthalmic solution Place 1 drop into both eyes 2 (two) times daily as needed for dry eyes.     Marland Kitchen lactose free nutrition (BOOST) LIQD Take 237 mLs by mouth 2 (two) times daily between meals. (Patient taking differently: Take 1 Container by mouth 2 (two) times daily as needed (nutritional). ) 60 Can 11  . metoprolol succinate (TOPROL-XL) 50 MG 24 hr tablet take 1 tablet by mouth once daily WITH OR IMMEDIATELY FOLLOWING A MEAL 90 tablet 0  . mirtazapine (REMERON) 15 MG tablet Take 1 tablet (15 mg total) by mouth at bedtime. 30 tablet 2  . Multiple Vitamins-Minerals (HAIR/SKIN/NAILS PO) Take 1 tablet  by mouth every morning.     . nystatin (MYCOSTATIN) cream Apply 1 application topically as needed (dry skin). Reported on 03/30/2016    . pantoprazole (PROTONIX) 40 MG tablet Take 1 tablet (40 mg total) by mouth daily. 30 tablet 5  . promethazine (PHENERGAN) 12.5 MG tablet Take 1 tablet (12.5 mg total) by mouth every 6 (six) hours as needed for nausea or vomiting. 30 tablet 0  . promethazine (PHENERGAN) 25 MG tablet take 1 tablet by mouth twice a day if needed for nausea and vomiting (Patient taking differently: Take 25 mg by mouth twice a day if needed for nausea and vomiting) 30 tablet 0  . vitamin C (ASCORBIC ACID) 500 MG tablet  Take 500 mg by mouth daily.     No current facility-administered medications on file prior to visit.     No Known Allergies   Objective:  BP (!) 142/74 (BP Location: Left Arm, Patient Position: Sitting, Cuff Size: Normal)   Pulse 84   Temp 97.9 F (36.6 C) (Oral)   Resp 16   Wt 104 lb 9.6 oz (47.4 kg)   SpO2 96%   BMI 21.66 kg/m   Physical Exam  Constitutional: She is oriented to person, place, and time and well-developed, well-nourished, and in no distress.  HENT:  Head: Normocephalic and atraumatic.  Right Ear: There is drainage ( Dark brown cerumen blocking view of TM). No tenderness. No mastoid tenderness.  Left Ear: There is drainage ( Dark brown cerumen blocking view of TM). No tenderness. No mastoid tenderness.  Normal whisper test.  Eyes: Conjunctivae are normal.  Neck: Normal range of motion.  Pulmonary/Chest: Effort normal.  Neurological: She is alert and oriented to person, place, and time. Gait normal.  Skin: Skin is warm and dry.  Psychiatric: Affect normal.  Vitals reviewed. After ear lavage, right ear canal with edema and erythema.  There is moderate amount of flaky yellow debris within the ear canal and overlying TM.  No fungal  spores noted.  TM intact, no perforation, bulging, or erythema.  Left ear canal appears normal, with no erythema or edema.  Left TM intact with no bulging or erythema.  Patient notes she can hear much better now.  Assessment and Plan :  1. Decreased hearing of both ears Whisper test normal.  Hearing improved in office today with ear lavage.  However, patient does want to be evaluated for age associated hearing loss.  Referral placed. - Ambulatory referral to Audiology  2. Bilateral impacted cerumen Resolved with ear lavage.  Return as needed. -Ear lavage  3. Acute otitis externa of right ear, unspecified type Right ear  exam consistent with otitis externa after lavage performed.  Will treat with topical eardrops at this time.   Patient given educational instructions on how to use eardrops appropriately. Return to clinic if symptoms worsen, do not improve, or as needed. - ciprofloxacin-dexamethasone (CIPRODEX) OTIC suspension; Place 4 drops into the right ear 2 (two) times daily for 7 days.  Dispense: 7.5 mL; Refill: 0  Tenna Delaine PA-C  [facility] 11/21/2017 12:13 PM

## 2017-11-22 ENCOUNTER — Telehealth: Payer: Self-pay | Admitting: Family Medicine

## 2017-11-22 NOTE — Telephone Encounter (Signed)
Copied from [place]. Topic: Quick Communication - See Telephone Encounter >> Nov 22, 2017 11:03 AM Synthia Innocent wrote: CRM for notification. See Telephone encounter for:  Patient seen yesterday, wants to know how long she is to put cotton in her ear and does she need to place the drops in both ears. Please advise 11/22/17.

## 2017-11-22 NOTE — Telephone Encounter (Signed)
Pt  Called   With   [facility]    About  How to  Administer  Ear  Drops      And  How  long  To keep  The  cotton  In her  Ear. Plan of  Car   Discussed      Medication    dosage  And  Instructions  Reviewed  With  The  Patient Pt  Verbalizes  Knowledge  Of plan of  Care

## 2017-11-23 DIAGNOSIS — Z1231 Encounter for screening mammogram for malignant neoplasm of breast: Secondary | ICD-10-CM | POA: Diagnosis not present

## 2017-11-23 DIAGNOSIS — M8589 Other specified disorders of bone density and structure, multiple sites: Secondary | ICD-10-CM | POA: Diagnosis not present

## 2017-11-23 LAB — HM DEXA SCAN

## 2017-11-23 LAB — HM MAMMOGRAPHY

## 2017-11-25 ENCOUNTER — Other Ambulatory Visit: Payer: Self-pay | Admitting: Gastroenterology

## 2017-11-26 ENCOUNTER — Telehealth: Payer: Self-pay | Admitting: Gastroenterology

## 2017-11-26 NOTE — Telephone Encounter (Signed)
Rx for Phenergan 12.5 mg sent. #30 with no refills.

## 2017-11-26 NOTE — Telephone Encounter (Signed)
Patient needed a refill on her medication, asked her to contact the pharmacy to request refill.

## 2017-11-30 ENCOUNTER — Ambulatory Visit: Payer: Self-pay | Admitting: *Deleted

## 2017-11-30 NOTE — Telephone Encounter (Signed)
Called in wanting to know if she should use any more of her ear drops that were prescribed for 7 days.  I looked up her appt on the calendar she was seen on Nov. 28th.   She got the drops the next day and started them.   I told her that she finished the drops yesterday so she didn't need put any more drops in her ear.   She then questioned me about what to do with the left over drops.   I instructed her to throw them away..   She then questioned about being seen by an ear doctor.   I looked in her chart at the visit summary from Nov 28th with Dr. Timmothy Euler.   She has been referred to an audiologist.   I instructed her to call us back if she has not heard from the audiologist by next week.   She said she will call us back on Dec. 12th. She needed assistance with knowing when the 7 days was up and with when to call back if not heard from the audiologist.   She got her calendar and we counted the days together.

## 2017-12-05 ENCOUNTER — Telehealth: Payer: Self-pay | Admitting: Family Medicine

## 2017-12-05 ENCOUNTER — Ambulatory Visit: Payer: Medicare Other

## 2017-12-05 NOTE — Telephone Encounter (Signed)
Copied from [place]. Topic: Quick Communication - See Telephone Encounter >> Dec 05, 2017  2:38 PM Boyd Kerbs wrote: CRM for notification. See Telephone encounter for:  Patient calling for bone density test results 12/05/17.

## 2017-12-06 NOTE — Telephone Encounter (Signed)
Called pt. She had bone density and mammography at [facility]. Called [facility] - reports of both faxed over.  Copy put in slot for scanning to chart. Other copy to 104 - pt has appt 12/17

## 2017-12-07 ENCOUNTER — Telehealth: Payer: Self-pay

## 2017-12-07 NOTE — Telephone Encounter (Signed)
Error

## 2017-12-07 NOTE — Telephone Encounter (Signed)
Tried to call patient, at Dr. Thompson Caul request, to notify her of the results of Bone Density test.  Did not get an answer, so I typed and mailed a letter to the patient stating the results and instructions from Dr. Tamala Julian.

## 2017-12-07 NOTE — Telephone Encounter (Signed)
Tried to notify patient of results of recent bone density exam.  I was not sure she understood, so I sent her a letter with the instructions.

## 2017-12-10 ENCOUNTER — Ambulatory Visit (INDEPENDENT_AMBULATORY_CARE_PROVIDER_SITE_OTHER): Payer: Medicare Other | Admitting: Family Medicine

## 2017-12-10 ENCOUNTER — Encounter: Payer: Self-pay | Admitting: Family Medicine

## 2017-12-10 ENCOUNTER — Other Ambulatory Visit: Payer: Self-pay

## 2017-12-10 VITALS — BP 138/72 | HR 78 | Temp 98.0°F | Resp 16 | Wt 106.0 lb

## 2017-12-10 DIAGNOSIS — Z0000 Encounter for general adult medical examination without abnormal findings: Secondary | ICD-10-CM | POA: Diagnosis not present

## 2017-12-10 DIAGNOSIS — M5136 Other intervertebral disc degeneration, lumbar region: Secondary | ICD-10-CM | POA: Diagnosis not present

## 2017-12-10 DIAGNOSIS — D508 Other iron deficiency anemias: Secondary | ICD-10-CM | POA: Diagnosis not present

## 2017-12-10 DIAGNOSIS — I2510 Atherosclerotic heart disease of native coronary artery without angina pectoris: Secondary | ICD-10-CM | POA: Diagnosis not present

## 2017-12-10 DIAGNOSIS — K219 Gastro-esophageal reflux disease without esophagitis: Secondary | ICD-10-CM

## 2017-12-10 DIAGNOSIS — Z8619 Personal history of other infectious and parasitic diseases: Secondary | ICD-10-CM

## 2017-12-10 DIAGNOSIS — B009 Herpesviral infection, unspecified: Secondary | ICD-10-CM | POA: Diagnosis not present

## 2017-12-10 DIAGNOSIS — K2270 Barrett's esophagus without dysplasia: Secondary | ICD-10-CM

## 2017-12-10 DIAGNOSIS — K257 Chronic gastric ulcer without hemorrhage or perforation: Secondary | ICD-10-CM

## 2017-12-10 DIAGNOSIS — M810 Age-related osteoporosis without current pathological fracture: Secondary | ICD-10-CM | POA: Diagnosis not present

## 2017-12-10 DIAGNOSIS — G43909 Migraine, unspecified, not intractable, without status migrainosus: Secondary | ICD-10-CM | POA: Diagnosis not present

## 2017-12-10 DIAGNOSIS — I10 Essential (primary) hypertension: Secondary | ICD-10-CM

## 2017-12-10 LAB — POCT URINALYSIS DIP (MANUAL ENTRY)
BILIRUBIN UA: NEGATIVE
Blood, UA: NEGATIVE
Glucose, UA: NEGATIVE mg/dL
Ketones, POC UA: NEGATIVE mg/dL
NITRITE UA: NEGATIVE
PH UA: 7 (ref 5.0–8.0)
Protein Ur, POC: NEGATIVE mg/dL
Spec Grav, UA: 1.015 (ref 1.010–1.025)
UROBILINOGEN UA: 0.2 U/dL

## 2017-12-10 MED ORDER — HYDROCHLOROTHIAZIDE 12.5 MG PO CAPS: ORAL_CAPSULE | ORAL | 1 refills | Status: DC

## 2017-12-10 MED ORDER — GABAPENTIN 800 MG PO TABS: 800.0000 mg | ORAL_TABLET | Freq: Every day | ORAL | 1 refills | Status: DC

## 2017-12-10 MED ORDER — METOPROLOL SUCCINATE ER 50 MG PO TB24: ORAL_TABLET | ORAL | 3 refills | Status: DC

## 2017-12-10 MED ORDER — ZOSTER VAC RECOMB ADJUVANTED 50 MCG/0.5ML IM SUSR: 0.5000 mL | Freq: Once | INTRAMUSCULAR | 1 refills | Status: AC

## 2017-12-10 MED ORDER — CYCLOBENZAPRINE HCL 5 MG PO TABS: ORAL_TABLET | ORAL | 0 refills | Status: DC

## 2017-12-10 MED ORDER — MIRTAZAPINE 15 MG PO TABS
15.0000 mg | ORAL_TABLET | Freq: Every day | ORAL | 3 refills | Status: DC
Start: 2017-12-10 — End: 2018-01-04

## 2017-12-10 NOTE — Patient Instructions (Addendum)
   IF you received an x-ray today, you will receive an invoice from [facility]. Please contact [facility] at [phone] with questions or concerns regarding your invoice.   IF you received labwork today, you will receive an invoice from [facility]. Please contact [facility] at [phone] with questions or concerns regarding your invoice.   Our billing staff will not be able to assist you with questions regarding bills from these companies.  You will be contacted with the lab results as soon as they are available. The fastest way to get your results is to activate your My Chart account. Instructions are located on the last page of this paperwork. If you have not heard from us regarding the results in 2 weeks, please contact this office.      Preventive Care 81 Years and Older, Female Preventive care refers to lifestyle choices and visits with your health care provider that can promote health and wellness. What does preventive care include?  A yearly physical exam. This is also called an annual well check.  Dental exams once or twice a year.  Routine eye exams. Ask your health care provider how often you should have your eyes checked.  Personal lifestyle choices, including: ? Daily care of your teeth and gums. ? Regular physical activity. ? Eating a healthy diet. ? Avoiding tobacco and drug use. ? Limiting alcohol use. ? Practicing safe sex. ? Taking low-dose aspirin every day. ? Taking vitamin and mineral supplements as recommended by your health care provider. What happens during an annual well check? The services and screenings done by your health care provider during your annual well check will depend on your age, overall health, lifestyle risk factors, and family history of disease. Counseling Your health care provider may ask you questions about your:  Alcohol use.  Tobacco use.  Drug use.  Emotional well-being.  Home and relationship  well-being.  Sexual activity.  Eating habits.  History of falls.  Memory and ability to understand (cognition).  Work and work environment.  Reproductive health.  Screening You may have the following tests or measurements:  Height, weight, and BMI.  Blood pressure.  Lipid and cholesterol levels. These may be checked every 5 years, or more frequently if you are over 50 years old.  Skin check.  Lung cancer screening. You may have this screening every year starting at age 55 if you have a 30-pack-year history of smoking and currently smoke or have quit within the past 15 years.  Fecal occult blood test (FOBT) of the stool. You may have this test every year starting at age 50.  Flexible sigmoidoscopy or colonoscopy. You may have a sigmoidoscopy every 5 years or a colonoscopy every 10 years starting at age 50.  Hepatitis C blood test.  Hepatitis B blood test.  Sexually transmitted disease (STD) testing.  Diabetes screening. This is done by checking your blood sugar (glucose) after you have not eaten for a while (fasting). You may have this done every 1-3 years.  Bone density scan. This is done to screen for osteoporosis. You may have this done starting at age 65.  Mammogram. This may be done every 1-2 years. Talk to your health care provider about how often you should have regular mammograms.  Talk with your health care provider about your test results, treatment options, and if necessary, the need for more tests. Vaccines Your health care provider may recommend certain vaccines, such as:  Influenza vaccine. This is recommended every year.    Tetanus, diphtheria, and acellular pertussis (Tdap, Td) vaccine. You may need a Td booster every 10 years.  Varicella vaccine. You may need this if you have not been vaccinated.  Zoster vaccine. You may need this after age 60.  Measles, mumps, and rubella (MMR) vaccine. You may need at least one dose of MMR if you were born in  1957 or later. You may also need a second dose.  Pneumococcal 13-valent conjugate (PCV13) vaccine. One dose is recommended after age 65.  Pneumococcal polysaccharide (PPSV23) vaccine. One dose is recommended after age 65.  Meningococcal vaccine. You may need this if you have certain conditions.  Hepatitis A vaccine. You may need this if you have certain conditions or if you travel or work in places where you may be exposed to hepatitis A.  Hepatitis B vaccine. You may need this if you have certain conditions or if you travel or work in places where you may be exposed to hepatitis B.  Haemophilus influenzae type b (Hib) vaccine. You may need this if you have certain conditions.  Talk to your health care provider about which screenings and vaccines you need and how often you need them. This information is not intended to replace advice given to you by your health care provider. Make sure you discuss any questions you have with your health care provider. Document Released: 01/07/2016 Document Revised: 08/30/2016 Document Reviewed: 10/12/2015 Elsevier Interactive Patient Education  2017 [employer]  

## 2017-12-10 NOTE — Progress Notes (Signed)
Subjective:    Patient ID: [name], female    DOB: [birth year], 83 y.o.   MRN: [id number]  12/10/2017  Medicare Wellness and Hypertension    HPI This 83 y.o. female presents for evaluation Annual Wellness Examination and chronic medical follow-up.  Last physical:   Pap smear: n/a Mammogram:  11/2017 Colonoscopy: 2017 Bone density:  2018 Eye exam:  Glasses; cataracts; 12/2017.     Visual Acuity Screening   Right eye Left eye Both eyes  Without correction:     With correction: 20/30 20/25 20/25     BP Readings from Last 3 Encounters:  12/10/17 138/72  11/21/17 138/80  05/30/17 122/60   Wt Readings from Last 3 Encounters:  12/10/17 106 lb (48.1 kg)  11/21/17 104 lb 9.6 oz (47.4 kg)  05/30/17 109 lb (49.4 kg)   Immunization History  Administered Date(s) Administered  . Influenza Split 10/12/2011  . Influenza Whole 09/20/2012  . Influenza,inj,Quad PF,6+ Mos 10/07/2013, 09/03/2014, 08/26/2015, 10/11/2016  . Influenza-Unspecified 08/25/2017  . Pneumococcal Conjugate-13 07/21/2014  . Pneumococcal Polysaccharide-23 12/24/2001, 12/24/2006, 11/04/2016  . Tdap 07/15/2007, 04/28/2014   Health Maintenance  Topic Date Due  . TETANUS/TDAP  04/28/2024  . INFLUENZA VACCINE  Completed  . DEXA SCAN  Completed  . PNA vac Low Risk Adult  Completed    HTN: Patient reports good compliance with medication, good tolerance to medication, and good symptom control.   Hypercholesterolemia: Patient reports good compliance with medication, good tolerance to medication, and good symptom control.    GERD: Patient reports good compliance with medication, good tolerance to medication, and good symptom control.  Protonix and Phenergan per [facility].  Insomnia/weight loss: Patient reports good compliance with medication, good tolerance to medication, and good symptom control.  Remeron.  Boost.  Atrophic Vaginitis: Patient reports good compliance with medication, good tolerance to  medication, and good symptom control.  Premarin cream.    Review of Systems  Constitutional: Negative for activity change, appetite change, chills, diaphoresis, fatigue, fever and unexpected weight change.  HENT: Negative for congestion, dental problem, drooling, ear discharge, ear pain, facial swelling, hearing loss, mouth sores, nosebleeds, postnasal drip, rhinorrhea, sinus pressure, sneezing, sore throat, tinnitus, trouble swallowing and voice change.   Eyes: Negative for photophobia, pain, discharge, redness, itching and visual disturbance.  Respiratory: Negative for apnea, cough, choking, chest tightness, shortness of breath, wheezing and stridor.   Cardiovascular: Negative for chest pain, palpitations and leg swelling.  Gastrointestinal: Negative for abdominal distention, abdominal pain, anal bleeding, blood in stool, constipation, diarrhea, nausea, rectal pain and vomiting.  Endocrine: Negative for cold intolerance, heat intolerance, polydipsia, polyphagia and polyuria.  Genitourinary: Negative for decreased urine volume, difficulty urinating, dyspareunia, dysuria, enuresis, flank pain, frequency, genital sores, hematuria, menstrual problem, pelvic pain, urgency, vaginal bleeding, vaginal discharge and vaginal pain.       Nocturia x 0..  Urinary leakage YES.  Musculoskeletal: Negative for arthralgias, back pain, gait problem, joint swelling, myalgias, neck pain and neck stiffness.  Skin: Negative for color change, pallor, rash and wound.  Allergic/Immunologic: Negative for environmental allergies, food allergies and immunocompromised state.  Neurological: Negative for dizziness, tremors, seizures, syncope, facial asymmetry, speech difficulty, weakness, light-headedness, numbness and headaches.  Hematological: Negative for adenopathy. Does not bruise/bleed easily.  Psychiatric/Behavioral: Positive for sleep disturbance. Negative for agitation, behavioral problems, confusion, decreased  concentration, dysphoric mood, hallucinations, self-injury and suicidal ideas. The patient is not nervous/anxious and is not hyperactive.        Bedtime 1100; wakes up  700.    Past Medical History:  Diagnosis Date  . Abnormal finding on Pap smear, ASCUS   . Acute renal insufficiency   . Amputation finger    index  . Barrett's esophagus   . CAD (coronary artery disease)   . Dementia   . Diverticulosis   . Elevated BP   . Foot deformity   . GERD (gastroesophageal reflux disease)   . Glaucoma   . H/O: GI bleed   . HSV-2 (herpes simplex virus 2) infection   . Hyperglycemia   . Hypertension   . Migraine   . Osteoporosis    worsening  . Spondylosis    Past Surgical History:  Procedure Laterality Date  . AMPUTATION FINGER / THUMB     secondary to osteomyelitis  . CATARACT EXTRACTION, BILATERAL Bilateral 12/25/2016  . COLONOSCOPY    . ESOPHAGOGASTRODUODENOSCOPY N/A 10/22/2016   Procedure: ESOPHAGOGASTRODUODENOSCOPY (EGD);  Surgeon: Doran Stabler, MD;  Location: [facility] ENDOSCOPY;  Service: Endoscopy;  Laterality: N/A;  . FOOT SURGERY     right  . PARTIAL HYSTERECTOMY    . UPPER GASTROINTESTINAL ENDOSCOPY     No Known Allergies Current Outpatient Medications on File Prior to Visit  Medication Sig Dispense Refill  . aspirin 81 MG tablet Take 81 mg by mouth every morning.     . Calcium Carbonate-Vitamin D (CALCIUM-VITAMIN D) 500-200 MG-UNIT tablet Take 3 tablets by mouth every morning.    . cetirizine (ZYRTEC HIVES RELIEF) 10 MG tablet Take 1 tablet (10 mg total) by mouth daily. 7 tablet 0  . FLUAD 0.5 ML SUSY inject 0.5 milliliter intramuscularly  0  . hydroxypropyl methylcellulose (ISOPTO TEARS) 2.5 % ophthalmic solution Place 1 drop into both eyes 2 (two) times daily as needed for dry eyes.     Marland Kitchen lactose free nutrition (BOOST) LIQD Take 237 mLs by mouth 2 (two) times daily between meals. (Patient taking differently: Take 1 Container by mouth 2 (two) times daily as needed  (nutritional). ) 60 Can 11  . Multiple Vitamins-Minerals (HAIR/SKIN/NAILS PO) Take 1 tablet by mouth every morning.     . pantoprazole (PROTONIX) 40 MG tablet Take 1 tablet (40 mg total) by mouth daily. 30 tablet 5  . PREMARIN vaginal cream ONE-HALF APPLICATOR 2 TIMES A WEEK AS DIRECTED  0  . promethazine (PHENERGAN) 12.5 MG tablet take 1 tablet by mouth every 6 hours if needed for nausea and vomiting 30 tablet 0  . traMADol-acetaminophen (ULTRACET) 37.5-325 MG tablet Take 1 tablet by mouth every 6 (six) hours.  0  . triamcinolone cream (KENALOG) 0.1 % apply to affected area twice a day  0  . verapamil (CALAN-SR) 240 MG CR tablet take 1 tablet by mouth once daily 90 tablet 1  . vitamin C (ASCORBIC ACID) 500 MG tablet Take 500 mg by mouth daily.     No current facility-administered medications on file prior to visit.    Social History   Socioeconomic History  . Marital status: Single    Spouse name: Not on file  . Number of children: 1  . Years of education: Not on file  . Highest education level: Not on file  Social Needs  . Financial resource strain: Not on file  . Food insecurity - worry: Not on file  . Food insecurity - inability: Not on file  . Transportation needs - medical: Not on file  . Transportation needs - non-medical: Not on file  Occupational History    Employer: RETIRED  Tobacco Use  . Smoking status: Never Smoker  . Smokeless tobacco: Never Used  Substance and Sexual Activity  . Alcohol use: No    Alcohol/week: 0.0 oz  . Drug use: No  . Sexual activity: No    Birth control/protection: Post-menopausal  Other Topics Concern  . Not on file  Social History Narrative   Marital status: divorced.      Children: 1 son; no grandchildren      Lives: with son      Employment: retired      Tobacco: none      Alcohol: none      Drugs: none       Exercise: none       ADLs:  Independent with ADLs.  Quit driving age 43.  Medical CNA cleans house; makes breakfast;  patient pays bills.      Advanced Directives:   YES.  HCPOA:  Son/Isaac Bowman.  FULL CODE; no prolonged measures.   Family History  Problem Relation Age of Onset  . Breast cancer Mother   . Heart attack Mother   . Heart disease Father   . Alcohol abuse Son   . Colon cancer Neg Hx   . Stomach cancer Neg Hx   . Rectal cancer Neg Hx        Objective:    BP 138/72   Pulse 78   Temp 98 F (36.7 C) (Oral)   Resp 16   Wt 106 lb (48.1 kg)   SpO2 96%   BMI 21.95 kg/m  Physical Exam  Constitutional: She is oriented to person, place, and time. She appears well-developed and well-nourished. No distress.  HENT:  Head: Normocephalic and atraumatic.  Right Ear: External ear normal.  Left Ear: External ear normal.  Nose: Nose normal.  Mouth/Throat: Oropharynx is clear and moist.  Eyes: Conjunctivae and EOM are normal. Pupils are equal, round, and reactive to light.  Neck: Normal range of motion and full passive range of motion without pain. Neck supple. No JVD present. Carotid bruit is not present. No thyromegaly present.  Cardiovascular: Normal rate, regular rhythm and normal heart sounds. Exam reveals no gallop and no friction rub.  No murmur heard. Pulmonary/Chest: Effort normal and breath sounds normal. She has no wheezes. She has no rales. Right breast exhibits no inverted nipple, no mass, no nipple discharge, no skin change and no tenderness. Left breast exhibits no inverted nipple, no mass, no nipple discharge, no skin change and no tenderness. Breasts are symmetrical.  Abdominal: Soft. Bowel sounds are normal. She exhibits no distension and no mass. There is no tenderness. There is no rebound and no guarding.  Musculoskeletal:       Right shoulder: Normal.       Left shoulder: Normal.       Cervical back: Normal.  Lymphadenopathy:    She has no cervical adenopathy.  Neurological: She is alert and oriented to person, place, and time. She has normal reflexes. No cranial nerve  deficit. She exhibits normal muscle tone. Coordination normal.  Skin: Skin is warm and dry. No rash noted. She is not diaphoretic. No erythema. No pallor.  Psychiatric: She has a normal mood and affect. Her behavior is normal. Judgment and thought content normal.  Nursing note and vitals reviewed.  No results found. Depression screen [facility] 2/9 12/10/2017 11/21/2017 05/30/2017 11/04/2016 10/11/2016  Decreased Interest 0 0 0 0 0  Down, Depressed, Hopeless 0 0 0 0 0  PHQ - 2 Score  0 0 0 0 0  Some recent data might be hidden   Fall Risk  12/10/2017 11/21/2017 05/30/2017 11/04/2016 10/11/2016  Falls in the past year? No No No No No  Number falls in past yr: - - - - -  Injury with Fall? - - - - -   Functional Status Survey: Is the patient deaf or have difficulty hearing?: No Does the patient have difficulty seeing, even when wearing glasses/contacts?: No Does the patient have difficulty concentrating, remembering, or making decisions?: No Does the patient have difficulty walking or climbing stairs?: No Does the patient have difficulty dressing or bathing?: Yes Does the patient have difficulty doing errands alone such as visiting a doctor's office or shopping?: Yes      Assessment & Plan:   1. Medicare annual wellness visit, subsequent   2. Essential hypertension, benign   3. Iron deficiency anemia secondary to inadequate dietary iron intake   4. Coronary artery disease involving native coronary artery of native heart without angina pectoris   5. Migraine without status migrainosus, not intractable, unspecified migraine type   6. Barrett's esophagus without dysplasia   7. Gastroesophageal reflux disease without esophagitis   8. Chronic gastric ulcer without hemorrhage and without perforation   9. Age-related osteoporosis without current pathological fracture   10. History of syphilis   11. HSV-2 (herpes simplex virus 2) infection   12. Degenerative disc disease, lumbar    -anticipatory  guidance provided --- exercise, weight loss, safe driving practices, aspirin 81mg  daily. -obtain age appropriate screening labs and labs for chronic disease management. -moderate fall risk; no evidence of depression; no evidence of hearing loss.  Discussed advanced directives and living will; also discussed end of life issues including code status.  -Hypertension well controlled.  Obtain labs.  Refills provided. -History of iron deficiency anemia.  Obtain iron levels and CBC. -Refill of Remeron provided for insomnia, decreased appetite, depression. -Degenerative disc disease of lumbar spine.  Refill of gabapentin and Flexeril provided.   Orders Placed This Encounter  Procedures  . CBC with Differential/Platelet  . Comprehensive metabolic panel  . Iron  . POCT urinalysis dipstick   Meds ordered this encounter  Medications  . mirtazapine (REMERON) 15 MG tablet    Sig: Take 1 tablet (15 mg total) by mouth at bedtime.    Dispense:  90 tablet    Refill:  3  . metoprolol succinate (TOPROL-XL) 50 MG 24 hr tablet    Sig: take 1 tablet by mouth once daily WITH OR IMMEDIATELY FOLLOWING A MEAL    Dispense:  90 tablet    Refill:  3  . hydrochlorothiazide (MICROZIDE) 12.5 MG capsule    Sig: take 1 capsule by mouth EVERY TUESDAY AND SATURDAY    Dispense:  90 capsule    Refill:  1  . gabapentin (NEURONTIN) 800 MG tablet    Sig: Take 1 tablet (800 mg total) by mouth at bedtime.    Dispense:  90 tablet    Refill:  1  . Zoster Vaccine Adjuvanted [facility]) injection    Sig: Inject 0.5 mLs into the muscle once for 1 dose.    Dispense:  0.5 mL    Refill:  1  . cyclobenzaprine (FLEXERIL) 5 MG tablet    Sig: TAKE 1/2 TO 1 TABLET BY MOUTH  UP TO TWICE DAILY AS NEEDED FOR MUSCLE SPASMS    Dispense:  20 tablet    Refill:  0    Return in about 4  months (around 04/10/2018) for follow-up chronic medical conditions.   Carreen Milius Elayne Guerin, M.D. Primary Care at [facility]  [place] [address] [place], [place]  [address] [phone] phone [phone] fax

## 2017-12-11 ENCOUNTER — Ambulatory Visit: Payer: Medicare Other | Admitting: Family Medicine

## 2017-12-11 LAB — CBC WITH DIFFERENTIAL/PLATELET
BASOS: 1 %
Basophils Absolute: 0 10*3/uL (ref 0.0–0.2)
EOS (ABSOLUTE): 0.4 10*3/uL (ref 0.0–0.4)
Eos: 7 %
HEMOGLOBIN: 11.6 g/dL (ref 11.1–15.9)
Hematocrit: 36.7 % (ref 34.0–46.6)
Immature Grans (Abs): 0 10*3/uL (ref 0.0–0.1)
Immature Granulocytes: 0 %
LYMPHS ABS: 0.9 10*3/uL (ref 0.7–3.1)
Lymphs: 16 %
MCH: 27.6 pg (ref 26.6–33.0)
MCHC: 31.6 g/dL (ref 31.5–35.7)
MCV: 87 fL (ref 79–97)
MONOS ABS: 0.3 10*3/uL (ref 0.1–0.9)
Monocytes: 6 %
NEUTROS ABS: 4 10*3/uL (ref 1.4–7.0)
NEUTROS PCT: 70 %
PLATELETS: 388 10*3/uL — AB (ref 150–379)
RBC: 4.21 x10E6/uL (ref 3.77–5.28)
RDW: 15.4 % (ref 12.3–15.4)
WBC: 5.6 10*3/uL (ref 3.4–10.8)

## 2017-12-11 LAB — COMPREHENSIVE METABOLIC PANEL
A/G RATIO: 1.4 (ref 1.2–2.2)
ALT: 11 IU/L (ref 0–32)
AST: 20 IU/L (ref 0–40)
Albumin: 3.9 g/dL (ref 3.5–4.7)
Alkaline Phosphatase: 56 IU/L (ref 39–117)
BILIRUBIN TOTAL: 0.3 mg/dL (ref 0.0–1.2)
BUN/Creatinine Ratio: 18 (ref 12–28)
BUN: 16 mg/dL (ref 8–27)
CALCIUM: 9.6 mg/dL (ref 8.7–10.3)
CHLORIDE: 102 mmol/L (ref 96–106)
CO2: 28 mmol/L (ref 20–29)
Creatinine, Ser: 0.88 mg/dL (ref 0.57–1.00)
GFR calc Af Amer: 71 mL/min/{1.73_m2} (ref >59)
GFR, EST NON AFRICAN AMERICAN: 61 mL/min/{1.73_m2} (ref >59)
Globulin, Total: 2.7 g/dL (ref 1.5–4.5)
Glucose: 65 mg/dL (ref 65–99)
POTASSIUM: 4.6 mmol/L (ref 3.5–5.2)
Sodium: 144 mmol/L (ref 134–144)
Total Protein: 6.6 g/dL (ref 6.0–8.5)

## 2017-12-11 LAB — IRON: IRON: 100 ug/dL (ref 27–139)

## 2017-12-11 NOTE — Telephone Encounter (Signed)
Reviewed results with patient at appointment 12/10/17.

## 2017-12-13 ENCOUNTER — Telehealth: Payer: Self-pay | Admitting: Family Medicine

## 2017-12-13 NOTE — Telephone Encounter (Signed)
Copied from [place] [phone]. Topic: Referral - Question >> Dec 13, 2017  8:33 AM Robina Ade, Helene Kelp D wrote: Reason for CRM: Patient would like to talk to someone about her referral appt with Kae Heller. She would like to cancel appt because she is claustrophobic and is scared. Please call patient back, thanks.

## 2017-12-13 NOTE — Telephone Encounter (Signed)
Spoke with pt.  Advised she call the office for [name] and inquire if a hearing test can be done any other way than in the small enclosed booth - pt cannot even go inside an elevator due to claustrophobia.  Pt verbalized understanding.

## 2017-12-14 NOTE — Telephone Encounter (Unsigned)
Copied from [place]. Topic: Inquiry >> Dec 13, 2017 12:18 PM [name] wrote: Reason for CRM: Pt is nervous over a hearing test coming up on 03/18/18  because she doesn't like being in closed areas and wants to know what she can do to overcome her anxiety .

## 2017-12-21 NOTE — Telephone Encounter (Signed)
I called pt and she stated never mind because she isn't going to do it anyway due to her claustrophobia

## 2017-12-21 NOTE — Telephone Encounter (Signed)
Please have patient call [facility] office to see if there are any other options.

## 2017-12-28 ENCOUNTER — Other Ambulatory Visit: Payer: Self-pay | Admitting: Family Medicine

## 2017-12-28 ENCOUNTER — Telehealth: Payer: Self-pay | Admitting: Family Medicine

## 2017-12-28 NOTE — Telephone Encounter (Signed)
Copied from [place]. Topic: Quick Communication - Rx Refill/Question >> Dec 28, 2017  5:16 PM Tye [place] wrote: Pharmacy has sent over a request for hydrocortisone 2.5 % ointment [[id number]] for the pt, contact pharmacy or pt if needed

## 2018-01-01 ENCOUNTER — Telehealth: Payer: Self-pay

## 2018-01-01 DIAGNOSIS — M5136 Other intervertebral disc degeneration, lumbar region: Secondary | ICD-10-CM | POA: Insufficient documentation

## 2018-01-01 NOTE — Telephone Encounter (Signed)
Pt called and pt stated she already received shingles vacc at the pharmacy.  Copied from [place] [phone]. Topic: General - Other >> Jan 01, 2018 11:25 AM Yvette Rack wrote: Reason for CRM: patient calling to see if she need a shingles shot she think she had one long time ago

## 2018-01-01 NOTE — Telephone Encounter (Signed)
Phone call to patient to ask why she stopped taking mirtazapine. She states it causes stomach upset. She took medication for a month, cannot remember when she stopped taking it but states that it was more than a month ago.  She is eating two meals and a snack. Stays up to watch news at 11pm. Wakes up at 9:30. Naps during the day. Drinks boost shakes.   Patient states she wants Dr. Tamala Julian to tell her if she should be taking this medication or not, she feels fine without it.   Provider, please advise.

## 2018-01-01 NOTE — Telephone Encounter (Signed)
Copied from [place] [id number]. Topic: General - Other >> Jan 01, 2018  1:36 PM Carolyn Stare wrote:   Pt said she is not taking the following med because it make her sick   mirtazapine (REMERON) 15 MG tablet  Would like a call back [phone]

## 2018-01-02 ENCOUNTER — Telehealth: Payer: Self-pay | Admitting: Family Medicine

## 2018-01-02 NOTE — Telephone Encounter (Signed)
Called in questioning her dose of calcium and vitamin D.   I went over her medications and doses with her from her medications list.    She was wondering if she should get a shingles shot. I routed a note to Dr. Thompson Caul nurse pool regarding the shingles shot.

## 2018-01-03 NOTE — Telephone Encounter (Signed)
Called pt this morning about questions about shingles vaccine and was informed that pt was unavailable so informed the person ( her Son)  to have her call us back at the office. He verbalized understanding.

## 2018-01-04 NOTE — Telephone Encounter (Signed)
Call --- fine to not restart Mirtazipine since she is doing well without it and since weight is stable.

## 2018-01-04 NOTE — Telephone Encounter (Signed)
Spoke to pt.  She received Zoster vaccine  01/02/2018 at [facility] Pt aware to return to [facility] in 2 mo for follow up inj.

## 2018-01-07 NOTE — Telephone Encounter (Signed)
L/m for patient to call back

## 2018-01-09 ENCOUNTER — Encounter: Payer: Self-pay | Admitting: Family Medicine

## 2018-01-14 ENCOUNTER — Ambulatory Visit: Payer: Medicare Other | Admitting: Podiatry

## 2018-01-28 ENCOUNTER — Ambulatory Visit: Payer: Medicare Other | Admitting: Podiatry

## 2018-01-28 ENCOUNTER — Telehealth: Payer: Self-pay

## 2018-01-28 NOTE — Telephone Encounter (Signed)
Phone call to patient. She states her purpose of call clinic is she has a question for Dr. Tamala Julian- "If my labs look good, why am I supposed to take the medicine?" Asked patient to clarify, she states this is regarding the calcium she is taking- if her calcium is normal, why is she supposed to take over the counter calcium? Provider, please advise.

## 2018-01-28 NOTE — Telephone Encounter (Signed)
Copied from [place] [id number]. Topic: Quick Communication - See Telephone Encounter >> Jan 25, 2018  2:24 PM Vernona Rieger wrote: CRM for notification. See Telephone encounter for:   01/25/18.  Patient states she received a letter in the mail today regarding her recent visit. She said she is confused & wants a nurse to call her back in regards to this [phone]

## 2018-01-29 NOTE — Telephone Encounter (Signed)
Pt notified and verbalized understanding.

## 2018-01-29 NOTE — Telephone Encounter (Signed)
Her labs are controlled on current medications.  Thus, she needs to continue medications to maintain normal lab values.  She needs to take additional calcium for her osteopenia even though calcium is normal

## 2018-02-06 ENCOUNTER — Ambulatory Visit: Payer: Medicare Other | Admitting: Podiatry

## 2018-02-07 DIAGNOSIS — Z961 Presence of intraocular lens: Secondary | ICD-10-CM | POA: Diagnosis not present

## 2018-02-07 DIAGNOSIS — H401111 Primary open-angle glaucoma, right eye, mild stage: Secondary | ICD-10-CM | POA: Diagnosis not present

## 2018-02-07 DIAGNOSIS — H401122 Primary open-angle glaucoma, left eye, moderate stage: Secondary | ICD-10-CM | POA: Diagnosis not present

## 2018-02-07 DIAGNOSIS — H04123 Dry eye syndrome of bilateral lacrimal glands: Secondary | ICD-10-CM | POA: Diagnosis not present

## 2018-02-07 DIAGNOSIS — H10413 Chronic giant papillary conjunctivitis, bilateral: Secondary | ICD-10-CM | POA: Diagnosis not present

## 2018-02-11 ENCOUNTER — Ambulatory Visit: Payer: Medicare Other | Admitting: Podiatry

## 2018-02-18 ENCOUNTER — Ambulatory Visit (INDEPENDENT_AMBULATORY_CARE_PROVIDER_SITE_OTHER): Payer: Medicare Other | Admitting: Podiatry

## 2018-02-18 DIAGNOSIS — M79676 Pain in unspecified toe(s): Secondary | ICD-10-CM | POA: Diagnosis not present

## 2018-02-18 DIAGNOSIS — B351 Tinea unguium: Secondary | ICD-10-CM

## 2018-02-18 DIAGNOSIS — L84 Corns and callosities: Secondary | ICD-10-CM | POA: Diagnosis not present

## 2018-02-20 NOTE — Progress Notes (Signed)
    Subjective: Patient is a 84 y.o. female presenting to the office today with a chief complaint of painful callus lesions to the bilateral feet that have been ongoing for the past several months. Applying pressure to the areas increase the pain. She has not done anything at home to treat the symptoms.  Patient also complains of elongated, thickened nails that cause pain while ambulating in shoes. She is unable to trim her own nails. Patient presents today for further treatment and evaluation.    Past Medical History:  Diagnosis Date  . Abnormal finding on Pap smear, ASCUS   . Acute renal insufficiency   . Amputation finger    index  . Barrett's esophagus   . CAD (coronary artery disease)   . Dementia   . Diverticulosis   . Elevated BP   . Foot deformity   . GERD (gastroesophageal reflux disease)   . Glaucoma   . H/O: GI bleed   . HSV-2 (herpes simplex virus 2) infection   . Hyperglycemia   . Hypertension   . Migraine   . Osteoporosis    worsening  . Spondylosis     Objective:  Physical Exam General: Alert and oriented x3 in no acute distress  Dermatology: Hyperkeratotic lesions present on the bilateral feet x 3. Pain on palpation with a central nucleated core noted. Skin is warm, dry and supple bilateral lower extremities. Negative for open lesions or macerations. Nails are tender, long, thickened and dystrophic with subungual debris, consistent with onychomycosis, 1-5 bilateral. No signs of infection noted.  Vascular: Palpable pedal pulses bilaterally. No edema or erythema noted. Capillary refill within normal limits.  Neurological: Epicritic and protective threshold grossly intact bilaterally.   Musculoskeletal Exam: Pain on palpation at the keratotic lesion noted. Range of motion within normal limits bilateral. Muscle strength 5/5 in all groups bilateral.  Assessment: 1. Onychodystrophic nails 1-5 bilateral with hyperkeratosis of nails.  2. Onychomycosis of nail due  to dermatophyte bilateral 3. Pre-ulcerative callus lesions to the bilateral feet x 3   Plan of Care:  #1 Patient evaluated. #2 Excisional debridement of keratoic lesion using a chisel blade was performed without incident.  #3 Dressed with light dressing. #4 Mechanical debridement of nails 1-5 bilaterally performed using a nail nipper. Filed with dremel without incident.  #5 Patient is to return to the clinic in 6 months.   Edrick Kins, DPM [facility]  Dr. Edrick Kins, [place]                                        [place], [place] [address]                Office [phone]  Fax [phone]

## 2018-03-12 ENCOUNTER — Other Ambulatory Visit: Payer: Self-pay | Admitting: Family Medicine

## 2018-03-12 NOTE — Telephone Encounter (Signed)
Hydrocortisone ointment refill request  LOV 12/10/17 with Dr. Leota Jacobsen [phone] [facility] [place], [place]  [address].

## 2018-03-13 ENCOUNTER — Ambulatory Visit: Payer: Medicare Other | Admitting: Audiology

## 2018-03-18 ENCOUNTER — Ambulatory Visit: Payer: Medicare Other | Admitting: Audiology

## 2018-03-18 ENCOUNTER — Emergency Department (HOSPITAL_COMMUNITY): Payer: Medicare Other

## 2018-03-18 ENCOUNTER — Encounter (HOSPITAL_COMMUNITY): Payer: Self-pay | Admitting: Emergency Medicine

## 2018-03-18 ENCOUNTER — Emergency Department (HOSPITAL_COMMUNITY)
Admission: EM | Admit: 2018-03-18 | Discharge: 2018-03-18 | Disposition: A | Discharge status: Home / Self Care | Payer: Medicare Other | Attending: Emergency Medicine | Admitting: Emergency Medicine

## 2018-03-18 DIAGNOSIS — I10 Essential (primary) hypertension: Secondary | ICD-10-CM | POA: Diagnosis not present

## 2018-03-18 DIAGNOSIS — G8929 Other chronic pain: Secondary | ICD-10-CM | POA: Insufficient documentation

## 2018-03-18 DIAGNOSIS — I2510 Atherosclerotic heart disease of native coronary artery without angina pectoris: Secondary | ICD-10-CM | POA: Diagnosis not present

## 2018-03-18 DIAGNOSIS — Z7982 Long term (current) use of aspirin: Secondary | ICD-10-CM | POA: Insufficient documentation

## 2018-03-18 DIAGNOSIS — S199XXA Unspecified injury of neck, initial encounter: Secondary | ICD-10-CM | POA: Diagnosis not present

## 2018-03-18 DIAGNOSIS — S0990XA Unspecified injury of head, initial encounter: Secondary | ICD-10-CM | POA: Diagnosis not present

## 2018-03-18 DIAGNOSIS — M542 Cervicalgia: Secondary | ICD-10-CM | POA: Insufficient documentation

## 2018-03-18 DIAGNOSIS — Z79899 Other long term (current) drug therapy: Secondary | ICD-10-CM | POA: Diagnosis not present

## 2018-03-18 DIAGNOSIS — R51 Headache: Secondary | ICD-10-CM | POA: Insufficient documentation

## 2018-03-18 NOTE — ED Notes (Signed)
Pt returned from CT, nad 

## 2018-03-18 NOTE — ED Triage Notes (Signed)
Pt reports for the last two days she has a pain I the back of her head that goes into her neck and right side of her neck, pt states she feels unable to move her neck. Pt states she took a "pain pill" this am and feels better but still feels she needs "Ct scan". Pt lives her her son, has had no trouble walking with cane at home.

## 2018-03-18 NOTE — Discharge Instructions (Addendum)
You have some arthritis in your neck.  Follow-up with neurosurgery for it.  There also is potentially a nodule on her thyroid that will need further follow-up by her primary care doctor.

## 2018-03-18 NOTE — ED Provider Notes (Signed)
[facility] EMERGENCY DEPARTMENT Provider Note   CSN: [id number] Arrival date & time: 03/18/18  1058     History   Chief Complaint Chief Complaint  Patient presents with  . Headache    HPI [name] is a 84 y.o. female.  HPI Patient presents with neck pain and headache.  Has had for the last couple days.  No fall.  States she has difficulty sleeping.  States she has trouble turning her head to the right and is at the side that God talks to her at.  No numbness or weakness.  Pain is dull.  Worse with movements.  Has had a history of chronic neck pain.  States she has been on Neurontin for it in the past.  States she cannot take some of the medicine she used to because it will give her an ulcer.  States she almost had an ulcer one time. Past Medical History:  Diagnosis Date  . Abnormal finding on Pap smear, ASCUS   . Acute renal insufficiency   . Amputation finger    index  . Barrett's esophagus   . CAD (coronary artery disease)   . Dementia   . Diverticulosis   . Elevated BP   . Foot deformity   . GERD (gastroesophageal reflux disease)   . Glaucoma   . H/O: GI bleed   . HSV-2 (herpes simplex virus 2) infection   . Hyperglycemia   . Hypertension   . Migraine   . Osteoporosis    worsening  . Spondylosis     Patient Active Problem List   Diagnosis Date Noted  . Degenerative disc disease, lumbar 01/01/2018  . Hiatal hernia 11/04/2016  . Acute blood loss anemia   . Gastroesophageal reflux disease without esophagitis 07/31/2014  . Gastric ulcer without hemorrhage or perforation 07/22/2013  . History of syphilis 02/27/2012  . Dementia   . Spondylosis   . Foot deformity   . Migraine   . CAD (coronary artery disease)   . HSV-2 (herpes simplex virus 2) infection   . Abnormal finding on Pap smear, ASCUS   . Osteoporosis   . CONSTIPATION 12/12/2010  . ANEMIA, IRON DEFICIENCY 06/17/2008  . BARRETTS ESOPHAGUS 06/17/2008  . GLAUCOMA 06/16/2008  .  Essential hypertension 06/16/2008  . DIVERTICULOSIS, COLON 06/16/2008  . ARTHRITIS 06/16/2008  . SLEEP APNEA 06/16/2008    Past Surgical History:  Procedure Laterality Date  . AMPUTATION FINGER / THUMB     secondary to osteomyelitis  . CATARACT EXTRACTION, BILATERAL Bilateral 12/25/2016  . COLONOSCOPY    . ESOPHAGOGASTRODUODENOSCOPY N/A 10/22/2016   Procedure: ESOPHAGOGASTRODUODENOSCOPY (EGD);  Surgeon: Doran Stabler, MD;  Location: [facility] ENDOSCOPY;  Service: Endoscopy;  Laterality: N/A;  . FOOT SURGERY     right  . PARTIAL HYSTERECTOMY    . UPPER GASTROINTESTINAL ENDOSCOPY       OB History   None      Home Medications    Prior to Admission medications   Medication Sig Start Date End Date Taking? Authorizing Provider  aspirin 81 MG tablet Take 81 mg by mouth every morning.     [provider]  Calcium Carbonate-Vitamin D (CALCIUM-VITAMIN D) 500-200 MG-UNIT tablet Take 3 tablets by mouth every morning.    [provider]  cyclobenzaprine (FLEXERIL) 5 MG tablet TAKE 1/2 TO 1 TABLET BY MOUTH  UP TO TWICE DAILY AS NEEDED FOR MUSCLE SPASMS 12/10/17   Wardell Honour, MD  gabapentin (NEURONTIN) 800 MG tablet  Take 1 tablet (800 mg total) by mouth at bedtime. 12/10/17   Wardell Honour, MD  hydrochlorothiazide (MICROZIDE) 12.5 MG capsule take 1 capsule by mouth EVERY TUESDAY AND SATURDAY 12/10/17   Wardell Honour, MD  hydrocortisone 2.5 % ointment APPLY TO AFFECTED AREA TWICE A DAY 03/12/18   Wardell Honour, MD  hydroxypropyl methylcellulose (ISOPTO TEARS) 2.5 % ophthalmic solution Place 1 drop into both eyes 2 (two) times daily as needed for dry eyes.     [provider]  lactose free nutrition (BOOST) LIQD Take 237 mLs by mouth 2 (two) times daily between meals. Patient taking differently: Take 1 Container by mouth 2 (two) times daily as needed (nutritional).  03/22/16   [name], MD  metoprolol succinate (TOPROL-XL) 50 MG 24 hr tablet take 1 tablet  by mouth once daily WITH OR IMMEDIATELY FOLLOWING A MEAL 12/10/17   Wardell Honour, MD  Multiple Vitamins-Minerals (HAIR/SKIN/NAILS PO) Take 1 tablet by mouth every morning.     [provider]  pantoprazole (PROTONIX) 40 MG tablet Take 1 tablet (40 mg total) by mouth daily. 09/26/17   Armbruster, Carlota Raspberry, MD  PREMARIN vaginal cream ONE-HALF APPLICATOR 2 TIMES A WEEK AS DIRECTED 11/09/17   [provider]  promethazine (PHENERGAN) 12.5 MG tablet take 1 tablet by mouth every 6 hours if needed for nausea and vomiting 11/26/17   Armbruster, Carlota Raspberry, MD  traMADol-acetaminophen (ULTRACET) 37.5-325 MG tablet Take 1 tablet by mouth every 6 (six) hours. 10/25/17   [provider]  triamcinolone cream (KENALOG) 0.1 % apply to affected area twice a day 09/24/17   [provider]  verapamil (CALAN-SR) 240 MG CR tablet take 1 tablet by mouth once daily 11/21/17   Wardell Honour, MD  vitamin C (ASCORBIC ACID) 500 MG tablet Take 500 mg by mouth daily.    [provider]    Family History Family History  Problem Relation Age of Onset  . Breast cancer Mother   . Heart attack Mother   . Heart disease Father   . Alcohol abuse Son   . Colon cancer Neg Hx   . Stomach cancer Neg Hx   . Rectal cancer Neg Hx     Social History Social History   Tobacco Use  . Smoking status: Never Smoker  . Smokeless tobacco: Never Used  Substance Use Topics  . Alcohol use: No    Alcohol/week: 0.0 oz  . Drug use: No     Allergies   Patient has no known allergies.   Review of Systems Review of Systems  Constitutional: Negative for appetite change.  HENT: Negative for congestion.   Respiratory: Negative for shortness of breath.   Cardiovascular: Negative for chest pain.  Gastrointestinal: Negative for abdominal pain.  Genitourinary: Negative for flank pain.  Musculoskeletal: Positive for neck pain. Negative for back pain.  Neurological: Positive for headaches.    Psychiatric/Behavioral: Negative for confusion.     Physical Exam Updated Vital Signs BP 132/65   Pulse 63   Temp 98.3 F (36.8 C) (Oral)   Resp 18   SpO2 99%   Physical Exam  Constitutional: She is oriented to person, place, and time. She appears well-developed.  HENT:  Head: Atraumatic.  Eyes: Pupils are equal, round, and reactive to light.  Neck: Normal range of motion.  Tenderness over top of cervical spine.  No deformity.  No crepitance.  Cardiovascular: Normal rate.  Pulmonary/Chest: Effort normal.  Abdominal: Soft.  Neurological: She is alert and oriented to person, place, and time.  Skin: Skin is warm. Capillary refill takes less than 2 seconds.  Psychiatric: She has a normal mood and affect.     ED Treatments / Results  Labs (all labs ordered are listed, but only abnormal results are displayed) Labs Reviewed - No data to display  EKG None  Radiology Ct Head Wo Contrast  Result Date: 03/18/2018 CLINICAL DATA:  Pain in the head and neck.  Dementia. EXAM: CT HEAD WITHOUT CONTRAST CT CERVICAL SPINE WITHOUT CONTRAST TECHNIQUE: Multidetector CT imaging of the head and cervical spine was performed following the standard protocol without intravenous contrast. Multiplanar CT image reconstructions of the cervical spine were also generated. COMPARISON:  10/15/2013 FINDINGS: CT HEAD FINDINGS Brain: The cerebellum, cerebral peduncles, thalami, basal ganglia, basilar cisterns, and ventricular system appear within normal limits. Slight deviation of the brainstem related to the basilar invagination. Periventricular white matter and corona radiata hypodensities favor chronic ischemic microvascular white matter disease. Chronic hypodensities in the external capsules as well. No intracranial hemorrhage, mass lesion, or acute CVA. Vascular: Vertebrobasilar atherosclerotic scotch that atherosclerotic calcifications noted. Skull: Basilar invagination.  No appreciable calvarial fracture.  Sinuses/Orbits: Unremarkable Other: No supplemental non-categorized findings. CT CERVICAL SPINE FINDINGS Alignment: 3 mm degenerative retrolisthesis at C3-4, stable. 2 mm degenerative retrolisthesis at C4-5 and C5-6. 2 mm degenerative anterolisthesis at C7-T1. Considerable dextroconvex cervical scoliosis. Skull base and vertebrae: Basilar invagination with prominent pseudoarticulation of the odontoid with the basion, and surrounding pannus formation or chondrocalcinosis similar to the prior exam. As before, there is a flattened and irregular right lateral mass of C1 with associated spurring and advanced arthropathy at the C1-2 articulation, right greater than left. Very thin right C1 lamina, as before. Somewhat widened intervertebral disc space at C2-3, as before, with disc calcifications. Notable loss of disc height at C3-4, C4-5, and C5-6, similar to the prior exam, with extensive endplate sclerosis and intervertebral spurring. Questionable partial fusion of the right lateral mass of C1 with the sclerotic body of C2. Soft tissues and spinal canal: Hypodense left thyroid nodule measuring 1.5 cm in long axis. Disc levels: Suspected mild osseous foraminal stenosis on the left at C4-5 and C5-6. Upper chest: Unremarkable Other: Bilateral carotid atherosclerotic calcification. IMPRESSION: 1. No acute intracranial findings. 2. Periventricular white matter and corona radiata hypodensities favor chronic ischemic microvascular white matter disease. 3. Atherosclerosis. 4. Prominent findings of basilar invagination, cervical scoliosis, spondylosis, and degenerative disc disease with suspected osseous foraminal impingement on the left at C4-5 and C5-6. However, these findings appear chronic. 5. Hypodense left thyroid nodule 1.5 cm in diameter. Consider further evaluation with thyroid ultrasound. If patient is clinically hyperthyroid, consider nuclear medicine thyroid uptake and scan. Electronically Signed   By: Van Clines M.D.   On: 03/18/2018 17:56   Ct Cervical Spine Wo Contrast  Result Date: 03/18/2018 CLINICAL DATA:  Pain in the head and neck.  Dementia. EXAM: CT HEAD WITHOUT CONTRAST CT CERVICAL SPINE WITHOUT CONTRAST TECHNIQUE: Multidetector CT imaging of the head and cervical spine was performed following the standard protocol without intravenous contrast. Multiplanar CT image reconstructions of the cervical spine were also generated. COMPARISON:  10/15/2013 FINDINGS: CT HEAD FINDINGS Brain: The cerebellum, cerebral peduncles, thalami, basal ganglia, basilar cisterns, and ventricular system appear within normal limits. Slight deviation of the brainstem related to the basilar invagination. Periventricular white matter and corona radiata hypodensities favor chronic ischemic microvascular white matter disease. Chronic hypodensities in the external capsules  as well. No intracranial hemorrhage, mass lesion, or acute CVA. Vascular: Vertebrobasilar atherosclerotic scotch that atherosclerotic calcifications noted. Skull: Basilar invagination.  No appreciable calvarial fracture. Sinuses/Orbits: Unremarkable Other: No supplemental non-categorized findings. CT CERVICAL SPINE FINDINGS Alignment: 3 mm degenerative retrolisthesis at C3-4, stable. 2 mm degenerative retrolisthesis at C4-5 and C5-6. 2 mm degenerative anterolisthesis at C7-T1. Considerable dextroconvex cervical scoliosis. Skull base and vertebrae: Basilar invagination with prominent pseudoarticulation of the odontoid with the basion, and surrounding pannus formation or chondrocalcinosis similar to the prior exam. As before, there is a flattened and irregular right lateral mass of C1 with associated spurring and advanced arthropathy at the C1-2 articulation, right greater than left. Very thin right C1 lamina, as before. Somewhat widened intervertebral disc space at C2-3, as before, with disc calcifications. Notable loss of disc height at C3-4, C4-5, and C5-6,  similar to the prior exam, with extensive endplate sclerosis and intervertebral spurring. Questionable partial fusion of the right lateral mass of C1 with the sclerotic body of C2. Soft tissues and spinal canal: Hypodense left thyroid nodule measuring 1.5 cm in long axis. Disc levels: Suspected mild osseous foraminal stenosis on the left at C4-5 and C5-6. Upper chest: Unremarkable Other: Bilateral carotid atherosclerotic calcification. IMPRESSION: 1. No acute intracranial findings. 2. Periventricular white matter and corona radiata hypodensities favor chronic ischemic microvascular white matter disease. 3. Atherosclerosis. 4. Prominent findings of basilar invagination, cervical scoliosis, spondylosis, and degenerative disc disease with suspected osseous foraminal impingement on the left at C4-5 and C5-6. However, these findings appear chronic. 5. Hypodense left thyroid nodule 1.5 cm in diameter. Consider further evaluation with thyroid ultrasound. If patient is clinically hyperthyroid, consider nuclear medicine thyroid uptake and scan. Electronically Signed   By: Van Clines M.D.   On: 03/18/2018 17:56    Procedures Procedures (including critical care time)  Medications Ordered in ED Medications - No data to display   Initial Impression / Assessment and Plan / ED Course  I have reviewed the triage vital signs and the nursing notes.  Pertinent labs & imaging results that were available during my care of the patient were reviewed by me and considered in my medical decision making (see chart for details).     Patient awake and pleasant.  Headache and upper neck pain.  CT scan done and reassuring.  Has some abnormalities but is stable.  Also has thyroid nodules will need to be followed.  Will discharge home.  Final Clinical Impressions(s) / ED Diagnoses   Final diagnoses:  Neck pain    ED Discharge Orders    None       Davonna Belling, MD 03/18/18 2211

## 2018-03-18 NOTE — ED Notes (Signed)
Ear Wax removal performed and completed.

## 2018-03-21 DIAGNOSIS — M542 Cervicalgia: Secondary | ICD-10-CM | POA: Diagnosis not present

## 2018-03-21 DIAGNOSIS — Z79891 Long term (current) use of opiate analgesic: Secondary | ICD-10-CM | POA: Diagnosis not present

## 2018-03-21 DIAGNOSIS — M545 Low back pain: Secondary | ICD-10-CM | POA: Diagnosis not present

## 2018-03-25 ENCOUNTER — Ambulatory Visit: Payer: Self-pay

## 2018-03-25 NOTE — Telephone Encounter (Signed)
Phone call from pt. to report constipation.  Stated she has not had BM for 4 days.  Reported she took one dose of Miralax, and then had frequent loose stools, then took Imodium.  Reported since taking several Imodium, she has not been able to have a stool.  C/o some pain with pressing on the abdomen.  Also stated "I always have bloating; it comes with my age."  Stated abdomen is soft and not hard.  Is passing gas.  Reported intermittent nausea.  Reported she vomited x 1 yesterday, when she drank milk.  Stated she has not changed her diet, or started any new medications.  Reported she does not drink much water.  Reported she doesn't have much appetite, and this is her usual.  Care advice given per protocol; explained importance of water intake and of foods high in fiber.  Advised to try Prune juice, since she had such loose stools with one dose of Miralax.  Also, recommended to continue to take Prune juice on regular basis, if this is helpful to relieve her constipation. Encouraged to call back if no results with Prune juice, or with worsening symptoms.  Pt. Verb. Understanding, and agreed with plan.      Reason for Disposition . Mild constipation  Answer Assessment - Initial Assessment Questions 1. STOOL PATTERN OR FREQUENCY: "How often do you pass bowel movements (BMs)?"  (Normal range: tid to q 3 days)  "When was the last BM passed?"       everyday 2. STRAINING: "Do you have to strain to have a BM?"      Does a digital evacuation at times to initiate BM.    3. RECTAL PAIN: "Does your rectum hurt when the stool comes out?" If so, ask: "Do you have hemorrhoids? How bad is the pain?"  (Scale 1-10; or mild, moderate, severe)     Denied rectal pain 4. STOOL COMPOSITION: "Are the stools hard?"      Hasn't had BM in about 4 days 5. BLOOD ON STOOLS: "Has there been any blood on the toilet tissue or on the surface of the BM?" If so, ask: "When was the last time?"      no 6. CHRONIC CONSTIPATION: "Is this a  new problem for you?"  If no, ask: How long have you had this problem?" (days, weeks, months)      Not usually 7. CHANGES IN DIET: "Have there been any recent changes in your diet?"      Eating her regular diet; reported she doesn't eat much 8. MEDICATIONS: "Have you been taking any new medications?"     no 9. LAXATIVES: "Have you been using any laxatives or enemas?"  If yes, ask "What, how often, and when was the last time?"    Was using Miralax until became stools very loose;   10. CAUSE: "What do you think is causing the constipation?"        Took Immodium about 5 days ago. 11. OTHER SYMPTOMS: "Do you have any other symptoms?" (e.g., abdominal pain, fever, vomiting)       Some abd. Bloating; Nauseated @ times; vomited x 1 yesterday;  feels pain in abdomen when presses on it; c/o feeling cold at times   12. PREGNANCY: "Is there any chance you are pregnant?" "When was your last menstrual period?"       n/a  Protocols used: CONSTIPATION-A-AH

## 2018-03-31 ENCOUNTER — Other Ambulatory Visit: Payer: Self-pay | Admitting: Gastroenterology

## 2018-04-08 DIAGNOSIS — I10 Essential (primary) hypertension: Secondary | ICD-10-CM | POA: Diagnosis not present

## 2018-04-08 DIAGNOSIS — M542 Cervicalgia: Secondary | ICD-10-CM | POA: Diagnosis not present

## 2018-04-10 ENCOUNTER — Ambulatory Visit: Payer: Medicare Other | Admitting: Family Medicine

## 2018-04-11 ENCOUNTER — Telehealth: Payer: Self-pay | Admitting: Family Medicine

## 2018-04-11 NOTE — Telephone Encounter (Signed)
Provider, see results from 03/18/2018 ED visit. Is Excedrin OK to take? What other over the counter medications are safe for patient? Please advise.

## 2018-04-11 NOTE — Telephone Encounter (Signed)
Copied from [place] [id number]. Topic: Quick Communication - See Telephone Encounter >> Apr 11, 2018 12:57 PM Rutherford Nail, NT wrote: CRM for notification. See Telephone encounter for: 04/11/18. Patient calling and is requesting a call back from a nurse to discuss the results from her ER visit, primarily the nodule found on the CT. She has an appointment on Monday 04/15/18, but would like to discuss this further. She is very worried about the nodule and the medication that the doctor told her she could take (Excedrin). States that she has liver issues and does not want the medication to further damage her liver. Requesting a call back today before 5pm. CB#: [phone]

## 2018-04-15 ENCOUNTER — Ambulatory Visit: Payer: Medicare Other | Admitting: Family Medicine

## 2018-04-15 ENCOUNTER — Ambulatory Visit (INDEPENDENT_AMBULATORY_CARE_PROVIDER_SITE_OTHER): Payer: Medicare Other | Admitting: Family Medicine

## 2018-04-15 ENCOUNTER — Other Ambulatory Visit: Payer: Self-pay

## 2018-04-15 ENCOUNTER — Encounter: Payer: Self-pay | Admitting: Family Medicine

## 2018-04-15 VITALS — BP 158/70 | HR 95 | Temp 98.0°F | Resp 16 | Wt 108.0 lb

## 2018-04-15 DIAGNOSIS — K219 Gastro-esophageal reflux disease without esophagitis: Secondary | ICD-10-CM | POA: Diagnosis not present

## 2018-04-15 DIAGNOSIS — I2510 Atherosclerotic heart disease of native coronary artery without angina pectoris: Secondary | ICD-10-CM

## 2018-04-15 DIAGNOSIS — D508 Other iron deficiency anemias: Secondary | ICD-10-CM | POA: Diagnosis not present

## 2018-04-15 DIAGNOSIS — M5030 Other cervical disc degeneration, unspecified cervical region: Secondary | ICD-10-CM | POA: Diagnosis not present

## 2018-04-15 DIAGNOSIS — G44209 Tension-type headache, unspecified, not intractable: Secondary | ICD-10-CM | POA: Diagnosis not present

## 2018-04-15 DIAGNOSIS — R252 Cramp and spasm: Secondary | ICD-10-CM | POA: Diagnosis not present

## 2018-04-15 DIAGNOSIS — I10 Essential (primary) hypertension: Secondary | ICD-10-CM | POA: Diagnosis not present

## 2018-04-15 DIAGNOSIS — K2270 Barrett's esophagus without dysplasia: Secondary | ICD-10-CM | POA: Diagnosis not present

## 2018-04-15 DIAGNOSIS — E041 Nontoxic single thyroid nodule: Secondary | ICD-10-CM

## 2018-04-15 MED ORDER — VERAPAMIL HCL ER 240 MG PO TBCR: 240.0000 mg | EXTENDED_RELEASE_TABLET | Freq: Every day | ORAL | 1 refills | Status: DC

## 2018-04-15 MED ORDER — CYCLOBENZAPRINE HCL 5 MG PO TABS: ORAL_TABLET | ORAL | 0 refills | Status: DC

## 2018-04-15 NOTE — Patient Instructions (Addendum)
  Increase your water intake. Tak 1/2 muscle and 1 back pill for headache.   IF you received an x-ray today, you will receive an invoice from [facility]. Please contact [facility] at [phone] with questions or concerns regarding your invoice.   IF you received labwork today, you will receive an invoice from [facility]. Please contact [facility] at [phone] with questions or concerns regarding your invoice.   Our billing staff will not be able to assist you with questions regarding bills from these companies.  You will be contacted with the lab results as soon as they are available. The fastest way to get your results is to activate your My Chart account. Instructions are located on the last page of this paperwork. If you have not heard from [place] regarding the results in 2 weeks, please contact this office.

## 2018-04-15 NOTE — Progress Notes (Signed)
Subjective:    Patient ID: Temia Debroux, female    DOB: 1933-03-02, 84 y.o.   MRN: [id number]  04/15/2018  Chronic Conditions (4 month follow-up )    HPI This 84 y.o. female presents for four month follow-up of hypertension, hypercholesterolemia, GERD. Presented to ED on 03/18/18 for neck pain/headache.  Underwent CT cervical spine and CT head during ED visit; revealed DDD cervical spine C4-5 and C5-6 with concern for foraminal impingement yet findings chronic; microvascular white matter disease also present; LEFT thyroid nodule 1.5cm identified and thyroid US recommended. Taking migraine headaches excedrin. Dr. Nena Polio Nundkumar/NS; had headache severe; sharp knife on one side; CT cervical and CT head on 03/18/18.   Thyroid nodule found 1.5 LEFT thyroid. L arm is weak.  Neck hurts. SOB sometimes. +Nerves. +Stress. No chest pain; no palpitations; no orthopnea or leg swelling. Due to see hand surgeon. Appetite is not good; weight up two pounds.  No weight loss. Leg cramps.     BP Readings from Last 3 Encounters:  04/15/18 (!) 158/70  03/18/18 132/65  12/10/17 138/72   Wt Readings from Last 3 Encounters:  04/15/18 108 lb (49 kg)  12/10/17 106 lb (48.1 kg)  11/21/17 104 lb 9.6 oz (47.4 kg)   Immunization History  Administered Date(s) Administered  . Influenza Split 10/12/2011  . Influenza Whole 09/20/2012  . Influenza,inj,Quad PF,6+ Mos 10/07/2013, 09/03/2014, 08/26/2015, 10/11/2016  . Influenza-Unspecified 08/25/2017  . Pneumococcal Conjugate-13 07/21/2014  . Pneumococcal Polysaccharide-23 12/24/2001, 12/24/2006, 11/04/2016  . Tdap 07/15/2007, 04/28/2014  . Zoster 01/02/2018    Review of Systems  Constitutional: Positive for appetite change. Negative for activity change, chills, diaphoresis, fatigue, fever and unexpected weight change.  HENT: Negative for congestion, dental problem, drooling, ear discharge, ear pain, facial swelling, hearing loss, mouth sores, nosebleeds,  postnasal drip, rhinorrhea, sinus pressure, sneezing, sore throat, tinnitus, trouble swallowing and voice change.   Eyes: Negative for photophobia, pain, discharge, redness, itching and visual disturbance.  Respiratory: Negative for apnea, cough, choking, chest tightness, shortness of breath, wheezing and stridor.   Cardiovascular: Negative for chest pain, palpitations and leg swelling.  Gastrointestinal: Negative for abdominal distention, abdominal pain, anal bleeding, blood in stool, constipation, diarrhea, nausea, rectal pain and vomiting.  Endocrine: Negative for cold intolerance, heat intolerance, polydipsia, polyphagia and polyuria.  Genitourinary: Negative for decreased urine volume, difficulty urinating, dyspareunia, dysuria, enuresis, flank pain, frequency, genital sores, hematuria, menstrual problem, pelvic pain, urgency, vaginal bleeding, vaginal discharge and vaginal pain.  Musculoskeletal: Positive for myalgias, neck pain and neck stiffness. Negative for arthralgias, back pain, gait problem and joint swelling.  Skin: Negative for color change, pallor, rash and wound.  Allergic/Immunologic: Negative for environmental allergies, food allergies and immunocompromised state.  Neurological: Positive for headaches. Negative for dizziness, tremors, seizures, syncope, facial asymmetry, speech difficulty, weakness, light-headedness and numbness.  Hematological: Negative for adenopathy. Does not bruise/bleed easily.  Psychiatric/Behavioral: Negative for agitation, behavioral problems, confusion, decreased concentration, dysphoric mood, hallucinations, self-injury, sleep disturbance and suicidal ideas. The patient is nervous/anxious. The patient is not hyperactive.     Past Medical History:  Diagnosis Date  . Abnormal finding on Pap smear, ASCUS   . Acute renal insufficiency   . Amputation finger    index  . Barrett's esophagus   . CAD (coronary artery disease)   . Dementia   .  Diverticulosis   . Elevated BP   . Foot deformity   . GERD (gastroesophageal reflux disease)   . Glaucoma   . H/O:  GI bleed   . HSV-2 (herpes simplex virus 2) infection   . Hyperglycemia   . Hypertension   . Migraine   . Osteoporosis    worsening  . Spondylosis    Past Surgical History:  Procedure Laterality Date  . AMPUTATION FINGER / THUMB     secondary to osteomyelitis  . CATARACT EXTRACTION, BILATERAL Bilateral 12/25/2016  . COLONOSCOPY    . ESOPHAGOGASTRODUODENOSCOPY N/A 10/22/2016   Procedure: ESOPHAGOGASTRODUODENOSCOPY (EGD);  Surgeon: Doran Stabler, MD;  Location: [facility] ENDOSCOPY;  Service: Endoscopy;  Laterality: N/A;  . FOOT SURGERY     right  . PARTIAL HYSTERECTOMY    . UPPER GASTROINTESTINAL ENDOSCOPY     No Known Allergies Current Outpatient Medications on File Prior to Visit  Medication Sig Dispense Refill  . aspirin 81 MG tablet Take 81 mg by mouth every morning.     . Calcium Carbonate-Vitamin D (CALCIUM-VITAMIN D) 500-200 MG-UNIT tablet Take 3 tablets by mouth every morning.    . gabapentin (NEURONTIN) 800 MG tablet Take 1 tablet (800 mg total) by mouth at bedtime. 90 tablet 1  . hydrochlorothiazide (MICROZIDE) 12.5 MG capsule take 1 capsule by mouth EVERY TUESDAY AND SATURDAY 90 capsule 1  . hydrocortisone 2.5 % ointment APPLY TO AFFECTED AREA TWICE A DAY 28.35 g 0  . hydroxypropyl methylcellulose (ISOPTO TEARS) 2.5 % ophthalmic solution Place 1 drop into both eyes 2 (two) times daily as needed for dry eyes.     Marland Kitchen lactose free nutrition (BOOST) LIQD Take 237 mLs by mouth 2 (two) times daily between meals. (Patient taking differently: Take 1 Container by mouth 2 (two) times daily as needed (nutritional). ) 60 Can 11  . metoprolol succinate (TOPROL-XL) 50 MG 24 hr tablet take 1 tablet by mouth once daily WITH OR IMMEDIATELY FOLLOWING A MEAL 90 tablet 3  . pantoprazole (PROTONIX) 40 MG tablet TAKE 1 TABLET BY MOUTH ONCE DAILY 30 tablet 3  . PREMARIN vaginal  cream ONE-HALF APPLICATOR 2 TIMES A WEEK AS DIRECTED  0  . promethazine (PHENERGAN) 12.5 MG tablet take 1 tablet by mouth every 6 hours if needed for nausea and vomiting 30 tablet 0  . traMADol-acetaminophen (ULTRACET) 37.5-325 MG tablet Take 1 tablet by mouth every 6 (six) hours.  0  . triamcinolone cream (KENALOG) 0.1 % apply to affected area twice a day  0  . verapamil (CALAN-SR) 240 MG CR tablet take 1 tablet by mouth once daily 90 tablet 1  . vitamin C (ASCORBIC ACID) 500 MG tablet Take 500 mg by mouth daily.     No current facility-administered medications on file prior to visit.    Social History   Socioeconomic History  . Marital status: Single    Spouse name: Not on file  . Number of children: 1  . Years of education: Not on file  . Highest education level: Not on file  Occupational History    Employer: RETIRED  Social Needs  . Financial resource strain: Not on file  . Food insecurity:    Worry: Not on file    Inability: Not on file  . Transportation needs:    Medical: Not on file    Non-medical: Not on file  Tobacco Use  . Smoking status: Never Smoker  . Smokeless tobacco: Never Used  Substance and Sexual Activity  . Alcohol use: No    Alcohol/week: 0.0 oz  . Drug use: No  . Sexual activity: Never    Birth control/protection: Post-menopausal  Lifestyle  . Physical activity:    Days per week: Not on file    Minutes per session: Not on file  . Stress: Not on file  Relationships  . Social connections:    Talks on phone: Not on file    Gets together: Not on file    Attends religious service: Not on file    Active member of club or organization: Not on file    Attends meetings of clubs or organizations: Not on file    Relationship status: Not on file  . Intimate partner violence:    Fear of current or ex partner: Not on file    Emotionally abused: Not on file    Physically abused: Not on file    Forced sexual activity: Not on file  Other Topics Concern  .  Not on file  Social History Narrative   Marital status: divorced.      Children: 1 son; no grandchildren      Lives: with son      Employment: retired      Tobacco: none      Alcohol: none      Drugs: none       Exercise: none       ADLs:  Independent with ADLs.  Quit driving age 27.  Medical CNA cleans house; makes breakfast; patient pays bills.      Advanced Directives:   YES.  HCPOA:  Son/Isaac Bowman.  FULL CODE; no prolonged measures.   Family History  Problem Relation Age of Onset  . Breast cancer Mother   . Heart attack Mother   . Heart disease Father   . Alcohol abuse Son   . Colon cancer Neg Hx   . Stomach cancer Neg Hx   . Rectal cancer Neg Hx        Objective:    BP (!) 158/70   Pulse 95   Temp 98 F (36.7 C) (Oral)   Resp 16   Wt 108 lb (49 kg)   SpO2 96%   BMI 22.36 kg/m  Physical Exam  Constitutional: She is oriented to person, place, and time. She appears well-developed and well-nourished. No distress.  HENT:  Head: Normocephalic and atraumatic.  Right Ear: External ear normal.  Left Ear: External ear normal.  Nose: Nose normal.  Mouth/Throat: Oropharynx is clear and moist.  Eyes: Pupils are equal, round, and reactive to light. Conjunctivae and EOM are normal.  Neck: Normal range of motion and full passive range of motion without pain. Neck supple. No JVD present. Carotid bruit is not present. No tracheal deviation present. No thyromegaly present.  Cardiovascular: Normal rate, regular rhythm, normal heart sounds and intact distal pulses. Exam reveals no gallop and no friction rub.  No murmur heard. Pulmonary/Chest: Effort normal and breath sounds normal. She has no wheezes. She has no rales.  Abdominal: Soft. Bowel sounds are normal. She exhibits no distension and no mass. There is no tenderness. There is no rebound and no guarding.  Musculoskeletal:       Right shoulder: Normal.       Left shoulder: Normal.       Cervical back: She exhibits  decreased range of motion, tenderness, pain and spasm. She exhibits no bony tenderness and normal pulse.  Lymphadenopathy:    She has no cervical adenopathy.  Neurological: She is alert and oriented to person, place, and time. She has normal reflexes. No cranial nerve deficit. She exhibits normal muscle tone. Coordination normal.  Skin: Skin is warm and dry. No rash noted. She is not diaphoretic. No erythema. No pallor.  Psychiatric: She has a normal mood and affect. Her behavior is normal. Judgment and thought content normal.  Nursing note and vitals reviewed.  No results found. Depression screen [facility] 2/9 04/15/2018 12/10/2017 11/21/2017 05/30/2017 11/04/2016  Decreased Interest 0 0 0 0 0  Down, Depressed, Hopeless 0 0 0 0 0  PHQ - 2 Score 0 0 0 0 0  Some recent data might be hidden   Fall Risk  04/15/2018 12/10/2017 11/21/2017 05/30/2017 11/04/2016  Falls in the past year? _0   Number falls in past yr: - - - - -  Injury with Fall? - - - - -        Assessment & Plan:   1. Iron deficiency anemia secondary to inadequate dietary iron intake   2. Essential hypertension   3. Barrett's esophagus without dysplasia   4. Coronary artery disease involving native coronary artery of native heart without angina pectoris   5. Gastroesophageal reflux disease without esophagitis   6. DDD (degenerative disc disease), cervical   7. Thyroid nodule   8. Leg cramps   9. Acute non intractable tension-type headache     Neck pain with headaches: New onset and presented to ED for symptoms; s/p CT head and cervical spine that revealed DDD cervical spine with concern for foraminal impingement; NS consultation recommended; obtain ESR; refer to NS.  Continue Gabapentin qhs; rx for Flexeril PRN provided; home exercise program recommended.  Use Tramadol as needed for moderate pain; use Tylenol for mild pain.  LEFT thyroid nodule: New; identified on CT cervical spine; refer for thyroid US.  Leg cramps:  new onset; recommend increased hydration throughout day; obtain labs to rule out secondary causes.  HTN/CAD/hypercholesterolemia: controlled; asymptomatic; obtain labs; continue current medications.   Orders Placed This Encounter  Procedures  . US Soft Tissue Head/Neck    Standing Status:   Future    Standing Expiration Date:   06/16/2019    Order Specific Question:   Reason for Exam (SYMPTOM  OR DIAGNOSIS REQUIRED)    Answer:   left thyroid nodule 1.5cm on CT cervical spine    Order Specific Question:   Preferred imaging location?    Answer:   [facility]  . CBC with Differential/Platelet  . Comprehensive metabolic panel  . Magnesium  . Sedimentation rate  . TSH  . T4, free  . Ambulatory referral to Neurosurgery    Referral Priority:   Routine    Referral Type:   Surgical    Referral Reason:   Specialty Services Required    Requested Specialty:   Neurosurgery    Number of Visits Requested:   1   Meds ordered this encounter  Medications  . cyclobenzaprine (FLEXERIL) 5 MG tablet    Sig: TAKE 1/2 TO 1 TABLET BY MOUTH  UP TO TWICE DAILY AS NEEDED FOR MUSCLE SPASMS    Dispense:  45 tablet    Refill:  0    No follow-ups on file.   Arika Mainer Elayne Guerin, M.D. Primary Care at [facility] previously [facility] [place] [address] [place], [place]  [address] [phone] phone [phone] fax

## 2018-04-16 LAB — COMPREHENSIVE METABOLIC PANEL
A/G RATIO: 1.5 (ref 1.2–2.2)
ALK PHOS: 55 IU/L (ref 39–117)
ALT: 14 IU/L (ref 0–32)
AST: 13 IU/L (ref 0–40)
Albumin: 3.7 g/dL (ref 3.5–4.7)
BUN/Creatinine Ratio: 22 (ref 12–28)
BUN: 19 mg/dL (ref 8–27)
CALCIUM: 9.6 mg/dL (ref 8.7–10.3)
CHLORIDE: 102 mmol/L (ref 96–106)
CO2: 27 mmol/L (ref 20–29)
Creatinine, Ser: 0.86 mg/dL (ref 0.57–1.00)
GFR calc Af Amer: 73 mL/min/{1.73_m2} (ref >59)
GFR, EST NON AFRICAN AMERICAN: 63 mL/min/{1.73_m2} (ref >59)
GLOBULIN, TOTAL: 2.5 g/dL (ref 1.5–4.5)
Glucose: 87 mg/dL (ref 65–99)
POTASSIUM: 4.3 mmol/L (ref 3.5–5.2)
SODIUM: 144 mmol/L (ref 134–144)
Total Protein: 6.2 g/dL (ref 6.0–8.5)

## 2018-04-16 LAB — CBC WITH DIFFERENTIAL/PLATELET
BASOS: 0 %
Basophils Absolute: 0 10*3/uL (ref 0.0–0.2)
EOS (ABSOLUTE): 0.2 10*3/uL (ref 0.0–0.4)
EOS: 5 %
HEMATOCRIT: 34.5 % (ref 34.0–46.6)
Hemoglobin: 10.8 g/dL — ABNORMAL LOW (ref 11.1–15.9)
IMMATURE GRANULOCYTES: 0 %
Immature Grans (Abs): 0 10*3/uL (ref 0.0–0.1)
LYMPHS ABS: 0.9 10*3/uL (ref 0.7–3.1)
Lymphs: 17 %
MCH: 27.1 pg (ref 26.6–33.0)
MCHC: 31.3 g/dL — AB (ref 31.5–35.7)
MCV: 87 fL (ref 79–97)
MONOS ABS: 0.4 10*3/uL (ref 0.1–0.9)
Monocytes: 8 %
NEUTROS ABS: 3.7 10*3/uL (ref 1.4–7.0)
NEUTROS PCT: 70 %
PLATELETS: 511 10*3/uL — AB (ref 150–379)
RBC: 3.99 x10E6/uL (ref 3.77–5.28)
RDW: 14.5 % (ref 12.3–15.4)
WBC: 5.2 10*3/uL (ref 3.4–10.8)

## 2018-04-16 LAB — TSH: TSH: 1.47 u[IU]/mL (ref 0.450–4.500)

## 2018-04-16 LAB — MAGNESIUM: Magnesium: 2.3 mg/dL (ref 1.6–2.3)

## 2018-04-16 LAB — SPECIMEN STATUS REPORT

## 2018-04-16 LAB — SEDIMENTATION RATE: SED RATE: 11 mm/h (ref 0–40)

## 2018-04-16 LAB — T4, FREE: Free T4: 0.94 ng/dL (ref 0.82–1.77)

## 2018-04-17 ENCOUNTER — Telehealth: Payer: Self-pay | Admitting: Family Medicine

## 2018-04-17 NOTE — Telephone Encounter (Signed)
Pt  Called  Requesting  Clarification  On  Muscle  Relaxant  -   Pt  Was  Advised  To take  Medication  As   Directed  And  Prescribed  Advised  Of  Drowsiness precautions   Pt advised  To  Take  Her  Other  Medications  As  Prescribed and  To call back if  Any further  [place] or concerns

## 2018-04-17 NOTE — Telephone Encounter (Signed)
Incoming fax from [facility] dated 04/16/18, cyclobenzaprine has been approved.   Phone call to [employer] on [employer]. Pharmacy notified of the above.   Phone call to patient. Patient notified. She states she has additional questions for "Dr. Thompson Caul nurse", confirmed patient would like to talk to [place], [place]. Will address request in separate phone encounter. Patient ended call.

## 2018-04-17 NOTE — Telephone Encounter (Signed)
While on a call notifying patient of prior authorization approval for her cyclobenzaprine, patient requests [place], [place] call her.

## 2018-04-18 ENCOUNTER — Encounter: Payer: Self-pay | Admitting: Gastroenterology

## 2018-04-19 ENCOUNTER — Ambulatory Visit: Payer: Self-pay

## 2018-04-19 NOTE — Telephone Encounter (Signed)
Patient called in with questions about her medication for headache. She says "I have a headache and took 1 Tylenol. Is that ok to take? I have this Flexeril to take. Can I use that for a headache? That's what the doctor said." I advised she can take the Tylenol and Flexeril as prescribed for a headache, 1/2 to 1 tablet twice a day as needed. I asked does she have Tramadol, she says "yes, that's for my back and it's hurting. Can I go ahead and take that too with the Flexeril?" I advised if her back is hurting to take the Tramadol every 6 hours as needed, which will probably help the headache as well. Use the Flexeril for headache as the doctor prescribed. She verbalized understanding.   Reason for Disposition . General information question, no triage required and triager able to answer question  Answer Assessment - Initial Assessment Questions 1. REASON FOR CALL or QUESTION: "What is your reason for calling today?" or "How can I best help you?" or "What question do you have that I can help answer?"     I need to know what medicine to take for the headache  Protocols used: INFORMATION ONLY CALL-A-AH

## 2018-04-22 ENCOUNTER — Other Ambulatory Visit: Payer: Self-pay | Admitting: Family Medicine

## 2018-04-23 ENCOUNTER — Telehealth: Payer: Self-pay | Admitting: Family Medicine

## 2018-04-23 ENCOUNTER — Ambulatory Visit: Payer: Self-pay | Admitting: *Deleted

## 2018-04-23 ENCOUNTER — Telehealth: Payer: Self-pay

## 2018-04-23 NOTE — Telephone Encounter (Signed)
Pt called with wanting to know if she should take her muscle relaxer for her head. She states when she pushes on her head it hurts, but also she has a hx of migraines. She was prescribed cyclobenzaprine 5 mg at her office visit on 04/15/18. I reminded her that it is a muscle relaxer and she said that she really needed it because of stiff neck and arthritis. I reminded her to start off with a half tablet and not to take it with the phenergan because it could make her really drowsy. She voiced understanding.  She also had a question regarding her ultra sound. When is it scheduled and suppose to get a referral to a neurologist.  She is waiting for a call back regarding these. Pt advised to call back if she has any questions or her headache gets worse. She voiced understanding.  Reason for Disposition . Caller has medication question, adult has minor symptoms, caller declines triage, and triager answers question  Answer Assessment - Initial Assessment Questions 1. SYMPTOMS: "Do you have any symptoms?"     Has arthritis and migraines 2. SEVERITY: If symptoms are present, ask "Are they mild, moderate or severe?"     moderate  Answer Assessment - Initial Assessment Questions 1. REASON FOR CALL or QUESTION: "What is your reason for calling today?" or "How can I best help you?" or "What question do you have that I can help answer?"     Pt had questions regarding a medication and an ultra sound of her neck and referral to a neurologist.  Protocols used: MEDICATION QUESTION CALL-A-AH, INFORMATION ONLY CALL-A-AH

## 2018-04-23 NOTE — Telephone Encounter (Signed)
Patient evaluated in office on 04/18/18 for ED follow-up.  No further action warranted.

## 2018-04-23 NOTE — Telephone Encounter (Signed)
Called pharmacy and pharmacy had refill on file. Pt made aware.

## 2018-04-23 NOTE — Telephone Encounter (Signed)
Pt had been informed and no understanding how to take medication.

## 2018-04-23 NOTE — Telephone Encounter (Signed)
Call from pt re clarification on medication.  Provided instructions for Flexeril and Tramadol.  Suggested using caution regarding taking them simultaneously.  Pt questioned taking Tylenol.  Advised against taking tylenol with the prescribed pain med as it has Tylenol in it.  Pt states she has also had a migraine.  Asks if she should do any outdoor work.  Advised against outdoor work today d/t back pain, effects of medication, and heat.

## 2018-04-23 NOTE — Telephone Encounter (Signed)
Copied from [place] [id number]. Topic: Quick Communication - Rx Refill/Question >> Apr 23, 2018 11:01 AM Aurelio Brash B wrote: Medication: gabapentin (NEURONTIN) 800 MG tablet   Has the patient contacted their pharmacy? Yes   (Agent: If no, request that the patient contact the pharmacy for the refill.)  Preferred Pharmacy (with phone number or street name): [facility] [phone] - [place], [place] [address] [phone] (Phone) [phone] (Fax)       Agent: Please be advised that RX refills may take up to 3 business days. We ask that you follow-up with your pharmacy.

## 2018-04-25 ENCOUNTER — Ambulatory Visit: Payer: Self-pay | Admitting: *Deleted

## 2018-04-25 NOTE — Telephone Encounter (Signed)
Pt called several times to talk about her medications. The phone ranged several times and unable to leave a message.

## 2018-04-29 ENCOUNTER — Ambulatory Visit: Payer: Medicare Other | Admitting: Family Medicine

## 2018-05-01 ENCOUNTER — Other Ambulatory Visit: Payer: Medicare Other

## 2018-05-16 ENCOUNTER — Ambulatory Visit
Admission: RE | Admit: 2018-05-16 | Discharge: 2018-05-16 | Disposition: A | Discharge status: Home / Self Care | Payer: Medicare Other | Source: Ambulatory Visit | Attending: Family Medicine | Admitting: Family Medicine

## 2018-05-16 DIAGNOSIS — E041 Nontoxic single thyroid nodule: Secondary | ICD-10-CM

## 2018-05-17 ENCOUNTER — Telehealth: Payer: Self-pay | Admitting: Family Medicine

## 2018-05-17 NOTE — Telephone Encounter (Signed)
Copied from [place] [id number]. Topic: Quick Communication - See Telephone Encounter >> May 17, 2018  4:22 PM Boyd Kerbs wrote: CRM for notification. See Telephone encounter for: 05/17/18.   Pt. Is calling about imaging results

## 2018-05-21 ENCOUNTER — Encounter: Payer: Self-pay | Admitting: Family Medicine

## 2018-05-21 ENCOUNTER — Encounter: Payer: Self-pay | Admitting: *Deleted

## 2018-05-21 ENCOUNTER — Encounter: Payer: Self-pay | Admitting: Radiology

## 2018-05-21 NOTE — Telephone Encounter (Signed)
Letter sent.  Patient was also called ... Voiced understanding

## 2018-05-22 ENCOUNTER — Telehealth: Payer: Self-pay | Admitting: Family Medicine

## 2018-05-22 ENCOUNTER — Encounter: Payer: Self-pay | Admitting: Family Medicine

## 2018-05-22 NOTE — Telephone Encounter (Signed)
Copied from [place] [id number]. Topic: Quick Communication - See Telephone Encounter >> May 22, 2018  2:57 PM Cleaster Corin, NT wrote: CRM for notification. See Telephone encounter for: 05/22/18.  Pt. Calling with questions about her lab results pt. Would like a callback from nurse

## 2018-05-24 ENCOUNTER — Telehealth: Payer: Self-pay | Admitting: Family Medicine

## 2018-05-24 NOTE — Telephone Encounter (Signed)
Copied from [place] [id number]. Topic: Inquiry >> May 23, 2018  2:19 PM Margot Ables wrote: Reason for CRM: pt called stating she saw Dr. Arnoldo Morale with neurosurgery and she did not want to see him again. She asked them to send records to Dr. Tamala Julian. She said that he acted like he couldn't touch her like he was prejudiced. She said that he wouldn't prescribe anything or touch anything. Just told her she has arthritis. Pt wants to know why she was referred to him.

## 2018-05-25 ENCOUNTER — Emergency Department (HOSPITAL_COMMUNITY)
Admission: EM | Admit: 2018-05-25 | Discharge: 2018-05-25 | Disposition: A | Discharge status: Home / Self Care | Payer: Medicare Other | Attending: Emergency Medicine | Admitting: Emergency Medicine

## 2018-05-25 ENCOUNTER — Encounter (HOSPITAL_COMMUNITY): Payer: Self-pay

## 2018-05-25 DIAGNOSIS — Z7982 Long term (current) use of aspirin: Secondary | ICD-10-CM | POA: Insufficient documentation

## 2018-05-25 DIAGNOSIS — M436 Torticollis: Secondary | ICD-10-CM | POA: Insufficient documentation

## 2018-05-25 DIAGNOSIS — G8929 Other chronic pain: Secondary | ICD-10-CM | POA: Insufficient documentation

## 2018-05-25 DIAGNOSIS — I2510 Atherosclerotic heart disease of native coronary artery without angina pectoris: Secondary | ICD-10-CM | POA: Insufficient documentation

## 2018-05-25 DIAGNOSIS — I10 Essential (primary) hypertension: Secondary | ICD-10-CM | POA: Diagnosis not present

## 2018-05-25 DIAGNOSIS — M542 Cervicalgia: Secondary | ICD-10-CM | POA: Diagnosis present

## 2018-05-25 DIAGNOSIS — Z79899 Other long term (current) drug therapy: Secondary | ICD-10-CM | POA: Diagnosis not present

## 2018-05-25 DIAGNOSIS — G4489 Other headache syndrome: Secondary | ICD-10-CM | POA: Diagnosis not present

## 2018-05-25 DIAGNOSIS — F0390 Unspecified dementia without behavioral disturbance: Secondary | ICD-10-CM | POA: Insufficient documentation

## 2018-05-25 DIAGNOSIS — R51 Headache: Secondary | ICD-10-CM | POA: Diagnosis not present

## 2018-05-25 DIAGNOSIS — R519 Headache, unspecified: Secondary | ICD-10-CM

## 2018-05-25 DIAGNOSIS — R52 Pain, unspecified: Secondary | ICD-10-CM | POA: Diagnosis not present

## 2018-05-25 MED ORDER — METHOCARBAMOL 500 MG PO TABS
500.0000 mg | ORAL_TABLET | Freq: Once | ORAL | Status: AC
  Administered 2018-05-25: 500 mg via ORAL
  Filled 2018-05-25: qty 1

## 2018-05-25 MED ORDER — LIDOCAINE 5 % EX PTCH: 1.0000 | MEDICATED_PATCH | CUTANEOUS | 0 refills | Status: DC

## 2018-05-25 MED ORDER — METHOCARBAMOL 500 MG PO TABS: 500.0000 mg | ORAL_TABLET | Freq: Two times a day (BID) | ORAL | 0 refills | Status: DC

## 2018-05-25 MED ORDER — KETOROLAC TROMETHAMINE 60 MG/2ML IM SOLN
15.0000 mg | Freq: Once | INTRAMUSCULAR | Status: AC
  Administered 2018-05-25: 15 mg via INTRAMUSCULAR
  Filled 2018-05-25: qty 2

## 2018-05-25 MED ORDER — DICLOFENAC SODIUM 1 % TD GEL: 2.0000 g | Freq: Four times a day (QID) | TRANSDERMAL | 1 refills | Status: DC

## 2018-05-25 NOTE — Discharge Instructions (Signed)
Stop taking the Flexeril and start taking the Robaxin for muscle spasm.  You can wear the patches for 12 hours a day and use the cream the other amount of time.  You could also try getting a therapeutic massage or dry needling to improve the pain.  You can talk to Dr. Tamala Julian about this.

## 2018-05-25 NOTE — ED Notes (Signed)
Pt verbalizes understanding of d/c instructions. Pt received prescriptions. Pt taken to lobby in wheelchair at d/c with all belongings and with friends.

## 2018-05-25 NOTE — ED Triage Notes (Signed)
To hallway via EMS.  Onset 1 month posterior right lower headache.  Tender to touch.  Lives with son.   EMS BP 160/86 HR 90 RR 16

## 2018-05-25 NOTE — ED Notes (Signed)
ED Provider at bedside. 

## 2018-05-25 NOTE — ED Notes (Signed)
Pt up for discharge. Friend on way to pick her up. Will review d/c papers once ride arrives. Leaving pt in hallway bed d/t hx dementia.

## 2018-05-25 NOTE — ED Provider Notes (Addendum)
[facility] EMERGENCY DEPARTMENT Provider Note   CSN: [id number] Arrival date & time: 05/25/18  1319     History   Chief Complaint Chief Complaint  Patient presents with  . Headache    HPI [name] is a 84 y.o. female.  Patient is an 84 year old female with a history of dementia, coronary artery disease, Barrett's esophagus, acute renal insufficiency, glaucoma, arthritis and spondylolisthesis who presents today with ongoing right neck and head pain.  Patient was seen in the department approximately 2 months ago and at that time had imaging of her head and neck which showed chronic cervical disease as well as a thyroid nodule.  She had no acute process in her brain.  Patient states that she has been taking Tylenol and occasionally a Flexeril without significant improvement in her symptoms.  Every morning she wakes up and has pain in the right side of her neck that radiates up into the back right portion of her head.  It is worse if she tries to look to the right or move her head to the right.  She denies any fever but states she has not been eating as much because chewing makes the pain hurt worse.  Patient states that she saw neurosurgery and they told her there was nothing wrong and they could not do anything for her.  She is requesting to see a neurologist.  The history is provided by the patient.  Headache   This is a chronic problem. Episode onset: 2 months. The problem occurs constantly. The problem has been gradually worsening. Associated with: neck pain. The pain is located in the right unilateral and occipital region. The quality of the pain is described as throbbing. The pain is at a severity of 5/10. The pain is moderate. The pain radiates to the right neck. Pertinent negatives include no anorexia, no fever, no malaise/fatigue, no chest pressure, no palpitations, no syncope, no shortness of breath, no nausea and no vomiting. Associated symptoms comments: No  numbness or weakness in the arms or legs.  No visual changes. She has tried acetaminophen (occassional flexeril 2.5mg ) for the symptoms. The treatment provided no relief.    Past Medical History:  Diagnosis Date  . Abnormal finding on Pap smear, ASCUS   . Acute renal insufficiency   . Amputation finger    index  . Barrett's esophagus   . CAD (coronary artery disease)   . Dementia   . Diverticulosis   . Elevated BP   . Foot deformity   . GERD (gastroesophageal reflux disease)   . Glaucoma   . H/O: GI bleed   . HSV-2 (herpes simplex virus 2) infection   . Hyperglycemia   . Hypertension   . Migraine   . Osteoporosis    worsening  . Spondylosis     Patient Active Problem List   Diagnosis Date Noted  . Degenerative disc disease, lumbar 01/01/2018  . Hiatal hernia 11/04/2016  . Acute blood loss anemia   . Gastroesophageal reflux disease without esophagitis 07/31/2014  . Gastric ulcer without hemorrhage or perforation 07/22/2013  . History of syphilis 02/27/2012  . Dementia   . Spondylosis   . Foot deformity   . Migraine   . CAD (coronary artery disease)   . HSV-2 (herpes simplex virus 2) infection   . Abnormal finding on Pap smear, ASCUS   . Osteoporosis   . CONSTIPATION 12/12/2010  . ANEMIA, IRON DEFICIENCY 06/17/2008  . BARRETTS ESOPHAGUS 06/17/2008  . GLAUCOMA  06/16/2008  . Essential hypertension 06/16/2008  . DIVERTICULOSIS, COLON 06/16/2008  . ARTHRITIS 06/16/2008  . SLEEP APNEA 06/16/2008    Past Surgical History:  Procedure Laterality Date  . AMPUTATION FINGER / THUMB     secondary to osteomyelitis  . CATARACT EXTRACTION, BILATERAL Bilateral 12/25/2016  . COLONOSCOPY    . ESOPHAGOGASTRODUODENOSCOPY N/A 10/22/2016   Procedure: ESOPHAGOGASTRODUODENOSCOPY (EGD);  Surgeon: Doran Stabler, MD;  Location: [facility] ENDOSCOPY;  Service: Endoscopy;  Laterality: N/A;  . FOOT SURGERY     right  . PARTIAL HYSTERECTOMY    . UPPER GASTROINTESTINAL ENDOSCOPY       OB  History   None      Home Medications    Prior to Admission medications   Medication Sig Start Date End Date Taking? Authorizing Provider  aspirin 81 MG tablet Take 81 mg by mouth every morning.     [provider]  Calcium Carbonate-Vitamin D (CALCIUM-VITAMIN D) 500-200 MG-UNIT tablet Take 3 tablets by mouth every morning.    [provider]  cyclobenzaprine (FLEXERIL) 5 MG tablet TAKE 1/2 TO 1 TABLET BY MOUTH  UP TO TWICE DAILY AS NEEDED FOR MUSCLE SPASMS 04/15/18   Wardell Honour, MD  gabapentin (NEURONTIN) 800 MG tablet Take 1 tablet (800 mg total) by mouth at bedtime. 12/10/17   Wardell Honour, MD  hydrochlorothiazide (MICROZIDE) 12.5 MG capsule take 1 capsule by mouth EVERY TUESDAY AND SATURDAY 12/10/17   Wardell Honour, MD  hydrocortisone 2.5 % ointment APPLY TO AFFECTED AREA TWICE A DAY 03/12/18   Wardell Honour, MD  hydroxypropyl methylcellulose (ISOPTO TEARS) 2.5 % ophthalmic solution Place 1 drop into both eyes 2 (two) times daily as needed for dry eyes.     [provider]  lactose free nutrition (BOOST) LIQD Take 237 mLs by mouth 2 (two) times daily between meals. Patient taking differently: Take 1 Container by mouth 2 (two) times daily as needed (nutritional).  03/22/16   [name], MD  metoprolol succinate (TOPROL-XL) 50 MG 24 hr tablet take 1 tablet by mouth once daily WITH OR IMMEDIATELY FOLLOWING A MEAL 12/10/17   Wardell Honour, MD  pantoprazole (PROTONIX) 40 MG tablet TAKE 1 TABLET BY MOUTH ONCE DAILY 04/01/18   Armbruster, Carlota Raspberry, MD  PREMARIN vaginal cream ONE-HALF APPLICATOR 2 TIMES A WEEK AS DIRECTED 11/09/17   [provider]  promethazine (PHENERGAN) 12.5 MG tablet take 1 tablet by mouth every 6 hours if needed for nausea and vomiting 11/26/17   Armbruster, Carlota Raspberry, MD  traMADol-acetaminophen (ULTRACET) 37.5-325 MG tablet Take 1 tablet by mouth every 6 (six) hours. 10/25/17   [provider]  triamcinolone cream  (KENALOG) 0.1 % apply to affected area twice a day 09/24/17   [provider]  verapamil (CALAN-SR) 240 MG CR tablet Take 1 tablet (240 mg total) by mouth daily. 04/15/18   Wardell Honour, MD  vitamin C (ASCORBIC ACID) 500 MG tablet Take 500 mg by mouth daily.    [provider]    Family History Family History  Problem Relation Age of Onset  . Breast cancer Mother   . Heart attack Mother   . Heart disease Father   . Alcohol abuse Son   . Colon cancer Neg Hx   . Stomach cancer Neg Hx   . Rectal cancer Neg Hx     Social History Social History   Tobacco Use  . Smoking status: Never Smoker  .  Smokeless tobacco: Never Used  Substance Use Topics  . Alcohol use: No    Alcohol/week: 0.0 oz  . Drug use: No     Allergies   Patient has no known allergies.   Review of Systems Review of Systems  Constitutional: Negative for fever and malaise/fatigue.  Respiratory: Negative for shortness of breath.   Cardiovascular: Negative for palpitations and syncope.  Gastrointestinal: Negative for anorexia, nausea and vomiting.  Neurological: Positive for headaches.  All other systems reviewed and are negative.    Physical Exam Updated Vital Signs BP (!) 173/87   Pulse 94   Temp 97.9 F (36.6 C) (Oral)   Resp 16   SpO2 100%   Physical Exam  Constitutional: She is oriented to person, place, and time. She appears well-developed and well-nourished. No distress.  HENT:  Head: Normocephalic and atraumatic.  Mouth/Throat: Oropharynx is clear and moist.  No temporal artery tenderness  Eyes: Pupils are equal, round, and reactive to light. Conjunctivae and EOM are normal.  Neck: Normal range of motion. Neck supple.    Cardiovascular: Normal rate, regular rhythm and intact distal pulses.  No murmur heard. Pulmonary/Chest: Effort normal and breath sounds normal. No respiratory distress. She has no wheezes. She has no rales.  Abdominal: Soft. She exhibits no distension.  There is no tenderness. There is no rebound and no guarding.  Musculoskeletal: Normal range of motion. She exhibits no edema or tenderness.  Neurological: She is alert and oriented to person, place, and time.  5 out of 5 strength in all 4 extremities.  Sensation is intact.  No cranial nerve deficits.  No visual field cuts.  Gait is within normal limits.  Skin: Skin is warm and dry. No rash noted. No erythema.  Psychiatric: She has a normal mood and affect. Her behavior is normal.  Patient is pleasant and cooperative.  Occasionally she says that she is talking to a spirit that is over to her right side but otherwise acts like it is just part of her normal life.  The spirits do not seem to bother her and seem to comfort her.  She denies any concerns of people trying to hurt her.  No paranoia  Nursing note and vitals reviewed.    ED Treatments / Results  Labs (all labs ordered are listed, but only abnormal results are displayed) Labs Reviewed - No data to display  EKG None  Radiology No results found.  Procedures Procedures (including critical care time)  Medications Ordered in ED Medications  methocarbamol (ROBAXIN) tablet 500 mg (500 mg Oral Given 05/25/18 1511)  ketorolac (TORADOL) injection 15 mg (15 mg Intramuscular Given 05/25/18 1512)     Initial Impression / Assessment and Plan / ED Course  I have reviewed the triage vital signs and the nursing notes.  Pertinent labs & imaging results that were available during my care of the patient were reviewed by me and considered in my medical decision making (see chart for details).     Patient printed presenting today with neck and head pain.  Seems that this is been going on for several months.  Patient has significant spasm on exam.  She has been taking Tylenol and occasionally 2.5 mg of Flexeril.  She has followed up with neurosurgery who did not offer her any additional care.  She has been seeing her regular doctor as well but feel  she would benefit from neurology follow-up.  Also patient has some mild torticollis on exam and feel that is  part of the cause of pain making her pain worse.  Patient had CT of the head and C-spine done less than 2 months ago which showed a thyroid nodule and chronic arthritis but no other acute process.  Patient had an ultrasound of her thyroid nodule recently that just recommended follow-up in 1 year.  She denies any difficulty swallowing or change in voice.  She is neurovascularly intact.  Think more likely this pain is related to spasm and torticollis.  Patient given Robaxin and a small amount of Toradol here.  Will recommend Lidoderm patches, Voltaren gel and Robaxin at home.   4:44 PM Pt feeling much better.   Encourage patient to follow-up with Dr. Tamala Julian her PCP as well as neurology as needed  Final Clinical Impressions(s) / ED Diagnoses   Final diagnoses:  Torticollis, acute  Chronic intractable headache, unspecified headache type    ED Discharge Orders        Ordered    diclofenac sodium (VOLTAREN) 1 % GEL  4 times daily     05/25/18 1547    lidocaine (LIDODERM) 5 %  Every 24 hours     05/25/18 1547    methocarbamol (ROBAXIN) 500 MG tablet  2 times daily     05/25/18 1547       Blanchie Dessert, MD 05/25/18 1544    Blanchie Dessert, MD 05/25/18 1547    Blanchie Dessert, MD 05/25/18 1644

## 2018-05-27 NOTE — Telephone Encounter (Signed)
Attempted to call pt. No answer. No VM.

## 2018-05-29 ENCOUNTER — Encounter: Payer: Self-pay | Admitting: Neurology

## 2018-05-31 ENCOUNTER — Other Ambulatory Visit: Payer: Self-pay | Admitting: Family Medicine

## 2018-06-13 DIAGNOSIS — Z961 Presence of intraocular lens: Secondary | ICD-10-CM | POA: Diagnosis not present

## 2018-06-13 DIAGNOSIS — H26493 Other secondary cataract, bilateral: Secondary | ICD-10-CM | POA: Diagnosis not present

## 2018-06-13 DIAGNOSIS — H401122 Primary open-angle glaucoma, left eye, moderate stage: Secondary | ICD-10-CM | POA: Diagnosis not present

## 2018-06-13 DIAGNOSIS — H04123 Dry eye syndrome of bilateral lacrimal glands: Secondary | ICD-10-CM | POA: Diagnosis not present

## 2018-06-13 DIAGNOSIS — H10413 Chronic giant papillary conjunctivitis, bilateral: Secondary | ICD-10-CM | POA: Diagnosis not present

## 2018-06-16 ENCOUNTER — Other Ambulatory Visit: Payer: Self-pay | Admitting: Family Medicine

## 2018-06-20 MED ORDER — GABAPENTIN 800 MG PO TABS
800.0000 mg | ORAL_TABLET | Freq: Every day | ORAL | 1 refills | Status: DC
Start: 2018-06-20 — End: 2019-04-07

## 2018-06-21 ENCOUNTER — Ambulatory Visit (INDEPENDENT_AMBULATORY_CARE_PROVIDER_SITE_OTHER): Payer: Medicare Other | Admitting: Gastroenterology

## 2018-06-21 ENCOUNTER — Encounter: Payer: Self-pay | Admitting: Gastroenterology

## 2018-06-21 VITALS — BP 118/72 | HR 76 | Ht <= 58 in | Wt 119.1 lb

## 2018-06-21 DIAGNOSIS — K2270 Barrett's esophagus without dysplasia: Secondary | ICD-10-CM

## 2018-06-21 DIAGNOSIS — I2510 Atherosclerotic heart disease of native coronary artery without angina pectoris: Secondary | ICD-10-CM

## 2018-06-21 DIAGNOSIS — R1013 Epigastric pain: Secondary | ICD-10-CM | POA: Diagnosis not present

## 2018-06-21 DIAGNOSIS — K219 Gastro-esophageal reflux disease without esophagitis: Secondary | ICD-10-CM

## 2018-06-21 NOTE — Progress Notes (Signed)
HPI :  84 year old female with chronic dyspepsia and history as outlined below, here for follow-up visit.  She states she is doing pretty well since of last seen her. At the last visit we increased her Remeron to 15 mg at night in hopes to continue to stimulate her appetite. She states this actually impaired her sleeping at the higher dose, so she reduced the dose of it to 7-1/2 mg which she tolerated well, she then ran out of it and did not resume it. This was done to treat her dyspepsia improve her appetite she states it did work okay. She states her appetite remains fair If she has good food to eat she will eat it. Sometimes she states she does not have anyone to help [place] for her. She is using [place] as needed and she states this does help her symptoms. She does use Protonix for her reflux symptoms. She states she has been taking this twice daily with good control of symptoms. She states when she is stressed she can have some nausea and upset stomach.  Looking at her weight she has gained another 6 pounds over the past year. She is using boost and ensure to supplement her nutrition.  Prior workup: EGD 01/17/2016 - 1cm segment of Barrett's esophagus, no dysplasia, 7cm hiatal hernia, superficial proximal gastric ulceration, ? Lysbeth Galas lesion, normal remainder of examined stomach and duodenum - biopsies taken to rule out H pylori -  EGD 10/22/2016 - done to evaluate dark emesis, large hiatal hernia, otherwise short segment BE without any pathology noted to cause bleeding  Colonoscopy 2009 - diverticulosis Colonoscopy 02/2010 - stercoral ulcers in the rectum, diverticulosis, no polyps CT abdomen 2015 - large hiatal hernia, no mass lesions   Past Medical History:  Diagnosis Date  . Abnormal finding on Pap smear, ASCUS   . Acute renal insufficiency   . Amputation finger    index  . Barrett's esophagus   . CAD (coronary artery disease)   . Dementia   . Diverticulosis   . Elevated BP   . Foot  deformity   . GERD (gastroesophageal reflux disease)   . Glaucoma   . H/O: GI bleed   . HSV-2 (herpes simplex virus 2) infection   . Hyperglycemia   . Hypertension   . Migraine   . Osteoporosis    worsening  . Spondylosis      Past Surgical History:  Procedure Laterality Date  . AMPUTATION FINGER / THUMB     secondary to osteomyelitis  . CATARACT EXTRACTION, BILATERAL Bilateral 12/25/2016  . COLONOSCOPY    . ESOPHAGOGASTRODUODENOSCOPY N/A 10/22/2016   Procedure: ESOPHAGOGASTRODUODENOSCOPY (EGD);  Surgeon: Doran Stabler, MD;  Location: [facility] ENDOSCOPY;  Service: Endoscopy;  Laterality: N/A;  . FOOT SURGERY     right  . PARTIAL HYSTERECTOMY    . UPPER GASTROINTESTINAL ENDOSCOPY     Family History  Problem Relation Age of Onset  . Breast cancer Mother   . Heart attack Mother   . Heart disease Father   . Alcohol abuse Son   . Colon cancer Neg Hx   . Stomach cancer Neg Hx   . Rectal cancer Neg Hx    Social History   Tobacco Use  . Smoking status: Never Smoker  . Smokeless tobacco: Never Used  Substance Use Topics  . Alcohol use: No    Alcohol/week: 0.0 oz  . Drug use: No   Current Outpatient Medications  Medication Sig Dispense Refill  .  aspirin 81 MG tablet Take 81 mg by mouth every morning.     . Calcium Carbonate-Vitamin D (CALCIUM-VITAMIN D) 500-200 MG-UNIT tablet Take 3 tablets by mouth every morning.    . cyclobenzaprine (FLEXERIL) 5 MG tablet TAKE 1/2 TO 1 TABLET BY MOUTH  UP TO TWICE DAILY AS NEEDED FOR MUSCLE SPASMS 45 tablet 0  . diclofenac sodium (VOLTAREN) 1 % GEL Apply 2 g topically 4 (four) times daily. 100 g 1  . gabapentin (NEURONTIN) 800 MG tablet Take 1 tablet (800 mg total) by mouth at bedtime. 90 tablet 1  . hydrochlorothiazide (MICROZIDE) 12.5 MG capsule take 1 capsule by mouth EVERY TUESDAY AND SATURDAY 90 capsule 1  . hydrocortisone 2.5 % ointment APPLY EXTERNALLY TO THE AFFECTED AREA TWICE DAILY 28.35 g 0  . hydroxypropyl methylcellulose  (ISOPTO TEARS) 2.5 % ophthalmic solution Place 1 drop into both eyes 2 (two) times daily as needed for dry eyes.     Marland Kitchen lactose free nutrition (BOOST) LIQD Take 237 mLs by mouth 2 (two) times daily between meals. (Patient taking differently: Take 1 Container by mouth 2 (two) times daily as needed (nutritional). ) 60 Can 11  . lidocaine (LIDODERM) 5 % Place 1 patch onto the skin daily. Remove & Discard patch within 12 hours or as directed by MD.  Apply to the right side of your neck where it hurts 30 patch 0  . methocarbamol (ROBAXIN) 500 MG tablet Take 1 tablet (500 mg total) by mouth 2 (two) times daily. 20 tablet 0  . metoprolol succinate (TOPROL-XL) 50 MG 24 hr tablet take 1 tablet by mouth once daily WITH OR IMMEDIATELY FOLLOWING A MEAL 90 tablet 3  . pantoprazole (PROTONIX) 40 MG tablet TAKE 1 TABLET BY MOUTH ONCE DAILY 30 tablet 3  . PREMARIN vaginal cream ONE-HALF APPLICATOR 2 TIMES A WEEK AS DIRECTED  0  . promethazine (PHENERGAN) 12.5 MG tablet take 1 tablet by mouth every 6 hours if needed for nausea and vomiting 30 tablet 0  . traMADol-acetaminophen (ULTRACET) 37.5-325 MG tablet Take 1 tablet by mouth every 6 (six) hours.  0  . triamcinolone cream (KENALOG) 0.1 % apply to affected area twice a day  0  . verapamil (CALAN-SR) 240 MG CR tablet Take 1 tablet (240 mg total) by mouth daily. 90 tablet 1  . vitamin C (ASCORBIC ACID) 500 MG tablet Take 500 mg by mouth daily.     No current facility-administered medications for this visit.    No Known Allergies   Review of Systems: All systems reviewed and negative except where noted in HPI.   Lab Results  Component Value Date   CREATININE 0.86 04/15/2018   BUN 19 04/15/2018   NA 144 04/15/2018   K 4.3 04/15/2018   CL 102 04/15/2018   CO2 27 04/15/2018    Lab Results  Component Value Date   ALT 14 04/15/2018   AST 13 04/15/2018   ALKPHOS 55 04/15/2018   BILITOT <0.2 04/15/2018    Lab Results  Component Value Date   WBC 5.2  04/15/2018   HGB 10.8 (L) 04/15/2018   HCT 34.5 04/15/2018   MCV 87 04/15/2018   PLT 511 (H) 04/15/2018     Physical Exam: BP 118/72   Pulse 76   Ht 4\' 10"  (1.473 m)   Wt 119 lb 2 oz (54 kg)   BMI 24.90 kg/m  Constitutional: Pleasant, female in no acute distress. HEENT: Normocephalic and atraumatic. Conjunctivae are normal. No scleral  icterus. Neck supple.  Cardiovascular: Normal rate, regular rhythm.  Pulmonary/chest: Effort normal and breath sounds normal. No wheezing, rales or rhonchi. Abdominal: Soft, nondistended, nontender. There are no masses palpable.  Extremities: no edema Lymphadenopathy: No cervical adenopathy noted. Neurological: Alert and oriented to person place and time. Skin: Skin is warm and dry. No rashes noted. Psychiatric: Normal mood and affect. Behavior is normal.   ASSESSMENT AND PLAN: 84 year old female here for reassessment of the following issues:  Chronic dyspepsia / poor appetite - we treated her with a course of Remeron which she thinks helped her appetite at the time, she since tapered off and stopped it. Her appetite remains fair. She is using [place] which is helped significantly. She is also on Protonix. She has gained another 6 pounds over the past year and she is eating well. She wishes to continue the present regimen for now. She should continue her nutritional supplementation as needed. Labs stable  GERD / Barrett's esophagus - short segment BE in the setting of large hiatal hernia with Lysbeth Galas lesions. On protonix, recommend use the lowest dose need to control symptoms in light of potential side effects. She will take Protonix once daily. If she requires higher dosing to control her symptoms she can increase as needed. Otherwise regarding her Barrett's esophagus I don't feel strongly that she warrants further surveillance given her age, however we can reassess her for this at the end of 2020. I don't think she warrants any further colon cancer  screening at her age in the absence of symptoms.  [place] Cellar, MD [facility]

## 2018-06-21 NOTE — Patient Instructions (Signed)
If you are age 82 or older, your body mass index should be between 23-30. Your Body mass index is 24.9 kg/m. If this is out of the aforementioned range listed, please consider follow up with your Primary Care Provider.  If you are age 82 or younger, your body mass index should be between 19-25. Your Body mass index is 24.9 kg/m. If this is out of the aformentioned range listed, please consider follow up with your Primary Care Provider.   Decrease Protonix to once a day.  If you need to, you can take twice a day.  Continue on [place].  We are giving you samples today.  Thank you for entrusting me with your care and for choosing [facility], Dr. [place] Cellar

## 2018-06-23 NOTE — Telephone Encounter (Signed)
Call patient --- I regret to hear that she did not have a good experience with neurosurgery consultation. I referred her to neurosurgery due to her neck/cervical spine degenerative disc disease, recent neck pain, recent headaches.  At her recent emergency room visit on 03/18/18, doctor recommended neurosurgery consultation.  (I also see she presented to emergency room again on 05/25/18 for the same pain).   I see that she is scheduled to see a neurologist in August for her symptoms.

## 2018-06-26 ENCOUNTER — Other Ambulatory Visit: Payer: Self-pay | Admitting: Family Medicine

## 2018-06-26 DIAGNOSIS — H26492 Other secondary cataract, left eye: Secondary | ICD-10-CM | POA: Diagnosis not present

## 2018-06-26 DIAGNOSIS — Z961 Presence of intraocular lens: Secondary | ICD-10-CM | POA: Diagnosis not present

## 2018-06-26 NOTE — Telephone Encounter (Signed)
Flexeril refill Last OV:06/21/18 Last refill:04/15/18 45 tab/0 refill [id number] Pharmacy: [address], [place] [phone] (Phone) [phone] (Fax)

## 2018-06-26 NOTE — Telephone Encounter (Signed)
Patient states she going to see another neurology in august that she hopes she will be happy with.

## 2018-06-30 NOTE — Telephone Encounter (Signed)
Refill req for Flexeril sent to Dr. Wilhelmina Mcardle of refill due to pt's age & dementia history  Please advise.

## 2018-07-23 ENCOUNTER — Other Ambulatory Visit: Payer: Self-pay | Admitting: Gastroenterology

## 2018-07-29 ENCOUNTER — Encounter: Payer: Self-pay | Admitting: Family Medicine

## 2018-08-05 DIAGNOSIS — Z124 Encounter for screening for malignant neoplasm of cervix: Secondary | ICD-10-CM | POA: Diagnosis not present

## 2018-08-05 DIAGNOSIS — Z1231 Encounter for screening mammogram for malignant neoplasm of breast: Secondary | ICD-10-CM | POA: Diagnosis not present

## 2018-08-05 DIAGNOSIS — L292 Pruritus vulvae: Secondary | ICD-10-CM | POA: Diagnosis not present

## 2018-08-07 ENCOUNTER — Ambulatory Visit: Payer: Medicare Other | Admitting: Family Medicine

## 2018-08-08 ENCOUNTER — Ambulatory Visit: Payer: Medicare Other | Admitting: Family Medicine

## 2018-08-11 NOTE — Progress Notes (Signed)
NEUROLOGY CONSULTATION NOTE  [name] MRN: [id number] DOB: 1933/10/30  Referring provider: Blanchie Dessert, MD (ED referral) Primary care provider: Delia Chimes, MD  Reason for consult:  Headache, neck pain  HISTORY OF PRESENT ILLNESS: [name] is an 84 year old female with dementia, CAD, acute renal insufficiency, glaucoma, Barrett's esophagus, who presents for headache and neck pain.  History supplemented by ED and referring provider's notes.  She began experiencing headache and neck pain in March 2019.  It is a moderate throbbing pain in the right occipital region that radiates into the right side of the neck.  It is not associated with nausea, vomiting, photophobia, phonophobia, osmophobia, autonomic symptoms, visual disturbance, or unilateral numbness or weakness.  It was constant.  It was aggravated by pushing on the neck.  It was relieved by rest.   She takes gabapentin 800mg  at beditme.  Sometimes she takes cyclobenzaprine 2.5mg  as needed.  Once in a while she takes Excedrin. Past medications include:  Acetaminophen  03/18/18 CT HEAD WO personally reviewed and demonstrated chronic small vessel ischemic changes but no acute abnormality. 03/18/18 CT CERVICAL WO personally reviewed and demonstrated cervical scoliosis, spondylosis and degenerative disc disease with chronic osseous foraminal impingement on left at C4-5 and C5--6.  She saw neurosurgery but declined surgery.  Neck pain has since improved.  Headaches are infrequent.    PAST MEDICAL HISTORY: Past Medical History:  Diagnosis Date  . Abnormal finding on Pap smear, ASCUS   . Acute renal insufficiency   . Amputation finger    index  . Barrett's esophagus   . CAD (coronary artery disease)   . Dementia   . Diverticulosis   . Elevated BP   . Foot deformity   . GERD (gastroesophageal reflux disease)   . Glaucoma   . H/O: GI bleed   . HSV-2 (herpes simplex virus 2) infection   . Hyperglycemia   . Hypertension    . Migraine   . Osteoporosis    worsening  . Spondylosis     PAST SURGICAL HISTORY: Past Surgical History:  Procedure Laterality Date  . AMPUTATION FINGER / THUMB     secondary to osteomyelitis  . CATARACT EXTRACTION, BILATERAL Bilateral 12/25/2016  . COLONOSCOPY    . ESOPHAGOGASTRODUODENOSCOPY N/A 10/22/2016   Procedure: ESOPHAGOGASTRODUODENOSCOPY (EGD);  Surgeon: Doran Stabler, MD;  Location: [facility] ENDOSCOPY;  Service: Endoscopy;  Laterality: N/A;  . FOOT SURGERY     right  . PARTIAL HYSTERECTOMY    . UPPER GASTROINTESTINAL ENDOSCOPY      MEDICATIONS: Current Outpatient Medications on File Prior to Visit  Medication Sig Dispense Refill  . aspirin 81 MG tablet Take 81 mg by mouth every morning.     . Calcium Carbonate-Vitamin D (CALCIUM-VITAMIN D) 500-200 MG-UNIT tablet Take 3 tablets by mouth every morning.    . cyclobenzaprine (FLEXERIL) 5 MG tablet TAKE 1/2 TO 1 TABLET BY MOUTH UP TO TWICE DAILY AS NEEDED FOR MUSCLE SPASMS 30 tablet 0  . diclofenac sodium (VOLTAREN) 1 % GEL Apply 2 g topically 4 (four) times daily. 100 g 1  . gabapentin (NEURONTIN) 800 MG tablet Take 1 tablet (800 mg total) by mouth at bedtime. 90 tablet 1  . hydrochlorothiazide (MICROZIDE) 12.5 MG capsule take 1 capsule by mouth EVERY TUESDAY AND SATURDAY 90 capsule 1  . hydrocortisone 2.5 % ointment APPLY EXTERNALLY TO THE AFFECTED AREA TWICE DAILY 28.35 g 0  . hydroxypropyl methylcellulose (ISOPTO TEARS) 2.5 % ophthalmic solution Place 1 drop  into both eyes 2 (two) times daily as needed for dry eyes.     Marland Kitchen lactose free nutrition (BOOST) LIQD Take 237 mLs by mouth 2 (two) times daily between meals. (Patient taking differently: Take 1 Container by mouth 2 (two) times daily as needed (nutritional). ) 60 Can 11  . lidocaine (LIDODERM) 5 % Place 1 patch onto the skin daily. Remove & Discard patch within 12 hours or as directed by MD.  Apply to the right side of your neck where it hurts 30 patch 0  .  methocarbamol (ROBAXIN) 500 MG tablet Take 1 tablet (500 mg total) by mouth 2 (two) times daily. 20 tablet 0  . metoprolol succinate (TOPROL-XL) 50 MG 24 hr tablet take 1 tablet by mouth once daily WITH OR IMMEDIATELY FOLLOWING A MEAL 90 tablet 3  . pantoprazole (PROTONIX) 40 MG tablet TAKE 1 TABLET BY MOUTH EVERY DAY 30 tablet 5  . PREMARIN vaginal cream ONE-HALF APPLICATOR 2 TIMES A WEEK AS DIRECTED  0  . promethazine (PHENERGAN) 12.5 MG tablet take 1 tablet by mouth every 6 hours if needed for nausea and vomiting 30 tablet 0  . traMADol-acetaminophen (ULTRACET) 37.5-325 MG tablet Take 1 tablet by mouth every 6 (six) hours.  0  . triamcinolone cream (KENALOG) 0.1 % apply to affected area twice a day  0  . verapamil (CALAN-SR) 240 MG CR tablet Take 1 tablet (240 mg total) by mouth daily. 90 tablet 1  . vitamin C (ASCORBIC ACID) 500 MG tablet Take 500 mg by mouth daily.     No current facility-administered medications on file prior to visit.     ALLERGIES: No Known Allergies  FAMILY HISTORY: Family History  Problem Relation Age of Onset  . Breast cancer Mother   . Heart attack Mother   . Heart disease Father   . Alcohol abuse Son   . Colon cancer Neg Hx   . Stomach cancer Neg Hx   . Rectal cancer Neg Hx     SOCIAL HISTORY: Social History   Socioeconomic History  . Marital status: Single    Spouse name: Not on file  . Number of children: 1  . Years of education: Not on file  . Highest education level: Not on file  Occupational History    Employer: RETIRED  Social Needs  . Financial resource strain: Not on file  . Food insecurity:    Worry: Not on file    Inability: Not on file  . Transportation needs:    Medical: Not on file    Non-medical: Not on file  Tobacco Use  . Smoking status: Never Smoker  . Smokeless tobacco: Never Used  Substance and Sexual Activity  . Alcohol use: No    Alcohol/week: 0.0 standard drinks  . Drug use: No  . Sexual activity: Never     Birth control/protection: Post-menopausal  Lifestyle  . Physical activity:    Days per week: Not on file    Minutes per session: Not on file  . Stress: Not on file  Relationships  . Social connections:    Talks on phone: Not on file    Gets together: Not on file    Attends religious service: Not on file    Active member of club or organization: Not on file    Attends meetings of clubs or organizations: Not on file    Relationship status: Not on file  . Intimate partner violence:    Fear of current or ex  partner: Not on file    Emotionally abused: Not on file    Physically abused: Not on file    Forced sexual activity: Not on file  Other Topics Concern  . Not on file  Social History Narrative   Marital status: divorced.      Children: 1 son; no grandchildren      Lives: with son      Employment: retired      Tobacco: none      Alcohol: none      Drugs: none       Exercise: none       ADLs:  Independent with ADLs.  Quit driving age 67.  Medical CNA cleans house; makes breakfast; patient pays bills.      Advanced Directives:   YES.  HCPOA:  Son/Isaac Bowman.  FULL CODE; no prolonged measures.    REVIEW OF SYSTEMS: Constitutional: No fevers, chills, or sweats, no generalized fatigue, change in appetite Eyes: No visual changes, double vision, eye pain Ear, nose and throat: No hearing loss, ear pain, nasal congestion, sore throat Cardiovascular: No chest pain, palpitations Respiratory:  No shortness of breath at rest or with exertion, wheezes GastrointestinaI: No nausea, vomiting, diarrhea, abdominal pain, fecal incontinence Genitourinary:  No dysuria, urinary retention or frequency Musculoskeletal:  Mild neck pain Integumentary: No rash, pruritus, skin lesions Neurological: as above Psychiatric: No depression, insomnia, anxiety Endocrine: No palpitations, fatigue, diaphoresis, mood swings, change in appetite, change in weight, increased thirst Hematologic/Lymphatic:  No  purpura, petechiae. Allergic/Immunologic: no itchy/runny eyes, nasal congestion, recent allergic reactions, rashes  PHYSICAL EXAM: Blood pressure (!) 142/60, pulse 80, height 4\' 11"  (1.499 m), weight 119 lb (54 kg), SpO2 97 %. General: No acute distress.  Patient appears well-groomed.  Head:  Normocephalic/atraumatic Eyes:  fundi examined but not visualized Neck: supple, mild bilateral tenderness, full range of motion Back: No paraspinal tenderness Heart: regular rate and rhythm Lungs: Clear to auscultation bilaterally. Vascular: No carotid bruits. Neurological Exam: Mental status: alert and oriented to person, place, and time, recent and remote memory intact, fund of knowledge intact, attention and concentration intact, speech fluent and not dysarthric, language intact. Cranial nerves: CN I: not tested CN II: pupils equal, round and reactive to light, visual fields intact CN III, IV, VI:  full range of motion, no nystagmus, no ptosis CN V: facial sensation intact CN VII: upper and lower face symmetric CN VIII: hearing intact CN IX, X: gag intact, uvula midline CN XI: sternocleidomastoid and trapezius muscles intact CN XII: tongue midline Bulk & Tone: normal, no fasciculations. Motor:  5/5 throughout  Sensation: temperature and vibration sensation intact. Deep Tendon Reflexes:  2+ throughout, toes downgoing.  Finger to nose testing:  Without dysmetria.  Gait:  Steady.  Romberg negative.  IMPRESSION: Degenerative disc disease of cervical spine Cervicogenic headache Both stable  PLAN: 1.  Excedrin for headache, limited to no more than 2 days out of week 2.  Treat neck pain with muscle relaxant (caution for drowsiness) and NSAID as needed. 3.  Follow up as needed.  Thank you for allowing me to take part in the care of this patient.  Metta Clines, DO  CC:  Delia Chimes, MD

## 2018-08-12 ENCOUNTER — Encounter: Payer: Self-pay | Admitting: Neurology

## 2018-08-12 ENCOUNTER — Ambulatory Visit (INDEPENDENT_AMBULATORY_CARE_PROVIDER_SITE_OTHER): Payer: Medicare Other | Admitting: Neurology

## 2018-08-12 VITALS — BP 142/60 | HR 80 | Ht 59.0 in | Wt 119.0 lb

## 2018-08-12 DIAGNOSIS — M5030 Other cervical disc degeneration, unspecified cervical region: Secondary | ICD-10-CM

## 2018-08-12 NOTE — Patient Instructions (Signed)
For headache, take Excedrin but limit to no more than 2 days out of the week to prevent rebound headache For neck pain, take muscle relaxant or ibuprofen

## 2018-08-19 ENCOUNTER — Ambulatory Visit (INDEPENDENT_AMBULATORY_CARE_PROVIDER_SITE_OTHER): Payer: Medicare Other | Admitting: Podiatry

## 2018-08-19 DIAGNOSIS — L989 Disorder of the skin and subcutaneous tissue, unspecified: Secondary | ICD-10-CM

## 2018-08-19 DIAGNOSIS — M79676 Pain in unspecified toe(s): Secondary | ICD-10-CM | POA: Diagnosis not present

## 2018-08-19 DIAGNOSIS — B351 Tinea unguium: Secondary | ICD-10-CM

## 2018-08-21 NOTE — Progress Notes (Signed)
    Subjective: Patient is a 84 y.o. female presenting to the office today with a chief complaint of painful callus lesions to the right foot that have been ongoing for the past several months. Applying pressure to the areas increase the pain. She has not done anything at home to treat the symptoms.  Patient also complains of elongated, thickened nails that cause pain while ambulating in shoes. She is unable to trim her own nails. Patient presents today for further treatment and evaluation.    Past Medical History:  Diagnosis Date  . Abnormal finding on Pap smear, ASCUS   . Acute renal insufficiency   . Amputation finger    index  . Barrett's esophagus   . CAD (coronary artery disease)   . Dementia   . Diverticulosis   . Elevated BP   . Foot deformity   . GERD (gastroesophageal reflux disease)   . Glaucoma   . H/O: GI bleed   . HSV-2 (herpes simplex virus 2) infection   . Hyperglycemia   . Hypertension   . Migraine   . Osteoporosis    worsening  . Spondylosis     Objective:  Physical Exam General: Alert and oriented x3 in no acute distress  Dermatology: Hyperkeratotic lesions present on the right foot x 2. Pain on palpation with a central nucleated core noted. Skin is warm, dry and supple bilateral lower extremities. Negative for open lesions or macerations. Nails are tender, long, thickened and dystrophic with subungual debris, consistent with onychomycosis, 1-5 bilateral. No signs of infection noted.  Vascular: Palpable pedal pulses bilaterally. No edema or erythema noted. Capillary refill within normal limits.  Neurological: Epicritic and protective threshold grossly intact bilaterally.   Musculoskeletal Exam: Pain on palpation at the keratotic lesion noted. Range of motion within normal limits bilateral. Muscle strength 5/5 in all groups bilateral.  Assessment: 1. Onychodystrophic nails 1-5 bilateral with hyperkeratosis of nails.  2. Onychomycosis of nail due to  dermatophyte bilateral 3. Pre-ulcerative callus lesions to the right foot x 2   Plan of Care:  #1 Patient evaluated. #2 Excisional debridement of keratoic lesion using a chisel blade was performed without incident.  #3 Dressed with light dressing. #4 Mechanical debridement of nails 1-5 bilaterally performed using a nail nipper. Filed with dremel without incident.  #5 Patient is to return to the clinic in 6 months.   Edrick Kins, DPM [facility]  Dr. Edrick Kins, [address]                                        [place], [place] [address]                Office [phone]  Fax [phone]

## 2018-08-24 ENCOUNTER — Emergency Department (HOSPITAL_COMMUNITY): Payer: Medicare Other

## 2018-08-24 ENCOUNTER — Emergency Department (HOSPITAL_COMMUNITY)
Admission: EM | Admit: 2018-08-24 | Discharge: 2018-08-24 | Disposition: A | Discharge status: Home / Self Care | Payer: Medicare Other | Attending: Emergency Medicine | Admitting: Emergency Medicine

## 2018-08-24 ENCOUNTER — Encounter (HOSPITAL_COMMUNITY): Payer: Self-pay | Admitting: Emergency Medicine

## 2018-08-24 DIAGNOSIS — N189 Chronic kidney disease, unspecified: Secondary | ICD-10-CM | POA: Diagnosis not present

## 2018-08-24 DIAGNOSIS — R0902 Hypoxemia: Secondary | ICD-10-CM | POA: Diagnosis not present

## 2018-08-24 DIAGNOSIS — Z79899 Other long term (current) drug therapy: Secondary | ICD-10-CM | POA: Insufficient documentation

## 2018-08-24 DIAGNOSIS — M7552 Bursitis of left shoulder: Secondary | ICD-10-CM | POA: Diagnosis not present

## 2018-08-24 DIAGNOSIS — I2510 Atherosclerotic heart disease of native coronary artery without angina pectoris: Secondary | ICD-10-CM | POA: Diagnosis not present

## 2018-08-24 DIAGNOSIS — I129 Hypertensive chronic kidney disease with stage 1 through stage 4 chronic kidney disease, or unspecified chronic kidney disease: Secondary | ICD-10-CM | POA: Diagnosis not present

## 2018-08-24 DIAGNOSIS — M79603 Pain in arm, unspecified: Secondary | ICD-10-CM | POA: Diagnosis not present

## 2018-08-24 DIAGNOSIS — Z7982 Long term (current) use of aspirin: Secondary | ICD-10-CM | POA: Diagnosis not present

## 2018-08-24 DIAGNOSIS — I10 Essential (primary) hypertension: Secondary | ICD-10-CM | POA: Diagnosis not present

## 2018-08-24 DIAGNOSIS — F0390 Unspecified dementia without behavioral disturbance: Secondary | ICD-10-CM | POA: Diagnosis not present

## 2018-08-24 DIAGNOSIS — R52 Pain, unspecified: Secondary | ICD-10-CM | POA: Diagnosis not present

## 2018-08-24 DIAGNOSIS — M25512 Pain in left shoulder: Secondary | ICD-10-CM | POA: Diagnosis present

## 2018-08-24 MED ORDER — ACETAMINOPHEN 325 MG PO TABS
650.0000 mg | ORAL_TABLET | Freq: Once | ORAL | Status: AC
  Administered 2018-08-24: 650 mg via ORAL
  Filled 2018-08-24: qty 2

## 2018-08-24 NOTE — ED Notes (Signed)
Pt verbalized understanding of d/c instructions and has no further questions, VSS, nAD. This RN called [employer] for patient and patient escorted out to bench to wait for cab.

## 2018-08-24 NOTE — ED Notes (Signed)
Patient transported to X-ray 

## 2018-08-24 NOTE — ED Triage Notes (Signed)
Pt arrives via EMS from home with L arm pain since receiving flu shot in L bicep yesterday. No pain meds prior to arrival.

## 2018-08-24 NOTE — ED Provider Notes (Signed)
[name] Provider Note   CSN: [id number] Arrival date & time: 08/24/18  0442     History   Chief Complaint Chief Complaint  Patient presents with  . Arm Pain    HPI [name] is a 84 y.o. female.  Patient presents to the emergency department for evaluation of pain in her left shoulder and left arm.  She reports that she did noticed a little achy pain in her arm yesterday, but she went to her doctor and got a flu shot yesterday.  Now the entire arm is experiencing severe throbbing pain.  She is having trouble moving the arm because it hurts more when she moves it.  She denies any direct injury.     Past Medical History:  Diagnosis Date  . Abnormal finding on Pap smear, ASCUS   . Acute renal insufficiency   . Amputation finger    index  . Barrett's esophagus   . CAD (coronary artery disease)   . Dementia   . Diverticulosis   . Elevated BP   . Foot deformity   . GERD (gastroesophageal reflux disease)   . Glaucoma   . H/O: GI bleed   . HSV-2 (herpes simplex virus 2) infection   . Hyperglycemia   . Hypertension   . Migraine   . Osteoporosis    worsening  . Spondylosis     Patient Active Problem List   Diagnosis Date Noted  . Degenerative disc disease, lumbar 01/01/2018  . Hiatal hernia 11/04/2016  . Acute blood loss anemia   . Gastroesophageal reflux disease without esophagitis 07/31/2014  . Gastric ulcer without hemorrhage or perforation 07/22/2013  . History of syphilis 02/27/2012  . Dementia   . Spondylosis   . Foot deformity   . Migraine   . CAD (coronary artery disease)   . HSV-2 (herpes simplex virus 2) infection   . Abnormal finding on Pap smear, ASCUS   . Osteoporosis   . CONSTIPATION 12/12/2010  . ANEMIA, IRON DEFICIENCY 06/17/2008  . BARRETTS ESOPHAGUS 06/17/2008  . GLAUCOMA 06/16/2008  . Essential hypertension 06/16/2008  . DIVERTICULOSIS, COLON 06/16/2008  . ARTHRITIS 06/16/2008  . SLEEP APNEA  06/16/2008    Past Surgical History:  Procedure Laterality Date  . AMPUTATION FINGER / THUMB     secondary to osteomyelitis  . CATARACT EXTRACTION, BILATERAL Bilateral 12/25/2016  . COLONOSCOPY    . ESOPHAGOGASTRODUODENOSCOPY N/A 10/22/2016   Procedure: ESOPHAGOGASTRODUODENOSCOPY (EGD);  Surgeon: Doran Stabler, MD;  Location: [facility] ENDOSCOPY;  Service: Endoscopy;  Laterality: N/A;  . FOOT SURGERY     right  . PARTIAL HYSTERECTOMY    . UPPER GASTROINTESTINAL ENDOSCOPY       OB History   None      Home Medications    Prior to Admission medications   Medication Sig Start Date End Date Taking? Authorizing Provider  aspirin 81 MG tablet Take 81 mg by mouth every morning.     [provider]  Calcium Carbonate-Vitamin D (CALCIUM-VITAMIN D) 500-200 MG-UNIT tablet Take 3 tablets by mouth every morning.    [provider]  cyclobenzaprine (FLEXERIL) 5 MG tablet TAKE 1/2 TO 1 TABLET BY MOUTH UP TO TWICE DAILY AS NEEDED FOR MUSCLE SPASMS 06/30/18   Wardell Honour, MD  diclofenac sodium (VOLTAREN) 1 % GEL Apply 2 g topically 4 (four) times daily. 05/25/18   Blanchie Dessert, MD  gabapentin (NEURONTIN) 800 MG tablet Take 1 tablet (800 mg total) by mouth  at bedtime. 06/20/18   Wardell Honour, MD  hydrochlorothiazide (MICROZIDE) 12.5 MG capsule take 1 capsule by mouth EVERY TUESDAY AND SATURDAY 12/10/17   Wardell Honour, MD  hydrocortisone 2.5 % ointment APPLY EXTERNALLY TO THE AFFECTED AREA TWICE DAILY 05/31/18   Wardell Honour, MD  hydroxypropyl methylcellulose (ISOPTO TEARS) 2.5 % ophthalmic solution Place 1 drop into both eyes 2 (two) times daily as needed for dry eyes.     [provider]  lactose free nutrition (BOOST) LIQD Take 237 mLs by mouth 2 (two) times daily between meals. Patient taking differently: Take 1 Container by mouth 2 (two) times daily as needed (nutritional).  03/22/16   [name], MD  lidocaine (LIDODERM) 5 % Place 1 patch onto the skin  daily. Remove & Discard patch within 12 hours or as directed by MD.  Apply to the right side of your neck where it hurts 05/25/18   Blanchie Dessert, MD  methocarbamol (ROBAXIN) 500 MG tablet Take 1 tablet (500 mg total) by mouth 2 (two) times daily. 05/25/18   Blanchie Dessert, MD  metoprolol succinate (TOPROL-XL) 50 MG 24 hr tablet take 1 tablet by mouth once daily WITH OR IMMEDIATELY FOLLOWING A MEAL 12/10/17   Wardell Honour, MD  pantoprazole (PROTONIX) 40 MG tablet TAKE 1 TABLET BY MOUTH EVERY DAY 07/23/18   Armbruster, Carlota Raspberry, MD  PREMARIN vaginal cream ONE-HALF APPLICATOR 2 TIMES A WEEK AS DIRECTED 11/09/17   [provider]  promethazine (PHENERGAN) 12.5 MG tablet take 1 tablet by mouth every 6 hours if needed for nausea and vomiting 11/26/17   Armbruster, Carlota Raspberry, MD  traMADol-acetaminophen (ULTRACET) 37.5-325 MG tablet Take 1 tablet by mouth every 6 (six) hours. 10/25/17   [provider]  triamcinolone cream (KENALOG) 0.1 % apply to affected area twice a day 09/24/17   [provider]  verapamil (CALAN-SR) 240 MG CR tablet Take 1 tablet (240 mg total) by mouth daily. 04/15/18   Wardell Honour, MD  vitamin C (ASCORBIC ACID) 500 MG tablet Take 500 mg by mouth daily.    [provider]    Family History Family History  Problem Relation Age of Onset  . Breast cancer Mother   . Heart attack Mother   . Heart disease Father   . Alcohol abuse Son   . Colon cancer Neg Hx   . Stomach cancer Neg Hx   . Rectal cancer Neg Hx     Social History Social History   Tobacco Use  . Smoking status: Never Smoker  . Smokeless tobacco: Never Used  Substance Use Topics  . Alcohol use: No    Alcohol/week: 0.0 standard drinks  . Drug use: No     Allergies   Patient has no known allergies.   Review of Systems Review of Systems  Constitutional: Negative for fever.  Musculoskeletal: Positive for arthralgias.     Physical Exam Updated Vital Signs BP (!)  159/82 (BP Location: Right Arm)   Pulse 91   Temp 99.8 F (37.7 C) (Oral)   Resp 18   SpO2 95%   Physical Exam  Constitutional: She appears well-developed and well-nourished.  HENT:  Head: Normocephalic and atraumatic.  Eyes: Pupils are equal, round, and reactive to light.  Cardiovascular: Normal rate.  Pulmonary/Chest: Breath sounds normal.  Musculoskeletal:       Left shoulder: She exhibits tenderness and pain. She exhibits no swelling, no deformity and no laceration.  Arms: Tenderness subacromial space  Tenderness lateral deltoid without erythema, warmth, induration, fluctuance  Skin: Skin is warm, dry and intact. No bruising and no rash noted. No erythema.     ED Treatments / Results  Labs (all labs ordered are listed, but only abnormal results are displayed) Labs Reviewed - No data to display  EKG None  Radiology Dg Shoulder Left  Result Date: 08/24/2018 CLINICAL DATA:  84 year old female with left shoulder pain. EXAM: LEFT SHOULDER - 2+ VIEW COMPARISON:  Left shoulder radiograph dated 09/14/2016 FINDINGS: There is no acute fracture or dislocation. The bones are osteopenic. There is severe arthritic changes of the left shoulder. There is prominence of the soft tissues of the lateral aspect of the shoulder concerning for subacromial or subdeltoid bursitis. Clinical correlation is recommended. IMPRESSION: 1. No acute fracture or dislocation. 2. Findings concerning for bursitis. Electronically Signed   By: Anner Crete M.D.   On: 08/24/2018 06:01    Procedures Procedures (including critical care time)  Medications Ordered in ED Medications  acetaminophen (TYLENOL) tablet 650 mg (650 mg Oral Given 08/24/18 0732)     Initial Impression / Assessment and Plan / ED Course  I have reviewed the triage vital signs and the nursing notes.  Pertinent labs & imaging results that were available during my care of the patient were reviewed by me and considered in my  medical decision making (see chart for details).     Patient presents to the ER for evaluation of arm pain.  Patient received influenza vaccination earlier in the day.  Site appears normal, no signs of cellulitis.  X-ray does not show any radiopaque foreign body.  She had tenderness over the deltoid where the shot presumably was, but also had some subacromial tenderness.  Treat with rest and analgesia, follow-up with PCP.  Final Clinical Impressions(s) / ED Diagnoses   Final diagnoses:  Acute shoulder bursitis, left    ED Discharge Orders    None       Orpah Greek, MD 08/25/18 2359

## 2018-08-24 NOTE — ED Notes (Signed)
Pt given ice pack

## 2018-08-26 ENCOUNTER — Other Ambulatory Visit: Payer: Self-pay | Admitting: Family Medicine

## 2018-09-04 ENCOUNTER — Ambulatory Visit (INDEPENDENT_AMBULATORY_CARE_PROVIDER_SITE_OTHER): Payer: Medicare Other | Admitting: Family Medicine

## 2018-09-04 ENCOUNTER — Other Ambulatory Visit: Payer: Self-pay

## 2018-09-04 ENCOUNTER — Encounter: Payer: Self-pay | Admitting: Family Medicine

## 2018-09-04 VITALS — BP 116/71 | HR 100 | Temp 98.3°F | Resp 17 | Ht 59.0 in | Wt 112.4 lb

## 2018-09-04 DIAGNOSIS — M5030 Other cervical disc degeneration, unspecified cervical region: Secondary | ICD-10-CM

## 2018-09-04 DIAGNOSIS — R21 Rash and other nonspecific skin eruption: Secondary | ICD-10-CM | POA: Diagnosis not present

## 2018-09-04 DIAGNOSIS — R300 Dysuria: Secondary | ICD-10-CM

## 2018-09-04 DIAGNOSIS — I2510 Atherosclerotic heart disease of native coronary artery without angina pectoris: Secondary | ICD-10-CM

## 2018-09-04 DIAGNOSIS — N390 Urinary tract infection, site not specified: Secondary | ICD-10-CM

## 2018-09-04 DIAGNOSIS — Z9181 History of falling: Secondary | ICD-10-CM | POA: Diagnosis not present

## 2018-09-04 DIAGNOSIS — E041 Nontoxic single thyroid nodule: Secondary | ICD-10-CM

## 2018-09-04 LAB — POCT URINALYSIS DIP (MANUAL ENTRY)
Bilirubin, UA: NEGATIVE
Blood, UA: NEGATIVE
GLUCOSE UA: NEGATIVE mg/dL
Ketones, POC UA: NEGATIVE mg/dL
Nitrite, UA: POSITIVE — AB
Protein Ur, POC: NEGATIVE mg/dL
Spec Grav, UA: 1.015 (ref 1.010–1.025)
UROBILINOGEN UA: 0.2 U/dL
pH, UA: 7 (ref 5.0–8.0)

## 2018-09-04 MED ORDER — PREMARIN 0.625 MG/GM VA CREA: TOPICAL_CREAM | VAGINAL | 0 refills | Status: DC

## 2018-09-04 MED ORDER — TRIAMCINOLONE ACETONIDE 0.1 % EX CREA: TOPICAL_CREAM | Freq: Two times a day (BID) | CUTANEOUS | 1 refills | Status: DC

## 2018-09-04 MED ORDER — CIPROFLOXACIN HCL 250 MG PO TABS: 250.0000 mg | ORAL_TABLET | Freq: Two times a day (BID) | ORAL | 0 refills | Status: DC

## 2018-09-04 NOTE — Patient Instructions (Addendum)
If you have lab work done today you will be contacted with your lab results within the next 2 weeks.  If you have not heard from [place] then please contact us. The fastest way to get your results is to register for My Chart.   IF you received an x-ray today, you will receive an invoice from [facility]. Please contact [facility] at [phone] with questions or concerns regarding your invoice.   IF you received labwork today, you will receive an invoice from [facility]. Please contact [facility] at [phone] with questions or concerns regarding your invoice.   Our billing staff will not be able to assist you with questions regarding bills from these companies.  You will be contacted with the lab results as soon as they are available. The fastest way to get your results is to activate your My Chart account. Instructions are located on the last page of this paperwork. If you have not heard from [place] regarding the results in 2 weeks, please contact this office.     Rash A rash is a change in the color of the skin. A rash can also change the way your skin feels. There are many different conditions and factors that can cause a rash. Follow these instructions at home: Pay attention to any changes in your symptoms. Follow these instructions to help with your condition: Medicine Take or apply over-the-counter and prescription medicines only as told by your health care provider. These may include:  Corticosteroid cream.  Anti-itch lotions.  Oral antihistamines.  Skin Care  Apply cool compresses to the affected areas.  Try taking a bath with: ? Epsom salts. Follow the instructions on the packaging. You can get these at your local pharmacy or grocery store. ? Baking soda. Pour a small amount into the bath as told by your health care provider. ? Colloidal oatmeal. Follow the instructions on the packaging. You can get this at your local pharmacy or grocery store.  Try  applying baking soda paste to your skin. Stir water into baking soda until it reaches a paste-like consistency.  Do not scratch or rub your skin.  Avoid covering the rash. Make sure the rash is exposed to air as much as possible. General instructions  Avoid hot showers or baths, which can make itching worse. A cold shower may help.  Avoid scented soaps, detergents, and perfumes. Use gentle soaps, detergents, perfumes, and other cosmetic products.  Avoid any substance that causes your rash. Keep a journal to help track what causes your rash. Write down: ? What you eat. ? What cosmetic products you use. ? What you drink. ? What you wear. This includes jewelry.  Keep all follow-up visits as told by your health care provider. This is important. Contact a health care provider if:  You sweat at night.  You lose weight.  You urinate more than normal.  You feel weak.  You vomit.  Your skin or the whites of your eyes look yellow (jaundice).  Your skin: ? Tingles. ? Is numb.  Your rash: ? Does not go away after several days. ? Gets worse.  You are: ? Unusually thirsty. ? More tired than normal.  You have: ? New symptoms. ? Pain in your abdomen. ? A fever. ? Diarrhea. Get help right away if:  You develop a rash that covers all or most of your body. The rash may or may not be painful.  You develop blisters that: ? Are on top of  the rash. ? Grow larger or grow together. ? Are painful. ? Are inside your nose or mouth.  You develop a rash that: ? Looks like purple pinprick-sized spots all over your body. ? Has a "bull's eye" or looks like a target. ? Is not related to sun exposure, is red and painful, and causes your skin to peel. This information is not intended to replace advice given to you by your health care provider. Make sure you discuss any questions you have with your health care provider. Document Released: 12/01/2002 Document Revised: 05/16/2016 Document  Reviewed: 04/28/2015 Elsevier Interactive Patient Education  2018 [employer].

## 2018-09-04 NOTE — Progress Notes (Signed)
Chief Complaint  Patient presents with  . transition of care/former smith pt  . Vaginal Itching    HPI  Skin rash She works in the yard And notice a week now that she has been having some itching in the lower inner leg that itches She states that she does not put anything on it She states that she put hot water on it  Acute uti She reports that she has been having urinary symptoms She states that it burns with urination She has this all the time She uses permarin in the vagina It helps with the dryness and itching in her vagina  Dementia DDD of cervical spine She has dementia and reports that she was living with her son who was her only child and her care giver She states that she is now very fearful because she lives alone and cannot cook for herself Due to osteoporosis and arthritis she cannot stand up straight and is afraid she might burn the house down if she cooks She states that she locks up the house because she is afraid of getting shot It is a one level home but right now she has to take stairs to get in it and is using a walker Her son used to help her She is concerned about her living situation  Thyroid nodule She wanted to know what she should do about her thyroid nodule now that Dr. Tamala Julian is gone She denies trouble breathing or swallowing Lab Results  Component Value Date   TSH 1.470 04/15/2018    Past Medical History:  Diagnosis Date  . Abnormal finding on Pap smear, ASCUS   . Acute renal insufficiency   . Amputation finger    index  . Barrett's esophagus   . CAD (coronary artery disease)   . Dementia   . Diverticulosis   . Elevated BP   . Foot deformity   . GERD (gastroesophageal reflux disease)   . Glaucoma   . H/O: GI bleed   . HSV-2 (herpes simplex virus 2) infection   . Hyperglycemia   . Hypertension   . Migraine   . Osteoporosis    worsening  . Spondylosis     Current Outpatient Medications  Medication Sig Dispense Refill  .  aspirin 81 MG tablet Take 81 mg by mouth every morning.     . Calcium Carbonate-Vitamin D (CALCIUM-VITAMIN D) 500-200 MG-UNIT tablet Take 3 tablets by mouth every morning.    . cyclobenzaprine (FLEXERIL) 5 MG tablet TAKE 1/2 TO 1 TABLET BY MOUTH UP TO TWICE DAILY AS NEEDED FOR MUSCLE SPASMS 30 tablet 0  . gabapentin (NEURONTIN) 800 MG tablet Take 1 tablet (800 mg total) by mouth at bedtime. 90 tablet 1  . hydrochlorothiazide (MICROZIDE) 12.5 MG capsule TAKE 1 CAPSULE BY MOUTH EVERY TUESDAY AND SATURDAY 90 capsule 0  . lactose free nutrition (BOOST) LIQD Take 237 mLs by mouth 2 (two) times daily between meals. (Patient taking differently: Take 1 Container by mouth 2 (two) times daily as needed (nutritional). ) 60 Can 11  . lidocaine (LIDODERM) 5 % Place 1 patch onto the skin daily. Remove & Discard patch within 12 hours or as directed by MD.  Apply to the right side of your neck where it hurts 30 patch 0  . methocarbamol (ROBAXIN) 500 MG tablet Take 1 tablet (500 mg total) by mouth 2 (two) times daily. 20 tablet 0  . metoprolol succinate (TOPROL-XL) 50 MG 24 hr tablet take 1 tablet by mouth  once daily WITH OR IMMEDIATELY FOLLOWING A MEAL 90 tablet 3  . pantoprazole (PROTONIX) 40 MG tablet TAKE 1 TABLET BY MOUTH EVERY DAY 30 tablet 5  . PREMARIN vaginal cream ONE-HALF APPLICATOR 2 TIMES A WEEK AS DIRECTED 30 g 0  . triamcinolone cream (KENALOG) 0.1 % Apply topically 2 (two) times daily. 30 g 1  . verapamil (CALAN-SR) 240 MG CR tablet Take 1 tablet (240 mg total) by mouth daily. 90 tablet 1  . vitamin C (ASCORBIC ACID) 500 MG tablet Take 500 mg by mouth daily.    . ciprofloxacin (CIPRO) 250 MG tablet Take 1 tablet (250 mg total) by mouth 2 (two) times daily. 6 tablet 0  . FLUAD 0.5 ML SUSY ADM 0.5ML IM UTD  0  . hydrocortisone 2.5 % ointment APPLY EXTERNALLY TO THE AFFECTED AREA TWICE DAILY 28.35 g 0  . hydroxypropyl methylcellulose (ISOPTO TEARS) 2.5 % ophthalmic solution Place 1 drop into both eyes 2  (two) times daily as needed for dry eyes.     . Influenza Vac A&B Surf Ant Adj (FLUAD) 0.5 ML SUSY Fluad 2019-20 42yr up(PF)45 mcg(15 mcgx3)/0.5 mL intramuscular syringe  ADM 0.5ML IM UTD    . nystatin cream (MYCOSTATIN) APP EXT AA BID FOR 10 DAYS PRN  1  . promethazine (PHENERGAN) 12.5 MG tablet take 1 tablet by mouth every 6 hours if needed for nausea and vomiting 30 tablet 0  . traMADol-acetaminophen (ULTRACET) 37.5-325 MG tablet Take 1 tablet by mouth every 6 (six) hours.  0   No current facility-administered medications for this visit.     Allergies: No Known Allergies  Past Surgical History:  Procedure Laterality Date  . AMPUTATION FINGER / THUMB     secondary to osteomyelitis  . CATARACT EXTRACTION, BILATERAL Bilateral 12/25/2016  . COLONOSCOPY    . ESOPHAGOGASTRODUODENOSCOPY N/A 10/22/2016   Procedure: ESOPHAGOGASTRODUODENOSCOPY (EGD);  Surgeon: Doran Stabler, MD;  Location[facility] ENDOSCOPY;  Service: Endoscopy;  Laterality: N/A;  . FOOT SURGERY     right  . PARTIAL HYSTERECTOMY    . UPPER GASTROINTESTINAL ENDOSCOPY      Social History   Socioeconomic History  . Marital status: Single    Spouse name: Not on file  . Number of children: 1  . Years of education: Not on file  . Highest education level: 12th grade  Occupational History    Employer: RETIRED  Social Needs  . Financial resource strain: Not on file  . Food insecurity:    Worry: Not on file    Inability: Not on file  . Transportation needs:    Medical: Not on file    Non-medical: Not on file  Tobacco Use  . Smoking status: Never Smoker  . Smokeless tobacco: Never Used  Substance and Sexual Activity  . Alcohol use: No    Alcohol/week: 0.0 standard drinks  . Drug use: No  . Sexual activity: Never    Birth control/protection: Post-menopausal  Lifestyle  . Physical activity:    Days per week: Not on file    Minutes per session: Not on file  . Stress: Not on file  Relationships  . Social  connections:    Talks on phone: Not on file    Gets together: Not on file    Attends religious service: Not on file    Active member of club or organization: Not on file    Attends meetings of clubs or organizations: Not on file    Relationship status: Not  on file  Other Topics Concern  . Not on file  Social History Narrative   Marital status: divorced.      Children: 1 son; no grandchildren      Lives: with son      Employment: retired      Tobacco: none      Alcohol: none      Drugs: none       Exercise: none       ADLs:  Independent with ADLs.  Quit driving age 60.  Medical CNA cleans house; makes breakfast; patient pays bills.      Advanced Directives:   YES.  HCPOA:  Son/Isaac Bowman.  FULL CODE; no prolonged measures.    Family History  Problem Relation Age of Onset  . Breast cancer Mother   . Heart attack Mother   . Heart disease Father   . Alcohol abuse Son   . Colon cancer Neg Hx   . Stomach cancer Neg Hx   . Rectal cancer Neg Hx      Review of Systems  Constitutional: Negative for chills, fever and weight loss.  HENT: Negative for congestion, ear discharge and nosebleeds.   Eyes: Negative for blurred vision and double vision.  Respiratory: Negative for cough, shortness of breath and wheezing.   Cardiovascular: Negative for chest pain, palpitations and leg swelling.  Gastrointestinal: Negative for abdominal pain, nausea and vomiting.  Genitourinary: Positive for dysuria. Negative for urgency.  Musculoskeletal: Negative for back pain and joint pain.  Skin: Positive for itching and rash.  Neurological: Negative for dizziness, tingling and headaches.  Endo/Heme/Allergies: Negative for environmental allergies. Does not bruise/bleed easily.  Psychiatric/Behavioral: Positive for depression. The patient is nervous/anxious.      Objective: Vitals:   09/04/18 1455  BP: 116/71  Pulse: 100  Resp: 17  Temp: 98.3 F (36.8 C)  TempSrc: Oral  SpO2: 97%  Weight:  112 lb 6.4 oz (51 kg)  Height: 4\' 11"  (1.499 m)    Physical Exam  Constitutional: She is oriented to person, place, and time. She appears well-developed and well-nourished.  HENT:  Head: Normocephalic and atraumatic.  Eyes: Conjunctivae and EOM are normal.  Cardiovascular: Normal rate, regular rhythm and normal heart sounds.  No murmur heard. Pulmonary/Chest: Effort normal and breath sounds normal. No stridor. No respiratory distress. She has no wheezes.  Musculoskeletal:  Kyphosis,  Deformity of hands and feet due to arthritis Missing right index finger  Neurological: She is alert and oriented to person, place, and time.  Skin: Skin is warm. Capillary refill takes less than 2 seconds.  Psychiatric: Her behavior is normal. Judgment and thought content normal.    Assessment and Plan [name] was seen today for transition of care/former smith pt and vaginal itching.  Diagnoses and all orders for this visit:  Burning with urination -     POCT urinalysis dipstick -     Urine Culture  Skin rash- will resume topical steroid -     triamcinolone cream (KENALOG) 0.1 %; Apply topically 2 (two) times daily.  Acute UTI- will treat with Cipro -     Urine Culture  Thyroid nodule- reviewed US imaging, recommendation for follow up in one year  DDD (degenerative disc disease), cervical-  Placement is a concern and so is safety -     AMB Referral to [facility]  At high risk for injury related to fall -     AMB Referral to [facility]  Other  orders -     ciprofloxacin (CIPRO) 250 MG tablet; Take 1 tablet (250 mg total) by mouth 2 (two) times daily. -     PREMARIN vaginal cream; ONE-HALF APPLICATOR 2 TIMES A WEEK AS DIRECTED     Steaven Wholey A Noble Bodie

## 2018-09-06 LAB — URINE CULTURE

## 2018-09-07 ENCOUNTER — Telehealth: Payer: Self-pay | Admitting: Family Medicine

## 2018-09-07 NOTE — Telephone Encounter (Signed)
Received a fax regarding the  RX of : Premarin Vag Cream 0.625MG     The pt's plan does not cover this RX. They will provide a refill for 30 days or fill until pt reaches 30 days. Dr. Nolon Rod will need to provide a different RX if needed longer than 30 days.   Form placed in Dr. Nolon Rod box.

## 2018-09-09 ENCOUNTER — Telehealth: Payer: Self-pay | Admitting: Family Medicine

## 2018-09-09 NOTE — Telephone Encounter (Signed)
Copied from [place] [id number]. Topic: General - Other >> Sep 09, 2018 11:20 AM Judyann Munson wrote: Reason for CRM:  Patient Niece Starr Lake is calling to request some people for the patients, she is not on DPR , she stated the patient son issac bowman passed away on 09-13-18 and she is needing help with having a Fl2 form completed and what she would need to do to be placed on the [facility]. Mrs.johnson best contact number is [phone]. Please advise

## 2018-09-10 NOTE — Telephone Encounter (Signed)
Conversation routed to [employer].  Please see below for PA.

## 2018-09-10 NOTE — Telephone Encounter (Signed)
Please see note below. 

## 2018-09-11 NOTE — Telephone Encounter (Signed)
Patient called to request information on [facility] paperwork that is to be filled out by Dr Nolon Rod. Patient would like to be placed in an assisted living home. She was informed that she needed an evaluation for paperwork to be completed with a TB skin test. Patient was offered an appointment for 09/23/18 but I could not schedule due to same day block. She is in the process of trying to get a ride for the visit on that day. So if someone could schedule her for 09/23/18 at 3 p.m.. Evaluation plus TB skin test. Patient is waiting for a call back with this confirmed appointment.  Ph# [phone]  Niece in [place] in charge of patient [name] Ph# [phone]

## 2018-09-11 NOTE — Telephone Encounter (Signed)
Pt called back asking if she can come in on 9/28. Please advise.

## 2018-09-11 NOTE — Telephone Encounter (Signed)
Left message Best course is to have patient come in to be evaluated by dr. Nolon Rod

## 2018-09-12 NOTE — Telephone Encounter (Signed)
Pt states it is very urgent she get in on Sat 9/28. Pt has a ride this day, and very difficult to find someone to bring her.

## 2018-09-13 ENCOUNTER — Other Ambulatory Visit: Payer: Self-pay

## 2018-09-13 NOTE — Patient Outreach (Addendum)
[facility]) Care Management  09/13/2018  Mckinzi Eriksen 08/16/1933 [id number]  TELEPHONE SCREENING Referral date: 09/05/18 Referral source: primary MD  Referral reason: placement for patient / fall risk  Insurance: Medicare   Telephone call to patient regarding primary MD referral. HIPAA verified.  Explained reason for call.  Patient states she needs to find an assisted living place to stay. Patient states she lost her son on 08/25/18 from a heart attack.  She states, " things have been really rough for me. "  Patient reports her niece and nephew are helping her.  Starr Lake, niece, and [place], [place]. Patient states she does live alone and has an aide 5 days per week from 10am to 1 pm.. Patient states her niece lives in [place], [place], and her nephew lives her in [place] but he is out of the country for the next 2 weeks.  Patient gives verbal authorization to speak with her niece and nephew regarding any of her health information.  Patient states, " I'm not a sickly person."  She states she has high blood pressure and back problems.  She states, " I'm a little bent over."  Patient states she thinks it will be beneficial for her if she went to an assisted living place to stay.  Patient states she does not have anyone to talk to since her son has died. Patient denies any falls.  She states she uses a cane to walk with.  RNCM discussed and offered [facility] care management services. Patient verbally agreed to follow up with [facility] social worker.  Patient states she has a follow up visit with her primary MD on 10/16/18.  RNCm gave patient name and contact phone number for [facility] management.   PLAN: RNCM will refer patient to Education officer, museum.   Quinn Plowman RN,BSN,CCM [facility] Telephonic  [phone]

## 2018-09-16 NOTE — Telephone Encounter (Signed)
Pt states she received a letter in the mail from [employer] that states: "we want to tell you that [employer] choice has provided you with a temporary supply of Premarin Vag Cream 0.625MG .  It is not included in her formulary and they need additional information." Pt states [employer] can be reached at [phone]. Pt can be reached at [phone]

## 2018-09-17 ENCOUNTER — Encounter: Payer: Self-pay | Admitting: *Deleted

## 2018-09-17 ENCOUNTER — Other Ambulatory Visit: Payer: Self-pay | Admitting: *Deleted

## 2018-09-17 ENCOUNTER — Encounter: Payer: Self-pay | Admitting: Family Medicine

## 2018-09-17 NOTE — Patient Outreach (Signed)
[facility]) Care Management  09/17/2018  Tanna Loeffler [birth year] [id number]   CSW was able to make initial contact with patient today to perform phone assessment, as well as assess and assist with social work needs and services.  CSW introduced self, explained role and types of services provided through [facility] ([facility]).  CSW further explained to patient that CSW works with patient's Telephonic RNCM, also with [facility], Quinn Plowman. CSW then explained the reason for the call, indicating that Mrs. Nyoka Cowden thought that patient would benefit from social work services and resources to assist with long-term care placement arrangements in an assisted living facility.  CSW obtained two HIPAA compliant identifiers from patient, which included patient's name and date of birth. Patient asked a lot of appropriate questions with regards to the difference between independent living versus assisted living versus skilled level of care.  CSW agreed to meet with patient for an initial home visit to thoroughly discuss the entire placement process.  During this home visit, CSW will provide patient with a list of assisted living facilities in [facility], as she believes that assisted living would be the most appropriate level of care for her at this time.  Patient encouraged CSW to contact her niece, Starr Lake [phone]), currently residing in [place], [place], to see if she would like to be available for the meeting.  Patient agreed to contact her nephew, Sung Amabile ([phone]) to invite him to the meeting. Patient talked a great deal about her deceased son, who passed away on 09-06-18 of a heart attack.  Patient became tearful on the phone with CSW, for which CSW provided counseling and supportive services, as well as Cognitive Behavioral Therapy.  Patient indicated that her son was her only child and that she deeply regrets  not having had more children.  CSW inquired as to whether or not patient would be interested in receiving counseling and supportive services through [facility] or another agency.  Patient denied, indicating that she would just have to learn to deal with it the best way she knows how.  CSW then asked patient if she would be interested in attending a Grief and Loss Support Group through [facility].  Again, patient denied, indicating that she is going to rely on her "Faith and the good Lord to pull her through". CSW will meet with patient for the initial home visit on Tuesday, October 01, 2018 at 11:00AM.  CSW will discuss the placement process with patient, provide her with a list of assisted living facilities in [facility] and assess whether or not patient is interested in receiving counseling and supportive services for symptoms of depression.  Patient reported that her niece and nephew are very supportive of her, but she misses the companionship that she had with her son, especially since they lived together.  Patient realizes that she does not wish to live alone and wants to be placed in an assisted living facility where she can socialize with the other residents.  CSW agreed to assist patient in finding a permanent place of residence in a facility of choice. [facility] CM Care Plan Problem One     Most Recent Value  Care Plan Problem One  Patient is requesting assistance with obtaining a higher level of care.  Role Documenting the Problem One  Clinical Social Worker  Care Plan for Problem One  Active  [facility] Long Term Goal  Patient will have 24 hour care and supervision, in an assisted living facility of choice, within the next 45 days.  [facility] Long Term Goal Start Date  09/17/18  Interventions for Problem One Long Term Goal  CSW will assist patient with initiating long-term care placement into an assisted living facility.  [facility] CM Short Term Goal #1   Patient will review the list  of assisted living facilities in [facility] that [facility] provides to her during the initial home visit, within the next two weeks.  [facility] CM Short Term Goal #1 Start Date  09/17/18  Interventions for Short Term Goal #1  CSW will meet with patient for the initial home visit on Tuesday, October 01, 2018 at 11:00AM.  [facility] CM Short Term Goal #2   Patient will go with niece and/or nephew to tour various assisted living facilites of choice, for possible long-term care placement, within the next three weeks.  [facility] CM Short Term Goal #2 Start Date  09/17/18  Interventions for Short Term Goal #2  CSW will contact patient's primary care physician to request a completed and signed FL-2 Form for placement purposes.    Nat Christen, BSW, MSW, LCSW  Licensed Education officer, [facility]  Mailing [place] [address] [place], [place] [address] Physical Address-[address], [place], [place] [address] Toll Free Main # [phone] Fax # [phone] Cell # [phone]  Office # [phone] [email or web address]

## 2018-09-18 ENCOUNTER — Ambulatory Visit: Payer: Self-pay | Admitting: *Deleted

## 2018-09-23 ENCOUNTER — Ambulatory Visit (INDEPENDENT_AMBULATORY_CARE_PROVIDER_SITE_OTHER): Payer: Medicare Other | Admitting: Family Medicine

## 2018-09-23 ENCOUNTER — Ambulatory Visit: Payer: Medicare Other | Admitting: Family Medicine

## 2018-09-23 VITALS — BP 138/68 | HR 91 | Temp 98.1°F | Resp 16 | Ht 59.0 in | Wt 118.0 lb

## 2018-09-23 DIAGNOSIS — H6122 Impacted cerumen, left ear: Secondary | ICD-10-CM | POA: Diagnosis not present

## 2018-09-23 DIAGNOSIS — R399 Unspecified symptoms and signs involving the genitourinary system: Secondary | ICD-10-CM

## 2018-09-23 DIAGNOSIS — I2510 Atherosclerotic heart disease of native coronary artery without angina pectoris: Secondary | ICD-10-CM

## 2018-09-23 DIAGNOSIS — Z029 Encounter for administrative examinations, unspecified: Secondary | ICD-10-CM | POA: Diagnosis not present

## 2018-09-23 LAB — POCT URINALYSIS DIP (MANUAL ENTRY)
BILIRUBIN UA: NEGATIVE
Blood, UA: NEGATIVE
GLUCOSE UA: NEGATIVE mg/dL
Ketones, POC UA: NEGATIVE mg/dL
Leukocytes, UA: NEGATIVE
NITRITE UA: NEGATIVE
Protein Ur, POC: NEGATIVE mg/dL
Spec Grav, UA: 1.015 (ref 1.010–1.025)
Urobilinogen, UA: 0.2 E.U./dL
pH, UA: 7 (ref 5.0–8.0)

## 2018-09-23 NOTE — Patient Instructions (Signed)
° ° ° °  If you have lab work done today you will be contacted with your lab results within the next 2 weeks.  If you have not heard from us then please contact us. The fastest way to get your results is to register for My Chart. ° ° °IF you received an x-ray today, you will receive an invoice from [facility]. Please contact [facility] at [phone] with questions or concerns regarding your invoice.  ° °IF you received labwork today, you will receive an invoice from [facility]. Please contact [facility] at [phone] with questions or concerns regarding your invoice.  ° °Our billing staff will not be able to assist you with questions regarding bills from these companies. ° °You will be contacted with the lab results as soon as they are available. The fastest way to get your results is to activate your My Chart account. Instructions are located on the last page of this paperwork. If you have not heard from us regarding the results in 2 weeks, please contact this office. °  ° ° ° °

## 2018-09-23 NOTE — Progress Notes (Signed)
Chief Complaint  Patient presents with  . Form Completion    pt wants paperwork filled out for assisted livinig    HPI  Pt is here for FL-2 with her home aid The home aid reports that the sketchy neighbors keep bothering her because they know she gets social security checks and that her son died She is not safe there  She also has difficulty hearing She reports increased wax build up  She completed her antibiotics for UTI and now feels like like she is still itching Insurance is no longer covering her premarin She is concerned that she might have another UTI  Past Medical History:  Diagnosis Date  . Abnormal finding on Pap smear, ASCUS   . Acute renal insufficiency   . Amputation finger    index  . Barrett's esophagus   . CAD (coronary artery disease)   . Dementia   . Diverticulosis   . Elevated BP   . Foot deformity   . GERD (gastroesophageal reflux disease)   . Glaucoma   . H/O: GI bleed   . HSV-2 (herpes simplex virus 2) infection   . Hyperglycemia   . Hypertension   . Migraine   . Osteoporosis    worsening  . Spondylosis     Current Outpatient Medications  Medication Sig Dispense Refill  . aspirin 81 MG tablet Take 81 mg by mouth every morning.     . Calcium Carbonate-Vitamin D (CALCIUM-VITAMIN D) 500-200 MG-UNIT tablet Take 3 tablets by mouth every morning.    . ciprofloxacin (CIPRO) 250 MG tablet Take 1 tablet (250 mg total) by mouth 2 (two) times daily. 6 tablet 0  . cyclobenzaprine (FLEXERIL) 5 MG tablet TAKE 1/2 TO 1 TABLET BY MOUTH UP TO TWICE DAILY AS NEEDED FOR MUSCLE SPASMS 30 tablet 0  . FLUAD 0.5 ML SUSY ADM 0.5ML IM UTD  0  . gabapentin (NEURONTIN) 800 MG tablet Take 1 tablet (800 mg total) by mouth at bedtime. 90 tablet 1  . hydrochlorothiazide (MICROZIDE) 12.5 MG capsule TAKE 1 CAPSULE BY MOUTH EVERY TUESDAY AND SATURDAY 90 capsule 0  . hydrocortisone 2.5 % ointment APPLY EXTERNALLY TO THE AFFECTED AREA TWICE DAILY 28.35 g 0  . hydroxypropyl  methylcellulose (ISOPTO TEARS) 2.5 % ophthalmic solution Place 1 drop into both eyes 2 (two) times daily as needed for dry eyes.     . Influenza Vac A&B Surf Ant Adj (FLUAD) 0.5 ML SUSY Fluad 2019-20 52yr up(PF)45 mcg(15 mcgx3)/0.5 mL intramuscular syringe  ADM 0.5ML IM UTD    . lactose free nutrition (BOOST) LIQD Take 237 mLs by mouth 2 (two) times daily between meals. (Patient taking differently: Take 1 Container by mouth 2 (two) times daily as needed (nutritional). ) 60 Can 11  . lidocaine (LIDODERM) 5 % Place 1 patch onto the skin daily. Remove & Discard patch within 12 hours or as directed by MD.  Apply to the right side of your neck where it hurts 30 patch 0  . methocarbamol (ROBAXIN) 500 MG tablet Take 1 tablet (500 mg total) by mouth 2 (two) times daily. 20 tablet 0  . metoprolol succinate (TOPROL-XL) 50 MG 24 hr tablet take 1 tablet by mouth once daily WITH OR IMMEDIATELY FOLLOWING A MEAL 90 tablet 3  . nystatin cream (MYCOSTATIN) APP EXT AA BID FOR 10 DAYS PRN  1  . pantoprazole (PROTONIX) 40 MG tablet TAKE 1 TABLET BY MOUTH EVERY DAY 30 tablet 5  . PREMARIN vaginal cream ONE-HALF APPLICATOR  2 TIMES A WEEK AS DIRECTED 30 g 0  . promethazine (PHENERGAN) 12.5 MG tablet take 1 tablet by mouth every 6 hours if needed for nausea and vomiting 30 tablet 0  . traMADol-acetaminophen (ULTRACET) 37.5-325 MG tablet Take 1 tablet by mouth every 6 (six) hours.  0  . triamcinolone cream (KENALOG) 0.1 % Apply topically 2 (two) times daily. 30 g 1  . verapamil (CALAN-SR) 240 MG CR tablet Take 1 tablet (240 mg total) by mouth daily. 90 tablet 1  . vitamin C (ASCORBIC ACID) 500 MG tablet Take 500 mg by mouth daily.     No current facility-administered medications for this visit.     Allergies: No Known Allergies  Past Surgical History:  Procedure Laterality Date  . AMPUTATION FINGER / THUMB     secondary to osteomyelitis  . CATARACT EXTRACTION, BILATERAL Bilateral 12/25/2016  . COLONOSCOPY    .  ESOPHAGOGASTRODUODENOSCOPY N/A 10/22/2016   Procedure: ESOPHAGOGASTRODUODENOSCOPY (EGD);  Surgeon: Doran Stabler, MD;  Location: [facility] ENDOSCOPY;  Service: Endoscopy;  Laterality: N/A;  . FOOT SURGERY     right  . PARTIAL HYSTERECTOMY    . UPPER GASTROINTESTINAL ENDOSCOPY      Social History   Socioeconomic History  . Marital status: Single    Spouse name: Not on file  . Number of children: 1  . Years of education: Not on file  . Highest education level: 12th grade  Occupational History    Employer: RETIRED  Social Needs  . Financial resource strain: Not on file  . Food insecurity:    Worry: Not on file    Inability: Not on file  . Transportation needs:    Medical: Not on file    Non-medical: Not on file  Tobacco Use  . Smoking status: Never Smoker  . Smokeless tobacco: Never Used  Substance and Sexual Activity  . Alcohol use: No    Alcohol/week: 0.0 standard drinks  . Drug use: No  . Sexual activity: Never    Birth control/protection: Post-menopausal  Lifestyle  . Physical activity:    Days per week: Not on file    Minutes per session: Not on file  . Stress: Not on file  Relationships  . Social connections:    Talks on phone: Not on file    Gets together: Not on file    Attends religious service: Not on file    Active member of club or organization: Not on file    Attends meetings of clubs or organizations: Not on file    Relationship status: Not on file  Other Topics Concern  . Not on file  Social History Narrative   Marital status: divorced.      Children: 1 son; no grandchildren      Lives: with son      Employment: retired      Tobacco: none      Alcohol: none      Drugs: none       Exercise: none       ADLs:  Independent with ADLs.  Quit driving age 51.  Medical CNA cleans house; makes breakfast; patient pays bills.      Advanced Directives:   YES.  HCPOA:  Son/Isaac Bowman.  FULL CODE; no prolonged measures.    Family History  Problem Relation  Age of Onset  . Breast cancer Mother   . Heart attack Mother   . Heart disease Father   . Alcohol abuse Son   .  Colon cancer Neg Hx   . Stomach cancer Neg Hx   . Rectal cancer Neg Hx      ROS Review of Systems See HPI Constitution: No fevers or chills No malaise No diaphoresis Skin: No rash or itching Eyes: no blurry vision, no double vision GU: SEE HPI, no dysuria or hematuria Neuro: no dizziness or headaches All others reviewed and negative   Objective: Vitals:   09/23/18 1122  BP: 138/68  Pulse: 91  Resp: 16  Temp: 98.1 F (36.7 C)  TempSrc: Oral  SpO2: 98%  Weight: 118 lb (53.5 kg)  Height: 4\' 11"  (1.499 m)    Physical Exam  Constitutional: She is oriented to person, place, and time. She appears well-developed and well-nourished.  HENT:  Head: Normocephalic and atraumatic.  Increased wax on the right, impacted cerumen on the left  Eyes: Conjunctivae and EOM are normal.  Cardiovascular: Normal rate, regular rhythm and normal heart sounds.  No murmur heard. Pulmonary/Chest: Effort normal and breath sounds normal. No stridor. No respiratory distress.  Neurological: She is alert and oriented to person, place, and time.    Assessment and Plan Kaneisha was seen today for form completion.  Diagnoses and all orders for this visit:  Urinary symptom or sign- no UTI on urine dip -     POCT urinalysis dipstick  Impacted cerumen of left ear- resolved with ear lavage -     Ear wax removal  Administrative encounter- completed FL-2 form for assisted living based on her vascular dementia and grievign      [name] A [name]

## 2018-09-25 ENCOUNTER — Ambulatory Visit: Payer: Medicare Other | Admitting: Family Medicine

## 2018-09-26 ENCOUNTER — Other Ambulatory Visit: Payer: Self-pay | Admitting: Family Medicine

## 2018-09-26 MED ORDER — PREMARIN 0.625 MG/GM VA CREA: TOPICAL_CREAM | VAGINAL | 0 refills | Status: DC

## 2018-09-26 NOTE — Telephone Encounter (Signed)
Requested Prescriptions  Pending Prescriptions Disp Refills  . PREMARIN vaginal cream 30 g 0    Sig: ONE-HALF APPLICATOR 2 TIMES A WEEK AS DIRECTED     OB/GYN:  Estrogens Failed - 09/26/2018  3:26 PM      Failed - Mammogram is up-to-date per Health Maintenance      Passed - Last BP in normal range    BP Readings from Last 1 Encounters:  09/23/18 138/68         Passed - Valid encounter within last 12 months    Recent Outpatient Visits          3 days ago Urinary symptom or sign   Primary Care at [facility], Arlie Solomons, MD   3 weeks ago Burning with urination   Primary Care at [facility], Suarez, MD   5 months ago Acute non intractable tension-type headache   Primary Care at [facility], Renette Butters, MD   9 months ago Medicare annual wellness visit, subsequent   Primary Care at [facility], Renette Butters, MD   10 months ago Decreased hearing of both ears   Primary Care at [facility], Reather Laurence, PA-C      Future Appointments            In 5 months Forrest Moron, MD Primary Care at [place], [facility]

## 2018-09-26 NOTE — Telephone Encounter (Signed)
Copied from [place] [id number]. Topic: Quick Communication - Rx Refill/Question >> Sep 26, 2018  3:17 PM Leward Quan A wrote: Medication: PREMARIN vaginal cream  Has the patient contacted their pharmacy?  Yes   Preferred Pharmacy (with phone number or street name): [facility] [phone] - [place], [place] - [address] AT [address] [phone] (Phone) [phone] (Fax)    Agent: Please be advised that RX refills may take up to 3 business days. We ask that you follow-up with your pharmacy.

## 2018-10-01 ENCOUNTER — Encounter: Payer: Self-pay | Admitting: *Deleted

## 2018-10-01 ENCOUNTER — Other Ambulatory Visit: Payer: Self-pay | Admitting: *Deleted

## 2018-10-01 NOTE — Patient Outreach (Signed)
[facility]) Care Management  10/01/2018  [name] 1933/05/07 [id number]   CSW drove out to patient's home today, along with [name], new Telephonic Nurse Case Manager, also with [facility], to try and perform the initial home visit; however, no one was available at the time of CSW's arrival.  CSW knocked on patient's door, rang the doorbell and tried calling patient on the phone, without success.  CSW then tried calling patient's niece, [name], who was also supposed to be in attendance at the meeting, but she too, was unavailable.  HIPAA compliant messages were left for both patient and [name].  CSW is currently awaiting a return call.  CSW will make a second outreach attempt within the next 3-4 business days, if a return call is not received from the patient or [name] in the meantime.  CSW will also mail an Outreach Letter to patient's home requesting that patient contact CSW if patient is interested in receiving social work services through [place] with Scientist, clinical (histocompatibility and immunogenetics). Nat Christen, BSW, MSW, LCSW  Licensed Education officer, environmental Health System  Mailing [place] [address], [place], [place] [address] Physical Address-[address], [place], [place] [address] Toll Free Main # [phone] Fax # [phone] Cell # [phone]  Office # [phone] [email or web address]

## 2018-10-04 ENCOUNTER — Encounter: Payer: Self-pay | Admitting: *Deleted

## 2018-10-04 ENCOUNTER — Other Ambulatory Visit: Payer: Self-pay | Admitting: *Deleted

## 2018-10-04 NOTE — Patient Outreach (Signed)
[facility]) Care Management  10/04/2018  Nolah Krenzer [birth year] [id number]   CSW made a second attempt to try and contact patient today to perform phone assessment, as well as assess and assist with social work needs and services, without success.  A HIPAA compliant message was left for patient on voicemail.  CSW continues to await a return call.  CSW will make a third and final outreach attempt within the next 3-4 business days, if a return call is not received from patient in the meantime.  CSW will then proceed with case closure if a return call is not received from patient with a total of 10 business days, as required number of phone attempts will have been made and outreach letter mailed.  Nat Christen, BSW, MSW, LCSW  Licensed Education officer, environmental Health System  Mailing [place] [address] [place], [place] [address] Physical Address-[address], [place], [place] [address] Toll Free Main # [phone] Fax # [phone] Cell # [phone]  Office # [phone] [email or web address]

## 2018-10-06 ENCOUNTER — Other Ambulatory Visit: Payer: Self-pay | Admitting: Family Medicine

## 2018-10-07 ENCOUNTER — Telehealth: Payer: Self-pay | Admitting: Family Medicine

## 2018-10-07 NOTE — Telephone Encounter (Unsigned)
Copied from [place] [id number]. Topic: General - Other >> Oct 07, 2018  1:56 PM [name] A wrote: Reason for CRM: patient is inquiring on getting a back brace, she says she slouches down pretty far to the floor and she feels like that would help her out significantly. She would like to be referred to a specialist if possible. >> Oct 07, 2018  2:40 PM Cox, Melburn Hake, CMA wrote: This is not a [facility] pt. Routing to [employer].

## 2018-10-07 NOTE — Telephone Encounter (Signed)
PEC pt message sent to Dr. Nolon Rod Re: pt inquiring about back brace

## 2018-10-08 ENCOUNTER — Other Ambulatory Visit: Payer: Self-pay | Admitting: Family Medicine

## 2018-10-08 MED ORDER — CVS LUMBAR/BACK SUPPORT BRACE MISC: 1.0000 "application " | Freq: Every day | 1 refills | Status: DC

## 2018-10-08 NOTE — Telephone Encounter (Signed)
Patient is requesting a refill of the following medications: Requested Prescriptions   Pending Prescriptions Disp Refills  . cyclobenzaprine (FLEXERIL) 5 MG tablet [Pharmacy Med Name: CYCLOBENZAPRINE 5MG  TABLETS] 30 tablet 0    Sig: TAKE 1/2 TO 1 TABLET BY MOUTH UP TO TWICE DAILY AS NEEDED FOR MUSCLE SPASMS    Date of patient request: 10/06/2018 Last office visit: 09/23/2018 Date of last refill:06/30/2018 Last refill amount: 30 Follow up time period per chart:

## 2018-10-08 NOTE — Telephone Encounter (Signed)
Patient is requesting a refill of the following medications: Requested Prescriptions   Pending Prescriptions Disp Refills  . cyclobenzaprine (FLEXERIL) 5 MG tablet [Pharmacy Med Name: CYCLOBENZAPRINE 5MG  TABLETS] 30 tablet 0    Sig: TAKE 1/2 TO 1 TABLET BY MOUTH UP TO TWICE DAILY AS NEEDED FOR MUSCLE SPASMS    Date of patient request: 10/08/2018 Last office visit: 09/23/2018 Date of last refill: 06/30/2018 Last refill amount: 30 Follow up time period per chart:

## 2018-10-08 NOTE — Telephone Encounter (Signed)
Printed back brace. Will give to [facility] to send to the pharmacy

## 2018-10-08 NOTE — Telephone Encounter (Signed)
Back brace rx sent via fax to [facility] on [address] at [phone]. Dgaddy, CMA

## 2018-10-10 ENCOUNTER — Other Ambulatory Visit: Payer: Self-pay | Admitting: *Deleted

## 2018-10-10 ENCOUNTER — Ambulatory Visit: Payer: Self-pay | Admitting: *Deleted

## 2018-10-10 NOTE — Patient Outreach (Signed)
[facility]) Care Management  10/10/2018  [name] [birth year] [id number]    CSW made a third and final attempt to try and contact patient today to perform phone assessment, as well as assess and assist with social work needs and services, without success.  A HIPAA compliant message was left for patient on voicemail.  CSW is currently awaiting a return call.  CSW will proceed with case closure in two business days, if a return call is not received in the meantime, as required number of phone attempts have been made and an outreach letter was mailed to patient's home allowing 10 business days for a response. Nat Christen, BSW, MSW, LCSW  Licensed Education officer, environmental Health System  Mailing [place] [address], [place], [place] [address] Physical Address-[address], [place], [place] [address] Toll Free Main # [phone] Fax # [phone] Cell # [phone]  Office # [phone] [email or web address]

## 2018-10-11 ENCOUNTER — Other Ambulatory Visit: Payer: Self-pay | Admitting: Family Medicine

## 2018-10-11 ENCOUNTER — Telehealth: Payer: Self-pay | Admitting: Family Medicine

## 2018-10-11 NOTE — Telephone Encounter (Signed)
Copied from [place] [id number]. Topic: Quick Communication - See Telephone Encounter >> Oct 11, 2018  1:03 PM Hewitt Shorts wrote: PT is calling to see if Estrace can be called in instead of premarin pt insurance silver scripts does not cover it and it cost too much And pharmacy states the estrace will be covered   Best number [phone]

## 2018-10-11 NOTE — Telephone Encounter (Signed)
6 tabs of Cipro was ordered for this patient to take on 09/04/18. Attempted to call patient, no answer and not able to leave voice mail. Requested Prescriptions  Refused Prescriptions Disp Refills  . ciprofloxacin (CIPRO) 250 MG tablet [Pharmacy Med Name: CIPROFLOXACIN 250MG  TABLETS] 6 tablet 0    Sig: TAKE 1 TABLET(250 MG) BY MOUTH TWICE DAILY     Off-Protocol Failed - 10/11/2018 12:35 PM      Failed - Medication not assigned to a protocol, review manually.      Passed - Valid encounter within last 12 months    Recent Outpatient Visits          2 weeks ago Urinary symptom or sign   Primary Care at [facility], Arlie Solomons, MD   1 month ago Burning with urination   Primary Care at [facility], [place], MD   5 months ago Acute non intractable tension-type headache   Primary Care at [facility], Renette Butters, MD   10 months ago Medicare annual wellness visit, subsequent   Primary Care at [facility], Renette Butters, MD   10 months ago Decreased hearing of both ears   Primary Care at [facility], Reather Laurence, PA-C      Future Appointments            In 4 months Forrest Moron, MD Primary Care at [place], [facility]

## 2018-10-11 NOTE — Telephone Encounter (Signed)
Requested medication (s) are due for refill today: yes  Requested medication (s) are on the active medication list: yes  Last refill:  09/26/18  Future visit scheduled: yes 03/05/19      Requested Prescriptions  Pending Prescriptions Disp Refills   PREMARIN vaginal cream [Pharmacy Med Name: [place] 30GM] 30 g 0    Sig: INSERT 1/2 APPLICATORFUL VAGINALLY 2 TIMES A WEEK AS DIRECTED     OB/GYN:  Estrogens Failed - 10/11/2018  8:19 AM      Failed - Mammogram is up-to-date per Health Maintenance      Passed - Last BP in normal range    BP Readings from Last 1 Encounters:  09/23/18 138/68         Passed - Valid encounter within last 12 months    Recent Outpatient Visits          2 weeks ago Urinary symptom or sign   Primary Care at [facility], Arlie Solomons, MD   1 month ago Burning with urination   Primary Care at [facility], [place], MD   5 months ago Acute non intractable tension-type headache   Primary Care at [facility], Renette Butters, MD   10 months ago Medicare annual wellness visit, subsequent   Primary Care at [facility], Renette Butters, MD   10 months ago Decreased hearing of both ears   Primary Care at [facility], Reather Laurence, PA-C      Future Appointments            In 4 months Forrest Moron, MD Primary Care at [place], [facility]

## 2018-10-13 NOTE — Telephone Encounter (Signed)
Pt message sent to Dr. Nolon Rod Re: requests Estrace be sent in place of Premarin

## 2018-10-14 ENCOUNTER — Other Ambulatory Visit: Payer: Self-pay | Admitting: *Deleted

## 2018-10-14 ENCOUNTER — Telehealth: Payer: Self-pay | Admitting: Family Medicine

## 2018-10-14 ENCOUNTER — Encounter: Payer: Self-pay | Admitting: *Deleted

## 2018-10-14 ENCOUNTER — Ambulatory Visit: Payer: Self-pay | Admitting: *Deleted

## 2018-10-14 NOTE — Telephone Encounter (Signed)
Pt initially calling to ask what appt on 10/16/18 was regarding: "I was told I had an appt by my ride." During call with agent, pt stated "I feel like going to sleep and not waking up." Sent to NT. Pt states she "Would never do anything like that; against my religion."  "I'm just sad since my son died 09-19-18." No suicidal or homicidal ideation,denies physical symptoms. Again states "I would never hurt myself."  Pt made aware she had cancelled appt of 10/16/18; did not recall. Pt has multiple questions regarding paperwork for assisted living arrangements. Asking if all completed, if TB test required. Pt is hyper verbal, evasive historian.  Please advise: [phone]  Reason for Disposition . [1] Depression AND [2] NO worsening or change  Answer Assessment - Initial Assessment Questions 1. CONCERN: "What happened that made you call today?"     "I was told I have an appt on Wednesday and don't know what it is for." Also per agent pt states "I feel like not waking up." 2. DEPRESSION SYMPTOM SCREENING: "How are you feeling overall?" (e.g., decreased energy, increased sleeping or difficulty sleeping, difficulty concentrating, feelings of sadness, guilt, hopelessness, or worthlessness)    "Sad" 3. RISK OF HARM - SUICIDAL IDEATION:  "Do you ever have thoughts of hurting or killing yourself?"  (e.g., yes, no, no but preoccupation with thoughts about death)   - INTENT:  "Do you have thoughts of hurting or killing yourself right NOW?" (e.g., yes, no, N/A)   - PLAN: "Do you have a specific plan for how you would do this?" (e.g., gun, knife, overdose, no plan, N/A)     no 4. RISK OF HARM - HOMICIDAL IDEATION:  "Do you ever have thoughts of hurting or killing someone else?"  (e.g., yes, no, no but preoccupation with thoughts about death)   - INTENT:  "Do you have thoughts of hurting or killing someone right NOW?" (e.g., yes, no, N/A)   - PLAN: "Do you have a specific plan for how you would do this?" (e.g., gun,  knife, no plan, N/A)      no 5. FUNCTIONAL IMPAIRMENT: "How have things been going for you overall in your life? Have you had any more difficulties than usual doing your normal daily activities?"  (e.g., better, same, worse; self-care, school, work, interactions)     Son died 09/19/18 6. SUPPORT: "Who is with you now?" "Who do you live with?" "Do you have family or friends nearby who you can talk to?"      Niece 7. THERAPIST: "Do you have a counselor or therapist? Name?"     no 8. STRESSORS: "Has there been any new stress or recent changes in your life?"     Son's death 03-28-23. DRUG ABUSE/ALCOHOL: "Do you drink alcohol or use any illegal drugs?"      no 10. OTHER: "Do you have any other health or medical symptoms right now?" (e.g., fever)      no  Protocols used: DEPRESSION-A-AH

## 2018-10-14 NOTE — Telephone Encounter (Signed)
Copied from [place] [id number]. Topic: Quick Communication - See Telephone Encounter >> Oct 11, 2018  1:03 PM Hewitt Shorts wrote: PT is calling to see if Estrace can be called in instead of premarin pt insurance silver scripts does not cover it and it cost too much And pharmacy states the estrace will be covered   Best number [phone]

## 2018-10-14 NOTE — Telephone Encounter (Signed)
PLEASE SEE NOTE BELOW

## 2018-10-14 NOTE — Patient Outreach (Signed)
[facility]) Care Management  10/14/2018  [name] 1933-06-22 [id number]   CSW will perform a case closure on patient, due to inability to maintain phone contact, despite required number of phone attempts and outreach letter mailed to patient's house, allowing 10 business days for a response if patient is interested in continuing to receive social work services through [place] with [facility].  CSW will fax an update to patient's Primary Care Physician, Dr. Delia Chimes to ensure that they are aware of CSW's involvement with patient's plan of care.   Nat Christen, BSW, MSW, LCSW  Licensed Education officer, environmental Health System  Mailing [place] [address] [place], [place] [address] Physical Address-[address], [place], [place] [address] Toll Free Main # [phone] Fax # [phone] Cell # [phone]  Office # [phone] [email or web address]

## 2018-10-16 ENCOUNTER — Ambulatory Visit: Payer: Medicare Other | Admitting: Family Medicine

## 2018-10-16 ENCOUNTER — Ambulatory Visit (INDEPENDENT_AMBULATORY_CARE_PROVIDER_SITE_OTHER): Payer: Medicare Other | Admitting: Family Medicine

## 2018-10-16 ENCOUNTER — Encounter

## 2018-10-16 DIAGNOSIS — Z111 Encounter for screening for respiratory tuberculosis: Secondary | ICD-10-CM | POA: Diagnosis not present

## 2018-10-16 MED ORDER — ESTRADIOL 0.1 MG/GM VA CREA: 1.0000 | TOPICAL_CREAM | VAGINAL | 12 refills | Status: DC

## 2018-10-16 NOTE — Telephone Encounter (Signed)
Please notify the patient that I sent in the Estrace. It is to be used once a week.

## 2018-10-16 NOTE — Progress Notes (Signed)
  Tuberculosis Risk Questionnaire  1. No Were you born outside the [place] in one of the following parts of the world: Africa, Asia, Central America, South America or Eastern Europe?    2. No Have you traveled outside the [place] and lived for more than one month in one of the following parts of the world: Africa, Asia, [place], [place] America or Eastern [place]?    3. No Do you have a compromised immune system such as from any of the following conditions:HIV/AIDS, organ or bone marrow transplantation, diabetes, immunosuppressive medicines (e.g. Prednisone, Remicaide), leukemia, lymphoma, cancer of the head or neck, gastrectomy or jejunal bypass, end-stage renal disease (on dialysis), or silicosis?     4. No Have you ever or do you plan on working in: a residential care center, a health care facility, a jail or prison or homeless shelter?    5. No Have you ever: injected illegal drugs, used crack cocaine, lived in a homeless shelter  or been in jail or prison?     6. No Have you ever been exposed to anyone with infectious tuberculosis?  7. No Have you ever had a BCG vaccine? (BCG is a vaccine for tuberculosis  (TB) used in OTHER countries, NOT in the [place]).  8. No Have you ever been advised by a health care provider NOT to have a TB skin test?  9. No Have you ever had a POSITIVE TB skin test?  IF SO, when? n/a  IF SO, were you treated with INH? n/a  IF SO, where? n/a  Tuberculosis Symptom Questionnaire  Do you currently have any of the following symptoms?  1. No Unexplained cough lasting more than 3 weeks?   2. No Unexplained fever lasting more than 3 weeks.   3. No Night Sweats (sweating that leaves the bedclothes and sheets wet)     4. No Shortness of Breath   5. No Chest Pain   6. No Unintentional weight loss    7. No Unexplained fatigue (very tired for no reason)     

## 2018-10-17 NOTE — Telephone Encounter (Signed)
Spoke with pt.  Advised Estrace called to pharmacy - use 1 x week.

## 2018-10-18 ENCOUNTER — Ambulatory Visit (INDEPENDENT_AMBULATORY_CARE_PROVIDER_SITE_OTHER): Payer: Medicare Other | Admitting: Physician Assistant

## 2018-10-18 DIAGNOSIS — Z111 Encounter for screening for respiratory tuberculosis: Secondary | ICD-10-CM

## 2018-10-18 LAB — TB SKIN TEST
Induration: 0 mm
TB SKIN TEST: NEGATIVE

## 2018-10-23 ENCOUNTER — Telehealth: Payer: Self-pay | Admitting: Family Medicine

## 2018-10-23 ENCOUNTER — Other Ambulatory Visit: Payer: Self-pay | Admitting: *Deleted

## 2018-10-23 ENCOUNTER — Encounter: Payer: Self-pay | Admitting: *Deleted

## 2018-10-23 NOTE — Patient Outreach (Signed)
[facility]) Care Management  10/23/2018  [name] 10/15/1933 [id number]   CSW made an initial attempt to try and contact patient today to perform the initial phone assessment, as well as assess and assist with social work needs and services, without success.  A HIPAA compliant message was left for patient on voicemail.  CSW is currently awaiting a return call.  CSW will make a second outreach attempt within the next 3-4 business days, if a return call is not received from patient in the meantime.  CSW will also mail an Outreach Letter to patient's home requesting that patient contact CSW if patient is interested in receiving social work services through [place] with Scientist, clinical (histocompatibility and immunogenetics). Nat Christen, BSW, MSW, LCSW  Licensed Education officer, environmental Health System  Mailing [place] [address] [place], [place] [address] Physical Address-[address], [place], [place] [address] Toll Free Main # [phone] Fax # [phone] Cell # [phone]  Office # [phone] [email or web address]

## 2018-10-23 NOTE — Telephone Encounter (Signed)
Please advise  Copied from [place] [id number]. Topic: General - Other >> Oct 23, 2018  2:24 PM Virl Axe D wrote: Reason for CRM: Pt has been admitted to [facility]. Med list needs to be faxed over so that she will have medications. Fax [phone] Attention Mia / Also needing TB test Administer Date, Date Read, Lot #, Manufacturer, Expiring Date and a Nurse's signature. Please advise. Pt has no medication at Assisted Living.

## 2018-10-23 NOTE — Telephone Encounter (Signed)
Message sent to Medical Records to send info Message sent to Dr. Nolon Rod for [facility]

## 2018-10-24 NOTE — Telephone Encounter (Signed)
Faxed to # given and I got a confirmation.

## 2018-10-28 ENCOUNTER — Ambulatory Visit: Payer: Medicare Other | Admitting: Emergency Medicine

## 2018-10-29 ENCOUNTER — Other Ambulatory Visit: Payer: Self-pay | Admitting: *Deleted

## 2018-10-29 ENCOUNTER — Ambulatory Visit: Payer: Self-pay | Admitting: *Deleted

## 2018-10-29 DIAGNOSIS — G894 Chronic pain syndrome: Secondary | ICD-10-CM | POA: Diagnosis not present

## 2018-10-29 DIAGNOSIS — F0390 Unspecified dementia without behavioral disturbance: Secondary | ICD-10-CM | POA: Diagnosis not present

## 2018-10-29 DIAGNOSIS — K219 Gastro-esophageal reflux disease without esophagitis: Secondary | ICD-10-CM | POA: Diagnosis not present

## 2018-10-29 DIAGNOSIS — I2510 Atherosclerotic heart disease of native coronary artery without angina pectoris: Secondary | ICD-10-CM | POA: Diagnosis not present

## 2018-10-29 NOTE — Patient Outreach (Signed)
[facility]) Care Management  10/29/2018  [name] 1933-02-21 [id number]   CSW made a second attempt to try and contact patient today to perform phone assessment, as well as assess and assist with social work needs and services, without success.  A HIPAA compliant message was left for patient on voicemail.  CSW continues to await a return call.  CSW will make a third and final outreach attempt within the next 3-4 business days, if a return call is not received from patient in the meantime.  CSW will then proceed with case closure if a return call is not received from patient with a total of 10 business days, as required number of phone attempts will have been made and outreach letter mailed.  Nat Christen, BSW, MSW, LCSW  Licensed Education officer, environmental Health System  Mailing [place] [address], [place], [place] [address] Physical Address-[address], [place], [place] [address] Toll Free Main # [phone] Fax # [phone] Cell # [phone]  Office # [phone] [email or web address]

## 2018-10-30 ENCOUNTER — Ambulatory Visit: Payer: Medicare Other | Admitting: Family Medicine

## 2018-10-31 DIAGNOSIS — Z79899 Other long term (current) drug therapy: Secondary | ICD-10-CM | POA: Diagnosis not present

## 2018-11-04 ENCOUNTER — Other Ambulatory Visit: Payer: Self-pay | Admitting: *Deleted

## 2018-11-04 NOTE — Patient Outreach (Signed)
[facility]) Care Management  11/04/2018  [name] 01-06-1933 [id number]    CSW made a third and final attempt to try and contact patient today to perform phone assessment, as well as assess and assist with social work needs and services, without success.  A HIPAA compliant message was left for patient on voicemail.  CSW is currently awaiting a return call.  CSW will proceed with case closure in two business days, if a return call is not received in the meantime, as required number of phone attempts have been made and an outreach letter was mailed to patient's home allowing 10 business days for a response. Nat Christen, BSW, MSW, LCSW  Licensed Education officer, environmental Health System  Mailing [place] [address], [place], [place] [address] Physical Address-[address], [place],  [address] Toll Free Main # [phone] Fax # [phone] Cell # [phone]  Office # [phone] [email or web address]

## 2018-11-06 ENCOUNTER — Encounter: Payer: Self-pay | Admitting: *Deleted

## 2018-11-06 ENCOUNTER — Other Ambulatory Visit: Payer: Self-pay | Admitting: *Deleted

## 2018-11-06 NOTE — Patient Outreach (Signed)
[facility]) Care Management  11/06/2018  [name] September 23, 1933 [id number]   CSW will perform a case closure on patient, due to inability to establish initial phone contact with patient, despite required number of phone attempts made and outreach letter mailed to patient's home allowing 10 business days for a response if patient is interested in receiving social work services through [facility] with [facility].  CSW will fax an update to patient's Primary Care Physician, Dr. Delia Chimes to ensure that they are aware of CSW's involvement with patient's plan of care.   Nat Christen, BSW, MSW, LCSW  Licensed Education officer, environmental Health System  Mailing [place] [address], [place], [place] [address] Physical Address-[address], [place], [place] [address] Toll Free Main # [phone] Fax # [phone] Cell # [phone]  Office # [phone] [email or web address]

## 2018-11-11 ENCOUNTER — Telehealth: Payer: Self-pay | Admitting: Family Medicine

## 2018-11-11 NOTE — Telephone Encounter (Signed)
The medication Methocarbam Tab 500 mg was denied by the insurance company but they provided her with a temporary supply. Denial letter placed in the providers box at nurses station.

## 2018-11-12 DIAGNOSIS — I2510 Atherosclerotic heart disease of native coronary artery without angina pectoris: Secondary | ICD-10-CM | POA: Diagnosis not present

## 2018-11-12 DIAGNOSIS — F0390 Unspecified dementia without behavioral disturbance: Secondary | ICD-10-CM | POA: Diagnosis not present

## 2018-11-12 DIAGNOSIS — M5136 Other intervertebral disc degeneration, lumbar region: Secondary | ICD-10-CM | POA: Diagnosis not present

## 2018-11-12 DIAGNOSIS — K219 Gastro-esophageal reflux disease without esophagitis: Secondary | ICD-10-CM | POA: Diagnosis not present

## 2018-11-18 ENCOUNTER — Telehealth: Payer: Self-pay | Admitting: Family Medicine

## 2018-11-18 NOTE — Telephone Encounter (Signed)
Copied from [place]. Topic: General - Inquiry >> Nov 18, 2018  3:34 PM Virl Axe D wrote: Reason for CRM: Pt's niece stated that Pt will be returning home from a nursing facility on 11/24/18. In order for Medicaid to start covering for an aide to come to pt's home they need a form filled out by Dr. Nolon Rod signing off on this and returned to [facility]. Pt's niece did not know the name of the form. Please reach out to niece with any questions. Jacqulyn Cane [phone]

## 2018-11-19 ENCOUNTER — Ambulatory Visit: Payer: Medicare Other | Admitting: Podiatry

## 2018-11-19 NOTE — Telephone Encounter (Signed)
Spoke with niece [name] she advises pt will be leaving nursing facility on 12/1 as pt health declined more while there and thinks she only went there due to grieving her son.  She advises [employer] advises primary needs to complete form so pt can get an aide to help around home (light housekeeping, meals) and her primary will know what form to send. ? FL2, will call [employer] at [phone].  Advised niece will let her know my findings. Dgaddy, CMA

## 2018-11-23 NOTE — Telephone Encounter (Signed)
Form received and will give to [facility] for completion on 11/26/18. Dgaddy, CMA

## 2018-11-26 NOTE — Telephone Encounter (Signed)
Malachy Mood ( niece ) called back and said to please call her when the form is completed [phone]

## 2018-11-26 NOTE — Telephone Encounter (Signed)
pls see note 

## 2018-12-01 ENCOUNTER — Other Ambulatory Visit: Payer: Self-pay | Admitting: Family Medicine

## 2018-12-02 ENCOUNTER — Other Ambulatory Visit: Payer: Self-pay | Admitting: Family Medicine

## 2018-12-02 NOTE — Telephone Encounter (Signed)
Copied from [place] [phone]. Topic: Quick Communication - Rx Refill/Question >> Dec 02, 2018  8:59 AM Lionel December wrote: CRM for notification. See Telephone encounter for: 12/02/18. Medication: hydrochlorothiazide (MICROZIDE) 12.5 MG capsule  Has the patient contacted their pharmacy? Yes.   (Agent: If no, request that the patient contact the pharmacy for the refill.) (Agent: If yes, when and what did the pharmacy advise?)  Preferred Pharmacy (with phone number or street name): [facility] [phone] - [place], [place] AT [place] [phone] (Phone) [phone] (Fax  Pharmacy told pt to have provider call in medication  Agent: Please be advised that RX refills may take up to 3 business days. We ask that you follow-up with your pharmacy.

## 2018-12-02 NOTE — Telephone Encounter (Signed)
Copied from [place] [id number]. Topic: Quick Communication - Rx Refill/Question >> Dec 02, 2018  9:49 AM Judyann Munson wrote: Medication: verapamil (CALAN-SR) 240 MG CR tablet,metoprolol succinate (TOPROL-XL) 50 MG 24 hr tablet  Has the patient contacted their pharmacy yes   Preferred Pharmacy (with phone number or street name): [facility] [phone] - [place], [place] - [place] AT [place] [phone] (Phone) [phone] (Fax)    Agent: Please be advised that RX refills may take up to 3 business days. We ask that you follow-up with your pharmacy.

## 2018-12-03 ENCOUNTER — Other Ambulatory Visit: Payer: Self-pay | Admitting: *Deleted

## 2018-12-03 ENCOUNTER — Telehealth: Payer: Self-pay | Admitting: *Deleted

## 2018-12-03 MED ORDER — HYDROCHLOROTHIAZIDE 12.5 MG PO CAPS: ORAL_CAPSULE | ORAL | 0 refills | Status: DC

## 2018-12-03 MED ORDER — METOPROLOL SUCCINATE ER 50 MG PO TB24: ORAL_TABLET | ORAL | 0 refills | Status: DC

## 2018-12-03 NOTE — Telephone Encounter (Signed)
Copied from [place] [phone]. Topic: Quick Communication - Rx Refill/Question >> Dec 03, 2018 12:18 PM Windy Kalata wrote: Medication: metoprolol succinate (TOPROL-XL) 50 MG 24 hr tablet,     Has the patient contacted their pharmacy? Yes.   (Agent: If no, request that the patient contact the pharmacy for the refill.) (Agent: If yes, when and what did the pharmacy advise?) No refills  Preferred Pharmacy (with phone number or street name):   Agent: Please be advised that RX refills may take up to 3 business days. We ask that you follow-up with your pharmacy.

## 2018-12-03 NOTE — Telephone Encounter (Signed)
Patient called the [facility] stating she has not eaten in two weeks, and there was no food in her house,  stated her niece has not come to check on her for that long.  The [facility] was advised to do a welfare check on patient and call niece due to confusion from patient.   Patient was saying she needed help with food shopping and stated paperwork was sent in.   Please advise

## 2018-12-03 NOTE — Telephone Encounter (Signed)
Requested medication (s) are due for refill today: yes  Requested medication (s) are on the active medication list: yes  Last refill:  10/16/18  Future visit scheduled: yes 03/05/19  Notes to clinic: no mammogram   Requested Prescriptions  Pending Prescriptions Disp Refills   estradiol (ESTRACE) 0.1 MG/GM vaginal cream [Pharmacy Med Name: ESTRADIOL 0.01% VAG CREAM 42.5GM] 42.5 g 0    Sig: INSERT 1 APPLICATORFUL VAGINALLY 1 TIME A WEEK     OB/GYN:  Estrogens Failed - 12/01/2018  1:55 PM      Failed - Mammogram is up-to-date per Health Maintenance      Passed - Last BP in normal range    BP Readings from Last 1 Encounters:  09/23/18 138/68         Passed - Valid encounter within last 12 months    Recent Outpatient Visits          1 month ago Screening-pulmonary TB   Primary Care at [facility], [place] D, PA-C   1 month ago PPD screening test   Primary Care at [facility], Arlie Solomons, MD   2 months ago Urinary symptom or sign   Primary Care at [facility], Arlie Solomons, MD   3 months ago Burning with urination   Primary Care at [place], MD   7 months ago Acute non intractable tension-type headache   Primary Care at [facility], Renette Butters, MD      Future Appointments            In 3 months Forrest Moron, MD Primary Care at [place], [facility]

## 2018-12-03 NOTE — Telephone Encounter (Signed)
Requested medication (s) are due for refill today: yes  Requested medication (s) are on the active medication list: yes  Last refill:  04/15/18 #90 with 1 refill  Future visit scheduled: yes, 03/05/19  Notes to clinic:  Unable to refill per protocol. Medication last filled by Dr. Tamala Julian.     Requested Prescriptions  Pending Prescriptions Disp Refills   verapamil (CALAN-SR) 240 MG CR tablet 90 tablet 1    Sig: Take 1 tablet (240 mg total) by mouth daily.     Off-Protocol Failed - 12/03/2018 12:24 PM      Failed - Medication not assigned to a protocol, review manually.      Passed - Valid encounter within last 12 months    Recent Outpatient Visits          1 month ago Screening-pulmonary TB   Primary Care at [facility], [place] D, PA-C   1 month ago PPD screening test   Primary Care at [facility], Arlie Solomons, MD   2 months ago Urinary symptom or sign   Primary Care at [facility], Arlie Solomons, MD   3 months ago Burning with urination   Primary Care at [facility], [place] A, MD   7 months ago Acute non intractable tension-type headache   Primary Care at [facility], Renette Butters, MD      Future Appointments            In 3 months Forrest Moron, MD Primary Care at [place], [facility]

## 2018-12-03 NOTE — Telephone Encounter (Signed)
pls see note. thanks 

## 2018-12-03 NOTE — Telephone Encounter (Signed)
Pt checking status on the form being completed for her to have an aide come out to her home to help her. Please advise pt.

## 2018-12-03 NOTE — Telephone Encounter (Signed)
Please advise 

## 2018-12-03 NOTE — Telephone Encounter (Signed)
Duplicate request.  See previous encounter.

## 2018-12-05 ENCOUNTER — Ambulatory Visit: Payer: Self-pay

## 2018-12-05 MED ORDER — VERAPAMIL HCL ER 240 MG PO TBCR: 240.0000 mg | EXTENDED_RELEASE_TABLET | Freq: Every day | ORAL | 1 refills | Status: DC

## 2018-12-05 NOTE — Telephone Encounter (Signed)
Patient is calling to check the status of this, patient said she really needs this medication by the end of the day because she is out of the medication.

## 2018-12-05 NOTE — Telephone Encounter (Signed)
Pt calling to check status of verapamil (CALAN-SR) 240 MG CR tablet  [facility] [phone] - [place], [place] AT [place] [phone] (Phone) [phone] (Fax)

## 2018-12-05 NOTE — Telephone Encounter (Signed)
Patient is calling to check the status of this. Please contact patient

## 2018-12-05 NOTE — Telephone Encounter (Signed)
Pt called to ask if she can take 2 iron pills at the same time. Pt stated that she is anemic and cold. Pt stated she has heat in her house but is still cold.  Pt advised not to take 2 iron pills take only 1. Pt also requesting a refill for her verapamil. Refill request already sent to [facility] (see refill note 12/02/18)  Reason for Disposition . General information question, no triage required and triager able to answer question  Protocols used: INFORMATION ONLY CALL-A-AH

## 2018-12-06 ENCOUNTER — Telehealth: Payer: Self-pay | Admitting: Family Medicine

## 2018-12-06 NOTE — Telephone Encounter (Signed)
Completed and faxed back on yesterday. Dgaddy, CMA

## 2018-12-06 NOTE — Telephone Encounter (Signed)
Necessary paperwork sent via fax on 12/05/18. Dgaddy, CMA

## 2018-12-06 NOTE — Telephone Encounter (Signed)
Completed and sent back . Dgaddy, CMA

## 2018-12-06 NOTE — Telephone Encounter (Signed)
Copied from [place]. Topic: General - Inquiry >> Nov 18, 2018  3:34 PM Virl Axe D wrote: Reason for CRM: Pt's niece stated that Pt will be returning home from a nursing facility on 11/24/18. In order for Medicaid to start covering for an aide to come to pt's home they need a form filled out by Dr. Nolon Rod signing off on this and returned to [facility]. Pt's niece did not know the name of the form. Please reach out to niece with any questions. Jacqulyn Cane [phone] >> Dec 02, 2018 10:39 AM Windy Kalata wrote: Jacqulyn Cane called this morning asking if this form was faxed to [facility], she would like a call letting her know if so, please advise.  Best call back is [phone] >> Dec 05, 2018  4:52 PM Sharene Skeans wrote: Pt is calling about the forms for her to have an aid come to her home. Paperwork was sent to Dr. Nolon Rod from [facility]. Pt will also like mobile meals

## 2018-12-12 ENCOUNTER — Ambulatory Visit (INDEPENDENT_AMBULATORY_CARE_PROVIDER_SITE_OTHER): Payer: Medicare Other | Admitting: Family Medicine

## 2018-12-12 ENCOUNTER — Other Ambulatory Visit: Payer: Self-pay

## 2018-12-12 ENCOUNTER — Encounter: Payer: Self-pay | Admitting: Family Medicine

## 2018-12-12 VITALS — BP 132/60 | HR 83 | Temp 98.0°F | Resp 17 | Ht 59.0 in | Wt 109.4 lb

## 2018-12-12 DIAGNOSIS — F4321 Adjustment disorder with depressed mood: Secondary | ICD-10-CM | POA: Diagnosis not present

## 2018-12-12 DIAGNOSIS — D539 Nutritional anemia, unspecified: Secondary | ICD-10-CM

## 2018-12-12 DIAGNOSIS — R634 Abnormal weight loss: Secondary | ICD-10-CM

## 2018-12-12 DIAGNOSIS — R42 Dizziness and giddiness: Secondary | ICD-10-CM

## 2018-12-12 DIAGNOSIS — I2510 Atherosclerotic heart disease of native coronary artery without angina pectoris: Secondary | ICD-10-CM

## 2018-12-12 DIAGNOSIS — I10 Essential (primary) hypertension: Secondary | ICD-10-CM | POA: Diagnosis not present

## 2018-12-12 LAB — POCT URINALYSIS DIP (MANUAL ENTRY)
Bilirubin, UA: NEGATIVE
Blood, UA: NEGATIVE
Glucose, UA: NEGATIVE mg/dL
Ketones, POC UA: NEGATIVE mg/dL
Leukocytes, UA: NEGATIVE
Nitrite, UA: NEGATIVE
Protein Ur, POC: NEGATIVE mg/dL
SPEC GRAV UA: 1.02 (ref 1.010–1.025)
Urobilinogen, UA: 0.2 E.U./dL
pH, UA: 6.5 (ref 5.0–8.0)

## 2018-12-12 LAB — POCT CBC
Granulocyte percent: 77.7 %G (ref 37–80)
HCT, POC: 28.7 % — AB (ref 29–41)
Hemoglobin: 9.6 g/dL — AB (ref 11–14.6)
LYMPH, POC: 0.9 (ref 0.6–3.4)
MCH, POC: 25.6 pg — AB (ref 27–31.2)
MCHC: 33.4 g/dL (ref 31.8–35.4)
MCV: 76.6 fL (ref 76–111)
MID (CBC): 0.3 (ref 0–0.9)
MPV: 6.9 fL (ref 0–99.8)
POC Granulocyte: 4.2 (ref 2–6.9)
POC LYMPH PERCENT: 16.7 %L (ref 10–50)
POC MID %: 5.6 %M (ref 0–12)
Platelet Count, POC: 425 10*3/uL — AB (ref 142–424)
RBC: 3.74 M/uL — AB (ref 4.04–5.48)
RDW, POC: 19.5 %
WBC: 5.4 10*3/uL (ref 4.6–10.2)

## 2018-12-12 LAB — GLUCOSE, POCT (MANUAL RESULT ENTRY): POC Glucose: 92 mg/dl (ref 70–99)

## 2018-12-12 MED ORDER — METOPROLOL SUCCINATE ER 50 MG PO TB24: ORAL_TABLET | ORAL | 3 refills | Status: DC

## 2018-12-12 MED ORDER — HYDROCHLOROTHIAZIDE 12.5 MG PO CAPS: ORAL_CAPSULE | ORAL | 3 refills | Status: DC

## 2018-12-12 MED ORDER — VERAPAMIL HCL ER 240 MG PO TBCR: 240.0000 mg | EXTENDED_RELEASE_TABLET | Freq: Every day | ORAL | 3 refills | Status: DC

## 2018-12-12 NOTE — Progress Notes (Signed)
Established Patient Office Visit  Subjective:  Patient ID: [name], female    DOB: 01-Mar-1933  Age: 84 y.o. MRN: [id number]  CC:  Chief Complaint  Patient presents with  . Hypertension    pt c/o dizziness     HPI [name] presents for   Concern for dizziness She states that she was having symptoms of dizziness which has been ongoing for a week. She reports that she had to work on her son's estate and the stress really affected her. She reports that she also does not feel like she is care for. She reports that she eats and drinks and takes her medications She denies chest pain She has not fallen She reports that she has to hold on to the table even if she is sitting and it happens when she has to talk about her son. She states that she did not know about his finances and now her only child has died and all this has fallen on her lap.  She denies suicidal thoughts Depression screen [facility] 2/9 12/12/2018 09/17/2018 09/13/2018 09/04/2018 04/15/2018  Decreased Interest 0 2 2 0 0  Down, Depressed, Hopeless 0 3 3 0 0  PHQ - 2 Score 0 5 5 0 0  Altered sleeping - 3 3 - -  Tired, decreased energy - 2 2 - -  Change in appetite - 1 0 - -  Feeling bad or failure about yourself  - 0 0 - -  Trouble concentrating - 0 0 - -  Moving slowly or fidgety/restless - 0 0 - -  Suicidal thoughts - 0 0 - -  PHQ-9 Score - 11 10 - -  Difficult doing work/chores - Very difficult Very difficult - -  Some recent data might be hidden     Hypertension: Patient here for follow-up of elevated blood pressure. She is not exercising and is not adherent to low salt diet.  Blood pressure is well controlled at home. Cardiac symptoms fatigue. Patient denies chest pain, chest pressure/discomfort, dyspnea, exertional chest pressure/discomfort, irregular heart beat, lower extremity edema, orthopnea, palpitations and paroxysmal nocturnal dyspnea.  Cardiovascular risk factors: advanced age (older than 65 for men, 45 for  women), hypertension and sedentary lifestyle. Use of agents associated with hypertension: none. History of target organ damage: none. BP Readings from Last 3 Encounters:  12/12/18 132/60  09/23/18 138/68  09/04/18 116/71     Past Medical History:  Diagnosis Date  . Abnormal finding on Pap smear, ASCUS   . Acute renal insufficiency   . Amputation finger    index  . Barrett's esophagus   . CAD (coronary artery disease)   . Dementia ([place])   . Diverticulosis   . Elevated BP   . Foot deformity   . GERD (gastroesophageal reflux disease)   . Glaucoma   . H/O: GI bleed   . HSV-2 (herpes simplex virus 2) infection   . Hyperglycemia   . Hypertension   . Migraine   . Osteoporosis    worsening  . Spondylosis     Past Surgical History:  Procedure Laterality Date  . AMPUTATION FINGER / THUMB     secondary to osteomyelitis  . CATARACT EXTRACTION, BILATERAL Bilateral 12/25/2016  . COLONOSCOPY    . ESOPHAGOGASTRODUODENOSCOPY N/A 10/22/2016   Procedure: ESOPHAGOGASTRODUODENOSCOPY (EGD);  Surgeon: Doran Stabler, MD;  Location: [facility] ENDOSCOPY;  Service: Endoscopy;  Laterality: N/A;  . FOOT SURGERY     right  . PARTIAL HYSTERECTOMY    .  UPPER GASTROINTESTINAL ENDOSCOPY      Family History  Problem Relation Age of Onset  . Breast cancer Mother   . Heart attack Mother   . Heart disease Father   . Alcohol abuse Son   . Colon cancer Neg Hx   . Stomach cancer Neg Hx   . Rectal cancer Neg Hx     Social History   Socioeconomic History  . Marital status: Single    Spouse name: Not on file  . Number of children: 1  . Years of education: Not on file  . Highest education level: 12th grade  Occupational History    Employer: RETIRED  Social Needs  . Financial resource strain: Not on file  . Food insecurity:    Worry: Not on file    Inability: Not on file  . Transportation needs:    Medical: Not on file    Non-medical: Not on file  Tobacco Use  . Smoking status: Never  Smoker  . Smokeless tobacco: Never Used  Substance and Sexual Activity  . Alcohol use: No    Alcohol/week: 0.0 standard drinks  . Drug use: No  . Sexual activity: Never    Birth control/protection: Post-menopausal  Lifestyle  . Physical activity:    Days per week: Not on file    Minutes per session: Not on file  . Stress: Not on file  Relationships  . Social connections:    Talks on phone: Not on file    Gets together: Not on file    Attends religious service: Not on file    Active member of club or organization: Not on file    Attends meetings of clubs or organizations: Not on file    Relationship status: Not on file  . Intimate partner violence:    Fear of current or ex partner: Not on file    Emotionally abused: Not on file    Physically abused: Not on file    Forced sexual activity: Not on file  Other Topics Concern  . Not on file  Social History Narrative   Marital status: divorced.      Children: 1 son; no grandchildren      Lives: with son      Employment: retired      Tobacco: none      Alcohol: none      Drugs: none       Exercise: none       ADLs:  Independent with ADLs.  Quit driving age 83.  Medical CNA cleans house; makes breakfast; patient pays bills.      Advanced Directives:   YES.  HCPOA:  Son/Isaac Bowman.  FULL CODE; no prolonged measures.    Outpatient Medications Prior to Visit  Medication Sig Dispense Refill  . aspirin 81 MG tablet Take 81 mg by mouth every morning.     . Calcium Carbonate-Vitamin D (CALCIUM-VITAMIN D) 500-200 MG-UNIT tablet Take 3 tablets by mouth every morning.    . cyclobenzaprine (FLEXERIL) 5 MG tablet TAKE 1/2 TO 1 TABLET BY MOUTH UP TO TWICE DAILY AS NEEDED FOR MUSCLE SPASMS 30 tablet 0  . Elastic Bandages & Supports ([employer] LUMBAR/BACK SUPPORT BRACE) MISC 1 application by Does not apply route daily. Wear brace for back support 1 each 1  . estradiol (ESTRACE) 0.1 MG/GM vaginal cream INSERT 1 APPLICATORFUL VAGINALLY 1 TIME A WEEK  42.5 g 0  . FLUAD 0.5 ML SUSY ADM 0.5ML IM UTD  0  . gabapentin (NEURONTIN)  800 MG tablet Take 1 tablet (800 mg total) by mouth at bedtime. 90 tablet 1  . hydrocortisone 2.5 % ointment APPLY EXTERNALLY TO THE AFFECTED AREA TWICE DAILY 28.35 g 0  . hydroxypropyl methylcellulose (ISOPTO TEARS) 2.5 % ophthalmic solution Place 1 drop into both eyes 2 (two) times daily as needed for dry eyes.     . Influenza Vac A&B Surf Ant Adj (FLUAD) 0.5 ML SUSY Fluad 2019-20 28yrup(PF)45 mcg(15 mcgx3)/0.5 mL intramuscular syringe  ADM 0.5ML IM UTD    . lactose free nutrition (BOOST) LIQD Take 237 mLs by mouth 2 (two) times daily between meals. (Patient taking differently: Take 1 Container by mouth 2 (two) times daily as needed (nutritional). ) 60 Can 11  . lidocaine (LIDODERM) 5 % Place 1 patch onto the skin daily. Remove & Discard patch within 12 hours or as directed by MD.  Apply to the right side of your neck where it hurts 30 patch 0  . nystatin cream (MYCOSTATIN) APP EXT AA BID FOR 10 DAYS PRN  1  . pantoprazole (PROTONIX) 40 MG tablet TAKE 1 TABLET BY MOUTH EVERY DAY 30 tablet 5  . promethazine (PHENERGAN) 12.5 MG tablet take 1 tablet by mouth every 6 hours if needed for nausea and vomiting 30 tablet 0  . traMADol-acetaminophen (ULTRACET) 37.5-325 MG tablet Take 1 tablet by mouth every 6 (six) hours.  0  . triamcinolone cream (KENALOG) 0.1 % Apply topically 2 (two) times daily. 30 g 1  . vitamin C (ASCORBIC ACID) 500 MG tablet Take 500 mg by mouth daily.    . ciprofloxacin (CIPRO) 250 MG tablet Take 1 tablet (250 mg total) by mouth 2 (two) times daily. 6 tablet 0  . hydrochlorothiazide (MICROZIDE) 12.5 MG capsule TAKE 1 CAPSULE BY MOUTH EVERY TUESDAY AND SATURDAY 90 capsule 0  . methocarbamol (ROBAXIN) 500 MG tablet Take 1 tablet (500 mg total) by mouth 2 (two) times daily. 20 tablet 0  . metoprolol succinate (TOPROL-XL) 50 MG 24 hr tablet take 1 tablet by mouth once daily WITH OR IMMEDIATELY FOLLOWING A MEAL  90 tablet 0  . verapamil (CALAN-SR) 240 MG CR tablet Take 1 tablet (240 mg total) by mouth daily. 90 tablet 1   No facility-administered medications prior to visit.     No Known Allergies  ROS Review of Systems Review of Systems  Constitutional: Negative for activity change, appetite change, chills and fever.  HENT: Negative for congestion, nosebleeds, trouble swallowing and voice change.   Respiratory: Negative for cough, shortness of breath and wheezing.   Gastrointestinal: Negative for diarrhea, nausea and vomiting.  Genitourinary: Negative for difficulty urinating, dysuria, flank pain and hematuria.  Musculoskeletal: Negative for back pain, joint swelling and neck pain.  Neurological: +dizziness, no speech difficulty, light-headedness and numbness.  See HPI. All other review of systems negative.     Objective:    Physical Exam  BP 132/60 (BP Location: Left Arm, Patient Position: Sitting, Cuff Size: Normal)   Pulse 83   Temp 98 F (36.7 C) (Oral)   Resp 17   Ht 4' 11"  (1.499 m)   Wt 109 lb 6.4 oz (49.6 kg)   SpO2 97%   BMI 22.10 kg/m  Wt Readings from Last 3 Encounters:  12/12/18 109 lb 6.4 oz (49.6 kg)  09/23/18 118 lb (53.5 kg)  09/04/18 112 lb 6.4 oz (51 kg)   Physical Exam  Constitutional: Oriented to person, place, and time. Appears well-developed and well-nourished. Walks bent forward  due to kyphosis HENT:  Head: Normocephalic and atraumatic.  Eyes: Conjunctivae and EOM are normal.  Cardiovascular: Normal rate, regular rhythm, normal heart sounds and intact distal pulses.  No murmur heard. Pulmonary/Chest: Effort normal and breath sounds normal. No stridor. No respiratory distress. Has no wheezes.  Neurological: Is alert and oriented to person, place, and time.  Skin: Skin is warm. Capillary refill takes less than 2 seconds.  Psychiatric: Has a normal mood and affect. Behavior is normal. Judgment and thought content normal.   ECG: Normal sinus rhythm, no  arrhythmia or av block  Health Maintenance Due  Topic Date Due  . INFLUENZA VACCINE  07/25/2018    There are no preventive care reminders to display for this patient.  Lab Results  Component Value Date   TSH 1.470 04/15/2018   Lab Results  Component Value Date   WBC 5.4 12/12/2018   HGB 9.6 (A) 12/12/2018   HCT 28.7 (A) 12/12/2018   MCV 76.6 12/12/2018   PLT 511 (H) 04/15/2018   Lab Results  Component Value Date   NA 144 12/12/2018   K 4.4 12/12/2018   CO2 27 12/12/2018   GLUCOSE 77 12/12/2018   BUN 18 12/12/2018   CREATININE 0.95 12/12/2018   BILITOT <0.2 12/12/2018   ALKPHOS 56 12/12/2018   AST 13 12/12/2018   ALT 9 12/12/2018   PROT 6.6 12/12/2018   ALBUMIN 3.8 12/12/2018   CALCIUM 9.7 12/12/2018   ANIONGAP 7 10/22/2016   Lab Results  Component Value Date   CHOL 176 05/30/2017   Lab Results  Component Value Date   HDL 82 05/30/2017   Lab Results  Component Value Date   LDLCALC 80 05/30/2017   Lab Results  Component Value Date   TRIG 70 05/30/2017   Lab Results  Component Value Date   CHOLHDL 2.1 05/30/2017   Lab Results  Component Value Date   HGBA1C 5.4 05/30/2017      Assessment & Plan:   Problem List Items Addressed This Visit      Cardiovascular and Mediastinum   Essential hypertension-  bp stable, refilled current meds but discussed that she should skip the bp med if she does not eat and let us know She is agreeable to this  Does not seem to be related to bp as the dizziness is provoked during stressful situations    Relevant Medications   verapamil (CALAN-SR) 240 MG CR tablet   metoprolol succinate (TOPROL-XL) 50 MG 24 hr tablet   hydrochlorothiazide (MICROZIDE) 12.5 MG capsule    Other Visit Diagnoses    Dizziness    -  Primary -   Normal electrolytes, worsening anemia Could be nutritional or related to other source such as GI Will have patient return to recheck vitals and check on her dizziness and iron panel as well.     Relevant Orders   POCT urinalysis dipstick (Completed)   EKG 12-Lead (Completed)   CMP14+EGFR (Completed)   POCT CBC (Completed)   POCT glucose (manual entry) (Completed)   Grief reaction     Continued to offer support and ask what other resources are needed        Meds ordered this encounter  Medications  . verapamil (CALAN-SR) 240 MG CR tablet    Sig: Take 1 tablet (240 mg total) by mouth daily.    Dispense:  90 tablet    Refill:  3  . metoprolol succinate (TOPROL-XL) 50 MG 24 hr tablet    Sig: take  1 tablet by mouth once daily WITH OR IMMEDIATELY FOLLOWING A MEAL    Dispense:  90 tablet    Refill:  3  . hydrochlorothiazide (MICROZIDE) 12.5 MG capsule    Sig: TAKE 1 CAPSULE BY MOUTH EVERY TUESDAY AND SATURDAY    Dispense:  90 capsule    Refill:  3    Follow-up: Return in about 3 months (around 03/13/2019) for anemia.    Forrest Moron, MD

## 2018-12-12 NOTE — Patient Instructions (Signed)
° ° ° °  If you have lab work done today you will be contacted with your lab results within the next 2 weeks.  If you have not heard from us then please contact us. The fastest way to get your results is to register for My Chart. ° ° °IF you received an x-ray today, you will receive an invoice from [facility]. Please contact [facility] at [phone] with questions or concerns regarding your invoice.  ° °IF you received labwork today, you will receive an invoice from [facility]. Please contact [facility] at [phone] with questions or concerns regarding your invoice.  ° °Our billing staff will not be able to assist you with questions regarding bills from these companies. ° °You will be contacted with the lab results as soon as they are available. The fastest way to get your results is to activate your My Chart account. Instructions are located on the last page of this paperwork. If you have not heard from us regarding the results in 2 weeks, please contact this office. °  ° ° ° °

## 2018-12-13 LAB — CMP14+EGFR
ALT: 9 IU/L (ref 0–32)
AST: 13 IU/L (ref 0–40)
Albumin/Globulin Ratio: 1.4 (ref 1.2–2.2)
Albumin: 3.8 g/dL (ref 3.5–4.7)
Alkaline Phosphatase: 56 IU/L (ref 39–117)
BUN/Creatinine Ratio: 19 (ref 12–28)
BUN: 18 mg/dL (ref 8–27)
Bilirubin Total: <0.2 mg/dL (ref 0.0–1.2)
CO2: 27 mmol/L (ref 20–29)
Calcium: 9.7 mg/dL (ref 8.7–10.3)
Chloride: 102 mmol/L (ref 96–106)
Creatinine, Ser: 0.95 mg/dL (ref 0.57–1.00)
GFR calc Af Amer: 64 mL/min/{1.73_m2} (ref >59)
GFR calc non Af Amer: 56 mL/min/{1.73_m2} — ABNORMAL LOW (ref >59)
Globulin, Total: 2.8 g/dL (ref 1.5–4.5)
Glucose: 77 mg/dL (ref 65–99)
POTASSIUM: 4.4 mmol/L (ref 3.5–5.2)
Sodium: 144 mmol/L (ref 134–144)
Total Protein: 6.6 g/dL (ref 6.0–8.5)

## 2018-12-16 ENCOUNTER — Telehealth: Payer: Self-pay | Admitting: Family Medicine

## 2018-12-16 ENCOUNTER — Encounter: Payer: Self-pay | Admitting: Family Medicine

## 2018-12-16 DIAGNOSIS — Z1231 Encounter for screening mammogram for malignant neoplasm of breast: Secondary | ICD-10-CM | POA: Diagnosis not present

## 2018-12-16 DIAGNOSIS — Z803 Family history of malignant neoplasm of breast: Secondary | ICD-10-CM | POA: Diagnosis not present

## 2018-12-16 NOTE — Telephone Encounter (Signed)
Tried to call [name] to schedule her an appt with Dr. Nolon Rod - no VM picked up to leave a message.   If [name] calls back, please call [place] and ask for Lavella Lemons - I will need to make a same day - OV - to check on her dizziness/blood pressure and weight.

## 2018-12-17 NOTE — Telephone Encounter (Signed)
Was able to talk to pt this morning and schedule appt for 01/01/19 with Dr. Nolon Rod. Pt stated that her dizziness was not happening anymore but that she would come to the appt. I advised of time, building and late policy. Pt acknowledged.

## 2019-01-01 ENCOUNTER — Other Ambulatory Visit: Payer: Self-pay

## 2019-01-01 ENCOUNTER — Ambulatory Visit (INDEPENDENT_AMBULATORY_CARE_PROVIDER_SITE_OTHER): Payer: Medicare Other | Admitting: Family Medicine

## 2019-01-01 ENCOUNTER — Encounter: Payer: Self-pay | Admitting: Family Medicine

## 2019-01-01 VITALS — BP 157/67 | HR 96 | Temp 98.9°F | Resp 17 | Ht 59.0 in | Wt 107.4 lb

## 2019-01-01 DIAGNOSIS — R42 Dizziness and giddiness: Secondary | ICD-10-CM

## 2019-01-01 DIAGNOSIS — D539 Nutritional anemia, unspecified: Secondary | ICD-10-CM

## 2019-01-01 DIAGNOSIS — R634 Abnormal weight loss: Secondary | ICD-10-CM | POA: Diagnosis not present

## 2019-01-01 DIAGNOSIS — M5136 Other intervertebral disc degeneration, lumbar region: Secondary | ICD-10-CM | POA: Diagnosis not present

## 2019-01-01 DIAGNOSIS — R630 Anorexia: Secondary | ICD-10-CM | POA: Diagnosis not present

## 2019-01-01 DIAGNOSIS — I10 Essential (primary) hypertension: Secondary | ICD-10-CM

## 2019-01-01 MED ORDER — FERROUS SULFATE DRIED ER 160 (50 FE) MG PO TBCR: 160.0000 mg | EXTENDED_RELEASE_TABLET | Freq: Every day | ORAL | 6 refills | Status: DC

## 2019-01-01 NOTE — Patient Instructions (Addendum)
Current Outpatient Medications  Medication Sig Dispense Refill  . aspirin 81 MG tablet Take 81 mg by mouth every morning.     . Calcium Carbonate-Vitamin D (CALCIUM-VITAMIN D) 500-200 MG-UNIT tablet Take 3 tablets by mouth every morning.    .      . estradiol (ESTRACE) 0.1 MG/GM vaginal cream INSERT 1 APPLICATORFUL VAGINALLY 1 TIME A WEEK 42.5 g 0  . gabapentin (NEURONTIN) 800 MG tablet Take 1 tablet (800 mg total) by mouth at bedtime. 90 tablet 1  . hydrocortisone 2.5 % ointment APPLY EXTERNALLY TO THE AFFECTED AREA TWICE DAILY 28.35 g 0  . hydroxypropyl methylcellulose (ISOPTO TEARS) 2.5 % ophthalmic solution Place 1 drop into both eyes 2 (two) times daily as needed for dry eyes.     Marland Kitchen lactose free nutrition (BOOST) LIQD Take 237 mLs by mouth 2 (two) times daily between meals. (Patient taking differently: Take 1 Container by mouth 2 (two) times daily as needed (nutritional). ) 60 Can 11  . lidocaine (LIDODERM) 5 % Place 1 patch onto the skin daily. Remove & Discard patch within 12 hours or as directed by MD.  Apply to the right side of your neck where it hurts 30 patch 0  . nystatin cream (MYCOSTATIN) APP EXT AA BID FOR 10 DAYS PRN  1  . For acid reflux pantoprazole (PROTONIX) 40 MG tablet  For Nausea: TAKE 1 TABLET BY MOUTH EVERY DAY 30 tablet 5  . promethazine (PHENERGAN) 12.5 MG tablet take 1 tablet by mouth every 6 hours if needed for nausea and vomiting 30 tablet 0  . pain medication traMADol-acetaminophen (ULTRACET) 37.5-325 MG tablet Take 1 tablet by mouth every 6 (six) hours.  0  . triamcinolone cream (KENALOG) 0.1 %   Apply topically 2 (two) times daily. 30 g 1        . For blood pressure verapamil (CALAN-SR) 240 MG CR tablet  Hydrochlorothiazide      Metoprolol Succinate (Toprol-XL) 50 mg 24 hr tablet Take 1 tablet (240 mg total) by mouth daily.  Take 1 capsule by mouth Every Tuesday and Saturday  Take one tablet by mouth daily with or immediately after eating 90  tablet 3   For low iron or anemia     . vitamin C (ASCORBIC ACID) 500 MG tablet  Take 500 mg by mouth daily.    . ferrous sulfate (SM IRON SLOW RELEASE) 160 (50 Fe) MG TBCR SR tablet Take 1 tablet (160 mg total) by mouth daily. 30 each 6   No current facility-administered medications for this visit.       If you have lab work done today you will be contacted with your lab results within the next 2 weeks.  If you have not heard from [place] then please contact us. The fastest way to get your results is to register for My Chart.   IF you received an x-ray today, you will receive an invoice from [facility]. Please contact [facility] at [phone] with questions or concerns regarding your invoice.   IF you received labwork today, you will receive an invoice from [facility]. Please contact [facility] at [phone] with questions or concerns regarding your invoice.   Our billing staff will not be able to assist you with questions regarding bills from these companies.  You will be contacted with the lab results as soon as they are available. The fastest way to get your results is to activate your My Chart account. Instructions are located on the  last page of this paperwork. If you have not heard from [place] regarding the results in 2 weeks, please contact this office.

## 2019-01-01 NOTE — Progress Notes (Signed)
Established Patient Office Visit  Subjective:  Patient ID: [name], female    DOB: 12-Apr-1933  Age: 85 y.o. MRN: [id number]  CC:  Chief Complaint  Patient presents with  . check dizziness per Valton Schwartz    per pt she having a little dizziness depends on how she holds her head and  stomach bothering her and she is not able to eat, pt c/o gas and burping alot    HPI [name] presents for   Follow up on her dizziness  Reflux She reports that acidic foods, like chocolate and tomatoes are affect with heart burn She reports that she makes herself throw up to ease the heart burn She states that she has passes hard stools that are like little balls  Dizziness She states that her dizziness is better and she does not feel like she is feel like she is going to fall or anything She denies any other symptoms from the dizziness and things it is psychological due to the death of her son.  Anemia She reports that she feels so tired and has poor appetite She had Endoscopy in 2017 which showed barrett's esophagus and hiatal hernia She states that she wasn't taking her iron for a few months because they were not giving it to her in the assisted living but now she is back home.    Past Medical History:  Diagnosis Date  . Abnormal finding on Pap smear, ASCUS   . Acute renal insufficiency   . Amputation finger    index  . Barrett's esophagus   . CAD (coronary artery disease)   . Dementia ([place])   . Diverticulosis   . Elevated BP   . Foot deformity   . GERD (gastroesophageal reflux disease)   . Glaucoma   . H/O: GI bleed   . HSV-2 (herpes simplex virus 2) infection   . Hyperglycemia   . Hypertension   . Migraine   . Osteoporosis    worsening  . Spondylosis     Past Surgical History:  Procedure Laterality Date  . AMPUTATION FINGER / THUMB     secondary to osteomyelitis  . CATARACT EXTRACTION, BILATERAL Bilateral 12/25/2016  . COLONOSCOPY    . ESOPHAGOGASTRODUODENOSCOPY  N/A 10/22/2016   Procedure: ESOPHAGOGASTRODUODENOSCOPY (EGD);  Surgeon: Doran Stabler, MD;  Location: [facility] ENDOSCOPY;  Service: Endoscopy;  Laterality: N/A;  . FOOT SURGERY     right  . PARTIAL HYSTERECTOMY    . UPPER GASTROINTESTINAL ENDOSCOPY      Family History  Problem Relation Age of Onset  . Breast cancer Mother   . Heart attack Mother   . Heart disease Father   . Alcohol abuse Son   . Colon cancer Neg Hx   . Stomach cancer Neg Hx   . Rectal cancer Neg Hx     Social History   Socioeconomic History  . Marital status: Single    Spouse name: Not on file  . Number of children: 1  . Years of education: Not on file  . Highest education level: 12th grade  Occupational History    Employer: RETIRED  Social Needs  . Financial resource strain: Not on file  . Food insecurity:    Worry: Not on file    Inability: Not on file  . Transportation needs:    Medical: Not on file    Non-medical: Not on file  Tobacco Use  . Smoking status: Never Smoker  . Smokeless tobacco: Never Used  Substance  and Sexual Activity  . Alcohol use: No    Alcohol/week: 0.0 standard drinks  . Drug use: No  . Sexual activity: Never    Birth control/protection: Post-menopausal  Lifestyle  . Physical activity:    Days per week: Not on file    Minutes per session: Not on file  . Stress: Not on file  Relationships  . Social connections:    Talks on phone: Not on file    Gets together: Not on file    Attends religious service: Not on file    Active member of club or organization: Not on file    Attends meetings of clubs or organizations: Not on file    Relationship status: Not on file  . Intimate partner violence:    Fear of current or ex partner: Not on file    Emotionally abused: Not on file    Physically abused: Not on file    Forced sexual activity: Not on file  Other Topics Concern  . Not on file  Social History Narrative   Marital status: divorced.      Children: 1 son; no  grandchildren      Lives: with son      Employment: retired      Tobacco: none      Alcohol: none      Drugs: none       Exercise: none       ADLs:  Independent with ADLs.  Quit driving age 50.  Medical CNA cleans house; makes breakfast; patient pays bills.      Advanced Directives:   YES.  HCPOA:  Son/Isaac Bowman.  FULL CODE; no prolonged measures.    Outpatient Medications Prior to Visit  Medication Sig Dispense Refill  . aspirin 81 MG tablet Take 81 mg by mouth every morning.     . Calcium Carbonate-Vitamin D (CALCIUM-VITAMIN D) 500-200 MG-UNIT tablet Take 3 tablets by mouth every morning.    . Elastic Bandages & Supports ([employer] LUMBAR/BACK SUPPORT BRACE) MISC 1 application by Does not apply route daily. Wear brace for back support 1 each 1  . estradiol (ESTRACE) 0.1 MG/GM vaginal cream INSERT 1 APPLICATORFUL VAGINALLY 1 TIME A WEEK 42.5 g 0  . gabapentin (NEURONTIN) 800 MG tablet Take 1 tablet (800 mg total) by mouth at bedtime. 90 tablet 1  . hydrochlorothiazide (MICROZIDE) 12.5 MG capsule TAKE 1 CAPSULE BY MOUTH EVERY TUESDAY AND SATURDAY 90 capsule 3  . hydrocortisone 2.5 % ointment APPLY EXTERNALLY TO THE AFFECTED AREA TWICE DAILY 28.35 g 0  . hydroxypropyl methylcellulose (ISOPTO TEARS) 2.5 % ophthalmic solution Place 1 drop into both eyes 2 (two) times daily as needed for dry eyes.     Marland Kitchen lactose free nutrition (BOOST) LIQD Take 237 mLs by mouth 2 (two) times daily between meals. (Patient taking differently: Take 1 Container by mouth 2 (two) times daily as needed (nutritional). ) 60 Can 11  . lidocaine (LIDODERM) 5 % Place 1 patch onto the skin daily. Remove & Discard patch within 12 hours or as directed by MD.  Apply to the right side of your neck where it hurts 30 patch 0  . metoprolol succinate (TOPROL-XL) 50 MG 24 hr tablet take 1 tablet by mouth once daily WITH OR IMMEDIATELY FOLLOWING A MEAL 90 tablet 3  . nystatin cream (MYCOSTATIN) APP EXT AA BID FOR 10 DAYS PRN  1  .  pantoprazole (PROTONIX) 40 MG tablet TAKE 1 TABLET BY MOUTH EVERY DAY 30 tablet  5  . promethazine (PHENERGAN) 12.5 MG tablet take 1 tablet by mouth every 6 hours if needed for nausea and vomiting 30 tablet 0  . traMADol-acetaminophen (ULTRACET) 37.5-325 MG tablet Take 1 tablet by mouth every 6 (six) hours.  0  . triamcinolone cream (KENALOG) 0.1 % Apply topically 2 (two) times daily. 30 g 1  . verapamil (CALAN-SR) 240 MG CR tablet Take 1 tablet (240 mg total) by mouth daily. 90 tablet 3  . vitamin C (ASCORBIC ACID) 500 MG tablet Take 500 mg by mouth daily.    . cyclobenzaprine (FLEXERIL) 5 MG tablet TAKE 1/2 TO 1 TABLET BY MOUTH UP TO TWICE DAILY AS NEEDED FOR MUSCLE SPASMS 30 tablet 0  . FLUAD 0.5 ML SUSY ADM 0.5ML IM UTD  0  . Influenza Vac A&B Surf Ant Adj (FLUAD) 0.5 ML SUSY Fluad 2019-20 75yr up(PF)45 mcg(15 mcgx3)/0.5 mL intramuscular syringe  ADM 0.5ML IM UTD     No facility-administered medications prior to visit.     No Known Allergies  ROS Review of Systems  + fatigue +cold intolerance +black emesis + hard stool balls No falls, no panic attacks    Objective:    Physical Exam  BP (!) 157/67 (BP Location: Right Arm, Patient Position: Sitting, Cuff Size: Normal)   Pulse 96   Temp 98.9 F (37.2 C) (Oral)   Resp 17   Ht 4\' 11"  (1.499 m)   Wt 107 lb 6.4 oz (48.7 kg)   SpO2 98%   BMI 21.69 kg/m  Wt Readings from Last 3 Encounters:  01/01/19 107 lb 6.4 oz (48.7 kg)  12/12/18 109 lb 6.4 oz (49.6 kg)  09/23/18 118 lb (53.5 kg)   Physical Exam  Constitutional: Oriented to person, place, and time. Appears well-developed and well-nourished. Stooped posture due to severe kyphoscoliosis HENT:  Head: Normocephalic and atraumatic.  Eyes: Conjunctivae and EOM are normal.  Cardiovascular: Normal rate, regular rhythm, normal heart sounds and intact distal pulses.  No murmur heard. Pulmonary/Chest: Effort normal and breath sounds normal. No stridor. No respiratory distress.  Has no wheezes.  Neurological: Is alert and oriented to person, place, and time.  Skin: Skin is warm. Capillary refill takes less than 2 seconds.  Psychiatric: Has a normal mood and affect. Behavior is normal. Judgment and thought content normal.    Health Maintenance Due  Topic Date Due  . INFLUENZA VACCINE  07/25/2018    There are no preventive care reminders to display for this patient.  Lab Results  Component Value Date   TSH 1.470 04/15/2018   Lab Results  Component Value Date   WBC 5.4 12/12/2018   HGB 9.6 (A) 12/12/2018   HCT 28.7 (A) 12/12/2018   MCV 76.6 12/12/2018   PLT 511 (H) 04/15/2018   Lab Results  Component Value Date   NA 144 12/12/2018   K 4.4 12/12/2018   CO2 27 12/12/2018   GLUCOSE 77 12/12/2018   BUN 18 12/12/2018   CREATININE 0.95 12/12/2018   BILITOT <0.2 12/12/2018   ALKPHOS 56 12/12/2018   AST 13 12/12/2018   ALT 9 12/12/2018   PROT 6.6 12/12/2018   ALBUMIN 3.8 12/12/2018   CALCIUM 9.7 12/12/2018   ANIONGAP 7 10/22/2016   Lab Results  Component Value Date   CHOL 176 05/30/2017   Lab Results  Component Value Date   HDL 82 05/30/2017   Lab Results  Component Value Date   LDLCALC 80 05/30/2017   Lab Results  Component Value  Date   TRIG 70 05/30/2017   Lab Results  Component Value Date   CHOLHDL 2.1 05/30/2017   Lab Results  Component Value Date   HGBA1C 5.4 05/30/2017      Assessment & Plan:   Problem List Items Addressed This Visit      Cardiovascular and Mediastinum   Essential hypertension - Patient's blood pressure is at goal of 139/89 or less. Condition is stable. Continue current medications and treatment plan. I recommend that you exercise as tolerate I also recommend a balanced diet with fruits and vegetables every day, lean meats, and little fried foods. The DASH diet (you can find this online) is a good example of this.     Other Visit Diagnoses    Deficiency anemia    -  Primary Stable anemia, contnue  iron and GI recommendations   Relevant Medications   ferrous sulfate (SM IRON SLOW RELEASE) 160 (50 Fe) MG TBCR SR tablet   Loss of weight    - due to poor dentition   Dizziness    - improved   Loss of appetite          Meds ordered this encounter  Medications  . ferrous sulfate (SM IRON SLOW RELEASE) 160 (50 Fe) MG TBCR SR tablet    Sig: Take 1 tablet (160 mg total) by mouth daily.    Dispense:  30 each    Refill:  6    Follow-up: No follow-ups on file.    Forrest Moron, MD

## 2019-01-02 ENCOUNTER — Telehealth: Payer: Self-pay | Admitting: Family Medicine

## 2019-01-02 LAB — IRON,TIBC AND FERRITIN PANEL
Ferritin: 23 ng/mL (ref 15–150)
Iron Saturation: 39 % (ref 15–55)
Iron: 127 ug/dL (ref 27–139)
Total Iron Binding Capacity: 328 ug/dL (ref 250–450)
UIBC: 201 ug/dL (ref 118–369)

## 2019-01-02 LAB — TSH: TSH: 1.06 u[IU]/mL (ref 0.450–4.500)

## 2019-01-02 NOTE — Telephone Encounter (Signed)
Copied from [place] [id number]. Topic: General - Other >> Jan 02, 2019  9:10 AM Leward Quan A wrote: Reason for CRM: Patient called to request a call back when the results of her urine specimen and lab test are in. Ph# [phone]

## 2019-01-03 ENCOUNTER — Other Ambulatory Visit (INDEPENDENT_AMBULATORY_CARE_PROVIDER_SITE_OTHER): Payer: Medicare Other

## 2019-01-03 ENCOUNTER — Other Ambulatory Visit: Payer: Self-pay

## 2019-01-03 ENCOUNTER — Ambulatory Visit (INDEPENDENT_AMBULATORY_CARE_PROVIDER_SITE_OTHER): Payer: Medicare Other | Admitting: Gastroenterology

## 2019-01-03 VITALS — BP 116/68 | HR 90 | Wt 121.0 lb

## 2019-01-03 DIAGNOSIS — K219 Gastro-esophageal reflux disease without esophagitis: Secondary | ICD-10-CM

## 2019-01-03 DIAGNOSIS — R110 Nausea: Secondary | ICD-10-CM | POA: Diagnosis not present

## 2019-01-03 DIAGNOSIS — R195 Other fecal abnormalities: Secondary | ICD-10-CM

## 2019-01-03 DIAGNOSIS — D649 Anemia, unspecified: Secondary | ICD-10-CM

## 2019-01-03 DIAGNOSIS — K920 Hematemesis: Secondary | ICD-10-CM

## 2019-01-03 LAB — BUN: BUN: 16 mg/dL (ref 6–23)

## 2019-01-03 LAB — HEMOGLOBIN: Hemoglobin: 8.9 g/dL — ABNORMAL LOW (ref 12.0–15.0)

## 2019-01-03 MED ORDER — PANTOPRAZOLE SODIUM 40 MG PO TBEC: 40.0000 mg | DELAYED_RELEASE_TABLET | Freq: Two times a day (BID) | ORAL | 5 refills | Status: DC

## 2019-01-03 NOTE — Patient Instructions (Addendum)
If you are age 83 or older, your body mass index should be between 23-30. Your There is no height or weight on file to calculate BMI. If this is out of the aforementioned range listed, please consider follow up with your Primary Care Provider.  If you are age 67 or younger, your body mass index should be between 19-25. Your There is no height or weight on file to calculate BMI. If this is out of the aformentioned range listed, please consider follow up with your Primary Care Provider.   Increase your Protonix 40mg  to twice a day.  Do not take ibuprofen anymore.  Stop taking aspirin.  Continue iron (ferrous sulfate), 1 tablet once a day with food.  GO TO THE LAB IN THE BASEMENT OF OUR BUILDING BEFORE YOU COME TO THE 4TH FLOOR FOR YOUR PROCEDURE ON Tuesday, 01-07-19.   You have been scheduled for an endoscopy. Please follow written instructions given to you at your visit today. If you use inhalers (even only as needed), please bring them with you on the day of your procedure. Your physician has requested that you go to www.startemmi.com and enter the access code given to you at your visit today. This web site gives a general overview about your procedure. However, you should still follow specific instructions given to you by our office regarding your preparation for the procedure.   Thank you for entrusting me with your care and for choosing [facility], Dr. [place] Cellar

## 2019-01-03 NOTE — Progress Notes (Signed)
HPI :  85 year old female with chronic dyspepsia and history of large hiatal hernia, with history of Cameron lesions in short segment Barrett's esophagus, here for follow-up visit.  She had blood work done about 3 weeks ago was noted to have a Hgb of 9.6, which was down from 10.8 in April. She has endorsed significant reflux symptoms which have bothered her recently, as well as some nausea. To relieve her nausea she induced vomiting 2 times in the past few weeks, she reports there were some dark contents although unable to describe clearly what she saw. She denies any bright red blood. She has been taking iron pills, although sounds like she was taking an over-the-counter formulation and more recently switched to ferrous sulfate. She states she has had a few dark stools in recent weeks however had a brown stool today. She has endorsed using ibuprofen for back pain every day for the past month or so per the recommendation of a family member. She is also been taking her aspirin 81 mg. She is feeling a bit more fatigue than usual but is still functioning and ambulatory. Her son passed away recently who had been taking care of her, she is living at home with a health care aide who spends time with her daily.  Hgb repeated today during office visit, noted to be 8.9. BUN also obtained and normal at 16. Iron studies done 2 days ago were normal. Of note, her last EGD done in 2017 for emesis of "dark material" did not show any clear cause.   Prior workup: EGD 01/17/2016 -1cmsegment of Barrett's esophagus,nodysplasia,7cm hiatal hernia, superficial proximal gastric ulceration, ? Lysbeth Galas lesion, normal remainder of examined stomach and duodenum - biopsies taken to rule out H pylori-  EGD 10/22/2016 - done to evaluate dark emesis, large hiatal hernia, otherwise short segment BE without any pathology noted to cause bleeding  Colonoscopy 2009 - diverticulosis Colonoscopy 02/2010 - stercoral ulcers in the  rectum, diverticulosis, no polyps CT abdomen 2015 - large hiatal hernia, no mass lesions    Past Medical History:  Diagnosis Date  . Abnormal finding on Pap smear, ASCUS   . Acute renal insufficiency   . Amputation finger    index  . Barrett's esophagus   . CAD (coronary artery disease)   . Dementia ([place])   . Diverticulosis   . Elevated BP   . Foot deformity   . GERD (gastroesophageal reflux disease)   . Glaucoma   . H/O: GI bleed   . HSV-2 (herpes simplex virus 2) infection   . Hyperglycemia   . Hypertension   . Migraine   . Osteoporosis    worsening  . Spondylosis      Past Surgical History:  Procedure Laterality Date  . AMPUTATION FINGER / THUMB     secondary to osteomyelitis  . CATARACT EXTRACTION, BILATERAL Bilateral 12/25/2016  . COLONOSCOPY    . ESOPHAGOGASTRODUODENOSCOPY N/A 10/22/2016   Procedure: ESOPHAGOGASTRODUODENOSCOPY (EGD);  Surgeon: Doran Stabler, MD;  Location: [facility] ENDOSCOPY;  Service: Endoscopy;  Laterality: N/A;  . FOOT SURGERY     right  . PARTIAL HYSTERECTOMY    . UPPER GASTROINTESTINAL ENDOSCOPY     Family History  Problem Relation Age of Onset  . Breast cancer Mother   . Heart attack Mother   . Heart disease Father   . Alcohol abuse Son   . Colon cancer Neg Hx   . Stomach cancer Neg Hx   . Rectal cancer Neg Hx  Social History   Tobacco Use  . Smoking status: Never Smoker  . Smokeless tobacco: Never Used  Substance Use Topics  . Alcohol use: No    Alcohol/week: 0.0 standard drinks  . Drug use: No   Current Outpatient Medications  Medication Sig Dispense Refill  . aspirin 81 MG tablet Take 81 mg by mouth every morning.     . Calcium Carbonate-Vitamin D (CALCIUM-VITAMIN D) 500-200 MG-UNIT tablet Take 3 tablets by mouth every morning.    . Elastic Bandages & Supports ([employer] LUMBAR/BACK SUPPORT BRACE) MISC 1 application by Does not apply route daily. Wear brace for back support 1 each 1  . estradiol (ESTRACE) 0.1 MG/GM vaginal  cream INSERT 1 APPLICATORFUL VAGINALLY 1 TIME A WEEK 42.5 g 0  . ferrous sulfate (SM IRON SLOW RELEASE) 160 (50 Fe) MG TBCR SR tablet Take 1 tablet (160 mg total) by mouth daily. 30 each 6  . gabapentin (NEURONTIN) 800 MG tablet Take 1 tablet (800 mg total) by mouth at bedtime. 90 tablet 1  . hydrochlorothiazide (MICROZIDE) 12.5 MG capsule TAKE 1 CAPSULE BY MOUTH EVERY TUESDAY AND SATURDAY 90 capsule 3  . hydrocortisone 2.5 % ointment APPLY EXTERNALLY TO THE AFFECTED AREA TWICE DAILY 28.35 g 0  . hydroxypropyl methylcellulose (ISOPTO TEARS) 2.5 % ophthalmic solution Place 1 drop into both eyes 2 (two) times daily as needed for dry eyes.     Marland Kitchen lactose free nutrition (BOOST) LIQD Take 237 mLs by mouth 2 (two) times daily between meals. (Patient taking differently: Take 1 Container by mouth 2 (two) times daily as needed (nutritional). ) 60 Can 11  . lidocaine (LIDODERM) 5 % Place 1 patch onto the skin daily. Remove & Discard patch within 12 hours or as directed by MD.  Apply to the right side of your neck where it hurts 30 patch 0  . metoprolol succinate (TOPROL-XL) 50 MG 24 hr tablet take 1 tablet by mouth once daily WITH OR IMMEDIATELY FOLLOWING A MEAL 90 tablet 3  . nystatin cream (MYCOSTATIN) APP EXT AA BID FOR 10 DAYS PRN  1  . pantoprazole (PROTONIX) 40 MG tablet Take 1 tablet (40 mg total) by mouth 2 (two) times daily. 60 tablet 5  . promethazine (PHENERGAN) 12.5 MG tablet take 1 tablet by mouth every 6 hours if needed for nausea and vomiting 30 tablet 0  . traMADol-acetaminophen (ULTRACET) 37.5-325 MG tablet Take 1 tablet by mouth every 6 (six) hours.  0  . triamcinolone cream (KENALOG) 0.1 % Apply topically 2 (two) times daily. 30 g 1  . verapamil (CALAN-SR) 240 MG CR tablet Take 1 tablet (240 mg total) by mouth daily. 90 tablet 3  . vitamin C (ASCORBIC ACID) 500 MG tablet Take 500 mg by mouth daily.     No current facility-administered medications for this visit.    No Known  Allergies   Review of Systems: All systems reviewed and negative except where noted in HPI.   Lab Results  Component Value Date   WBC 5.4 12/12/2018   HGB 8.9 Repeated and verified X2. (L) 01/03/2019   HCT 28.7 (A) 12/12/2018   MCV 76.6 12/12/2018   PLT 511 (H) 04/15/2018    CBC Latest Ref Rng & Units 01/03/2019 12/12/2018 04/15/2018  WBC 4.6 - 10.2 K/uL - 5.4 5.2  Hemoglobin 12.0 - 15.0 g/dL 8.9 Repeated and verified X2.(L) 9.6(A) 10.8(L)  Hematocrit 29 - 41 % - 28.7(A) 34.5  Platelets 150 - 379 x10E3/uL - - 511(H)  Lab Results  Component Value Date   CREATININE 0.95 12/12/2018   BUN 16 01/03/2019   NA 144 12/12/2018   K 4.4 12/12/2018   CL 102 12/12/2018   CO2 27 12/12/2018    Lab Results  Component Value Date   ALT 9 12/12/2018   AST 13 12/12/2018   ALKPHOS 56 12/12/2018   BILITOT <0.2 12/12/2018      Physical Exam: BP 116/68   Pulse 90   Wt 121 lb (54.9 kg)   BMI 24.44 kg/m  Constitutional: Pleasant, female in no acute distress. HEENT: Normocephalic and atraumatic. No scleral icterus. Neck supple.  Cardiovascular: Normal rate, regular rhythm.  Pulmonary/chest: Effort normal and breath sounds normal. No wheezing, rales or rhonchi. Abdominal: Soft, nondistended, nontender.  There are no masses palpable. No hepatomegaly. Extremities: no edema Lymphadenopathy: No cervical adenopathy noted. Neurological: Alert and oriented to person place and time. Skin: Skin is warm and dry. No rashes noted. Psychiatric: Normal mood and affect. Behavior is normal.   ASSESSMENT AND PLAN: 85 year old female here for reassessment of the following issues:  Anemia / GERD - patient with recent worsening of anemia although this appears to have downtrended over time. Iron studies normal. In light of her recent worsening of reflux and nausea, and episode of vomiting "dark material" (although difficult for patient to clearly describe contents of her emesis, iron use is  confounding this), in the setting of NSAID use and history of large hiatal hernia, concern for worsening of Cameron lesions or PUD. However, she has not had vomiting for a few days and passed a brown stool today. Her BUN is normal, and I do not think she is actively GI bleeding at this time. Discussed options with her. I recommend she completely stop all NSAIDs, and use Tylenol as needed for her pain. She will also hold her baby aspirin for a few days while this is sorted out. Recommend she increase her Protonix to 40 mg twice a day. She will return to the office on Monday (it is Friday today) for a CBC, and is scheduled for an upper endoscopy with me on Tuesday. I have discussed risks / benefits of EGD with her and she wished to proceed. Otherwise, she has a home health aid with her this weekend. If there is any worsening of her symptoms, passes any melena or recurrent vomiting with concern for GI bleeding, she will go to the hospital. I reviewed these instructions with her several times and she agreed with the plan and verbalized understanding.    [place] Cellar, MD [facility]  CC: Forrest Moron, MD

## 2019-01-03 NOTE — Progress Notes (Signed)
CBC order placed per previous result note from Dr. Havery Moros;

## 2019-01-07 ENCOUNTER — Other Ambulatory Visit: Payer: Self-pay

## 2019-01-07 ENCOUNTER — Telehealth: Payer: Self-pay | Admitting: Gastroenterology

## 2019-01-07 ENCOUNTER — Other Ambulatory Visit (INDEPENDENT_AMBULATORY_CARE_PROVIDER_SITE_OTHER): Payer: Medicare Other

## 2019-01-07 ENCOUNTER — Encounter: Payer: Self-pay | Admitting: Gastroenterology

## 2019-01-07 ENCOUNTER — Ambulatory Visit (AMBULATORY_SURGERY_CENTER): Payer: Medicare Other | Admitting: Gastroenterology

## 2019-01-07 VITALS — BP 109/55 | HR 74 | Temp 98.0°F | Resp 15 | Ht 59.0 in | Wt 121.0 lb

## 2019-01-07 DIAGNOSIS — K449 Diaphragmatic hernia without obstruction or gangrene: Secondary | ICD-10-CM

## 2019-01-07 DIAGNOSIS — K219 Gastro-esophageal reflux disease without esophagitis: Secondary | ICD-10-CM

## 2019-01-07 DIAGNOSIS — K2270 Barrett's esophagus without dysplasia: Secondary | ICD-10-CM | POA: Diagnosis not present

## 2019-01-07 DIAGNOSIS — I10 Essential (primary) hypertension: Secondary | ICD-10-CM | POA: Diagnosis not present

## 2019-01-07 DIAGNOSIS — D649 Anemia, unspecified: Secondary | ICD-10-CM

## 2019-01-07 DIAGNOSIS — K228 Other specified diseases of esophagus: Secondary | ICD-10-CM | POA: Diagnosis not present

## 2019-01-07 DIAGNOSIS — F0390 Unspecified dementia without behavioral disturbance: Secondary | ICD-10-CM | POA: Diagnosis not present

## 2019-01-07 DIAGNOSIS — D508 Other iron deficiency anemias: Secondary | ICD-10-CM

## 2019-01-07 LAB — HEMOGLOBIN: Hemoglobin: 9.5 g/dL — ABNORMAL LOW (ref 12.0–15.0)

## 2019-01-07 MED ORDER — SODIUM CHLORIDE 0.9 % IV SOLN: 500.0000 mL | Freq: Once | INTRAVENOUS | Status: DC

## 2019-01-07 MED ORDER — PANTOPRAZOLE SODIUM 40 MG PO TBEC: 40.0000 mg | DELAYED_RELEASE_TABLET | Freq: Two times a day (BID) | ORAL | 1 refills | Status: DC

## 2019-01-07 MED ORDER — SUCRALFATE 1 GM/10ML PO SUSP: 1.0000 g | Freq: Four times a day (QID) | ORAL | 3 refills | Status: DC

## 2019-01-07 NOTE — Progress Notes (Signed)
Called to room to assist during endoscopic procedure.  Patient ID and intended procedure confirmed with present staff. Received instructions for my participation in the procedure from the performing physician.  

## 2019-01-07 NOTE — Telephone Encounter (Signed)
Pt called in with question about her prep instructions. She has proc today and wants to know if she can take medication now.

## 2019-01-07 NOTE — Telephone Encounter (Signed)
Called and spoke to pt. Questions answered

## 2019-01-07 NOTE — Progress Notes (Signed)
Patient is requesting 90 day supply.

## 2019-01-07 NOTE — Progress Notes (Signed)
Report to PACU, RN, vss, BBS= Clear.  

## 2019-01-07 NOTE — Op Note (Signed)
[place] Patient Name: Ofilia Rayon Procedure Date: 01/07/2019 2:02 PM MRN: [id number] Endoscopist: Remo Lipps P. Havery Moros , MD Age: 85 Referring MD:  Date of Birth: 1933-09-08 Gender: Female Account #: [id number] Procedure:                Upper GI endoscopy Indications:              anemia, prior history of vomiting "dark contents",                            severe gastro-esophageal reflux disease, recent                            history of NSAID use which was stopped, protonix                            recently increased to 40mg  BID Medicines:                Monitored Anesthesia Care Procedure:                Pre-Anesthesia Assessment:                           - Prior to the procedure, a History and Physical                            was performed, and patient medications and                            allergies were reviewed. The patient's tolerance of                            previous anesthesia was also reviewed. The risks                            and benefits of the procedure and the sedation                            options and risks were discussed with the patient.                            All questions were answered, and informed consent                            was obtained. Prior Anticoagulants: The patient has                            taken no previous anticoagulant or antiplatelet                            agents. ASA Grade Assessment: III - A patient with                            severe systemic disease. After reviewing the risks  and benefits, the patient was deemed in                            satisfactory condition to undergo the procedure.                           After obtaining informed consent, the endoscope was                            passed under direct vision. Throughout the                            procedure, the patient's blood pressure, pulse, and                            oxygen saturations  were monitored continuously. The                            Model GIF-[id number]) scope was introduced                            through the mouth, and advanced to the second part                            of duodenum. The upper GI endoscopy was                            accomplished without difficulty. The patient                            tolerated the procedure well. Scope In: Scope Out: Findings:                 Esophagogastric landmarks were identified: the                            Z-line was found at 28 cm, the gastroesophageal                            junction was found at 29 cm and the upper extent of                            the gastric folds was found at 38 cm from the                            incisors.                           A 9 cm hiatal hernia was present.                           LA Grade C (one or more mucosal breaks continuous                            between  tops of 2 or more mucosal folds, less than                            75% circumference) esophagitis was found in the                            distal esophagus.                           There were esophageal mucosal changes classified as                            Barrett's stage C1-2-M1-2 per Prague criteria                            present in the lower third of the esophagus. The                            maximum longitudinal extent of these mucosal                            changes was 1-2 cm in length. Biopsies were taken                            with a cold forceps for histology.                           The exam of the esophagus was otherwise normal.                           The entire examined stomach was normal.                           The duodenal bulb and second portion of the                            duodenum were normal. Complications:            No immediate complications. Estimated blood loss:                            Minimal. Estimated Blood Loss:      Estimated blood loss was minimal. Impression:               - Esophagogastric landmarks identified.                           - 9 cm hiatal hernia.                           - LA Grade C reflux esophagitis.                           - Esophageal mucosal changes classified as  Barrett's stage C1-M1 per Prague criteria. Biopsied.                           - Normal stomach.                           - Normal duodenal bulb and second portion of the                            duodenum.                           Patients symptoms and anemia are due to large                            hiatal hernia, enlarged in size since last seen,                            causing severe GERD in the setting of NSAID use. Recommendation:           - Patient has a contact number available for                            emergencies. The signs and symptoms of potential                            delayed complications were discussed with the                            patient. Return to normal activities tomorrow.                            Written discharge instructions were provided to the                            patient.                           - Resume previous diet.                           - Continue present medications.                           - Stop all NSAIDs (ibuprofen)                           - Continue the higher dose of protonix to 40mg                             twice daily                           - Start liquid carafate 10cc po q 6 hours                           -  Await pathology results.                           - If symptoms do not improve with medical therapy                            may need to consider surgical repair of hiatal                            hernia Remo Lipps P. Farron Lafond, MD 01/07/2019 2:37:53 PM This report has been signed electronically.

## 2019-01-07 NOTE — Patient Instructions (Signed)
YOU HAD AN ENDOSCOPIC PROCEDURE TODAY AT [facility]:   Refer to the procedure report that was given to you for any specific questions about what was found during the examination.  If the procedure report does not answer your questions, please call your gastroenterologist to clarify.  If you requested that your care partner not be given the details of your procedure findings, then the procedure report has been included in a sealed envelope for you to review at your convenience later.  YOU SHOULD EXPECT: Some feelings of bloating in the abdomen. Passage of more gas than usual.  Walking can help get rid of the air that was put into your GI tract during the procedure and reduce the bloating. If you had a lower endoscopy (such as a colonoscopy or flexible sigmoidoscopy) you may notice spotting of blood in your stool or on the toilet paper. If you underwent a bowel prep for your procedure, you may not have a normal bowel movement for a few days.  Please Note:  You might notice some irritation and congestion in your nose or some drainage.  This is from the oxygen used during your procedure.  There is no need for concern and it should clear up in a day or so.  SYMPTOMS TO REPORT IMMEDIATELY:   Following upper endoscopy (EGD)  Vomiting of blood or coffee ground material  New chest pain or pain under the shoulder blades  Painful or persistently difficult swallowing  New shortness of breath  Fever of 100F or higher  Black, tarry-looking stools  For urgent or emergent issues, a gastroenterologist can be reached at any hour by calling [phone].   DIET:  We do recommend a small meal at first, but then you may proceed to your regular diet.  Drink plenty of fluids but you should avoid alcoholic beverages for 24 hours.  MEDICATIONS: Continue present medications. Stop all NSAIDs (non-steroidal anti-inflammatory drugs) such as Ibuprofen. Continue the higher dose of Protonix to 40 mg twice  daily. Start liquid Carafate 10 cc by mouth every 6 hours.  FOLLOW UP: If symptoms do not improve with medical therapy, we may need to consider surgical repair of the hiatal hernia.  Please see handouts given to you by your recovery nurse.  ACTIVITY:  You should plan to take it easy for the rest of today and you should NOT DRIVE or use heavy machinery until tomorrow (because of the sedation medicines used during the test).    FOLLOW UP: Our staff will call the number listed on your records the next business day following your procedure to check on you and address any questions or concerns that you may have regarding the information given to you following your procedure. If we do not reach you, we will leave a message.  However, if you are feeling well and you are not experiencing any problems, there is no need to return our call.  We will assume that you have returned to your regular daily activities without incident.  If any biopsies were taken you will be contacted by phone or by letter within the next 1-3 weeks.  Please call us at [phone] if you have not heard about the biopsies in 3 weeks.   Thank you for allowing [place] to provide for your healthcare needs today.  SIGNATURES/CONFIDENTIALITY: You and/or your care partner have signed paperwork which will be entered into your electronic medical record.  These signatures attest to the fact that that the information  above on your After Visit Summary has been reviewed and is understood.  Full responsibility of the confidentiality of this discharge information lies with you and/or your care-partner. 

## 2019-01-08 ENCOUNTER — Telehealth: Payer: Self-pay

## 2019-01-08 ENCOUNTER — Telehealth: Payer: Self-pay | Admitting: Gastroenterology

## 2019-01-08 ENCOUNTER — Other Ambulatory Visit: Payer: Self-pay

## 2019-01-08 MED ORDER — SUCRALFATE 1 G PO TABS: 1.0000 g | ORAL_TABLET | Freq: Four times a day (QID) | ORAL | 1 refills | Status: DC | PRN

## 2019-01-08 NOTE — Telephone Encounter (Signed)
  Follow up Call-  Call back number 01/07/2019  Post procedure Call Back phone  # [phone]  Permission to leave phone message Yes  Some recent data might be hidden     Patient questions:  Do you have a fever, pain , or abdominal swelling? No. Pain Score  0 *  Have you tolerated food without any problems? Yes.    Have you been able to return to your normal activities? Yes.    Do you have any questions about your discharge instructions: Diet   No. Medications  No. Follow up visit  No.  Do you have questions or concerns about your Care? No.  Actions: * If pain score is 4 or above: No action needed, pain <4.  No problems noted per pt. maw

## 2019-01-08 NOTE — Telephone Encounter (Signed)
Spoke to pharmacy. They had the carafate Rx but it is not covered by her insurance as a suspension.  Resent script as tablets. Pharmacy will instruct pt how to make a slurry

## 2019-01-08 NOTE — Progress Notes (Signed)
Insurance does not cover carafate liquid.  Switched pt to tablet form. Spoke to pharmacy.  They will instruct pt on how to make a slurry prior to taking.

## 2019-01-09 ENCOUNTER — Other Ambulatory Visit: Payer: Self-pay

## 2019-01-09 MED ORDER — SUCRALFATE 1 G PO TABS: 1.0000 g | ORAL_TABLET | Freq: Four times a day (QID) | ORAL | 0 refills | Status: DC | PRN

## 2019-01-09 NOTE — Progress Notes (Signed)
Pt requests 90 day supply

## 2019-01-10 ENCOUNTER — Telehealth: Payer: Self-pay | Admitting: Gastroenterology

## 2019-01-10 NOTE — Telephone Encounter (Signed)
Called and LM for patient with instructions for making a slurry with the carafate tablets. Asked her to call back if she had more questions.

## 2019-01-13 ENCOUNTER — Telehealth: Payer: Self-pay | Admitting: Gastroenterology

## 2019-01-13 NOTE — Telephone Encounter (Signed)
Patient reports that after she had a large BM after she made the call.  She will try Miralax for occasional constipation. She will call back for any additional questions or concerns.

## 2019-01-14 ENCOUNTER — Other Ambulatory Visit: Payer: Self-pay

## 2019-01-14 DIAGNOSIS — D649 Anemia, unspecified: Secondary | ICD-10-CM

## 2019-01-14 NOTE — Progress Notes (Unsigned)
Cbc ordered and due the week of feb 10

## 2019-01-16 NOTE — Progress Notes (Signed)
Saw follow up plan by GI

## 2019-01-16 NOTE — Telephone Encounter (Signed)
Noted  

## 2019-01-22 ENCOUNTER — Telehealth: Payer: Self-pay | Admitting: Gastroenterology

## 2019-01-22 ENCOUNTER — Telehealth: Payer: Self-pay | Admitting: Family Medicine

## 2019-01-22 DIAGNOSIS — M5136 Other intervertebral disc degeneration, lumbar region: Secondary | ICD-10-CM

## 2019-01-22 DIAGNOSIS — M479 Spondylosis, unspecified: Secondary | ICD-10-CM

## 2019-01-22 NOTE — Telephone Encounter (Signed)
Copied from [place] [phone]. Topic: General - Inquiry >> Jan 22, 2019 12:03 PM Conception Chancy, NT wrote: Reason for CRM: patient is calling and requesting a back brace. She states that medicare pays for it. Please advise.

## 2019-01-22 NOTE — Telephone Encounter (Signed)
Called and spoke with patient at length-patient requested information concerning diet management for acid reflux; patient advised of foods to avoid and also mailed a phamplet of information concerning GERD diet; Patient verbalized understanding of information/instructions; Patient was advised to call back if questions/concerns arise;

## 2019-01-22 NOTE — Telephone Encounter (Signed)
Patient wants to speak to nurse regarding what is acceptable for to eat with her having acid reflux.

## 2019-01-24 ENCOUNTER — Ambulatory Visit (INDEPENDENT_AMBULATORY_CARE_PROVIDER_SITE_OTHER): Payer: Medicare Other | Admitting: Family Medicine

## 2019-01-24 ENCOUNTER — Encounter: Payer: Self-pay | Admitting: Family Medicine

## 2019-01-24 ENCOUNTER — Other Ambulatory Visit: Payer: Self-pay

## 2019-01-24 VITALS — BP 145/75 | HR 83 | Temp 97.8°F | Ht <= 58 in | Wt 115.2 lb

## 2019-01-24 DIAGNOSIS — R238 Other skin changes: Secondary | ICD-10-CM

## 2019-01-24 NOTE — Telephone Encounter (Signed)
Please send in back brace if appropriate.

## 2019-01-24 NOTE — Progress Notes (Signed)
Subjective:    Patient ID: [name], female    DOB: 1933-02-23, 85 y.o.   MRN: [id number]  HPI Navil Kole is a 85 y.o. female Presents today for: Chief Complaint  Patient presents with  . burn on back    someone told her yesterday. its from a heating pad.   . back brace request   Presents for possible burn on her back.  History of degenerative disc disease. Has home health aide Noted possible burn form sleeping on a heating pad for years, new one few months ago. No pain on the skin or affected area. Aide noticed it about a week ago.   No treatments to area.  Stopped using heating pad yesterday.     pcp is Forrest Moron, MD Telephone note from January 29th noted. Request for back brace sent to primary care provider to decide.   Patient Active Problem List   Diagnosis Date Noted  . Degenerative disc disease, lumbar 01/01/2018  . Hiatal hernia 11/04/2016  . Acute blood loss anemia   . Gastroesophageal reflux disease without esophagitis 07/31/2014  . Gastric ulcer without hemorrhage or perforation 07/22/2013  . History of syphilis 02/27/2012  . Dementia ([place])   . Spondylosis   . Foot deformity   . Migraine   . CAD (coronary artery disease)   . HSV-2 (herpes simplex virus 2) infection   . Abnormal finding on Pap smear, ASCUS   . Osteoporosis   . CONSTIPATION 12/12/2010  . ANEMIA, IRON DEFICIENCY 06/17/2008  . BARRETTS ESOPHAGUS 06/17/2008  . GLAUCOMA 06/16/2008  . Essential hypertension 06/16/2008  . DIVERTICULOSIS, COLON 06/16/2008  . ARTHRITIS 06/16/2008  . SLEEP APNEA 06/16/2008   Past Medical History:  Diagnosis Date  . Abnormal finding on Pap smear, ASCUS   . Acute renal insufficiency   . Amputation finger    index  . Barrett's esophagus   . CAD (coronary artery disease)   . Dementia ([place])   . Diverticulosis   . Elevated BP   . Foot deformity   . GERD (gastroesophageal reflux disease)   . Glaucoma   . H/O: GI bleed   . HSV-2 (herpes simplex  virus 2) infection   . Hyperglycemia   . Hypertension   . Migraine   . Osteoporosis    worsening  . Spondylosis    Past Surgical History:  Procedure Laterality Date  . AMPUTATION FINGER / THUMB     secondary to osteomyelitis  . CATARACT EXTRACTION, BILATERAL Bilateral 12/25/2016  . COLONOSCOPY    . ESOPHAGOGASTRODUODENOSCOPY N/A 10/22/2016   Procedure: ESOPHAGOGASTRODUODENOSCOPY (EGD);  Surgeon: Doran Stabler, MD;  Location[facility];  Service: Endoscopy;  Laterality: N/A;  . FOOT SURGERY     right  . PARTIAL HYSTERECTOMY    . UPPER GASTROINTESTINAL ENDOSCOPY     No Known Allergies Prior to Admission medications   Medication Sig Start Date End Date Taking? Authorizing Provider  Calcium Carbonate-Vitamin D (CALCIUM-VITAMIN D) 500-200 MG-UNIT tablet Take 3 tablets by mouth every morning.   Yes [provider]  Elastic Bandages & Supports ([employer] LUMBAR/BACK SUPPORT BRACE) MISC 1 application by Does not apply route daily. Wear brace for back support 10/08/18  Yes Stallings, Zoe A, MD  estradiol (ESTRACE) 0.1 MG/GM vaginal cream INSERT 1 APPLICATORFUL VAGINALLY 1 TIME A WEEK 12/06/18  Yes Stallings, Zoe A, MD  ferrous sulfate (SM IRON SLOW RELEASE) 160 (50 Fe) MG TBCR SR tablet Take 1 tablet (160 mg total) by mouth  daily. 01/01/19  Yes Delia Chimes A, MD  gabapentin (NEURONTIN) 800 MG tablet Take 1 tablet (800 mg total) by mouth at bedtime. 06/20/18  Yes Wardell Honour, MD  hydrochlorothiazide (MICROZIDE) 12.5 MG capsule TAKE 1 CAPSULE BY MOUTH EVERY TUESDAY AND SATURDAY 12/12/18  Yes Stallings, Zoe A, MD  hydrocortisone 2.5 % ointment APPLY EXTERNALLY TO THE AFFECTED AREA TWICE DAILY 05/31/18  Yes Wardell Honour, MD  hydroxypropyl methylcellulose (ISOPTO TEARS) 2.5 % ophthalmic solution Place 1 drop into both eyes 2 (two) times daily as needed for dry eyes.    Yes [provider]  lactose free nutrition (BOOST) LIQD Take 237 mLs by mouth 2 (two) times daily between  meals. Patient taking differently: Take 1 Container by mouth 2 (two) times daily as needed (nutritional).  03/22/16  Yes [name], MD  lidocaine (LIDODERM) 5 % Place 1 patch onto the skin daily. Remove & Discard patch within 12 hours or as directed by MD.  Apply to the right side of your neck where it hurts 05/25/18  Yes Blanchie Dessert, MD  metoprolol succinate (TOPROL-XL) 50 MG 24 hr tablet take 1 tablet by mouth once daily WITH OR IMMEDIATELY FOLLOWING A MEAL 12/12/18  Yes Stallings, Zoe A, MD  nystatin cream (MYCOSTATIN) APP EXT AA BID FOR 10 DAYS PRN 08/11/18  Yes [provider]  pantoprazole (PROTONIX) 40 MG tablet Take 1 tablet (40 mg total) by mouth 2 (two) times daily. 01/07/19  Yes Armbruster, Carlota Raspberry, MD  promethazine (PHENERGAN) 12.5 MG tablet take 1 tablet by mouth every 6 hours if needed for nausea and vomiting 11/26/17  Yes Armbruster, Carlota Raspberry, MD  sucralfate (CARAFATE) 1 g tablet Take 1 tablet (1 g total) by mouth every 6 (six) hours as needed. Slowly dissolve tablet in 1 Tablespoon of distilled water for 15 minutes before taking (do not crush) 01/09/19  Yes Armbruster, Carlota Raspberry, MD  traMADol-acetaminophen (ULTRACET) 37.5-325 MG tablet Take 1 tablet by mouth every 6 (six) hours. 10/25/17  Yes [provider]  triamcinolone cream (KENALOG) 0.1 % Apply topically 2 (two) times daily. 09/04/18  Yes Stallings, Zoe A, MD  verapamil (CALAN-SR) 240 MG CR tablet Take 1 tablet (240 mg total) by mouth daily. 12/12/18  Yes Forrest Moron, MD  vitamin C (ASCORBIC ACID) 500 MG tablet Take 500 mg by mouth daily.   Yes [provider]   Social History   Socioeconomic History  . Marital status: Single    Spouse name: Not on file  . Number of children: 1  . Years of education: Not on file  . Highest education level: 12th grade  Occupational History    Employer: RETIRED  Social Needs  . Financial resource strain: Not on file  . Food insecurity:    Worry: Not on  file    Inability: Not on file  . Transportation needs:    Medical: Not on file    Non-medical: Not on file  Tobacco Use  . Smoking status: Never Smoker  . Smokeless tobacco: Never Used  Substance and Sexual Activity  . Alcohol use: No    Alcohol/week: 0.0 standard drinks  . Drug use: No  . Sexual activity: Never    Birth control/protection: Post-menopausal  Lifestyle  . Physical activity:    Days per week: Not on file    Minutes per session: Not on file  . Stress: Not on file  Relationships  . Social connections:    Talks on  phone: Not on file    Gets together: Not on file    Attends religious service: Not on file    Active member of club or organization: Not on file    Attends meetings of clubs or organizations: Not on file    Relationship status: Not on file  . Intimate partner violence:    Fear of current or ex partner: Not on file    Emotionally abused: Not on file    Physically abused: Not on file    Forced sexual activity: Not on file  Other Topics Concern  . Not on file  Social History Narrative   Marital status: divorced.      Children: 1 son; no grandchildren      Lives: with son      Employment: retired      Tobacco: none      Alcohol: none      Drugs: none       Exercise: none       ADLs:  Independent with ADLs.  Quit driving age 29.  Medical CNA cleans house; makes breakfast; patient pays bills.      Advanced Directives:   YES.  HCPOA:  Son/Isaac Bowman.  FULL CODE; no prolonged measures.    Review of Systems No known wounds/discharge.     Objective:   Physical Exam Constitutional:      General: She is not in acute distress.    Appearance: She is well-developed.  HENT:     Head: Normocephalic and atraumatic.  Cardiovascular:     Rate and Rhythm: Normal rate.  Pulmonary:     Effort: Pulmonary effort is normal.  Skin:      Neurological:     Mental Status: She is alert and oriented to person, place, and time.    Vitals:   01/24/19 1614    BP: (!) 145/75  Pulse: 83  Temp: 97.8 F (36.6 C)  TempSrc: Oral  SpO2: 98%  Weight: 115 lb 3.2 oz (52.3 kg)  Height: 4\' 10"  (1.473 m)        Assessment & Plan:  Everlene Cunning is a 85 y.o. female Skin irritation Very faint area of erythema on the left back, potentially could have been a first-degree/superficial burn since she states symptoms were first noted a week ago, but very minimal symptoms at present.  Not painful.  Did not think that Silvadene needed at this time.  -Avoid prolonged use of heating pad  -Polysporin ointment over affected area for the next 3 to 5 days then transition to Eucerin lotion for any dry or irritated/itching areas  - RTC precautions if new or worsening symptoms  No orders of the defined types were placed in this encounter.  Patient Instructions   I do not see any significant burn on your back today, but there is one small area of redness on the left side.  You can apply over-the-counter Polysporin to that area once or twice per day for the next 3-5 days, then can change to over-the-counter Eucerin lotion for any dry or itching areas.  Avoid use of heating pad to affected area more than 20 to 30 minutes at a time in the future.   Return to the clinic or go to the nearest emergency room if any of your symptoms worsen or new symptoms occur.   If you have lab work done today you will be contacted with your lab results within the next 2 weeks.  If you have not heard  from [place] then please contact us. The fastest way to get your results is to register for My Chart.   IF you received an x-ray today, you will receive an invoice from [facility]. Please contact [facility] at [phone] with questions or concerns regarding your invoice.   IF you received labwork today, you will receive an invoice from [facility]. Please contact [facility] at [phone] with questions or concerns regarding your invoice.   Our billing staff will not be able to  assist you with questions regarding bills from these companies.  You will be contacted with the lab results as soon as they are available. The fastest way to get your results is to activate your My Chart account. Instructions are located on the last page of this paperwork. If you have not heard from [place] regarding the results in 2 weeks, please contact this office.       Signed,   Merri Ray, MD [facility] [place].  01/24/19 4:50 PM

## 2019-01-24 NOTE — Patient Instructions (Addendum)
I do not see any significant burn on your back today, but there is one small area of redness on the left side.  You can apply over-the-counter Polysporin to that area once or twice per day for the next 3-5 days, then can change to over-the-counter Eucerin lotion for any dry or itching areas.  Avoid use of heating pad to affected area more than 20 to 30 minutes at a time in the future.   Return to the clinic or go to the nearest emergency room if any of your symptoms worsen or new symptoms occur.   If you have lab work done today you will be contacted with your lab results within the next 2 weeks.  If you have not heard from [place] then please contact us. The fastest way to get your results is to register for My Chart.   IF you received an x-ray today, you will receive an invoice from [facility]. Please contact [facility] at [phone] with questions or concerns regarding your invoice.   IF you received labwork today, you will receive an invoice from [place]. Please contact [facility] at [phone] with questions or concerns regarding your invoice.   Our billing staff will not be able to assist you with questions regarding bills from these companies.  You will be contacted with the lab results as soon as they are available. The fastest way to get your results is to activate your My Chart account. Instructions are located on the last page of this paperwork. If you have not heard from [place] regarding the results in 2 weeks, please contact this office.

## 2019-01-27 ENCOUNTER — Telehealth: Payer: Self-pay | Admitting: Family Medicine

## 2019-01-27 MED ORDER — LUMBAR BACK BRACE/SUPPORT PAD MISC: 1 refills | Status: DC

## 2019-01-27 NOTE — Telephone Encounter (Signed)
Copied from [place] [id number]. Topic: General - Inquiry >> Jan 22, 2019 12:03 PM Conception Chancy, NT wrote: Reason for CRM: patient is calling and requesting a back brace. She states that medicare pays for it. Please advise. >> Jan 27, 2019  9:26 AM Leward Quan A wrote: Patient called to inquire about getting the back brace. States that it is an emergency and she is almost touching the floor. She say that she is waiting to hear from someone on when she will be getting this back brace. Please advise

## 2019-01-27 NOTE — Telephone Encounter (Signed)
Please advise 

## 2019-01-27 NOTE — Telephone Encounter (Signed)
Please send in request for back brace to [facility] or other medical supply company. Please fax where appropriate.

## 2019-01-28 ENCOUNTER — Telehealth: Payer: Self-pay | Admitting: Gastroenterology

## 2019-01-28 NOTE — Telephone Encounter (Signed)
Pt requeted a CB concerning questions she has about Carafate.

## 2019-01-28 NOTE — Telephone Encounter (Signed)
Pt is calling back in regards of this.

## 2019-01-28 NOTE — Telephone Encounter (Signed)
Spoke with [facility] and left message to return my call as I am trying to get a back brace for my patient. Dgaddy, CMA

## 2019-01-28 NOTE — Telephone Encounter (Signed)
PT called back about the medication

## 2019-01-28 NOTE — Telephone Encounter (Signed)
Called and spoke to pt. Dr. Havery Moros let me know that Dr. Bryan Lemma had spoken with the pt last night about her Carafate. However She had more questions.  She was concerned about why she had so many pills (insurance requires 90 day supply).  And why she is taking it. She is taking bid but I told her she can take it prn. She may decide to try it a few days without because she is feeling so well these days.  I explained that Protonix is NOT prn. She needs to take that bid.  But carafate q6h prn but to simplify she takes it bid. We also discussed at length the foods Dr. Havery Moros does not want her to have (only limitation he wants her to have is no tomato juice and anything with tomatoes in it like spaghetti sauce, salsa, etc.  She understands not to eat within 2 hours of bedtime.  Pt asked that I send her a letter that stipulates exactly what Dr. Havery Moros says she can't have.

## 2019-01-29 ENCOUNTER — Encounter (HOSPITAL_COMMUNITY): Payer: Self-pay | Admitting: Emergency Medicine

## 2019-01-29 ENCOUNTER — Ambulatory Visit (HOSPITAL_COMMUNITY)
Admission: EM | Admit: 2019-01-29 | Discharge: 2019-01-29 | Disposition: A | Discharge status: Home / Self Care | Payer: Medicare Other | Attending: Emergency Medicine | Admitting: Emergency Medicine

## 2019-01-29 DIAGNOSIS — L309 Dermatitis, unspecified: Secondary | ICD-10-CM | POA: Diagnosis not present

## 2019-01-29 DIAGNOSIS — H6123 Impacted cerumen, bilateral: Secondary | ICD-10-CM

## 2019-01-29 MED ORDER — ZINC OXIDE 10 % EX CREA: TOPICAL_CREAM | CUTANEOUS | 0 refills | Status: DC

## 2019-01-29 MED ORDER — OFLOXACIN 0.3 % OP SOLN: OPHTHALMIC | 0 refills | Status: DC

## 2019-01-29 NOTE — Discharge Instructions (Signed)
Earwax removed today.  Some skin irritation to the right ear canal that is causing bleeding.  Use ofloxacin as directed to prevent infection.  Please follow-up with your primary care doctor as scheduled for reevaluation.  Please apply zinc oxide to the affected area.  There is no infection to the skin at this time.  However, moisture from the skin fold could be causing the irritation.  The zinc oxide will prevent further irritation.  Please monitor for spreading redness, increased warmth, fever, follow-up for reevaluation.

## 2019-01-29 NOTE — Telephone Encounter (Signed)
Left message on voicemail to return office call. Dgaddy, CMA 

## 2019-01-29 NOTE — ED Triage Notes (Signed)
Pt here requesting her ears to be cleaned out

## 2019-01-29 NOTE — Telephone Encounter (Signed)
[facility]) returned my call to advise back brace is not billable to [employer].  Will contact pt and advise. Dgaddy, CMA

## 2019-01-29 NOTE — ED Provider Notes (Signed)
[place]    CSN: [id number] Arrival date & time: 01/29/19  1328     History   Chief Complaint Chief Complaint  Patient presents with  . Ear Fullness    HPI Oriya Kettering is a 85 y.o. female.   85 year old female comes in for cerumen impaction. States "I cannot hear, I must have a lot of ear wax". She denies pain, drainage. Denies URI symptoms such as cough, congestion, sore throat. Denies fever, chills, night sweats.  She is also complaining of rash to the abdomen area, she denies pain. Denies spreading erythema, warmth, fever. She states her aid told her there was some discoloring to the area. She does not know if the rash is spreading as she has not looked at it. Denies itching.      Past Medical History:  Diagnosis Date  . Abnormal finding on Pap smear, ASCUS   . Acute renal insufficiency   . Amputation finger    index  . Barrett's esophagus   . CAD (coronary artery disease)   . Dementia ([place])   . Diverticulosis   . Elevated BP   . Foot deformity   . GERD (gastroesophageal reflux disease)   . Glaucoma   . H/O: GI bleed   . HSV-2 (herpes simplex virus 2) infection   . Hyperglycemia   . Hypertension   . Migraine   . Osteoporosis    worsening  . Spondylosis     Patient Active Problem List   Diagnosis Date Noted  . Degenerative disc disease, lumbar 01/01/2018  . Hiatal hernia 11/04/2016  . Acute blood loss anemia   . Gastroesophageal reflux disease without esophagitis 07/31/2014  . Gastric ulcer without hemorrhage or perforation 07/22/2013  . History of syphilis 02/27/2012  . Dementia ([place])   . Spondylosis   . Foot deformity   . Migraine   . CAD (coronary artery disease)   . HSV-2 (herpes simplex virus 2) infection   . Abnormal finding on Pap smear, ASCUS   . Osteoporosis   . CONSTIPATION 12/12/2010  . ANEMIA, IRON DEFICIENCY 06/17/2008  . BARRETTS ESOPHAGUS 06/17/2008  . GLAUCOMA 06/16/2008  . Essential hypertension 06/16/2008  .  DIVERTICULOSIS, COLON 06/16/2008  . ARTHRITIS 06/16/2008  . SLEEP APNEA 06/16/2008    Past Surgical History:  Procedure Laterality Date  . AMPUTATION FINGER / THUMB     secondary to osteomyelitis  . CATARACT EXTRACTION, BILATERAL Bilateral 12/25/2016  . COLONOSCOPY    . ESOPHAGOGASTRODUODENOSCOPY N/A 10/22/2016   Procedure: ESOPHAGOGASTRODUODENOSCOPY (EGD);  Surgeon: Doran Stabler, MD;  Location: [facility] ENDOSCOPY;  Service: Endoscopy;  Laterality: N/A;  . FOOT SURGERY     right  . PARTIAL HYSTERECTOMY    . UPPER GASTROINTESTINAL ENDOSCOPY      OB History   No obstetric history on file.      Home Medications    Prior to Admission medications   Medication Sig Start Date End Date Taking? Authorizing Provider  Calcium Carbonate-Vitamin D (CALCIUM-VITAMIN D) 500-200 MG-UNIT tablet Take 3 tablets by mouth every morning.    [provider]  Elastic Bandages & Supports (LUMBAR BACK BRACE/SUPPORT PAD) MISC Dispense one back brace to be worn while doing activities as needed daily 01/27/19   Delia Chimes A, MD  estradiol (ESTRACE) 0.1 MG/GM vaginal cream INSERT 1 APPLICATORFUL VAGINALLY 1 TIME A WEEK 12/06/18   Stallings, Zoe A, MD  ferrous sulfate (SM IRON SLOW RELEASE) 160 (50 Fe) MG TBCR SR tablet Take  1 tablet (160 mg total) by mouth daily. 01/01/19   Forrest Moron, MD  gabapentin (NEURONTIN) 800 MG tablet Take 1 tablet (800 mg total) by mouth at bedtime. 06/20/18   Wardell Honour, MD  hydrochlorothiazide (MICROZIDE) 12.5 MG capsule TAKE 1 CAPSULE BY MOUTH EVERY TUESDAY AND SATURDAY 12/12/18   Delia Chimes A, MD  hydrocortisone 2.5 % ointment APPLY EXTERNALLY TO THE AFFECTED AREA TWICE DAILY 05/31/18   Wardell Honour, MD  hydroxypropyl methylcellulose (ISOPTO TEARS) 2.5 % ophthalmic solution Place 1 drop into both eyes 2 (two) times daily as needed for dry eyes.     [provider]  lactose free nutrition (BOOST) LIQD Take 237 mLs by mouth 2 (two) times daily between  meals. Patient taking differently: Take 1 Container by mouth 2 (two) times daily as needed (nutritional).  03/22/16   [name], MD  lidocaine (LIDODERM) 5 % Place 1 patch onto the skin daily. Remove & Discard patch within 12 hours or as directed by MD.  Apply to the right side of your neck where it hurts 05/25/18   Blanchie Dessert, MD  metoprolol succinate (TOPROL-XL) 50 MG 24 hr tablet take 1 tablet by mouth once daily WITH OR IMMEDIATELY FOLLOWING A MEAL 12/12/18   Stallings, Zoe A, MD  nystatin cream (MYCOSTATIN) APP EXT AA BID FOR 10 DAYS PRN 08/11/18   [provider]  ofloxacin (OCUFLOX) 0.3 % ophthalmic solution 10 drops in right ear once daily for 5 days. 01/29/19   Tasia Catchings, Lennis Korb V, PA-C  pantoprazole (PROTONIX) 40 MG tablet Take 1 tablet (40 mg total) by mouth 2 (two) times daily. 01/07/19   Armbruster, Carlota Raspberry, MD  promethazine (PHENERGAN) 12.5 MG tablet take 1 tablet by mouth every 6 hours if needed for nausea and vomiting 11/26/17   Armbruster, Carlota Raspberry, MD  sucralfate (CARAFATE) 1 g tablet Take 1 tablet (1 g total) by mouth every 6 (six) hours as needed. Slowly dissolve tablet in 1 Tablespoon of distilled water for 15 minutes before taking (do not crush) 01/09/19   Armbruster, Carlota Raspberry, MD  traMADol-acetaminophen (ULTRACET) 37.5-325 MG tablet Take 1 tablet by mouth every 6 (six) hours. 10/25/17   [provider]  triamcinolone cream (KENALOG) 0.1 % Apply topically 2 (two) times daily. 09/04/18   Forrest Moron, MD  verapamil (CALAN-SR) 240 MG CR tablet Take 1 tablet (240 mg total) by mouth daily. 12/12/18   Forrest Moron, MD  vitamin C (ASCORBIC ACID) 500 MG tablet Take 500 mg by mouth daily.    [provider]  ZINC OXIDE, TOPICAL, 10 % CREA 1 application to affected area to prevent moisture and further irritation of skin. 01/29/19   Ok Edwards, PA-C    Family History Family History  Problem Relation Age of Onset  . Breast cancer Mother   . Heart attack Mother     . Heart disease Father   . Alcohol abuse Son   . Colon cancer Neg Hx   . Stomach cancer Neg Hx   . Rectal cancer Neg Hx     Social History Social History   Tobacco Use  . Smoking status: Never Smoker  . Smokeless tobacco: Never Used  Substance Use Topics  . Alcohol use: No    Alcohol/week: 0.0 standard drinks  . Drug use: No     Allergies   Patient has no known allergies.   Review of Systems Review of Systems  Reason unable to perform  ROS: See HPI as above.     Physical Exam Triage Vital Signs ED Triage Vitals  Enc Vitals Group     BP 01/29/19 1351 131/70     Pulse Rate 01/29/19 1351 84     Resp 01/29/19 1351 18     Temp 01/29/19 1351 98.3 F (36.8 C)     Temp Source 01/29/19 1351 Temporal     SpO2 01/29/19 1351 98 %     Weight --      Height --      Head Circumference --      Peak Flow --      Pain Score 01/29/19 1352 0     Pain Loc --      Pain Edu? --      Excl. in [place]? --    No data found.  Updated Vital Signs BP 131/70 (BP Location: Left Arm)   Pulse 84   Temp 98.3 F (36.8 C) (Temporal)   Resp 18   SpO2 98%   Physical Exam Constitutional:      General: She is not in acute distress.    Appearance: She is well-developed. She is not diaphoretic.  HENT:     Head: Normocephalic and atraumatic.     Ears:     Comments: Bilateral cerumen impaction, TM not visible.  Post ear irrigation. TM visible, pearly grey without erythema, bulging, perforation. Slight bleeding to the right ear canal from irrigation.  Eyes:     Conjunctiva/sclera: Conjunctivae normal.     Pupils: Pupils are equal, round, and reactive to light.  Skin:    Comments: Slight erythema to the skin fold from left abdomen. No tenderness to palpation. No warmth. No satellite lesions, skin tears, blisters.   Neurological:     Mental Status: She is alert and oriented to person, place, and time.      UC Treatments / Results  Labs (all labs ordered are listed, but only abnormal  results are displayed) Labs Reviewed - No data to display  EKG None  Radiology No results found.  Procedures Procedures (including critical care time)  Medications Ordered in UC Medications - No data to display  Initial Impression / Assessment and Plan / UC Course  I have reviewed the triage vital signs and the nursing notes.  Pertinent labs & imaging results that were available during my care of the patient were reviewed by me and considered in my medical decision making (see chart for details).    Patient with improved symptoms afte your irrigation.  Tympanic membrane pearly gray without erythema, bulging, perforation.  Patient did have slight bleeding in the right ear canal post ear irrigation.  Will start ofloxacin to prevent otitis externa.  Patient with slight erythema to the skin fold from abdomen on the left side.  No signs of yeast infection, cellulitis.  Will have patient apply zinc oxide to prevent complications.  Return precautions given.  Patient expresses understanding and agrees to plan.  Final Clinical Impressions(s) / UC Diagnoses   Final diagnoses:  Bilateral impacted cerumen  Dermatitis    ED Prescriptions    Medication Sig Dispense Auth. Provider   ofloxacin (OCUFLOX) 0.3 % ophthalmic solution 10 drops in right ear once daily for 5 days. 5 mL Kamarie Veno V, PA-C   ZINC OXIDE, TOPICAL, 10 % CREA 1 application to affected area to prevent moisture and further irritation of skin. 50 g Ok Edwards, PA-C       [place] Jeff V,  PA-C 01/29/19 1614

## 2019-01-30 NOTE — Telephone Encounter (Signed)
Copied from [place] [id number]. Topic: General - Inquiry >> Jan 22, 2019 12:03 PM Conception Chancy, NT wrote: Reason for CRM: patient is calling and requesting a back brace. She states that medicare pays for it. Please advise. >> Jan 27, 2019  9:26 AM Leward Quan A wrote: Patient called to inquire about getting the back brace. States that it is an emergency and she is almost touching the floor. She say that she is waiting to hear from someone on when she will be getting this back brace. Please advise >> Jan 30, 2019  3:39 PM Mcneil, Jacinto Reap wrote: Pt stated she can hardly stand up and she has not heard from anyone regarding her request for a back brace. Pt requests call back.

## 2019-01-30 NOTE — Telephone Encounter (Signed)
Pt is calling in again to request a back brace. Pt says that she and provider Nolon Rod discussed this in her ov. Pt says that her back is getting worse, pt says at this point she can hardly stand up straight. Pt says that she is now just in a recliner to have comfort.   Pt would really like help with this.    Please advise.

## 2019-02-01 NOTE — Telephone Encounter (Signed)
Please advise 

## 2019-02-01 NOTE — Telephone Encounter (Signed)
Pt called again wanting to know why Dr. Has not given the "ok" to get a back brace? She would like someone to call her. I see that a message was left. Pt must not have received it.   Please advise.

## 2019-02-03 ENCOUNTER — Telehealth: Payer: Self-pay | Admitting: Family Medicine

## 2019-02-03 NOTE — Telephone Encounter (Signed)
Copied from [place] [id number]. Topic: General - Other >> Feb 03, 2019 12:05 PM Windy Kalata wrote: Reason for CRM: Patient is calling about needing a back brace and it needs to be where they accept Medicaid, please advise  Best call back is [phone]

## 2019-02-03 NOTE — Telephone Encounter (Signed)
Pt is calling back concerning back brace.She states it is hard for her to stand up.She does not understand why this has not been taken care of

## 2019-02-04 ENCOUNTER — Other Ambulatory Visit (INDEPENDENT_AMBULATORY_CARE_PROVIDER_SITE_OTHER): Payer: Medicare Other

## 2019-02-04 DIAGNOSIS — D649 Anemia, unspecified: Secondary | ICD-10-CM

## 2019-02-04 LAB — CBC WITH DIFFERENTIAL/PLATELET
BASOS PCT: 1.4 % (ref 0.0–3.0)
Basophils Absolute: 0.1 10*3/uL (ref 0.0–0.1)
EOS PCT: 4 % (ref 0.0–5.0)
Eosinophils Absolute: 0.2 10*3/uL (ref 0.0–0.7)
HCT: 35.2 % — ABNORMAL LOW (ref 36.0–46.0)
HEMOGLOBIN: 11.4 g/dL — AB (ref 12.0–15.0)
Lymphocytes Relative: 17.2 % (ref 12.0–46.0)
Lymphs Abs: 0.8 10*3/uL (ref 0.7–4.0)
MCHC: 32.3 g/dL (ref 30.0–36.0)
MCV: 81.2 fl (ref 78.0–100.0)
Monocytes Absolute: 0.3 10*3/uL (ref 0.1–1.0)
Monocytes Relative: 6.4 % (ref 3.0–12.0)
Neutro Abs: 3.5 10*3/uL (ref 1.4–7.7)
Neutrophils Relative %: 71 % (ref 43.0–77.0)
Platelets: 402 10*3/uL — ABNORMAL HIGH (ref 150.0–400.0)
RBC: 4.33 Mil/uL (ref 3.87–5.11)
RDW: 19 % — ABNORMAL HIGH (ref 11.5–15.5)
WBC: 4.9 10*3/uL (ref 4.0–10.5)

## 2019-02-04 NOTE — Telephone Encounter (Signed)
Order for back brace sent via fax to [facility] at [phone] and confirmation received. Dgaddy, CMA

## 2019-02-05 NOTE — Telephone Encounter (Signed)
Spoke with pt via phone advised back brace is not billable with medicare.  Pt is willing to pay out of pocket I advised back brace will not straighten her back but pt says she knows but she wants it to feel secure so she won't fall. Per pt she using towels now for stability.  Advised I would contact [facility] to get a price for her and contact her back as soon as I could.  Pt agreeable. Dgaddy, CMA

## 2019-02-06 NOTE — Telephone Encounter (Signed)
Sent letter to pt with specific information regarding foods Dr. Loni Muse wants her to avoid.

## 2019-02-07 ENCOUNTER — Other Ambulatory Visit: Payer: Self-pay

## 2019-02-07 DIAGNOSIS — D508 Other iron deficiency anemias: Secondary | ICD-10-CM

## 2019-02-14 ENCOUNTER — Ambulatory Visit (INDEPENDENT_AMBULATORY_CARE_PROVIDER_SITE_OTHER): Payer: Medicare Other | Admitting: Gastroenterology

## 2019-02-14 ENCOUNTER — Encounter: Payer: Self-pay | Admitting: Gastroenterology

## 2019-02-14 VITALS — BP 118/72 | Ht <= 58 in | Wt 105.0 lb

## 2019-02-14 DIAGNOSIS — K2270 Barrett's esophagus without dysplasia: Secondary | ICD-10-CM

## 2019-02-14 DIAGNOSIS — K449 Diaphragmatic hernia without obstruction or gangrene: Secondary | ICD-10-CM

## 2019-02-14 DIAGNOSIS — K2100 Gastro-esophageal reflux disease with esophagitis, without bleeding: Secondary | ICD-10-CM

## 2019-02-14 DIAGNOSIS — D649 Anemia, unspecified: Secondary | ICD-10-CM

## 2019-02-14 NOTE — Patient Instructions (Signed)
If you are age 83 or older, your body mass index should be between 23-30. Your There is no height or weight on file to calculate BMI. If this is out of the aforementioned range listed, please consider follow up with your Primary Care Provider.  If you are age 23 or younger, your body mass index should be between 19-25. Your There is no height or weight on file to calculate BMI. If this is out of the aformentioned range listed, please consider follow up with your Primary Care Provider.     Thank you for entrusting me with your care and for choosing [facility], Dr. [place] Cellar

## 2019-02-14 NOTE — Progress Notes (Signed)
HPI :  85 year old female with chronic dyspepsia and history of large hiatal hernia, with history of [place] lesions in short segment Barrett's esophagus, here for follow-up visit.  At her last visit she was noted to have worsening anemia, Hgb to 8.9 down from high 10s. She had nausea / vomiting, severe reflux symptoms, and had endorsed some dark emesis / and dark stools. She had an EGD with me on 01/07/2019 which showed a 9cm hiatal hernia, along with LA grade C esophagitis, and a suspect C1M2 segment of Barrett's esophagus. She was placed on protonix 40mg  twice daily and added liquid carafate to her regimen and to stop all NSAIDs.  Biopsies were c/w Barrett's but without any dysplasia. She had a repeat CBC showing Hgb went from low of 8.9 and back up to 11.4, done on 2/11.   She overall has been significantly improved since I have seen her. Her reflux / pyrosis has completely resolved. She denies any further abdominal pain or vomiting. She is compliant with protonix twice daily, she asks how frequently she needs to take the carafate. She denies any dark or tarry stools. She is accompanied by a family friend today. Her son passed away recently who had been taking care of her, she is living at home with a health care aide who spends time with her daily.  Prior workup: EGD 01/17/2016 -1cmsegment of Barrett's esophagus,nodysplasia,7cm hiatal hernia, superficial proximal gastric ulceration, ? Lysbeth Galas lesion, normal remainder of examined stomach and duodenum - biopsies taken to rule out H pylori-  EGD 10/22/2016 - done to evaluate dark emesis, large hiatal hernia, otherwise short segment BE without any pathology noted to cause bleeding EGD 01/07/2019 - 9cm hiatal hernia, along with LA grade C esophagitis, and a C1M2 segment of Barrett's esophagus  Colonoscopy 2009 - diverticulosis Colonoscopy 02/2010 - stercoral ulcers in the rectum, diverticulosis, no polyps CT abdomen 2015 - large hiatal hernia,  no mass lesions   Past Medical History:  Diagnosis Date  . Abnormal finding on Pap smear, ASCUS   . Acute renal insufficiency   . Amputation finger    index  . Barrett's esophagus   . CAD (coronary artery disease)   . Dementia ([place])   . Diverticulosis   . Elevated BP   . Foot deformity   . GERD (gastroesophageal reflux disease)   . Glaucoma   . H/O: GI bleed   . HSV-2 (herpes simplex virus 2) infection   . Hyperglycemia   . Hypertension   . Migraine   . Osteoporosis    worsening  . Spondylosis      Past Surgical History:  Procedure Laterality Date  . AMPUTATION FINGER / THUMB     secondary to osteomyelitis  . CATARACT EXTRACTION, BILATERAL Bilateral 12/25/2016  . COLONOSCOPY    . ESOPHAGOGASTRODUODENOSCOPY N/A 10/22/2016   Procedure: ESOPHAGOGASTRODUODENOSCOPY (EGD);  Surgeon: Doran Stabler, MD;  [facility] END[facility]COPY;  Service: Endoscopy;  Laterality: N/A;  . FOOT SURGERY     right  . PARTIAL HYSTERECTOMY    . UPPER GASTROINTESTINAL ENDOSCOPY     Family History  Problem Relation Age of Onset  . Breast cancer Mother   . Heart attack Mother   . Heart disease Father   . Alcohol abuse Son   . Colon cancer Neg Hx   . Stomach cancer Neg Hx   . Rectal cancer Neg Hx    Social History   Tobacco Use  . Smoking status: Never Smoker  .  Smokeless tobacco: Never Used  Substance Use Topics  . Alcohol use: No    Alcohol/week: 0.0 standard drinks  . Drug use: No   Current Outpatient Medications  Medication Sig Dispense Refill  . Calcium Carbonate-Vitamin D (CALCIUM-VITAMIN D) 500-200 MG-UNIT tablet Take 3 tablets by mouth every morning.    Water engineer Bandages & Supports (LUMBAR BACK BRACE/SUPPORT PAD) MISC Dispense one back brace to be worn while doing activities as needed daily 1 each 1  . estradiol (ESTRACE) 0.1 MG/GM vaginal cream INSERT 1 APPLICATORFUL VAGINALLY 1 TIME A WEEK 42.5 g 0  . ferrous sulfate (SM IRON SLOW RELEASE) 160 (50 Fe) MG TBCR SR tablet  Take 1 tablet (160 mg total) by mouth daily. 30 each 6  . gabapentin (NEURONTIN) 800 MG tablet Take 1 tablet (800 mg total) by mouth at bedtime. 90 tablet 1  . hydrochlorothiazide (MICROZIDE) 12.5 MG capsule TAKE 1 CAPSULE BY MOUTH EVERY TUESDAY AND SATURDAY 90 capsule 3  . hydrocortisone 2.5 % ointment APPLY EXTERNALLY TO THE AFFECTED AREA TWICE DAILY 28.35 g 0  . hydroxypropyl methylcellulose (ISOPTO TEARS) 2.5 % ophthalmic solution Place 1 drop into both eyes 2 (two) times daily as needed for dry eyes.     Marland Kitchen lactose free nutrition (BOOST) LIQD Take 237 mLs by mouth 2 (two) times daily between meals. (Patient taking differently: Take 1 Container by mouth 2 (two) times daily as needed (nutritional). ) 60 Can 11  . lidocaine (LIDODERM) 5 % Place 1 patch onto the skin daily. Remove & Discard patch within 12 hours or as directed by MD.  Apply to the right side of your neck where it hurts 30 patch 0  . metoprolol succinate (TOPROL-XL) 50 MG 24 hr tablet take 1 tablet by mouth once daily WITH OR IMMEDIATELY FOLLOWING A MEAL 90 tablet 3  . nystatin cream (MYCOSTATIN) APP EXT AA BID FOR 10 DAYS PRN  1  . ofloxacin (OCUFLOX) 0.3 % ophthalmic solution 10 drops in right ear once daily for 5 days. 5 mL 0  . pantoprazole (PROTONIX) 40 MG tablet Take 1 tablet (40 mg total) by mouth 2 (two) times daily. 180 tablet 1  . promethazine (PHENERGAN) 12.5 MG tablet take 1 tablet by mouth every 6 hours if needed for nausea and vomiting 30 tablet 0  . sucralfate (CARAFATE) 1 g tablet Take 1 tablet (1 g total) by mouth every 6 (six) hours as needed. Slowly dissolve tablet in 1 Tablespoon of distilled water for 15 minutes before taking (do not crush) 360 tablet 0  . traMADol-acetaminophen (ULTRACET) 37.5-325 MG tablet Take 1 tablet by mouth every 6 (six) hours.  0  . triamcinolone cream (KENALOG) 0.1 % Apply topically 2 (two) times daily. 30 g 1  . verapamil (CALAN-SR) 240 MG CR tablet Take 1 tablet (240 mg total) by mouth  daily. 90 tablet 3  . vitamin C (ASCORBIC ACID) 500 MG tablet Take 500 mg by mouth daily.    Marland Kitchen ZINC OXIDE, TOPICAL, 10 % CREA 1 application to affected area to prevent moisture and further irritation of skin. 50 g 0   No current facility-administered medications for this visit.    No Known Allergies   Review of Systems: All systems reviewed and negative except where noted in HPI.   Lab Results  Component Value Date   WBC 4.9 02/04/2019   HGB 11.4 (L) 02/04/2019   HCT 35.2 (L) 02/04/2019   MCV 81.2 02/04/2019   PLT 402.0 (H) 02/04/2019  CBC Latest Ref Rng & Units 02/04/2019 01/07/2019 01/03/2019  WBC 4.0 - 10.5 K/uL 4.9 - -  Hemoglobin 12.0 - 15.0 g/dL 11.4(L) 9.5(L) 8.9 Repeated and verified X2.(L)  Hematocrit 36.0 - 46.0 % 35.2(L) - -  Platelets 150.0 - 400.0 K/uL 402.0(H) - -     Physical Exam: BP 118/72   Ht 4\' 10"  (1.473 m)   Wt 105 lb (47.6 kg)   BMI 21.95 kg/m  Constitutional: Pleasant, female in no acute distress, using cane to walk HEENT: Normocephalic and atraumatic. Conjunctivae are normal. No scleral icterus. Neck supple.  Cardiovascular: Normal rate, regular rhythm.  Pulmonary/chest: Effort normal and breath sounds normal. No wheezing, rales or rhonchi. Abdominal: Soft, nondistended, nontender. There are no masses palpable. No hepatomegaly. Extremities: no edema Lymphadenopathy: No cervical adenopathy noted. Neurological: Alert and oriented to person place and time. Skin: Skin is warm and dry. No rashes noted. Psychiatric: Normal mood and affect. Behavior is normal.   ASSESSMENT AND PLAN: 85 year old female here for reassessment of the following issues:  Esophagitis / Barrett's esophagus / hiatal hernia / anemia - patient with a very large hiatal hernia that predisposes her to GERD and Barrett's. Her hemoglobin had dropped in the setting of severe esophagitis, she was placed on high-dose Protonix 40 g twice daily and her symptoms have for the most part  resolved. Her hemoglobin has intervally gone up a few point since it was checked in general she doing really well. I discussed options with her long-term. I think if she comes off PPI she will likely have significant symptoms that bother her and also be a risk for recurrent esophagitis and anemia. In light of her Barrett's esophagus, which is a sort segment, recommend indefinite Protonix to reduce the risk for esophageal cancer. Given her age and comorbidities I don't think further surveillance endoscopy is warranted, but she can follow up with me periodically. I counseled her she needs to be compliant with her Protonix, but she can use the Carafate as needed, she should not be taking that as a scheduled medication any longer. Discussed the situation with the patient and her family friend who accompanied her, they verbalized understanding and agreed. Hopefully no surgical repair of the hernia is needed if she does well on PPI.   Crestwood Cellar, MD [facility] Gastroenterology

## 2019-02-21 ENCOUNTER — Ambulatory Visit: Payer: Medicare Other | Admitting: Podiatry

## 2019-02-21 ENCOUNTER — Telehealth: Payer: Self-pay | Admitting: Family Medicine

## 2019-02-21 ENCOUNTER — Encounter: Payer: Self-pay | Admitting: Family Medicine

## 2019-02-21 ENCOUNTER — Other Ambulatory Visit: Payer: Self-pay

## 2019-02-21 ENCOUNTER — Ambulatory Visit (INDEPENDENT_AMBULATORY_CARE_PROVIDER_SITE_OTHER): Payer: Medicare Other | Admitting: Family Medicine

## 2019-02-21 VITALS — BP 138/70 | HR 86 | Temp 97.6°F | Resp 17 | Ht <= 58 in | Wt 110.8 lb

## 2019-02-21 DIAGNOSIS — Z0001 Encounter for general adult medical examination with abnormal findings: Secondary | ICD-10-CM

## 2019-02-21 DIAGNOSIS — Z0000 Encounter for general adult medical examination without abnormal findings: Secondary | ICD-10-CM

## 2019-02-21 DIAGNOSIS — Z23 Encounter for immunization: Secondary | ICD-10-CM | POA: Diagnosis not present

## 2019-02-21 DIAGNOSIS — R634 Abnormal weight loss: Secondary | ICD-10-CM | POA: Diagnosis not present

## 2019-02-21 NOTE — Progress Notes (Signed)
QUICK REFERENCE INFORMATION: The ABCs of Providing the Annual Wellness Visit  CMS.gov Medicare Learning Network  [employer] Annual Wellness Visit  Subjective:   [name] is a 85 y.o. Female who presents for an Annual Wellness Visit.  Patient Active Problem List   Diagnosis Date Noted  . Degenerative disc disease, lumbar 01/01/2018  . Hiatal hernia 11/04/2016  . Acute blood loss anemia   . Gastroesophageal reflux disease without esophagitis 07/31/2014  . Gastric ulcer without hemorrhage or perforation 07/22/2013  . History of syphilis 02/27/2012  . Dementia ([place])   . Spondylosis   . Foot deformity   . Migraine   . CAD (coronary artery disease)   . HSV-2 (herpes simplex virus 2) infection   . Abnormal finding on Pap smear, ASCUS   . Osteoporosis   . CONSTIPATION 12/12/2010  . ANEMIA, IRON DEFICIENCY 06/17/2008  . BARRETTS ESOPHAGUS 06/17/2008  . GLAUCOMA 06/16/2008  . Essential hypertension 06/16/2008  . DIVERTICULOSIS, COLON 06/16/2008  . ARTHRITIS 06/16/2008  . SLEEP APNEA 06/16/2008    Past Medical History:  Diagnosis Date  . Abnormal finding on Pap smear, ASCUS   . Acute renal insufficiency   . Amputation finger    index  . Barrett's esophagus   . CAD (coronary artery disease)   . Dementia ([place])   . Diverticulosis   . Elevated BP   . Foot deformity   . GERD (gastroesophageal reflux disease)   . Glaucoma   . H/O: GI bleed   . HSV-2 (herpes simplex virus 2) infection   . Hyperglycemia   . Hypertension   . Migraine   . Osteoporosis    worsening  . Spondylosis      Past Surgical History:  Procedure Laterality Date  . AMPUTATION FINGER / THUMB     secondary to osteomyelitis  . CATARACT EXTRACTION, BILATERAL Bilateral 12/25/2016  . COLONOSCOPY    . ESOPHAGOGASTRODUODENOSCOPY N/A 10/22/2016   Procedure: ESOPHAGOGASTRODUODENOSCOPY (EGD);  Surgeon: Doran Stabler, MD;  Location: [facility] ENDOSCOPY;  Service: Endoscopy;  Laterality: N/A;  . FOOT SURGERY     right  . PARTIAL HYSTERECTOMY    . UPPER GASTROINTESTINAL ENDOSCOPY       Outpatient Medications Prior to Visit  Medication Sig Dispense Refill  . Calcium Carbonate-Vitamin D (CALCIUM-VITAMIN D) 500-200 MG-UNIT tablet Take 3 tablets by mouth every morning.    Water engineer Bandages & Supports (LUMBAR BACK BRACE/SUPPORT PAD) MISC Dispense one back brace to be worn while doing activities as needed daily 1 each 1  . estradiol (ESTRACE) 0.1 MG/GM vaginal cream INSERT 1 APPLICATORFUL VAGINALLY 1 TIME A WEEK 42.5 g 0  . ferrous sulfate (SM IRON SLOW RELEASE) 160 (50 Fe) MG TBCR SR tablet Take 1 tablet (160 mg total) by mouth daily. 30 each 6  . gabapentin (NEURONTIN) 800 MG tablet Take 1 tablet (800 mg total) by mouth at bedtime. 90 tablet 1  . hydrochlorothiazide (MICROZIDE) 12.5 MG capsule TAKE 1 CAPSULE BY MOUTH EVERY TUESDAY AND SATURDAY 90 capsule 3  . hydrocortisone 2.5 % ointment APPLY EXTERNALLY TO THE AFFECTED AREA TWICE DAILY 28.35 g 0  . hydroxypropyl methylcellulose (ISOPTO TEARS) 2.5 % ophthalmic solution Place 1 drop into both eyes 2 (two) times daily as needed for dry eyes.     Marland Kitchen lactose free nutrition (BOOST) LIQD Take 237 mLs by mouth 2 (two) times daily between meals. (Patient taking differently: Take 1 Container by mouth 2 (two) times daily as needed (nutritional). ) 60 Can 11  .  lidocaine (LIDODERM) 5 % Place 1 patch onto the skin daily. Remove & Discard patch within 12 hours or as directed by MD.  Apply to the right side of your neck where it hurts 30 patch 0  . metoprolol succinate (TOPROL-XL) 50 MG 24 hr tablet take 1 tablet by mouth once daily WITH OR IMMEDIATELY FOLLOWING A MEAL 90 tablet 3  . nystatin cream (MYCOSTATIN) APP EXT AA BID FOR 10 DAYS PRN  1  . ofloxacin (OCUFLOX) 0.3 % ophthalmic solution 10 drops in right ear once daily for 5 days. 5 mL 0  . pantoprazole (PROTONIX) 40 MG tablet Take 1 tablet (40 mg total) by mouth 2 (two) times daily. 180 tablet 1  . promethazine  (PHENERGAN) 12.5 MG tablet take 1 tablet by mouth every 6 hours if needed for nausea and vomiting 30 tablet 0  . sucralfate (CARAFATE) 1 g tablet Take 1 tablet (1 g total) by mouth every 6 (six) hours as needed. Slowly dissolve tablet in 1 Tablespoon of distilled water for 15 minutes before taking (do not crush) 360 tablet 0  . traMADol-acetaminophen (ULTRACET) 37.5-325 MG tablet Take 1 tablet by mouth every 6 (six) hours.  0  . triamcinolone cream (KENALOG) 0.1 % Apply topically 2 (two) times daily. 30 g 1  . verapamil (CALAN-SR) 240 MG CR tablet Take 1 tablet (240 mg total) by mouth daily. 90 tablet 3  . vitamin C (ASCORBIC ACID) 500 MG tablet Take 500 mg by mouth daily.    Marland Kitchen ZINC OXIDE, TOPICAL, 10 % CREA 1 application to affected area to prevent moisture and further irritation of skin. 50 g 0   No facility-administered medications prior to visit.     No Known Allergies   Family History  Problem Relation Age of Onset  . Breast cancer Mother   . Heart attack Mother   . Heart disease Father   . Alcohol abuse Son   . Colon cancer Neg Hx   . Stomach cancer Neg Hx   . Rectal cancer Neg Hx      Social History   Socioeconomic History  . Marital status: Single    Spouse name: Not on file  . Number of children: 1  . Years of education: Not on file  . Highest education level: 12th grade  Occupational History    Employer: RETIRED  Social Needs  . Financial resource strain: Not on file  . Food insecurity:    Worry: Not on file    Inability: Not on file  . Transportation needs:    Medical: Not on file    Non-medical: Not on file  Tobacco Use  . Smoking status: Never Smoker  . Smokeless tobacco: Never Used  Substance and Sexual Activity  . Alcohol use: No    Alcohol/week: 0.0 standard drinks  . Drug use: No  . Sexual activity: Never    Birth control/protection: Post-menopausal  Lifestyle  . Physical activity:    Days per week: Not on file    Minutes per session: Not on  file  . Stress: Not on file  Relationships  . Social connections:    Talks on phone: Not on file    Gets together: Not on file    Attends religious service: Not on file    Active member of club or organization: Not on file    Attends meetings of clubs or organizations: Not on file    Relationship status: Not on file  Other Topics Concern  .  Not on file  Social History Narrative   Marital status: divorced.      Children: 1 son; no grandchildren      Lives: with son      Employment: retired      Tobacco: none      Alcohol: none      Drugs: none       Exercise: none       ADLs:  Independent with ADLs.  Quit driving age 72.  Medical CNA cleans house; makes breakfast; patient pays bills.      Advanced Directives:   YES.  HCPOA:  Son/Isaac Bowman.  FULL CODE; no prolonged measures.      Recent Hospitalizations? No  Health Habits: Current exercise activities include: yard work Exercise: 3 times/week. Diet: in general, a "healthy" diet    Alcohol intake:   Health Risk Assessment: The patient has completed a Health Risk Assessment. This has been reveiwed with them and has been scanned into the [facility] system as an attached document.  Current Medical Providers and Suppliers: [facility] Patient Care Team: Forrest Moron, MD as PCP - General (Internal Medicine) Fay Records, MD (Cardiology) Druscilla Brownie, MD (Dermatology) [name], MD (Inactive) (Gastroenterology) Verdis Frederickson, MD (Inactive) (Obstetrics and Gynecology) Clent Jacks, MD (Ophthalmology) Roseanne Kaufman, MD (Orthopedic Surgery) Future Appointments  Date Time Provider [place]  05/26/2019  3:15 PM Marzetta Board, DPM [facility]     Age-appropriate Screening Schedule: The list below includes current immunization status and future screening recommendations based on patient's age. Orders for these recommended tests are listed in the plan section. The patient has been provided  with a written plan. Immunization History  Administered Date(s) Administered  . Influenza Split 10/12/2011  . Influenza Whole 09/20/2012  . Influenza, High Dose Seasonal PF 02/21/2019  . Influenza,inj,Quad PF,6+ Mos 10/07/2013, 09/03/2014, 08/26/2015, 10/11/2016  . Influenza-Unspecified 08/25/2017  . PPD Test 10/16/2018  . Pneumococcal Conjugate-13 07/21/2014  . Pneumococcal Polysaccharide-23 12/24/2001, 12/24/2006, 11/04/2016  . Tdap 07/15/2007, 04/28/2014  . Zoster 01/02/2018  . Zoster Recombinat (Shingrix) 01/02/2018     Depression Screen-PHQ2/9 completed today  Depression screen [facility] 2/9 02/21/2019 01/24/2019 01/01/2019 12/12/2018 09/17/2018  Decreased Interest 0 0 0 0 2  Down, Depressed, Hopeless 0 0 0 0 3  PHQ - 2 Score 0 0 0 0 5  Altered sleeping - - - - 3  Tired, decreased energy - - - - 2  Change in appetite - - - - 1  Feeling bad or failure about yourself  - - - - 0  Trouble concentrating - - - - 0  Moving slowly or fidgety/restless - - - - 0  Suicidal thoughts - - - - 0  PHQ-9 Score - - - - 11  Difficult doing work/chores - - - - Very difficult  Some recent data might be hidden       Depression Severity and Treatment Recommendations:  0-4= None  5-9= Mild / Treatment: Support, educate to call if worse; return in one month  10-14= Moderate / Treatment: Support, watchful waiting; Antidepressant or Psycotherapy  15-19= Moderately severe / Treatment: Antidepressant OR Psychotherapy  >= 20 = Major depression, severe / Antidepressant AND Psychotherapy  Functional Status Survey:   Is the patient deaf or have difficulty hearing?: No Does the patient have difficulty seeing, even when wearing glasses/contacts?: No Does the patient have difficulty concentrating, remembering, or making decisions?: No Does the patient have difficulty walking or climbing stairs?: Yes  Does the patient have difficulty dressing or bathing?: Yes Does the patient have difficulty doing errands  alone such as visiting a doctor's office or shopping?: Yes    Advanced Care Planning: 1. Patient has executed an Advance Directive: No 2. If no, patient was given the opportunity to execute an Advance Directive today? Yes 3. Are the patient's advanced directives in [place]? No 4. This patient has the ability to prepare an Advance Directive: Yes 5. Provider is willing to follow the patient's wishes: Yes  Cognitive Assessment: Does the patient have evidence of cognitive impairment? No The patient does not have any evidence of any cognitive problems and denies any  change in mood/affect, appearance, speech, memory or motor skills.  Identification of Risk Factors:  ROS  Review of Systems  Constitutional: Negative for activity change, appetite change, chills and fever.  HENT: Negative for congestion, nosebleeds, trouble swallowing and voice change.   Respiratory: Negative for cough, shortness of breath and wheezing.   Gastrointestinal: Negative for diarrhea, nausea and vomiting.  Genitourinary: Negative for difficulty urinating, dysuria, flank pain and hematuria.  Musculoskeletal: Negative for back pain, joint swelling and neck pain.  Neurological: Negative for dizziness, speech difficulty, light-headedness and numbness.  See HPI. All other review of systems negative.    Objective:   Vitals:   02/21/19 1115 02/21/19 1209  BP: (!) 173/89 138/70  Pulse: 86   Resp: 17   Temp: 97.6 F (36.4 C)   TempSrc: Oral   SpO2: 98%   Weight: 110 lb 12.8 oz (50.3 kg)   Height: 4\' 10"  (1.473 m)     Body mass index is 23.16 kg/m.  BP 138/70   Pulse 86   Temp 97.6 F (36.4 C) (Oral)   Resp 17   Ht 4\' 10"  (1.473 m)   Wt 110 lb 12.8 oz (50.3 kg)   SpO2 98%   BMI 23.16 kg/m   General Appearance:    Alert, cooperative, no distress, appears stated age  Head:    Normocephalic, without obvious abnormality, atraumatic  Eyes:    PERRL, conjunctiva/corneas clear, EOM's intact, fundi     benign, both eyes  Ears:    Normal TM's and external ear canals, both ears  Nose:   Nares normal, septum midline, mucosa normal, no drainage    or sinus tenderness  Throat:   Lips, mucosa, and tongue normal; teeth and gums normal  Lungs:     Clear to auscultation bilaterally, respirations unlabored  Chest Wall:    No tenderness or deformity   Heart:    Regular rate and rhythm, S1 and S2 normal, no murmur, rub   or gallop  Breast Exam:    No tenderness, masses, or nipple abnormality  Abdomen:     Soft, non-tender, bowel sounds active all four quadrants,    no masses, no organomegaly  Extremities:   Kyphosis and arthritis noted in multiple joints  Pulses:   2+ and symmetric all extremities  Skin:   Skin color, texture, turgor normal, no rashes or lesions  Lymph nodes:   Cervical, supraclavicular, and axillary nodes normal  Neurologic:   CNII-XII intact, normal strength, sensation and reflexes    throughout       Assessment/Plan:   Patient Self-Management and Personalized Health Advice The patient has been provided with information about:  use calcium 1 gram daily with Vit D  During the course of the visit the patient was educated and counseled about appropriate screening and preventive services including:  return annually or prn     Body mass index is 23.16 kg/m. Discussed the patient's BMI with her. The BMI BMI is in the acceptable range  [name] was seen today for breast and vaginal exam.  Diagnoses and all orders for this visit:  Encounter for Medicare annual wellness exam-  Discussed age appropriate screenings  Loss of weight  Need for prophylactic vaccination and inoculation against influenza  Other orders -     Flu vaccine HIGH DOSE PF (Fluzone High dose)      No follow-ups on file.  Future Appointments  Date Time Provider [place]  05/26/2019  3:15 PM Marzetta Board, DPM [facility]    Patient Instructions    Wt Readings from  Last 3 Encounters:  02/21/19 110 lb 12.8 oz (50.3 kg)  02/14/19 105 lb (47.6 kg)  01/24/19 115 lb 3.2 oz (52.3 kg)     If you have lab work done today you will be contacted with your lab results within the next 2 weeks.  If you have not heard from [place] then please contact us. The fastest way to get your results is to register for My Chart.   IF you received an x-ray today, you will receive an invoice from [facility] Radiology. Please contact [facility] Radiology at [phone] with questions or concerns regarding your invoice.   IF you received labwork today, you will receive an invoice from [facility]. Please contact [facility] at [phone] with questions or concerns regarding your invoice.   Our billing staff will not be able to assist you with questions regarding bills from these companies.  You will be contacted with the lab results as soon as they are available. The fastest way to get your results is to activate your My Chart account. Instructions are located on the last page of this paperwork. If you have not heard from [place] regarding the results in 2 weeks, please contact this office.       An after visit summary with all of these plans was given to the patient.

## 2019-02-21 NOTE — Telephone Encounter (Signed)
Copied from [place] [id number]. Topic: Appointment Scheduling - Scheduling Inquiry for Clinic >> Feb 21, 2019  2:52 PM Vernona Rieger wrote: Reason for CRM: patient was in today and would like to know if she needs to come back for a follow up or anything later on? It does not specify on AVS

## 2019-02-21 NOTE — Patient Instructions (Addendum)
  Wt Readings from Last 3 Encounters:  02/21/19 110 lb 12.8 oz (50.3 kg)  02/14/19 105 lb (47.6 kg)  01/24/19 115 lb 3.2 oz (52.3 kg)     If you have lab work done today you will be contacted with your lab results within the next 2 weeks.  If you have not heard from [place] then please contact us. The fastest way to get your results is to register for My Chart.   IF you received an x-ray today, you will receive an invoice from [facility]. Please contact [facility] Radiology at [phone] with questions or concerns regarding your invoice.   IF you received labwork today, you will receive an invoice from [place]. Please contact [facility] at [phone] with questions or concerns regarding your invoice.   Our billing staff will not be able to assist you with questions regarding bills from these companies.  You will be contacted with the lab results as soon as they are available. The fastest way to get your results is to activate your My Chart account. Instructions are located on the last page of this paperwork. If you have not heard from [place] regarding the results in 2 weeks, please contact this office.

## 2019-02-25 ENCOUNTER — Ambulatory Visit: Payer: Self-pay | Admitting: *Deleted

## 2019-02-25 NOTE — Telephone Encounter (Signed)
Please advise I dont see anything about a follow up in notes.

## 2019-02-25 NOTE — Telephone Encounter (Addendum)
Message from Carolyn Stare sent at 02/25/2019 2:45 PM EST    Pt said she is having some arthritis pain and is asking what else she can do    Returned call to patient regarding arthritis pain. she took a muscle relaxer Methocarbamol 500 mg earlier. She wanted advice on what she should be taking. She has a pain pill ordered. Advised that she try that. Pt voiced understanding and will try that now. She has done a lot of sweeping today and that's why so much discomfort.  Will call back for any other concerns. Routing to flow at PCP

## 2019-02-25 NOTE — Telephone Encounter (Signed)
Please see note below. 

## 2019-02-26 ENCOUNTER — Ambulatory Visit (INDEPENDENT_AMBULATORY_CARE_PROVIDER_SITE_OTHER): Payer: Medicare Other | Admitting: Podiatry

## 2019-02-26 DIAGNOSIS — M79676 Pain in unspecified toe(s): Secondary | ICD-10-CM | POA: Diagnosis not present

## 2019-02-26 DIAGNOSIS — L84 Corns and callosities: Secondary | ICD-10-CM | POA: Diagnosis not present

## 2019-02-26 DIAGNOSIS — B351 Tinea unguium: Secondary | ICD-10-CM

## 2019-02-26 NOTE — Patient Instructions (Signed)
Corns and Calluses Corns are small areas of thickened skin that occur on the top, sides, or tip of a toe. They contain a cone-shaped core with a point that can press on a nerve below. This causes pain.  Calluses are areas of thickened skin that can occur anywhere on the body, including the hands, fingers, palms, soles of the feet, and heels. Calluses are usually larger than corns. What are the causes? Corns and calluses are caused by rubbing (friction) or pressure, such as from shoes that are too tight or do not fit properly. What increases the risk? Corns are more likely to develop in people who have misshapen toes (toe deformities), such as hammer toes. Calluses can occur with friction to any area of the skin. They are more likely to develop in people who:  Work with their hands.  Wear shoes that fit poorly, are too tight, or are high-heeled.  Have toe deformities. What are the signs or symptoms? Symptoms of a corn or callus include:  A hard growth on the skin.  Pain or tenderness under the skin.  Redness and swelling.  Increased discomfort while wearing tight-fitting shoes, if your feet are affected. If a corn or callus becomes infected, symptoms may include:  Redness and swelling that gets worse.  Pain.  Fluid, blood, or pus draining from the corn or callus. How is this diagnosed? Corns and calluses may be diagnosed based on your symptoms, your medical history, and a physical exam. How is this treated? Treatment for corns and calluses may include:  Removing the cause of the friction or pressure. This may involve: ? Changing your shoes. ? Wearing shoe inserts (orthotics) or other protective layers in your shoes, such as a corn pad. ? Wearing gloves.  Applying medicine to the skin (topical medicine) to help soften skin in the hardened, thickened areas.  Removing layers of dead skin with a file to reduce the size of the corn or callus.  Removing the corn or callus with a  scalpel or laser.  Taking antibiotic medicines, if your corn or callus is infected.  Having surgery, if a toe deformity is the cause. Follow these instructions at home:   Take over-the-counter and prescription medicines only as told by your health care provider.  If you were prescribed an antibiotic, take it as told by your health care provider. Do not stop taking it even if your condition starts to improve.  Wear shoes that fit well. Avoid wearing high-heeled shoes and shoes that are too tight or too loose.  Wear any padding, protective layers, gloves, or orthotics as told by your health care provider.  Soak your hands or feet and then use a file or pumice stone to soften your corn or callus. Do this as told by your health care provider.  Check your corn or callus every day for symptoms of infection. Contact a health care provider if you:  Notice that your symptoms do not improve with treatment.  Have redness or swelling that gets worse.  Notice that your corn or callus becomes painful.  Have fluid, blood, or pus coming from your corn or callus.  Have new symptoms. Summary  Corns are small areas of thickened skin that occur on the top, sides, or tip of a toe.  Calluses are areas of thickened skin that can occur anywhere on the body, including the hands, fingers, palms, and soles of the feet. Calluses are usually larger than corns.  Corns and calluses are caused by   rubbing (friction) or pressure, such as from shoes that are too tight or do not fit properly.  Treatment may include wearing any padding, protective layers, gloves, or orthotics as told by your health care provider. This information is not intended to replace advice given to you by your health care provider. Make sure you discuss any questions you have with your health care provider. Document Released: 09/16/2004 Document Revised: 10/24/2017 Document Reviewed: 10/24/2017 Elsevier Interactive Patient Education   2019 [employer]  Onychomycosis/Fungal Toenails  WHAT IS IT? An infection that lies within the keratin of your nail plate that is caused by a fungus.  WHY ME? Fungal infections affect all ages, sexes, races, and creeds.  There may be many factors that predispose you to a fungal infection such as age, coexisting medical conditions such as diabetes, or an autoimmune disease; stress, medications, fatigue, genetics, etc.  Bottom line: fungus thrives in a warm, moist environment and your shoes offer such a location.  IS IT CONTAGIOUS? Theoretically, yes.  You do not want to share shoes, nail clippers or files with someone who has fungal toenails.  Walking around barefoot in the same room or sleeping in the same bed is unlikely to transfer the organism.  It is important to realize, however, that fungus can spread easily from one nail to the next on the same foot.  HOW DO WE TREAT THIS?  There are several ways to treat this condition.  Treatment may depend on many factors such as age, medications, pregnancy, liver and kidney conditions, etc.  It is best to ask your doctor which options are available to you.  1. No treatment.   Unlike many other medical concerns, you can live with this condition.  However for many people this can be a painful condition and may lead to ingrown toenails or a bacterial infection.  It is recommended that you keep the nails cut short to help reduce the amount of fungal nail. 2. Topical treatment.  These range from herbal remedies to prescription strength nail lacquers.  About 40-50% effective, topicals require twice daily application for approximately 9 to 12 months or until an entirely new nail has grown out.  The most effective topicals are medical grade medications available through physicians offices. 3. Oral antifungal medications.  With an 80-90% cure rate, the most common oral medication requires 3 to 4 months of therapy and stays in your system for a year as the new nail  grows out.  Oral antifungal medications do require blood work to make sure it is a safe drug for you.  A liver function panel will be performed prior to starting the medication and after the first month of treatment.  It is important to have the blood work performed to avoid any harmful side effects.  In general, this medication safe but blood work is required. 4. Laser Therapy.  This treatment is performed by applying a specialized laser to the affected nail plate.  This therapy is noninvasive, fast, and non-painful.  It is not covered by insurance and is therefore, out of pocket.  The results have been very good with a 80-95% cure rate.  [facility] is the only practice in the area to offer this therapy. 5. Permanent Nail Avulsion.  Removing the entire nail so that a new nail will not grow back. 

## 2019-02-27 DIAGNOSIS — Z961 Presence of intraocular lens: Secondary | ICD-10-CM | POA: Diagnosis not present

## 2019-02-27 DIAGNOSIS — H04123 Dry eye syndrome of bilateral lacrimal glands: Secondary | ICD-10-CM | POA: Diagnosis not present

## 2019-02-27 DIAGNOSIS — H401121 Primary open-angle glaucoma, left eye, mild stage: Secondary | ICD-10-CM | POA: Diagnosis not present

## 2019-02-27 DIAGNOSIS — H401122 Primary open-angle glaucoma, left eye, moderate stage: Secondary | ICD-10-CM | POA: Diagnosis not present

## 2019-03-05 ENCOUNTER — Ambulatory Visit: Payer: Medicare Other | Admitting: Family Medicine

## 2019-03-05 NOTE — Telephone Encounter (Signed)
Pt seen on 02/21/19 and back brace issue discussed and pt will not get one as it will not help her back issues.  Pt agreeable. Dgaddy, CMA

## 2019-03-06 ENCOUNTER — Encounter: Payer: Self-pay | Admitting: Podiatry

## 2019-03-06 NOTE — Progress Notes (Signed)
Subjective: [name] presents today with painful, thick toenails 1-5 b/l that she cannot cut and which interfere with daily activities.  Pain is aggravated when wearing enclosed shoe gear.  [name] also has painful calluses b/l feet which are aggravated when weightbearing.  Forrest Moron, MD is her PCP.   Current Outpatient Medications:  .  Calcium Carbonate-Vitamin D (CALCIUM-VITAMIN D) 500-200 MG-UNIT tablet, Take 3 tablets by mouth every morning., Disp: , Rfl:  .  Elastic Bandages & Supports (LUMBAR BACK BRACE/SUPPORT PAD) MISC, Dispense one back brace to be worn while doing activities as needed daily, Disp: 1 each, Rfl: 1 .  estradiol (ESTRACE) 0.1 MG/GM vaginal cream, INSERT 1 APPLICATORFUL VAGINALLY 1 TIME A WEEK, Disp: 42.5 g, Rfl: 0 .  ferrous sulfate (SM IRON SLOW RELEASE) 160 (50 Fe) MG TBCR SR tablet, Take 1 tablet (160 mg total) by mouth daily., Disp: 30 each, Rfl: 6 .  gabapentin (NEURONTIN) 800 MG tablet, Take 1 tablet (800 mg total) by mouth at bedtime., Disp: 90 tablet, Rfl: 1 .  hydrochlorothiazide (MICROZIDE) 12.5 MG capsule, TAKE 1 CAPSULE BY MOUTH EVERY TUESDAY AND SATURDAY, Disp: 90 capsule, Rfl: 3 .  hydrocortisone 2.5 % ointment, APPLY EXTERNALLY TO THE AFFECTED AREA TWICE DAILY, Disp: 28.35 g, Rfl: 0 .  hydroxypropyl methylcellulose (ISOPTO TEARS) 2.5 % ophthalmic solution, Place 1 drop into both eyes 2 (two) times daily as needed for dry eyes. , Disp: , Rfl:  .  lactose free nutrition (BOOST) LIQD, Take 237 mLs by mouth 2 (two) times daily between meals. (Patient taking differently: Take 1 Container by mouth 2 (two) times daily as needed (nutritional). ), Disp: 60 Can, Rfl: 11 .  lidocaine (LIDODERM) 5 %, Place 1 patch onto the skin daily. Remove & Discard patch within 12 hours or as directed by MD.  Apply to the right side of your neck where it hurts, Disp: 30 patch, Rfl: 0 .  metoprolol succinate (TOPROL-XL) 50 MG 24 hr tablet, take 1 tablet by mouth once daily  WITH OR IMMEDIATELY FOLLOWING A MEAL, Disp: 90 tablet, Rfl: 3 .  nystatin cream (MYCOSTATIN), APP EXT AA BID FOR 10 DAYS PRN, Disp: , Rfl: 1 .  ofloxacin (OCUFLOX) 0.3 % ophthalmic solution, 10 drops in right ear once daily for 5 days., Disp: 5 mL, Rfl: 0 .  pantoprazole (PROTONIX) 40 MG tablet, Take 1 tablet (40 mg total) by mouth 2 (two) times daily., Disp: 180 tablet, Rfl: 1 .  promethazine (PHENERGAN) 12.5 MG tablet, take 1 tablet by mouth every 6 hours if needed for nausea and vomiting, Disp: 30 tablet, Rfl: 0 .  sucralfate (CARAFATE) 1 g tablet, Take 1 tablet (1 g total) by mouth every 6 (six) hours as needed. Slowly dissolve tablet in 1 Tablespoon of distilled water for 15 minutes before taking (do not crush), Disp: 360 tablet, Rfl: 0 .  traMADol-acetaminophen (ULTRACET) 37.5-325 MG tablet, Take 1 tablet by mouth every 6 (six) hours., Disp: , Rfl: 0 .  triamcinolone cream (KENALOG) 0.1 %, Apply topically 2 (two) times daily., Disp: 30 g, Rfl: 1 .  verapamil (CALAN-SR) 240 MG CR tablet, Take 1 tablet (240 mg total) by mouth daily., Disp: 90 tablet, Rfl: 3 .  vitamin C (ASCORBIC ACID) 500 MG tablet, Take 500 mg by mouth daily., Disp: , Rfl:  .  ZINC OXIDE, TOPICAL, 10 % CREA, 1 application to affected area to prevent moisture and further irritation of skin., Disp: 50 g, Rfl: 0  No  Known Allergies  Objective:  Vascular Examination: Capillary refill time immediate x 10 digits.  Dorsalis pedis and Posterior tibial pulses palpable b/l.  Digital hair absent x 10 digits  Skin temperature gradient WNL b/l  Dermatological Examination: Skin with normal turgor, texture and tone b/l  Toenails 1-5 b/l discolored, thick, dystrophic with subungual debris and pain with palpation to nailbeds due to thickness of nails.  Hyperkeratotic lesion subcentral heel b/l, submet base 5th b/l  Musculoskeletal: Muscle strength 5/5 to all LE muscle groups  HAV with bunion b/l  Overlapping hammertoe  deformity b/l 2nd digits  No pain, crepitus or joint limitation noted with ROM.   Neurological: Sensation intact with 10 gram monofilament.  Vibratory sensation diminished b/l.  Assessment: Painful onychomycosis toenails 1-5 b/l  Calluses b/l heels, submet base 5th b/l  Plan: 1. Toenails 1-5 b/l were debrided in length and girth without iatrogenic bleeding. Calluses pared plantar heels b/l and submet base 5th b/l with sterile scalpel blade without incident.  2. Patient to continue soft, supportive shoe gear. 3. Patient to report any pedal injuries to medical professional immediately. 4. Follow up 3 months.  5. Patient/POA to call should there be a concern in the interim.

## 2019-03-14 ENCOUNTER — Other Ambulatory Visit: Payer: Self-pay | Admitting: Family Medicine

## 2019-03-14 NOTE — Telephone Encounter (Signed)
Requested Prescriptions  Pending Prescriptions Disp Refills  . ESTRACE VAGINAL 0.1 MG/GM vaginal cream [Pharmacy Med Name: ESTRACE VAGINAL CREAM 42.5GM] 42.5 g 0    Sig: INSERT 1 APPLICATORFUL VAGINALLY 1 TIME A WEEK     OB/GYN:  Estrogens Failed - 03/14/2019  8:49 AM      Failed - Mammogram is up-to-date per Health Maintenance      Passed - Last BP in normal range    BP Readings from Last 1 Encounters:  02/21/19 138/70         Passed - Valid encounter within last 12 months    Recent Outpatient Visits          3 weeks ago Encounter for Medicare annual wellness exam   Primary [facility] at [facility], Arlie Solomons, MD   1 month ago Skin irritation   [facility] at [facility], Ranell Patrick, MD   2 months ago Degenerative disc disease, lumbar   Primary Care at [facility], Arlie Solomons, MD   3 months ago Dizziness   Primary Care at [place], MD   4 months ago Screening-pulmonary TB   Primary Care at [facility], Reather Laurence, PA-C      Future Appointments            In 3 weeks Forrest Moron, MD [facility] at [place]

## 2019-03-17 ENCOUNTER — Telehealth: Payer: Self-pay

## 2019-03-17 NOTE — Telephone Encounter (Signed)
Spoke with pt via phone on how she is feeling and pt replies, "I am doing well and I am practicing staying away from crowds.  Pt is in good spirits.  I advised with the covid-19 outbreak [facility] is changing how we see pt's and trying to limit exposure of the elderly to the virus.  We have implemented telemedicine visit and her appt on 04/08/2019 at 2:00 pm will now be a telemedicine.  I explained the visit is just like a regular office visit and provider will have more time to talk with you about your health.  Pt is agreeable, advised appt time may change but if so we will contact her with the new time.  Pt agreeable.  Will send to scheduler to change to telemedicine visit. Dgaddy, CMA

## 2019-03-18 ENCOUNTER — Telehealth: Payer: Self-pay | Admitting: Gastroenterology

## 2019-03-18 MED ORDER — PANTOPRAZOLE SODIUM 40 MG PO TBEC: 40.0000 mg | DELAYED_RELEASE_TABLET | Freq: Two times a day (BID) | ORAL | 1 refills | Status: DC

## 2019-03-18 NOTE — Telephone Encounter (Signed)
PT WOULD LIKE A REFILL FOR pantoprazole (PROTONIX) 40 MG

## 2019-03-18 NOTE — Telephone Encounter (Signed)
Refill sent.

## 2019-03-19 ENCOUNTER — Ambulatory Visit: Payer: Medicare Other | Admitting: Family Medicine

## 2019-04-07 ENCOUNTER — Other Ambulatory Visit: Payer: Self-pay | Admitting: Family Medicine

## 2019-04-07 MED ORDER — GABAPENTIN 800 MG PO TABS: 800.0000 mg | ORAL_TABLET | Freq: Every day | ORAL | 1 refills | Status: DC

## 2019-04-08 ENCOUNTER — Telehealth (INDEPENDENT_AMBULATORY_CARE_PROVIDER_SITE_OTHER): Payer: Medicare Other | Admitting: Family Medicine

## 2019-04-08 ENCOUNTER — Other Ambulatory Visit: Payer: Self-pay

## 2019-04-08 ENCOUNTER — Encounter: Payer: Self-pay | Admitting: Family Medicine

## 2019-04-08 VITALS — BP 138/63

## 2019-04-08 DIAGNOSIS — I10 Essential (primary) hypertension: Secondary | ICD-10-CM | POA: Diagnosis not present

## 2019-04-08 NOTE — Progress Notes (Signed)
Telemedicine Encounter- SOAP NOTE Established Patient  This telephone encounter was conducted with the patient's (or proxy's) verbal consent via audio telecommunications: yes/no: Yes Patient was instructed to have this encounter in a suitably private space; and to only have persons present to whom they give permission to participate. In addition, patient identity was confirmed by use of name plus two identifiers (DOB and address).  I discussed the limitations, risks, security and privacy concerns of performing an evaluation and management service by telephone and the availability of in person appointments. I also discussed with the patient that there may be a patient responsible charge related to this service. The patient expressed understanding and agreed to proceed.  I spent a total of TIME; 0 MIN TO 60 MIN: 15 minutes talking with the patient or their proxy.  CC: hypertension  Subjective   [name] is a 85 y.o. established patient. Telephone visit today for  HPI  Hypertension   She has an aide at the house She did some yard work today She states that she has been getting her bp checked and it is 130/60s She states that she is able to do yard work WITHOUT dizziness, chest pains, shortness of breath She paces herself BP Readings from Last 3 Encounters:  04/08/19 138/63  02/21/19 138/70  02/14/19 118/72     Patient Active Problem List   Diagnosis Date Noted  . Degenerative disc disease, lumbar 01/01/2018  . Hiatal hernia 11/04/2016  . Acute blood loss anemia   . Gastroesophageal reflux disease without esophagitis 07/31/2014  . Gastric ulcer without hemorrhage or perforation 07/22/2013  . History of syphilis 02/27/2012  . Dementia ([place])   . Spondylosis   . Foot deformity   . Migraine   . CAD (coronary artery disease)   . HSV-2 (herpes simplex virus 2) infection   . Abnormal finding on Pap smear, ASCUS   . Osteoporosis   . CONSTIPATION 12/12/2010  . ANEMIA, IRON  DEFICIENCY 06/17/2008  . BARRETTS ESOPHAGUS 06/17/2008  . GLAUCOMA 06/16/2008  . Essential hypertension 06/16/2008  . DIVERTICULOSIS, COLON 06/16/2008  . ARTHRITIS 06/16/2008  . SLEEP APNEA 06/16/2008    Past Medical History:  Diagnosis Date  . Abnormal finding on Pap smear, ASCUS   . Acute renal insufficiency   . Amputation finger    index  . Barrett's esophagus   . CAD (coronary artery disease)   . Dementia ([place])   . Diverticulosis   . Elevated BP   . Foot deformity   . GERD (gastroesophageal reflux disease)   . Glaucoma   . H/O: GI bleed   . HSV-2 (herpes simplex virus 2) infection   . Hyperglycemia   . Hypertension   . Migraine   . Osteoporosis    worsening  . Spondylosis     Current Outpatient Medications  Medication Sig Dispense Refill  . Calcium Carbonate-Vitamin D (CALCIUM-VITAMIN D) 500-200 MG-UNIT tablet Take 3 tablets by mouth every morning.    Water engineer Bandages & Supports (LUMBAR BACK BRACE/SUPPORT PAD) MISC Dispense one back brace to be worn while doing activities as needed daily 1 each 1  . ESTRACE VAGINAL 0.1 MG/GM vaginal cream INSERT 1 APPLICATORFUL VAGINALLY 1 TIME A WEEK 42.5 g 0  . ferrous sulfate (SM IRON SLOW RELEASE) 160 (50 Fe) MG TBCR SR tablet Take 1 tablet (160 mg total) by mouth daily. 30 each 6  . gabapentin (NEURONTIN) 800 MG tablet Take 1 tablet (800 mg total) by mouth at bedtime.  90 tablet 1  . hydrochlorothiazide (MICROZIDE) 12.5 MG capsule TAKE 1 CAPSULE BY MOUTH EVERY TUESDAY AND SATURDAY 90 capsule 3  . hydrocortisone 2.5 % ointment APPLY EXTERNALLY TO THE AFFECTED AREA TWICE DAILY 28.35 g 0  . hydroxypropyl methylcellulose (ISOPTO TEARS) 2.5 % ophthalmic solution Place 1 drop into both eyes 2 (two) times daily as needed for dry eyes.     Marland Kitchen lactose free nutrition (BOOST) LIQD Take 237 mLs by mouth 2 (two) times daily between meals. (Patient taking differently: Take 1 Container by mouth 2 (two) times daily as needed (nutritional). ) 60  Can 11  . lidocaine (LIDODERM) 5 % Place 1 patch onto the skin daily. Remove & Discard patch within 12 hours or as directed by MD.  Apply to the right side of your neck where it hurts 30 patch 0  . metoprolol succinate (TOPROL-XL) 50 MG 24 hr tablet take 1 tablet by mouth once daily WITH OR IMMEDIATELY FOLLOWING A MEAL 90 tablet 3  . nystatin cream (MYCOSTATIN) APP EXT AA BID FOR 10 DAYS PRN  1  . ofloxacin (OCUFLOX) 0.3 % ophthalmic solution 10 drops in right ear once daily for 5 days. 5 mL 0  . pantoprazole (PROTONIX) 40 MG tablet Take 1 tablet (40 mg total) by mouth 2 (two) times daily. 180 tablet 1  . promethazine (PHENERGAN) 12.5 MG tablet take 1 tablet by mouth every 6 hours if needed for nausea and vomiting 30 tablet 0  . sucralfate (CARAFATE) 1 g tablet Take 1 tablet (1 g total) by mouth every 6 (six) hours as needed. Slowly dissolve tablet in 1 Tablespoon of distilled water for 15 minutes before taking (do not crush) 360 tablet 0  . traMADol-acetaminophen (ULTRACET) 37.5-325 MG tablet Take 1 tablet by mouth every 6 (six) hours.  0  . triamcinolone cream (KENALOG) 0.1 % Apply topically 2 (two) times daily. 30 g 1  . verapamil (CALAN-SR) 240 MG CR tablet Take 1 tablet (240 mg total) by mouth daily. 90 tablet 3  . vitamin C (ASCORBIC ACID) 500 MG tablet Take 500 mg by mouth daily.    Marland Kitchen ZINC OXIDE, TOPICAL, 10 % CREA 1 application to affected area to prevent moisture and further irritation of skin. 50 g 0   No current facility-administered medications for this visit.     No Known Allergies  Social History   Socioeconomic History  . Marital status: Single    Spouse name: Not on file  . Number of children: 1  . Years of education: Not on file  . Highest education level: 12th grade  Occupational History    Employer: RETIRED  Social Needs  . Financial resource strain: Not on file  . Food insecurity:    Worry: Not on file    Inability: Not on file  . Transportation needs:     Medical: Not on file    Non-medical: Not on file  Tobacco Use  . Smoking status: Never Smoker  . Smokeless tobacco: Never Used  Substance and Sexual Activity  . Alcohol use: No    Alcohol/week: 0.0 standard drinks  . Drug use: No  . Sexual activity: Never    Birth control/protection: Post-menopausal  Lifestyle  . Physical activity:    Days per week: Not on file    Minutes per session: Not on file  . Stress: Not on file  Relationships  . Social connections:    Talks on phone: Not on file    Gets together:  Not on file    Attends religious service: Not on file    Active member of club or organization: Not on file    Attends meetings of clubs or organizations: Not on file    Relationship status: Not on file  . Intimate partner violence:    Fear of current or ex partner: Not on file    Emotionally abused: Not on file    Physically abused: Not on file    Forced sexual activity: Not on file  Other Topics Concern  . Not on file  Social History Narrative   Marital status: divorced.      Children: 1 son; no grandchildren      Lives: with son      Employment: retired      Tobacco: none      Alcohol: none      Drugs: none       Exercise: none       ADLs:  Independent with ADLs.  Quit driving age 74.  Medical CNA cleans house; makes breakfast; patient pays bills.      Advanced Directives:   YES.  HCPOA:  Son/Isaac Bowman.  FULL CODE; no prolonged measures.    ROS Review of Systems  Constitutional: Negative for activity change, appetite change, chills and fever.  HENT: Negative for congestion, nosebleeds, trouble swallowing and voice change.   Respiratory: Negative for cough, shortness of breath and wheezing.   Gastrointestinal: Negative for diarrhea, nausea and vomiting.  Genitourinary: Negative for difficulty urinating, dysuria, flank pain and hematuria.  Musculoskeletal: Negative for back pain, joint swelling and neck pain.  Neurological: Negative for dizziness, speech  difficulty, light-headedness and numbness.  See HPI. All other review of systems negative.   Objective   Vitals as reported by the patient: Today's Vitals   04/08/19 1430  BP: 138/63    Diagnoses and all orders for this visit:  Essential hypertension   Discussed that she should continue all her current medications She should do not go out an about  She should continue light exercise Continue bp monitoring twice a week    I discussed the assessment and treatment plan with the patient. The patient was provided an opportunity to ask questions and all were answered. The patient agreed with the plan and demonstrated an understanding of the instructions.   The patient was advised to call back or seek an in-person evaluation if the symptoms worsen or if the condition fails to improve as anticipated.  I provided 15 minutes of non-face-to-face time during this encounter.  Forrest Moron, MD  Primary Care at [facility]

## 2019-04-08 NOTE — Progress Notes (Signed)
Pt lives in assisted living since the death of her son, her bp was taken 138/63. She is concerned about the diastolic number being low.Says she feels good, no dizziness, just sleeps a lot. Use to doing yard work but has to stay inside now due to virus which causing some anxiety, she is not depressed at all. Just misses her son. The only meds she needs refilled ore her bp meds and the cream she uses for her vaginal itching. No one was available to take her bp while on the phone with her.

## 2019-04-09 NOTE — Telephone Encounter (Signed)
Was changed to a Virtual OV for 04/08/2019

## 2019-04-20 ENCOUNTER — Telehealth: Payer: Self-pay | Admitting: Internal Medicine

## 2019-04-20 NOTE — Telephone Encounter (Signed)
[name] had questions about GI meds. I reviewed with her Dr. Doyne Keel recommendations for scheduled PPI and as needed carafate.

## 2019-04-21 ENCOUNTER — Encounter: Payer: Self-pay | Admitting: Internal Medicine

## 2019-04-21 ENCOUNTER — Telehealth: Payer: Self-pay | Admitting: Gastroenterology

## 2019-04-21 NOTE — Telephone Encounter (Signed)
Error

## 2019-04-21 NOTE — Telephone Encounter (Signed)
Called patient and reviewed instructions for carafate

## 2019-04-21 NOTE — Telephone Encounter (Signed)
Thanks for your help Jenny Reichmann

## 2019-04-23 NOTE — Telephone Encounter (Signed)
Called patient and she wasn't sure how she was feeling. I  Told her if she thought the Carafate was bothering her to hold it a day and see if it made a difference

## 2019-04-23 NOTE — Telephone Encounter (Signed)
Pt stated that if she takes more than 1 tablet of carafate a day, then she feels shortness of breath.  Please advise.

## 2019-04-25 ENCOUNTER — Other Ambulatory Visit: Payer: Self-pay

## 2019-04-25 ENCOUNTER — Telehealth (INDEPENDENT_AMBULATORY_CARE_PROVIDER_SITE_OTHER): Payer: Medicare Other | Admitting: Family Medicine

## 2019-04-25 ENCOUNTER — Encounter: Payer: Self-pay | Admitting: Family Medicine

## 2019-04-25 DIAGNOSIS — R110 Nausea: Secondary | ICD-10-CM

## 2019-04-25 DIAGNOSIS — K219 Gastro-esophageal reflux disease without esophagitis: Secondary | ICD-10-CM

## 2019-04-25 DIAGNOSIS — M5030 Other cervical disc degeneration, unspecified cervical region: Secondary | ICD-10-CM | POA: Diagnosis not present

## 2019-04-25 MED ORDER — PROMETHAZINE HCL 12.5 MG PO TABS: ORAL_TABLET | ORAL | 0 refills | Status: DC

## 2019-04-25 MED ORDER — METHOCARBAMOL 500 MG PO TABS: 500.0000 mg | ORAL_TABLET | Freq: Every day | ORAL | 1 refills | Status: DC | PRN

## 2019-04-25 NOTE — Progress Notes (Signed)
Pt c/o promothazine and a medication she spelled out for me methocarbam.

## 2019-04-25 NOTE — Progress Notes (Signed)
Telemedicine Encounter- SOAP NOTE Established Patient  This telephone encounter was conducted with the patient's (or proxy's) verbal consent via audio telecommunications: yes/no: Yes Patient was instructed to have this encounter in a suitably private space; and to only have persons present to whom they give permission to participate. In addition, patient identity was confirmed by use of name plus two identifiers (DOB and address).  I discussed the limitations, risks, security and privacy concerns of performing an evaluation and management service by telephone and the availability of in person appointments. I also discussed with the patient that there may be a patient responsible charge related to this service. The patient expressed understanding and agreed to proceed.  I spent a total of TIME; 0 MIN TO 60 MIN: 15 minutes talking with the patient or their proxy.  CC: nausea, med refill  Subjective   [name] is a 85 y.o. established patient. Telephone visit today for  HPI   She reports that she has severe deformity she feel muscle spasms when she does her yard work or after she goes to the grocery store She only takes one methocarbamol about once or twice a month. She states that she also take her nausea medicine if she has to get out of the house and go to the store because the in and out of the car makes her stomach nauseous She denies vomiting She would like some promethazine    Patient Active Problem List   Diagnosis Date Noted  . Degenerative disc disease, lumbar 01/01/2018  . Hiatal hernia 11/04/2016  . Acute blood loss anemia   . Gastroesophageal reflux disease without esophagitis 07/31/2014  . Gastric ulcer without hemorrhage or perforation 07/22/2013  . History of syphilis 02/27/2012  . Dementia ([place])   . Spondylosis   . Foot deformity   . Migraine   . CAD (coronary artery disease)   . HSV-2 (herpes simplex virus 2) infection   . Abnormal finding on Pap smear,  ASCUS   . Osteoporosis   . CONSTIPATION 12/12/2010  . ANEMIA, IRON DEFICIENCY 06/17/2008  . BARRETTS ESOPHAGUS 06/17/2008  . GLAUCOMA 06/16/2008  . Essential hypertension 06/16/2008  . DIVERTICULOSIS, COLON 06/16/2008  . ARTHRITIS 06/16/2008  . SLEEP APNEA 06/16/2008    Past Medical History:  Diagnosis Date  . Abnormal finding on Pap smear, ASCUS   . Acute renal insufficiency   . Amputation finger    index  . Barrett's esophagus   . CAD (coronary artery disease)   . Dementia ([place])   . Diverticulosis   . Elevated BP   . Foot deformity   . GERD (gastroesophageal reflux disease)   . Glaucoma   . H/O: GI bleed   . HSV-2 (herpes simplex virus 2) infection   . Hyperglycemia   . Hypertension   . Migraine   . Osteoporosis    worsening  . Spondylosis     Current Outpatient Medications  Medication Sig Dispense Refill  . Calcium Carbonate-Vitamin D (CALCIUM-VITAMIN D) 500-200 MG-UNIT tablet Take 3 tablets by mouth every morning.    Water engineer Bandages & Supports (LUMBAR BACK BRACE/SUPPORT PAD) MISC Dispense one back brace to be worn while doing activities as needed daily 1 each 1  . ESTRACE VAGINAL 0.1 MG/GM vaginal cream INSERT 1 APPLICATORFUL VAGINALLY 1 TIME A WEEK 42.5 g 0  . ferrous sulfate (SM IRON SLOW RELEASE) 160 (50 Fe) MG TBCR SR tablet Take 1 tablet (160 mg total) by mouth daily. 30 each 6  .  gabapentin (NEURONTIN) 800 MG tablet Take 1 tablet (800 mg total) by mouth at bedtime. 90 tablet 1  . hydrochlorothiazide (MICROZIDE) 12.5 MG capsule TAKE 1 CAPSULE BY MOUTH EVERY TUESDAY AND SATURDAY 90 capsule 3  . hydrocortisone 2.5 % ointment APPLY EXTERNALLY TO THE AFFECTED AREA TWICE DAILY 28.35 g 0  . hydroxypropyl methylcellulose (ISOPTO TEARS) 2.5 % ophthalmic solution Place 1 drop into both eyes 2 (two) times daily as needed for dry eyes.     Marland Kitchen lactose free nutrition (BOOST) LIQD Take 237 mLs by mouth 2 (two) times daily between meals. (Patient taking differently: Take 1  Container by mouth 2 (two) times daily as needed (nutritional). ) 60 Can 11  . lidocaine (LIDODERM) 5 % Place 1 patch onto the skin daily. Remove & Discard patch within 12 hours or as directed by MD.  Apply to the right side of your neck where it hurts 30 patch 0  . metoprolol succinate (TOPROL-XL) 50 MG 24 hr tablet take 1 tablet by mouth once daily WITH OR IMMEDIATELY FOLLOWING A MEAL 90 tablet 3  . nystatin cream (MYCOSTATIN) APP EXT AA BID FOR 10 DAYS PRN  1  . ofloxacin (OCUFLOX) 0.3 % ophthalmic solution 10 drops in right ear once daily for 5 days. 5 mL 0  . pantoprazole (PROTONIX) 40 MG tablet Take 1 tablet (40 mg total) by mouth 2 (two) times daily. 180 tablet 1  . promethazine (PHENERGAN) 12.5 MG tablet take 1 tablet by mouth every 6 hours if needed for nausea and vomiting 30 tablet 0  . traMADol-acetaminophen (ULTRACET) 37.5-325 MG tablet Take 1 tablet by mouth every 6 (six) hours.  0  . triamcinolone cream (KENALOG) 0.1 % Apply topically 2 (two) times daily. 30 g 1  . verapamil (CALAN-SR) 240 MG CR tablet Take 1 tablet (240 mg total) by mouth daily. 90 tablet 3  . vitamin C (ASCORBIC ACID) 500 MG tablet Take 500 mg by mouth daily.    Marland Kitchen ZINC OXIDE, TOPICAL, 10 % CREA 1 application to affected area to prevent moisture and further irritation of skin. 50 g 0  . methocarbamol (ROBAXIN) 500 MG tablet Take 1 tablet (500 mg total) by mouth daily as needed for muscle spasms. After doing yard work. 10 tablet 1  . sucralfate (CARAFATE) 1 g tablet Take 1 tablet (1 g total) by mouth every 6 (six) hours as needed. Slowly dissolve tablet in 1 Tablespoon of distilled water for 15 minutes before taking (do not crush) (Patient not taking: Reported on 04/25/2019) 360 tablet 0   No current facility-administered medications for this visit.     No Known Allergies  Social History   Socioeconomic History  . Marital status: Single    Spouse name: Not on file  . Number of children: 1  . Years of education:  Not on file  . Highest education level: 12th grade  Occupational History    Employer: RETIRED  Social Needs  . Financial resource strain: Not on file  . Food insecurity:    Worry: Not on file    Inability: Not on file  . Transportation needs:    Medical: Not on file    Non-medical: Not on file  Tobacco Use  . Smoking status: Never Smoker  . Smokeless tobacco: Never Used  Substance and Sexual Activity  . Alcohol use: No    Alcohol/week: 0.0 standard drinks  . Drug use: No  . Sexual activity: Never    Birth control/protection: Post-menopausal  Lifestyle  . Physical activity:    Days per week: Not on file    Minutes per session: Not on file  . Stress: Not on file  Relationships  . Social connections:    Talks on phone: Not on file    Gets together: Not on file    Attends religious service: Not on file    Active member of club or organization: Not on file    Attends meetings of clubs or organizations: Not on file    Relationship status: Not on file  . Intimate partner violence:    Fear of current or ex partner: Not on file    Emotionally abused: Not on file    Physically abused: Not on file    Forced sexual activity: Not on file  Other Topics Concern  . Not on file  Social History Narrative   Marital status: divorced.      Children: 1 son; no grandchildren      Lives: with son      Employment: retired      Tobacco: none      Alcohol: none      Drugs: none       Exercise: none       ADLs:  Independent with ADLs.  Quit driving age 60.  Medical CNA cleans house; makes breakfast; patient pays bills.      Advanced Directives:   YES.  HCPOA:  Son/Isaac Bowman.  FULL CODE; no prolonged measures.    ROS See hpi No palpitations, no chest pains No dysuria, hematuria  Objective   Vitals as reported by the patient: There were no vitals filed for this visit.  Diagnoses and all orders for this visit:  Nausea Gastroesophageal reflux disease without esophagitis Nausea:  Refilled medications for nausea to use sparingly   DDD (degenerative disc disease), cervical Muscle spasm: She should also use the methocarbamol sparingly and avoid taking the medication when her aide is not here.  Other orders -     promethazine (PHENERGAN) 12.5 MG tablet; take 1 tablet by mouth every 6 hours if needed for nausea and vomiting -     methocarbamol (ROBAXIN) 500 MG tablet; Take 1 tablet (500 mg total) by mouth daily as needed for muscle spasms. After doing yard work.     I discussed the assessment and treatment plan with the patient. The patient was provided an opportunity to ask questions and all were answered. The patient agreed with the plan and demonstrated an understanding of the instructions.   The patient was advised to call back or seek an in-person evaluation if the symptoms worsen or if the condition fails to improve as anticipated.  I provided 15 minutes of non-face-to-face time during this encounter.  Forrest Moron, MD  Primary Care at [facility]

## 2019-04-29 ENCOUNTER — Encounter: Payer: Self-pay | Admitting: Gastroenterology

## 2019-05-05 ENCOUNTER — Telehealth: Payer: Self-pay | Admitting: Family Medicine

## 2019-05-05 NOTE — Telephone Encounter (Signed)
Copied from [place] [id number]. Topic: Quick Communication - See Telephone Encounter >> May 05, 2019  4:51 PM Blase Mess A wrote: CRM for notification. See Telephone encounter for: 05/05/19. Patient is calling because she waiting on Dr. Nolon Rod to bring her food yesterday. She has acid reflux and is in of food. And she want to know should she go to Dr. Ronnie Derby office or schedule a virtual visit. Please advise [phone] (H)

## 2019-05-06 ENCOUNTER — Other Ambulatory Visit: Payer: Self-pay

## 2019-05-06 ENCOUNTER — Telehealth (INDEPENDENT_AMBULATORY_CARE_PROVIDER_SITE_OTHER): Payer: Medicare Other | Admitting: Family Medicine

## 2019-05-06 DIAGNOSIS — K257 Chronic gastric ulcer without hemorrhage or perforation: Secondary | ICD-10-CM | POA: Diagnosis not present

## 2019-05-06 NOTE — Progress Notes (Signed)
Telemedicine Encounter- SOAP NOTE Established Patient  This telephone encounter was conducted with the patient's (or proxy's) verbal consent via audio telecommunications: yes/no: Yes Patient was instructed to have this encounter in a suitably private space; and to only have persons present to whom they give permission to participate. In addition, patient identity was confirmed by use of name plus two identifiers (DOB and address).  I discussed the limitations, risks, security and privacy concerns of performing an evaluation and management service by telephone and the availability of in person appointments. I also discussed with the patient that there may be a patient responsible charge related to this service. The patient expressed understanding and agreed to proceed.  I spent a total of TIME; 0 MIN TO 60 MIN: 15 minutes talking with the patient or their proxy.  CC: reflux, nutrition questions  Subjective   [name] is a 85 y.o. established patient. Telephone visit today for  HPI   Patient reports that she has not been eating well because her neighbors and family brings her foods that she cannot eat. She cannot eat greasy foods, tomato products, or honey wheat bread.  She likes oatmeal but she cannot eat that everyday She is taking the protonix but not the sucralfate. She states that she was not sure she could take them together. She states that she gets very bad heart burn and because she cannot stand up straight it is worse. She was called by Dr. Doyne Keel office to follow up but she was not sure if she should go or not. She denies blood per rectum, nausea or vomiting.  Patient Active Problem List   Diagnosis Date Noted  . Degenerative disc disease, lumbar 01/01/2018  . Hiatal hernia 11/04/2016  . Acute blood loss anemia   . Gastroesophageal reflux disease without esophagitis 07/31/2014  . Gastric ulcer without hemorrhage or perforation 07/22/2013  . History of syphilis  02/27/2012  . Dementia ([place])   . Spondylosis   . Foot deformity   . Migraine   . CAD (coronary artery disease)   . HSV-2 (herpes simplex virus 2) infection   . Abnormal finding on Pap smear, ASCUS   . Osteoporosis   . CONSTIPATION 12/12/2010  . ANEMIA, IRON DEFICIENCY 06/17/2008  . BARRETTS ESOPHAGUS 06/17/2008  . GLAUCOMA 06/16/2008  . Essential hypertension 06/16/2008  . DIVERTICULOSIS, COLON 06/16/2008  . ARTHRITIS 06/16/2008  . SLEEP APNEA 06/16/2008    Past Medical History:  Diagnosis Date  . Abnormal finding on Pap smear, ASCUS   . Acute renal insufficiency   . Amputation finger    index  . Barrett's esophagus   . CAD (coronary artery disease)   . Dementia ([place])   . Diverticulosis   . Elevated BP   . Foot deformity   . GERD (gastroesophageal reflux disease)   . Glaucoma   . H/O: GI bleed   . HSV-2 (herpes simplex virus 2) infection   . Hyperglycemia   . Hypertension   . Migraine   . Osteoporosis    worsening  . Spondylosis     Current Outpatient Medications  Medication Sig Dispense Refill  . Calcium Carbonate-Vitamin D (CALCIUM-VITAMIN D) 500-200 MG-UNIT tablet Take 3 tablets by mouth every morning.    Water engineer Bandages & Supports (LUMBAR BACK BRACE/SUPPORT PAD) MISC Dispense one back brace to be worn while doing activities as needed daily 1 each 1  . ESTRACE VAGINAL 0.1 MG/GM vaginal cream INSERT 1 APPLICATORFUL VAGINALLY 1 TIME A WEEK 42.5  g 0  . ferrous sulfate (SM IRON SLOW RELEASE) 160 (50 Fe) MG TBCR SR tablet Take 1 tablet (160 mg total) by mouth daily. 30 each 6  . gabapentin (NEURONTIN) 800 MG tablet Take 1 tablet (800 mg total) by mouth at bedtime. 90 tablet 1  . hydrochlorothiazide (MICROZIDE) 12.5 MG capsule TAKE 1 CAPSULE BY MOUTH EVERY TUESDAY AND SATURDAY 90 capsule 3  . hydrocortisone 2.5 % ointment APPLY EXTERNALLY TO THE AFFECTED AREA TWICE DAILY 28.35 g 0  . hydroxypropyl methylcellulose (ISOPTO TEARS) 2.5 % ophthalmic solution Place 1  drop into both eyes 2 (two) times daily as needed for dry eyes.     Marland Kitchen lactose free nutrition (BOOST) LIQD Take 237 mLs by mouth 2 (two) times daily between meals. (Patient taking differently: Take 1 Container by mouth 2 (two) times daily as needed (nutritional). ) 60 Can 11  . lidocaine (LIDODERM) 5 % Place 1 patch onto the skin daily. Remove & Discard patch within 12 hours or as directed by MD.  Apply to the right side of your neck where it hurts 30 patch 0  . methocarbamol (ROBAXIN) 500 MG tablet Take 1 tablet (500 mg total) by mouth daily as needed for muscle spasms. After doing yard work. 10 tablet 1  . metoprolol succinate (TOPROL-XL) 50 MG 24 hr tablet take 1 tablet by mouth once daily WITH OR IMMEDIATELY FOLLOWING A MEAL 90 tablet 3  . nystatin cream (MYCOSTATIN) APP EXT AA BID FOR 10 DAYS PRN  1  . ofloxacin (OCUFLOX) 0.3 % ophthalmic solution 10 drops in right ear once daily for 5 days. 5 mL 0  . pantoprazole (PROTONIX) 40 MG tablet Take 1 tablet (40 mg total) by mouth 2 (two) times daily. 180 tablet 1  . promethazine (PHENERGAN) 12.5 MG tablet take 1 tablet by mouth every 6 hours if needed for nausea and vomiting 30 tablet 0  . sucralfate (CARAFATE) 1 g tablet Take 1 tablet (1 g total) by mouth every 6 (six) hours as needed. Slowly dissolve tablet in 1 Tablespoon of distilled water for 15 minutes before taking (do not crush) (Patient not taking: Reported on 04/25/2019) 360 tablet 0  . traMADol-acetaminophen (ULTRACET) 37.5-325 MG tablet Take 1 tablet by mouth every 6 (six) hours.  0  . triamcinolone cream (KENALOG) 0.1 % Apply topically 2 (two) times daily. 30 g 1  . verapamil (CALAN-SR) 240 MG CR tablet Take 1 tablet (240 mg total) by mouth daily. 90 tablet 3  . vitamin C (ASCORBIC ACID) 500 MG tablet Take 500 mg by mouth daily.    Marland Kitchen ZINC OXIDE, TOPICAL, 10 % CREA 1 application to affected area to prevent moisture and further irritation of skin. 50 g 0   No current facility-administered  medications for this visit.     No Known Allergies  Social History   Socioeconomic History  . Marital status: Single    Spouse name: Not on file  . Number of children: 1  . Years of education: Not on file  . Highest education level: 12th grade  Occupational History    Employer: RETIRED  Social Needs  . Financial resource strain: Not on file  . Food insecurity:    Worry: Not on file    Inability: Not on file  . Transportation needs:    Medical: Not on file    Non-medical: Not on file  Tobacco Use  . Smoking status: Never Smoker  . Smokeless tobacco: Never Used  Substance and  Sexual Activity  . Alcohol use: No    Alcohol/week: 0.0 standard drinks  . Drug use: No  . Sexual activity: Never    Birth control/protection: Post-menopausal  Lifestyle  . Physical activity:    Days per week: Not on file    Minutes per session: Not on file  . Stress: Not on file  Relationships  . Social connections:    Talks on phone: Not on file    Gets together: Not on file    Attends religious service: Not on file    Active member of club or organization: Not on file    Attends meetings of clubs or organizations: Not on file    Relationship status: Not on file  . Intimate partner violence:    Fear of current or ex partner: Not on file    Emotionally abused: Not on file    Physically abused: Not on file    Forced sexual activity: Not on file  Other Topics Concern  . Not on file  Social History Narrative   Marital status: divorced.      Children: 1 son; no grandchildren      Lives: with son      Employment: retired      Tobacco: none      Alcohol: none      Drugs: none       Exercise: none       ADLs:  Independent with ADLs.  Quit driving age 43.  Medical CNA cleans house; makes breakfast; patient pays bills.      Advanced Directives:   YES.  HCPOA:  Son/Isaac Bowman.  FULL CODE; no prolonged measures.    ROS Review of Systems  Constitutional: Negative for activity change, +poor  appetite HENT: Negative for congestion, nosebleeds, trouble swallowing and voice change.   Respiratory: Negative for cough, shortness of breath and wheezing.   Gastrointestinal: Negative for diarrhea, nausea and vomiting.  Genitourinary: Negative for difficulty urinating, dysuria, flank pain and hematuria.  Musculoskeletal: Negative for back pain, joint swelling and neck pain.  Neurological: Negative for dizziness, speech difficulty, light-headedness and numbness.  See HPI. All other review of systems negative.   Objective   Vitals as reported by the patient: There were no vitals filed for this visit.  Diagnoses and all orders for this visit:  Chronic gastric ulcer without hemorrhage and without perforation  reviewed notes from Dr. Havery Moros Pt was advised to hold sucralfate in April Her pain was worse off the sucralfate She said she just can't take it tid Advised to take protonix in the moring then follow up with e GI]  I discussed the assessment and treatment plan with the patient. The patient was provided an opportunity to ask questions and all were answered. The patient agreed with the plan and demonstrated an understanding of the instructions.   The patient was advised to call back or seek an in-person evaluation if the symptoms worsen or if the condition fails to improve as anticipated.  I provided 15 minutes of non-face-to-face time during this encounter.  Forrest Moron, MD  Primary Care at [facility]

## 2019-05-06 NOTE — Telephone Encounter (Signed)
Spoke with pt and advised dr Nolon Rod will p/u and speak with her about her food concerns.  Pt agreeable.

## 2019-05-07 ENCOUNTER — Telehealth: Payer: Self-pay | Admitting: Family Medicine

## 2019-05-07 NOTE — Telephone Encounter (Signed)
Copied from [place]. Topic: Compliment - Provider (non-sensitive) >> May 07, 2019  1:34 PM [name] wrote: [name] just wanted to tell Dr. Nolon Rod she is sorry she missed her yesterday and thank you for everything she has done for her / Pt stated she has a hard time reaching Dr. Nolon Rod please advise

## 2019-05-07 NOTE — Telephone Encounter (Signed)
Please see note below. 

## 2019-05-07 NOTE — Telephone Encounter (Signed)
Copied from [place]. Topic: Compliment - Provider (non-sensitive) >> May 07, 2019  1:34 PM Sharene Skeans wrote: Ms. Grondin just wanted to tell Dr. Nolon Rod she is sorry she missed her yesterday and thank you for everything she has done for her / Pt stated she has a hard time reaching Dr. Nolon Rod please advise

## 2019-05-07 NOTE — Telephone Encounter (Signed)
Copied from [place]. Topic: Compliment - Provider (non-sensitive) >> May 07, 2019  1:34 PM Sharene Skeans wrote: [name] just wanted to tell Dr. Nolon Rod she is sorry she missed her yesterday and thank you for everything she has done for her / Pt stated she has a hard time reaching Dr. Nolon Rod please advise

## 2019-05-08 NOTE — Telephone Encounter (Signed)
Will call the patient

## 2019-05-09 ENCOUNTER — Telehealth: Payer: Medicare Other | Admitting: Family Medicine

## 2019-05-13 ENCOUNTER — Encounter: Payer: Self-pay | Admitting: Gastroenterology

## 2019-05-13 ENCOUNTER — Other Ambulatory Visit: Payer: Self-pay

## 2019-05-13 ENCOUNTER — Ambulatory Visit (INDEPENDENT_AMBULATORY_CARE_PROVIDER_SITE_OTHER): Payer: Medicare Other | Admitting: Gastroenterology

## 2019-05-13 DIAGNOSIS — K2100 Gastro-esophageal reflux disease with esophagitis, without bleeding: Secondary | ICD-10-CM

## 2019-05-13 DIAGNOSIS — D649 Anemia, unspecified: Secondary | ICD-10-CM | POA: Diagnosis not present

## 2019-05-13 DIAGNOSIS — K449 Diaphragmatic hernia without obstruction or gangrene: Secondary | ICD-10-CM

## 2019-05-13 DIAGNOSIS — K2270 Barrett's esophagus without dysplasia: Secondary | ICD-10-CM | POA: Diagnosis not present

## 2019-05-13 NOTE — Progress Notes (Signed)
THIS ENCOUNTER IS A VIRTUAL VISIT DUE TO COVID-19 - PATIENT WAS NOT SEEN IN THE OFFICE. PATIENT HAS CONSENTED TO VIRTUAL VISIT / TELEMEDICINE VISIT   Location of patient: home Location of provider: office Persons participating: myself, patient  HPI :  85 year old female with chronic dyspepsia and history of large hiatal hernia, with history of [place] lesions in short segment Barrett's esophagus, anemia, here for follow-up visit.  Recall that she had anemia in early January which led to EGD with me on 01/07/2019 which showed a 9cm hiatal hernia, along with LA grade C esophagitis, and a suspect C1M2 segment of Barrett's esophagus. She was placed on protonix 40mg  twice daily and added liquid carafate to her regimen and to stop all NSAIDs.  Biopsies were c/w Barrett's but without any dysplasia. She had a repeat CBC showing Hgb went from low of 8.9 and back up to 11.4, done on 2/11.   Since I have last seen her she states she has been doing well. She has called into the office a few times, a bit confused about her medication regimen of pantoprazole and carafate. On protonix 40mg  BID she reports her pyrosis has completely resolved and not having much regurgitation or indigestion. She takes carafate on average about once per day, asking if she can stop it. She makes a slurry about if it when she uses it. She is eating well, denies weight loss. She has tried to avoid trigger foods such as spaghetti and chocolate which can precipitate her symptoms. Generally she is happy with how she is doing. Has a lot of questions about her medications.    Prior workup: EGD 01/17/2016 -1cmsegment of Barrett's esophagus,nodysplasia,7cm hiatal hernia, superficial proximal gastric ulceration, ? Lysbeth Galas lesion, normal remainder of examined stomach and duodenum - biopsies taken to rule out H pylori-  EGD 10/22/2016 - done to evaluate dark emesis, large hiatal hernia, otherwise short segment BE without any pathology  noted to cause bleeding EGD 01/07/2019 - 9cm hiatal hernia, along with LA grade C esophagitis, and a C1M2 segment of Barrett's esophagus  Colonoscopy 2009 - diverticulosis Colonoscopy 02/2010 - stercoral ulcers in the rectum, diverticulosis, no polyps CT abdomen 2015 - large hiatal hernia, no mass lesions   Past Medical History:  Diagnosis Date  . Abnormal finding on Pap smear, ASCUS   . Acute renal insufficiency   . Amputation finger    index  . Barrett's esophagus   . CAD (coronary artery disease)   . Dementia ([place])   . Diverticulosis   . Elevated BP   . Foot deformity   . GERD (gastroesophageal reflux disease)   . Glaucoma   . H/O: GI bleed   . HSV-2 (herpes simplex virus 2) infection   . Hyperglycemia   . Hypertension   . Migraine   . Osteoporosis    worsening  . Spondylosis      Past Surgical History:  Procedure Laterality Date  . AMPUTATION FINGER / THUMB     secondary to osteomyelitis  . CATARACT EXTRACTION, BILATERAL Bilateral 12/25/2016  . COLONOSCOPY    . ESOPHAGOGASTRODUODENOSCOPY N/A 10/22/2016   Procedure: ESOPHAGOGASTRODUODENOSCOPY (EGD);  Surgeon: Doran Stabler, MD;  [facility];  Service: Endoscopy;  Laterality: N/A;  . FOOT SURGERY     right  . PARTIAL HYSTERECTOMY    . UPPER GASTROINTESTINAL ENDOSCOPY     Family History  Problem Relation Age of Onset  . Breast cancer Mother   . Heart attack Mother   .  Heart disease Father   . Alcohol abuse Son   . Colon cancer Neg Hx   . Stomach cancer Neg Hx   . Rectal cancer Neg Hx    Social History   Tobacco Use  . Smoking status: Never Smoker  . Smokeless tobacco: Never Used  Substance Use Topics  . Alcohol use: No    Alcohol/week: 0.0 standard drinks  . Drug use: No   Current Outpatient Medications  Medication Sig Dispense Refill  . Calcium Carbonate-Vitamin D (CALCIUM-VITAMIN D) 500-200 MG-UNIT tablet Take 3 tablets by mouth every morning.    Water engineer Bandages & Supports  (LUMBAR BACK BRACE/SUPPORT PAD) MISC Dispense one back brace to be worn while doing activities as needed daily 1 each 1  . ESTRACE VAGINAL 0.1 MG/GM vaginal cream INSERT 1 APPLICATORFUL VAGINALLY 1 TIME A WEEK 42.5 g 0  . ferrous sulfate (SM IRON SLOW RELEASE) 160 (50 Fe) MG TBCR SR tablet Take 1 tablet (160 mg total) by mouth daily. 30 each 6  . gabapentin (NEURONTIN) 800 MG tablet Take 1 tablet (800 mg total) by mouth at bedtime. 90 tablet 1  . hydrochlorothiazide (MICROZIDE) 12.5 MG capsule TAKE 1 CAPSULE BY MOUTH EVERY TUESDAY AND SATURDAY 90 capsule 3  . hydrocortisone 2.5 % ointment APPLY EXTERNALLY TO THE AFFECTED AREA TWICE DAILY 28.35 g 0  . hydroxypropyl methylcellulose (ISOPTO TEARS) 2.5 % ophthalmic solution Place 1 drop into both eyes 2 (two) times daily as needed for dry eyes.     Marland Kitchen lactose free nutrition (BOOST) LIQD Take 237 mLs by mouth 2 (two) times daily between meals. (Patient taking differently: Take 1 Container by mouth 2 (two) times daily as needed (nutritional). ) 60 Can 11  . lidocaine (LIDODERM) 5 % Place 1 patch onto the skin daily. Remove & Discard patch within 12 hours or as directed by MD.  Apply to the right side of your neck where it hurts 30 patch 0  . methocarbamol (ROBAXIN) 500 MG tablet Take 1 tablet (500 mg total) by mouth daily as needed for muscle spasms. After doing yard work. 10 tablet 1  . metoprolol succinate (TOPROL-XL) 50 MG 24 hr tablet take 1 tablet by mouth once daily WITH OR IMMEDIATELY FOLLOWING A MEAL 90 tablet 3  . nystatin cream (MYCOSTATIN) APP EXT AA BID FOR 10 DAYS PRN  1  . ofloxacin (OCUFLOX) 0.3 % ophthalmic solution 10 drops in right ear once daily for 5 days. 5 mL 0  . pantoprazole (PROTONIX) 40 MG tablet Take 1 tablet (40 mg total) by mouth 2 (two) times daily. 180 tablet 1  . promethazine (PHENERGAN) 12.5 MG tablet take 1 tablet by mouth every 6 hours if needed for nausea and vomiting 30 tablet 0  . sucralfate (CARAFATE) 1 g tablet Take 1  tablet (1 g total) by mouth every 6 (six) hours as needed. Slowly dissolve tablet in 1 Tablespoon of distilled water for 15 minutes before taking (do not crush) 360 tablet 0  . traMADol-acetaminophen (ULTRACET) 37.5-325 MG tablet Take 1 tablet by mouth every 6 (six) hours.  0  . triamcinolone cream (KENALOG) 0.1 % Apply topically 2 (two) times daily. 30 g 1  . verapamil (CALAN-SR) 240 MG CR tablet Take 1 tablet (240 mg total) by mouth daily. 90 tablet 3  . vitamin C (ASCORBIC ACID) 500 MG tablet Take 500 mg by mouth daily.    Marland Kitchen ZINC OXIDE, TOPICAL, 10 % CREA 1 application to affected area to prevent  moisture and further irritation of skin. 50 g 0   No current facility-administered medications for this visit.    No Known Allergies   Review of Systems: All systems reviewed and negative except where noted in HPI.   CBC Latest Ref Rng & Units 02/04/2019 01/07/2019 01/03/2019  WBC 4.0 - 10.5 K/uL 4.9 - -  Hemoglobin 12.0 - 15.0 g/dL 11.4(L) 9.5(L) 8.9 Repeated and verified X2.(L)  Hematocrit 36.0 - 46.0 % 35.2(L) - -  Platelets 150.0 - 400.0 K/uL 402.0(H) - -   Lab Results  Component Value Date   IRON 127 01/01/2019   TIBC 328 01/01/2019   FERRITIN 23 01/01/2019    Physical Exam: NA.   ASSESSMENT AND PLAN:  Esophagitis / Barrett's esophagus / hiatal hernia / anemia - overall doing well. Her anemia and symptoms have responded to conservative measures with PPI and carafate. She has a very large hiatal hernia that predisposes her to reflux, Barrettt's, and anemia from [place] lesions. Fixing the hernia would require surgery, which we both wish to avoid given her age and comorbidities, if this can be managed with medication. We have discussed long term risks of chronic PPI use, she understands this and wishes to continue, I think with the size of her hernia she will have quick rebound of symptoms if she comes off it. Further, indefinite PPI is recommended in light of her BE. I don't think she  needs to take carafate scheduled, she can take it as needed for breakthrough pyrosis. She can follow up with me as needed. Given her age and comorbidities I don't think further surveillance endoscopy is warranted, but she can follow up with me as needed. I went through her medications with her in detail, she verbalized understanding.  [place]ak[place] Cellar, MD [facility]

## 2019-05-14 ENCOUNTER — Ambulatory Visit (INDEPENDENT_AMBULATORY_CARE_PROVIDER_SITE_OTHER): Payer: Medicare Other | Admitting: Family Medicine

## 2019-05-14 ENCOUNTER — Other Ambulatory Visit: Payer: Self-pay

## 2019-05-14 VITALS — BP 128/68 | HR 84 | Temp 98.3°F | Resp 16 | Ht <= 58 in | Wt 113.0 lb

## 2019-05-14 DIAGNOSIS — L819 Disorder of pigmentation, unspecified: Secondary | ICD-10-CM | POA: Diagnosis not present

## 2019-05-14 DIAGNOSIS — R350 Frequency of micturition: Secondary | ICD-10-CM

## 2019-05-14 DIAGNOSIS — N898 Other specified noninflammatory disorders of vagina: Secondary | ICD-10-CM

## 2019-05-14 DIAGNOSIS — K5900 Constipation, unspecified: Secondary | ICD-10-CM

## 2019-05-14 LAB — POCT URINALYSIS DIP (MANUAL ENTRY)
Bilirubin, UA: NEGATIVE
Blood, UA: NEGATIVE
Glucose, UA: NEGATIVE mg/dL
Ketones, POC UA: NEGATIVE mg/dL
Leukocytes, UA: NEGATIVE
Nitrite, UA: NEGATIVE
Protein Ur, POC: NEGATIVE mg/dL
Spec Grav, UA: <=1.005 — AB (ref 1.010–1.025)
Urobilinogen, UA: 0.2 E.U./dL
pH, UA: 6.5 (ref 5.0–8.0)

## 2019-05-14 NOTE — Progress Notes (Signed)
Subjective:    Patient ID: [name], female    DOB: [birth year], 85 y.o.   MRN: [id number]  HPI [name] is a 85 y.o. female Presents today for: Chief Complaint  Patient presents with  . Urinary Frequency    x 1wk  . Vaginal Itching    x 2-3 wks   Presents for above symptoms.  Has been treated with Estrace in the past.  Vaginal itching - "long time ago". Using estrace cream - about pea sized amount daily - tying to make last. About 1 tube per month.  Notes more at bedtime.   Some difficulty with history - clarified with repeat questions and clarification. Some frequency of urination on Tuesday and Saturday - "always urinate more as got older". No recent changes.   Only occasional burning with urination. No fever, no abdominal pain.  Having BM 2-3 times per day, small balls of stool. No current meds for constipation. miralax recommended in past - not used in years. Thinks miralax caused too much stooling?   Has been followed by OBGYN - unknown name - once per year.    Patient Active Problem List   Diagnosis Date Noted  . Degenerative disc disease, lumbar 01/01/2018  . Hiatal hernia 11/04/2016  . Acute blood loss anemia   . Gastroesophageal reflux disease without esophagitis 07/31/2014  . Gastric ulcer without hemorrhage or perforation 07/22/2013  . History of syphilis 02/27/2012  . Dementia ([place])   . Spondylosis   . Foot deformity   . Migraine   . CAD (coronary artery disease)   . HSV-2 (herpes simplex virus 2) infection   . Abnormal finding on Pap smear, ASCUS   . Osteoporosis   . CONSTIPATION 12/12/2010  . ANEMIA, IRON DEFICIENCY 06/17/2008  . BARRETTS ESOPHAGUS 06/17/2008  . GLAUCOMA 06/16/2008  . Essential hypertension 06/16/2008  . DIVERTICULOSIS, COLON 06/16/2008  . ARTHRITIS 06/16/2008  . SLEEP APNEA 06/16/2008   Past Medical History:  Diagnosis Date  . Abnormal finding on Pap smear, ASCUS   . Acute renal insufficiency   . Amputation finger    index   . Barrett's esophagus   . CAD (coronary artery disease)   . Dementia ([place])   . Diverticulosis   . Elevated BP   . Foot deformity   . GERD (gastroesophageal reflux disease)   . Glaucoma   . H/O: GI bleed   . HSV-2 (herpes simplex virus 2) infection   . Hyperglycemia   . Hypertension   . Migraine   . Osteoporosis    worsening  . Spondylosis    Past Surgical History:  Procedure Laterality Date  . AMPUTATION FINGER / THUMB     secondary to osteomyelitis  . CATARACT EXTRACTION, BILATERAL Bilateral 12/25/2016  . COLONOSCOPY    . ESOPHAGOGASTRODUODENOSCOPY N/A 10/22/2016   Procedure: ESOPHAGOGASTRODUODENOSCOPY (EGD);  Surgeon: Doran Stabler, MD;  Location: [facility] ENDOSCOPY;  Service: Endoscopy;  Laterality: N/A;  . FOOT SURGERY     right  . PARTIAL HYSTERECTOMY    . UPPER GASTROINTESTINAL ENDOSCOPY     No Known Allergies Prior to Admission medications   Medication Sig Start Date End Date Taking? Authorizing Provider  Calcium Carbonate-Vitamin D (CALCIUM-VITAMIN D) 500-200 MG-UNIT tablet Take 3 tablets by mouth every morning.   Yes [provider]  Elastic Bandages & Supports (LUMBAR BACK BRACE/SUPPORT PAD) MISC Dispense one back brace to be worn while doing activities as needed daily 01/27/19  Yes Forrest Moron, MD  ESTRACE  VAGINAL 0.1 MG/GM vaginal cream INSERT 1 APPLICATORFUL VAGINALLY 1 TIME A WEEK 03/14/19  Yes Stallings, Zoe A, MD  ferrous sulfate (SM IRON SLOW RELEASE) 160 (50 Fe) MG TBCR SR tablet Take 1 tablet (160 mg total) by mouth daily. 01/01/19  Yes Delia Chimes A, MD  gabapentin (NEURONTIN) 800 MG tablet Take 1 tablet (800 mg total) by mouth at bedtime. 04/07/19  Yes Stallings, Zoe A, MD  hydrochlorothiazide (MICROZIDE) 12.5 MG capsule TAKE 1 CAPSULE BY MOUTH EVERY TUESDAY AND SATURDAY 12/12/18  Yes Stallings, Zoe A, MD  hydrocortisone 2.5 % ointment APPLY EXTERNALLY TO THE AFFECTED AREA TWICE DAILY 05/31/18  Yes Wardell Honour, MD  hydroxypropyl  methylcellulose (ISOPTO TEARS) 2.5 % ophthalmic solution Place 1 drop into both eyes 2 (two) times daily as needed for dry eyes.    Yes [provider]  lactose free nutrition (BOOST) LIQD Take 237 mLs by mouth 2 (two) times daily between meals. Patient taking differently: Take 1 Container by mouth 2 (two) times daily as needed (nutritional).  03/22/16  Yes [name], MD  lidocaine (LIDODERM) 5 % Place 1 patch onto the skin daily. Remove & Discard patch within 12 hours or as directed by MD.  Apply to the right side of your neck where it hurts 05/25/18  Yes Blanchie Dessert, MD  methocarbamol (ROBAXIN) 500 MG tablet Take 1 tablet (500 mg total) by mouth daily as needed for muscle spasms. After doing yard work. 04/25/19  Yes Delia Chimes A, MD  metoprolol succinate (TOPROL-XL) 50 MG 24 hr tablet take 1 tablet by mouth once daily WITH OR IMMEDIATELY FOLLOWING A MEAL 12/12/18  Yes Stallings, Zoe A, MD  nystatin cream (MYCOSTATIN) APP EXT AA BID FOR 10 DAYS PRN 08/11/18  Yes [provider]  ofloxacin (OCUFLOX) 0.3 % ophthalmic solution 10 drops in right ear once daily for 5 days. 01/29/19  Yes Yu, Amy V, PA-C  pantoprazole (PROTONIX) 40 MG tablet Take 1 tablet (40 mg total) by mouth 2 (two) times daily. 03/18/19  Yes Armbruster, Carlota Raspberry, MD  promethazine (PHENERGAN) 12.5 MG tablet take 1 tablet by mouth every 6 hours if needed for nausea and vomiting 04/25/19  Yes Stallings, Zoe A, MD  sucralfate (CARAFATE) 1 g tablet Take 1 tablet (1 g total) by mouth every 6 (six) hours as needed. Slowly dissolve tablet in 1 Tablespoon of distilled water for 15 minutes before taking (do not crush) 01/09/19  Yes Armbruster, Carlota Raspberry, MD  traMADol-acetaminophen (ULTRACET) 37.5-325 MG tablet Take 1 tablet by mouth every 6 (six) hours. 10/25/17  Yes [provider]  triamcinolone cream (KENALOG) 0.1 % Apply topically 2 (two) times daily. 09/04/18  Yes Stallings, Zoe A, MD  verapamil (CALAN-SR) 240 MG CR  tablet Take 1 tablet (240 mg total) by mouth daily. 12/12/18  Yes Forrest Moron, MD  vitamin C (ASCORBIC ACID) 500 MG tablet Take 500 mg by mouth daily.   Yes [provider]  ZINC OXIDE, TOPICAL, 10 % CREA 1 application to affected area to prevent moisture and further irritation of skin. 01/29/19  Yes Ok Edwards, PA-C   Social History   Socioeconomic History  . Marital status: Single    Spouse name: Not on file  . Number of children: 1  . Years of education: Not on file  . Highest education level: 12th grade  Occupational History    Employer: RETIRED  Social Needs  . Financial resource strain: Not on file  .  Food insecurity:    Worry: Not on file    Inability: Not on file  . Transportation needs:    Medical: Not on file    Non-medical: Not on file  Tobacco Use  . Smoking status: Never Smoker  . Smokeless tobacco: Never Used  Substance and Sexual Activity  . Alcohol use: No    Alcohol/week: 0.0 standard drinks  . Drug use: No  . Sexual activity: Never    Birth control/protection: Post-menopausal  Lifestyle  . Physical activity:    Days per week: Not on file    Minutes per session: Not on file  . Stress: Not on file  Relationships  . Social connections:    Talks on phone: Not on file    Gets together: Not on file    Attends religious service: Not on file    Active member of club or organization: Not on file    Attends meetings of clubs or organizations: Not on file    Relationship status: Not on file  . Intimate partner violence:    Fear of current or ex partner: Not on file    Emotionally abused: Not on file    Physically abused: Not on file    Forced sexual activity: Not on file  Other Topics Concern  . Not on file  Social History Narrative   Marital status: divorced.      Children: 1 son; no grandchildren      Lives: with son      Employment: retired      Tobacco: none      Alcohol: none      Drugs: none       Exercise: none       ADLs:   Independent with ADLs.  Quit driving age 4.  Medical CNA cleans house; makes breakfast; patient pays bills.      Advanced Directives:   YES.  HCPOA:  Son/Isaac Bowman.  FULL CODE; no prolonged measures.    Review of Systems Per HPI.     Objective:   Physical Exam Vitals signs and nursing note reviewed. Exam conducted with a chaperone present.  Constitutional:      General: She is not in acute distress.    Appearance: She is well-developed.  HENT:     Head: Normocephalic and atraumatic.  Cardiovascular:     Rate and Rhythm: Normal rate.  Pulmonary:     Effort: Pulmonary effort is normal.  Genitourinary:   Neurological:     Mental Status: She is alert and oriented to person, place, and time.    Vitals:   05/14/19 1505  BP: 128/68  Pulse: 84  Resp: 16  Temp: 98.3 F (36.8 C)  TempSrc: Oral  SpO2: 95%  Weight: 113 lb (51.3 kg)  Height: 4\' 10"  (1.473 m)   Results for orders placed or performed in visit on 05/14/19  Wet Prep for Trick, Yeast, Clue  Result Value Ref Range   Trichomonas Exam Negative Negative   Yeast Exam Negative Negative   Clue Cell Exam Negative Negative  POCT urinalysis dipstick  Result Value Ref Range   Color, UA yellow yellow   Clarity, UA clear clear   Glucose, UA negative negative mg/dL   Bilirubin, UA negative negative   Ketones, POC UA negative negative mg/dL   Spec Grav, UA <=1.005 (A) 1.010 - 1.025   Blood, UA negative negative   pH, UA 6.5 5.0 - 8.0   Protein Ur, POC negative negative mg/dL  Urobilinogen, UA 0.2 0.2 or 1.0 E.U./dL   Nitrite, UA Negative Negative   Leukocytes, UA Negative Negative      Assessment & Plan:   Alizza Sacra is a 85 y.o. female Urinary frequency - Plan: POCT urinalysis dipstick  - unliklely infection. no sign of infection on wet prep or urinalysis. Notes on days of diuretic use. rtc precautions.    Vaginal itching - Plan: Wet Prep for Trick, Yeast, Clue Skin hypopigmentation  - possible component of  atrophic vaginitis, but with areas of hypopigmentation, will have eval by obgyn. ddx includes lichen, may need biopsy of not evaluated prior by obgyn.   Constipation, unspecified constipation type  - prevention and treatment techniques discussed, colace,  handout given.   No orders of the defined types were placed in this encounter.  Patient Instructions    For constipation - can try colace stool softener over the counter, drink plenty of fluids and fiber in the diet. See information below.  There is a an area of lighter skin on the labia/vaginal area externally.  I want to make sure that your OB/GYN has seen this area and is aware and if not it may need to be biopsied.  I will check with our staff to see if we can arrange an appointment  Okay to use the Estrace vaginal cream daily for now.   Constipation, Adult Constipation is when a person has fewer bowel movements in a week than normal, has difficulty having a bowel movement, or has stools that are dry, hard, or larger than normal. Constipation may be caused by an underlying condition. It may become worse with age if a person takes certain medicines and does not take in enough fluids. Follow these instructions at home: Eating and drinking   Eat foods that have a lot of fiber, such as fresh fruits and vegetables, whole grains, and beans.  Limit foods that are high in fat, low in fiber, or overly processed, such as french fries, hamburgers, cookies, candies, and soda.  Drink enough fluid to keep your urine clear or pale yellow. General instructions  Exercise regularly or as told by your health care provider.  Go to the restroom when you have the urge to go. Do not hold it in.  Take over-the-counter and prescription medicines only as told by your health care provider. These include any fiber supplements.  Practice pelvic floor retraining exercises, such as deep breathing while relaxing the lower abdomen and pelvic floor relaxation  during bowel movements.  Watch your condition for any changes.  Keep all follow-up visits as told by your health care provider. This is important. Contact a health care provider if:  You have pain that gets worse.  You have a fever.  You do not have a bowel movement after 4 days.  You vomit.  You are not hungry.  You lose weight.  You are bleeding from the anus.  You have thin, pencil-like stools. Get help right away if:  You have a fever and your symptoms suddenly get worse.  You leak stool or have blood in your stool.  Your abdomen is bloated.  You have severe pain in your abdomen.  You feel dizzy or you faint. This information is not intended to replace advice given to you by your health care provider. Make sure you discuss any questions you have with your health care provider. Document Released: 09/08/2004 Document Revised: 06/30/2016 Document Reviewed: 05/31/2016 Elsevier Interactive Patient Education  2019 [employer].  If you have lab work done today you will be contacted with your lab results within the next 2 weeks.  If you have not heard from [place] then please contact us. The fastest way to get your results is to register for My Chart.   IF you received an x-ray today, you will receive an invoice from [facility]. Please contact [facility] at [phone] with questions or concerns regarding your invoice.   IF you received labwork today, you will receive an invoice from [place]. Please contact [facility] at [phone] with questions or concerns regarding your invoice.   Our billing staff will not be able to assist you with questions regarding bills from these companies.  You will be contacted with the lab results as soon as they are available. The fastest way to get your results is to activate your My Chart account. Instructions are located on the last page of this paperwork. If you have not heard from [place] regarding the results in 2 weeks,  please contact this office.       Signed,   Merri Ray, MD [facility].  05/18/19 9:46 AM

## 2019-05-14 NOTE — Patient Instructions (Addendum)
For constipation - can try colace stool softener over the counter, drink plenty of fluids and fiber in the diet. See information below.  There is a an area of lighter skin on the labia/vaginal area externally.  I want to make sure that your OB/GYN has seen this area and is aware and if not it may need to be biopsied.  I will check with our staff to see if we can arrange an appointment  Okay to use the Estrace vaginal cream daily for now.   Constipation, Adult Constipation is when a person has fewer bowel movements in a week than normal, has difficulty having a bowel movement, or has stools that are dry, hard, or larger than normal. Constipation may be caused by an underlying condition. It may become worse with age if a person takes certain medicines and does not take in enough fluids. Follow these instructions at home: Eating and drinking   Eat foods that have a lot of fiber, such as fresh fruits and vegetables, whole grains, and beans.  Limit foods that are high in fat, low in fiber, or overly processed, such as french fries, hamburgers, cookies, candies, and soda.  Drink enough fluid to keep your urine clear or pale yellow. General instructions  Exercise regularly or as told by your health care provider.  Go to the restroom when you have the urge to go. Do not hold it in.  Take over-the-counter and prescription medicines only as told by your health care provider. These include any fiber supplements.  Practice pelvic floor retraining exercises, such as deep breathing while relaxing the lower abdomen and pelvic floor relaxation during bowel movements.  Watch your condition for any changes.  Keep all follow-up visits as told by your health care provider. This is important. Contact a health care provider if:  You have pain that gets worse.  You have a fever.  You do not have a bowel movement after 4 days.  You vomit.  You are not hungry.  You lose weight.  You are bleeding  from the anus.  You have thin, pencil-like stools. Get help right away if:  You have a fever and your symptoms suddenly get worse.  You leak stool or have blood in your stool.  Your abdomen is bloated.  You have severe pain in your abdomen.  You feel dizzy or you faint. This information is not intended to replace advice given to you by your health care provider. Make sure you discuss any questions you have with your health care provider. Document Released: 09/08/2004 Document Revised: 06/30/2016 Document Reviewed: 05/31/2016 Elsevier Interactive Patient Education  [employer].    If you have lab work done today you will be contacted with your lab results within the next 2 weeks.  If you have not heard from [place] then please contact us. The fastest way to get your results is to register for My Chart.   IF you received an x-ray today, you will receive an invoice from [facility]. Please contact [facility] at [phone] with questions or concerns regarding your invoice.   IF you received labwork today, you will receive an invoice from [place]. Please contact [facility] at [phone] with questions or concerns regarding your invoice.   Our billing staff will not be able to assist you with questions regarding bills from these companies.  You will be contacted with the lab results as soon as they are available. The fastest way to get your results is to activate  your My Chart account. Instructions are located on the last page of this paperwork. If you have not heard from [place] regarding the results in 2 weeks, please contact this office.

## 2019-05-15 ENCOUNTER — Telehealth: Payer: Self-pay | Admitting: Family Medicine

## 2019-05-15 LAB — WET PREP FOR TRICH, YEAST, CLUE
Clue Cell Exam: NEGATIVE
Trichomonas Exam: NEGATIVE
Yeast Exam: NEGATIVE

## 2019-05-15 NOTE — Progress Notes (Signed)
This was in scheduling poole just anf fyi

## 2019-05-15 NOTE — Telephone Encounter (Signed)
Patient is calling because she is thinking that she needs a referral to [name] her OB/GYN- She spoke with [name]. However, she thinks that [name] is off. The patient would like to know [name] schedule an appt with [name] for her? Thank you.

## 2019-05-15 NOTE — Telephone Encounter (Signed)
Pt called in and was wanting to know if we sent a referral to [facility].GYN in for her . She stated that the assistant mentioned something about her needeng to go there, please advise FR pt can be reached at [phone]

## 2019-05-18 ENCOUNTER — Encounter: Payer: Self-pay | Admitting: Family Medicine

## 2019-05-22 NOTE — Telephone Encounter (Signed)
Dr. Nolon Rod will call pt an address concern.

## 2019-05-26 ENCOUNTER — Ambulatory Visit (INDEPENDENT_AMBULATORY_CARE_PROVIDER_SITE_OTHER): Payer: Medicare Other | Admitting: Podiatry

## 2019-05-26 ENCOUNTER — Encounter: Payer: Self-pay | Admitting: Podiatry

## 2019-05-26 ENCOUNTER — Other Ambulatory Visit: Payer: Self-pay

## 2019-05-26 VITALS — Temp 97.9°F

## 2019-05-26 DIAGNOSIS — M79672 Pain in left foot: Secondary | ICD-10-CM | POA: Diagnosis not present

## 2019-05-26 DIAGNOSIS — M79676 Pain in unspecified toe(s): Secondary | ICD-10-CM | POA: Diagnosis not present

## 2019-05-26 DIAGNOSIS — B351 Tinea unguium: Secondary | ICD-10-CM | POA: Diagnosis not present

## 2019-05-26 DIAGNOSIS — M79671 Pain in right foot: Secondary | ICD-10-CM | POA: Diagnosis not present

## 2019-05-26 DIAGNOSIS — L84 Corns and callosities: Secondary | ICD-10-CM

## 2019-05-26 NOTE — Patient Instructions (Signed)

## 2019-05-29 ENCOUNTER — Telehealth: Payer: Self-pay

## 2019-05-29 NOTE — Telephone Encounter (Signed)
Spoke with [facility] and requesting 05/14/2019 ov re: urinary frequency and they will contact pt to schedule appt.  Pt sees dr mody.  Called pt an advised she will get a call from dr mody office with appt date and time and then she can coordinate her transportation.  Pt agreeable.

## 2019-05-31 ENCOUNTER — Encounter: Payer: Self-pay | Admitting: Podiatry

## 2019-05-31 NOTE — Progress Notes (Signed)
Subjective: [name] presents to clinic today for follow-up of painful mycotic toenails and calluses of both feet.  Patient states calluses are painful when ambulating with and without shoe gear.  This pain limits her daily activities.    She voices no new pedal concerns on today's visit.  Forrest Moron, MD is her PCP.   Current Outpatient Medications:  .  Calcium Carbonate-Vitamin D (CALCIUM-VITAMIN D) 500-200 MG-UNIT tablet, Take 3 tablets by mouth every morning., Disp: , Rfl:  .  Elastic Bandages & Supports (LUMBAR BACK BRACE/SUPPORT PAD) MISC, Dispense one back brace to be worn while doing activities as needed daily, Disp: 1 each, Rfl: 1 .  ESTRACE VAGINAL 0.1 MG/GM vaginal cream, INSERT 1 APPLICATORFUL VAGINALLY 1 TIME A WEEK, Disp: 42.5 g, Rfl: 0 .  FEROSUL 325 (65 Fe) MG tablet, TK 1 T PO QD, Disp: , Rfl:  .  ferrous sulfate (SM IRON SLOW RELEASE) 160 (50 Fe) MG TBCR SR tablet, Take 1 tablet (160 mg total) by mouth daily., Disp: 30 each, Rfl: 6 .  gabapentin (NEURONTIN) 800 MG tablet, Take 1 tablet (800 mg total) by mouth at bedtime., Disp: 90 tablet, Rfl: 1 .  hydrochlorothiazide (MICROZIDE) 12.5 MG capsule, TAKE 1 CAPSULE BY MOUTH EVERY TUESDAY AND SATURDAY, Disp: 90 capsule, Rfl: 3 .  hydrocortisone 2.5 % ointment, APPLY EXTERNALLY TO THE AFFECTED AREA TWICE DAILY, Disp: 28.35 g, Rfl: 0 .  hydroxypropyl methylcellulose (ISOPTO TEARS) 2.5 % ophthalmic solution, Place 1 drop into both eyes 2 (two) times daily as needed for dry eyes. , Disp: , Rfl:  .  lactose free nutrition (BOOST) LIQD, Take 237 mLs by mouth 2 (two) times daily between meals. (Patient taking differently: Take 1 Container by mouth 2 (two) times daily as needed (nutritional). ), Disp: 60 Can, Rfl: 11 .  lidocaine (LIDODERM) 5 %, Place 1 patch onto the skin daily. Remove & Discard patch within 12 hours or as directed by MD.  Apply to the right side of your neck where it hurts, Disp: 30 patch, Rfl: 0 .  methocarbamol  (ROBAXIN) 500 MG tablet, Take 1 tablet (500 mg total) by mouth daily as needed for muscle spasms. After doing yard work., Disp: 10 tablet, Rfl: 1 .  metoprolol succinate (TOPROL-XL) 50 MG 24 hr tablet, take 1 tablet by mouth once daily WITH OR IMMEDIATELY FOLLOWING A MEAL, Disp: 90 tablet, Rfl: 3 .  nystatin cream (MYCOSTATIN), APP EXT AA BID FOR 10 DAYS PRN, Disp: , Rfl: 1 .  ofloxacin (OCUFLOX) 0.3 % ophthalmic solution, 10 drops in right ear once daily for 5 days., Disp: 5 mL, Rfl: 0 .  pantoprazole (PROTONIX) 40 MG tablet, Take 1 tablet (40 mg total) by mouth 2 (two) times daily., Disp: 180 tablet, Rfl: 1 .  promethazine (PHENERGAN) 12.5 MG tablet, take 1 tablet by mouth every 6 hours if needed for nausea and vomiting, Disp: 30 tablet, Rfl: 0 .  sucralfate (CARAFATE) 1 g tablet, Take 1 tablet (1 g total) by mouth every 6 (six) hours as needed. Slowly dissolve tablet in 1 Tablespoon of distilled water for 15 minutes before taking (do not crush), Disp: 360 tablet, Rfl: 0 .  traMADol-acetaminophen (ULTRACET) 37.5-325 MG tablet, Take 1 tablet by mouth every 6 (six) hours., Disp: , Rfl: 0 .  triamcinolone cream (KENALOG) 0.1 %, Apply topically 2 (two) times daily., Disp: 30 g, Rfl: 1 .  verapamil (CALAN-SR) 240 MG CR tablet, Take 1 tablet (240 mg total) by mouth  daily., Disp: 90 tablet, Rfl: 3 .  vitamin C (ASCORBIC ACID) 500 MG tablet, Take 500 mg by mouth daily., Disp: , Rfl:  .  ZINC OXIDE, TOPICAL, 10 % CREA, 1 application to affected area to prevent moisture and further irritation of skin., Disp: 50 g, Rfl: 0   No Known Allergies   Objective: Vitals:   05/26/19 1546  Temp: 97.9 F (36.6 C)    Physical Examination:  Vascular  Examination: Capillary refill time immediate x 10 digits.  Palpable DP/PT pulses b/l.  Digital hair absent bilaterally.  No edema noted b/l.  Skin temperature gradient WNL b/l.  Dermatological Examination: Skin with normal turgor, texture and tone  b/l.  No open wounds b/l.  No interdigital macerations noted b/l.  Elongated, thick, discolored brittle toenails with subungual debris and pain on dorsal palpation of nailbeds 1-5 b/l.  Hyperkeratotic lesions noted sub-central heel bilaterally and sub-met base of the fifth metatarsal bilaterally.  There is no erythema, no edema, no drainage, no flocculence noted.  Musculoskeletal Examination: Muscle strength 5/5 to all muscle groups b/l.  Bunion deformity noted bilaterally.  Overlapping hammertoe deformity bilateral second digits.  No pain, crepitus or joint discomfort with active/passive ROM.  Neurological Examination: Sensation intact 5/5 b/l with 10 gram monofilament.  Vibratory sensation intact b/l.  Proprioceptive sensation intact b/l.  Assessment: 1. Mycotic nail infection with pain 1-5 b/l 2. Painful calluses sub-central heel bilaterally and sub-fifth met base bilaterally  Plan: 1. Toenails 1-5 b/l were debrided in length and girth without iatrogenic laceration. 2. Painful calluses pared utilizing sterile scalpel blade without incident. 3. Continue soft, supportive shoe gear daily. 4. Report any pedal injuries to medical professional. 5. Follow up 3 months. 6. Patient/POA to call should there be a question/concern in there interim.

## 2019-06-05 DIAGNOSIS — R350 Frequency of micturition: Secondary | ICD-10-CM | POA: Diagnosis not present

## 2019-06-05 DIAGNOSIS — L292 Pruritus vulvae: Secondary | ICD-10-CM | POA: Diagnosis not present

## 2019-07-01 ENCOUNTER — Other Ambulatory Visit: Payer: Self-pay | Admitting: Family Medicine

## 2019-07-15 ENCOUNTER — Other Ambulatory Visit: Payer: Self-pay

## 2019-07-15 ENCOUNTER — Other Ambulatory Visit: Payer: Self-pay | Admitting: Family Medicine

## 2019-07-15 MED ORDER — SUCRALFATE 1 G PO TABS: 1.0000 g | ORAL_TABLET | Freq: Four times a day (QID) | ORAL | 1 refills | Status: DC | PRN

## 2019-07-16 ENCOUNTER — Ambulatory Visit (INDEPENDENT_AMBULATORY_CARE_PROVIDER_SITE_OTHER): Payer: Medicare Other | Admitting: Family Medicine

## 2019-07-16 ENCOUNTER — Other Ambulatory Visit: Payer: Self-pay

## 2019-07-16 ENCOUNTER — Encounter: Payer: Self-pay | Admitting: Family Medicine

## 2019-07-16 VITALS — BP 156/76 | HR 80 | Temp 98.9°F | Resp 17 | Ht <= 58 in | Wt 107.6 lb

## 2019-07-16 DIAGNOSIS — Z20828 Contact with and (suspected) exposure to other viral communicable diseases: Secondary | ICD-10-CM

## 2019-07-16 DIAGNOSIS — Z20822 Contact with and (suspected) exposure to covid-19: Secondary | ICD-10-CM

## 2019-07-16 DIAGNOSIS — I10 Essential (primary) hypertension: Secondary | ICD-10-CM

## 2019-07-16 DIAGNOSIS — R350 Frequency of micturition: Secondary | ICD-10-CM | POA: Diagnosis not present

## 2019-07-16 LAB — POCT URINALYSIS DIP (MANUAL ENTRY)
Bilirubin, UA: NEGATIVE
Blood, UA: NEGATIVE
Glucose, UA: NEGATIVE mg/dL
Ketones, POC UA: NEGATIVE mg/dL
Leukocytes, UA: NEGATIVE
Nitrite, UA: NEGATIVE
Protein Ur, POC: NEGATIVE mg/dL
Spec Grav, UA: 1.02 (ref 1.010–1.025)
Urobilinogen, UA: 0.2 E.U./dL
pH, UA: 6.5 (ref 5.0–8.0)

## 2019-07-16 NOTE — Patient Instructions (Addendum)
     If you have lab work done today you will be contacted with your lab results within the next 2 weeks.  If you have not heard from us then please contact us. The fastest way to get your results is to register for My Chart.   IF you received an x-ray today, you will receive an invoice from [facility]. Please contact [facility] at [phone] with questions or concerns regarding your invoice.   IF you received labwork today, you will receive an invoice from [facility]. Please contact [facility] at [phone] with questions or concerns regarding your invoice.   Our billing staff will not be able to assist you with questions regarding bills from these companies.  You will be contacted with the lab results as soon as they are available. The fastest way to get your results is to activate your My Chart account. Instructions are located on the last page of this paperwork. If you have not heard from us regarding the results in 2 weeks, please contact this office.    This information is directly available on the CDC website: https://www.cdc.gov/coronavirus/2019-ncov/if-you-are-sick/steps-when-sick.html    Source:CDC Reference to specific commercial products, manufacturers, companies, or trademarks does not constitute its endorsement or recommendation by the U.S. Government, Department of Health and Human Services, or Centers for Disease Control and Prevention.  

## 2019-07-16 NOTE — Progress Notes (Signed)
Established Patient Office Visit  Subjective:  Patient ID: [name], female    DOB: September 27, 1933  Age: 85 y.o. MRN: [id number]  CC:  Chief Complaint  Patient presents with  . Urinary Frequency    3 month f/u    HPI [name] presents for  Urinary frequency Patient continues to have vaginal itching She states that she uses estradiol and triamcinolone The itching is on the labia but not in the vagina  Hypertension: Patient here for follow-up of elevated blood pressure. She is exercising by sweeping her yard and is adherent to low salt diet.  Blood pressure is well controlled at home. Cardiac symptoms none. Patient denies chest pain, chest pressure/discomfort and exertional chest pressure/discomfort.  Cardiovascular risk factors: hypertension. Use of agents associated with hypertension: none. History of target organ damage: none. BP Readings from Last 3 Encounters:  07/16/19 (!) 156/76  05/14/19 128/68  04/08/19 138/63    She states that she does not use air conditioning  She states that she exercises by sweeping outside  She is concerned about covid Her caregiver is being tested for covid She denies fevers or chills, lack of taste or smell She is eating and drinking well   Past Medical History:  Diagnosis Date  . Abnormal finding on Pap smear, ASCUS   . Acute renal insufficiency   . Amputation finger    index  . Barrett's esophagus   . CAD (coronary artery disease)   . Dementia ([place])   . Diverticulosis   . Elevated BP   . Foot deformity   . GERD (gastroesophageal reflux disease)   . Glaucoma   . H/O: GI bleed   . HSV-2 (herpes simplex virus 2) infection   . Hyperglycemia   . Hypertension   . Migraine   . Osteoporosis    worsening  . Spondylosis     Past Surgical History:  Procedure Laterality Date  . AMPUTATION FINGER / THUMB     secondary to osteomyelitis  . CATARACT EXTRACTION, BILATERAL Bilateral 12/25/2016  . COLONOSCOPY    .  ESOPHAGOGASTRODUODENOSCOPY N/A 10/22/2016   Procedure: ESOPHAGOGASTRODUODENOSCOPY (EGD);  Surgeon: Doran Stabler, MD;  Location: [facility] ENDOSCOPY;  Service: Endoscopy;  Laterality: N/A;  . FOOT SURGERY     right  . PARTIAL HYSTERECTOMY    . UPPER GASTROINTESTINAL ENDOSCOPY      Family History  Problem Relation Age of Onset  . Breast cancer Mother   . Heart attack Mother   . Heart disease Father   . Alcohol abuse Son   . Colon cancer Neg Hx   . Stomach cancer Neg Hx   . Rectal cancer Neg Hx     Social History   Socioeconomic History  . Marital status: Single    Spouse name: Not on file  . Number of children: 1  . Years of education: Not on file  . Highest education level: 12th grade  Occupational History    Employer: RETIRED  Social Needs  . Financial resource strain: Not on file  . Food insecurity    Worry: Not on file    Inability: Not on file  . Transportation needs    Medical: Not on file    Non-medical: Not on file  Tobacco Use  . Smoking status: Never Smoker  . Smokeless tobacco: Never Used  Substance and Sexual Activity  . Alcohol use: No    Alcohol/week: 0.0 standard drinks  . Drug use: No  . Sexual activity: Never  Birth control/protection: Post-menopausal  Lifestyle  . Physical activity    Days per week: Not on file    Minutes per session: Not on file  . Stress: Not on file  Relationships  . Social Herbalist on phone: Not on file    Gets together: Not on file    Attends religious service: Not on file    Active member of club or organization: Not on file    Attends meetings of clubs or organizations: Not on file    Relationship status: Not on file  . Intimate partner violence    Fear of current or ex partner: Not on file    Emotionally abused: Not on file    Physically abused: Not on file    Forced sexual activity: Not on file  Other Topics Concern  . Not on file  Social History Narrative   Marital status: divorced.       Children: 1 son; no grandchildren      Lives: with son      Employment: retired      Tobacco: none      Alcohol: none      Drugs: none       Exercise: none       ADLs:  Independent with ADLs.  Quit driving age 60.  Medical CNA cleans house; makes breakfast; patient pays bills.      Advanced Directives:   YES.  HCPOA:  Son/Isaac Bowman.  FULL CODE; no prolonged measures.    Outpatient Medications Prior to Visit  Medication Sig Dispense Refill  . Calcium Carbonate-Vitamin D (CALCIUM-VITAMIN D) 500-200 MG-UNIT tablet Take 3 tablets by mouth every morning.    Water engineer Bandages & Supports (LUMBAR BACK BRACE/SUPPORT PAD) MISC Dispense one back brace to be worn while doing activities as needed daily 1 each 1  . ESTRACE VAGINAL 0.1 MG/GM vaginal cream INSERT 1 APPLICATORFUL VAGINALLY ONCE A WEEK 42.5 g 0  . FEROSUL 325 (65 Fe) MG tablet TK 1 T PO QD    . ferrous sulfate (SM IRON SLOW RELEASE) 160 (50 Fe) MG TBCR SR tablet Take 1 tablet (160 mg total) by mouth daily. 30 each 6  . gabapentin (NEURONTIN) 800 MG tablet TAKE 1 TABLET(800 MG) BY MOUTH AT BEDTIME 90 tablet 1  . hydrochlorothiazide (MICROZIDE) 12.5 MG capsule TAKE 1 CAPSULE BY MOUTH EVERY TUESDAY AND SATURDAY 90 capsule 3  . hydrocortisone 2.5 % ointment APPLY EXTERNALLY TO THE AFFECTED AREA TWICE DAILY 28.35 g 0  . hydroxypropyl methylcellulose (ISOPTO TEARS) 2.5 % ophthalmic solution Place 1 drop into both eyes 2 (two) times daily as needed for dry eyes.     Marland Kitchen lactose free nutrition (BOOST) LIQD Take 237 mLs by mouth 2 (two) times daily between meals. (Patient taking differently: Take 1 Container by mouth 2 (two) times daily as needed (nutritional). ) 60 Can 11  . lidocaine (LIDODERM) 5 % Place 1 patch onto the skin daily. Remove & Discard patch within 12 hours or as directed by MD.  Apply to the right side of your neck where it hurts 30 patch 0  . methocarbamol (ROBAXIN) 500 MG tablet Take 1 tablet (500 mg total) by mouth daily as needed  for muscle spasms. After doing yard work. 10 tablet 1  . metoprolol succinate (TOPROL-XL) 50 MG 24 hr tablet take 1 tablet by mouth once daily WITH OR IMMEDIATELY FOLLOWING A MEAL 90 tablet 3  . nystatin cream (MYCOSTATIN) APP EXT AA  BID FOR 10 DAYS PRN  1  . ofloxacin (OCUFLOX) 0.3 % ophthalmic solution 10 drops in right ear once daily for 5 days. 5 mL 0  . pantoprazole (PROTONIX) 40 MG tablet Take 1 tablet (40 mg total) by mouth 2 (two) times daily. 180 tablet 1  . promethazine (PHENERGAN) 12.5 MG tablet take 1 tablet by mouth every 6 hours if needed for nausea and vomiting 30 tablet 0  . sucralfate (CARAFATE) 1 g tablet Take 1 tablet (1 g total) by mouth every 6 (six) hours as needed. Slowly dissolve tablet in 1 Tablespoon of distilled water for 15 minutes before taking (do not crush) 120 tablet 1  . traMADol-acetaminophen (ULTRACET) 37.5-325 MG tablet Take 1 tablet by mouth every 6 (six) hours.  0  . triamcinolone cream (KENALOG) 0.1 % Apply topically 2 (two) times daily. 30 g 1  . verapamil (CALAN-SR) 240 MG CR tablet Take 1 tablet (240 mg total) by mouth daily. 90 tablet 3  . vitamin C (ASCORBIC ACID) 500 MG tablet Take 500 mg by mouth daily.    Marland Kitchen ZINC OXIDE, TOPICAL, 10 % CREA 1 application to affected area to prevent moisture and further irritation of skin. 50 g 0   No facility-administered medications prior to visit.     No Known Allergies  ROS Review of Systems Review of Systems  Constitutional: Negative for activity change, appetite change, chills and fever.  HENT: Negative for congestion, nosebleeds, trouble swallowing and voice change.   Respiratory: Negative for cough, shortness of breath and wheezing.   Gastrointestinal: Negative for diarrhea, nausea and vomiting.  Neurological: Negative for dizziness, speech difficulty, light-headedness and numbness.  See HPI. All other review of systems negative.     Objective:    Physical Exam  BP (!) 156/76 (BP Location: Right Arm,  Patient Position: Sitting, Cuff Size: Normal)   Pulse 80   Temp 98.9 F (37.2 C) (Oral)   Resp 17   Ht 4\' 10"  (1.473 m)   Wt 107 lb 9.6 oz (48.8 kg)   SpO2 96%   BMI 22.49 kg/m  Wt Readings from Last 3 Encounters:  07/16/19 107 lb 9.6 oz (48.8 kg)  05/14/19 113 lb (51.3 kg)  02/21/19 110 lb 12.8 oz (50.3 kg)   Physical Exam  Constitutional: Oriented to person, place, and time. Appears well-developed and well-nourished.  HENT:  Head: Normocephalic and atraumatic.  Eyes: Conjunctivae and EOM are normal.  Cardiovascular: Normal rate, regular rhythm, normal heart sounds and intact distal pulses.  No murmur heard. Pulmonary/Chest: Effort normal and breath sounds normal. No stridor. No respiratory distress. Has no wheezes.  Neurological: Is alert and oriented to person, place, and time.  Skin: Skin is warm. Capillary refill takes less than 2 seconds.  Psychiatric: Has a normal mood and affect. Behavior is normal. Judgment and thought content normal.    There are no preventive care reminders to display for this patient.  There are no preventive care reminders to display for this patient.  Lab Results  Component Value Date   TSH 1.060 01/01/2019   Lab Results  Component Value Date   WBC 4.9 02/04/2019   HGB 11.4 (L) 02/04/2019   HCT 35.2 (L) 02/04/2019   MCV 81.2 02/04/2019   PLT 402.0 (H) 02/04/2019   Lab Results  Component Value Date   NA 144 12/12/2018   K 4.4 12/12/2018   CO2 27 12/12/2018   GLUCOSE 77 12/12/2018   BUN 16 01/03/2019   CREATININE  0.95 12/12/2018   BILITOT <0.2 12/12/2018   ALKPHOS 56 12/12/2018   AST 13 12/12/2018   ALT 9 12/12/2018   PROT 6.6 12/12/2018   ALBUMIN 3.8 12/12/2018   CALCIUM 9.7 12/12/2018   ANIONGAP 7 10/22/2016   Lab Results  Component Value Date   CHOL 176 05/30/2017   Lab Results  Component Value Date   HDL 82 05/30/2017   Lab Results  Component Value Date   LDLCALC 80 05/30/2017   Lab Results  Component Value  Date   TRIG 70 05/30/2017   Lab Results  Component Value Date   CHOLHDL 2.1 05/30/2017   Lab Results  Component Value Date   HGBA1C 5.4 05/30/2017      Assessment & Plan:   Problem List Items Addressed This Visit      Cardiovascular and Mediastinum   Essential hypertension - Patient's blood pressure is at goal of 139/89 or less. Condition is stable. Continue current medications and treatment plan. I recommend that you exercise for 30-45 minutes 5 days a week. I also recommend a balanced diet with fruits and vegetables every day, lean meats, and little fried foods. The DASH diet (you can find this online) is a good example of this.     Other Visit Diagnoses    Urinary frequency    -  Primary Likely her continue vaginal atrophy   Relevant Orders   POCT urinalysis dipstick (Completed)    Exposure to Covid Pt's caregiver being tested Advised pt to wear mask when caregivers are present and gave pt a new mask   Hypertension - pt stressed about covid  Will not change meds No orders of the defined types were placed in this encounter.   Follow-up: No follow-ups on file.    Forrest Moron, MD

## 2019-07-18 ENCOUNTER — Ambulatory Visit: Payer: Medicare Other | Admitting: Family Medicine

## 2019-07-20 LAB — NOVEL CORONAVIRUS, NAA: SARS-CoV-2, NAA: DETECTED — AB

## 2019-07-20 LAB — SPECIMEN STATUS REPORT

## 2019-07-22 ENCOUNTER — Telehealth: Payer: Self-pay

## 2019-07-22 NOTE — Telephone Encounter (Signed)
Called pt advised she tested positive for Covid-19.  Pt says she has had a little sneezing but she works outside a lot, some headache but it feels its just from waking up and some sob but pt says that's cause she hasn't gotten up out of bed but feels once she gets to moving around this will go away too.  Per pt she feels fine and has been doing her regular daily activities.  Advised if she develops abd.pain, sneezing, cough, congestion, loss of smell and taste, diarrhea to contact our office immediately and to continue to wear her mask, self quarantine and social distance at all times.  Pt agreeable. Dgaddy, CMA

## 2019-07-23 ENCOUNTER — Other Ambulatory Visit: Payer: Self-pay

## 2019-07-23 MED ORDER — PANTOPRAZOLE SODIUM 40 MG PO TBEC: 40.0000 mg | DELAYED_RELEASE_TABLET | Freq: Two times a day (BID) | ORAL | 1 refills | Status: DC

## 2019-07-23 NOTE — Patient Outreach (Addendum)
[name]) Care Management  07/23/2019  Torria Fromer February 01, 1933 [id number]   Referral Date: 07/23/2019 Referral Source: MD Referral  Referral Reason: Needs food   Outreach Attempt: spoke with patient. She is able to verify HIPAA.  Patient states that she is recently tested positive for COVID-19 and she does not have food.  Patient rambles on the phone and does mention she has some dementia. Patient states she lost her son in September 2019 and she is still grieving her son.  Discussed [facility] services.  Patient agreeable to social work for food.    Social: Patient lives alone but has a niece that checks on her name Jacqulyn Cane [phone].  Patient states she is able to bath and dress herself and clean in her apartment but limited with fixing food.    Conditions: Patient has HTN, GERD and mentions some dementia.  Patient tested positive recently for COVID-19 but has really no symptoms. Patient states she rarely goes out of her home but states that her aide that she had from [place] would not wear a mask.  She states that she has been in contact with nurse [place] from the agency and aide has been taken from her case.  However, patient states she cannot get aide service back until she is clear of the virus.    Medications: Patient states she takes her medications as prescribed.     Appointments:  Patient saw PCP last week.  She states a friend takes her to appointments.   Advanced Directives: Patient does not have an advanced directive but states her niece handles her business for her.     Plan: RN CM will refer patient to social work for help with food.   RN CM will follow up with patient later this week.     Jone Baseman, RN, MSN [facility] Care Management Care Management Coordinator Direct Line [phone] Toll Free: [phone]  Fax: [phone]

## 2019-07-24 ENCOUNTER — Other Ambulatory Visit: Payer: Self-pay

## 2019-07-24 NOTE — Patient Outreach (Signed)
[place] [place] [facility]) Care Management  07/24/2019  [name] 02/12/1933 [id number]   Return call to patient.  She is able to verify HIPAA.  She states that she was just concerned about taking her trash out.  She states she was told not to go out.  Advised patient that she could go outside to take her trash but maintain 6 feet distance from others and wear her mask.  She verbalized understanding.  Patient rambles on call making it difficult to get information from patient.  She states that the social worker called her and will be getting her meals delivered.  Patient did not have follow up to get retested for COVID 19.  Assisted patient to call PCP.  Patient advised to go to [address] for re-test.  CM advised patient to make sure she goes ono or after 07-30-2019 for retesting.  She states she will call her ride and let them know.  Advised patient to make sure she wears a mask and her driver wears a mask too.  She verbalized understanding. Patient states she did talk with her niece on yesterday and advised her that she had COVID-19.  Patient states on call that she does have some shortness of breath but just came back in from outside taking her trash out.  Advised patient to rest and if shortness of breath continues to call her physician.  She verbalized understanding.  Advised patient that CM would call her again on Monday for follow up.  She verbalized understanding.    Plan: RN CM will outreach patient again on Monday and patient agreeable.    Jone Baseman, RN, MSN [place] Management Care Management Coordinator Direct Line [phone] Cell [phone] Toll Free: [phone]  Fax: [phone]

## 2019-07-24 NOTE — Patient Outreach (Signed)
[place] [facility]) Care Management  07/24/2019  Forest Pruden 1933/10/07 [id number]   High priority referral received on 07/23/19 from [facility], [employer].  "Please refer patient to social work for food. Patient positive for corona virus and in need of food. Patient lives alone with minimal support" Successful outreach to patient today.  BSW informed patient about [employer] which will allow her to receive two deliveries of 14 prepared meals.  BSW informed her that first delivery will be on Saturday, 07/26/19 and second delivery will be on Saturday, 08/09/19.   Patient confirmed that she has enough food in the home to last until Saturday.  She reports having things such as cereal and soup that will be easy for her to prepare.   BSW inquired about her ability to obtain food after no longer receiving delivered meals.  She reports that her niece, Jacqulyn Cane, assists with these needs.  Patient is not interested in being added to wait list for mobile meals program because she does not like the food.  Patient consented to [facility] contacting Ms. Hassell Done to inform her about meals being delivered.  Talked with Ms. Hassell Done today and informed her of above information. BSW is closing case but confirmed that patient and Ms. Hassell Done have my contact information in case additional needs arise.   Ronn Melena, BSW Social Worker [phone]

## 2019-07-25 ENCOUNTER — Ambulatory Visit: Payer: Medicare Other

## 2019-07-28 ENCOUNTER — Other Ambulatory Visit: Payer: Self-pay

## 2019-07-28 NOTE — Patient Outreach (Signed)
[facility]) Care Management  07/28/2019  [name] 1933/12/08 [id number]   Telephone call to patient for follow up.  She is able to verify HIPAA.  She states she did not get her food delivery on Saturday and wondered what happened to it.  Advised patient that I would have Amber the social worker to follow up. She verbalized understanding.  Patient states that she is doing ok otherwise.   Patient denies any fever, chills, cough or headache.  Discussed with patient things she has in the home to eat.  She states that she can make a peanut butter and jelly sandwich and she also has crackers in the home to eat.  She states that she will eat that this morning.  She also mentions having some chicken salad at her home as well.  She admits to being forgetful sometimes but writes things down to remember.  Patient states she will wait for Amber to return her call.   Plan: RN CM will contact patient later in the week for follow up.   Jone Baseman, RN, MSN [place] Management Care Management Coordinator Direct Line [phone] Cell [phone] Toll Free: [phone]  Fax: [phone]

## 2019-07-28 NOTE — Patient Outreach (Signed)
[facility]) Care Management  07/28/2019  Ethelene Closser 1933/06/16 [id number]   Incoming call from patient who reported that she did not receive delivered meals through [facility] on Saturday.  BSW communicated with intake staff with [employer] about this.  Unfortunately, there was a delay in processing so she will receive her first delivery on Wednesday. BSW called patient back to inform her of this.  Patient confirmed that she has food in the home such as soup, sandwich items, cereal, etc. Patient inquired about delivered meals once this program has ended as it is a temporary program.  BSW talked with her about a referral to [employer] Whe[employer] although she declined this during our initial conversation.  Patient consented to referral being submitted today.  BSW will submit referral but informed her that wait list is typically 6-8 months long.  Patient reported that her niece makes sure that she has food in the home.  Ronn Melena, BSW Social Worker [phone]

## 2019-07-29 ENCOUNTER — Telehealth: Payer: Self-pay | Admitting: Family Medicine

## 2019-07-29 NOTE — Telephone Encounter (Signed)
Pt is wanting to know when she can get home health back. When she can get hot home food delivery please call and let her know when she is negative. [phone] .

## 2019-07-29 NOTE — Telephone Encounter (Signed)
Patient would like for somebody to call her she has questions about her medication , covid test, and other things  [facility] called and is really worried about the patient and would like for her provider to call and help

## 2019-07-29 NOTE — Telephone Encounter (Signed)
I have called the pt back to answer some of the questions that she had. [name] has informed me that she is doing okay overall today, however she does have some questions about coming back to the office to get retested for COVID, and having some assistance for food. She is requesting to have moblie meals sent to her house and wanted to let the doctor know that she is going to start drinking 6 cups of water a day. I have informed the provider and she will call pt.   Thanks, [employer]

## 2019-07-29 NOTE — Telephone Encounter (Signed)
Dr Nolon Rod will contact pt and I will forward this message to her. Dgaddy, CMA

## 2019-07-29 NOTE — Telephone Encounter (Signed)
Spoke with [name]. Her home health aide had negative covid result.  She can return to clinic for retest anytime this week.  Will leave order in the computer. Please have patient come in for a nurse visit for follow up on covid. She remains symptomatic since her test on 07/16/2019.

## 2019-07-30 ENCOUNTER — Other Ambulatory Visit: Payer: Self-pay

## 2019-07-30 NOTE — Addendum Note (Signed)
Addended by: Delia Chimes A on: 07/30/2019 11:44 PM   Modules accepted: Orders

## 2019-07-30 NOTE — Patient Outreach (Signed)
[place] [facility]) Care Management  07/30/2019  Santiaga Butzin 10/02/1933 [id number]   Patient confirmed receipt of meals delivered today from [facility].  Ronn Melena, [place] Social Worker [phone]

## 2019-07-31 ENCOUNTER — Other Ambulatory Visit: Payer: Self-pay

## 2019-07-31 ENCOUNTER — Ambulatory Visit: Payer: Medicare Other

## 2019-07-31 ENCOUNTER — Other Ambulatory Visit: Payer: Self-pay | Admitting: Family Medicine

## 2019-07-31 DIAGNOSIS — D539 Nutritional anemia, unspecified: Secondary | ICD-10-CM

## 2019-07-31 NOTE — Patient Outreach (Addendum)
[facility] [facility]) Care Management  07/31/2019  [name] [birth year] [id number]   Telephone call to patient for follow up.  Patient states that she has an appointment with her Doctor on Monday 08/11/2019 and she needs help with transportation.  Advised patient that CM would have Amber call her to assist. She verbalized understanding.  Patient reports that she will have repeat COVID-19 testing on her visit.  Patient denies any symptoms at this time but states she does get a headache sometimes.  Discussed signs of worsening COVID-19 with her.  She verbalized understanding.      Plan: RN CM will contact next week for follow up.    Jone Baseman, RN, MSN [place] Management Care Management Coordinator Direct Line [phone] Cell [phone] Toll Free: [phone]  Fax: [phone]

## 2019-07-31 NOTE — Telephone Encounter (Signed)
Spoke with pt advised repeat covid-19 test order in. Pt has nurse visit for repeat covid 19 on 08/11/19.  Pt agreeable.

## 2019-08-04 ENCOUNTER — Other Ambulatory Visit: Payer: Self-pay

## 2019-08-04 NOTE — Patient Outreach (Signed)
[place] [facility]) Care Management  08/04/2019  [name] [birth year] [id number]   Telephone call to patient for follow up. She states she is doing ok.  She states she has a headache off and on but otherwise ok.  She was wanting to cancel her second set of meal delivery.  She states she has their number and will call today.  She states she has plenty of food now and does not want to waste the meals.  Patient asked what she should do for her GYN appointment.  Advised patient to call and reschedule the appointment being that she does have the virus.  Patient to be retested on 08-11-2019 at her doctors office.  Discussed with patient do's and don'ts with the virus and advised patient on mask wearing. She verbalized understanding.    Plan: RN CM will contact patient next week for follow up and patient agrees to next outreach.    Jone Baseman, RN, MSN [place] Management Care Management Coordinator Direct Line [phone] Cell [phone] Toll Free: [phone]  Fax: [phone]

## 2019-08-04 NOTE — Patient Outreach (Signed)
[place] [facility]) Care Management  08/04/2019  [name] 1933-07-05 [id number]     In-basket message from Jon Billings on 07/31/19 to contact patient regarding transportation resources.  Patient left voicemail message on Saturday, 08/02/19, regarding [employer]. Outreach to patient today upon return from [facility].  Patient reports that she has plenty of food at this point and does not want to receive the second delivery from [employer].  Patient spoke with representative from [place] this morning.  BSW also sent message to intake requesting that meal delivery be cancelled. BSW and patient discussed transportation resources.  Patient is certified for [place] transportation but stated that she does not like to use it because "they are not on time" BSW informed her that this is the only resource available for non-medical transport in [place].  BSW provided her with contact information for Medicaid transportation and informed her that this can be used for transportation to MD appointments.    Ronn Melena, BSW Social Worker [phone]

## 2019-08-11 ENCOUNTER — Other Ambulatory Visit: Payer: Self-pay

## 2019-08-11 ENCOUNTER — Telehealth: Payer: Self-pay | Admitting: Family Medicine

## 2019-08-11 ENCOUNTER — Ambulatory Visit: Payer: Medicare Other

## 2019-08-11 DIAGNOSIS — Z20828 Contact with and (suspected) exposure to other viral communicable diseases: Secondary | ICD-10-CM

## 2019-08-11 DIAGNOSIS — Z20822 Contact with and (suspected) exposure to covid-19: Secondary | ICD-10-CM

## 2019-08-11 NOTE — Telephone Encounter (Signed)
Patient requesting call back from CMA to discuss whether she is able to take "nerve pill" before appointment today. Please advise.

## 2019-08-11 NOTE — Telephone Encounter (Signed)
Advised pt to bring in medication she is taking and ok to take it.  Pt wants to know can she eat something I advised it is ok to eat we are justing swabbing her for covid. Pt agreeable and will be here at 2:30 pm for her 3:00 appt.

## 2019-08-12 LAB — NOVEL CORONAVIRUS, NAA: SARS-CoV-2, NAA: NOT DETECTED

## 2019-08-13 ENCOUNTER — Other Ambulatory Visit: Payer: Self-pay

## 2019-08-13 NOTE — Patient Outreach (Signed)
[place] [facility]) Care Management  08/13/2019  Brande Uncapher 1933-11-10 [id number]   Telephone call to patient for follow up.  Patient reports she is doing good.  She denies any sick feeling and is able to do her usual things.  She states she had another COVID test on Monday and just waiting for results.  She states her aide is waiting as well. Patient has food and is eating regularly and taking her medications as prescribed.  She denies any new concerns at this time just ready to get her results back.    Plan: RN CM will outreach patient again next week.  Jone Baseman, RN, MSN [place] Management Care Management Coordinator Direct Line [phone] Cell [phone] Toll Free: [phone]  Fax: [phone]

## 2019-08-18 ENCOUNTER — Telehealth: Payer: Self-pay

## 2019-08-18 ENCOUNTER — Telehealth: Payer: Self-pay | Admitting: Family Medicine

## 2019-08-18 NOTE — Telephone Encounter (Signed)
Spoke with [place] advised we will need a copy of release of information signed by pt in order to send over her lab results.  Agreeable and will send ROI copy to [place] at [phone].

## 2019-08-18 NOTE — Telephone Encounter (Signed)
Spoke with pt an advised will send test results via mail to home address.  Pt agreeable. Dgaddy, CMA

## 2019-08-18 NOTE — Telephone Encounter (Signed)
Pt. Called given, given COVID 19 results. Verbalizes understanding. Requests [facility] be notified so she can get her aide back - contact number [phone]. Please advise.

## 2019-08-18 NOTE — Telephone Encounter (Signed)
Pt needs covid result faxed to [id number] so she can resume [facility] services.  CB is [place] [id number] to verify.

## 2019-08-18 NOTE — Telephone Encounter (Signed)
Pt. Request a copy of COVID 19 be mailed to her.

## 2019-08-18 NOTE — Telephone Encounter (Signed)
Pt would like a copy of her results sent to her mailing address.

## 2019-08-20 ENCOUNTER — Other Ambulatory Visit: Payer: Self-pay

## 2019-08-20 NOTE — Patient Outreach (Signed)
[place] [facility]) Care Management  08/20/2019  [name] 07/27/1933 [id number]   Telephone call to patient.  She is able to verify HIPAA. She states that her COVID19 retest was negative. She states that her aide is coming back she is just waiting on paperwork.  Patient still has meals to eat.  Advised patient that today would be last call.  She verbalized understanding and denies any other needs at this time.    Plan: RN CM will close case.  Jone Baseman, RN, MSN [place] Management Care Management Coordinator Direct Line [phone] Cell [phone] Toll Free: [phone]  Fax: [phone]

## 2019-08-25 ENCOUNTER — Telehealth: Payer: Self-pay | Admitting: Family Medicine

## 2019-08-25 NOTE — Telephone Encounter (Signed)
Pt had some questions about mask wearing and would like a call back at [phone]

## 2019-08-26 DIAGNOSIS — H401122 Primary open-angle glaucoma, left eye, moderate stage: Secondary | ICD-10-CM | POA: Diagnosis not present

## 2019-08-26 DIAGNOSIS — H401111 Primary open-angle glaucoma, right eye, mild stage: Secondary | ICD-10-CM | POA: Diagnosis not present

## 2019-08-26 DIAGNOSIS — H04123 Dry eye syndrome of bilateral lacrimal glands: Secondary | ICD-10-CM | POA: Diagnosis not present

## 2019-08-26 DIAGNOSIS — Z961 Presence of intraocular lens: Secondary | ICD-10-CM | POA: Diagnosis not present

## 2019-08-26 DIAGNOSIS — Z23 Encounter for immunization: Secondary | ICD-10-CM | POA: Diagnosis not present

## 2019-08-26 NOTE — Telephone Encounter (Signed)
Left message on voicemail to return office call. Dgaddy, CMA 

## 2019-08-27 ENCOUNTER — Encounter: Payer: Self-pay | Admitting: Podiatry

## 2019-08-27 ENCOUNTER — Other Ambulatory Visit: Payer: Self-pay

## 2019-08-27 ENCOUNTER — Ambulatory Visit (INDEPENDENT_AMBULATORY_CARE_PROVIDER_SITE_OTHER): Payer: Medicare Other | Admitting: Podiatry

## 2019-08-27 ENCOUNTER — Telehealth: Payer: Self-pay

## 2019-08-27 DIAGNOSIS — M79671 Pain in right foot: Secondary | ICD-10-CM | POA: Diagnosis not present

## 2019-08-27 DIAGNOSIS — B351 Tinea unguium: Secondary | ICD-10-CM

## 2019-08-27 DIAGNOSIS — L84 Corns and callosities: Secondary | ICD-10-CM

## 2019-08-27 DIAGNOSIS — M79676 Pain in unspecified toe(s): Secondary | ICD-10-CM

## 2019-08-27 DIAGNOSIS — M79672 Pain in left foot: Secondary | ICD-10-CM

## 2019-08-27 NOTE — Patient Instructions (Signed)

## 2019-08-27 NOTE — Telephone Encounter (Signed)
Spoke with pt and mask questions answered-see telephone note 08/27/2019. Dgaddy, CMA

## 2019-08-27 NOTE — Telephone Encounter (Signed)
Called pt re: telephone advice record sent over to office advising pt was suicidal and threatening to harm herself and others.  "Caller states that they are calling about having a medication that she is to put in her right eye everyday.  She states that she needs to know how to use it.  She said that she wishes she was dead and she lost her son recently and does not want to live."  Pt advises she never said she wish she was dead but did say she doesn't want to live but was just talking as she was frustrated and upset that she didn't know how to use the new medication given by eye doctor.  Advised pt to not say things like she doesn't want to live and not mean it as this can cause her some unwanted problems.  Pt promises not to say this again because she is not suicidal and she does want to leave.  Pt wanted to know date of next appt-info given and advised to make sure she is wearing her mask outside and around others and to make sure her aid is masking properly and using good hand hygiene.  Pt agreeable. Dgaddy, CMA

## 2019-09-03 NOTE — Progress Notes (Signed)
Subjective:  [name] presents to clinic today with cc of  painful, thick, discolored, elongated toenails 1-5 b/l and calluses that become tender and cannot cut because of thickness. Pain is aggravated when wearing enclosed shoe gear.  Forrest Moron, MD is her PCP.  She voices no new pedal problems on today's visit.   Current Outpatient Medications:  .  Calcium Carbonate-Vitamin D (CALCIUM-VITAMIN D) 500-200 MG-UNIT tablet, Take 3 tablets by mouth every morning., Disp: , Rfl:  .  Elastic Bandages & Supports (LUMBAR BACK BRACE/SUPPORT PAD) MISC, Dispense one back brace to be worn while doing activities as needed daily, Disp: 1 each, Rfl: 1 .  ESTRACE VAGINAL 0.1 MG/GM vaginal cream, INSERT 1 APPLICATORFUL VAGINALLY ONCE A WEEK, Disp: 42.5 g, Rfl: 0 .  FEROSUL 325 (65 Fe) MG tablet, TK 1 T PO QD, Disp: , Rfl:  .  FEROSUL 325 (65 Fe) MG tablet, TAKE 1 TABLET BY MOUTH EVERY DAY, Disp: 90 tablet, Rfl: 0 .  gabapentin (NEURONTIN) 800 MG tablet, TAKE 1 TABLET(800 MG) BY MOUTH AT BEDTIME, Disp: 90 tablet, Rfl: 1 .  hydrochlorothiazide (MICROZIDE) 12.5 MG capsule, TAKE 1 CAPSULE BY MOUTH EVERY TUESDAY AND SATURDAY, Disp: 90 capsule, Rfl: 3 .  hydrocortisone 2.5 % ointment, APPLY EXTERNALLY TO THE AFFECTED AREA TWICE DAILY, Disp: 28.35 g, Rfl: 0 .  hydroxypropyl methylcellulose (ISOPTO TEARS) 2.5 % ophthalmic solution, Place 1 drop into both eyes 2 (two) times daily as needed for dry eyes. , Disp: , Rfl:  .  lactose free nutrition (BOOST) LIQD, Take 237 mLs by mouth 2 (two) times daily between meals. (Patient taking differently: Take 1 Container by mouth 2 (two) times daily as needed (nutritional). ), Disp: 60 Can, Rfl: 11 .  lidocaine (LIDODERM) 5 %, Place 1 patch onto the skin daily. Remove & Discard patch within 12 hours or as directed by MD.  Apply to the right side of your neck where it hurts, Disp: 30 patch, Rfl: 0 .  methocarbamol (ROBAXIN) 500 MG tablet, Take 1 tablet (500 mg total) by mouth  daily as needed for muscle spasms. After doing yard work., Disp: 10 tablet, Rfl: 1 .  metoprolol succinate (TOPROL-XL) 50 MG 24 hr tablet, take 1 tablet by mouth once daily WITH OR IMMEDIATELY FOLLOWING A MEAL, Disp: 90 tablet, Rfl: 3 .  nystatin cream (MYCOSTATIN), APP EXT AA BID FOR 10 DAYS PRN, Disp: , Rfl: 1 .  ofloxacin (OCUFLOX) 0.3 % ophthalmic solution, 10 drops in right ear once daily for 5 days., Disp: 5 mL, Rfl: 0 .  pantoprazole (PROTONIX) 40 MG tablet, Take 1 tablet (40 mg total) by mouth 2 (two) times daily., Disp: 180 tablet, Rfl: 1 .  promethazine (PHENERGAN) 12.5 MG tablet, take 1 tablet by mouth every 6 hours if needed for nausea and vomiting, Disp: 30 tablet, Rfl: 0 .  sucralfate (CARAFATE) 1 g tablet, Take 1 tablet (1 g total) by mouth every 6 (six) hours as needed. Slowly dissolve tablet in 1 Tablespoon of distilled water for 15 minutes before taking (do not crush), Disp: 120 tablet, Rfl: 1 .  traMADol-acetaminophen (ULTRACET) 37.5-325 MG tablet, Take 1 tablet by mouth every 6 (six) hours., Disp: , Rfl: 0 .  triamcinolone cream (KENALOG) 0.1 %, Apply topically 2 (two) times daily., Disp: 30 g, Rfl: 1 .  verapamil (CALAN-SR) 240 MG CR tablet, Take 1 tablet (240 mg total) by mouth daily., Disp: 90 tablet, Rfl: 3 .  vitamin C (ASCORBIC ACID) 500 MG  tablet, Take 500 mg by mouth daily., Disp: , Rfl:  .  ZINC OXIDE, TOPICAL, 10 % CREA, 1 application to affected area to prevent moisture and further irritation of skin., Disp: 50 g, Rfl: 0   No Known Allergies   Objective: Physical Examination:  Vascular Examination: Capillary refill time immediate x 10 digits.  Palpable DP/PT pulses b/l.  Digital hair absent b/l.  No edema noted b/l.  Skin temperature gradient WNL b/l.  Dermatological Examination: Skin with normal turgor, texture and tone b/l.  No open wounds b/l.  No interdigital macerations noted b/l.  Elongated, thick, discolored brittle toenails with subungual  debris and pain on dorsal palpation of nailbeds 1-5 b/l.  Hyperkeratotic lesions sub 5th met base b/l, subheel b/l with tenderness to palpation. No edema, no erythema, no drainage, no flocculence.  Musculoskeletal Examination: Muscle strength 5/5 to all muscle groups b/l.  HAV with bunion b/l.   Overlapping hammertoe deformity b/l 2nd digits.  No pain, crepitus or joint discomfort with active/passive ROM.  Neurological Examination: Sensation intact 5/5 b/l with 10 gram monofilament.  Vibratory sensation intact b/l.  Proprioceptive sensation intact b/l.  Assessment: Mycotic nail infection with pain 1-5 b/l Calluses sub 5th met base b/l, subheel b/l Pain in feet  Plan: 1. Toenails 1-5 b/l were debrided in length and girth without iatrogenic laceration.  2. Calluses pared sub 5th met base b/l, subheel b/l utilizing sterile scalpel blade without incident. 3.  Continue soft, supportive shoe gear daily. 4. Report any pedal injuries to medical professional. 5. Follow up 3 months. 6. Patient/POA to call should there be a question/concern in there interim.

## 2019-09-22 ENCOUNTER — Other Ambulatory Visit: Payer: Self-pay | Admitting: Family Medicine

## 2019-09-22 ENCOUNTER — Telehealth: Payer: Self-pay | Admitting: Family Medicine

## 2019-09-22 NOTE — Telephone Encounter (Signed)
Pt says that she had her flu shot at the [facility] on 08/26/19.

## 2019-09-22 NOTE — Telephone Encounter (Signed)
Pt is requesting a refill for traMADol-acetaminophen (ULTRACET) 37.5-325 MG tablet     Pharmacy:  [facility] [place], [address] AT [address] [phone] (Phone) [phone] (Fax)

## 2019-09-23 NOTE — Telephone Encounter (Signed)
Request sent to provider for review and fill. Dgaddy, CMA

## 2019-09-23 NOTE — Telephone Encounter (Signed)
Noted and will be notated historically under immunization tab in Epic.

## 2019-09-24 MED ORDER — TRAMADOL-ACETAMINOPHEN 37.5-325 MG PO TABS: 1.0000 | ORAL_TABLET | Freq: Four times a day (QID) | ORAL | 0 refills | Status: DC

## 2019-10-01 ENCOUNTER — Telehealth: Payer: Self-pay | Admitting: Family Medicine

## 2019-10-01 NOTE — Telephone Encounter (Signed)
Copied from [place] [phone]. Topic: Referral - Request for Referral >> Oct 01, 2019 10:50 AM Erick Blinks wrote: Has patient seen PCP for this complaint? Yes.   *If NO, is insurance requiring patient see PCP for this issue before PCP can refer them? Referral for which specialty: Spine Specialist  Preferred provider/office: Hig[facility] recommended  Reason for referral: Back pain

## 2019-10-01 NOTE — Telephone Encounter (Signed)
Copied from [place] [phone]. Topic: General - Call Back - No Documentation >> Oct 01, 2019 10:55 AM Erick Blinks wrote: Reason for CRM: Pt is requesting call back to discuss Booster vaccine. Please advise  Best contact: [phone]

## 2019-10-01 NOTE — Telephone Encounter (Signed)
Tried contacting pt via phone -no answer and unable to leave message.

## 2019-10-02 NOTE — Telephone Encounter (Signed)
Spoke with pt advised she needed her 2nd shingrix vaccine and she can receive this at her local pharmacy.  Pt advises she will go to pharmacy for vaccine.  Pt advises she received her flu shot at [facility].

## 2019-10-08 DIAGNOSIS — N952 Postmenopausal atrophic vaginitis: Secondary | ICD-10-CM | POA: Diagnosis not present

## 2019-10-08 DIAGNOSIS — Z779 Other contact with and (suspected) exposures hazardous to health: Secondary | ICD-10-CM | POA: Diagnosis not present

## 2019-10-08 DIAGNOSIS — Z1231 Encounter for screening mammogram for malignant neoplasm of breast: Secondary | ICD-10-CM | POA: Diagnosis not present

## 2019-10-08 DIAGNOSIS — L292 Pruritus vulvae: Secondary | ICD-10-CM | POA: Diagnosis not present

## 2019-10-10 ENCOUNTER — Telehealth: Payer: Self-pay | Admitting: Family Medicine

## 2019-10-10 NOTE — Telephone Encounter (Signed)
Pt would like an order for a bone density scan.

## 2019-10-15 ENCOUNTER — Ambulatory Visit: Payer: Medicare Other | Admitting: Family Medicine

## 2019-10-20 DIAGNOSIS — M8589 Other specified disorders of bone density and structure, multiple sites: Secondary | ICD-10-CM | POA: Diagnosis not present

## 2019-10-20 DIAGNOSIS — R29890 Loss of height: Secondary | ICD-10-CM | POA: Diagnosis not present

## 2019-10-29 NOTE — Telephone Encounter (Signed)
Spoke with pt and she informed me that she just would like  a copy sent to her home of her Bone scan.

## 2019-10-30 ENCOUNTER — Other Ambulatory Visit: Payer: Self-pay | Admitting: Family Medicine

## 2019-10-30 DIAGNOSIS — D539 Nutritional anemia, unspecified: Secondary | ICD-10-CM

## 2019-10-30 MED ORDER — FERROUS SULFATE 325 (65 FE) MG PO TABS: 325.0000 mg | ORAL_TABLET | Freq: Every day | ORAL | 0 refills | Status: DC

## 2019-10-30 NOTE — Telephone Encounter (Signed)
Requested medication (s) are due for refill today: yes  Requested medication (s) are on the active medication list:  yes  Last refill:  09/22/2019  Future visit scheduled: yes  Notes to clinic:  Refill cannot be delegated  Review for refill   Requested Prescriptions  Pending Prescriptions Disp Refills   nystatin cream (MYCOSTATIN) 30 g 1     Off-Protocol Failed - 10/30/2019 11:10 AM      Failed - Medication not assigned to a protocol, review manually.      Passed - Valid encounter within last 12 months    Recent Outpatient Visits          3 months ago Urinary frequency   Primary Care at [facility], Arlie Solomons, MD   5 months ago Urinary frequency   Primary Care at [facility], Ranell Patrick, MD   5 months ago Chronic gastric ulcer without hemorrhage and without perforation   Primary Care at [facility], Arlie Solomons, MD   6 months ago Nausea   Primary Care at [facility], Arlie Solomons, MD   6 months ago Essential hypertension   Primary Care at [place], MD      Future Appointments            In 4 days Forrest Moron, MD Primary Care at [facility], [facility]            traMADol-acetaminophen (ULTRACET) 37.5-325 MG tablet 30 tablet 0    Sig: Take 1 tablet by mouth every 6 (six) hours.     Not Delegated - Analgesics:  Opioid Agonist Combinations Failed - 10/30/2019 11:10 AM      Failed - This refill cannot be delegated      Failed - Urine Drug Screen completed in last 360 days.      Passed - Valid encounter within last 6 months    Recent Outpatient Visits          3 months ago Urinary frequency   Primary Care at [facility], Arlie Solomons, MD   5 months ago Urinary frequency   Primary Care at [facility], Ranell Patrick, MD   5 months ago Chronic gastric ulcer without hemorrhage and without perforation   Primary Care at [facility], Arlie Solomons, MD   6 months ago Nausea   Primary Care at [facility], Arlie Solomons, MD   6 months ago Essential hypertension   Primary  Care at [place], MD      Future Appointments            In 4 days Forrest Moron, MD Primary Care at [place], [facility]           Signed Prescriptions Disp Refills   ferrous sulfate (FEROSUL) 325 (65 FE) MG tablet 90 tablet 0    Sig: Take 1 tablet (325 mg total) by mouth daily.     Endocrinology:  Minerals - Iron Supplementation Failed - 10/30/2019 11:10 AM      Failed - HGB in normal range and within 360 days    Hemoglobin  Date Value Ref Range Status  02/04/2019 11.4 (L) 12.0 - 15.0 g/dL Final  04/15/2018 10.8 (L) 11.1 - 15.9 g/dL Final         Failed - HCT in normal range and within 360 days    HCT  Date Value Ref Range Status  02/04/2019 35.2 (L) 36.0 - 46.0 % Final   Hematocrit  Date Value Ref Range Status  04/15/2018 34.5 34.0 - 46.6 % Final         Passed - RBC in normal range and within 360 days    RBC  Date Value Ref Range Status  02/04/2019 4.33 3.87 - 5.11 Mil/uL Final         Passed - Fe (serum) in normal range and within 360 days    Iron  Date Value Ref Range Status  01/01/2019 127 27 - 139 ug/dL Final   Iron Saturation  Date Value Ref Range Status  01/01/2019 39 15 - 55 % Final         Passed - Ferritin in normal range and within 360 days    Ferritin  Date Value Ref Range Status  01/01/2019 23 15 - 150 ng/mL Final         Passed - Valid encounter within last 12 months    Recent Outpatient Visits          3 months ago Urinary frequency   Primary Care at [facility], Arlie Solomons, MD   5 months ago Urinary frequency   Primary Care at [facility], Ranell Patrick, MD   5 months ago Chronic gastric ulcer without hemorrhage and without perforation   Primary Care at [facility], Arlie Solomons, MD   6 months ago Nausea   Primary Care at [facility], Arlie Solomons, MD   6 months ago Essential hypertension   Primary Care at [place], MD      Future Appointments            In 4 days Forrest Moron, MD Primary Care at [place],  [facility]

## 2019-10-30 NOTE — Telephone Encounter (Signed)
Copied from [place] [id number]. Topic: Quick Communication - Rx Refill/Question >> Oct 30, 2019 11:05 AM Leward Quan A wrote: Medication: nystatin cream (MYCOSTATIN), traMADol-acetaminophen (ULTRACET) 37.5-325 MG tablet, FEROSUL 325 (65 Fe) MG tablet   Has the patient contacted their pharmacy? Yes.   (Agent: If no, request that the patient contact the pharmacy for the refill.) (Agent: If yes, when and what did the pharmacy advise?)  Preferred Pharmacy (with phone number or street name): [facility] [phone] - [place], [place] - [address] AT [address] [phone] (Phone) [phone] (Fax)    Agent: Please be advised that RX refills may take up to 3 business days. We ask that you follow-up with your pharmacy.

## 2019-11-03 ENCOUNTER — Other Ambulatory Visit: Payer: Self-pay

## 2019-11-03 ENCOUNTER — Ambulatory Visit (INDEPENDENT_AMBULATORY_CARE_PROVIDER_SITE_OTHER): Payer: Medicare Other | Admitting: Family Medicine

## 2019-11-03 ENCOUNTER — Encounter: Payer: Self-pay | Admitting: Family Medicine

## 2019-11-03 VITALS — BP 136/56 | HR 80 | Temp 98.5°F | Resp 12 | Ht <= 58 in | Wt 116.0 lb

## 2019-11-03 DIAGNOSIS — D508 Other iron deficiency anemias: Secondary | ICD-10-CM

## 2019-11-03 DIAGNOSIS — I2510 Atherosclerotic heart disease of native coronary artery without angina pectoris: Secondary | ICD-10-CM | POA: Diagnosis not present

## 2019-11-03 DIAGNOSIS — M479 Spondylosis, unspecified: Secondary | ICD-10-CM | POA: Diagnosis not present

## 2019-11-03 DIAGNOSIS — D539 Nutritional anemia, unspecified: Secondary | ICD-10-CM | POA: Diagnosis not present

## 2019-11-03 DIAGNOSIS — M5136 Other intervertebral disc degeneration, lumbar region: Secondary | ICD-10-CM | POA: Diagnosis not present

## 2019-11-03 DIAGNOSIS — I10 Essential (primary) hypertension: Secondary | ICD-10-CM

## 2019-11-03 MED ORDER — METHOCARBAMOL 500 MG PO TABS: 500.0000 mg | ORAL_TABLET | Freq: Every day | ORAL | 1 refills | Status: DC | PRN

## 2019-11-03 MED ORDER — HYDROCHLOROTHIAZIDE 12.5 MG PO CAPS: ORAL_CAPSULE | ORAL | 3 refills | Status: DC

## 2019-11-03 MED ORDER — TRAMADOL-ACETAMINOPHEN 37.5-325 MG PO TABS: 1.0000 | ORAL_TABLET | Freq: Four times a day (QID) | ORAL | 0 refills | Status: DC

## 2019-11-03 MED ORDER — FERROUS SULFATE 325 (65 FE) MG PO TABS: 325.0000 mg | ORAL_TABLET | Freq: Every day | ORAL | 0 refills | Status: DC

## 2019-11-03 MED ORDER — PROMETHAZINE HCL 12.5 MG PO TABS: ORAL_TABLET | ORAL | 6 refills | Status: DC

## 2019-11-03 MED ORDER — LUMBAR BACK BRACE/SUPPORT PAD MISC: 1 refills | Status: DC

## 2019-11-03 MED ORDER — VERAPAMIL HCL ER 240 MG PO TBCR: 240.0000 mg | EXTENDED_RELEASE_TABLET | Freq: Every day | ORAL | 3 refills | Status: DC

## 2019-11-03 MED ORDER — PANTOPRAZOLE SODIUM 40 MG PO TBEC: 40.0000 mg | DELAYED_RELEASE_TABLET | Freq: Two times a day (BID) | ORAL | 1 refills | Status: DC

## 2019-11-03 MED ORDER — METOPROLOL SUCCINATE ER 50 MG PO TB24: ORAL_TABLET | ORAL | 3 refills | Status: DC

## 2019-11-03 NOTE — Patient Instructions (Addendum)
° ° ° °  If you have lab work done today you will be contacted with your lab results within the next 2 weeks.  If you have not heard from us then please contact us. The fastest way to get your results is to register for My Chart. ° ° °IF you received an x-ray today, you will receive an invoice from [facility]. Please contact [facility] at [phone] with questions or concerns regarding your invoice.  ° °IF you received labwork today, you will receive an invoice from [facility]. Please contact [facility] at [phone] with questions or concerns regarding your invoice.  ° °Our billing staff will not be able to assist you with questions regarding bills from these companies. ° °You will be contacted with the lab results as soon as they are available. The fastest way to get your results is to activate your My Chart account. Instructions are located on the last page of this paperwork. If you have not heard from us regarding the results in 2 weeks, please contact this office. °  ° ° ° °

## 2019-11-03 NOTE — Progress Notes (Signed)
Established Patient Office Visit  Subjective:  Patient ID: [name], female    DOB: 1932/12/29  Age: 85 y.o. MRN: [id number]  CC:  Chief Complaint  Patient presents with  . Follow-up    medical condition  . Rash    pt stated having red rash Lt lower leg/foot-1 week    HPI [name] presents for   Patient reports that she has been    Hypertension: Patient here for follow-up of elevated blood pressure. She is exercising by doing house work and is adherent to low salt diet.  Blood pressure is well controlled at home. Cardiac symptoms none. Patient denies chest pain, claudication, exertional chest pressure/discomfort, fatigue, irregular heart beat and near-syncope.  Cardiovascular risk factors: hypertension. Use of agents associated with hypertension: none. History of target organ damage: none. BP Readings from Last 3 Encounters:  11/03/19 (!) 136/56  07/16/19 (!) 156/76  05/14/19 128/68    Wt Readings from Last 3 Encounters:  11/03/19 116 lb (52.6 kg)  07/16/19 107 lb 9.6 oz (48.8 kg)  05/14/19 113 lb (51.3 kg)    CAD Coronary Artery Disease: Patient presents for routine coronary artery disease follow-up.  Current symptoms: non-existent Pain radiation: none Patient also complains of no other symptoms. Pain aggravating factors: none Cardiac risk factors include hypertension.   Anemia: Patient presents for presents evaluation of anemia. Anemia was found by routine CBC.  It has been present for years.  She is taking iron ferrous sulfate.  Associated signs & symptoms: none.  Lab Results  Component Value Date   WBC 4.9 02/04/2019   HGB 11.4 (L) 02/04/2019   HCT 35.2 (L) 02/04/2019   MCV 81.2 02/04/2019   PLT 402.0 (H) 02/04/2019     Past Medical History:  Diagnosis Date  . Abnormal finding on Pap smear, ASCUS   . Acute renal insufficiency   . Amputation finger    index  . Barrett's esophagus   . CAD (coronary artery disease)   . Dementia ([place])   .  Diverticulosis   . Elevated BP   . Foot deformity   . GERD (gastroesophageal reflux disease)   . Glaucoma   . H/O: GI bleed   . HSV-2 (herpes simplex virus 2) infection   . Hyperglycemia   . Hypertension   . Migraine   . Osteoporosis    worsening  . Spondylosis     Past Surgical History:  Procedure Laterality Date  . AMPUTATION FINGER / THUMB     secondary to osteomyelitis  . CATARACT EXTRACTION, BILATERAL Bilateral 12/25/2016  . COLONOSCOPY    . ESOPHAGOGASTRODUODENOSCOPY N/A 10/22/2016   Procedure: ESOPHAGOGASTRODUODENOSCOPY (EGD);  Surgeon: Doran Stabler, MD;  Location: [facility] ENDOSCOPY;  Service: Endoscopy;  Laterality: N/A;  . FOOT SURGERY     right  . PARTIAL HYSTERECTOMY    . UPPER GASTROINTESTINAL ENDOSCOPY      Family History  Problem Relation Age of Onset  . Breast cancer Mother   . Heart attack Mother   . Heart disease Father   . Alcohol abuse Son   . Colon cancer Neg Hx   . Stomach cancer Neg Hx   . Rectal cancer Neg Hx     Social History   Socioeconomic History  . Marital status: Single    Spouse name: Not on file  . Number of children: 1  . Years of education: Not on file  . Highest education level: 12th grade  Occupational History    Employer:  RETIRED  Social Needs  . Financial resource strain: Not on file  . Food insecurity    Worry: Not on file    Inability: Not on file  . Transportation needs    Medical: Not on file    Non-medical: Not on file  Tobacco Use  . Smoking status: Never Smoker  . Smokeless tobacco: Never Used  Substance and Sexual Activity  . Alcohol use: No    Alcohol/week: 0.0 standard drinks  . Drug use: No  . Sexual activity: Never    Birth control/protection: Post-menopausal  Lifestyle  . Physical activity    Days per week: Not on file    Minutes per session: Not on file  . Stress: Not on file  Relationships  . Social Herbalist on phone: Not on file    Gets together: Not on file    Attends  religious service: Not on file    Active member of club or organization: Not on file    Attends meetings of clubs or organizations: Not on file    Relationship status: Not on file  . Intimate partner violence    Fear of current or ex partner: Not on file    Emotionally abused: Not on file    Physically abused: Not on file    Forced sexual activity: Not on file  Other Topics Concern  . Not on file  Social History Narrative   Marital status: divorced.      Children: 1 son; no grandchildren      Lives: with son      Employment: retired      Tobacco: none      Alcohol: none      Drugs: none       Exercise: none       ADLs:  Independent with ADLs.  Quit driving age 44.  Medical CNA cleans house; makes breakfast; patient pays bills.      Advanced Directives:   YES.  HCPOA:  Son/Isaac Bowman.  FULL CODE; no prolonged measures.    Outpatient Medications Prior to Visit  Medication Sig Dispense Refill  . Calcium Carbonate-Vitamin D (CALCIUM-VITAMIN D) 500-200 MG-UNIT tablet Take 3 tablets by mouth every morning.    Marland Kitchen ESTRACE VAGINAL 0.1 MG/GM vaginal cream INSERT 1 APPLICATORFUL VAGINALLY ONCE A WEEK 42.5 g 0  . FEROSUL 325 (65 Fe) MG tablet TK 1 T PO QD    . gabapentin (NEURONTIN) 800 MG tablet TAKE 1 TABLET(800 MG) BY MOUTH AT BEDTIME 90 tablet 1  . hydrocortisone 2.5 % ointment APPLY EXTERNALLY TO THE AFFECTED AREA TWICE DAILY 28.35 g 0  . hydroxypropyl methylcellulose (ISOPTO TEARS) 2.5 % ophthalmic solution Place 1 drop into both eyes 2 (two) times daily as needed for dry eyes.     Marland Kitchen lactose free nutrition (BOOST) LIQD Take 237 mLs by mouth 2 (two) times daily between meals. (Patient taking differently: Take 1 Container by mouth 2 (two) times daily as needed (nutritional). ) 60 Can 11  . latanoprost (XALATAN) 0.005 % ophthalmic solution INT 1 GTT IN OD QD    . lidocaine (LIDODERM) 5 % Place 1 patch onto the skin daily. Remove & Discard patch within 12 hours or as directed by MD.  Apply  to the right side of your neck where it hurts 30 patch 0  . nystatin cream (MYCOSTATIN) APP EXT AA BID FOR 10 DAYS PRN  1  . ofloxacin (OCUFLOX) 0.3 % ophthalmic solution 10 drops in  right ear once daily for 5 days. 5 mL 0  . PREMARIN vaginal cream     . sucralfate (CARAFATE) 1 g tablet Take 1 tablet (1 g total) by mouth every 6 (six) hours as needed. Slowly dissolve tablet in 1 Tablespoon of distilled water for 15 minutes before taking (do not crush) 120 tablet 1  . triamcinolone cream (KENALOG) 0.1 % Apply topically 2 (two) times daily. 30 g 1  . vitamin C (ASCORBIC ACID) 500 MG tablet Take 500 mg by mouth daily.    Marland Kitchen ZINC OXIDE, TOPICAL, 10 % CREA 1 application to affected area to prevent moisture and further irritation of skin. 50 g 0  . Elastic Bandages & Supports (LUMBAR BACK BRACE/SUPPORT PAD) MISC Dispense one back brace to be worn while doing activities as needed daily 1 each 1  . ferrous sulfate (FEROSUL) 325 (65 FE) MG tablet Take 1 tablet (325 mg total) by mouth daily. 90 tablet 0  . hydrochlorothiazide (MICROZIDE) 12.5 MG capsule TAKE 1 CAPSULE BY MOUTH EVERY TUESDAY AND SATURDAY 90 capsule 3  . methocarbamol (ROBAXIN) 500 MG tablet Take 1 tablet (500 mg total) by mouth daily as needed for muscle spasms. After doing yard work. 10 tablet 1  . metoprolol succinate (TOPROL-XL) 50 MG 24 hr tablet take 1 tablet by mouth once daily WITH OR IMMEDIATELY FOLLOWING A MEAL 90 tablet 3  . pantoprazole (PROTONIX) 40 MG tablet Take 1 tablet (40 mg total) by mouth 2 (two) times daily. 180 tablet 1  . promethazine (PHENERGAN) 12.5 MG tablet take 1 tablet by mouth every 6 hours if needed for nausea and vomiting 30 tablet 0  . traMADol-acetaminophen (ULTRACET) 37.5-325 MG tablet Take 1 tablet by mouth every 6 (six) hours. 30 tablet 0  . verapamil (CALAN-SR) 240 MG CR tablet Take 1 tablet (240 mg total) by mouth daily. 90 tablet 3   No facility-administered medications prior to visit.     No Known  Allergies  ROS Review of Systems    Review of Systems  Constitutional: Negative for activity change, appetite change, chills and fever.  HENT: Negative for congestion, nosebleeds, trouble swallowing and voice change.   Respiratory: Negative for cough, shortness of breath and wheezing.   Gastrointestinal: Negative for diarrhea, nausea and vomiting.  Genitourinary: Negative for difficulty urinating, dysuria, flank pain and hematuria.  Musculoskeletal: +back pain Neurological: Negative for dizziness, speech difficulty, light-headedness and numbness.  See HPI. All other review of systems negative.   Objective:    Physical Exam  BP (!) 136/56   Pulse 80   Temp 98.5 F (36.9 C)   Resp 12   Ht 4' 10" (1.473 m)   Wt 116 lb (52.6 kg)   SpO2 96%   BMI 24.24 kg/m  Wt Readings from Last 3 Encounters:  11/03/19 116 lb (52.6 kg)  07/16/19 107 lb 9.6 oz (48.8 kg)  05/14/19 113 lb (51.3 kg)   Physical Exam  Constitutional: Oriented to person, place, and time. Appears well-developed and well-nourished. Severely stooped posture with kyphosis.  HENT:  Head: Normocephalic and atraumatic.  Eyes: Conjunctivae and EOM are normal.  Cardiovascular: Normal rate, regular rhythm, normal heart sounds and intact distal pulses.  No murmur heard. Pulmonary/Chest: Effort normal and breath sounds normal. No stridor. No respiratory distress. Has no wheezes.  Neurological: Is alert and oriented to person, place, and time.  Skin: Skin is warm. Capillary refill takes less than 2 seconds. No visible rash Psychiatric: Has a normal mood  and affect. Behavior is normal. Judgment and thought content normal.    There are no preventive care reminders to display for this patient.  There are no preventive care reminders to display for this patient.  Lab Results  Component Value Date   TSH 1.060 01/01/2019   Lab Results  Component Value Date   WBC 4.9 02/04/2019   HGB 11.4 (L) 02/04/2019   HCT 35.2 (L)  02/04/2019   MCV 81.2 02/04/2019   PLT 402.0 (H) 02/04/2019   Lab Results  Component Value Date   NA 144 12/12/2018   K 4.4 12/12/2018   CO2 27 12/12/2018   GLUCOSE 77 12/12/2018   BUN 16 01/03/2019   CREATININE 0.95 12/12/2018   BILITOT <0.2 12/12/2018   ALKPHOS 56 12/12/2018   AST 13 12/12/2018   ALT 9 12/12/2018   PROT 6.6 12/12/2018   ALBUMIN 3.8 12/12/2018   CALCIUM 9.7 12/12/2018   ANIONGAP 7 10/22/2016   Lab Results  Component Value Date   CHOL 176 05/30/2017   Lab Results  Component Value Date   HDL 82 05/30/2017   Lab Results  Component Value Date   LDLCALC 80 05/30/2017   Lab Results  Component Value Date   TRIG 70 05/30/2017   Lab Results  Component Value Date   CHOLHDL 2.1 05/30/2017   Lab Results  Component Value Date   HGBA1C 5.4 05/30/2017      Assessment & Plan:   Problem List Items Addressed This Visit      Cardiovascular and Mediastinum   Essential hypertension - Primary Patient's blood pressure is at goal of 139/89 or less. Condition is stable. Continue current medications and treatment plan. I recommend that you exercise for 30-45 minutes 5 days a week. I also recommend a balanced diet with fruits and vegetables every day, lean meats, and little fried foods. The DASH diet (you can find this online) is a good example of this.    Relevant Medications   metoprolol succinate (TOPROL-XL) 50 MG 24 hr tablet   verapamil (CALAN-SR) 240 MG CR tablet   hydrochlorothiazide (MICROZIDE) 12.5 MG capsule   Other Relevant Orders   CMP14+EGFR   Lipid panel   CBC   CAD (coronary artery disease)   Relevant Medications   metoprolol succinate (TOPROL-XL) 50 MG 24 hr tablet   verapamil (CALAN-SR) 240 MG CR tablet   hydrochlorothiazide (MICROZIDE) 12.5 MG capsule   Other Relevant Orders   CMP14+EGFR   Lipid panel   CBC     Musculoskeletal and Integument   Spondylosis   Relevant Medications   methocarbamol (ROBAXIN) 500 MG tablet    traMADol-acetaminophen (ULTRACET) 37.5-325 MG tablet   Elastic Bandages & Supports (LUMBAR BACK BRACE/SUPPORT PAD) MISC   Degenerative disc disease, lumbar  - advised back brace support Pain relief   Relevant Medications   methocarbamol (ROBAXIN) 500 MG tablet   traMADol-acetaminophen (ULTRACET) 37.5-325 MG tablet   Elastic Bandages & Supports (LUMBAR BACK BRACE/SUPPORT PAD) MISC     Other   ANEMIA, IRON DEFICIENCY   Relevant Medications   ferrous sulfate (FEROSUL) 325 (65 FE) MG tablet   Other Relevant Orders   CBC    Other Visit Diagnoses    Deficiency anemia    - continue iron supplement   Relevant Medications   ferrous sulfate (FEROSUL) 325 (65 FE) MG tablet      Meds ordered this encounter  Medications  . promethazine (PHENERGAN) 12.5 MG tablet    Sig: take 1  tablet by mouth every 6 hours if needed for nausea and vomiting    Dispense:  30 tablet    Refill:  6  . metoprolol succinate (TOPROL-XL) 50 MG 24 hr tablet    Sig: take 1 tablet by mouth once daily WITH OR IMMEDIATELY FOLLOWING A MEAL    Dispense:  90 tablet    Refill:  3  . verapamil (CALAN-SR) 240 MG CR tablet    Sig: Take 1 tablet (240 mg total) by mouth daily.    Dispense:  90 tablet    Refill:  3  . methocarbamol (ROBAXIN) 500 MG tablet    Sig: Take 1 tablet (500 mg total) by mouth daily as needed for muscle spasms. After doing yard work.    Dispense:  30 tablet    Refill:  1  . ferrous sulfate (FEROSUL) 325 (65 FE) MG tablet    Sig: Take 1 tablet (325 mg total) by mouth daily.    Dispense:  90 tablet    Refill:  0  . pantoprazole (PROTONIX) 40 MG tablet    Sig: Take 1 tablet (40 mg total) by mouth 2 (two) times daily.    Dispense:  180 tablet    Refill:  1  . hydrochlorothiazide (MICROZIDE) 12.5 MG capsule    Sig: TAKE 1 CAPSULE BY MOUTH EVERY TUESDAY AND SATURDAY    Dispense:  90 capsule    Refill:  3  . traMADol-acetaminophen (ULTRACET) 37.5-325 MG tablet    Sig: Take 1 tablet by mouth every 6  (six) hours.    Dispense:  60 tablet    Refill:  0  . Elastic Bandages & Supports (LUMBAR BACK BRACE/SUPPORT PAD) MISC    Sig: Dispense one back brace to be worn while doing activities as needed daily    Dispense:  1 each    Refill:  1    Follow-up: Return in about 6 months (around 05/02/2020) for medicare wellness exam.    Forrest Moron, MD

## 2019-11-04 ENCOUNTER — Telehealth: Payer: Self-pay | Admitting: *Deleted

## 2019-11-04 LAB — CBC
Hematocrit: 34.2 % (ref 34.0–46.6)
Hemoglobin: 11.1 g/dL (ref 11.1–15.9)
MCH: 28.1 pg (ref 26.6–33.0)
MCHC: 32.5 g/dL (ref 31.5–35.7)
MCV: 87 fL (ref 79–97)
Platelets: 402 10*3/uL (ref 150–450)
RBC: 3.95 x10E6/uL (ref 3.77–5.28)
RDW: 13.5 % (ref 11.7–15.4)
WBC: 5.4 10*3/uL (ref 3.4–10.8)

## 2019-11-04 LAB — CMP14+EGFR
ALT: 10 IU/L (ref 0–32)
AST: 17 IU/L (ref 0–40)
Albumin/Globulin Ratio: 1.4 (ref 1.2–2.2)
Albumin: 3.7 g/dL (ref 3.6–4.6)
Alkaline Phosphatase: 61 IU/L (ref 39–117)
BUN/Creatinine Ratio: 29 — ABNORMAL HIGH (ref 12–28)
BUN: 26 mg/dL (ref 8–27)
Bilirubin Total: 0.2 mg/dL (ref 0.0–1.2)
CO2: 26 mmol/L (ref 20–29)
Calcium: 9.7 mg/dL (ref 8.7–10.3)
Chloride: 106 mmol/L (ref 96–106)
Creatinine, Ser: 0.91 mg/dL (ref 0.57–1.00)
GFR calc Af Amer: 67 mL/min/{1.73_m2} (ref >59)
GFR calc non Af Amer: 58 mL/min/{1.73_m2} — ABNORMAL LOW (ref >59)
Globulin, Total: 2.6 g/dL (ref 1.5–4.5)
Glucose: 99 mg/dL (ref 65–99)
Potassium: 4.7 mmol/L (ref 3.5–5.2)
Sodium: 146 mmol/L — ABNORMAL HIGH (ref 134–144)
Total Protein: 6.3 g/dL (ref 6.0–8.5)

## 2019-11-04 LAB — LIPID PANEL
Chol/HDL Ratio: 2.1 ratio (ref 0.0–4.4)
Cholesterol, Total: 175 mg/dL (ref 100–199)
HDL: 82 mg/dL (ref >39)
LDL Chol Calc (NIH): 78 mg/dL (ref 0–99)
Triglycerides: 79 mg/dL (ref 0–149)
VLDL Cholesterol Cal: 15 mg/dL (ref 5–40)

## 2019-11-04 NOTE — Telephone Encounter (Signed)
Sent/faxed Rx.Elastic Bandages and Support(Lumbar back Brace/support Pad)Misc--to [facility] Faxed [phone].

## 2019-11-08 MED ORDER — NYSTATIN 100000 UNIT/GM EX CREA: TOPICAL_CREAM | CUTANEOUS | 1 refills | Status: DC

## 2019-11-18 ENCOUNTER — Telehealth: Payer: Self-pay | Admitting: Family Medicine

## 2019-11-18 NOTE — Telephone Encounter (Signed)
Please advise  Copied from [place] [phone]. Topic: Referral - Request for Referral >> Nov 18, 2019  3:50 PM Richardo Priest, [place] wrote: Has patient seen PCP for this complaint? yes *If NO, is insurance requiring patient see PCP for this issue before PCP can refer them? Referral for which specialty: orthopedic Preferred provider/office: N/A Reason for referral: pt's is having back issues for awhile and states she has not received any help

## 2019-11-19 NOTE — Telephone Encounter (Signed)
Pt called to follow up on her referral to ortho and the order for a back brace. Pt would like to know where she needs to go to get a back brace.

## 2019-11-19 NOTE — Telephone Encounter (Signed)
appt schedule with [facility] to address ortho referral and brace for back. Dgaddy, CMA

## 2019-11-24 ENCOUNTER — Encounter: Payer: Self-pay | Admitting: Family Medicine

## 2019-11-24 ENCOUNTER — Ambulatory Visit (INDEPENDENT_AMBULATORY_CARE_PROVIDER_SITE_OTHER): Payer: Medicare Other | Admitting: Family Medicine

## 2019-11-24 ENCOUNTER — Other Ambulatory Visit: Payer: Self-pay

## 2019-11-24 VITALS — BP 131/69 | HR 78 | Temp 98.1°F | Resp 17 | Ht <= 58 in | Wt 106.6 lb

## 2019-11-24 DIAGNOSIS — M5030 Other cervical disc degeneration, unspecified cervical region: Secondary | ICD-10-CM

## 2019-11-24 DIAGNOSIS — I2510 Atherosclerotic heart disease of native coronary artery without angina pectoris: Secondary | ICD-10-CM | POA: Diagnosis not present

## 2019-11-24 DIAGNOSIS — I10 Essential (primary) hypertension: Secondary | ICD-10-CM | POA: Diagnosis not present

## 2019-11-24 DIAGNOSIS — M5136 Other intervertebral disc degeneration, lumbar region: Secondary | ICD-10-CM

## 2019-11-24 NOTE — Progress Notes (Signed)
Established Patient Office Visit  Subjective:  Patient ID: [name], female    DOB: [birth year]  Age: 85 y.o. MRN: [id number]  CC:  Chief Complaint  Patient presents with  . Referral    specialist for her back, wanting neck brace    HPI [name] presents for   Patient reports that she is feeling well and went out to sweep her yard. She reports that she is concerned about her curvature of her spine She states that she has been eating well. She sleeps on a heating pad. She denies any falls and walks nice and slow to avoid falls   Wt Readings from Last 3 Encounters:  11/24/19 106 lb 9.6 oz (48.4 kg)  11/03/19 116 lb (52.6 kg)  07/16/19 107 lb 9.6 oz (48.8 kg)     Past Medical History:  Diagnosis Date  . Abnormal finding on Pap smear, ASCUS   . Acute renal insufficiency   . Amputation finger    index  . Barrett's esophagus   . CAD (coronary artery disease)   . Dementia ([place])   . Diverticulosis   . Elevated BP   . Foot deformity   . GERD (gastroesophageal reflux disease)   . Glaucoma   . H/O: GI bleed   . HSV-2 (herpes simplex virus 2) infection   . Hyperglycemia   . Hypertension   . Migraine   . Osteoporosis    worsening  . Spondylosis     Past Surgical History:  Procedure Laterality Date  . AMPUTATION FINGER / THUMB     secondary to osteomyelitis  . CATARACT EXTRACTION, BILATERAL Bilateral 12/25/2016  . COLONOSCOPY    . ESOPHAGOGASTRODUODENOSCOPY N/A 10/22/2016   Procedure: ESOPHAGOGASTRODUODENOSCOPY (EGD);  Surgeon: Doran Stabler, MD;  Location: [facility] ENDOSCOPY;  Service: Endoscopy;  Laterality: N/A;  . FOOT SURGERY     right  . PARTIAL HYSTERECTOMY    . UPPER GASTROINTESTINAL ENDOSCOPY      Family History  Problem Relation Age of Onset  . Breast cancer Mother   . Heart attack Mother   . Heart disease Father   . Alcohol abuse Son   . Colon cancer Neg Hx   . Stomach cancer Neg Hx   . Rectal cancer Neg Hx     Social History    Socioeconomic History  . Marital status: Single    Spouse name: Not on file  . Number of children: 1  . Years of education: Not on file  . Highest education level: 12th grade  Occupational History    Employer: RETIRED  Tobacco Use  . Smoking status: Never Smoker  . Smokeless tobacco: Never Used  Substance and Sexual Activity  . Alcohol use: No    Alcohol/week: 0.0 standard drinks  . Drug use: No  . Sexual activity: Never    Birth control/protection: Post-menopausal  Other Topics Concern  . Not on file  Social History Narrative   Marital status: divorced.      Children: 1 son; no grandchildren      Lives: with son      Employment: retired      Tobacco: none      Alcohol: none      Drugs: none       Exercise: none       ADLs:  Independent with ADLs.  Quit driving age 51.  Medical CNA cleans house; makes breakfast; patient pays bills.      Advanced Directives:   YES.  HCPOA:  Son/Isaac Bowman.  FULL CODE; no prolonged measures.   Social Determinants of Health   Financial Resource Strain:   . Difficulty of Paying Living Expenses: Not on file  Food Insecurity:   . Worried About Charity fundraiser in the Last Year: Not on file  . Ran Out of Food in the Last Year: Not on file  Transportation Needs:   . Lack of Transportation (Medical): Not on file  . Lack of Transportation (Non-Medical): Not on file  Physical Activity:   . Days of Exercise per Week: Not on file  . Minutes of Exercise per Session: Not on file  Stress:   . Feeling of Stress : Not on file  Social Connections:   . Frequency of Communication with Friends and Family: Not on file  . Frequency of Social Gatherings with Friends and Family: Not on file  . Attends Religious Services: Not on file  . Active Member of Clubs or Organizations: Not on file  . Attends Archivist Meetings: Not on file  . Marital Status: Not on file  Intimate Partner Violence:   . Fear of Current or Ex-Partner: Not on file   . Emotionally Abused: Not on file  . Physically Abused: Not on file  . Sexually Abused: Not on file    Outpatient Medications Prior to Visit  Medication Sig Dispense Refill  . Calcium Carbonate-Vitamin D (CALCIUM-VITAMIN D) 500-200 MG-UNIT tablet Take 3 tablets by mouth every morning.    Water engineer Bandages & Supports (LUMBAR BACK BRACE/SUPPORT PAD) MISC Dispense one back brace to be worn while doing activities as needed daily 1 each 1  . ESTRACE VAGINAL 0.1 MG/GM vaginal cream INSERT 1 APPLICATORFUL VAGINALLY ONCE A WEEK 42.5 g 0  . FEROSUL 325 (65 Fe) MG tablet TK 1 T PO QD    . hydrochlorothiazide (MICROZIDE) 12.5 MG capsule TAKE 1 CAPSULE BY MOUTH EVERY TUESDAY AND SATURDAY 90 capsule 3  . hydrocortisone 2.5 % ointment APPLY EXTERNALLY TO THE AFFECTED AREA TWICE DAILY 28.35 g 0  . hydroxypropyl methylcellulose (ISOPTO TEARS) 2.5 % ophthalmic solution Place 1 drop into both eyes 2 (two) times daily as needed for dry eyes.     Marland Kitchen lactose free nutrition (BOOST) LIQD Take 237 mLs by mouth 2 (two) times daily between meals. (Patient taking differently: Take 1 Container by mouth 2 (two) times daily as needed (nutritional). ) 60 Can 11  . latanoprost (XALATAN) 0.005 % ophthalmic solution INT 1 GTT IN OD QD    . lidocaine (LIDODERM) 5 % Place 1 patch onto the skin daily. Remove & Discard patch within 12 hours or as directed by MD.  Apply to the right side of your neck where it hurts 30 patch 0  . methocarbamol (ROBAXIN) 500 MG tablet Take 1 tablet (500 mg total) by mouth daily as needed for muscle spasms. After doing yard work. 30 tablet 1  . metoprolol succinate (TOPROL-XL) 50 MG 24 hr tablet take 1 tablet by mouth once daily WITH OR IMMEDIATELY FOLLOWING A MEAL 90 tablet 3  . nystatin cream (MYCOSTATIN) Apply to external affected area twice a day for 10 days as needed. 30 g 1  . ofloxacin (OCUFLOX) 0.3 % ophthalmic solution 10 drops in right ear once daily for 5 days. 5 mL 0  . pantoprazole  (PROTONIX) 40 MG tablet Take 1 tablet (40 mg total) by mouth 2 (two) times daily. 180 tablet 1  . PREMARIN vaginal cream     .  promethazine (PHENERGAN) 12.5 MG tablet take 1 tablet by mouth every 6 hours if needed for nausea and vomiting 30 tablet 6  . sucralfate (CARAFATE) 1 g tablet Take 1 tablet (1 g total) by mouth every 6 (six) hours as needed. Slowly dissolve tablet in 1 Tablespoon of distilled water for 15 minutes before taking (do not crush) 120 tablet 1  . triamcinolone cream (KENALOG) 0.1 % Apply topically 2 (two) times daily. 30 g 1  . verapamil (CALAN-SR) 240 MG CR tablet Take 1 tablet (240 mg total) by mouth daily. 90 tablet 3  . vitamin C (ASCORBIC ACID) 500 MG tablet Take 500 mg by mouth daily.    Marland Kitchen ZINC OXIDE, TOPICAL, 10 % CREA 1 application to affected area to prevent moisture and further irritation of skin. 50 g 0  . ferrous sulfate (FEROSUL) 325 (65 FE) MG tablet Take 1 tablet (325 mg total) by mouth daily. 90 tablet 0  . gabapentin (NEURONTIN) 800 MG tablet TAKE 1 TABLET(800 MG) BY MOUTH AT BEDTIME 90 tablet 1  . traMADol-acetaminophen (ULTRACET) 37.5-325 MG tablet Take 1 tablet by mouth every 6 (six) hours. 60 tablet 0   No facility-administered medications prior to visit.    No Known Allergies  ROS Review of Systems Review of Systems  Constitutional: Negative for activity change, appetite change, chills and fever.  HENT: Negative for congestion, nosebleeds, trouble swallowing and voice change.   Respiratory: Negative for cough, shortness of breath and wheezing.   Gastrointestinal: Negative for diarrhea, nausea and vomiting.  Genitourinary: Negative for difficulty urinating, dysuria, flank pain and hematuria.  Musculoskeletal: +low back pain Neurological: Negative for dizziness, speech difficulty, light-headedness and numbness.  See HPI. All other review of systems negative.     Objective:    Physical Exam  BP 131/69 (BP Location: Right Arm, Patient Position:  Sitting, Cuff Size: Normal)   Pulse 78   Temp 98.1 F (36.7 C) (Oral)   Resp 17   Ht 4\' 10"  (1.473 m)   Wt 106 lb 9.6 oz (48.4 kg)   SpO2 96%   BMI 22.28 kg/m  Wt Readings from Last 3 Encounters:  11/24/19 106 lb 9.6 oz (48.4 kg)  11/03/19 116 lb (52.6 kg)  07/16/19 107 lb 9.6 oz (48.8 kg)   Physical Exam  Constitutional: Oriented to person, place, and time. Appears well-developed and well-nourished.  HENT:  Head: Normocephalic and atraumatic.  Eyes: Conjunctivae and EOM are normal.  Cardiovascular: Normal rate, regular rhythm, normal heart sounds and intact distal pulses.  No murmur heard. Pulmonary/Chest: Effort normal and breath sounds normal. No stridor. No respiratory distress. Has no wheezes.  Neurological: Is alert and oriented to person, place, and time.  Skin: Skin is warm. Capillary refill takes less than 2 seconds.  Psychiatric: Has a normal mood and affect. Behavior is normal. Judgment and thought content normal.    There are no preventive care reminders to display for this patient.  There are no preventive care reminders to display for this patient.  Lab Results  Component Value Date   TSH 1.060 01/01/2019   Lab Results  Component Value Date   WBC 5.4 11/03/2019   HGB 11.1 11/03/2019   HCT 34.2 11/03/2019   MCV 87 11/03/2019   PLT 402 11/03/2019   Lab Results  Component Value Date   NA 146 (H) 11/03/2019   K 4.7 11/03/2019   CO2 26 11/03/2019   GLUCOSE 99 11/03/2019   BUN 26 11/03/2019   CREATININE  0.91 11/03/2019   BILITOT 0.2 11/03/2019   ALKPHOS 61 11/03/2019   AST 17 11/03/2019   ALT 10 11/03/2019   PROT 6.3 11/03/2019   ALBUMIN 3.7 11/03/2019   CALCIUM 9.7 11/03/2019   ANIONGAP 7 10/22/2016   Lab Results  Component Value Date   CHOL 175 11/03/2019   Lab Results  Component Value Date   HDL 82 11/03/2019   Lab Results  Component Value Date   LDLCALC 78 11/03/2019   Lab Results  Component Value Date   TRIG 79 11/03/2019   Lab  Results  Component Value Date   CHOLHDL 2.1 11/03/2019   Lab Results  Component Value Date   HGBA1C 5.4 05/30/2017      Assessment & Plan:   Problem List Items Addressed This Visit      Cardiovascular and Mediastinum   Essential hypertension - Primary bp in good range     Musculoskeletal and Integument   Degenerative disc disease, lumbar - Will have patient follow up with Orthopedics      No orders of the defined types were placed in this encounter.   Follow-up: Return in about 3 months (around 02/22/2020) for blood pressure check.    Forrest Moron, MD

## 2019-11-24 NOTE — Patient Instructions (Signed)
° ° ° °  If you have lab work done today you will be contacted with your lab results within the next 2 weeks.  If you have not heard from us then please contact us. The fastest way to get your results is to register for My Chart. ° ° °IF you received an x-ray today, you will receive an invoice from [facility]. Please contact [facility] at [phone] with questions or concerns regarding your invoice.  ° °IF you received labwork today, you will receive an invoice from [facility]. Please contact [facility] at [phone] with questions or concerns regarding your invoice.  ° °Our billing staff will not be able to assist you with questions regarding bills from these companies. ° °You will be contacted with the lab results as soon as they are available. The fastest way to get your results is to activate your My Chart account. Instructions are located on the last page of this paperwork. If you have not heard from us regarding the results in 2 weeks, please contact this office. °  ° ° ° °

## 2019-12-01 ENCOUNTER — Ambulatory Visit (INDEPENDENT_AMBULATORY_CARE_PROVIDER_SITE_OTHER): Payer: Medicare Other | Admitting: Podiatry

## 2019-12-01 ENCOUNTER — Encounter: Payer: Self-pay | Admitting: Podiatry

## 2019-12-01 ENCOUNTER — Other Ambulatory Visit: Payer: Self-pay

## 2019-12-01 DIAGNOSIS — M79671 Pain in right foot: Secondary | ICD-10-CM | POA: Diagnosis not present

## 2019-12-01 DIAGNOSIS — L84 Corns and callosities: Secondary | ICD-10-CM

## 2019-12-01 DIAGNOSIS — M79676 Pain in unspecified toe(s): Secondary | ICD-10-CM | POA: Diagnosis not present

## 2019-12-01 DIAGNOSIS — M79672 Pain in left foot: Secondary | ICD-10-CM | POA: Diagnosis not present

## 2019-12-01 DIAGNOSIS — B351 Tinea unguium: Secondary | ICD-10-CM | POA: Diagnosis not present

## 2019-12-01 NOTE — Patient Instructions (Signed)

## 2019-12-07 NOTE — Progress Notes (Signed)
Subjective: [name] is a 85 y.o. y.o. female who presents today for painful calluses and  painful, discolored, thick toenails which interfere with daily activities. Pain is aggravated when wearing enclosed shoe gear and relieved with periodic professional debridement.  Forrest Moron, MD is her PCP.   Medications reviewed in chart.  No Known Allergies  Objective: There were no vitals filed for this visit.  Vascular Examination: Capillary refill time immediate to digits b/l.  Dorsalis pedis pulses palpable b/l.  Posterior tibial pulses palpable b/l.  Digital hair absent b/l.   Skin temperature gradient WNL b/l.  Dermatological Examination: Skin with normal turgor, texture and tone b/l.  Toenails 1-5 b/l discolored, thick, dystrophic with subungual debris and pain with palpation to nailbeds due to thickness of nails.  Hyperkeratotic lesion sub 5th met base b/l, sub heel b/l with tenderness to palpation. No edema, no erythema, no drainage, no flocculence.  Musculoskeletal: Muscle strength 5/5 to all LE muscle groups b/l.  HAV with bunion deformity b/l with overlapping hammertoes b/l 2nd digits.  Neurological Examination: Protective sensation intact with 10 gram monofilament bilaterally.  Epicritic sensation present bilaterally.  Assessment: 1. Painful onychomycosis toenails 1-5 b/l 2.  Calluses sub 5th met base b/l and sub heel b/l 3.   Pain in both feet  Plan: 1. Continue diabetic foot care principles. Literature dispensed on today. 2. Toenails 1-5 b/l were debrided in length and girth without iatrogenic bleeding. 3. Calluses pared sub 5th met base b/l and sub heel utilizing sterile scalpel blade without incident.  4. Patient to continue soft, supportive shoe gear daily. 5. Patient to report any pedal injuries to medical professional immediately. 6. Follow up 3 months.  7. Patient/POA to call should there be a concern in the interim.

## 2019-12-24 ENCOUNTER — Other Ambulatory Visit: Payer: Self-pay | Admitting: Family Medicine

## 2019-12-24 NOTE — Telephone Encounter (Signed)
Medication Refill - Medication: traMADol-acetaminophen (ULTRACET) 37.5-325 MG tablet   Has the patient contacted their pharmacy? Yes.   (Agent: If no, request that the patient contact the pharmacy for the refill.) (Agent: If yes, when and what did the pharmacy advise?)  Preferred Pharmacy (with phone number or street name):  [facility] [phone] - [place], [place] AT [place] Phone:  [phone]  Fax:  [phone]       Agent: Please be advised that RX refills may take up to 3 business days. We ask that you follow-up with your pharmacy.

## 2019-12-24 NOTE — Telephone Encounter (Signed)
Patient is requesting a refill of the following medications: Requested Prescriptions    No prescriptions requested or ordered in this encounter    Date of patient request: 12/24/19 Last office visit: 11/24/19 Date of last refill: 11/03/2019 Last refill amount:60 orf Follow up time period per chart: 02/23/2019

## 2019-12-29 ENCOUNTER — Other Ambulatory Visit: Payer: Self-pay | Admitting: Family Medicine

## 2019-12-29 NOTE — Telephone Encounter (Signed)
Pt would like a callback once rx has been sent to pharm

## 2019-12-30 ENCOUNTER — Other Ambulatory Visit: Payer: Self-pay | Admitting: Family Medicine

## 2019-12-30 NOTE — Telephone Encounter (Signed)
Medication Refill - Medication: traMADol-acetaminophen (ULTRACET) 37.5-325 MG tablet [id number]      Preferred Pharmacy (with phone number or street name):  [facility] [phone] - [place], [place] - [address]  [place] [address]  Phone: [phone] Fax: [phone]    Agent: Please be advised that RX refills may take up to 3 business days. We ask that you follow-up with your pharmacy.

## 2019-12-30 NOTE — Telephone Encounter (Signed)
Requested medication (s) are due for refill today: yes  Requested medication (s) are on the active medication list: yes  Last refill:  11/03/2019  Future visit scheduled: yes  Notes to clinic: This refill cannot be delegated    Requested Prescriptions  Pending Prescriptions Disp Refills   traMADol-acetaminophen (ULTRACET) 37.5-325 MG tablet 60 tablet 0    Sig: Take 1 tablet by mouth every 6 (six) hours.      Not Delegated - Analgesics:  Opioid Agonist Combinations Failed - 12/30/2019 12:47 PM      Failed - This refill cannot be delegated      Failed - Urine Drug Screen completed in last 360 days.      Passed - Valid encounter within last 6 months    Recent Outpatient Visits           1 month ago Essential hypertension   Primary Care at [facility], Arlie Solomons, MD   1 month ago Essential hypertension   Primary Care at [facility], Arlie Solomons, MD   5 months ago Urinary frequency   Primary Care at [facility], Arlie Solomons, MD   7 months ago Urinary frequency   Primary Care at [facility], Ranell Patrick, MD   7 months ago Chronic gastric ulcer without hemorrhage and without perforation   Primary Care at [place], MD       Future Appointments             In 1 month Forrest Moron, MD Primary Care at [place], [facility]   In 3 months Forrest Moron, MD Primary Care at [place], [facility]

## 2020-01-01 ENCOUNTER — Other Ambulatory Visit: Payer: Self-pay | Admitting: Family Medicine

## 2020-01-01 NOTE — Telephone Encounter (Signed)
Patient is requesting a refill of the following medications: Requested Prescriptions   Pending Prescriptions Disp Refills   traMADol-acetaminophen (ULTRACET) 37.5-325 MG tablet 60 tablet 0    Sig: Take 1 tablet by mouth every 6 (six) hours.    Date of patient request: 01/01/20 Last office visit: 11/24/19 Date of last refill: 11/03/19 Last refill amount: 60  Follow up time period per chart: 02/23/2020

## 2020-01-01 NOTE — Telephone Encounter (Signed)
Requested medication (s) are due for refill today: yes  Requested medication (s) are on the active medication list: yes  Last refill:  11/03/2019  Future visit scheduled: yes  Notes to clinic: this refill cannot be delegated    Requested Prescriptions  Pending Prescriptions Disp Refills   traMADol-acetaminophen (ULTRACET) 37.5-325 MG tablet 60 tablet 0    Sig: Take 1 tablet by mouth every 6 (six) hours.      Not Delegated - Analgesics:  Opioid Agonist Combinations Failed - 01/01/2020 12:14 PM      Failed - This refill cannot be delegated      Failed - Urine Drug Screen completed in last 360 days.      Passed - Valid encounter within last 6 months    Recent Outpatient Visits           1 month ago Essential hypertension   Primary Care at [facility], Arlie Solomons, MD   1 month ago Essential hypertension   Primary Care at [facility], Arlie Solomons, MD   5 months ago Urinary frequency   Primary Care at [facility], Arlie Solomons, MD   7 months ago Urinary frequency   Primary Care at [facility], Ranell Patrick, MD   8 months ago Chronic gastric ulcer without hemorrhage and without perforation   Primary Care at [place], MD       Future Appointments             In 1 month Forrest Moron, MD Primary Care at [place], [facility]   In 3 months Forrest Moron, MD Primary Care at [place], [facility]

## 2020-01-01 NOTE — Telephone Encounter (Signed)
Copied from [place] [id number]. Topic: Quick Communication - Rx Refill/Question >> Jan 01, 2020 11:48 AM Leward Quan A wrote: Medication: traMADol-acetaminophen (ULTRACET) 37.5-325 MG tablet   Has the patient contacted their pharmacy? Yes.   (Agent: If no, request that the patient contact the pharmacy for the refill.) (Agent: If yes, when and what did the pharmacy advise?)  Preferred Pharmacy (with phone number or street name): [facility] [phone] - [place], [address] AT [address] [place]  Phone:  [phone] Fax:  [phone]     Agent: Please be advised that RX refills may take up to 3 business days. We ask that you follow-up with your pharmacy.

## 2020-01-02 MED ORDER — TRAMADOL-ACETAMINOPHEN 37.5-325 MG PO TABS: 1.0000 | ORAL_TABLET | Freq: Four times a day (QID) | ORAL | 0 refills | Status: DC

## 2020-01-11 ENCOUNTER — Other Ambulatory Visit: Payer: Self-pay | Admitting: Family Medicine

## 2020-01-11 DIAGNOSIS — D539 Nutritional anemia, unspecified: Secondary | ICD-10-CM

## 2020-01-12 NOTE — Telephone Encounter (Signed)
Called [facility] to cancel Ferrous Sulfate Rx that I just renewed due to noticing it is a duplicate and the other one was renewed last week for the same dose and signature.

## 2020-02-02 ENCOUNTER — Other Ambulatory Visit: Payer: Self-pay | Admitting: Family Medicine

## 2020-02-02 ENCOUNTER — Ambulatory Visit: Payer: Self-pay

## 2020-02-02 NOTE — Telephone Encounter (Signed)
Pt called to ask for a refill for traMADol-acetaminophen (ULTRACET) 37.5-325 MG tablet For her back pain/ Please advise   Pt also asked for someone to advise her if she should get the covid vaccine or not

## 2020-02-02 NOTE — Telephone Encounter (Signed)
Patient is requesting a refill of the following medications: Requested Prescriptions    No prescriptions requested or ordered in this encounter   Date of patient request: 02/02/20 Last office visit: 11//30/ Date of last refill: 01/02/20 Last refill amount: 60 Follow up time period per chart: 02/23/20

## 2020-02-02 NOTE — Telephone Encounter (Signed)
FYI

## 2020-02-02 NOTE — Telephone Encounter (Signed)
Pt requesting refill of tramadol-acetaminophen to pharmacy on file Last refill 01/02/20 #60 Last OV 11/24/19 Next appt scheduled 02/23/20 Medication is on active medication list NT not delegated to refill controlled substance.

## 2020-02-02 NOTE — Telephone Encounter (Signed)
Pt reports constipation for a month. Pt stated that she started taking stool softener today. Pt stated that she often has to manually removed stool. Last BM today. Pt noted small amount of red blood after she pulled stool out on tissue. Pt stated having no abdominal pain but stated she is more distended. Pt stated she had 1 episode of vomiting that resolved after taking alka seltzer. Pt takes prescribed opiates. Pt stated she eats canned peaches but no fresh fruit. Care advice given. Pt needs appointment within 24 hours per protocol. Office closed. Will forward note.  Pt stated that she needs a refill of tramadol. Will sned separate request.   Reason for Disposition . [1] Minor bleeding from rectum (e.g., blood just on toilet paper, few drops, streaks on surface of normal formed BM) AND [2] 3 or more times . Last bowel movement (BM) > 4 days ago  Answer Assessment - Initial Assessment Questions 1. STOOL PATTERN OR FREQUENCY: "How often do you pass bowel movements (BMs)?"  (Normal range: tid to q 3 days)  "When was your last BM?"       Every day  2. STRAINING: "Do you have to strain to have a BM?"      yes 3. RECTAL PAIN: "Does your rectum hurt when the stool comes out?" If so, ask: "Do you have hemorrhoids? How bad is the pain?"  (Scale 1-10; or mild, moderate, severe)     no 4. STOOL COMPOSITION: "Are the stools hard?"      yes 5. BLOOD ON STOOLS: "Has there been any blood on the toilet tissue or on the surface of the BM?" If so, ask: "When was the last time?"      Yes today and bright red- today 6. CHRONIC CONSTIPATION: "Is this a new problem for you?"  If no, ask: How long have you had this problem?" (days, weeks, months)      1 month 7. CHANGES IN DIET: "Have there been any recent changes in your diet?"     no 8. MEDICATIONS: "Have you been taking any new medications?" "Are you taking any narcotic pain medications?" (e.g., Vicoden, Percocet, morphine, dilaudid)     no 9. LAXATIVES: "Have  you been using any laxatives or enemas?"  If yes, ask "What, how often, and when was the last time?"     colace 10. CAUSE: "What do you think is causing the constipation?"        11. CANCER: "What type of cancer do you have?"       *No Answer* 12. CANCER - TREATMENT: "What cancer treatments have you received?" "When did you last receive them?" (e.g., recent surgery, radiation, or chemotherapy). If triager has access to the patient's medical record, triager should review treatments and administration dates.       *No Answer* 13. CANCER - NEUTROPENIA RISK: "Have you received chemotherapy recently? If Yes, "When was it and what was your last CBC and ANC (absolute neutrophil count)?" "Were you told that your white cell count was low?" If triager has access to the patient's medical record, triager should review most recent labs. An ANC less than 1,000 - 1,500 means that the neutrophils are low and the immune system is weak.       *No Answer* 14. OTHER SYMPTOMS: "Do you have any other symptoms?" (e.g., abdominal pain, fever, vomiting)      no  Answer Assessment - Initial Assessment Questions 1. STOOL PATTERN OR FREQUENCY: "How often do you pass  bowel movements (BMs)?"  (Normal range: tid to q 3 days)  "When was the last BM passed?"       Every day but has to pull stool out of rectum 2. STRAINING: "Do you have to strain to have a BM?"      yes 3. RECTAL PAIN: "Does your rectum hurt when the stool comes out?" If so, ask: "Do you have hemorrhoids? How bad is the pain?"  (Scale 1-10; or mild, moderate, severe)     Denies rectal pain 4. STOOL COMPOSITION: "Are the stools hard?"      yes 5. BLOOD ON STOOLS: "Has there been any blood on the toilet tissue or on the surface of the BM?" If so, ask: "When was the last time?"      Noted on toilet tisue- small amount bright red in color 6. CHRONIC CONSTIPATION: "Is this a new problem for you?"  If no, ask: "How long have you had this problem?" (days, weeks,  months)      For a month 7. CHANGES IN DIET: "Have there been any recent changes in your diet?"      Lack of fiber 8. MEDICATIONS: "Have you been taking any new medications?"     no 9. LAXATIVES: "Have you been using any laxatives or enemas?"  If yes, ask "What, how often, and when was the last time?"     No - just stool softener which she started today 10. CAUSE: "What do you think is causing the constipation?"        Pt. Stated she doesn't know. 11. OTHER SYMPTOMS: "Do you have any other symptoms?" (e.g., abdominal pain, fever, vomiting)       denies 12. PREGNANCY: "Is there any chance you are pregnant?" "When was your last menstrual period?"       n/a  Protocols used: [place]

## 2020-02-03 NOTE — Telephone Encounter (Signed)
Pt is aware med refill can take up to 3 business days

## 2020-02-03 NOTE — Telephone Encounter (Signed)
Patient is requesting a refill of the following medications: Requested Prescriptions   Pending Prescriptions Disp Refills   traMADol-acetaminophen (ULTRACET) 37.5-325 MG tablet 60 tablet 0    Sig: Take 1 tablet by mouth every 6 (six) hours.    Date of patient request: 02/02/20 Last office visit: 11/24/19 Date of last refill: 01/02/2020 Last refill amount: 30 Follow up time period per chart: 02/23/20

## 2020-02-04 ENCOUNTER — Other Ambulatory Visit: Payer: Self-pay

## 2020-02-04 ENCOUNTER — Telehealth: Payer: Self-pay | Admitting: Family Medicine

## 2020-02-04 DIAGNOSIS — M5136 Other intervertebral disc degeneration, lumbar region: Secondary | ICD-10-CM

## 2020-02-04 MED ORDER — TRAMADOL-ACETAMINOPHEN 37.5-325 MG PO TABS: 1.0000 | ORAL_TABLET | Freq: Four times a day (QID) | ORAL | 0 refills | Status: DC

## 2020-02-04 NOTE — Telephone Encounter (Signed)
Msg has already been sent on this request

## 2020-02-04 NOTE — Telephone Encounter (Signed)
Copied from [place] [id number]. Topic: General - Other >> Feb 04, 2020  8:23 AM Celene Kras wrote: Reason for CRM: Pt called and is requesting to have the back doctor. Please advise.

## 2020-02-04 NOTE — Telephone Encounter (Signed)
Patient is calling because she has not gotten her medication yet from the pharmacy.  Please advise.

## 2020-02-04 NOTE — Telephone Encounter (Signed)
Medication sent in. Please notify the patient.

## 2020-02-06 DIAGNOSIS — Z23 Encounter for immunization: Secondary | ICD-10-CM | POA: Diagnosis not present

## 2020-02-23 ENCOUNTER — Ambulatory Visit: Payer: Medicare Other | Admitting: Family Medicine

## 2020-02-24 DIAGNOSIS — H401131 Primary open-angle glaucoma, bilateral, mild stage: Secondary | ICD-10-CM | POA: Diagnosis not present

## 2020-02-24 DIAGNOSIS — Z961 Presence of intraocular lens: Secondary | ICD-10-CM | POA: Diagnosis not present

## 2020-02-24 DIAGNOSIS — H04123 Dry eye syndrome of bilateral lacrimal glands: Secondary | ICD-10-CM | POA: Diagnosis not present

## 2020-02-24 LAB — HM DIABETES EYE EXAM

## 2020-02-25 ENCOUNTER — Ambulatory Visit (INDEPENDENT_AMBULATORY_CARE_PROVIDER_SITE_OTHER): Payer: Medicare Other | Admitting: Family Medicine

## 2020-02-25 ENCOUNTER — Other Ambulatory Visit: Payer: Self-pay

## 2020-02-25 VITALS — BP 122/80 | HR 86 | Temp 98.1°F | Resp 14 | Ht <= 58 in | Wt 118.0 lb

## 2020-02-25 DIAGNOSIS — M5136 Other intervertebral disc degeneration, lumbar region: Secondary | ICD-10-CM | POA: Diagnosis not present

## 2020-02-25 DIAGNOSIS — I10 Essential (primary) hypertension: Secondary | ICD-10-CM

## 2020-02-25 MED ORDER — TRAMADOL-ACETAMINOPHEN 37.5-325 MG PO TABS: 1.0000 | ORAL_TABLET | Freq: Four times a day (QID) | ORAL | 0 refills | Status: DC

## 2020-02-25 NOTE — Progress Notes (Signed)
Established Patient Office Visit  Subjective:  Patient ID: [name], female    DOB: 1933/02/25  Age: 86 y.o. MRN: [id number]  CC:  Chief Complaint  Patient presents with   Hypertension    pt does not take BP at home, pt denies physical symptoms     HPI [name] presents for   Hypertension: Patient here for follow-up of elevated blood pressure. She is exercising and is adherent to low salt diet.  Blood pressure is well controlled at home. Cardiac symptoms none. Patient denies chest pain, chest pressure/discomfort, dyspnea, exertional chest pressure/discomfort, fatigue, and lower extremity edema.  Cardiovascular risk factors: advanced age (older than 55 for men, 65 for women), diabetes mellitus, and hypertension. Use of agents associated with hypertension: none. History of target organ damage: none. Patient states that she is doing well with her bp and avoid adding salt to foods She reports that she has no palpitations She is ready to pick up leaves again  BP Readings from Last 3 Encounters:  02/25/20 122/80  11/24/19 131/69  11/03/19 (!) 136/56    Chronic Back Pain Pt reports that she has pain in her back She has severe deformity She reports that she feel like a shot or something would be helpful for her.  She does not desire surgery due to her age. She takes the pain pills without problems.  Past Medical History:  Diagnosis Date   Abnormal finding on Pap smear, ASCUS    Acute renal insufficiency    Amputation finger    index   Barrett's esophagus    CAD (coronary artery disease)    Dementia (HCC)    Diverticulosis    Elevated BP    Foot deformity    GERD (gastroesophageal reflux disease)    Glaucoma    H/O: GI bleed    HSV-2 (herpes simplex virus 2) infection    Hyperglycemia    Hypertension    Migraine    Osteoporosis    worsening   Spondylosis     Past Surgical History:  Procedure Laterality Date   AMPUTATION FINGER / THUMB     secondary to  osteomyelitis   CATARACT EXTRACTION, BILATERAL Bilateral 12/25/2016   COLONOSCOPY     ESOPHAGOGASTRODUODENOSCOPY N/A 10/22/2016   Procedure: ESOPHAGOGASTRODUODENOSCOPY (EGD);  Surgeon: Doran Stabler, MD;  Location: [facility] ENDOSCOPY;  Service: Endoscopy;  Laterality: N/A;   FOOT SURGERY     right   PARTIAL HYSTERECTOMY     UPPER GASTROINTESTINAL ENDOSCOPY      Family History  Problem Relation Age of Onset   Breast cancer Mother    Heart attack Mother    Heart disease Father    Alcohol abuse Son    Colon cancer Neg Hx    Stomach cancer Neg Hx    Rectal cancer Neg Hx     Social History   Socioeconomic History   Marital status: Single    Spouse name: Not on file   Number of children: 1   Years of education: Not on file   Highest education level: 12th grade  Occupational History    Employer: RETIRED  Tobacco Use   Smoking status: Never Smoker   Smokeless tobacco: Never Used  Substance and Sexual Activity   Alcohol use: No    Alcohol/week: 0.0 standard drinks   Drug use: No   Sexual activity: Never    Birth control/protection: Post-menopausal  Other Topics Concern   Not on file  Social History Narrative  Marital status: divorced.      Children: 1 son; no grandchildren      Lives: with son      Employment: retired      Tobacco: none      Alcohol: none      Drugs: none       Exercise: none       ADLs:  Independent with ADLs.  Quit driving age 22.  Medical CNA cleans house; makes breakfast; patient pays bills.      Advanced Directives:   YES.  HCPOA:  Son/Isaac Bowman.  FULL CODE; no prolonged measures.   Social Determinants of Health   Financial Resource Strain:    Difficulty of Paying Living Expenses: Not on file  Food Insecurity:    Worried About Charity fundraiser in the Last Year: Not on file   [employer] of Food in the Last Year: Not on file  Transportation Needs:    Lack of Transportation (Medical): Not on file   Lack of Transportation (Non-Medical): Not  on file  Physical Activity:    Days of Exercise per Week: Not on file   Minutes of Exercise per Session: Not on file  Stress:    Feeling of Stress : Not on file  Social Connections:    Frequency of Communication with Friends and Family: Not on file   Frequency of Social Gatherings with Friends and Family: Not on file   Attends Religious Services: Not on file   Active Member of Clubs or Organizations: Not on file   Attends Archivist Meetings: Not on file   Marital Status: Not on file  Intimate Partner Violence:    Fear of Current or Ex-Partner: Not on file   Emotionally Abused: Not on file   Physically Abused: Not on file   Sexually Abused: Not on file    Outpatient Medications Prior to Visit  Medication Sig Dispense Refill   Calcium Carbonate-Vitamin D (CALCIUM-VITAMIN D) 500-200 MG-UNIT tablet Take 3 tablets by mouth every morning.     Elastic Bandages & Supports (LUMBAR BACK BRACE/SUPPORT PAD) MISC Dispense one back brace to be worn while doing activities as needed daily 1 each 1   ESTRACE VAGINAL 0.1 MG/GM vaginal cream INSERT 1 APPLICATORFUL VAGINALLY ONCE A WEEK 42.5 g 0   FEROSUL 325 (65 Fe) MG tablet TK 1 T PO QD     FEROSUL 325 (65 Fe) MG tablet TAKE 1 TABLET(325 MG) BY MOUTH DAILY 30 tablet 0   gabapentin (NEURONTIN) 800 MG tablet TAKE 1 TABLET(800 MG) BY MOUTH AT BEDTIME 90 tablet 1   hydrochlorothiazide (MICROZIDE) 12.5 MG capsule TAKE 1 CAPSULE BY MOUTH EVERY TUESDAY AND SATURDAY 90 capsule 3   hydrocortisone 2.5 % ointment APPLY EXTERNALLY TO THE AFFECTED AREA TWICE DAILY 28.35 g 0   hydroxypropyl methylcellulose (ISOPTO TEARS) 2.5 % ophthalmic solution Place 1 drop into both eyes 2 (two) times daily as needed for dry eyes.      lactose free nutrition (BOOST) LIQD Take 237 mLs by mouth 2 (two) times daily between meals. (Patient taking differently: Take 1 Container by mouth 2 (two) times daily as needed (nutritional). ) 60 Can 11   latanoprost (XALATAN) 0.005 %  ophthalmic solution INT 1 GTT IN OD QD     lidocaine (LIDODERM) 5 % Place 1 patch onto the skin daily. Remove & Discard patch within 12 hours or as directed by MD.  Apply to the right side of your neck where it  hurts 30 patch 0   methocarbamol (ROBAXIN) 500 MG tablet Take 1 tablet (500 mg total) by mouth daily as needed for muscle spasms. After doing yard work. 30 tablet 1   metoprolol succinate (TOPROL-XL) 50 MG 24 hr tablet take 1 tablet by mouth once daily WITH OR IMMEDIATELY FOLLOWING A MEAL 90 tablet 3   nystatin cream (MYCOSTATIN) Apply to external affected area twice a day for 10 days as needed. 30 g 1   ofloxacin (OCUFLOX) 0.3 % ophthalmic solution 10 drops in right ear once daily for 5 days. 5 mL 0   pantoprazole (PROTONIX) 40 MG tablet Take 1 tablet (40 mg total) by mouth 2 (two) times daily. 180 tablet 1   PREMARIN vaginal cream      promethazine (PHENERGAN) 12.5 MG tablet take 1 tablet by mouth every 6 hours if needed for nausea and vomiting 30 tablet 6   sucralfate (CARAFATE) 1 g tablet Take 1 tablet (1 g total) by mouth every 6 (six) hours as needed. Slowly dissolve tablet in 1 Tablespoon of distilled water for 15 minutes before taking (do not crush) 120 tablet 1   triamcinolone cream (KENALOG) 0.1 % Apply topically 2 (two) times daily. 30 g 1   verapamil (CALAN-SR) 240 MG CR tablet Take 1 tablet (240 mg total) by mouth daily. 90 tablet 3   vitamin C (ASCORBIC ACID) 500 MG tablet Take 500 mg by mouth daily.     ZINC OXIDE, TOPICAL, 10 % CREA 1 application to affected area to prevent moisture and further irritation of skin. 50 g 0   traMADol-acetaminophen (ULTRACET) 37.5-325 MG tablet Take 1 tablet by mouth every 6 (six) hours. 60 tablet 0   No facility-administered medications prior to visit.    No Known Allergies  ROS Review of Systems Review of Systems  Constitutional: Negative for activity change, appetite change, chills and fever.  HENT: Negative for congestion, nosebleeds,  trouble swallowing and voice change.   Respiratory: Negative for cough, shortness of breath and wheezing.   Gastrointestinal: Negative for diarrhea, nausea and vomiting.  Genitourinary: Negative for difficulty urinating, dysuria, flank pain and hematuria.  Musculoskeletal: Negative for back pain, joint swelling and neck pain.  Neurological: Negative for dizziness, speech difficulty, light-headedness and numbness.  See HPI. All other review of systems negative.     Objective:    Physical Exam  BP 122/80 (BP Location: Left Arm, Patient Position: Sitting)   Pulse 86   Temp 98.1 F (36.7 C) (Temporal)   Resp 14   Ht 4\' 10"  (1.473 m)   Wt 118 lb (53.5 kg)   SpO2 97%   BMI 24.66 kg/m  Wt Readings from Last 3 Encounters:  02/25/20 118 lb (53.5 kg)  11/24/19 106 lb 9.6 oz (48.4 kg)  11/03/19 116 lb (52.6 kg)   Physical Exam  Constitutional: Oriented to person, place, and time. Appears well-developed and well-nourished.  HENT:  Head: Normocephalic and atraumatic.  Eyes: Conjunctivae and EOM are normal.  Cardiovascular: Normal rate, regular rhythm, normal heart sounds and intact distal pulses.  No murmur heard. Pulmonary/Chest: Effort normal and breath sounds normal. No stridor. No respiratory distress. Has no wheezes.  Neurological: Is alert and oriented to person, place, and time.  Skin: Skin is warm. Capillary refill takes less than 2 seconds.  Psychiatric: Has a normal mood and affect. Behavior is normal. Judgment and thought content normal.   Pt noted to have kyphosis, scoliosisof the spine with OA changes of the hands  There are no preventive care reminders to display for this patient.  There are no preventive care reminders to display for this patient.  Lab Results  Component Value Date   TSH 1.060 01/01/2019   Lab Results  Component Value Date   WBC 5.4 11/03/2019   HGB 11.1 11/03/2019   HCT 34.2 11/03/2019   MCV 87 11/03/2019   PLT 402 11/03/2019   Lab  Results  Component Value Date   NA 146 (H) 11/03/2019   K 4.7 11/03/2019   CO2 26 11/03/2019   GLUCOSE 99 11/03/2019   BUN 26 11/03/2019   CREATININE 0.91 11/03/2019   BILITOT 0.2 11/03/2019   ALKPHOS 61 11/03/2019   AST 17 11/03/2019   ALT 10 11/03/2019   PROT 6.3 11/03/2019   ALBUMIN 3.7 11/03/2019   CALCIUM 9.7 11/03/2019   ANIONGAP 7 10/22/2016   Lab Results  Component Value Date   CHOL 175 11/03/2019   Lab Results  Component Value Date   HDL 82 11/03/2019   Lab Results  Component Value Date   LDLCALC 78 11/03/2019   Lab Results  Component Value Date   TRIG 79 11/03/2019   Lab Results  Component Value Date   CHOLHDL 2.1 11/03/2019   Lab Results  Component Value Date   HGBA1C 5.4 05/30/2017      Assessment & Plan:   Problem List Items Addressed This Visit       Cardiovascular and Mediastinum   Essential hypertension - Patient's blood pressure is at goal of 139/89 or less. Condition is stable. Continue current medications and treatment plan. I recommend that you exercise for 30-45 minutes 5 days a week. I also recommend a balanced diet with fruits and vegetables every day, lean meats, and little fried foods. The DASH diet (you can find this online) is a good example of this.      Musculoskeletal and Integument   Degenerative disc disease, lumbar - Primary  - advised pt to continue with pain meds, continue activity applying fall precautions.    Relevant Medications   traMADol-acetaminophen (ULTRACET) 37.5-325 MG tablet (Start on 03/03/2020)   Other Relevant Orders   Ambulatory referral to Orthopedic Surgery       Meds ordered this encounter  Medications   traMADol-acetaminophen (ULTRACET) 37.5-325 MG tablet    Sig: Take 1 tablet by mouth every 6 (six) hours.    Dispense:  60 tablet    Refill:  0    Follow-up: Return in about 3 months (around 05/27/2020) for hypertension.    Forrest Moron, MD

## 2020-02-25 NOTE — Patient Instructions (Addendum)
Orthopedic Surgery (Back Doctor) -  Phone [phone]  Your weight is getting better.  Wt Readings from Last 3 Encounters:  02/25/20 118 lb (53.5 kg)  11/24/19 106 lb 9.6 oz (48.4 kg)  11/03/19 116 lb (52.6 kg)      If you have lab work done today you will be contacted with your lab results within the next 2 weeks.  If you have not heard from [place] then please contact us. The fastest way to get your results is to register for My Chart.   IF you received an x-ray today, you will receive an invoice from [facility]. Please contact [facility] at [phone] with questions or concerns regarding your invoice.   IF you received labwork today, you will receive an invoice from [facility] [place]. Please contact [facility] at [phone] with questions or concerns regarding your invoice.   Our billing staff will not be able to assist you with questions regarding bills from these companies.  You will be contacted with the lab results as soon as they are available. The fastest way to get your results is to activate your My Chart account. Instructions are located on the last page of this paperwork. If you have not heard from [place] regarding the results in 2 weeks, please contact this office.

## 2020-03-01 ENCOUNTER — Ambulatory Visit (INDEPENDENT_AMBULATORY_CARE_PROVIDER_SITE_OTHER): Payer: Medicare Other | Admitting: Podiatry

## 2020-03-01 ENCOUNTER — Encounter: Payer: Self-pay | Admitting: Podiatry

## 2020-03-01 ENCOUNTER — Other Ambulatory Visit: Payer: Self-pay

## 2020-03-01 DIAGNOSIS — B351 Tinea unguium: Secondary | ICD-10-CM

## 2020-03-01 DIAGNOSIS — M79676 Pain in unspecified toe(s): Secondary | ICD-10-CM | POA: Diagnosis not present

## 2020-03-01 NOTE — Patient Instructions (Signed)

## 2020-03-02 ENCOUNTER — Telehealth: Payer: Self-pay | Admitting: Family Medicine

## 2020-03-02 NOTE — Telephone Encounter (Signed)
Please have provider sign ovc note so the patient's referral can be sent. Thank you.

## 2020-03-03 ENCOUNTER — Other Ambulatory Visit: Payer: Self-pay | Admitting: Family Medicine

## 2020-03-03 NOTE — Telephone Encounter (Signed)
Requested Prescriptions  Pending Prescriptions Disp Refills  . metoprolol succinate (TOPROL-XL) 50 MG 24 hr tablet [Pharmacy Med Name: METOPROLOL ER SUCCINATE 50MG  TABS] 90 tablet 1    Sig: TAKE 1 TABLET BY MOUTH EVERY DAY WITH OR IMMEDIATELY FOLLOWING A MEAL     Cardiovascular:  Beta Blockers Passed - 03/03/2020  3:09 AM      Passed - Last BP in normal range    BP Readings from Last 1 Encounters:  02/25/20 122/80         Passed - Last Heart Rate in normal range    Pulse Readings from Last 1 Encounters:  02/25/20 86         Passed - Valid encounter within last 6 months    Recent Outpatient Visits          1 week ago Degenerative disc disease, lumbar   [facility] [place], MD   3 months ago Essential hypertension   [facility] at [facility], Arlie Solomons, MD   4 months ago Essential hypertension   [facility] at [facility], Arlie Solomons, MD   7 months ago Urinary frequency   Primary Care at [facility], Arlie Solomons, MD   9 months ago Urinary frequency   [facility] at [facility], Ranell Patrick, MD

## 2020-03-05 DIAGNOSIS — Z23 Encounter for immunization: Secondary | ICD-10-CM | POA: Diagnosis not present

## 2020-03-08 ENCOUNTER — Telehealth: Payer: Self-pay | Admitting: Family Medicine

## 2020-03-08 NOTE — Telephone Encounter (Signed)
Please have the provider sign the ovc note so the patient's referral can be completed.

## 2020-03-08 NOTE — Progress Notes (Signed)
Subjective: [name] presents today for follow up of painful mycotic nails b/l that are difficult to trim. Pain interferes with ambulation. Aggravating factors include wearing enclosed shoe gear. Pain is relieved with periodic professional debridement.   No Known Allergies   Objective: There were no vitals filed for this visit.  Pt 86 y.o. year old female  in NAD. AAO x 3.   Vascular Examination:  Capillary refill time to digits immediate b/l. Palpable DP pulses b/l. Palpable PT pulses b/l. Pedal hair absent b/l Skin temperature gradient within normal limits b/l.  Dermatological Examination: Pedal skin with normal turgor, texture and tone bilaterally. No open wounds bilaterally. No interdigital macerations bilaterally. Toenails 1-5 b/l elongated, dystrophic, thickened, crumbly with subungual debris and tenderness to dorsal palpation.  Musculoskeletal: Normal muscle strength 5/5 to all lower extremity muscle groups bilaterally, no pain crepitus or joint limitation noted with ROM b/l, bunion deformity noted b/l and hammertoes noted to the  2-5 bilaterally and overlapping hammertoes b/l 2nd digits  Neurological: Protective sensation intact 5/5 intact bilaterally with 10g monofilament b/l Vibratory sensation intact b/l  Assessment: 1. Pain due to onychomycosis of toenail    Plan: -Toenails 1-5 b/l were debrided in length and girth with sterile nail nippers and dremel without iatrogenic bleeding.  -Patient to continue soft, supportive shoe gear daily. -Patient to report any pedal injuries to medical professional immediately. -Patient/POA to call should there be question/concern in the interim.  Return in about 3 months (around 06/01/2020) for nail and callus trim.

## 2020-03-08 NOTE — Telephone Encounter (Signed)
Acknowledged and provider will sign note.

## 2020-03-17 DIAGNOSIS — M5136 Other intervertebral disc degeneration, lumbar region: Secondary | ICD-10-CM | POA: Diagnosis not present

## 2020-04-01 ENCOUNTER — Telehealth: Payer: Self-pay | Admitting: Family Medicine

## 2020-04-01 ENCOUNTER — Ambulatory Visit (INDEPENDENT_AMBULATORY_CARE_PROVIDER_SITE_OTHER): Payer: Medicare Other | Admitting: Family Medicine

## 2020-04-01 VITALS — BP 122/80 | Wt 118.0 lb

## 2020-04-01 DIAGNOSIS — Z0000 Encounter for general adult medical examination without abnormal findings: Secondary | ICD-10-CM

## 2020-04-01 NOTE — Progress Notes (Signed)
Presents today for [employer] Visit   Date of last exam: 02-25-2020  Interpreter used for this visit? No  I connected with  [name] on 04/01/20 by a telephone  and verified that I am speaking with the correct person using two identifiers.   I discussed the limitations of evaluation and management by telemedicine. The patient expressed understanding and agreed to proceed.   Patient Care Team: Forrest Moron, MD as PCP - General (Internal Medicine) Fay Records, MD (Cardiology) Druscilla Brownie, MD (Dermatology) [name], MD (Inactive) (Gastroenterology) Verdis Frederickson, MD (Inactive) (Obstetrics and Gynecology) Clent Jacks, MD (Ophthalmology) Roseanne Kaufman, MD (Orthopedic Surgery)   Other items to address today:   Discussed Eye/Dental Mailed AVS    Other Screening: Last screening for diabetes: 11/03/2019 Last lipid screening: 11/92020  ADVANCE DIRECTIVES: Discussed: yes On File: no Materials Provided: yes  Immunization status:  Immunization History  Administered Date(s) Administered  . Influenza Split 10/12/2011  . Influenza Whole 09/20/2012  . Influenza, High Dose Seasonal PF 02/21/2019, 08/26/2019  . Influenza,inj,Quad PF,6+ Mos 10/07/2013, 09/03/2014, 08/26/2015, 10/11/2016  . Influenza-Unspecified 08/25/2017  . Moderna SARS-COVID-2 Vaccination 02/06/2020  . PPD Test 10/16/2018  . Pneumococcal Conjugate-13 07/21/2014  . Pneumococcal Polysaccharide-23 12/24/2001, 12/24/2006, 11/04/2016  . Tdap 07/15/2007, 04/28/2014  . Zoster 01/02/2018  . Zoster Recombinat (Shingrix) 01/02/2018, 10/06/2019     There are no preventive care reminders to display for this patient.   Functional Status Survey: Is the patient deaf or have difficulty hearing?: No Does the patient have difficulty seeing, even when wearing glasses/contacts?: No Does the patient have difficulty concentrating, remembering, or making decisions?: No Does  the patient have difficulty walking or climbing stairs?: No Does the patient have difficulty dressing or bathing?: No Does the patient have difficulty doing errands alone such as visiting a doctor's office or shopping?: No   6CIT Screen 04/01/2020  What Year? 0 points  What month? 0 points  What time? 0 points  Count back from 20 0 points  Months in reverse 0 points  Repeat phrase 2 points  Total Score 2        Clinical Support from 04/01/2020 in [place] at [facility]  AUDIT-C Score  0       Home Environment:   No trouble climbing stairs No scattered rugs Yes grab bars Lives in one story Adequate lighting/no clutter   Patient Active Problem List   Diagnosis Date Noted  . Degenerative disc disease, lumbar 01/01/2018  . Hiatal hernia 11/04/2016  . Acute blood loss anemia   . Gastroesophageal reflux disease without esophagitis 07/31/2014  . Gastric ulcer without hemorrhage or perforation 07/22/2013  . History of syphilis 02/27/2012  . Dementia ([place])   . Spondylosis   . Foot deformity   . Migraine   . CAD (coronary artery disease)   . HSV-2 (herpes simplex virus 2) infection   . Abnormal finding on Pap smear, ASCUS   . Osteoporosis   . CONSTIPATION 12/12/2010  . ANEMIA, IRON DEFICIENCY 06/17/2008  . BARRETTS ESOPHAGUS 06/17/2008  . GLAUCOMA 06/16/2008  . Essential hypertension 06/16/2008  . DIVERTICULOSIS, COLON 06/16/2008  . ARTHRITIS 06/16/2008  . SLEEP APNEA 06/16/2008     Past Medical History:  Diagnosis Date  . Abnormal finding on Pap smear, ASCUS   . Acute renal insufficiency   . Amputation finger    index  . Barrett's esophagus   . CAD (coronary artery disease)   . Dementia (  [facility])   . Diverticulosis   . Elevated BP   . Foot deformity   . GERD (gastroesophageal reflux disease)   . Glaucoma   . H/O: GI bleed   . HSV-2 (herpes simplex virus 2) infection   . Hyperglycemia   . Hypertension   . Migraine   . Osteoporosis    worsening  .  Spondylosis      Past Surgical History:  Procedure Laterality Date  . AMPUTATION FINGER / THUMB     secondary to osteomyelitis  . CATARACT EXTRACTION, BILATERAL Bilateral 12/25/2016  . COLONOSCOPY    . ESOPHAGOGASTRODUODENOSCOPY N/A 10/22/2016   Procedure: ESOPHAGOGASTRODUODENOSCOPY (EGD);  Surgeon: Doran Stabler, MD;  Location: [facility] ENDOSCOPY;  Service: Endoscopy;  Laterality: N/A;  . FOOT SURGERY     right  . PARTIAL HYSTERECTOMY    . UPPER GASTROINTESTINAL ENDOSCOPY       Family History  Problem Relation Age of Onset  . Breast cancer Mother   . Heart attack Mother   . Heart disease Father   . Alcohol abuse Son   . Colon cancer Neg Hx   . Stomach cancer Neg Hx   . Rectal cancer Neg Hx      Social History   Socioeconomic History  . Marital status: Single    Spouse name: Not on file  . Number of children: 1  . Years of education: Not on file  . Highest education level: 12th grade  Occupational History    Employer: RETIRED  Tobacco Use  . Smoking status: Never Smoker  . Smokeless tobacco: Never Used  Substance and Sexual Activity  . Alcohol use: No    Alcohol/week: 0.0 standard drinks  . Drug use: No  . Sexual activity: Never    Birth control/protection: Post-menopausal  Other Topics Concern  . Not on file  Social History Narrative   Marital status: divorced.      Children: 1 son; no grandchildren      Lives: with son      Employment: retired      Tobacco: none      Alcohol: none      Drugs: none       Exercise: none       ADLs:  Independent with ADLs.  Quit driving age 39.  Medical CNA cleans house; makes breakfast; patient pays bills.      Advanced Directives:   YES.  HCPOA:  Son/Isaac Bowman.  FULL CODE; no prolonged measures.   Social Determinants of Health   Financial Resource Strain:   . Difficulty of Paying Living Expenses:   Food Insecurity:   . Worried About Charity fundraiser in the Last Year:   . Arboriculturist in the Last Year:    Transportation Needs:   . Film/video editor (Medical):   Marland Kitchen Lack of Transportation (Non-Medical):   Physical Activity:   . Days of Exercise per Week:   . Minutes of Exercise per Session:   Stress:   . Feeling of Stress :   Social Connections:   . Frequency of Communication with Friends and Family:   . Frequency of Social Gatherings with Friends and Family:   . Attends Religious Services:   . Active Member of Clubs or Organizations:   . Attends Archivist Meetings:   Marland Kitchen Marital Status:   Intimate Partner Violence:   . Fear of Current or Ex-Partner:   . Emotionally Abused:   Marland Kitchen Physically Abused:   .  Sexually Abused:      No Known Allergies   Prior to Admission medications   Medication Sig Start Date End Date Taking? Authorizing Provider  Calcium Carbonate-Vitamin D (CALCIUM-VITAMIN D) 500-200 MG-UNIT tablet Take 3 tablets by mouth every morning.   Yes [provider]  Elastic Bandages & Supports (LUMBAR BACK BRACE/SUPPORT PAD) MISC Dispense one back brace to be worn while doing activities as needed daily 11/03/19  Yes Stallings, Zoe A, MD  ESTRACE VAGINAL 0.1 MG/GM vaginal cream INSERT 1 APPLICATORFUL VAGINALLY ONCE A WEEK 07/15/19  Yes Stallings, Zoe A, MD  FEROSUL 325 (65 Fe) MG tablet TK 1 T PO QD 05/01/19  Yes [provider]  FEROSUL 325 (65 Fe) MG tablet TAKE 1 TABLET(325 MG) BY MOUTH DAILY 01/12/20  Yes Stallings, Zoe A, MD  gabapentin (NEURONTIN) 800 MG tablet TAKE 1 TABLET(800 MG) BY MOUTH AT BEDTIME 12/29/19  Yes Stallings, Zoe A, MD  hydrochlorothiazide (MICROZIDE) 12.5 MG capsule TAKE 1 CAPSULE BY MOUTH EVERY TUESDAY AND SATURDAY 11/03/19  Yes Stallings, Zoe A, MD  hydrocortisone 2.5 % ointment APPLY EXTERNALLY TO THE AFFECTED AREA TWICE DAILY 05/31/18  Yes Wardell Honour, MD  hydroxypropyl methylcellulose (ISOPTO TEARS) 2.5 % ophthalmic solution Place 1 drop into both eyes 2 (two) times daily as needed for dry eyes.    Yes [provider]  lactose free nutrition (BOOST) LIQD Take 237 mLs by mouth 2 (two) times daily between meals. Patient taking differently: Take 1 Container by mouth 2 (two) times daily as needed (nutritional).  03/22/16  Yes [name], MD  latanoprost (XALATAN) 0.005 % ophthalmic solution INT 1 GTT IN OD QD 08/26/19  Yes [provider]  lidocaine (LIDODERM) 5 % Place 1 patch onto the skin daily. Remove & Discard patch within 12 hours or as directed by MD.  Apply to the right side of your neck where it hurts 05/25/18  Yes Blanchie Dessert, MD  methocarbamol (ROBAXIN) 500 MG tablet Take 1 tablet (500 mg total) by mouth daily as needed for muscle spasms. After doing yard work. 11/03/19  Yes Stallings, Zoe A, MD  metoprolol succinate (TOPROL-XL) 50 MG 24 hr tablet TAKE 1 TABLET BY MOUTH EVERY DAY WITH OR IMMEDIATELY FOLLOWING A MEAL 03/03/20  Yes Stallings, Zoe A, MD  nystatin cream (MYCOSTATIN) Apply to external affected area twice a day for 10 days as needed. 11/08/19  Yes Stallings, Zoe A, MD  ofloxacin (OCUFLOX) 0.3 % ophthalmic solution 10 drops in right ear once daily for 5 days. 01/29/19  Yes Yu, Amy V, PA-C  pantoprazole (PROTONIX) 40 MG tablet Take 1 tablet (40 mg total) by mouth 2 (two) times daily. 11/03/19  Yes Forrest Moron, MD  PREMARIN vaginal cream  10/10/19  Yes [provider]  promethazine (PHENERGAN) 12.5 MG tablet take 1 tablet by mouth every 6 hours if needed for nausea and vomiting 11/03/19  Yes Stallings, Zoe A, MD  sucralfate (CARAFATE) 1 g tablet Take 1 tablet (1 g total) by mouth every 6 (six) hours as needed. Slowly dissolve tablet in 1 Tablespoon of distilled water for 15 minutes before taking (do not crush) 07/15/19  Yes Armbruster, Carlota Raspberry, MD  traMADol-acetaminophen (ULTRACET) 37.5-325 MG tablet Take 1 tablet by mouth every 6 (six) hours. 03/03/20  Yes Stallings, Zoe A, MD  triamcinolone cream (KENALOG) 0.1 % Apply topically 2 (two) times daily. 09/04/18  Yes Stallings, Zoe  A, MD  verapamil (CALAN-SR) 240 MG CR tablet Take  1 tablet (240 mg total) by mouth daily. 11/03/19  Yes Forrest Moron, MD  vitamin C (ASCORBIC ACID) 500 MG tablet Take 500 mg by mouth daily.   Yes [provider]  ZINC OXIDE, TOPICAL, 10 % CREA 1 application to affected area to prevent moisture and further irritation of skin. 01/29/19  Yes Ok Edwards, PA-C     Depression screen [facility] 2/9 04/01/2020 11/24/2019 11/03/2019 07/23/2019 07/16/2019  Decreased Interest 0 0 0 0 0  Down, Depressed, Hopeless 0 0 0 1 0  PHQ - 2 Score 0 0 0 1 0  Altered sleeping - - - - -  Tired, decreased energy - - - - -  Change in appetite - - - - -  Feeling bad or failure about yourself  - - - - -  Trouble concentrating - - - - -  Moving slowly or fidgety/restless - - - - -  Suicidal thoughts - - - - -  PHQ-9 Score - - - - -  Difficult doing work/chores - - - - -  Some recent data might be hidden     Fall Risk  04/01/2020 11/24/2019 11/03/2019 07/16/2019 05/14/2019  Falls in the past year? 0 0 0 0 0  Number falls in past yr: 0 0 0 0 0  Injury with Fall? 0 0 0 0 0  Follow up Falls evaluation completed Falls evaluation completed - Falls evaluation completed -      PHYSICAL EXAM: BP 122/80 Comment: taken from a previous visit  Wt 118 lb (53.5 kg)   BMI 24.66 kg/m    Wt Readings from Last 3 Encounters:  04/01/20 118 lb (53.5 kg)  02/25/20 118 lb (53.5 kg)  11/24/19 106 lb 9.6 oz (48.4 kg)       Education/Counseling provided regarding diet and exercise, prevention of chronic diseases, smoking/tobacco cessation, if applicable, and reviewed "Covered Medicare Preventive Services."

## 2020-04-01 NOTE — Telephone Encounter (Signed)
Called pt lvmtcb to sch 3 month f/u and do a TOC with one of our providers. Stalling pt the 3 month and TOC can be in one appt.

## 2020-04-01 NOTE — Patient Instructions (Signed)
Thank you for taking time to come for your Medicare Wellness Visit. I appreciate your ongoing commitment to your health goals. Please review the following plan we discussed and let me know if I can assist you in the future.  [name] Kennedy LPN Preventive Care 84 Years and Older, Female Preventive care refers to lifestyle choices and visits with your health care provider that can promote health and wellness. This includes:  A yearly physical exam. This is also called an annual well check.  Regular dental and eye exams.  Immunizations.  Screening for certain conditions.  Healthy lifestyle choices, such as diet and exercise. What can I expect for my preventive care visit? Physical exam Your health care provider will check:  Height and weight. These may be used to calculate body mass index (BMI), which is a measurement that tells if you are at a healthy weight.  Heart rate and blood pressure.  Your skin for abnormal spots. Counseling Your health care provider may ask you questions about:  Alcohol, tobacco, and drug use.  Emotional well-being.  Home and relationship well-being.  Sexual activity.  Eating habits.  History of falls.  Memory and ability to understand (cognition).  Work and work Statistician.  Pregnancy and menstrual history. What immunizations do I need?  Influenza (flu) vaccine  This is recommended every year. Tetanus, diphtheria, and pertussis (Tdap) vaccine  You may need a Td booster every 10 years. Varicella (chickenpox) vaccine  You may need this vaccine if you have not already been vaccinated. Zoster (shingles) vaccine  You may need this after age 74. Pneumococcal conjugate (PCV13) vaccine  One dose is recommended after age 80. Pneumococcal polysaccharide (PPSV23) vaccine  One dose is recommended after age 91. Measles, mumps, and rubella (MMR) vaccine  You may need at least one dose of MMR if you were born in 1957 or later. You may also need  a second dose. Meningococcal conjugate (MenACWY) vaccine  You may need this if you have certain conditions. Hepatitis A vaccine  You may need this if you have certain conditions or if you travel or work in places where you may be exposed to hepatitis A. Hepatitis B vaccine  You may need this if you have certain conditions or if you travel or work in places where you may be exposed to hepatitis B. Haemophilus influenzae type b (Hib) vaccine  You may need this if you have certain conditions. You may receive vaccines as individual doses or as more than one vaccine together in one shot (combination vaccines). Talk with your health care provider about the risks and benefits of combination vaccines. What tests do I need? Blood tests  Lipid and cholesterol levels. These may be checked every 5 years, or more frequently depending on your overall health.  Hepatitis C test.  Hepatitis B test. Screening  Lung cancer screening. You may have this screening every year starting at age 84 if you have a 30-pack-year history of smoking and currently smoke or have quit within the past 15 years.  Colorectal cancer screening. All adults should have this screening starting at age 2 and continuing until age 34. Your health care provider may recommend screening at age 39 if you are at increased risk. You will have tests every 1-10 years, depending on your results and the type of screening test.  Diabetes screening. This is done by checking your blood sugar (glucose) after you have not eaten for a while (fasting). You may have this done every 1-3 years.  Mammogram. This may be done every 1-2 years. Talk with your health care provider about how often you should have regular mammograms.  BRCA-related cancer screening. This may be done if you have a family history of breast, ovarian, tubal, or peritoneal cancers. Other tests  Sexually transmitted disease (STD) testing.  Bone density scan. This is done to  screen for osteoporosis. You may have this done starting at age 65. Follow these instructions at home: Eating and drinking  Eat a diet that includes fresh fruits and vegetables, whole grains, lean protein, and low-fat dairy products. Limit your intake of foods with high amounts of sugar, saturated fats, and salt.  Take vitamin and mineral supplements as recommended by your health care provider.  Do not drink alcohol if your health care provider tells you not to drink.  If you drink alcohol: ? Limit how much you have to 0-1 drink a day. ? Be aware of how much alcohol is in your drink. In the [place] one drink equals one 12 oz bottle of beer (355 mL), one 5 oz glass of wine (148 mL), or one 1 oz glass of hard liquor (44 mL). Lifestyle  Take daily care of your teeth and gums.  Stay active. Exercise for at least 30 minutes on 5 or more days each week.  Do not use any products that contain nicotine or tobacco, such as cigarettes, e-cigarettes, and chewing tobacco. If you need help quitting, ask your health care provider.  If you are sexually active, practice safe sex. Use a condom or other form of protection in order to prevent STIs (sexually transmitted infections).  Talk with your health care provider about taking a low-dose aspirin or statin. What's next?  Go to your health care provider once a year for a well check visit.  Ask your health care provider how often you should have your eyes and teeth checked.  Stay up to date on all vaccines. This information is not intended to replace advice given to you by your health care provider. Make sure you discuss any questions you have with your health care provider. Document Revised: 12/05/2018 Document Reviewed: 12/05/2018 Elsevier Patient Education  2020 [employer].

## 2020-04-08 ENCOUNTER — Telehealth: Payer: Self-pay | Admitting: Family Medicine

## 2020-04-08 NOTE — Telephone Encounter (Signed)
Patient called asking for Dr Kaleen Mask to call her before she leaves if possible She has a TOC set up with Dr.Greene 05/17/2020

## 2020-04-08 NOTE — Telephone Encounter (Signed)
Please advise 

## 2020-04-16 ENCOUNTER — Encounter: Payer: Self-pay | Admitting: Family Medicine

## 2020-04-16 ENCOUNTER — Ambulatory Visit (INDEPENDENT_AMBULATORY_CARE_PROVIDER_SITE_OTHER): Payer: Medicare Other | Admitting: Family Medicine

## 2020-04-16 ENCOUNTER — Other Ambulatory Visit: Payer: Self-pay

## 2020-04-16 VITALS — BP 136/68 | HR 90 | Temp 98.0°F | Wt 120.0 lb

## 2020-04-16 DIAGNOSIS — K5903 Drug induced constipation: Secondary | ICD-10-CM | POA: Diagnosis not present

## 2020-04-16 DIAGNOSIS — K219 Gastro-esophageal reflux disease without esophagitis: Secondary | ICD-10-CM

## 2020-04-16 DIAGNOSIS — T402X5A Adverse effect of other opioids, initial encounter: Secondary | ICD-10-CM | POA: Diagnosis not present

## 2020-04-16 DIAGNOSIS — I10 Essential (primary) hypertension: Secondary | ICD-10-CM

## 2020-04-16 DIAGNOSIS — R350 Frequency of micturition: Secondary | ICD-10-CM

## 2020-04-16 LAB — POCT URINALYSIS DIP (MANUAL ENTRY)
Bilirubin, UA: NEGATIVE
Blood, UA: NEGATIVE
Glucose, UA: NEGATIVE mg/dL
Ketones, POC UA: NEGATIVE mg/dL
Nitrite, UA: NEGATIVE
Protein Ur, POC: NEGATIVE mg/dL
Spec Grav, UA: 1.015 (ref 1.010–1.025)
Urobilinogen, UA: 0.2 E.U./dL
pH, UA: 5.5 (ref 5.0–8.0)

## 2020-04-16 NOTE — Patient Instructions (Addendum)
   For constipation -  Use mineral oil one tablespoon 2-3 times a week.  If you still have trouble with constipation then take colace with the pain medication Tramadol.   Tramadol can cause constipation so take the tramadol with the colace to help with the pain.      If you have lab work done today you will be contacted with your lab results within the next 2 weeks.  If you have not heard from [place] then please contact us. The fastest way to get your results is to register for My Chart.   IF you received an x-ray today, you will receive an invoice from [facility]. Please contact [facility] at [phone] with questions or concerns regarding your invoice.   IF you received labwork today, you will receive an invoice from [place]. Please contact [facility] at [phone] with questions or concerns regarding your invoice.   Our billing staff will not be able to assist you with questions regarding bills from these companies.  You will be contacted with the lab results as soon as they are available. The fastest way to get your results is to activate your My Chart account. Instructions are located on the last page of this paperwork. If you have not heard from [place] regarding the results in 2 weeks, please contact this office.

## 2020-04-16 NOTE — Progress Notes (Signed)
Established Patient Office Visit  Subjective:  Patient ID: [name], female    DOB: Sep 09, 1933  Age: 86 y.o. MRN: [id number]  CC:  Chief Complaint  Patient presents with  . stomach issues    Pt stated that she has been having some stomach issues for the past 3 days with film coming back up. Its not no food just film and she feels like she has indigestion. She also stated that she is constipated and having frequent urination    HPI [name] presents for  Constipation This is a chronic issue that the patient is still struggling with. She has to manually disimpact herself.  She does not routinely take her prunes or mineral oil.  She waits until she is constipated.  She would like colace.   She reports that she continues to have frequent episodes of heart burn. She takes her PPI but not the sucralfate. She denies any increased heart burn but that it comes and goes  Past Medical History:  Diagnosis Date  . Abnormal finding on Pap smear, ASCUS   . Acute renal insufficiency   . Amputation finger    index  . Barrett's esophagus   . CAD (coronary artery disease)   . Dementia ([place])   . Diverticulosis   . Elevated BP   . Foot deformity   . GERD (gastroesophageal reflux disease)   . Glaucoma   . H/O: GI bleed   . HSV-2 (herpes simplex virus 2) infection   . Hyperglycemia   . Hypertension   . Migraine   . Osteoporosis    worsening  . Spondylosis     Past Surgical History:  Procedure Laterality Date  . AMPUTATION FINGER / THUMB     secondary to osteomyelitis  . CATARACT EXTRACTION, BILATERAL Bilateral 12/25/2016  . COLONOSCOPY    . ESOPHAGOGASTRODUODENOSCOPY N/A 10/22/2016   Procedure: ESOPHAGOGASTRODUODENOSCOPY (EGD);  Surgeon: Doran Stabler, MD;  Location: [facility] ENDOSCOPY;  Service: Endoscopy;  Laterality: N/A;  . FOOT SURGERY     right  . PARTIAL HYSTERECTOMY    . UPPER GASTROINTESTINAL ENDOSCOPY      Family History  Problem Relation Age of Onset  . Breast  cancer Mother   . Heart attack Mother   . Heart disease Father   . Alcohol abuse Son   . Colon cancer Neg Hx   . Stomach cancer Neg Hx   . Rectal cancer Neg Hx     Social History   Socioeconomic History  . Marital status: Single    Spouse name: Not on file  . Number of children: 1  . Years of education: Not on file  . Highest education level: 12th grade  Occupational History    Employer: RETIRED  Tobacco Use  . Smoking status: Never Smoker  . Smokeless tobacco: Never Used  Substance and Sexual Activity  . Alcohol use: No    Alcohol/week: 0.0 standard drinks  . Drug use: No  . Sexual activity: Never    Birth control/protection: Post-menopausal  Other Topics Concern  . Not on file  Social History Narrative   Marital status: divorced.      Children: 1 son; no grandchildren      Lives: with son      Employment: retired      Tobacco: none      Alcohol: none      Drugs: none       Exercise: none       ADLs:  Independent  with ADLs.  Quit driving age 31.  Medical CNA cleans house; makes breakfast; patient pays bills.      Advanced Directives:   YES.  HCPOA:  Son/[name].  FULL CODE; no prolonged measures.   Social Determinants of Health   Financial Resource Strain:   . Difficulty of Paying Living Expenses:   Food Insecurity:   . Worried About Charity fundraiser in the Last Year:   . Arboriculturist in the Last Year:   Transportation Needs:   . Film/video editor (Medical):   Marland Kitchen Lack of Transportation (Non-Medical):   Physical Activity:   . Days of Exercise per Week:   . Minutes of Exercise per Session:   Stress:   . Feeling of Stress :   Social Connections:   . Frequency of Communication with Friends and Family:   . Frequency of Social Gatherings with Friends and Family:   . Attends Religious Services:   . Active Member of Clubs or Organizations:   . Attends Archivist Meetings:   Marland Kitchen Marital Status:   Intimate Partner Violence:   . Fear of  Current or Ex-Partner:   . Emotionally Abused:   Marland Kitchen Physically Abused:   . Sexually Abused:     Outpatient Medications Prior to Visit  Medication Sig Dispense Refill  . Calcium Carbonate-Vitamin D (CALCIUM-VITAMIN D) 500-200 MG-UNIT tablet Take 3 tablets by mouth every morning.    Water engineer Bandages & Supports (LUMBAR BACK BRACE/SUPPORT PAD) MISC Dispense one back brace to be worn while doing activities as needed daily 1 each 1  . ESTRACE VAGINAL 0.1 MG/GM vaginal cream INSERT 1 APPLICATORFUL VAGINALLY ONCE A WEEK 42.5 g 0  . FEROSUL 325 (65 Fe) MG tablet TAKE 1 TABLET(325 MG) BY MOUTH DAILY 30 tablet 0  . gabapentin (NEURONTIN) 800 MG tablet TAKE 1 TABLET(800 MG) BY MOUTH AT BEDTIME 90 tablet 1  . hydrochlorothiazide (MICROZIDE) 12.5 MG capsule TAKE 1 CAPSULE BY MOUTH EVERY TUESDAY AND SATURDAY 90 capsule 3  . hydrocortisone 2.5 % ointment APPLY EXTERNALLY TO THE AFFECTED AREA TWICE DAILY 28.35 g 0  . hydroxypropyl methylcellulose (ISOPTO TEARS) 2.5 % ophthalmic solution Place 1 drop into both eyes 2 (two) times daily as needed for dry eyes.     Marland Kitchen lactose free nutrition (BOOST) LIQD Take 237 mLs by mouth 2 (two) times daily between meals. (Patient taking differently: Take 1 Container by mouth 2 (two) times daily as needed (nutritional). ) 60 Can 11  . latanoprost (XALATAN) 0.005 % ophthalmic solution INT 1 GTT IN OD QD    . lidocaine (LIDODERM) 5 % Place 1 patch onto the skin daily. Remove & Discard patch within 12 hours or as directed by MD.  Apply to the right side of your neck where it hurts 30 patch 0  . metoprolol succinate (TOPROL-XL) 50 MG 24 hr tablet TAKE 1 TABLET BY MOUTH EVERY DAY WITH OR IMMEDIATELY FOLLOWING A MEAL 90 tablet 1  . nystatin cream (MYCOSTATIN) Apply to external affected area twice a day for 10 days as needed. 30 g 1  . ofloxacin (OCUFLOX) 0.3 % ophthalmic solution 10 drops in right ear once daily for 5 days. 5 mL 0  . pantoprazole (PROTONIX) 40 MG tablet Take 1 tablet  (40 mg total) by mouth 2 (two) times daily. 180 tablet 1  . PREMARIN vaginal cream     . promethazine (PHENERGAN) 12.5 MG tablet take 1 tablet by mouth every 6 hours  if needed for nausea and vomiting 30 tablet 6  . sucralfate (CARAFATE) 1 g tablet Take 1 tablet (1 g total) by mouth every 6 (six) hours as needed. Slowly dissolve tablet in 1 Tablespoon of distilled water for 15 minutes before taking (do not crush) 120 tablet 1  . triamcinolone cream (KENALOG) 0.1 % Apply topically 2 (two) times daily. 30 g 1  . verapamil (CALAN-SR) 240 MG CR tablet Take 1 tablet (240 mg total) by mouth daily. 90 tablet 3  . vitamin C (ASCORBIC ACID) 500 MG tablet Take 500 mg by mouth daily.    Marland Kitchen ZINC OXIDE, TOPICAL, 10 % CREA 1 application to affected area to prevent moisture and further irritation of skin. 50 g 0  . FEROSUL 325 (65 Fe) MG tablet TK 1 T PO QD    . methocarbamol (ROBAXIN) 500 MG tablet Take 1 tablet (500 mg total) by mouth daily as needed for muscle spasms. After doing yard work. 30 tablet 1  . traMADol-acetaminophen (ULTRACET) 37.5-325 MG tablet Take 1 tablet by mouth every 6 (six) hours. 60 tablet 0   No facility-administered medications prior to visit.    No Known Allergies  ROS Review of Systems See hpi Review of Systems  Constitutional: Negative for activity change, appetite change, chills and fever.  HENT: Negative for congestion, nosebleeds, trouble swallowing and voice change.   Respiratory: Negative for cough, shortness of breath and wheezing.   Gastrointestinal: Negative for diarrhea, nausea and vomiting. +constipation Genitourinary: Negative for difficulty urinating, dysuria, flank pain and hematuria. +increased frequency Musculoskeletal: Negative for back pain, joint swelling and neck pain.  Neurological: Negative for dizziness, speech difficulty, light-headedness and numbness.  See HPI. All other review of systems negative.     Objective:    Physical Exam  BP 136/68 (BP  Location: Right Arm, Patient Position: Sitting, Cuff Size: Normal)   Pulse 90   Temp 98 F (36.7 C) (Temporal)   Wt 120 lb (54.4 kg)   SpO2 95%   BMI 25.08 kg/m  Wt Readings from Last 3 Encounters:  04/16/20 120 lb (54.4 kg)  04/01/20 118 lb (53.5 kg)  02/25/20 118 lb (53.5 kg)   Physical Exam  Constitutional: Oriented to person, place, and time. Appears well-developed and well-nourished.  HENT:  Head: Normocephalic and atraumatic.  Eyes: Conjunctivae and EOM are normal.  Cardiovascular: Normal rate, regular rhythm, normal heart sounds and intact distal pulses.  No murmur heard. Pulmonary/Chest: Effort normal and breath sounds normal. No stridor. No respiratory distress. Has no wheezes.  Abdomen: nondistended, normoactive bs, soft, nontender Neurological: Is alert and oriented to person, place, and time.  Skin: Skin is warm. Capillary refill takes less than 2 seconds.  Psychiatric: Has a normal mood and affect. Behavior is normal. Judgment and thought content normal.    Health Maintenance Due  Topic Date Due  . COVID-19 Vaccine (2 - Moderna 2-dose series) 03/05/2020    There are no preventive care reminders to display for this patient.  Lab Results  Component Value Date   TSH 1.060 01/01/2019   Lab Results  Component Value Date   WBC 5.4 11/03/2019   HGB 11.1 11/03/2019   HCT 34.2 11/03/2019   MCV 87 11/03/2019   PLT 402 11/03/2019   Lab Results  Component Value Date   NA 146 (H) 11/03/2019   K 4.7 11/03/2019   CO2 26 11/03/2019   GLUCOSE 99 11/03/2019   BUN 26 11/03/2019   CREATININE 0.91 11/03/2019  BILITOT 0.2 11/03/2019   ALKPHOS 61 11/03/2019   AST 17 11/03/2019   ALT 10 11/03/2019   PROT 6.3 11/03/2019   ALBUMIN 3.7 11/03/2019   CALCIUM 9.7 11/03/2019   ANIONGAP 7 10/22/2016   Lab Results  Component Value Date   CHOL 175 11/03/2019   Lab Results  Component Value Date   HDL 82 11/03/2019   Lab Results  Component Value Date   LDLCALC 78  11/03/2019   Lab Results  Component Value Date   TRIG 79 11/03/2019   Lab Results  Component Value Date   CHOLHDL 2.1 11/03/2019   Lab Results  Component Value Date   HGBA1C 5.4 05/30/2017      Assessment & Plan:   Problem List Items Addressed This Visit      Cardiovascular and Mediastinum   Essential hypertension- BP stable, cpm     Digestive   Gastroesophageal reflux disease without esophagitis  - advised pt to continue her GERD meds    Other Visit Diagnoses    Frequent urination    -  Primary No UTI   Relevant Orders   POCT urinalysis dipstick (Completed)   Therapeutic opioid induced constipation    - discussed ways to treat constipation, given her tramadol use Discussed that colace would make it harder to pass stool.  Advised hydration, fiber and mineral oil.       No orders of the defined types were placed in this encounter.   Follow-up: No follow-ups on file.    Forrest Moron, MD

## 2020-04-26 ENCOUNTER — Ambulatory Visit: Payer: Medicare Other | Admitting: Family Medicine

## 2020-04-28 ENCOUNTER — Telehealth: Payer: Self-pay | Admitting: Family Medicine

## 2020-04-28 ENCOUNTER — Other Ambulatory Visit: Payer: Self-pay

## 2020-04-28 MED ORDER — FEROSUL 325 (65 FE) MG PO TABS: ORAL_TABLET | ORAL | 2 refills | Status: DC

## 2020-04-28 NOTE — Telephone Encounter (Signed)
Pt is calling and needs refill on ferrous 325 mg. [facility] on [address]

## 2020-05-17 ENCOUNTER — Other Ambulatory Visit: Payer: Self-pay

## 2020-05-17 ENCOUNTER — Encounter: Payer: Medicare Other | Admitting: Family Medicine

## 2020-05-17 DIAGNOSIS — M5136 Other intervertebral disc degeneration, lumbar region: Secondary | ICD-10-CM

## 2020-05-17 MED ORDER — TRAMADOL-ACETAMINOPHEN 37.5-325 MG PO TABS: 1.0000 | ORAL_TABLET | Freq: Four times a day (QID) | ORAL | 0 refills | Status: DC

## 2020-05-17 MED ORDER — METHOCARBAMOL 500 MG PO TABS: 500.0000 mg | ORAL_TABLET | Freq: Every day | ORAL | 1 refills | Status: DC | PRN

## 2020-05-18 DIAGNOSIS — Z79891 Long term (current) use of opiate analgesic: Secondary | ICD-10-CM | POA: Diagnosis not present

## 2020-05-18 DIAGNOSIS — M5136 Other intervertebral disc degeneration, lumbar region: Secondary | ICD-10-CM | POA: Diagnosis not present

## 2020-05-31 ENCOUNTER — Ambulatory Visit (INDEPENDENT_AMBULATORY_CARE_PROVIDER_SITE_OTHER): Payer: Medicare Other | Admitting: Podiatry

## 2020-05-31 ENCOUNTER — Encounter: Payer: Self-pay | Admitting: Podiatry

## 2020-05-31 ENCOUNTER — Other Ambulatory Visit: Payer: Self-pay

## 2020-05-31 DIAGNOSIS — I739 Peripheral vascular disease, unspecified: Secondary | ICD-10-CM

## 2020-05-31 DIAGNOSIS — L84 Corns and callosities: Secondary | ICD-10-CM

## 2020-05-31 DIAGNOSIS — B351 Tinea unguium: Secondary | ICD-10-CM | POA: Diagnosis not present

## 2020-05-31 DIAGNOSIS — M79676 Pain in unspecified toe(s): Secondary | ICD-10-CM | POA: Diagnosis not present

## 2020-06-03 ENCOUNTER — Telehealth: Payer: Self-pay | Admitting: Family Medicine

## 2020-06-03 NOTE — Telephone Encounter (Signed)
Office received a fax after hours regarding that pts ankles are swollen. Left ankle is swollen more. Pt states it seems to be improving and doesn't hut as bad. No falls or injuries. Pt takes Water pill. Ankle is also a little red. Pt is able to walk on it but was difficult to. Ankle is sore. Pt would like a nurse to give her a call regarding this. [phone] Please advise.

## 2020-06-03 NOTE — Telephone Encounter (Signed)
Patient was informed to rest, ice,and elevate ankle and if no better to come into the office to be seen.

## 2020-06-04 NOTE — Progress Notes (Signed)
Subjective: [name] is a pleasant 86 y.o. female patient seen today painful mycotic nails b/l that are difficult to trim. Pain interferes with ambulation. Aggravating factors include wearing enclosed shoe gear. Pain is relieved with periodic professional debridement.  Patient Active Problem List   Diagnosis Date Noted  . Degenerative disc disease, lumbar 01/01/2018  . Hiatal hernia 11/04/2016  . Acute blood loss anemia   . Gastroesophageal reflux disease without esophagitis 07/31/2014  . Gastric ulcer without hemorrhage or perforation 07/22/2013  . History of syphilis 02/27/2012  . Dementia ([place])   . Spondylosis   . Foot deformity   . Migraine   . CAD (coronary artery disease)   . HSV-2 (herpes simplex virus 2) infection   . Abnormal finding on Pap smear, ASCUS   . Osteoporosis   . CONSTIPATION 12/12/2010  . ANEMIA, IRON DEFICIENCY 06/17/2008  . BARRETTS ESOPHAGUS 06/17/2008  . GLAUCOMA 06/16/2008  . Essential hypertension 06/16/2008  . DIVERTICULOSIS, COLON 06/16/2008  . ARTHRITIS 06/16/2008  . SLEEP APNEA 06/16/2008   No Known Allergies  Objective: Physical Exam  General: [name] is a pleasant 86 y.o. African American female, in NAD. AAO x 3.   Vascular:  Capillary fill time to digits <3 seconds b/l lower extremities. Faintly palpable DP pulses b/l. Nonpalpable PT pulse(s) b/l lower extremities. Pedal hair absent b/l. Skin temperature gradient within normal limits b/l. No pain with calf compression b/l. Nonpitting edema noted b/l LE.  Dermatological:  Pedal skin is thin shiny, atrophic bilaterally. No open wounds bilaterally. No interdigital macerations bilaterally. Toenails 1-5 b/l elongated, discolored, dystrophic, thickened, crumbly with subungual debris and tenderness to dorsal palpation. Hyperkeratotic lesion(s) R 2nd toe, R 5th toe, submet head 1 left foot and submet head 1 right foot.  No erythema, no edema, no drainage, no flocculence.  Musculoskeletal:   Normal muscle strength 5/5 to all lower extremity muscle groups bilaterally. No pain crepitus or joint limitation noted with ROM b/l. Hallux valgus with bunion deformity noted b/l lower extremities. Hammertoes noted to the 2-5 bilaterally.  Neurological:  Protective sensation intact 5/5 intact bilaterally with 10g monofilament b/l. Vibratory sensation intact b/l. Proprioception intact bilaterally.  Assessment and Plan:  1. Pain due to onychomycosis of toenail   2. Corns and callosities   3. PAD (peripheral artery disease) (HCC)    -Examined patient. -Toenails 1-5 b/l were debrided in length and girth with sterile nail nippers and dremel without iatrogenic bleeding.  -Corn(s) R 2nd toe and R 5th toe and callus(es) submet head 1 left foot and submet head 1 right foot were pared utilizing sterile scalpel blade without incident. Total number debrided =4. -Patient to report any pedal injuries to medical professional immediately. -Patient to continue soft, supportive shoe gear daily. -Patient/POA to call should there be question/concern in the interim.  Return in about 3 months (around 08/31/2020) for nail trim.  Marzetta Board, DPM

## 2020-06-07 DIAGNOSIS — Z1231 Encounter for screening mammogram for malignant neoplasm of breast: Secondary | ICD-10-CM | POA: Diagnosis not present

## 2020-06-07 LAB — HM MAMMOGRAPHY

## 2020-06-10 DIAGNOSIS — R52 Pain, unspecified: Secondary | ICD-10-CM | POA: Diagnosis not present

## 2020-06-11 ENCOUNTER — Telehealth: Payer: Self-pay | Admitting: Family Medicine

## 2020-06-11 NOTE — Telephone Encounter (Signed)
Received a fax early this morning stating that pt is having swelling in her ankle. Please advise.

## 2020-06-16 ENCOUNTER — Ambulatory Visit (INDEPENDENT_AMBULATORY_CARE_PROVIDER_SITE_OTHER): Payer: Medicare Other | Admitting: Registered Nurse

## 2020-06-16 ENCOUNTER — Other Ambulatory Visit: Payer: Self-pay

## 2020-06-16 ENCOUNTER — Encounter: Payer: Self-pay | Admitting: Registered Nurse

## 2020-06-16 VITALS — BP 134/76 | HR 69 | Temp 98.1°F | Resp 18 | Ht <= 58 in | Wt 111.8 lb

## 2020-06-16 DIAGNOSIS — R609 Edema, unspecified: Secondary | ICD-10-CM | POA: Diagnosis not present

## 2020-06-16 DIAGNOSIS — R5383 Other fatigue: Secondary | ICD-10-CM | POA: Diagnosis not present

## 2020-06-16 DIAGNOSIS — R0683 Snoring: Secondary | ICD-10-CM | POA: Diagnosis not present

## 2020-06-16 LAB — POCT URINALYSIS DIP (CLINITEK)
Bilirubin, UA: NEGATIVE
Blood, UA: NEGATIVE
Glucose, UA: NEGATIVE mg/dL
Ketones, POC UA: NEGATIVE mg/dL
Nitrite, UA: POSITIVE — AB
POC PROTEIN,UA: NEGATIVE
Spec Grav, UA: 1.015 (ref 1.010–1.025)
Urobilinogen, UA: 0.2 E.U./dL
pH, UA: 5.5 (ref 5.0–8.0)

## 2020-06-16 NOTE — Progress Notes (Signed)
Acute Office Visit  Subjective:    Patient ID: [name], female    DOB: [birth year], 86 y.o.   MRN: [id number]  Chief Complaint  Patient presents with  . Ankle Pain    patient states she has been having some ankle swelling for about one week she states it was some pain but it not as much pain  . Ear Pain    patient states he can not hear out of right ear    HPI Patient is in today for dependent edema and ear pain  Dependent edema: Onset around one week ago. Steady. Mild. At times uncomfortable. Bilateral and even. No redness. No claudication. No numbness, burning, or tingling. Has been on HCTZ 12.5mg  PO tuesdays and saturdays for some time - unsure of how long.  Ear pain: pressure, trouble hearing. Mostly R ear. Suspects impacted cerumen, has been the case in the past. Requesting referral to audiology for hearing testing and fitting for hearing aids.  Of note: pt somewhat poor historian, easily distracted.  Past Medical History:  Diagnosis Date  . Abnormal finding on Pap smear, ASCUS   . Acute renal insufficiency   . Amputation finger    index  . Barrett's esophagus   . CAD (coronary artery disease)   . Dementia ([place])   . Diverticulosis   . Elevated BP   . Foot deformity   . GERD (gastroesophageal reflux disease)   . Glaucoma   . H/O: GI bleed   . HSV-2 (herpes simplex virus 2) infection   . Hyperglycemia   . Hypertension   . Migraine   . Osteoporosis    worsening  . Spondylosis     Past Surgical History:  Procedure Laterality Date  . AMPUTATION FINGER / THUMB     secondary to osteomyelitis  . CATARACT EXTRACTION, BILATERAL Bilateral 12/25/2016  . COLONOSCOPY    . ESOPHAGOGASTRODUODENOSCOPY N/A 10/22/2016   Procedure: ESOPHAGOGASTRODUODENOSCOPY (EGD);  Surgeon: Doran Stabler, MD;  Location: [facility];  Service: Endoscopy;  Laterality: N/A;  . FOOT SURGERY     right  . PARTIAL HYSTERECTOMY    . UPPER GASTROINTESTINAL ENDOSCOPY      Family History   Problem Relation Age of Onset  . Breast cancer Mother   . Heart attack Mother   . Heart disease Father   . Alcohol abuse Son   . Colon cancer Neg Hx   . Stomach cancer Neg Hx   . Rectal cancer Neg Hx     Social History   Socioeconomic History  . Marital status: Single    Spouse name: Not on file  . Number of children: 1  . Years of education: Not on file  . Highest education level: 12th grade  Occupational History    Employer: RETIRED  Tobacco Use  . Smoking status: Never Smoker  . Smokeless tobacco: Never Used  Vaping Use  . Vaping Use: Never used  Substance and Sexual Activity  . Alcohol use: No    Alcohol/week: 0.0 standard drinks  . Drug use: No  . Sexual activity: Never    Birth control/protection: Post-menopausal  Other Topics Concern  . Not on file  Social History Narrative   Marital status: divorced.      Children: 1 son; no grandchildren      Lives: with son      Employment: retired      Tobacco: none      Alcohol: none      Drugs: none  Exercise: none       ADLs:  Independent with ADLs.  Quit driving age 89.  Medical CNA cleans house; makes breakfast; patient pays bills.      Advanced Directives:   YES.  HCPOA:  Son/Isaac Bowman.  FULL CODE; no prolonged measures.   Social Determinants of Health   Financial Resource Strain:   . Difficulty of Paying Living Expenses: Not on file  Food Insecurity:   . Worried About Charity fundraiser in the Last Year: Not on file  . Ran Out of Food in the Last Year: Not on file  Transportation Needs:   . Lack of Transportation (Medical): Not on file  . Lack of Transportation (Non-Medical): Not on file  Physical Activity:   . Days of Exercise per Week: Not on file  . Minutes of Exercise per Session: Not on file  Stress:   . Feeling of Stress : Not on file  Social Connections:   . Frequency of Communication with Friends and Family: Not on file  . Frequency of Social Gatherings with Friends and Family: Not on  file  . Attends Religious Services: Not on file  . Active Member of Clubs or Organizations: Not on file  . Attends Archivist Meetings: Not on file  . Marital Status: Not on file  Intimate Partner Violence:   . Fear of Current or Ex-Partner: Not on file  . Emotionally Abused: Not on file  . Physically Abused: Not on file  . Sexually Abused: Not on file    Outpatient Medications Prior to Visit  Medication Sig Dispense Refill  . Calcium Carbonate-Vitamin D (CALCIUM-VITAMIN D) 500-200 MG-UNIT tablet Take 3 tablets by mouth every morning.    . calcium-vitamin D (OSCAL WITH D) 500-200 MG-UNIT TABS tablet calcium carbonate 500 mg (1,250 mg)-vitamin D3 200 unit tablet   3 s by oral route.    . diclofenac Sodium (VOLTAREN) 1 % GEL Voltaren 1 % topical gel    . Elastic Bandages & Supports (LUMBAR BACK BRACE/SUPPORT PAD) MISC Dispense one back brace to be worn while doing activities as needed daily 1 each 1  . ESTRACE VAGINAL 0.1 MG/GM vaginal cream INSERT 1 APPLICATORFUL VAGINALLY ONCE A WEEK 42.5 g 0  . FEROSUL 325 (65 Fe) MG tablet TAKE 1 TABLET(325 MG) BY MOUTH DAILY 30 tablet 0  . FEROSUL 325 (65 Fe) MG tablet TK 1 T PO QD 120 tablet 2  . hydrocortisone 2.5 % ointment APPLY EXTERNALLY TO THE AFFECTED AREA TWICE DAILY 28.35 g 0  . hydroxypropyl methylcellulose (ISOPTO TEARS) 2.5 % ophthalmic solution Place 1 drop into both eyes 2 (two) times daily as needed for dry eyes.     Marland Kitchen lactose free nutrition (BOOST) LIQD Take 237 mLs by mouth 2 (two) times daily between meals. (Patient taking differently: Take 1 Container by mouth 2 (two) times daily as needed (nutritional). ) 60 Can 11  . latanoprost (XALATAN) 0.005 % ophthalmic solution INT 1 GTT IN OD QD    . lidocaine (LIDODERM) 5 % Place 1 patch onto the skin daily. Remove & Discard patch within 12 hours or as directed by MD.  Apply to the right side of your neck where it hurts 30 patch 0  . metoprolol succinate (TOPROL-XL) 50 MG 24 hr  tablet TAKE 1 TABLET BY MOUTH EVERY DAY WITH OR IMMEDIATELY FOLLOWING A MEAL 90 tablet 1  . nystatin cream (MYCOSTATIN) Apply to external affected area twice a day for 10 days  as needed. 30 g 1  . ofloxacin (OCUFLOX) 0.3 % ophthalmic solution 10 drops in right ear once daily for 5 days. 5 mL 0  . pantoprazole (PROTONIX) 40 MG tablet Take 1 tablet (40 mg total) by mouth 2 (two) times daily. 180 tablet 1  . PREMARIN vaginal cream     . promethazine (PHENERGAN) 12.5 MG tablet take 1 tablet by mouth every 6 hours if needed for nausea and vomiting 30 tablet 6  . sucralfate (CARAFATE) 1 g tablet Take 1 tablet (1 g total) by mouth every 6 (six) hours as needed. Slowly dissolve tablet in 1 Tablespoon of distilled water for 15 minutes before taking (do not crush) 120 tablet 1  . traMADol (ULTRAM) 50 MG tablet tramadol 50 mg tablet  Take 1 tablet every 6 hours by oral route.    . triamcinolone cream (KENALOG) 0.1 % Apply topically 2 (two) times daily. 30 g 1  . verapamil (CALAN-SR) 240 MG CR tablet Take 1 tablet (240 mg total) by mouth daily. 90 tablet 3  . vitamin C (ASCORBIC ACID) 500 MG tablet Take 500 mg by mouth daily.    Marland Kitchen ZINC OXIDE, TOPICAL, 10 % CREA 1 application to affected area to prevent moisture and further irritation of skin. 50 g 0  . gabapentin (NEURONTIN) 800 MG tablet TAKE 1 TABLET(800 MG) BY MOUTH AT BEDTIME 90 tablet 1  . hydrochlorothiazide (MICROZIDE) 12.5 MG capsule TAKE 1 CAPSULE BY MOUTH EVERY TUESDAY AND SATURDAY 90 capsule 3  . methocarbamol (ROBAXIN) 500 MG tablet Take 1 tablet (500 mg total) by mouth daily as needed for muscle spasms. After doing yard work. 30 tablet 1  . traMADol-acetaminophen (ULTRACET) 37.5-325 MG tablet Take 1 tablet by mouth every 6 (six) hours. 60 tablet 0   No facility-administered medications prior to visit.    No Known Allergies  Review of Systems Per hpi      Objective:    Physical Exam Vitals and nursing note reviewed.  Constitutional:       General: She is not in acute distress.    Appearance: Normal appearance. She is normal weight. She is not ill-appearing, toxic-appearing or diaphoretic.  Cardiovascular:     Rate and Rhythm: Normal rate and regular rhythm.     Pulses: Normal pulses.     Heart sounds: Normal heart sounds. No murmur heard.  No friction rub. No gallop.   Pulmonary:     Effort: Pulmonary effort is normal. No respiratory distress.     Breath sounds: Normal breath sounds. No stridor. No wheezing, rhonchi or rales.  Chest:     Chest wall: No tenderness.  Musculoskeletal:        General: Swelling (ble +1 pitting edema) present.  Skin:    General: Skin is warm and dry.     Capillary Refill: Capillary refill takes less than 2 seconds.     Coloration: Skin is not jaundiced or pale.     Findings: No bruising, erythema, lesion or rash.  Neurological:     General: No focal deficit present.     Mental Status: She is alert and oriented to person, place, and time. Mental status is at baseline.  Psychiatric:        Mood and Affect: Mood normal.        Behavior: Behavior normal.        Thought Content: Thought content normal.        Judgment: Judgment normal.     BP 134/76  Pulse 69   Temp 98.1 F (36.7 C) (Temporal)   Resp 18   Ht 4\' 10"  (1.473 m)   Wt 111 lb 12.8 oz (50.7 kg)   SpO2 97%   BMI 23.37 kg/m  Wt Readings from Last 3 Encounters:  08/12/20 111 lb (50.3 kg)  07/15/20 111 lb (50.3 kg)  06/16/20 111 lb 12.8 oz (50.7 kg)    Health Maintenance Due  Topic Date Due  . COVID-19 Vaccine (2 - Moderna 2-dose series) 03/05/2020    There are no preventive care reminders to display for this patient.   Lab Results  Component Value Date   TSH 2.190 09/10/2020   Lab Results  Component Value Date   WBC 6.0 06/16/2020   HGB 11.8 06/16/2020   HCT 35.8 06/16/2020   MCV 88 06/16/2020   PLT 328 06/16/2020   Lab Results  Component Value Date   NA 142 09/10/2020   K 4.4 09/10/2020   CO2 26  09/10/2020   GLUCOSE 93 09/10/2020   BUN 24 09/10/2020   CREATININE 0.94 09/10/2020   BILITOT <0.2 09/10/2020   ALKPHOS 56 09/10/2020   AST 10 09/10/2020   ALT 7 09/10/2020   PROT 6.1 09/10/2020   ALBUMIN 3.4 (L) 09/10/2020   CALCIUM 9.2 09/10/2020   ANIONGAP 7 10/22/2016   Lab Results  Component Value Date   CHOL 175 11/03/2019   Lab Results  Component Value Date   HDL 82 11/03/2019   Lab Results  Component Value Date   LDLCALC 78 11/03/2019   Lab Results  Component Value Date   TRIG 79 11/03/2019   Lab Results  Component Value Date   CHOLHDL 2.1 11/03/2019   Lab Results  Component Value Date   HGBA1C 5.4 05/30/2017       Assessment & Plan:   Problem List Items Addressed This Visit    None    Visit Diagnoses    Fatigue, unspecified type    -  Primary   Relevant Orders   Comprehensive metabolic panel (Completed)   CBC (Completed)   POCT URINALYSIS DIP (CLINITEK) (Completed)   Dependent edema       Relevant Orders   Brain natriuretic peptide (Completed)   Snoring        Relevant Orders   Brain natriuretic peptide (Completed)       No orders of the defined types were placed in this encounter.  PLAN  Bilateral impacted cerumen removed via lavage by Jerene Pitch, CMA. TMs visible and intact after lavage.  Will increase her HCTZ to three times weekly: M, W, Sat. Return in 1 mo for follow up  Labs collected, will follow up as warranted  Patient encouraged to call clinic with any questions, comments, or concerns.  Maximiano Coss, NP

## 2020-06-16 NOTE — Patient Instructions (Addendum)
[name]-  To address your swelling, please increase your Hydrochlorothiazide (water pill) 12.5mg  to Abanda each week, on Mondays, Wednesdays, and Saturdays  I'd like to see you in 1 month for a follow up  I will call you if there are concerns regarding your lab results.    If you have lab work done today you will be contacted with your lab results within the next 2 weeks.  If you have not heard from [place] then please contact us. The fastest way to get your results is to register for My Chart.   IF you received an x-ray today, you will receive an invoice from [facility]. Please contact [facility] at [phone] with questions or concerns regarding your invoice.   IF you received labwork today, you will receive an invoice from [facility]. Please contact [facility] at [phone] with questions or concerns regarding your invoice.   Our billing staff will not be able to assist you with questions regarding bills from these companies.  You will be contacted with the lab results as soon as they are available. The fastest way to get your results is to activate your My Chart account. Instructions are located on the last page of this paperwork. If you have not heard from [place] regarding the results in 2 weeks, please contact this office.

## 2020-06-17 LAB — CBC
Hematocrit: 35.8 % (ref 34.0–46.6)
Hemoglobin: 11.8 g/dL (ref 11.1–15.9)
MCH: 29 pg (ref 26.6–33.0)
MCHC: 33 g/dL (ref 31.5–35.7)
MCV: 88 fL (ref 79–97)
Platelets: 328 10*3/uL (ref 150–450)
RBC: 4.07 x10E6/uL (ref 3.77–5.28)
RDW: 13.7 % (ref 11.7–15.4)
WBC: 6 10*3/uL (ref 3.4–10.8)

## 2020-06-17 LAB — COMPREHENSIVE METABOLIC PANEL
ALT: 10 IU/L (ref 0–32)
AST: 12 IU/L (ref 0–40)
Albumin/Globulin Ratio: 1.4 (ref 1.2–2.2)
Albumin: 3.7 g/dL (ref 3.6–4.6)
Alkaline Phosphatase: 62 IU/L (ref 48–121)
BUN/Creatinine Ratio: 23 (ref 12–28)
BUN: 21 mg/dL (ref 8–27)
Bilirubin Total: 0.2 mg/dL (ref 0.0–1.2)
CO2: 26 mmol/L (ref 20–29)
Calcium: 9.3 mg/dL (ref 8.7–10.3)
Chloride: 106 mmol/L (ref 96–106)
Creatinine, Ser: 0.91 mg/dL (ref 0.57–1.00)
GFR calc Af Amer: 67 mL/min/{1.73_m2} (ref >59)
GFR calc non Af Amer: 58 mL/min/{1.73_m2} — ABNORMAL LOW (ref >59)
Globulin, Total: 2.7 g/dL (ref 1.5–4.5)
Glucose: 118 mg/dL — ABNORMAL HIGH (ref 65–99)
Potassium: 4.2 mmol/L (ref 3.5–5.2)
Sodium: 145 mmol/L — ABNORMAL HIGH (ref 134–144)
Total Protein: 6.4 g/dL (ref 6.0–8.5)

## 2020-06-17 LAB — BRAIN NATRIURETIC PEPTIDE: BNP: 5.1 pg/mL (ref 0.0–100.0)

## 2020-06-18 ENCOUNTER — Ambulatory Visit: Payer: Self-pay | Admitting: *Deleted

## 2020-06-18 ENCOUNTER — Telehealth: Payer: Self-pay | Admitting: Registered Nurse

## 2020-06-18 NOTE — Progress Notes (Signed)
Letter please -   Labs holding steady, no concerns. Follow up as scheduled with Dr. Carlota Raspberry on 07/15/20  Thanks,  Kathrin Ruddy, NP

## 2020-06-18 NOTE — Telephone Encounter (Signed)
Please advise I know you were thinking about doing a Referral for [name].

## 2020-06-18 NOTE — Telephone Encounter (Signed)
Pt saw richard on 06-16-2020 and had her ears cleaned out and would like to know if she needs hearing aid. Pt is not having trouble hearing.

## 2020-06-18 NOTE — Telephone Encounter (Signed)
   Reason for Disposition . Caller has medicine question only, adult not sick, AND triager answers question    Confirming her understanding of how to take the medication correctly.  Answer Assessment - Initial Assessment Questions 1. NAME of MEDICATION: "What medicine are you calling about?"     The HCTZ 2. QUESTION: "What is your question?" (e.g., medication refill, side effect)     Maximiano Coss saw me on 06/16/2020.  He increased my medication and I want to make sure I'm starting it on the right day. Do I start it on Saturday, tomorrow? I let her know yes she would start taking it tomorrow.   She read the directions and confirmed she was understanding them correctly.   She's to take it 3 times a week on Mondays, Wednesdays and Saturdays.   I let her know that was correct and to start it tomorrow (Saturday). 3. PRESCRIBING HCP: "Who prescribed it?" Reason: if prescribed by specialist, call should be referred to that group.     Richard Orland Mustard 4. SYMPTOMS: "Do you have any symptoms?"     I'm taking it for swelling in my feet. 5. SEVERITY: If symptoms are present, ask "Are they mild, moderate or severe?"     N/A 6. PREGNANCY:  "Is there any chance that you are pregnant?" "When was your last menstrual period?"     N/A due to age  Protocols used: MEDICATION QUESTION CALL-A-AH

## 2020-06-18 NOTE — Telephone Encounter (Signed)
We can withhold audiology referral if she reports that she has had enough hearing improvement with the lavage of her ears.  Thanks,  Kathrin Ruddy, NP

## 2020-06-22 NOTE — Telephone Encounter (Signed)
Called patient to see if her hearing has improved due to the ear lavage.

## 2020-06-23 ENCOUNTER — Telehealth: Payer: Self-pay | Admitting: Family Medicine

## 2020-06-23 NOTE — Telephone Encounter (Signed)
Spoke with [name]. Regarding pt's mediation and he stated that he will send in to pharmacy.

## 2020-06-23 NOTE — Telephone Encounter (Signed)
Pt called and stated that she would like to see if she can get this mediation switched to 3 times a week instead of 2 times a week, due to her legs keep swelling. Wanting to see if the provider thinks if that would help her better. If so pt would need another refill on that medication. Pt would like a call regarding this matter. Please advise.   hydrochlorothiazide (MICROZIDE) 12.5 MG capsule [[id number]]

## 2020-06-23 NOTE — Telephone Encounter (Signed)
Pt called and stated that she would like to see if she can get this mediation switched to 3 times a week instead of 2 times a week, due to her legs keep swelling. Wanting to see if the provider thinks if that would help her better. If so pt would need another refill on that medication. Pt would like a call regarding this matter. Please advise.   hydrochlorothiazide (MICROZIDE) 12.5 MG capsule [[id number]]

## 2020-06-23 NOTE — Telephone Encounter (Signed)
Pt called again about the issue below/ pt stated she received a call from the office/ please advise

## 2020-06-29 ENCOUNTER — Other Ambulatory Visit: Payer: Self-pay

## 2020-06-29 MED ORDER — HYDROCHLOROTHIAZIDE 12.5 MG PO CAPS: ORAL_CAPSULE | ORAL | 3 refills | Status: DC

## 2020-06-29 NOTE — Telephone Encounter (Signed)
Pt was seen 06/16/2020 by Orland Mustard. Filled Rx pt will continue seeing Orland Mustard for now

## 2020-06-29 NOTE — Telephone Encounter (Signed)
Patient checking on the status of medication refill request mentioned below and would like to speak with the nurse today

## 2020-06-30 ENCOUNTER — Ambulatory Visit: Payer: Self-pay

## 2020-06-30 NOTE — Telephone Encounter (Signed)
Regarding HCTZ 12.5 mg , please clarify frequency. It was ordered to take twice weekly, but 6/23 notes say 3 times a week .

## 2020-06-30 NOTE — Telephone Encounter (Signed)
Patient called and says on her bottle of HCTZ it says to take twice a week on Tuesday and Saturday, but in the office on 06/16/20 it's noted to take 3 times a week on Monday, Wednesday and Saturday. She says the swelling is the same as it was in the office on 06/16/20. I advised to take as prescribed on the bottle and I will send this note to the office for the provider to review, then someone from the office will call with the recommendation. She verbalized understanding.    Summary: please advise   Pt is calling and needs clarification on how many time a week she suppose to take hctz. Pt said her feet is still swollen some. Please advise       Reason for Disposition . [1] Caller has NON-URGENT medicine question about med that PCP prescribed AND [2] triager unable to answer question  Answer Assessment - Initial Assessment Questions 1. NAME of MEDICATION: "What medicine are you calling about?"     HCTZ 2. QUESTION: "What is your question?" (e.g., medication refill, side effect)     How often do I take it 3. PRESCRIBING HCP: "Who prescribed it?" Reason: if prescribed by specialist, call should be referred to that group.     Richard Orland Mustard 4. SYMPTOMS: "Do you have any symptoms?"     No 5. SEVERITY: If symptoms are present, ask "Are they mild, moderate or severe?"     N/A 6. PREGNANCY:  "Is there any chance that you are pregnant?" "When was your last menstrual period?"     N/A  Protocols used: MEDICATION QUESTION CALL-A-AH

## 2020-07-02 ENCOUNTER — Telehealth: Payer: Self-pay | Admitting: Family Medicine

## 2020-07-02 ENCOUNTER — Other Ambulatory Visit: Payer: Self-pay | Admitting: Family Medicine

## 2020-07-02 NOTE — Telephone Encounter (Signed)
Pt called in to check on previous refill request / had some questions still about water pill , wants to know if ok to up waterpill? Ankles still a little swollen. Or she will wait till she sees Dr. Carlota Raspberry TOC. But still wanting to know if ok to up it ?   Please advise

## 2020-07-02 NOTE — Telephone Encounter (Signed)
I do see she has an appointment with me scheduled July 22, but I have not seen her since May of last year.  Patient was most recently seen by Kathrin Ruddy and discussed plan for swelling at that time.  I will forward this message to him as he may be more familiar with plan from that visit and if changes in diuretic indicated prior to visit with me.

## 2020-07-02 NOTE — Telephone Encounter (Signed)
Spoke with patient to inform of the increase to HCTZ 12.5 - 3 times a week . [facility] updated .

## 2020-07-02 NOTE — Telephone Encounter (Signed)
Pt called asking if she can increase her water pill due to continuously swollen ankles,  should she wait for her TOC appt to change this or is it okay to temporarily increase dose

## 2020-07-02 NOTE — Telephone Encounter (Signed)
We are increasing frequency to three times weekly for the swelling in her legs.  Thnak you  Kathrin Ruddy, nP

## 2020-07-04 NOTE — Telephone Encounter (Signed)
We had discussed increasing HCTZ to three times weekly - my larger concern is for her memory problems at this time. We had discussed increasing HCTZ to three times weekly with follow up with her already scheduled 7/22 appt - Dr. Carlota Raspberry, may be worthwhile to get your thoughts on her cognition at that time as I not only provided a thorough oral instruction making sure she could repeat this, but written instructions that I had her teach back to me.   Thank you  Kathrin Ruddy, NP

## 2020-07-05 ENCOUNTER — Other Ambulatory Visit: Payer: Self-pay

## 2020-07-05 MED ORDER — HYDROCHLOROTHIAZIDE 12.5 MG PO CAPS: ORAL_CAPSULE | ORAL | 3 refills | Status: DC

## 2020-07-05 NOTE — Telephone Encounter (Signed)
CALLED PT AND SENT IN NEW DIRECTIONS ON HER NEW BOTTLE HCTZ  FOR THREE TIMES WEEKLY

## 2020-07-06 ENCOUNTER — Telehealth: Payer: Self-pay | Admitting: Family Medicine

## 2020-07-06 NOTE — Telephone Encounter (Signed)
Received a fax after hours that pt stated she has questions about her water pill that her feet are still swelling. Please advise.

## 2020-07-06 NOTE — Telephone Encounter (Signed)
Patient has been informed per drs recommendations. She states she is not having any symptoms listed other than ankle swelling and taking hctz 3 times per week. Patient verbalizes  Understanding to call or seek medical attention if having any symptoms.

## 2020-07-06 NOTE — Telephone Encounter (Signed)
Please advice regarding patient still having ankle swelling .

## 2020-07-06 NOTE — Telephone Encounter (Signed)
Noted. Agree.  We can assess this further at upcoming visit.

## 2020-07-06 NOTE — Telephone Encounter (Signed)
Pt will need to be assessed in office. Suggest that she keep her appt with Dr. Carlota Raspberry on 07/15/20. If rapidly worsening, or if she should have chest pain, shob, doe, new headaches, sudden fatigue, or other concerning symptoms, proceed to Urgent Care or ED  Thank you  Kathrin Ruddy, NP

## 2020-07-15 ENCOUNTER — Ambulatory Visit (INDEPENDENT_AMBULATORY_CARE_PROVIDER_SITE_OTHER): Payer: Medicare Other | Admitting: Family Medicine

## 2020-07-15 ENCOUNTER — Other Ambulatory Visit: Payer: Self-pay

## 2020-07-15 VITALS — BP 123/72 | HR 74 | Temp 98.3°F | Ht <= 58 in | Wt 111.0 lb

## 2020-07-15 DIAGNOSIS — R609 Edema, unspecified: Secondary | ICD-10-CM

## 2020-07-15 DIAGNOSIS — M5136 Other intervertebral disc degeneration, lumbar region: Secondary | ICD-10-CM

## 2020-07-15 DIAGNOSIS — K219 Gastro-esophageal reflux disease without esophagitis: Secondary | ICD-10-CM | POA: Diagnosis not present

## 2020-07-15 NOTE — Progress Notes (Signed)
Subjective:  Patient ID: [name], female    DOB: 08/20/1933  Age: 86 y.o. MRN: [id number]  CC:  Chief Complaint  Patient presents with  . Establish Care    as far as her general health pt states she she has some swelling around her ankels. Pt also states she has bad acid reflux and has to eat a bland diet to keep it from having it act up. pt also reports she has bad arthritis in her back.    HPI [name] presents for  Establish care.  Previous patient of Dr. Nolon Rod. History of hypertension, GERD with Barrett's esophagus, prior gastric ulcer, diverticulosis, dementia, CAD, osteoporosis, lumbar degenerative disc disease  Pedal edema Evaluated June 23 with Kathrin Ruddy.  Ankle swelling for about 1 week at that time.  Has been taking hydrochlorothiazide 12.5 mg only on Tuesdays and Saturdays.  She had a normal CBC, normal brain natruretic peptide, normal creatinine, no proteinuria on urinalysis recommended increase to 3 times per week. Waiting on new rx for TID dosing. Has been still taking Tuesday and Saturday. New rx for 3 times per week starts tomorrow. M/w/f dosing.   GERD with history of Barrett's esophagus, prior gastric ulcer/GI bleed. Gastroenterology, Dr. Havery Moros, appointment May 2020. EGD 01/07/2019 with 9 cm hiatal hernia, LA grade C esophagitis and C1 M2 segment of Barrett's esophagus.  Advised to take Protonix 40 mg twice daily, Carafate and stop NSAIDs.  Biopsies consistent with Barrett's without any dysplasia. Previous EGD in January 2017 indicated a questionable Lysbeth Galas lesion and superficial proximal gastric ulceration.  Repeat in October 2017 large hiatal hernia, short segment Barrett's esophagus without any pathology noted to cause bleeding.  Colonoscopy March 2011 with stercoral ulcers in the rectum, diverticulosis, no polyps  Recurrent issue in the past, has discussed with Dr. Nolon Rod in April, was not taking sucralfate at that time, just PPI.  She is  prescribed Protonix 40 mg twice daily Hemoglobin normal June 23. Avoiding tomatoes and watching diet, trigger avoidance has been helpful. Still taking pantoprazole BID. Not taking carafate.  Heartburn doing ok at this time with watching diet.   Chronic back pain: Usually controlled with 1 tramadol per day.  Some chronic constipation with use of tramadol prior. Not taking colace regularly. Bowels moving about every day. Hard stool.  more work in yard - has had to take tramadol twice per day at times.  No falls/injury. Same area of pain in low back.    XR lumbar spine 09/29/2015: IMPRESSION: 1.  No acute osseous abnormality identified in the lumbar spine. 2. Chronic severe lumbar and lower thoracic disc and endplate degeneration superimposed on moderate thoracolumbar scoliosis.  Concern for dementia prior. No current meds.  Feels down at times since losing son few years ago, but not depressed and is doing ok.  6CIT Screen 04/01/2020  What Year? 0 points  What month? 0 points  What time? 0 points  Count back from 20 0 points  Months in reverse 0 points  Repeat phrase 2 points  Total Score 2   Depression screen [facility] 2/9 07/15/2020 07/15/2020 06/16/2020 04/16/2020 04/01/2020  Decreased Interest 0 0 0 0 0  Down, Depressed, Hopeless 1 0 0 0 0  PHQ - 2 Score 1 0 0 0 0  Altered sleeping - - - - -  Tired, decreased energy - - - - -  Change in appetite - - - - -  Feeling bad or failure about yourself  - - - - -  Trouble concentrating - - - - -  Moving slowly or fidgety/restless - - - - -  Suicidal thoughts - - - - -  PHQ-9 Score - - - - -  Difficult doing work/chores - - - - -  Some recent data might be hidden     History Patient Active Problem List   Diagnosis Date Noted  . Degenerative disc disease, lumbar 01/01/2018  . Hiatal hernia 11/04/2016  . Acute blood loss anemia   . Gastroesophageal reflux disease without esophagitis 07/31/2014  . Gastric ulcer without hemorrhage or  perforation 07/22/2013  . History of syphilis 02/27/2012  . Dementia ([place])   . Spondylosis   . Foot deformity   . Migraine   . CAD (coronary artery disease)   . HSV-2 (herpes simplex virus 2) infection   . Abnormal finding on Pap smear, ASCUS   . Osteoporosis   . CONSTIPATION 12/12/2010  . ANEMIA, IRON DEFICIENCY 06/17/2008  . BARRETTS ESOPHAGUS 06/17/2008  . GLAUCOMA 06/16/2008  . Essential hypertension 06/16/2008  . DIVERTICULOSIS, COLON 06/16/2008  . ARTHRITIS 06/16/2008  . SLEEP APNEA 06/16/2008   Past Medical History:  Diagnosis Date  . Abnormal finding on Pap smear, ASCUS   . Acute renal insufficiency   . Amputation finger    index  . Barrett's esophagus   . CAD (coronary artery disease)   . Dementia ([place])   . Diverticulosis   . Elevated BP   . Foot deformity   . GERD (gastroesophageal reflux disease)   . Glaucoma   . H/O: GI bleed   . HSV-2 (herpes simplex virus 2) infection   . Hyperglycemia   . Hypertension   . Migraine   . Osteoporosis    worsening  . Spondylosis    Past Surgical History:  Procedure Laterality Date  . AMPUTATION FINGER / THUMB     secondary to osteomyelitis  . CATARACT EXTRACTION, BILATERAL Bilateral 12/25/2016  . COLONOSCOPY    . ESOPHAGOGASTRODUODENOSCOPY N/A 10/22/2016   Procedure: ESOPHAGOGASTRODUODENOSCOPY (EGD);  Surgeon: Doran Stabler, MD;  Location[facility] ENDOSCOPY;  Service: Endoscopy;  Laterality: N/A;  . FOOT SURGERY     right  . PARTIAL HYSTERECTOMY    . UPPER GASTROINTESTINAL ENDOSCOPY     No Known Allergies Prior to Admission medications   Medication Sig Start Date End Date Taking? Authorizing Provider  Calcium Carbonate-Vitamin D (CALCIUM-VITAMIN D) 500-200 MG-UNIT tablet Take 3 tablets by mouth every morning.   Yes [provider]  calcium-vitamin D (OSCAL WITH D) 500-200 MG-UNIT TABS tablet calcium carbonate 500 mg (1,250 mg)-vitamin D3 200 unit tablet     Yes [provider]  diclofenac Sodium  (VOLTAREN) 1 % GEL Voltaren 1 % topical gel   Yes [provider]  Elastic Bandages & Supports (LUMBAR BACK BRACE/SUPPORT PAD) MISC Dispense one back brace to be worn while doing activities as needed daily 11/03/19  Yes Stallings, Zoe A, MD  ESTRACE VAGINAL 0.1 MG/GM vaginal cream INSERT 1 APPLICATORFUL VAGINALLY ONCE A WEEK 07/15/19  Yes Stallings, Zoe A, MD  FEROSUL 325 (65 Fe) MG tablet TAKE 1 TABLET(325 MG) BY MOUTH DAILY 01/12/20  Yes Delia Chimes A, MD  FEROSUL 325 (65 Fe) MG tablet TK 1 T PO QD 04/28/20  Yes Stallings, Zoe A, MD  gabapentin (NEURONTIN) 800 MG tablet TAKE 1 TABLET(800 MG) BY MOUTH AT BEDTIME 07/02/20  Yes Wendie Agreste, MD  hydrochlorothiazide (MICROZIDE) 12.5 MG capsule TAKE 1 CAPSULE BY MOUTH EVERY Monday Wednesday  AND Friday 07/05/20  Yes Maximiano Coss, NP  hydrocortisone 2.5 % ointment APPLY EXTERNALLY TO THE AFFECTED AREA TWICE DAILY 05/31/18  Yes Wardell Honour, MD  hydroxypropyl methylcellulose (ISOPTO TEARS) 2.5 % ophthalmic solution Place 1 drop into both eyes 2 (two) times daily as needed for dry eyes.    Yes [provider]  lactose free nutrition (BOOST) LIQD Take 237 mLs by mouth 2 (two) times daily between meals. Patient taking differently: Take 1 Container by mouth 2 (two) times daily as needed (nutritional).  03/22/16  Yes [name], MD  latanoprost (XALATAN) 0.005 % ophthalmic solution INT 1 GTT IN OD QD 08/26/19  Yes [provider]  lidocaine (LIDODERM) 5 % Place 1 patch onto the skin daily. Remove & Discard patch within 12 hours or as directed by MD.  Apply to the right side of your neck where it hurts 05/25/18  Yes Blanchie Dessert, MD  methocarbamol (ROBAXIN) 500 MG tablet Take 1 tablet (500 mg total) by mouth daily as needed for muscle spasms. After doing yard work. 05/17/20  Yes Stallings, Zoe A, MD  metoprolol succinate (TOPROL-XL) 50 MG 24 hr tablet TAKE 1 TABLET BY MOUTH EVERY DAY WITH OR IMMEDIATELY FOLLOWING A MEAL 03/03/20   Yes Stallings, Zoe A, MD  nystatin cream (MYCOSTATIN) Apply to external affected area twice a day for 10 days as needed. 11/08/19  Yes Stallings, Zoe A, MD  ofloxacin (OCUFLOX) 0.3 % ophthalmic solution 10 drops in right ear once daily for 5 days. 01/29/19  Yes Yu, Amy V, PA-C  pantoprazole (PROTONIX) 40 MG tablet Take 1 tablet (40 mg total) by mouth 2 (two) times daily. 11/03/19  Yes Forrest Moron, MD  PREMARIN vaginal cream  10/10/19  Yes [provider]  promethazine (PHENERGAN) 12.5 MG tablet take 1 tablet by mouth every 6 hours if needed for nausea and vomiting 11/03/19  Yes Stallings, Zoe A, MD  sucralfate (CARAFATE) 1 g tablet Take 1 tablet (1 g total) by mouth every 6 (six) hours as needed. Slowly dissolve tablet in 1 Tablespoon of distilled water for 15 minutes before taking (do not crush) 07/15/19  Yes Armbruster, Carlota Raspberry, MD  traMADol (ULTRAM) 50 MG tablet tramadol 50 mg tablet  Take 1 tablet every 6 hours by oral route.   Yes [provider]  traMADol-acetaminophen (ULTRACET) 37.5-325 MG tablet Take 1 tablet by mouth every 6 (six) hours. 05/17/20  Yes Stallings, Zoe A, MD  triamcinolone cream (KENALOG) 0.1 % Apply topically 2 (two) times daily. 09/04/18  Yes Stallings, Zoe A, MD  verapamil (CALAN-SR) 240 MG CR tablet Take 1 tablet (240 mg total) by mouth daily. 11/03/19  Yes Forrest Moron, MD  vitamin C (ASCORBIC ACID) 500 MG tablet Take 500 mg by mouth daily.   Yes [provider]  ZINC OXIDE, TOPICAL, 10 % CREA 1 application to affected area to prevent moisture and further irritation of skin. 01/29/19  Yes Ok Edwards, PA-C   Social History   Socioeconomic History  . Marital status: Single    Spouse name: Not on file  . Number of children: 1  . Years of education: Not on file  . Highest education level: 12th grade  Occupational History    Employer: RETIRED  Tobacco Use  . Smoking status: Never Smoker  . Smokeless tobacco: Never Used  Vaping Use  .  Vaping Use: Never used  Substance and Sexual Activity  . Alcohol use: No  Alcohol/week: 0.0 standard drinks  . Drug use: No  . Sexual activity: Never    Birth control/protection: Post-menopausal  Other Topics Concern  . Not on file  Social History Narrative   Marital status: divorced.      Children: 1 son; no grandchildren      Lives: with son      Employment: retired      Tobacco: none      Alcohol: none      Drugs: none       Exercise: none       ADLs:  Independent with ADLs.  Quit driving age 34.  Medical CNA cleans house; makes breakfast; patient pays bills.      Advanced Directives:   YES.  HCPOA:  Son/Isaac Bowman.  FULL CODE; no prolonged measures.   Social Determinants of Health   Financial Resource Strain:   . Difficulty of Paying Living Expenses:   Food Insecurity:   . Worried About Charity fundraiser in the Last Year:   . Arboriculturist in the Last Year:   Transportation Needs:   . Film/video editor (Medical):   Marland Kitchen Lack of Transportation (Non-Medical):   Physical Activity:   . Days of Exercise per Week:   . Minutes of Exercise per Session:   Stress:   . Feeling of Stress :   Social Connections:   . Frequency of Communication with Friends and Family:   . Frequency of Social Gatherings with Friends and Family:   . Attends Religious Services:   . Active Member of Clubs or Organizations:   . Attends Archivist Meetings:   Marland Kitchen Marital Status:   Intimate Partner Violence:   . Fear of Current or Ex-Partner:   . Emotionally Abused:   Marland Kitchen Physically Abused:   . Sexually Abused:     Review of Systems Per HPI  Objective:   Vitals:   07/15/20 1520  BP: 123/72  Pulse: 74  Temp: 98.3 F (36.8 C)  TempSrc: Temporal  SpO2: 95%  Weight: 111 lb (50.3 kg)  Height: 4\' 10"  (1.473 m)     Physical Exam Vitals reviewed.  Constitutional:      Appearance: She is well-developed.  HENT:     Head: Normocephalic and atraumatic.  Eyes:      Conjunctiva/sclera: Conjunctivae normal.     Pupils: Pupils are equal, round, and reactive to light.  Neck:     Vascular: No carotid bruit.  Cardiovascular:     Rate and Rhythm: Normal rate and regular rhythm.     Heart sounds: Normal heart sounds.  Pulmonary:     Effort: Pulmonary effort is normal.     Breath sounds: Normal breath sounds.  Abdominal:     Palpations: Abdomen is soft. There is no pulsatile mass.     Tenderness: There is no abdominal tenderness.  Musculoskeletal:     Right lower leg: Edema (Trace pedal edema to lower third, ankles bilaterally 1+, no focal bony tenderness,, no erythema/ulcerations.  No wounds.) present.     Left lower leg: Edema present.  Skin:    General: Skin is warm and dry.  Neurological:     Mental Status: She is alert and oriented to person, place, and time.  Psychiatric:        Behavior: Behavior normal.        Assessment & Plan:  [name] is a 86 y.o. female . Dependent edema  -Minimal dependent edema, previous testing reassuring.  Plans to start new dosing of hydrochlorothiazide, watch for changes, with RTC precautions.  Degenerative disc disease, lumbar  -Overall stable with use of tramadol, but constipation risk discussed with that medication, Colace/stool softener discussed with RTC precautions.  Fluids and fiber in the diet also discussed.  Gastroesophageal reflux disease without esophagitis  -Has been able to control fairly well with trigger avoidance.  Continue Protonix same dosing for now.  No orders of the defined types were placed in this encounter.  Patient Instructions   Changing hydrochlorothiazide to 3 days per week should help the leg swelling. Your recent tests with [name] looked ok. If swelling not better at new dose of medications in next 2 weeks, return for recheck.  Okay to use tramadol if needed for your back pain but make sure to take Colace stool softener every day to help minimize constipation and hard  stools.  Make sure to drink plenty of fluids.  Continue Protonix twice per day for heartburn.  See if is below that can worsen heartburn, continue to avoid tomato sauce/tomatoes as those seem to have been an issue for you in the past.  Return to the clinic or go to the nearest emergency room if any of your symptoms worsen or new symptoms occur.  Thank you for coming in today.   Food Choices for Gastroesophageal Reflux Disease, Adult When you have gastroesophageal reflux disease (GERD), the foods you eat and your eating habits are very important. Choosing the right foods can help ease your discomfort. Think about working with a nutrition specialist (dietitian) to help you make good choices. What are tips for following this plan?  Meals  Choose healthy foods that are low in fat, such as fruits, vegetables, whole grains, low-fat dairy products, and lean meat, fish, and poultry.  Eat small meals often instead of 3 large meals a day. Eat your meals slowly, and in a place where you are relaxed. Avoid bending over or lying down until 2-3 hours after eating.  Avoid eating meals 2-3 hours before bed.  Avoid drinking a lot of liquid with meals.  Cook foods using methods other than frying. Bake, grill, or broil food instead.  Avoid or limit: ? Chocolate. ? Peppermint or spearmint. ? Alcohol. ? Pepper. ? Black and decaffeinated coffee. ? Black and decaffeinated tea. ? Bubbly (carbonated) soft drinks. ? Caffeinated energy drinks and soft drinks.  Limit high-fat foods such as: ? Fatty meat or fried foods. ? Whole milk, cream, butter, or ice cream. ? Nuts and nut butters. ? Pastries, donuts, and sweets made with butter or shortening.  Avoid foods that cause symptoms. These foods may be different for everyone. Common foods that cause symptoms include: ? Tomatoes. ? Oranges, lemons, and limes. ? Peppers. ? Spicy food. ? Onions and garlic. ? Vinegar. Lifestyle  Maintain a healthy  weight. Ask your doctor what weight is healthy for you. If you need to lose weight, work with your doctor to do so safely.  Exercise for at least 30 minutes for 5 or more days each week, or as told by your doctor.  Wear loose-fitting clothes.  Do not smoke. If you need help quitting, ask your doctor.  Sleep with the head of your bed higher than your feet. Use a wedge under the mattress or blocks under the bed frame to raise the head of the bed. Summary  When you have gastroesophageal reflux disease (GERD), food and lifestyle choices are very important in easing your symptoms.  Eat  small meals often instead of 3 large meals a day. Eat your meals slowly, and in a place where you are relaxed.  Limit high-fat foods such as fatty meat or fried foods.  Avoid bending over or lying down until 2-3 hours after eating.  Avoid peppermint and spearmint, caffeine, alcohol, and chocolate. This information is not intended to replace advice given to you by your health care provider. Make sure you discuss any questions you have with your health care provider. Document Revised: 04/03/2019 Document Reviewed: 01/16/2017 Elsevier Patient Education  [place].  Peripheral Edema  Peripheral edema is swelling that is caused by a buildup of fluid. Peripheral edema most often affects the lower legs, ankles, and feet. It can also develop in the arms, hands, and face. The area of the body that has peripheral edema will look swollen. It may also feel heavy or warm. Your clothes may start to feel tight. Pressing on the area may make a temporary dent in your skin. You may not be able to move your swollen arm or leg as much as usual. There are many causes of peripheral edema. It can happen because of a complication of other conditions such as congestive heart failure, kidney disease, or a problem with your blood circulation. It also can be a side effect of certain medicines or because of an infection. It often  happens to women during pregnancy. Sometimes, the cause is not known. Follow these instructions at home: Managing pain, stiffness, and swelling   Raise (elevate) your legs while you are sitting or lying down.  Move around often to prevent stiffness and to lessen swelling.  Do not sit or stand for long periods of time.  Wear support stockings as told by your health care provider. Medicines  Take over-the-counter and prescription medicines only as told by your health care provider.  Your health care provider may prescribe medicine to help your body get rid of excess water (diuretic). General instructions  Pay attention to any changes in your symptoms.  Follow instructions from your health care provider about limiting salt (sodium) in your diet. Sometimes, eating less salt may reduce swelling.  Moisturize skin daily to help prevent skin from cracking and draining.  Keep all follow-up visits as told by your health care provider. This is important. Contact a health care provider if you have:  A fever.  Edema that starts suddenly or is getting worse, especially if you are pregnant or have a medical condition.  Swelling in only one leg.  Increased swelling, redness, or pain in one or both of your legs.  Drainage or sores at the area where you have edema. Get help right away if you:  Develop shortness of breath, especially when you are lying down.  Have pain in your chest or abdomen.  Feel weak.  Feel faint. Summary  Peripheral edema is swelling that is caused by a buildup of fluid. Peripheral edema most often affects the lower legs, ankles, and feet.  Move around often to prevent stiffness and to lessen swelling. Do not sit or stand for long periods of time.  Pay attention to any changes in your symptoms.  Contact a health care provider if you have edema that starts suddenly or is getting worse, especially if you are pregnant or have a medical condition.  Get help  right away if you develop shortness of breath, especially when lying down. This information is not intended to replace advice given to you by your health care  provider. Make sure you discuss any questions you have with your health care provider. Document Revised: 09/04/2018 Document Reviewed: 09/04/2018 Elsevier Patient Education  [employer].    If you have lab work done today you will be contacted with your lab results within the next 2 weeks.  If you have not heard from [place] then please contact us. The fastest way to get your results is to register for My Chart.   IF you received an x-ray today, you will receive an invoice from [facility]. Please contact [facility] at [phone] with questions or concerns regarding your invoice.   IF you received labwork today, you will receive an invoice from [facility]. Please contact [facility] at [phone] with questions or concerns regarding your invoice.   Our billing staff will not be able to assist you with questions regarding bills from these companies.  You will be contacted with the lab results as soon as they are available. The fastest way to get your results is to activate your My Chart account. Instructions are located on the last page of this paperwork. If you have not heard from [place] regarding the results in 2 weeks, please contact this office.         Signed, Merri Ray, MD [facility]

## 2020-07-15 NOTE — Patient Instructions (Addendum)
Changing hydrochlorothiazide to 3 days per week should help the leg swelling. Your recent tests with [name] looked ok. If swelling not better at new dose of medications in next 2 weeks, return for recheck.  Okay to use tramadol if needed for your back pain but make sure to take Colace stool softener every day to help minimize constipation and hard stools.  Make sure to drink plenty of fluids.  Continue Protonix twice per day for heartburn.  See if is below that can worsen heartburn, continue to avoid tomato sauce/tomatoes as those seem to have been an issue for you in the past.  Return to the clinic or go to the nearest emergency room if any of your symptoms worsen or new symptoms occur.  Thank you for coming in today.   Food Choices for Gastroesophageal Reflux Disease, Adult When you have gastroesophageal reflux disease (GERD), the foods you eat and your eating habits are very important. Choosing the right foods can help ease your discomfort. Think about working with a nutrition specialist (dietitian) to help you make good choices. What are tips for following this plan?  Meals  Choose healthy foods that are low in fat, such as fruits, vegetables, whole grains, low-fat dairy products, and lean meat, fish, and poultry.  Eat small meals often instead of 3 large meals a day. Eat your meals slowly, and in a place where you are relaxed. Avoid bending over or lying down until 2-3 hours after eating.  Avoid eating meals 2-3 hours before bed.  Avoid drinking a lot of liquid with meals.  Cook foods using methods other than frying. Bake, grill, or broil food instead.  Avoid or limit: ? Chocolate. ? Peppermint or spearmint. ? Alcohol. ? Pepper. ? Black and decaffeinated coffee. ? Black and decaffeinated tea. ? Bubbly (carbonated) soft drinks. ? Caffeinated energy drinks and soft drinks.  Limit high-fat foods such as: ? Fatty meat or fried foods. ? Whole milk, cream, butter, or ice  cream. ? Nuts and nut butters. ? Pastries, donuts, and sweets made with butter or shortening.  Avoid foods that cause symptoms. These foods may be different for everyone. Common foods that cause symptoms include: ? Tomatoes. ? Oranges, lemons, and limes. ? Peppers. ? Spicy food. ? Onions and garlic. ? Vinegar. Lifestyle  Maintain a healthy weight. Ask your doctor what weight is healthy for you. If you need to lose weight, work with your doctor to do so safely.  Exercise for at least 30 minutes for 5 or more days each week, or as told by your doctor.  Wear loose-fitting clothes.  Do not smoke. If you need help quitting, ask your doctor.  Sleep with the head of your bed higher than your feet. Use a wedge under the mattress or blocks under the bed frame to raise the head of the bed. Summary  When you have gastroesophageal reflux disease (GERD), food and lifestyle choices are very important in easing your symptoms.  Eat small meals often instead of 3 large meals a day. Eat your meals slowly, and in a place where you are relaxed.  Limit high-fat foods such as fatty meat or fried foods.  Avoid bending over or lying down until 2-3 hours after eating.  Avoid peppermint and spearmint, caffeine, alcohol, and chocolate. This information is not intended to replace advice given to you by your health care provider. Make sure you discuss any questions you have with your health care provider. Document Revised: 04/03/2019 Document Reviewed: 01/16/2017  Elsevier Patient Education  [employer].  Peripheral Edema  Peripheral edema is swelling that is caused by a buildup of fluid. Peripheral edema most often affects the lower legs, ankles, and feet. It can also develop in the arms, hands, and face. The area of the body that has peripheral edema will look swollen. It may also feel heavy or warm. Your clothes may start to feel tight. Pressing on the area may make a temporary dent in your skin.  You may not be able to move your swollen arm or leg as much as usual. There are many causes of peripheral edema. It can happen because of a complication of other conditions such as congestive heart failure, kidney disease, or a problem with your blood circulation. It also can be a side effect of certain medicines or because of an infection. It often happens to women during pregnancy. Sometimes, the cause is not known. Follow these instructions at home: Managing pain, stiffness, and swelling   Raise (elevate) your legs while you are sitting or lying down.  Move around often to prevent stiffness and to lessen swelling.  Do not sit or stand for long periods of time.  Wear support stockings as told by your health care provider. Medicines  Take over-the-counter and prescription medicines only as told by your health care provider.  Your health care provider may prescribe medicine to help your body get rid of excess water (diuretic). General instructions  Pay attention to any changes in your symptoms.  Follow instructions from your health care provider about limiting salt (sodium) in your diet. Sometimes, eating less salt may reduce swelling.  Moisturize skin daily to help prevent skin from cracking and draining.  Keep all follow-up visits as told by your health care provider. This is important. Contact a health care provider if you have:  A fever.  Edema that starts suddenly or is getting worse, especially if you are pregnant or have a medical condition.  Swelling in only one leg.  Increased swelling, redness, or pain in one or both of your legs.  Drainage or sores at the area where you have edema. Get help right away if you:  Develop shortness of breath, especially when you are lying down.  Have pain in your chest or abdomen.  Feel weak.  Feel faint. Summary  Peripheral edema is swelling that is caused by a buildup of fluid. Peripheral edema most often affects the lower  legs, ankles, and feet.  Move around often to prevent stiffness and to lessen swelling. Do not sit or stand for long periods of time.  Pay attention to any changes in your symptoms.  Contact a health care provider if you have edema that starts suddenly or is getting worse, especially if you are pregnant or have a medical condition.  Get help right away if you develop shortness of breath, especially when lying down. This information is not intended to replace advice given to you by your health care provider. Make sure you discuss any questions you have with your health care provider. Document Revised: 09/04/2018 Document Reviewed: 09/04/2018 Elsevier Patient Education  [employer].    If you have lab work done today you will be contacted with your lab results within the next 2 weeks.  If you have not heard from [place] then please contact us. The fastest way to get your results is to register for My Chart.   IF you received an x-ray today, you will receive an invoice from  [facility]. Please contact [facility] at [phone] with questions or concerns regarding your invoice.   IF you received labwork today, you will receive an invoice from [facility]. Please contact [facility] at [phone] with questions or concerns regarding your invoice.   Our billing staff will not be able to assist you with questions regarding bills from these companies.  You will be contacted with the lab results as soon as they are available. The fastest way to get your results is to activate your My Chart account. Instructions are located on the last page of this paperwork. If you have not heard from [place] regarding the results in 2 weeks, please contact this office.

## 2020-07-20 ENCOUNTER — Encounter: Payer: Self-pay | Admitting: Family Medicine

## 2020-08-12 ENCOUNTER — Other Ambulatory Visit: Payer: Self-pay

## 2020-08-12 ENCOUNTER — Encounter: Payer: Self-pay | Admitting: Family Medicine

## 2020-08-12 ENCOUNTER — Ambulatory Visit: Payer: Medicare Other | Admitting: Family Medicine

## 2020-08-12 ENCOUNTER — Ambulatory Visit (INDEPENDENT_AMBULATORY_CARE_PROVIDER_SITE_OTHER): Payer: Medicare Other | Admitting: Family Medicine

## 2020-08-12 VITALS — BP 108/58 | HR 86 | Temp 98.0°F | Ht <= 58 in | Wt 111.0 lb

## 2020-08-12 DIAGNOSIS — K219 Gastro-esophageal reflux disease without esophagitis: Secondary | ICD-10-CM

## 2020-08-12 DIAGNOSIS — R609 Edema, unspecified: Secondary | ICD-10-CM | POA: Diagnosis not present

## 2020-08-12 NOTE — Patient Instructions (Addendum)
Continue Protonix twice per day and can call Dr. Havery Moros for follow-up appointment if you continue to have breakthrough heartburn.  Let us know if they need a referral.  Trace swelling in ankles not concerning.  Especially if it improves overnight or when you elevate your legs.  See information below.  Over-the-counter compression stockings, light intensity are options but that is up to you. Return to the clinic or go to the nearest emergency room if any of your symptoms worsen or new symptoms occur.   Edema  Edema is when you have too much fluid in your body or under your skin. Edema may make your legs, feet, and ankles swell up. Swelling is also common in looser tissues, like around your eyes. This is a common condition. It gets more common as you get older. There are many possible causes of edema. Eating too much salt (sodium) and being on your feet or sitting for a long time can cause edema in your legs, feet, and ankles. Hot weather may make edema worse. Edema is usually painless. Your skin may look swollen or shiny. Follow these instructions at home:  Keep the swollen body part raised (elevated) above the level of your heart when you are sitting or lying down.  Do not sit still or stand for a long time.  Do not wear tight clothes. Do not wear garters on your upper legs.  Exercise your legs. This can help the swelling go down.  Wear elastic bandages or support stockings as told by your doctor.  Eat a low-salt (low-sodium) diet to reduce fluid as told by your doctor.  Depending on the cause of your swelling, you may need to limit how much fluid you drink (fluid restriction).  Take over-the-counter and prescription medicines only as told by your doctor. Contact a doctor if:  Treatment is not working.  You have heart, liver, or kidney disease and have symptoms of edema.  You have sudden and unexplained weight gain. Get help right away if:  You have shortness of breath or  chest pain.  You cannot breathe when you lie down.  You have pain, redness, or warmth in the swollen areas.  You have heart, liver, or kidney disease and get edema all of a sudden.  You have a fever and your symptoms get worse all of a sudden. Summary  Edema is when you have too much fluid in your body or under your skin.  Edema may make your legs, feet, and ankles swell up. Swelling is also common in looser tissues, like around your eyes.  Raise (elevate) the swollen body part above the level of your heart when you are sitting or lying down.  Follow your doctor's instructions about diet and how much fluid you can drink (fluid restriction). This information is not intended to replace advice given to you by your health care provider. Make sure you discuss any questions you have with your health care provider. Document Revised: 12/14/2017 Document Reviewed: 12/29/2016 Elsevier Patient Education  [employer].   If you have lab work done today you will be contacted with your lab results within the next 2 weeks.  If you have not heard from [place] then please contact us. The fastest way to get your results is to register for My Chart.   IF you received an x-ray today, you will receive an invoice from [facility]. Please contact [facility] at [phone] with questions or concerns regarding your invoice.   IF you received  labwork today, you will receive an invoice from [employer]. Please contact [facility] at [phone] with questions or concerns regarding your invoice.   Our billing staff will not be able to assist you with questions regarding bills from these companies.  You will be contacted with the lab results as soon as they are available. The fastest way to get your results is to activate your My Chart account. Instructions are located on the last page of this paperwork. If you have not heard from [place] regarding the results in 2 weeks, please contact this office.

## 2020-08-12 NOTE — Progress Notes (Signed)
Subjective:  Patient ID: [name], female    DOB: 02/09/1933  Age: 86 y.o. MRN: [id number]  CC:  Chief Complaint  Patient presents with  . Follow-up    on edema, Degenerative disc disease, lumbar, and acid reflux. PT reports he swelling is still there, but it hasn't gotten any worse. Pt reports she still has the acid reflux at timessince last OV. Pt reports she still has pain, but it's a little better per pt.    HPI [name] presents for   Pedal edema: See evaluation July 22.  Previous normal BNP , normal creatinine, no proteinuria on urinalysis.  CBC reassuring.  Recommended increasing her hydrochlorothiazide to 3 times per week.  Had not yet started 3x/week dosing at last visit.  Still was taking Tuesdays and Saturdays. Minimal dependent edema noted at last visit. Still min swelling, better overnight. Tolerating new regimen, no lightheadedness.   GERD: Continued BID Protonix last visit with trigger avoidance, she is aware of certain foods that has made it worse, specifically tomato. Still having some breakthrough heartburn at times, not daily.   GI Dr. Havery Moros.  Endoscopy in 12/2018: Esophagogastric landmarks identified. - 9 cm hiatal hernia. - LA Grade C reflux esophagitis. - Esophageal mucosal changes classified as Barrett's stage C1-M1 per [place] criteria. Biopsied. - Normal stomach. - Normal duodenal bulb and second portion of the duodenum  History Patient Active Problem List   Diagnosis Date Noted  . Degenerative disc disease, lumbar 01/01/2018  . Hiatal hernia 11/04/2016  . Acute blood loss anemia   . Gastroesophageal reflux disease without esophagitis 07/31/2014  . Gastric ulcer without hemorrhage or perforation 07/22/2013  . History of syphilis 02/27/2012  . Dementia ([place])   . Spondylosis   . Foot deformity   . Migraine   . CAD (coronary artery disease)   . HSV-2 (herpes simplex virus 2) infection   . Abnormal finding on Pap smear, ASCUS   .  Osteoporosis   . CONSTIPATION 12/12/2010  . ANEMIA, IRON DEFICIENCY 06/17/2008  . BARRETTS ESOPHAGUS 06/17/2008  . GLAUCOMA 06/16/2008  . Essential hypertension 06/16/2008  . DIVERTICULOSIS, COLON 06/16/2008  . ARTHRITIS 06/16/2008  . SLEEP APNEA 06/16/2008   Past Medical History:  Diagnosis Date  . Abnormal finding on Pap smear, ASCUS   . Acute renal insufficiency   . Amputation finger    index  . Barrett's esophagus   . CAD (coronary artery disease)   . Dementia ([place])   . Diverticulosis   . Elevated BP   . Foot deformity   . GERD (gastroesophageal reflux disease)   . Glaucoma   . H/O: GI bleed   . HSV-2 (herpes simplex virus 2) infection   . Hyperglycemia   . Hypertension   . Migraine   . Osteoporosis    worsening  . Spondylosis    Past Surgical History:  Procedure Laterality Date  . AMPUTATION FINGER / THUMB     secondary to osteomyelitis  . CATARACT EXTRACTION, BILATERAL Bilateral 12/25/2016  . COLONOSCOPY    . ESOPHAGOGASTRODUODENOSCOPY N/A 10/22/2016   Procedure: ESOPHAGOGASTRODUODENOSCOPY (EGD);  Surgeon: Doran Stabler, MD;  [facility]: [facility] ENDOSCOPY;  Service: Endoscopy;  Laterality: N/A;  . FOOT SURGERY     right  . PARTIAL HYSTERECTOMY    . UPPER GASTROINTESTINAL ENDOSCOPY     No Known Allergies Prior to Admission medications   Medication Sig Start Date End Date Taking? Authorizing Provider  Calcium Carbonate-Vitamin D (CALCIUM-VITAMIN D) 500-200 MG-UNIT tablet Take  3 tablets by mouth every morning.   Yes [provider]  calcium-vitamin D (OSCAL WITH D) 500-200 MG-UNIT TABS tablet calcium carbonate 500 mg (1,250 mg)-vitamin D3 200 unit tablet   3 s by oral route.   Yes [provider]  diclofenac Sodium (VOLTAREN) 1 % GEL Voltaren 1 % topical gel   Yes [provider]  Elastic Bandages & Supports (LUMBAR BACK BRACE/SUPPORT PAD) MISC Dispense one back brace to be worn while doing activities as needed daily 11/03/19  Yes  Stallings, Zoe A, MD  ESTRACE VAGINAL 0.1 MG/GM vaginal cream INSERT 1 APPLICATORFUL VAGINALLY ONCE A WEEK 07/15/19  Yes Stallings, Zoe A, MD  FEROSUL 325 (65 Fe) MG tablet TAKE 1 TABLET(325 MG) BY MOUTH DAILY 01/12/20  Yes Delia Chimes A, MD  FEROSUL 325 (65 Fe) MG tablet TK 1 T PO QD 04/28/20  Yes Stallings, Zoe A, MD  gabapentin (NEURONTIN) 800 MG tablet TAKE 1 TABLET(800 MG) BY MOUTH AT BEDTIME 07/02/20  Yes Wendie Agreste, MD  hydrochlorothiazide (MICROZIDE) 12.5 MG capsule TAKE 1 CAPSULE BY MOUTH EVERY Monday Wednesday AND Friday 07/05/20  Yes Maximiano Coss, NP  hydrocortisone 2.5 % ointment APPLY EXTERNALLY TO THE AFFECTED AREA TWICE DAILY 05/31/18  Yes Wardell Honour, MD  hydroxypropyl methylcellulose (ISOPTO TEARS) 2.5 % ophthalmic solution Place 1 drop into both eyes 2 (two) times daily as needed for dry eyes.    Yes [provider]  lactose free nutrition (BOOST) LIQD Take 237 mLs by mouth 2 (two) times daily between meals. Patient taking differently: Take 1 Container by mouth 2 (two) times daily as needed (nutritional).  03/22/16  Yes [name], MD  latanoprost (XALATAN) 0.005 % ophthalmic solution INT 1 GTT IN OD QD 08/26/19  Yes [provider]  lidocaine (LIDODERM) 5 % Place 1 patch onto the skin daily. Remove & Discard patch within 12 hours or as directed by MD.  Apply to the right side of your neck where it hurts 05/25/18  Yes Blanchie Dessert, MD  methocarbamol (ROBAXIN) 500 MG tablet Take 1 tablet (500 mg total) by mouth daily as needed for muscle spasms. After doing yard work. 05/17/20  Yes Stallings, Zoe A, MD  metoprolol succinate (TOPROL-XL) 50 MG 24 hr tablet TAKE 1 TABLET BY MOUTH EVERY DAY WITH OR IMMEDIATELY FOLLOWING A MEAL 03/03/20  Yes Stallings, Zoe A, MD  nystatin cream (MYCOSTATIN) Apply to external affected area twice a day for 10 days as needed. 11/08/19  Yes Stallings, Zoe A, MD  ofloxacin (OCUFLOX) 0.3 % ophthalmic solution 10 drops in right ear  once daily for 5 days. 01/29/19  Yes Yu, Amy V, PA-C  pantoprazole (PROTONIX) 40 MG tablet Take 1 tablet (40 mg total) by mouth 2 (two) times daily. 11/03/19  Yes Forrest Moron, MD  PREMARIN vaginal cream  10/10/19  Yes [provider]  promethazine (PHENERGAN) 12.5 MG tablet take 1 tablet by mouth every 6 hours if needed for nausea and vomiting 11/03/19  Yes Stallings, Zoe A, MD  sucralfate (CARAFATE) 1 g tablet Take 1 tablet (1 g total) by mouth every 6 (six) hours as needed. Slowly dissolve tablet in 1 Tablespoon of distilled water for 15 minutes before taking (do not crush) 07/15/19  Yes Armbruster, Carlota Raspberry, MD  traMADol (ULTRAM) 50 MG tablet tramadol 50 mg tablet  Take 1 tablet every 6 hours by oral route.   Yes [provider]  traMADol-acetaminophen (ULTRACET) 37.5-325 MG tablet Take 1  tablet by mouth every 6 (six) hours. 05/17/20  Yes Stallings, Zoe A, MD  triamcinolone cream (KENALOG) 0.1 % Apply topically 2 (two) times daily. 09/04/18  Yes Stallings, Zoe A, MD  verapamil (CALAN-SR) 240 MG CR tablet Take 1 tablet (240 mg total) by mouth daily. 11/03/19  Yes Forrest Moron, MD  vitamin C (ASCORBIC ACID) 500 MG tablet Take 500 mg by mouth daily.   Yes [provider]  ZINC OXIDE, TOPICAL, 10 % CREA 1 application to affected area to prevent moisture and further irritation of skin. 01/29/19  Yes Ok Edwards, PA-C   Social History   Socioeconomic History  . Marital status: Single    Spouse name: Not on file  . Number of children: 1  . Years of education: Not on file  . Highest education level: 12th grade  Occupational History    Employer: RETIRED  Tobacco Use  . Smoking status: Never Smoker  . Smokeless tobacco: Never Used  Vaping Use  . Vaping Use: Never used  Substance and Sexual Activity  . Alcohol use: No    Alcohol/week: 0.0 standard drinks  . Drug use: No  . Sexual activity: Never    Birth control/protection: Post-menopausal  Other Topics Concern  .  Not on file  Social History Narrative   Marital status: divorced.      Children: 1 son; no grandchildren      Lives: with son      Employment: retired      Tobacco: none      Alcohol: none      Drugs: none       Exercise: none       ADLs:  Independent with ADLs.  Quit driving age 29.  Medical CNA cleans house; makes breakfast; patient pays bills.      Advanced Directives:   YES.  HCPOA:  Son/Isaac Bowman.  FULL CODE; no prolonged measures.   Social Determinants of Health   Financial Resource Strain:   . Difficulty of Paying Living Expenses: Not on file  Food Insecurity:   . Worried About Charity fundraiser in the Last Year: Not on file  . Ran Out of Food in the Last Year: Not on file  Transportation Needs:   . Lack of Transportation (Medical): Not on file  . Lack of Transportation (Non-Medical): Not on file  Physical Activity:   . Days of Exercise per Week: Not on file  . Minutes of Exercise per Session: Not on file  Stress:   . Feeling of Stress : Not on file  Social Connections:   . Frequency of Communication with Friends and Family: Not on file  . Frequency of Social Gatherings with Friends and Family: Not on file  . Attends Religious Services: Not on file  . Active Member of Clubs or Organizations: Not on file  . Attends Archivist Meetings: Not on file  . Marital Status: Not on file  Intimate Partner Violence:   . Fear of Current or Ex-Partner: Not on file  . Emotionally Abused: Not on file  . Physically Abused: Not on file  . Sexually Abused: Not on file    Review of Systems Denies chest pain, dyspnea, lightheadedness, dizziness, other per HPI.  Objective:   Vitals:   08/12/20 1540  BP: (!) 108/58  Pulse: 86  Temp: 98 F (36.7 C)  TempSrc: Temporal  SpO2: 98%  Weight: 111 lb (50.3 kg)  Height: 4\' 10"  (1.473 m)  Physical Exam Vitals reviewed.  Constitutional:      Appearance: She is well-developed.  HENT:     Head: Normocephalic and  atraumatic.  Eyes:     Conjunctiva/sclera: Conjunctivae normal.     Pupils: Pupils are equal, round, and reactive to light.  Neck:     Vascular: No carotid bruit.  Cardiovascular:     Rate and Rhythm: Normal rate and regular rhythm.     Heart sounds: Normal heart sounds.  Pulmonary:     Effort: Pulmonary effort is normal.     Breath sounds: Normal breath sounds.  Abdominal:     Palpations: Abdomen is soft. There is no pulsatile mass.     Tenderness: There is no abdominal tenderness.  Musculoskeletal:     Right lower leg: Edema (Trace to 1+ lower third.  No skin lesion) present.     Left lower leg: Edema present.  Skin:    General: Skin is warm and dry.  Neurological:     Mental Status: She is alert and oriented to person, place, and time.  Psychiatric:        Mood and Affect: Mood normal.        Behavior: Behavior normal.    Depression screen [facility] 2/9 08/12/2020 07/15/2020 07/15/2020 06/16/2020 04/16/2020  Decreased Interest 0 0 0 0 0  Down, Depressed, Hopeless 0 1 0 0 0  PHQ - 2 Score 0 1 0 0 0  Altered sleeping - - - - -  Tired, decreased energy - - - - -  Change in appetite - - - - -  Feeling bad or failure about yourself  - - - - -  Trouble concentrating - - - - -  Moving slowly or fidgety/restless - - - - -  Suicidal thoughts - - - - -  PHQ-9 Score - - - - -  Difficult doing work/chores - - - - -  Some recent data might be hidden       Assessment & Plan:  [name] is a 86 y.o. female . Dependent edema  -Minimal dependent edema that improves with supine/overnight.  Previous BNP, exam reassuring.  Lungs clear.  No sign of failure.  Symptomatic care discussed.  Light compression stockings as option.  Watch for orthostatic symptoms with more frequent dosing of HCTZ, asymptomatic at present.  Gastroesophageal reflux disease without esophagitis  -Some intermittent breakthrough symptoms.  Plans to call gastroenterology for follow-up.  Continue twice daily dosing of PPI  and trigger food avoidance.  6-week follow-up for repeat labs, recheck edema  No orders of the defined types were placed in this encounter.  Patient Instructions    Continue Protonix twice per day and can call Dr. Havery Moros for follow-up appointment if you continue to have breakthrough heartburn.  Let us know if they need a referral.  Trace swelling in ankles not concerning.  Especially if it improves overnight or when you elevate your legs.  See information below.  Over-the-counter compression stockings, light intensity are options but that is up to you. Return to the clinic or go to the nearest emergency room if any of your symptoms worsen or new symptoms occur.   Edema  Edema is when you have too much fluid in your body or under your skin. Edema may make your legs, feet, and ankles swell up. Swelling is also common in looser tissues, like around your eyes. This is a common condition. It gets more common as you get older. There are many  possible causes of edema. Eating too much salt (sodium) and being on your feet or sitting for a long time can cause edema in your legs, feet, and ankles. Hot weather may make edema worse. Edema is usually painless. Your skin may look swollen or shiny. Follow these instructions at home:  Keep the swollen body part raised (elevated) above the level of your heart when you are sitting or lying down.  Do not sit still or stand for a long time.  Do not wear tight clothes. Do not wear garters on your upper legs.  Exercise your legs. This can help the swelling go down.  Wear elastic bandages or support stockings as told by your doctor.  Eat a low-salt (low-sodium) diet to reduce fluid as told by your doctor.  Depending on the cause of your swelling, you may need to limit how much fluid you drink (fluid restriction).  Take over-the-counter and prescription medicines only as told by your doctor. Contact a doctor if:  Treatment is not working.  You  have heart, liver, or kidney disease and have symptoms of edema.  You have sudden and unexplained weight gain. Get help right away if:  You have shortness of breath or chest pain.  You cannot breathe when you lie down.  You have pain, redness, or warmth in the swollen areas.  You have heart, liver, or kidney disease and get edema all of a sudden.  You have a fever and your symptoms get worse all of a sudden. Summary  Edema is when you have too much fluid in your body or under your skin.  Edema may make your legs, feet, and ankles swell up. Swelling is also common in looser tissues, like around your eyes.  Raise (elevate) the swollen body part above the level of your heart when you are sitting or lying down.  Follow your doctor's instructions about diet and how much fluid you can drink (fluid restriction). This information is not intended to replace advice given to you by your health care provider. Make sure you discuss any questions you have with your health care provider. Document Revised: 12/14/2017 Document Reviewed: 12/29/2016 Elsevier Patient Education  [employer].   If you have lab work done today you will be contacted with your lab results within the next 2 weeks.  If you have not heard from [place] then please contact us. The fastest way to get your results is to register for My Chart.   IF you received an x-ray today, you will receive an invoice from [facility]. Please contact [facility] at [phone] with questions or concerns regarding your invoice.   IF you received labwork today, you will receive an invoice from [facility]. Please contact [facility] at [phone] with questions or concerns regarding your invoice.   Our billing staff will not be able to assist you with questions regarding bills from these companies.  You will be contacted with the lab results as soon as they are available. The fastest way to get your results is to activate your  My Chart account. Instructions are located on the last page of this paperwork. If you have not heard from [place] regarding the results in 2 weeks, please contact this office.         Signed, Merri Ray, MD [facility]

## 2020-08-13 ENCOUNTER — Ambulatory Visit: Payer: Medicare Other | Admitting: Family Medicine

## 2020-08-16 ENCOUNTER — Other Ambulatory Visit: Payer: Self-pay | Admitting: Family Medicine

## 2020-08-16 DIAGNOSIS — M5136 Other intervertebral disc degeneration, lumbar region: Secondary | ICD-10-CM

## 2020-08-18 ENCOUNTER — Telehealth: Payer: Self-pay

## 2020-08-18 DIAGNOSIS — M5136 Other intervertebral disc degeneration, lumbar region: Secondary | ICD-10-CM

## 2020-08-18 NOTE — Telephone Encounter (Signed)
I have routed the message to Dr. Carlota Raspberry to please advise.

## 2020-08-19 MED ORDER — TRAMADOL-ACETAMINOPHEN 37.5-325 MG PO TABS: 1.0000 | ORAL_TABLET | Freq: Four times a day (QID) | ORAL | 0 refills | Status: DC

## 2020-08-19 NOTE — Addendum Note (Signed)
Addended by: Merri Ray R on: 08/19/2020 07:19 PM   Modules accepted: Orders

## 2020-08-19 NOTE — Telephone Encounter (Addendum)
ultracet for back pain discussed at 7/22 visit.  Controlled substance database (PDMP) reviewed. No concerns appreciated. Last filled #60 on 05/18/20. Refill ordered.

## 2020-08-27 DIAGNOSIS — M791 Myalgia, unspecified site: Secondary | ICD-10-CM | POA: Diagnosis not present

## 2020-08-29 ENCOUNTER — Encounter (HOSPITAL_COMMUNITY): Payer: Self-pay

## 2020-08-29 ENCOUNTER — Other Ambulatory Visit: Payer: Self-pay

## 2020-08-29 ENCOUNTER — Ambulatory Visit (HOSPITAL_COMMUNITY)
Admission: EM | Admit: 2020-08-29 | Discharge: 2020-08-29 | Disposition: A | Discharge status: Home / Self Care | Payer: Medicare Other | Attending: Family Medicine | Admitting: Family Medicine

## 2020-08-29 DIAGNOSIS — M62830 Muscle spasm of back: Secondary | ICD-10-CM | POA: Diagnosis not present

## 2020-08-29 DIAGNOSIS — M5136 Other intervertebral disc degeneration, lumbar region: Secondary | ICD-10-CM

## 2020-08-29 MED ORDER — TIZANIDINE HCL 4 MG PO TABS
4.0000 mg | ORAL_TABLET | Freq: Four times a day (QID) | ORAL | 0 refills | Status: DC | PRN
Start: 2020-08-29 — End: 2021-05-06

## 2020-08-29 NOTE — Discharge Instructions (Addendum)
Use the muscle relaxant as needed Heat to the back Tramadol as needed

## 2020-08-29 NOTE — ED Triage Notes (Signed)
Pt presents with chronic back pain that has increased with getting her cortisone injection on Friday.

## 2020-08-30 NOTE — ED Provider Notes (Signed)
[facility]    CSN: [id number] Arrival date & time: 08/29/20  1533      History   Chief Complaint Chief Complaint  Patient presents with  . Back Pain    HPI [name] is a 86 y.o. female.   Patient is a 14-year-old female with past medical history of lumbar degenerative disc disease.  She presents today with generalized back pain more in the musculature area.  She has chronic pain and takes tramadol for this.  Reports receiving multiple steroid injections this past Friday.  Describes the pain is generalized throughout her whole entire back.  Describes as spasming.  Has been taking her tramadol without much relief.  Denies any urinary symptoms, fever.  No chest pain or shortness of breath.     Past Medical History:  Diagnosis Date  . Abnormal finding on Pap smear, ASCUS   . Acute renal insufficiency   . Amputation finger    index  . Barrett's esophagus   . CAD (coronary artery disease)   . Dementia ([place])   . Diverticulosis   . Elevated BP   . Foot deformity   . GERD (gastroesophageal reflux disease)   . Glaucoma   . H/O: GI bleed   . HSV-2 (herpes simplex virus 2) infection   . Hyperglycemia   . Hypertension   . Migraine   . Osteoporosis    worsening  . Spondylosis     Patient Active Problem List   Diagnosis Date Noted  . Degenerative disc disease, lumbar 01/01/2018  . Hiatal hernia 11/04/2016  . Acute blood loss anemia   . Gastroesophageal reflux disease without esophagitis 07/31/2014  . Gastric ulcer without hemorrhage or perforation 07/22/2013  . History of syphilis 02/27/2012  . Dementia ([place])   . Spondylosis   . Foot deformity   . Migraine   . CAD (coronary artery disease)   . HSV-2 (herpes simplex virus 2) infection   . Abnormal finding on Pap smear, ASCUS   . Osteoporosis   . CONSTIPATION 12/12/2010  . ANEMIA, IRON DEFICIENCY 06/17/2008  . BARRETTS ESOPHAGUS 06/17/2008  . GLAUCOMA 06/16/2008  . Essential hypertension 06/16/2008  .  DIVERTICULOSIS, COLON 06/16/2008  . ARTHRITIS 06/16/2008  . SLEEP APNEA 06/16/2008    Past Surgical History:  Procedure Laterality Date  . AMPUTATION FINGER / THUMB     secondary to osteomyelitis  . CATARACT EXTRACTION, BILATERAL Bilateral 12/25/2016  . COLONOSCOPY    . ESOPHAGOGASTRODUODENOSCOPY N/A 10/22/2016   Procedure: ESOPHAGOGASTRODUODENOSCOPY (EGD);  Surgeon: Doran Stabler, MD;  Location: [facility] ENDOSCOPY;  Service: Endoscopy;  Laterality: N/A;  . FOOT SURGERY     right  . PARTIAL HYSTERECTOMY    . UPPER GASTROINTESTINAL ENDOSCOPY      OB History   No obstetric history on file.      Home Medications    Prior to Admission medications   Medication Sig Start Date End Date Taking? Authorizing Provider  Calcium Carbonate-Vitamin D (CALCIUM-VITAMIN D) 500-200 MG-UNIT tablet Take 3 tablets by mouth every morning.    [provider]  calcium-vitamin D (OSCAL WITH D) 500-200 MG-UNIT TABS tablet calcium carbonate 500 mg (1,250 mg)-vitamin D3 200 unit tablet   3 tbls by oral route.    [provider]  diclofenac Sodium (VOLTAREN) 1 % GEL Voltaren 1 % topical gel    [provider]  Elastic Bandages & Supports (LUMBAR BACK BRACE/SUPPORT PAD) MISC Dispense one back brace to be worn while doing activities as  needed daily 11/03/19   Forrest Moron, MD  ESTRACE VAGINAL 0.1 MG/GM vaginal cream INSERT 1 APPLICATORFUL VAGINALLY ONCE A WEEK 07/15/19   Forrest Moron, MD  FEROSUL 325 (65 Fe) MG tablet TAKE 1 TABLET(325 MG) BY MOUTH DAILY 01/12/20   Forrest Moron, MD  FEROSUL 325 (65 Fe) MG tablet TK 1 T PO QD 04/28/20   Forrest Moron, MD  gabapentin (NEURONTIN) 800 MG tablet TAKE 1 TABLET(800 MG) BY MOUTH AT BEDTIME 07/02/20   Wendie Agreste, MD  hydrochlorothiazide (MICROZIDE) 12.5 MG capsule TAKE 1 CAPSULE BY MOUTH EVERY Monday Wednesday AND Friday 07/05/20   Maximiano Coss, NP  hydrocortisone 2.5 % ointment APPLY EXTERNALLY TO THE AFFECTED AREA TWICE  DAILY 05/31/18   Wardell Honour, MD  hydroxypropyl methylcellulose (ISOPTO TEARS) 2.5 % ophthalmic solution Place 1 drop into both eyes 2 (two) times daily as needed for dry eyes.     [provider]  lactose free nutrition (BOOST) LIQD Take 237 mLs by mouth 2 (two) times daily between meals. Patient taking differently: Take 1 Container by mouth 2 (two) times daily as needed (nutritional).  03/22/16   [name], MD  latanoprost (XALATAN) 0.005 % ophthalmic solution INT 1 GTT IN OD QD 08/26/19   [provider]  lidocaine (LIDODERM) 5 % Place 1 patch onto the skin daily. Remove & Discard patch within 12 hours or as directed by MD.  Apply to the right side of your neck where it hurts 05/25/18   Blanchie Dessert, MD  metoprolol succinate (TOPROL-XL) 50 MG 24 hr tablet TAKE 1 TABLET BY MOUTH EVERY DAY WITH OR IMMEDIATELY FOLLOWING A MEAL 03/03/20   Delia Chimes A, MD  nystatin cream (MYCOSTATIN) Apply to external affected area twice a day for 10 days as needed. 11/08/19   Forrest Moron, MD  ofloxacin (OCUFLOX) 0.3 % ophthalmic solution 10 drops in right ear once daily for 5 days. 01/29/19   Tasia Catchings, Amy V, PA-C  pantoprazole (PROTONIX) 40 MG tablet Take 1 tablet (40 mg total) by mouth 2 (two) times daily. 11/03/19   Forrest Moron, MD  PREMARIN vaginal cream  10/10/19   [provider]  promethazine (PHENERGAN) 12.5 MG tablet take 1 tablet by mouth every 6 hours if needed for nausea and vomiting 11/03/19   Delia Chimes A, MD  sucralfate (CARAFATE) 1 g tablet Take 1 tablet (1 g total) by mouth every 6 (six) hours as needed. Slowly dissolve tablet in 1 Tablespoon of distilled water for 15 minutes before taking (do not crush) 07/15/19   Armbruster, Carlota Raspberry, MD  tiZANidine (ZANAFLEX) 4 MG tablet Take 1 tablet (4 mg total) by mouth every 6 (six) hours as needed for muscle spasms. 08/29/20   Loura Halt A, NP  traMADol (ULTRAM) 50 MG tablet tramadol 50 mg tablet  Take 1 tablet every 6  hours by oral route.    [provider]  traMADol-acetaminophen (ULTRACET) 37.5-325 MG tablet Take 1 tablet by mouth every 6 (six) hours. 08/19/20   Wendie Agreste, MD  triamcinolone cream (KENALOG) 0.1 % Apply topically 2 (two) times daily. 09/04/18   Forrest Moron, MD  verapamil (CALAN-SR) 240 MG CR tablet Take 1 tablet (240 mg total) by mouth daily. 11/03/19   Forrest Moron, MD  vitamin C (ASCORBIC ACID) 500 MG tablet Take 500 mg by mouth daily.    [provider]  ZINC OXIDE, TOPICAL, 10 % CREA 1 application  to affected area to prevent moisture and further irritation of skin. 01/29/19   Ok Edwards, PA-C    Family History Family History  Problem Relation Age of Onset  . Breast cancer Mother   . Heart attack Mother   . Heart disease Father   . Alcohol abuse Son   . Colon cancer Neg Hx   . Stomach cancer Neg Hx   . Rectal cancer Neg Hx     Social History Social History   Tobacco Use  . Smoking status: Never Smoker  . Smokeless tobacco: Never Used  Vaping Use  . Vaping Use: Never used  Substance Use Topics  . Alcohol use: No    Alcohol/week: 0.0 standard drinks  . Drug use: No     Allergies   Patient has no known allergies.   Review of Systems Review of Systems   Physical Exam Triage Vital Signs ED Triage Vitals [08/29/20 1706]  Enc Vitals Group     BP (!) 164/74     Pulse Rate 86     Resp 17     Temp 98.8 F (37.1 C)     Temp Source Oral     SpO2 97 %     Weight      Height      Head Circumference      Peak Flow      Pain Score 9     Pain Loc      Pain Edu?      Excl. in [place]?    No data found.  Updated Vital Signs BP (!) 164/74 (BP Location: Left Arm)   Pulse 86   Temp 98.8 F (37.1 C) (Oral)   Resp 17   SpO2 97%   Visual Acuity Right Eye Distance:   Left Eye Distance:   Bilateral Distance:    Right Eye Near:   Left Eye Near:    Bilateral Near:     Physical Exam Vitals and nursing note reviewed.  Constitutional:       General: She is not in acute distress.    Appearance: Normal appearance. She is not ill-appearing, toxic-appearing or diaphoretic.  HENT:     Head: Normocephalic.     Nose: Nose normal.  Eyes:     Conjunctiva/sclera: Conjunctivae normal.  Cardiovascular:     Rate and Rhythm: Normal rate and regular rhythm.  Pulmonary:     Effort: Pulmonary effort is normal.     Breath sounds: Normal breath sounds.  Musculoskeletal:        General: Normal range of motion.     Cervical back: Normal range of motion.     Comments: Tender to entire musculature of back.  Skin:    General: Skin is warm and dry.     Findings: No rash.  Neurological:     Mental Status: She is alert.  Psychiatric:        Mood and Affect: Mood normal.      UC Treatments / Results  Labs (all labs ordered are listed, but only abnormal results are displayed) Labs Reviewed - No data to display  EKG   Radiology No results found.  Procedures Procedures (including critical care time)  Medications Ordered in UC Medications - No data to display  Initial Impression / Assessment and Plan / UC Course  I have reviewed the triage vital signs and the nursing notes.  Pertinent labs & imaging results that were available during my care of the patient were reviewed  by me and considered in my medical decision making (see chart for details).     Back spasms Most likely the cause of her symptoms.  She has chronic back pain. No other concerning signs or symptoms.  No concerns on exam. Recommended muscle accident and heat to the back  Tramadol as needed Final Clinical Impressions(s) / UC Diagnoses   Final diagnoses:  Muscle spasm of back     Discharge Instructions     Use the muscle relaxant as needed Heat to the back Tramadol as needed    ED Prescriptions    Medication Sig Dispense Auth. Provider   tiZANidine (ZANAFLEX) 4 MG tablet Take 1 tablet (4 mg total) by mouth every 6 (six) hours as needed for  muscle spasms. 30 tablet [place] Mcphee A, NP     I have reviewed the PDMP during this encounter.   Loura Halt A, NP 08/30/20 1413

## 2020-09-07 ENCOUNTER — Telehealth: Payer: Self-pay | Admitting: Family Medicine

## 2020-09-07 NOTE — Telephone Encounter (Signed)
Received a fax after hours that pt states, " she has medication for her muscle spsams and she has gabapentin as well and would like to know which medication she should take." Please advise.

## 2020-09-07 NOTE — Telephone Encounter (Signed)
Spoke with [name] about her medications and informed her she is allowed both but to take them separately if they are making her sleepy pt is concerned the muscle spasms will never go away I told her it is about finding treatment that works for her.

## 2020-09-08 ENCOUNTER — Other Ambulatory Visit: Payer: Self-pay

## 2020-09-08 ENCOUNTER — Ambulatory Visit (INDEPENDENT_AMBULATORY_CARE_PROVIDER_SITE_OTHER): Payer: Medicare Other | Admitting: Podiatry

## 2020-09-08 ENCOUNTER — Telehealth: Payer: Self-pay | Admitting: Family Medicine

## 2020-09-08 DIAGNOSIS — I739 Peripheral vascular disease, unspecified: Secondary | ICD-10-CM

## 2020-09-08 DIAGNOSIS — M79676 Pain in unspecified toe(s): Secondary | ICD-10-CM | POA: Diagnosis not present

## 2020-09-08 DIAGNOSIS — B351 Tinea unguium: Secondary | ICD-10-CM | POA: Diagnosis not present

## 2020-09-08 DIAGNOSIS — L84 Corns and callosities: Secondary | ICD-10-CM | POA: Diagnosis not present

## 2020-09-08 NOTE — Telephone Encounter (Signed)
Tried calling pt to sch a virtual or office visit if pt has a negative covid test. Pt vm is not set up so I could not leave a message. Will try again latter.  Pt called after hours stating she threw up and it was black. Also stated she is urinating frequently and ankles are swollen. Please advise.

## 2020-09-09 NOTE — Telephone Encounter (Signed)
LM letting pt know if she is vomiting and it is black she needs to be seen in ED immediatly and no wait for an appointment with Dr Carlota Raspberry if she is feeling worse should call an ambulance.

## 2020-09-10 ENCOUNTER — Other Ambulatory Visit: Payer: Self-pay

## 2020-09-10 ENCOUNTER — Encounter: Payer: Self-pay | Admitting: Registered Nurse

## 2020-09-10 ENCOUNTER — Ambulatory Visit (INDEPENDENT_AMBULATORY_CARE_PROVIDER_SITE_OTHER): Payer: Medicare Other | Admitting: Registered Nurse

## 2020-09-10 VITALS — BP 116/61 | HR 84 | Temp 98.1°F | Resp 18 | Ht <= 58 in

## 2020-09-10 DIAGNOSIS — R609 Edema, unspecified: Secondary | ICD-10-CM

## 2020-09-10 DIAGNOSIS — R112 Nausea with vomiting, unspecified: Secondary | ICD-10-CM

## 2020-09-10 DIAGNOSIS — R195 Other fecal abnormalities: Secondary | ICD-10-CM

## 2020-09-10 NOTE — Progress Notes (Signed)
Established Patient Office Visit  Subjective:  Patient ID: [name], female    DOB: [birth year]  Age: 86 y.o. MRN: [id number]  CC:  Chief Complaint  Patient presents with  . Anorexia    Patient states she has had an loss of Appetite wants to know if its the boost drink making her sick. Per patient she has has threw up something black three days ago. ankles are still swollen.    HPI [name] presents for nausea   3-4 days of loss of appetite. Consistent nausea. One episode of vomiting black liquid - firmly denies coffee ground appearance of emesis  Unfortunately patient is a poor historian and very tangential.  Not feeling lightheaded or dizzy Does reports bristol type 1 stool - not her normal. Denies diet change or activity change beyond spending a few days off her feet following a mild ankle injury.  Reports that she is feeling much improved at this time beyond a loss of appetite- this is concerning to her because she likes to eat. Still eating 3 meals each day, 1-2 boost shakes as well.   No other concerns at this time.   Past Medical History:  Diagnosis Date  . Abnormal finding on Pap smear, ASCUS   . Acute renal insufficiency   . Amputation finger    index  . Barrett's esophagus   . CAD (coronary artery disease)   . Dementia ([place])   . Diverticulosis   . Elevated BP   . Foot deformity   . GERD (gastroesophageal reflux disease)   . Glaucoma   . H/O: GI bleed   . HSV-2 (herpes simplex virus 2) infection   . Hyperglycemia   . Hypertension   . Migraine   . Osteoporosis    worsening  . Spondylosis     Past Surgical History:  Procedure Laterality Date  . AMPUTATION FINGER / THUMB     secondary to osteomyelitis  . CATARACT EXTRACTION, BILATERAL Bilateral 12/25/2016  . COLONOSCOPY    . ESOPHAGOGASTRODUODENOSCOPY N/A 10/22/2016   Procedure: ESOPHAGOGASTRODUODENOSCOPY (EGD);  Surgeon: Doran Stabler, MD;  Location: [facility] ENDOSCOPY;  Service: Endoscopy;   Laterality: N/A;  . FOOT SURGERY     right  . PARTIAL HYSTERECTOMY    . UPPER GASTROINTESTINAL ENDOSCOPY      Family History  Problem Relation Age of Onset  . Breast cancer Mother   . Heart attack Mother   . Heart disease Father   . Alcohol abuse Son   . Colon cancer Neg Hx   . Stomach cancer Neg Hx   . Rectal cancer Neg Hx     Social History   Socioeconomic History  . Marital status: Single    Spouse name: Not on file  . Number of children: 1  . Years of education: Not on file  . Highest education level: 12th grade  Occupational History    Employer: RETIRED  Tobacco Use  . Smoking status: Never Smoker  . Smokeless tobacco: Never Used  Vaping Use  . Vaping Use: Never used  Substance and Sexual Activity  . Alcohol use: No    Alcohol/week: 0.0 standard drinks  . Drug use: No  . Sexual activity: Never    Birth control/protection: Post-menopausal  Other Topics Concern  . Not on file  Social History Narrative   Marital status: divorced.      Children: 1 son; no grandchildren      Lives: with son      Employment:  retired      Tobacco: none      Alcohol: none      Drugs: none       Exercise: none       ADLs:  Independent with ADLs.  Quit driving age 23.  Medical CNA cleans house; makes breakfast; patient pays bills.      Advanced Directives:   YES.  HCPOA:  Son/Isaac Bowman.  FULL CODE; no prolonged measures.   Social Determinants of Health   Financial Resource Strain:   . Difficulty of Paying Living Expenses: Not on file  Food Insecurity:   . Worried About Charity fundraiser in the Last Year: Not on file  . Ran Out of Food in the Last Year: Not on file  Transportation Needs:   . Lack of Transportation (Medical): Not on file  . Lack of Transportation (Non-Medical): Not on file  Physical Activity:   . Days of Exercise per Week: Not on file  . Minutes of Exercise per Session: Not on file  Stress:   . Feeling of Stress : Not on file  Social Connections:   .  Frequency of Communication with Friends and Family: Not on file  . Frequency of Social Gatherings with Friends and Family: Not on file  . Attends Religious Services: Not on file  . Active Member of Clubs or Organizations: Not on file  . Attends Archivist Meetings: Not on file  . Marital Status: Not on file  Intimate Partner Violence:   . Fear of Current or Ex-Partner: Not on file  . Emotionally Abused: Not on file  . Physically Abused: Not on file  . Sexually Abused: Not on file     No Known Allergies  ROS Review of Systems  Constitutional: Negative.   HENT: Negative.   Eyes: Negative.   Respiratory: Negative.   Cardiovascular: Negative.   Gastrointestinal: Positive for constipation, nausea and vomiting. Negative for abdominal distention, abdominal pain, anal bleeding, blood in stool, diarrhea and rectal pain.  Genitourinary: Negative.   Musculoskeletal: Negative.   Skin: Negative.   Neurological: Negative.   Psychiatric/Behavioral: Negative.       Objective:    Physical Exam Vitals and nursing note reviewed.  Constitutional:      General: She is not in acute distress.    Appearance: Normal appearance. She is normal weight. She is not ill-appearing, toxic-appearing or diaphoretic.  Cardiovascular:     Rate and Rhythm: Normal rate and regular rhythm.     Heart sounds: Normal heart sounds. No murmur heard.  No friction rub. No gallop.   Pulmonary:     Effort: Pulmonary effort is normal. No respiratory distress.     Breath sounds: Normal breath sounds. No stridor. No wheezing, rhonchi or rales.  Chest:     Chest wall: No tenderness.  Abdominal:     General: Abdomen is flat. Bowel sounds are normal.     Tenderness: There is no abdominal tenderness.  Skin:    General: Skin is warm and dry.  Neurological:     General: No focal deficit present.     Mental Status: She is alert and oriented to person, place, and time. Mental status is at baseline.   Psychiatric:        Mood and Affect: Mood normal.        Behavior: Behavior normal.        Thought Content: Thought content normal.        Judgment: Judgment normal.  BP 116/61   Pulse 84   Temp 98.1 F (36.7 C) (Temporal)   Resp 18   Ht 4\' 10"  (1.473 m)   SpO2 99%   BMI 23.20 kg/m  Wt Readings from Last 3 Encounters:  08/12/20 111 lb (50.3 kg)  07/15/20 111 lb (50.3 kg)  06/16/20 111 lb 12.8 oz (50.7 kg)     Health Maintenance Due  Topic Date Due  . COVID-19 Vaccine (2 - Moderna 2-dose series) 03/05/2020    There are no preventive care reminders to display for this patient.  Lab Results  Component Value Date   TSH 1.060 01/01/2019   Lab Results  Component Value Date   WBC 6.0 06/16/2020   HGB 11.8 06/16/2020   HCT 35.8 06/16/2020   MCV 88 06/16/2020   PLT 328 06/16/2020   Lab Results  Component Value Date   NA 145 (H) 06/16/2020   K 4.2 06/16/2020   CO2 26 06/16/2020   GLUCOSE 118 (H) 06/16/2020   BUN 21 06/16/2020   CREATININE 0.91 06/16/2020   BILITOT 0.2 06/16/2020   ALKPHOS 62 06/16/2020   AST 12 06/16/2020   ALT 10 06/16/2020   PROT 6.4 06/16/2020   ALBUMIN 3.7 06/16/2020   CALCIUM 9.3 06/16/2020   ANIONGAP 7 10/22/2016   Lab Results  Component Value Date   CHOL 175 11/03/2019   Lab Results  Component Value Date   HDL 82 11/03/2019   Lab Results  Component Value Date   LDLCALC 78 11/03/2019   Lab Results  Component Value Date   TRIG 79 11/03/2019   Lab Results  Component Value Date   CHOLHDL 2.1 11/03/2019   Lab Results  Component Value Date   HGBA1C 5.4 05/30/2017      Assessment & Plan:   Problem List Items Addressed This Visit    None    Visit Diagnoses    Hard stool    -  Primary   Relevant Orders   Comprehensive metabolic panel   Amylase   Lipase   TSH   CBC   Nausea and vomiting, intractability of vomiting not specified, unspecified vomiting type       Relevant Orders   Comprehensive metabolic panel    Amylase   Lipase   TSH   CBC   Dependent edema       Relevant Orders   TSH   CBC      No orders of the defined types were placed in this encounter.   Follow-up: No follow-ups on file.   PLAN  Unfortunately as a poor historian, unclear on the circumstances surrounding the black emesis.  With improvement, will encourage patient to push fluids and use OTC miralax to loosen stool  Return precautions given, ER precautions given - unfortunately unclear to the extent that the patient understood these.  Labs collected, will follow up as warranted  Unfortunately patient left before completion of visit - hoping to undertake a more thorough exam and more history.  Patient encouraged to call clinic with any questions, comments, or concerns.  Maximiano Coss, NP

## 2020-09-10 NOTE — Patient Instructions (Signed)
° ° ° °  If you have lab work done today you will be contacted with your lab results within the next 2 weeks.  If you have not heard from us then please contact us. The fastest way to get your results is to register for My Chart. ° ° °IF you received an x-ray today, you will receive an invoice from [facility]. Please contact [facility] at [phone] with questions or concerns regarding your invoice.  ° °IF you received labwork today, you will receive an invoice from [facility]. Please contact [facility] at [phone] with questions or concerns regarding your invoice.  ° °Our billing staff will not be able to assist you with questions regarding bills from these companies. ° °You will be contacted with the lab results as soon as they are available. The fastest way to get your results is to activate your My Chart account. Instructions are located on the last page of this paperwork. If you have not heard from us regarding the results in 2 weeks, please contact this office. °  ° ° ° °

## 2020-09-11 ENCOUNTER — Other Ambulatory Visit: Payer: Self-pay | Admitting: Family Medicine

## 2020-09-11 DIAGNOSIS — M5136 Other intervertebral disc degeneration, lumbar region: Secondary | ICD-10-CM

## 2020-09-11 LAB — COMPREHENSIVE METABOLIC PANEL
ALT: 7 IU/L (ref 0–32)
AST: 10 IU/L (ref 0–40)
Albumin/Globulin Ratio: 1.3 (ref 1.2–2.2)
Albumin: 3.4 g/dL — ABNORMAL LOW (ref 3.6–4.6)
Alkaline Phosphatase: 56 IU/L (ref 44–121)
BUN/Creatinine Ratio: 26 (ref 12–28)
BUN: 24 mg/dL (ref 8–27)
Bilirubin Total: <0.2 mg/dL (ref 0.0–1.2)
CO2: 26 mmol/L (ref 20–29)
Calcium: 9.2 mg/dL (ref 8.7–10.3)
Chloride: 106 mmol/L (ref 96–106)
Creatinine, Ser: 0.94 mg/dL (ref 0.57–1.00)
GFR calc Af Amer: 64 mL/min/{1.73_m2} (ref >59)
GFR calc non Af Amer: 56 mL/min/{1.73_m2} — ABNORMAL LOW (ref >59)
Globulin, Total: 2.7 g/dL (ref 1.5–4.5)
Glucose: 93 mg/dL (ref 65–99)
Potassium: 4.4 mmol/L (ref 3.5–5.2)
Sodium: 142 mmol/L (ref 134–144)
Total Protein: 6.1 g/dL (ref 6.0–8.5)

## 2020-09-11 LAB — LIPASE: Lipase: 43 U/L (ref 14–85)

## 2020-09-11 LAB — AMYLASE: Amylase: 81 U/L (ref 31–110)

## 2020-09-11 LAB — TSH: TSH: 2.19 u[IU]/mL (ref 0.450–4.500)

## 2020-09-11 NOTE — Progress Notes (Signed)
Hey!  Same thing here - had a poct that wasn't resulted, if we could turn it into a send out, that would be great.  Thank you  Kathrin Ruddy, NP

## 2020-09-11 NOTE — Telephone Encounter (Signed)
Requested medication (s) are due for refill today: yes  Requested medication (s) are on the active medication list: yes  Last refill:  08/19/20  Future visit scheduled: yes  Notes to clinic:  med not delegated to NT to RF   Requested Prescriptions  Pending Prescriptions Disp Refills   traMADol-acetaminophen (ULTRACET) 37.5-325 MG tablet [Pharmacy Med Name: TRAMADOL/APAP 37.5MG /325MG  TABS] 60 tablet     Sig: TAKE 1 TABLET BY MOUTH EVERY 6 HOURS      Not Delegated - Analgesics:  Opioid Agonist Combinations Failed - 09/11/2020 10:51 AM      Failed - This refill cannot be delegated      Failed - Urine Drug Screen completed in last 360 days.      Passed - Valid encounter within last 6 months    Recent Outpatient Visits           Yesterday Hard stool   [facility], Delfino Lovett, NP   1 month ago Dependent edema   [facility], Ranell Patrick, MD   1 month ago Dependent edema   [facility], Ranell Patrick, MD   2 months ago Fatigue, unspecified type   [facility], Delfino Lovett, NP   4 months ago    [facility], Arlie Solomons, MD       Future Appointments             In 5 days Carlota Raspberry Ranell Patrick, MD [facility] [place], [facility]

## 2020-09-12 ENCOUNTER — Encounter: Payer: Self-pay | Admitting: Podiatry

## 2020-09-12 NOTE — Progress Notes (Signed)
Subjective: [name] is a pleasant 86 y.o. female patient seen today painful mycotic nails b/l that are difficult to trim. Pain interferes with ambulation. Aggravating factors include wearing enclosed shoe gear. Pain is relieved with periodic professional debridement. She voices no new pedal problems on today's visit.  PCP is Dr. Merri Ray and last visit was 08/12/2020.  Patient Active Problem List   Diagnosis Date Noted  . Degenerative disc disease, lumbar 01/01/2018  . Hiatal hernia 11/04/2016  . Acute blood loss anemia   . Gastroesophageal reflux disease without esophagitis 07/31/2014  . Gastric ulcer without hemorrhage or perforation 07/22/2013  . History of syphilis 02/27/2012  . Dementia ([place])   . Spondylosis   . Foot deformity   . Migraine   . CAD (coronary artery disease)   . HSV-2 (herpes simplex virus 2) infection   . Abnormal finding on Pap smear, ASCUS   . Osteoporosis   . CONSTIPATION 12/12/2010  . ANEMIA, IRON DEFICIENCY 06/17/2008  . BARRETTS ESOPHAGUS 06/17/2008  . GLAUCOMA 06/16/2008  . Essential hypertension 06/16/2008  . DIVERTICULOSIS, COLON 06/16/2008  . ARTHRITIS 06/16/2008  . SLEEP APNEA 06/16/2008   No Known Allergies  Objective: Physical Exam  General: [name] is a pleasant 86 y.o. African American female, WD, WN in NAD. AAO x 3.   Vascular:  Capillary fill time to digits <3 seconds b/l lower extremities. Faintly palpable DP pulses b/l. Nonpalpable PT pulse(s) b/l lower extremities. Pedal hair absent b/l. Skin temperature gradient within normal limits b/l. No pain with calf compression b/l. Nonpitting edema noted b/l LE.  Dermatological:  Pedal skin is thin shiny, atrophic bilaterally. No open wounds bilaterally. No interdigital macerations bilaterally. Toenails 1-5 b/l elongated, discolored, dystrophic, thickened, crumbly with subungual debris and tenderness to dorsal palpation. Hyperkeratotic lesion(s) R 2nd toe, R 5th toe, submet head 1  left foot and submet head 1 right foot.  No erythema, no edema, no drainage, no flocculence.  Musculoskeletal:  Normal muscle strength 5/5 to all lower extremity muscle groups bilaterally. No pain crepitus or joint limitation noted with ROM b/l. Hallux valgus with bunion deformity noted b/l lower extremities. Hammertoes noted to the 2-5 bilaterally. Wearing appropriate fitting shoe gear.  Neurological:  Protective sensation intact 5/5 intact bilaterally with 10g monofilament b/l. Vibratory sensation intact b/l. Proprioception intact bilaterally.  Assessment and Plan:  1. Pain due to onychomycosis of toenail   2. Corns and callosities   3. PAD (peripheral artery disease) (HCC)    -Examined patient. -Toenails 1-5 b/l were debrided in length and girth with sterile nail nippers and dremel without iatrogenic bleeding.  -Corn(s) R 2nd toe and R 5th toe and callus(es) submet head 1 left foot and submet head 1 right foot were pared utilizing sterile scalpel blade without incident. Total number debrided =4. -Patient to report any pedal injuries to medical professional immediately. -Patient to continue soft, supportive shoe gear daily. -Patient/POA to call should there be question/concern in the interim.  Return in about 3 months (around 12/08/2020) for nail and callus trim.  Marzetta Board, DPM

## 2020-09-14 NOTE — Telephone Encounter (Signed)
Requested medication (s) are due for refill today: Yes  Requested medication (s) are on the active medication list: Yes  Last refill:  08/18/20  Future visit scheduled: Yes  Notes to clinic:  Unable to refill, cannot delegate     Requested Prescriptions  Pending Prescriptions Disp Refills   traMADol-acetaminophen (ULTRACET) 37.5-325 MG tablet [Pharmacy Med Name: TRAMADOL/APAP 37.5MG /325MG  TABS] 60 tablet     Sig: TAKE 1 TABLET BY MOUTH EVERY 6 HOURS      Not Delegated - Analgesics:  Opioid Agonist Combinations Failed - 09/14/2020 10:43 AM      Failed - This refill cannot be delegated      Failed - Urine Drug Screen completed in last 360 days.      Passed - Valid encounter within last 6 months    Recent Outpatient Visits           4 days ago Hard stool   [facility], Delfino Lovett, NP   1 month ago Dependent edema   [facility], Ranell Patrick, MD   2 months ago Dependent edema   [facility], Ranell Patrick, MD   3 months ago Fatigue, unspecified type   [facility], Delfino Lovett, NP   4 months ago    [facility], Arlie Solomons, MD       Future Appointments             In 2 days Wendie Agreste, MD Primary Care at [place], [facility]

## 2020-09-14 NOTE — Telephone Encounter (Signed)
Requested medication (s) are due for refill today: Yes  Requested medication (s) are on the active medication list: Yes  Last refill:  08/18/20  Future visit scheduled: Yes  Notes to clinic: Unable to refill per protocol, cannot delegate     Requested Prescriptions  Pending Prescriptions Disp Refills   traMADol-acetaminophen (ULTRACET) 37.5-325 MG tablet [Pharmacy Med Name: TRAMADOL/APAP 37.5MG /325MG  TABS] 60 tablet     Sig: TAKE 1 TABLET BY MOUTH EVERY 6 HOURS      Not Delegated - Analgesics:  Opioid Agonist Combinations Failed - 09/14/2020  3:50 PM      Failed - This refill cannot be delegated      Failed - Urine Drug Screen completed in last 360 days.      Passed - Valid encounter within last 6 months    Recent Outpatient Visits           4 days ago Hard stool   [facility], Delfino Lovett, NP   1 month ago Dependent edema   [facility], Ranell Patrick, MD   2 months ago Dependent edema   [facility], Ranell Patrick, MD   3 months ago Fatigue, unspecified type   [facility], Delfino Lovett, NP   4 months ago    [facility], Arlie Solomons, MD       Future Appointments             In 3 weeks Carlota Raspberry Ranell Patrick, MD Primary Care at [place], [facility]

## 2020-09-14 NOTE — Telephone Encounter (Signed)
Please clarify refill request.  It appears #60 of tramadol were prescribed on August 27, and when we discussed her pain medications in July she was taking 1 pill/day.  Has she increased dosing?

## 2020-09-14 NOTE — Telephone Encounter (Signed)
Please see my previous notes.

## 2020-09-14 NOTE — Telephone Encounter (Signed)
Patient is requesting a refill of the following medications: Requested Prescriptions   Pending Prescriptions Disp Refills  . traMADol-acetaminophen (ULTRACET) 37.5-325 MG tablet [Pharmacy Med Name: TRAMADOL/APAP 37.5MG /325MG  TABS] 60 tablet     Sig: TAKE 1 TABLET BY MOUTH EVERY 6 HOURS    Date of patient request: 09/11/2020 Last office visit: 09/10/2020 Date of last refill: 08/19/2020 Last refill amount: 60 tablets Follow up time period per chart: 09/16/2020

## 2020-09-16 ENCOUNTER — Ambulatory Visit: Payer: Medicare Other | Admitting: Family Medicine

## 2020-09-23 DIAGNOSIS — Z961 Presence of intraocular lens: Secondary | ICD-10-CM | POA: Diagnosis not present

## 2020-09-23 DIAGNOSIS — H401131 Primary open-angle glaucoma, bilateral, mild stage: Secondary | ICD-10-CM | POA: Diagnosis not present

## 2020-09-23 DIAGNOSIS — H04123 Dry eye syndrome of bilateral lacrimal glands: Secondary | ICD-10-CM | POA: Diagnosis not present

## 2020-09-23 DIAGNOSIS — H1045 Other chronic allergic conjunctivitis: Secondary | ICD-10-CM | POA: Diagnosis not present

## 2020-09-27 ENCOUNTER — Other Ambulatory Visit: Payer: Self-pay | Admitting: Family Medicine

## 2020-09-27 DIAGNOSIS — M5136 Other intervertebral disc degeneration, lumbar region: Secondary | ICD-10-CM

## 2020-09-27 NOTE — Telephone Encounter (Signed)
Medication Refill - Medication: traMADol-acetaminophen (ULTRACET) 37.5-325 MG tablet   Has the patient contacted their pharmacy? yes (Agent: If no, request that the patient contact the pharmacy for the refill.) (Agent: If yes, when and what did the pharmacy advise?)Contact PCP  Preferred Pharmacy (with phone number or street name):  [facility] [phone] - [place], [address] AT [address] Phone:  [phone]  Fax:  [phone]       Agent: Please be advised that RX refills may take up to 3 business days. We ask that you follow-up with your pharmacy.

## 2020-09-27 NOTE — Telephone Encounter (Signed)
Patient is requesting a refill of the following medications: Requested Prescriptions   Pending Prescriptions Disp Refills   traMADol-acetaminophen (ULTRACET) 37.5-325 MG tablet 60 tablet 0    Sig: Take 1 tablet by mouth every 6 (six) hours.    Date of patient request: 09/27/2020 Last office visit: 08/12/2020 Date of last refill: 08/19/2020 Last refill amount: 60tab Follow up time period per chart: 6 weeks

## 2020-09-27 NOTE — Telephone Encounter (Signed)
Requested medication (s) are due for refill today: yes   Requested medication (s) are on the active medication list: yes  Last refill:  08/19/20 #60 0 refills   Future visit scheduled: yes in 2 weeks   Notes to clinic:  not delegated per protocol      Requested Prescriptions  Pending Prescriptions Disp Refills   traMADol-acetaminophen (ULTRACET) 37.5-325 MG tablet 60 tablet 0    Sig: Take 1 tablet by mouth every 6 (six) hours.      Not Delegated - Analgesics:  Opioid Agonist Combinations Failed - 09/27/2020 12:59 PM      Failed - This refill cannot be delegated      Failed - Urine Drug Screen completed in last 360 days.      Passed - Valid encounter within last 6 months    Recent Outpatient Visits           2 weeks ago Hard stool   [facility], Delfino Lovett, NP   1 month ago Dependent edema   [facility], Ranell Patrick, MD   2 months ago Dependent edema   [facility], Ranell Patrick, MD   3 months ago Fatigue, unspecified type   [facility], Delfino Lovett, NP   5 months ago    [facility], Arlie Solomons, MD       Future Appointments             In 2 weeks Carlota Raspberry Ranell Patrick, MD Primary Care at [place], [facility]

## 2020-09-28 MED ORDER — TRAMADOL-ACETAMINOPHEN 37.5-325 MG PO TABS: 1.0000 | ORAL_TABLET | Freq: Four times a day (QID) | ORAL | 0 refills | Status: DC

## 2020-09-30 ENCOUNTER — Other Ambulatory Visit: Payer: Self-pay | Admitting: Family Medicine

## 2020-09-30 ENCOUNTER — Telehealth: Payer: Self-pay | Admitting: Family Medicine

## 2020-09-30 NOTE — Telephone Encounter (Signed)
Medication Refill - Medication: tizanidine   Has the patient contacted their pharmacy? Yes.   (Agent: If no, request that the patient contact the pharmacy for the refill.) (Agent: If yes, when and what did the pharmacy advise?)  Preferred Pharmacy (with phone number or street name):  [facility] [phone] - [place], [place] - [address] AT [address]  [place] [address]  Phone: [phone] Fax: [phone]  Hours: Not open 24 hours     Agent: Please be advised that RX refills may take up to 3 business days. We ask that you follow-up with your pharmacy.

## 2020-09-30 NOTE — Telephone Encounter (Signed)
Requested medication (s) are due for refill today: yes   Requested medication (s) are on the active medication list: yes   Last refill:  08/29/20 #30 0 refills  Future visit scheduled: yes 1 week   Notes to clinic:  not delegated per protocol     Requested Prescriptions  Pending Prescriptions Disp Refills   tiZANidine (ZANAFLEX) 4 MG tablet 30 tablet 0    Sig: Take 1 tablet (4 mg total) by mouth every 6 (six) hours as needed for muscle spasms.      Not Delegated - Cardiovascular:  Alpha-2 Agonists - tizanidine Failed - 09/30/2020 10:46 AM      Failed - This refill cannot be delegated      Passed - Valid encounter within last 6 months    Recent Outpatient Visits           2 weeks ago Hard stool   [facility], Delfino Lovett, NP   1 month ago Dependent edema   [facility], Ranell Patrick, MD   2 months ago Dependent edema   [facility], Ranell Patrick, MD   3 months ago Fatigue, unspecified type   [facility], Delfino Lovett, NP   5 months ago    [facility], Arlie Solomons, MD       Future Appointments             In 1 week Carlota Raspberry Ranell Patrick, MD Primary Care at [place], [facility]

## 2020-09-30 NOTE — Telephone Encounter (Signed)
Spoke with patient. Patient would like to get assessed for a back brace. Patient was informed she would need a office visit. Patient is scheduled next week 10/11/20 and will take to the doctor at her appt.

## 2020-09-30 NOTE — Telephone Encounter (Signed)
Received a after hours fax stating that pt back hurts all the time and she wants an assessment for a back brace or something to help with her mobility. Please advise.

## 2020-10-04 ENCOUNTER — Other Ambulatory Visit: Payer: Self-pay | Admitting: Gastroenterology

## 2020-10-04 NOTE — Telephone Encounter (Signed)
OK to refill? Last seen 04-2019.  Do you want her to have an OV or OK to refill? Thanks

## 2020-10-05 ENCOUNTER — Ambulatory Visit: Payer: Medicare Other

## 2020-10-05 ENCOUNTER — Other Ambulatory Visit: Payer: Self-pay

## 2020-10-05 ENCOUNTER — Ambulatory Visit (INDEPENDENT_AMBULATORY_CARE_PROVIDER_SITE_OTHER): Payer: Medicare Other | Admitting: Registered Nurse

## 2020-10-05 DIAGNOSIS — Z23 Encounter for immunization: Secondary | ICD-10-CM

## 2020-10-05 NOTE — Telephone Encounter (Signed)
Okay to refill thanks

## 2020-10-06 ENCOUNTER — Other Ambulatory Visit: Payer: Self-pay | Admitting: Family Medicine

## 2020-10-06 NOTE — Telephone Encounter (Signed)
Requested medication (s) are due for refill today: yes  Requested medication (s) are on the active medication list: yes  Last refill: 09/30/20  Future visit scheduled: yes  Notes to clinic: not delegated; script per Loura Halt, NP    Requested Prescriptions  Pending Prescriptions Disp Refills   tiZANidine (ZANAFLEX) 4 MG tablet 30 tablet 0    Sig: Take 1 tablet (4 mg total) by mouth every 6 (six) hours as needed for muscle spasms.      Not Delegated - Cardiovascular:  Alpha-2 Agonists - tizanidine Failed - 10/06/2020 12:44 PM      Failed - This refill cannot be delegated      Passed - Valid encounter within last 6 months    Recent Outpatient Visits           3 weeks ago Hard stool   [facility], Delfino Lovett, NP   1 month ago Dependent edema   [facility], Ranell Patrick, MD   2 months ago Dependent edema   [facility], Ranell Patrick, MD   3 months ago Fatigue, unspecified type   [facility], Delfino Lovett, NP   5 months ago    [facility], Arlie Solomons, MD       Future Appointments             In 5 days Carlota Raspberry Ranell Patrick, MD Primary Care at [place], [facility]

## 2020-10-11 ENCOUNTER — Other Ambulatory Visit: Payer: Self-pay

## 2020-10-11 ENCOUNTER — Ambulatory Visit (INDEPENDENT_AMBULATORY_CARE_PROVIDER_SITE_OTHER): Payer: Medicare Other | Admitting: Family Medicine

## 2020-10-11 ENCOUNTER — Encounter: Payer: Self-pay | Admitting: Family Medicine

## 2020-10-11 VITALS — BP 142/84 | HR 90 | Temp 98.3°F | Resp 15 | Ht <= 58 in | Wt 111.0 lb

## 2020-10-11 DIAGNOSIS — R609 Edema, unspecified: Secondary | ICD-10-CM

## 2020-10-11 DIAGNOSIS — N898 Other specified noninflammatory disorders of vagina: Secondary | ICD-10-CM

## 2020-10-11 DIAGNOSIS — R195 Other fecal abnormalities: Secondary | ICD-10-CM

## 2020-10-11 NOTE — Patient Instructions (Addendum)
Try starting back on estrace vaginal cream as prescribed, then if not helping the itching - follow up with Dr. Benjie Karvonen.   Take Colace daily for stool softener.  If that is not helping, then you can try miralax every 2-3 days until bowel movements softer, but that can be taken daily if needed for constipation.   Stockings if needed for swelling in legs, but looks ok today. I do not thinks those are needed based on your exam today.   Constipation, Adult Constipation is when a person:  Poops (has a bowel movement) fewer times in a week than normal.  Has a hard time pooping.  Has poop that is dry, hard, or bigger than normal. Follow these instructions at home: Eating and drinking   Eat foods that have a lot of fiber, such as: ? Fresh fruits and vegetables. ? Whole grains. ? Beans.  Eat less of foods that are high in fat, low in fiber, or overly processed, such as: ? [place] fries. ? Hamburgers. ? Cookies. ? Candy. ? Soda.  Drink enough fluid to keep your pee (urine) clear or pale yellow. General instructions  Exercise regularly or as told by your doctor.  Go to the restroom when you feel like you need to poop. Do not hold it in.  Take over-the-counter and prescription medicines only as told by your doctor. These include any fiber supplements.  Do pelvic floor retraining exercises, such as: ? Doing deep breathing while relaxing your lower belly (abdomen). ? Relaxing your pelvic floor while pooping.  Watch your condition for any changes.  Keep all follow-up visits as told by your doctor. This is important. Contact a doctor if:  You have pain that gets worse.  You have a fever.  You have not pooped for 4 days.  You throw up (vomit).  You are not hungry.  You lose weight.  You are bleeding from the anus.  You have thin, pencil-like poop (stool). Get help right away if:  You have a fever, and your symptoms suddenly get worse.  You leak poop or have blood in your  poop.  Your belly feels hard or bigger than normal (is bloated).  You have very bad belly pain.  You feel dizzy or you faint. This information is not intended to replace advice given to you by your health care provider. Make sure you discuss any questions you have with your health care provider. Document Revised: 11/23/2017 Document Reviewed: 05/31/2016 Elsevier Patient Education  [employer].   If you have lab work done today you will be contacted with your lab results within the next 2 weeks.  If you have not heard from [place] then please contact us. The fastest way to get your results is to register for My Chart.   IF you received an x-ray today, you will receive an invoice from [facility]. Please contact [facility] at [phone] with questions or concerns regarding your invoice.   IF you received labwork today, you will receive an invoice from [facility]. Please contact [facility] at [phone] with questions or concerns regarding your invoice.   Our billing staff will not be able to assist you with questions regarding bills from these companies.  You will be contacted with the lab results as soon as they are available. The fastest way to get your results is to activate your My Chart account. Instructions are located on the last page of this paperwork. If you have not heard from [place] regarding the results  in 2 weeks, please contact this office.

## 2020-10-11 NOTE — Progress Notes (Signed)
Subjective:  Patient ID: [name], female    DOB: September 05, 1933  Age: 86 y.o. MRN: [id number]  CC:  Chief Complaint  Patient presents with  . Leg Swelling    pt notes swelling the past 2-3 weeks and has been trying to keep this down elevating legs and taking meds as prescribed but pt is concerned about varicose veins as well  . Vaginal Itching    pt notes she has having vaginal itching for several weeks and has some mild discharge unsure of order     HPI Ainhoa Rallo presents for   Pedal edema: See discussion August 19, had normal brain natruretic I, normal creatinine, no proteinuria on urinalysis evaluation CBC.  Previously had recommended increasing HCTZ to few days per week.  She was tolerating 3 times per week dosing at her August 17 visit.  Symptoms do improve with supination/overnight.  No sign of heart failure noted on exam.  Compression stockings as option. Still using hctz 3 times week.  Not using stockings.  Swelling about the same. Gets better overnight/with elevation.    Vaginal itching Ongoing symptoms for years - on outside. Itches on outside.  No recent changes.  No recent use of premarin vaginal cream, no side effects. thinks she may start using it. Has at home.  Denies new vaginal discharge.  Uses hydrocortisone 2.5% topical daily? Saw Dr. Benjie Karvonen in OCt 2020 for pruritus vulvae.   Constipation: Hard stools still present with hard balls of stool - most days. Not using miralax (recommended at 9/17 visit).  Using Colace MWF.   History Patient Active Problem List   Diagnosis Date Noted  . Degenerative disc disease, lumbar 01/01/2018  . Hiatal hernia 11/04/2016  . Acute blood loss anemia   . Gastroesophageal reflux disease without esophagitis 07/31/2014  . Gastric ulcer without hemorrhage or perforation 07/22/2013  . History of syphilis 02/27/2012  . Dementia ([place])   . Spondylosis   . Foot deformity   . Migraine   . CAD (coronary artery disease)   . HSV-2  (herpes simplex virus 2) infection   . Abnormal finding on Pap smear, ASCUS   . Osteoporosis   . CONSTIPATION 12/12/2010  . ANEMIA, IRON DEFICIENCY 06/17/2008  . BARRETTS ESOPHAGUS 06/17/2008  . GLAUCOMA 06/16/2008  . Essential hypertension 06/16/2008  . DIVERTICULOSIS, COLON 06/16/2008  . ARTHRITIS 06/16/2008  . SLEEP APNEA 06/16/2008   Past Medical History:  Diagnosis Date  . Abnormal finding on Pap smear, ASCUS   . Acute renal insufficiency   . Amputation finger    index  . Barrett's esophagus   . CAD (coronary artery disease)   . Dementia ([place])   . Diverticulosis   . Elevated BP   . Foot deformity   . GERD (gastroesophageal reflux disease)   . Glaucoma   . H/O: GI bleed   . HSV-2 (herpes simplex virus 2) infection   . Hyperglycemia   . Hypertension   . Migraine   . Osteoporosis    worsening  . Spondylosis    Past Surgical History:  Procedure Laterality Date  . AMPUTATION FINGER / THUMB     secondary to osteomyelitis  . CATARACT EXTRACTION, BILATERAL Bilateral 12/25/2016  . COLONOSCOPY    . ESOPHAGOGASTRODUODENOSCOPY N/A 10/22/2016   Procedure: ESOPHAGOGASTRODUODENOSCOPY (EGD);  Surgeon: Doran Stabler, MD;  Location: [facility] ENDOSCOPY;  Service: Endoscopy;  Laterality: N/A;  . FOOT SURGERY     right  . PARTIAL HYSTERECTOMY    . UPPER  GASTROINTESTINAL ENDOSCOPY     No Known Allergies Prior to Admission medications   Medication Sig Start Date End Date Taking? Authorizing Provider  Calcium Carbonate-Vitamin D (CALCIUM-VITAMIN D) 500-200 MG-UNIT tablet Take 3 tablets by mouth every morning.    [provider]  calcium-vitamin D (OSCAL WITH D) 500-200 MG-UNIT TABS tablet calcium carbonate 500 mg (1,250 mg)-vitamin D3 200 unit tablet   3 tbl by oral route.    [provider]  diclofenac Sodium (VOLTAREN) 1 % GEL Voltaren 1 % topical gel    [provider]  Elastic Bandages & Supports (LUMBAR BACK BRACE/SUPPORT PAD) MISC Dispense one back  brace to be worn while doing activities as needed daily 11/03/19   Delia Chimes A, MD  ESTRACE VAGINAL 0.1 MG/GM vaginal cream INSERT 1 APPLICATORFUL VAGINALLY ONCE A WEEK 07/15/19   Forrest Moron, MD  FEROSUL 325 (65 Fe) MG tablet TAKE 1 TABLET(325 MG) BY MOUTH DAILY 01/12/20   Forrest Moron, MD  FEROSUL 325 (65 Fe) MG tablet TK 1 T PO QD 04/28/20   Forrest Moron, MD  gabapentin (NEURONTIN) 800 MG tablet TAKE 1 TABLET(800 MG) BY MOUTH AT BEDTIME 09/30/20   Wendie Agreste, MD  hydrochlorothiazide (MICROZIDE) 12.5 MG capsule TAKE 1 CAPSULE BY MOUTH EVERY Monday Wednesday AND Friday 07/05/20   Maximiano Coss, NP  hydrocortisone 2.5 % ointment APPLY EXTERNALLY TO THE AFFECTED AREA TWICE DAILY 05/31/18   Wardell Honour, MD  hydroxypropyl methylcellulose (ISOPTO TEARS) 2.5 % ophthalmic solution Place 1 drop into both eyes 2 (two) times daily as needed for dry eyes.     [provider]  lactose free nutrition (BOOST) LIQD Take 237 mLs by mouth 2 (two) times daily between meals. Patient taking differently: Take 1 Container by mouth 2 (two) times daily as needed (nutritional).  03/22/16   [name], MD  latanoprost (XALATAN) 0.005 % ophthalmic solution INT 1 GTT IN OD QD 08/26/19   [provider]  lidocaine (LIDODERM) 5 % Place 1 patch onto the skin daily. Remove & Discard patch within 12 hours or as directed by MD.  Apply to the right side of your neck where it hurts 05/25/18   Blanchie Dessert, MD  metoprolol succinate (TOPROL-XL) 50 MG 24 hr tablet TAKE 1 TABLET BY MOUTH EVERY DAY WITH OR IMMEDIATELY FOLLOWING A MEAL 03/03/20   Forrest Moron, MD  NON FORMULARY     [provider]  nystatin cream (MYCOSTATIN) Apply to external affected area twice a day for 10 days as needed. 11/08/19   Forrest Moron, MD  ofloxacin (OCUFLOX) 0.3 % ophthalmic solution 10 drops in right ear once daily for 5 days. 01/29/19   Tasia Catchings, Amy V, PA-C  pantoprazole (PROTONIX) 40 MG tablet TAKE 1  TABLET(40 MG) BY MOUTH TWICE DAILY 10/05/20   Armbruster, Carlota Raspberry, MD  PREMARIN vaginal cream  10/10/19   [provider]  promethazine (PHENERGAN) 12.5 MG tablet take 1 tablet by mouth every 6 hours if needed for nausea and vomiting 11/03/19   Delia Chimes A, MD  sucralfate (CARAFATE) 1 g tablet Take 1 tablet (1 g total) by mouth every 6 (six) hours as needed. Slowly dissolve tablet in 1 Tablespoon of distilled water for 15 minutes before taking (do not crush) 07/15/19   Armbruster, Carlota Raspberry, MD  tiZANidine (ZANAFLEX) 4 MG tablet Take 1 tablet (4 mg total) by mouth every 6 (six) hours as needed for muscle spasms. 08/29/20  Loura Halt A, NP  traMADol (ULTRAM) 50 MG tablet tramadol 50 mg tablet  Take 1 tablet every 6 hours by oral route.    [provider]  traMADol-acetaminophen (ULTRACET) 37.5-325 MG tablet Take 1 tablet by mouth every 6 (six) hours. 09/28/20   Maximiano Coss, NP  triamcinolone cream (KENALOG) 0.1 % Apply topically 2 (two) times daily. 09/04/18   Forrest Moron, MD  verapamil (CALAN-SR) 240 MG CR tablet Take 1 tablet (240 mg total) by mouth daily. 11/03/19   Forrest Moron, MD  vitamin C (ASCORBIC ACID) 500 MG tablet Take 500 mg by mouth daily.    [provider]  ZINC OXIDE, TOPICAL, 10 % CREA 1 application to affected area to prevent moisture and further irritation of skin. 01/29/19   Ok Edwards, PA-C   Social History   Socioeconomic History  . Marital status: Single    Spouse name: Not on file  . Number of children: 1  . Years of education: Not on file  . Highest education level: 12th grade  Occupational History    Employer: RETIRED  Tobacco Use  . Smoking status: Never Smoker  . Smokeless tobacco: Never Used  Vaping Use  . Vaping Use: Never used  Substance and Sexual Activity  . Alcohol use: No    Alcohol/week: 0.0 standard drinks  . Drug use: No  . Sexual activity: Never    Birth control/protection: Post-menopausal  Other Topics  Concern  . Not on file  Social History Narrative   Marital status: divorced.      Children: 1 son; no grandchildren      Lives: with son      Employment: retired      Tobacco: none      Alcohol: none      Drugs: none       Exercise: none       ADLs:  Independent with ADLs.  Quit driving age 20.  Medical CNA cleans house; makes breakfast; patient pays bills.      Advanced Directives:   YES.  HCPOA:  Son/Isaac Bowman.  FULL CODE; no prolonged measures.   Social Determinants of Health   Financial Resource Strain:   . Difficulty of Paying Living Expenses: Not on file  Food Insecurity:   . Worried About Charity fundraiser in the Last Year: Not on file  . Ran Out of Food in the Last Year: Not on file  Transportation Needs:   . Lack of Transportation (Medical): Not on file  . Lack of Transportation (Non-Medical): Not on file  Physical Activity:   . Days of Exercise per Week: Not on file  . Minutes of Exercise per Session: Not on file  Stress:   . Feeling of Stress : Not on file  Social Connections:   . Frequency of Communication with Friends and Family: Not on file  . Frequency of Social Gatherings with Friends and Family: Not on file  . Attends Religious Services: Not on file  . Active Member of Clubs or Organizations: Not on file  . Attends Archivist Meetings: Not on file  . Marital Status: Not on file  Intimate Partner Violence:   . Fear of Current or Ex-Partner: Not on file  . Emotionally Abused: Not on file  . Physically Abused: Not on file  . Sexually Abused: Not on file    Review of Systems Per HPI.   Objective:   Vitals:   10/11/20 1429 10/11/20 1431  BP: (!) 149/83 (!) 142/84  Pulse: 90   Resp: 15   Temp: 98.3 F (36.8 C)   TempSrc: Temporal   SpO2: 97%   Weight: 111 lb (50.3 kg)   Height: 4\' 10"  (1.473 m)     Physical Exam Vitals reviewed.  Constitutional:      Appearance: She is well-developed.  HENT:     Head: Normocephalic and  atraumatic.  Eyes:     Conjunctiva/sclera: Conjunctivae normal.     Pupils: Pupils are equal, round, and reactive to light.  Neck:     Vascular: No carotid bruit.  Cardiovascular:     Rate and Rhythm: Normal rate and regular rhythm.     Heart sounds: Normal heart sounds.  Pulmonary:     Effort: Pulmonary effort is normal.     Breath sounds: Normal breath sounds.  Abdominal:     Palpations: Abdomen is soft. There is no pulsatile mass.     Tenderness: There is no abdominal tenderness.  Musculoskeletal:     Right lower leg: No edema.     Left lower leg: No edema.  Skin:    General: Skin is warm and dry.  Neurological:     Mental Status: She is alert and oriented to person, place, and time.  Psychiatric:        Behavior: Behavior normal.    No pedal edema appreciated today.   Assessment & Plan:  Darlyne Schmiesing is a 86 y.o. female . Hard stool  -Longstanding constipation, symptomatic care discussed with Colace daily, option of MiraLAX if needed.  RTC precautions.   Vaginal itching  -Longstanding symptoms with vaginal pruritus, has been evaluated previously by OB/GYN.  Restart Estrace vaginal cream, then if not improving did recommend follow-up with OB/GYN.  Denies any acute changes or discharge.  If those do occur, follow-up recommended for exam either here or with OB/GYN.  Understanding expressed.  Dependent edema  -No appreciable edema on exam at this time.  Option of compressive stockings, but would avoid this given her current exam.  RTC precautions given.  No orders of the defined types were placed in this encounter.  Patient Instructions   Try starting back on estrace vaginal cream as prescribed, then if not helping the itching - follow up with Dr. Benjie Karvonen.   Take Colace daily for stool softener.  If that is not helping, then you can try miralax every 2-3 days until bowel movements softer, but that can be taken daily if needed for constipation.   Stockings if needed for  swelling in legs, but looks ok today. I do not thinks those are needed based on your exam today.   Constipation, Adult Constipation is when a person:  Poops (has a bowel movement) fewer times in a week than normal.  Has a hard time pooping.  Has poop that is dry, hard, or bigger than normal. Follow these instructions at home: Eating and drinking   Eat foods that have a lot of fiber, such as: ? Fresh fruits and vegetables. ? Whole grains. ? Beans.  Eat less of foods that are high in fat, low in fiber, or overly processed, such as: ? [place] fries. ? Hamburgers. ? Cookies. ? Candy. ? Soda.  Drink enough fluid to keep your pee (urine) clear or pale yellow. General instructions  Exercise regularly or as told by your doctor.  Go to the restroom when you feel like you need to poop. Do not hold it in.  Take over-the-counter and  prescription medicines only as told by your doctor. These include any fiber supplements.  Do pelvic floor retraining exercises, such as: ? Doing deep breathing while relaxing your lower belly (abdomen). ? Relaxing your pelvic floor while pooping.  Watch your condition for any changes.  Keep all follow-up visits as told by your doctor. This is important. Contact a doctor if:  You have pain that gets worse.  You have a fever.  You have not pooped for 4 days.  You throw up (vomit).  You are not hungry.  You lose weight.  You are bleeding from the anus.  You have thin, pencil-like poop (stool). Get help right away if:  You have a fever, and your symptoms suddenly get worse.  You leak poop or have blood in your poop.  Your belly feels hard or bigger than normal (is bloated).  You have very bad belly pain.  You feel dizzy or you faint. This information is not intended to replace advice given to you by your health care provider. Make sure you discuss any questions you have with your health care provider. Document Revised: 11/23/2017  Document Reviewed: 05/31/2016 Elsevier Patient Education  [employer].   If you have lab work done today you will be contacted with your lab results within the next 2 weeks.  If you have not heard from [place] then please contact us. The fastest way to get your results is to register for My Chart.   IF you received an x-ray today, you will receive an invoice from [facility]. Please contact [facility] at [phone] with questions or concerns regarding your invoice.   IF you received labwork today, you will receive an invoice from [facility]. Please contact [facility] at [phone] with questions or concerns regarding your invoice.   Our billing staff will not be able to assist you with questions regarding bills from these companies.  You will be contacted with the lab results as soon as they are available. The fastest way to get your results is to activate your My Chart account. Instructions are located on the last page of this paperwork. If you have not heard from [place] regarding the results in 2 weeks, please contact this office.         Signed, Merri Ray, MD [facility]

## 2020-10-12 ENCOUNTER — Telehealth: Payer: Self-pay | Admitting: Family Medicine

## 2020-10-12 NOTE — Telephone Encounter (Signed)
Medications filled by a different provider   review for refill  Requested Prescriptions  Pending Prescriptions Disp Refills   tiZANidine (ZANAFLEX) 4 MG tablet 30 tablet 0    Sig: Take 1 tablet (4 mg total) by mouth every 6 (six) hours as needed for muscle spasms.      Not Delegated - Cardiovascular:  Alpha-2 Agonists - tizanidine Failed - 10/12/2020  8:59 AM      Failed - This refill cannot be delegated      Passed - Valid encounter within last 6 months    Recent Outpatient Visits           Yesterday Hard stool   [facility], Ranell Patrick, MD   1 month ago Hard stool   [facility], Delfino Lovett, NP   2 months ago Dependent edema   [facility], Ranell Patrick, MD   2 months ago Dependent edema   [facility], Ranell Patrick, MD   3 months ago Fatigue, unspecified type   [facility], Delfino Lovett, NP       Future Appointments             In 6 months Carlota Raspberry Ranell Patrick, MD [facility] [place], [facility]              traMADol (ULTRAM) 50 MG tablet 30 tablet     Sig: tramadol 50 mg tablet  Take 1 tablet every 6 hours by oral route.      Not Delegated - Analgesics:  Opioid Agonists Failed - 10/12/2020  8:59 AM      Failed - This refill cannot be delegated      Failed - Urine Drug Screen completed in last 360 days.      Passed - Valid encounter within last 6 months    Recent Outpatient Visits           Yesterday Hard stool   [facility], Ranell Patrick, MD   1 month ago Hard stool   [facility], Delfino Lovett, NP   2 months ago Dependent edema   [facility], Ranell Patrick, MD   2 months ago Dependent edema   [facility], Ranell Patrick, MD   3 months ago Fatigue, unspecified type   [facility], Delfino Lovett, NP       Future Appointments             In 6 months Carlota Raspberry Ranell Patrick, MD [facility] at [place], [facility]

## 2020-10-12 NOTE — Telephone Encounter (Signed)
PT need a refill  traMADol-acetaminophen (ULTRACET) 37.5-325 MG tablet [[id number]]  tiZANidine (ZANAFLEX) 4 MG tablet [[id number]]  [id number] - [address], [address] AT [address]  [place], [place] [address]  Phone:  [phone] Fax:  [phone]

## 2020-10-13 ENCOUNTER — Telehealth: Payer: Self-pay | Admitting: Family Medicine

## 2020-10-13 NOTE — Telephone Encounter (Signed)
Pt is calling and is wanting to know what size of compression socks provider is wanting her to get. Pt would like pts nurse to call her when the recommend size. Please advise.

## 2020-10-14 ENCOUNTER — Other Ambulatory Visit: Payer: Self-pay | Admitting: Family Medicine

## 2020-10-14 ENCOUNTER — Other Ambulatory Visit: Payer: Self-pay

## 2020-10-14 DIAGNOSIS — M5136 Other intervertebral disc degeneration, lumbar region: Secondary | ICD-10-CM

## 2020-10-14 NOTE — Telephone Encounter (Signed)
What is the name of the medication? tiZANidine (ZANAFLEX) 4 MG tablet [[id number]], traMADol-acetaminophen (ULTRACET) 37.5-325 MG tablet [[id number]] and nystatin cream (MYCOSTATIN) [[id number]]    Have you contacted your pharmacy to request a refill? Yes she needs refills.   Which pharmacy would you like this sent to? Pharmacy  [phone] - [place], [place] - [address] [place] AT [address]  [address] [place] [address]  Phone:  [phone]  Fax:  [phone]  DEA #:  [id number]     Patient notified that their request is being sent to the clinical staff for review and that they should receive a call once it is complete. If they do not receive a call within 72 hours they can check with their pharmacy or our office.

## 2020-10-14 NOTE — Telephone Encounter (Signed)
p 

## 2020-10-14 NOTE — Telephone Encounter (Signed)
Requested medication (s) are due for refill today - unsure  Requested medication (s) are on the active medication list -yes  Future visit scheduled -yes  Last refill: 09/28/20  Notes to clinic: Request RF- non delegated rx  Requested Prescriptions  Pending Prescriptions Disp Refills   traMADol-acetaminophen (ULTRACET) 37.5-325 MG tablet [Pharmacy Med Name: TRAMADOL/APAP 37.5MG /325MG  TABS] 60 tablet     Sig: TAKE 1 TABLET BY MOUTH EVERY 6 HOURS      Not Delegated - Analgesics:  Opioid Agonist Combinations Failed - 10/14/2020 11:11 AM      Failed - This refill cannot be delegated      Failed - Urine Drug Screen completed in last 360 days.      Passed - Valid encounter within last 6 months    Recent Outpatient Visits           3 days ago Hard stool   [facility], Ranell Patrick, MD   1 month ago Hard stool   [facility], Delfino Lovett, NP   2 months ago Dependent edema   [facility], Ranell Patrick, MD   3 months ago Dependent edema   [facility], Ranell Patrick, MD   4 months ago Fatigue, unspecified type   [facility], Delfino Lovett, NP       Future Appointments             In 5 months Carlota Raspberry Ranell Patrick, MD [facility] at [place], [facility]                Requested Prescriptions  Pending Prescriptions Disp Refills   traMADol-acetaminophen (ULTRACET) 37.5-325 MG tablet [Pharmacy Med Name: TRAMADOL/APAP 37.5MG /325MG  TABS] 60 tablet     Sig: TAKE 1 TABLET BY MOUTH EVERY 6 HOURS      Not Delegated - Analgesics:  Opioid Agonist Combinations Failed - 10/14/2020 11:11 AM      Failed - This refill cannot be delegated      Failed - Urine Drug Screen completed in last 360 days.      Passed - Valid encounter within last 6 months    Recent Outpatient Visits           3 days ago Hard stool   [facility], Ranell Patrick, MD   1 month ago Hard stool   [facility], Delfino Lovett, NP    2 months ago Dependent edema   [facility], Ranell Patrick, MD   3 months ago Dependent edema   [facility], Ranell Patrick, MD   4 months ago Fatigue, unspecified type   [facility], Delfino Lovett, NP       Future Appointments             In 5 months Carlota Raspberry, Ranell Patrick, MD [facility] at [place], [facility]

## 2020-10-14 NOTE — Telephone Encounter (Signed)
Please approve or deny, I sent this in another message but cannot approve or deny from this encounter due to credentialling

## 2020-10-14 NOTE — Telephone Encounter (Signed)
Patient is requesting a refill of the following medications: Requested Prescriptions   Pending Prescriptions Disp Refills  . nystatin cream (MYCOSTATIN) 30 g 1    Sig: Apply to external affected area twice a day for 10 days as needed.  Marland Kitchen tiZANidine (ZANAFLEX) 4 MG tablet 30 tablet 0    Sig: Take 1 tablet (4 mg total) by mouth every 6 (six) hours as needed for muscle spasms.  . traMADol-acetaminophen (ULTRACET) 37.5-325 MG tablet 60 tablet 0    Sig: Take 1 tablet by mouth every 6 (six) hours.    Date of patient request: 10/14/2020 Last office visit: 10/11/2020 Date of last refill: 11/08/2019, 08/29/2020, 09/28/2020 Last refill amount: 30, 30, 60 Follow up time period per chart: 4 months

## 2020-10-14 NOTE — Telephone Encounter (Signed)
Could not get through pt line busy and did not pick up, pt will get measured for these unless she is buying OTC then we do not know sizing unfortunately.

## 2020-10-15 ENCOUNTER — Telehealth: Payer: Self-pay | Admitting: Family Medicine

## 2020-10-15 NOTE — Telephone Encounter (Signed)
Controlled substance database (PDMP) reviewed. No concerns appreciated. Last filled 8/27 - refill ordered.

## 2020-10-15 NOTE — Telephone Encounter (Signed)
Requested medication (s) are due for refill today: no  Requested medication (s) are on the active medication list: yes   Last refill:  08/29/2020  Future visit scheduled: yes  Notes to clinic:  this refill cannot be delegated    Requested Prescriptions  Pending Prescriptions Disp Refills   tiZANidine (ZANAFLEX) 4 MG tablet 30 tablet 0    Sig: Take 1 tablet (4 mg total) by mouth every 6 (six) hours as needed for muscle spasms.      Not Delegated - Cardiovascular:  Alpha-2 Agonists - tizanidine Failed - 10/15/2020  9:36 AM      Failed - This refill cannot be delegated      Passed - Valid encounter within last 6 months    Recent Outpatient Visits           4 days ago Hard stool   [facility], Ranell Patrick, MD   1 month ago Hard stool   [facility], Delfino Lovett, NP   2 months ago Dependent edema   [facility], Ranell Patrick, MD   3 months ago Dependent edema   [facility], Ranell Patrick, MD   4 months ago Fatigue, unspecified type   [facility], Delfino Lovett, NP       Future Appointments             In 5 months Carlota Raspberry Ranell Patrick, MD [facility] at [place], [facility]

## 2020-10-15 NOTE — Telephone Encounter (Signed)
More information on tizanidine and nystatin refills.  I do not see where those medicines have been discussed recently.  What is she using these meds and how often?  I did refill Ultracet on separate prescription refill request

## 2020-10-15 NOTE — Telephone Encounter (Signed)
PT calling to F/UP on her meds refill / please advise

## 2020-10-15 NOTE — Telephone Encounter (Signed)
PT need a refill  tiZANidine (ZANAFLEX) 4 MG tablet [[id number]]  [id number] - [place], [address] AT [address]  [place] [address]  Phone: [phone] Fax: [phone]

## 2020-10-19 ENCOUNTER — Encounter (HOSPITAL_COMMUNITY): Payer: Self-pay

## 2020-10-19 ENCOUNTER — Other Ambulatory Visit: Payer: Self-pay

## 2020-10-19 ENCOUNTER — Ambulatory Visit (HOSPITAL_COMMUNITY)
Admission: EM | Admit: 2020-10-19 | Discharge: 2020-10-19 | Disposition: A | Discharge status: Home / Self Care | Payer: Medicare Other | Attending: Family Medicine | Admitting: Family Medicine

## 2020-10-19 ENCOUNTER — Ambulatory Visit (INDEPENDENT_AMBULATORY_CARE_PROVIDER_SITE_OTHER): Payer: Medicare Other

## 2020-10-19 DIAGNOSIS — R52 Pain, unspecified: Secondary | ICD-10-CM | POA: Diagnosis not present

## 2020-10-19 DIAGNOSIS — T1490XA Injury, unspecified, initial encounter: Secondary | ICD-10-CM

## 2020-10-19 DIAGNOSIS — M7731 Calcaneal spur, right foot: Secondary | ICD-10-CM | POA: Diagnosis not present

## 2020-10-19 DIAGNOSIS — S99921A Unspecified injury of right foot, initial encounter: Secondary | ICD-10-CM | POA: Diagnosis not present

## 2020-10-19 DIAGNOSIS — M2011 Hallux valgus (acquired), right foot: Secondary | ICD-10-CM | POA: Diagnosis not present

## 2020-10-19 DIAGNOSIS — M79671 Pain in right foot: Secondary | ICD-10-CM | POA: Diagnosis not present

## 2020-10-19 DIAGNOSIS — I708 Atherosclerosis of other arteries: Secondary | ICD-10-CM | POA: Diagnosis not present

## 2020-10-19 NOTE — ED Triage Notes (Signed)
Pt c/o pain to right third and fourth toes and b/t toes, acute onset yesterday. Denies known trauma/injury to area. States she was sweeping and noticed pain later. Pt has long h/o of second toe crossed over great toe.   Dusky area to third and fourth metatarsal area and erythema noted to skin b/t toes with warmth.

## 2020-10-19 NOTE — Discharge Instructions (Addendum)
Your xray does not show any broken bones.    Take Tylenol as needed for pain.  Rest and elevate your foot.  Apply ice packs 2-3 times a day for up to 15 minutes each.    Follow up with your primary care provider or an orthopedist if you symptoms continue or worsen.

## 2020-10-19 NOTE — ED Provider Notes (Signed)
[facility]    CSN: [id number] Arrival date & time: 10/19/20  1343      History   Chief Complaint Chief Complaint  Patient presents with  . Toe Pain    HPI [name] is a 86 y.o. female.   Patient presents with pain and bruising of her right foot at the base of her third and fourth toes x1 day.  She does not know of any specific injury.  She denies numbness, paresthesias, weakness.  No open wounds.  Her medical history includes osteoporosis, spondylosis, hypertension, dementia, GI bleed, GERD, hyperglycemia, diverticulosis, renal insufficiency, Barrett's esophagitis, HSV, anemia, glaucoma.  The history is provided by the patient.    Past Medical History:  Diagnosis Date  . Abnormal finding on Pap smear, ASCUS   . Acute renal insufficiency   . Amputation finger    index  . Barrett's esophagus   . CAD (coronary artery disease)   . Dementia ([place])   . Diverticulosis   . Elevated BP   . Foot deformity   . GERD (gastroesophageal reflux disease)   . Glaucoma   . H/O: GI bleed   . HSV-2 (herpes simplex virus 2) infection   . Hyperglycemia   . Hypertension   . Migraine   . Osteoporosis    worsening  . Spondylosis     Patient Active Problem List   Diagnosis Date Noted  . Degenerative disc disease, lumbar 01/01/2018  . Hiatal hernia 11/04/2016  . Acute blood loss anemia   . Gastroesophageal reflux disease without esophagitis 07/31/2014  . Gastric ulcer without hemorrhage or perforation 07/22/2013  . History of syphilis 02/27/2012  . Dementia ([place])   . Spondylosis   . Foot deformity   . Migraine   . CAD (coronary artery disease)   . HSV-2 (herpes simplex virus 2) infection   . Abnormal finding on Pap smear, ASCUS   . Osteoporosis   . CONSTIPATION 12/12/2010  . ANEMIA, IRON DEFICIENCY 06/17/2008  . BARRETTS ESOPHAGUS 06/17/2008  . GLAUCOMA 06/16/2008  . Essential hypertension 06/16/2008  . DIVERTICULOSIS, COLON 06/16/2008  . ARTHRITIS 06/16/2008    . SLEEP APNEA 06/16/2008    Past Surgical History:  Procedure Laterality Date  . AMPUTATION FINGER / THUMB     secondary to osteomyelitis  . CATARACT EXTRACTION, BILATERAL Bilateral 12/25/2016  . COLONOSCOPY    . ESOPHAGOGASTRODUODENOSCOPY N/A 10/22/2016   Procedure: ESOPHAGOGASTRODUODENOSCOPY (EGD);  Surgeon: Doran Stabler, MD;  Location: [facility] ENDOSCOPY;  Service: Endoscopy;  Laterality: N/A;  . FOOT SURGERY     right  . PARTIAL HYSTERECTOMY    . UPPER GASTROINTESTINAL ENDOSCOPY      OB History   No obstetric history on file.      Home Medications    Prior to Admission medications   Medication Sig Start Date End Date Taking? Authorizing Provider  gabapentin (NEURONTIN) 800 MG tablet TAKE 1 TABLET(800 MG) BY MOUTH AT BEDTIME 09/30/20  Yes Wendie Agreste, MD  hydrochlorothiazide (MICROZIDE) 12.5 MG capsule TAKE 1 CAPSULE BY MOUTH EVERY Monday Wednesday AND Friday 07/05/20  Yes Maximiano Coss, NP  metoprolol succinate (TOPROL-XL) 50 MG 24 hr tablet TAKE 1 TABLET BY MOUTH EVERY DAY WITH OR IMMEDIATELY FOLLOWING A MEAL 03/03/20  Yes Stallings, Zoe A, MD  pantoprazole (PROTONIX) 40 MG tablet TAKE 1 TABLET(40 MG) BY MOUTH TWICE DAILY 10/05/20  Yes Armbruster, Carlota Raspberry, MD  verapamil (CALAN-SR) 240 MG CR tablet Take 1 tablet (240 mg total) by mouth daily.  11/03/19  Yes Forrest Moron, MD  Calcium Carbonate-Vitamin D (CALCIUM-VITAMIN D) 500-200 MG-UNIT tablet Take 3 tablets by mouth every morning.    [provider]  calcium-vitamin D (OSCAL WITH D) 500-200 MG-UNIT TABS tablet calcium carbonate 500 mg (1,250 mg)-vitamin D3 200 unit tablet   3 tbls by oral route.    [provider]  diclofenac Sodium (VOLTAREN) 1 % GEL Voltaren 1 % topical gel    [provider]  Elastic Bandages & Supports (LUMBAR BACK BRACE/SUPPORT PAD) MISC Dispense one back brace to be worn while doing activities as needed daily 11/03/19   Delia Chimes A, MD  ESTRACE VAGINAL 0.1 MG/GM  vaginal cream INSERT 1 APPLICATORFUL VAGINALLY ONCE A WEEK 07/15/19   Delia Chimes A, MD  FEROSUL 325 (65 Fe) MG tablet TAKE 1 TABLET(325 MG) BY MOUTH DAILY 01/12/20   Forrest Moron, MD  FEROSUL 325 (65 Fe) MG tablet TK 1 T PO QD 04/28/20   Delia Chimes A, MD  hydrocortisone 2.5 % ointment APPLY EXTERNALLY TO THE AFFECTED AREA TWICE DAILY 05/31/18   Wardell Honour, MD  hydroxypropyl methylcellulose (ISOPTO TEARS) 2.5 % ophthalmic solution Place 1 drop into both eyes 2 (two) times daily as needed for dry eyes.     [provider]  lactose free nutrition (BOOST) LIQD Take 237 mLs by mouth 2 (two) times daily between meals. Patient taking differently: Take 1 Container by mouth 2 (two) times daily as needed (nutritional).  03/22/16   [name], MD  latanoprost (XALATAN) 0.005 % ophthalmic solution INT 1 GTT IN OD QD 08/26/19   [provider]  lidocaine (LIDODERM) 5 % Place 1 patch onto the skin daily. Remove & Discard patch within 12 hours or as directed by MD.  Apply to the right side of your neck where it hurts 05/25/18   Blanchie Dessert, MD  NON FORMULARY     [provider]  nystatin cream (MYCOSTATIN) Apply to external affected area twice a day for 10 days as needed. 11/08/19   Forrest Moron, MD  ofloxacin (OCUFLOX) 0.3 % ophthalmic solution 10 drops in right ear once daily for 5 days. 01/29/19   Ok Edwards, PA-C  PREMARIN vaginal cream  10/10/19   [provider]  promethazine (PHENERGAN) 12.5 MG tablet take 1 tablet by mouth every 6 hours if needed for nausea and vomiting 11/03/19   Delia Chimes A, MD  sucralfate (CARAFATE) 1 g tablet Take 1 tablet (1 g total) by mouth every 6 (six) hours as needed. Slowly dissolve tablet in 1 Tablespoon of distilled water for 15 minutes before taking (do not crush) 07/15/19   Armbruster, Carlota Raspberry, MD  tiZANidine (ZANAFLEX) 4 MG tablet Take 1 tablet (4 mg total) by mouth every 6 (six) hours as needed for muscle spasms.  08/29/20   Orvan July, NP  traMADol-acetaminophen (ULTRACET) 37.5-325 MG tablet TAKE 1 TABLET BY MOUTH EVERY 6 HOURS 10/15/20   Wendie Agreste, MD  triamcinolone cream (KENALOG) 0.1 % Apply topically 2 (two) times daily. 09/04/18   Forrest Moron, MD  vitamin C (ASCORBIC ACID) 500 MG tablet Take 500 mg by mouth daily.    [provider]  ZINC OXIDE, TOPICAL, 10 % CREA 1 application to affected area to prevent moisture and further irritation of skin. 01/29/19   Ok Edwards, PA-C    Family History Family History  Problem Relation Age of Onset  . Breast cancer Mother   .  Heart attack Mother   . Heart disease Father   . Alcohol abuse Son   . Colon cancer Neg Hx   . Stomach cancer Neg Hx   . Rectal cancer Neg Hx     Social History Social History   Tobacco Use  . Smoking status: Never Smoker  . Smokeless tobacco: Never Used  Vaping Use  . Vaping Use: Never used  Substance Use Topics  . Alcohol use: No    Alcohol/week: 0.0 standard drinks  . Drug use: No     Allergies   Patient has no known allergies.   Review of Systems Review of Systems  Constitutional: Negative for chills and fever.  HENT: Negative for ear pain and sore throat.   Eyes: Negative for pain and visual disturbance.  Respiratory: Negative for cough and shortness of breath.   Cardiovascular: Negative for chest pain and palpitations.  Gastrointestinal: Negative for abdominal pain and vomiting.  Genitourinary: Negative for dysuria and hematuria.  Musculoskeletal: Positive for arthralgias. Negative for back pain.  Skin: Positive for color change. Negative for rash and wound.  Neurological: Negative for seizures, syncope, weakness and numbness.  All other systems reviewed and are negative.    Physical Exam Triage Vital Signs ED Triage Vitals  Enc Vitals Group     BP 10/19/20 1453 (!) 130/57     Pulse Rate 10/19/20 1453 78     Resp 10/19/20 1453 18     Temp 10/19/20 1453 97.9 F (36.6 C)      Temp Source 10/19/20 1453 Oral     SpO2 10/19/20 1453 98 %     Weight --      Height --      Head Circumference --      Peak Flow --      Pain Score 10/19/20 1447 8     Pain Loc --      Pain Edu? --      Excl. in [place]? --    No data found.  Updated Vital Signs BP (!) 130/57 (BP Location: Right Arm)   Pulse 78   Temp 97.9 F (36.6 C) (Oral)   Resp 18   SpO2 98%   Visual Acuity Right Eye Distance:   Left Eye Distance:   Bilateral Distance:    Right Eye Near:   Left Eye Near:    Bilateral Near:     Physical Exam Vitals and nursing note reviewed.  Constitutional:      General: She is not in acute distress.    Appearance: She is well-developed.  HENT:     Head: Normocephalic and atraumatic.     Mouth/Throat:     Mouth: Mucous membranes are moist.  Eyes:     Conjunctiva/sclera: Conjunctivae normal.  Cardiovascular:     Rate and Rhythm: Normal rate and regular rhythm.     Heart sounds: No murmur heard.   Pulmonary:     Effort: Pulmonary effort is normal. No respiratory distress.     Breath sounds: Normal breath sounds.  Abdominal:     Palpations: Abdomen is soft.     Tenderness: There is no abdominal tenderness.  Musculoskeletal:        General: Tenderness present. No swelling, deformity or signs of injury. Normal range of motion.     Cervical back: Neck supple.       Feet:  Skin:    General: Skin is warm and dry.     Findings: Bruising present. No erythema, lesion or  rash.  Neurological:     Mental Status: She is alert. Mental status is at baseline.     Comments: In wheelchair.  Psychiatric:        Mood and Affect: Mood normal.        Behavior: Behavior normal.      UC Treatments / Results  Labs (all labs ordered are listed, but only abnormal results are displayed) Labs Reviewed - No data to display  EKG   Radiology DG Foot Complete Right  Result Date: 10/19/2020 CLINICAL DATA:  Pain following trauma EXAM: RIGHT FOOT COMPLETE - 3+ VIEW  COMPARISON:  April 10, 2016 FINDINGS: Frontal, oblique, and lateral views. There is no appreciable acute fracture or gross dislocation. There is again noted hyperextension of the second MTP joint with flexion of the proximal second PIP joint, similar to prior study, likely representing hammertoe type deformity. There is marked hallux valgus deformity at the first MTP joint, stable. Calcification in the first MTP joint is likely due to chondrocalcinosis. There is a degree of bunion formation medially in the first MTP joint region. There is spurring in the dorsal midfoot region. Other joint spaces appear normal. There is an inferior calcaneal spur. There are multiple foci of arterial vascular calcification. IMPRESSION: No fracture or dislocation is appreciable. There is stable marked hallux valgus deformity at the first MTP joint with chondrocalcinosis in the first MTP joint and bunion formation medially. Apparent hammertoe type defect involving the second MTP and PIP joints as described, also present previously. No new joint space narrowing. Spurring dorsal midfoot. Inferior calcaneal spur. Multiple foci of arterial vascular calcification. Electronically Signed   By: Lowella Grip III M.D.   On: 10/19/2020 15:30    Procedures Procedures (including critical care time)  Medications Ordered in UC Medications - No data to display  Initial Impression / Assessment and Plan / UC Course  I have reviewed the triage vital signs and the nursing notes.  Pertinent labs & imaging results that were available during my care of the patient were reviewed by me and considered in my medical decision making (see chart for details).   Right foot pain.  X-ray negative for acute abnormality.  Treating with Tylenol, rest, elevation, ice packs.  Instructed patient to follow-up with her PCP or an orthopedist if her symptoms are not improving.  Patient agrees to plan of care.   Final Clinical Impressions(s) / UC Diagnoses     Final diagnoses:  Right foot pain     Discharge Instructions     Your xray does not show any broken bones.    Take Tylenol as needed for pain.  Rest and elevate your foot.  Apply ice packs 2-3 times a day for up to 15 minutes each.    Follow up with your primary care provider or an orthopedist if you symptoms continue or worsen.         ED Prescriptions    None     PDMP not reviewed this encounter.   Sharion Balloon, NP 10/19/20 [phone]

## 2020-10-25 ENCOUNTER — Telehealth: Payer: Self-pay | Admitting: Family Medicine

## 2020-10-25 NOTE — Telephone Encounter (Signed)
Received a fax after hours stating that pt woke up and had red bruise of side of hip. Pt states it hurts when she pushes on it. Please advise.

## 2020-10-26 ENCOUNTER — Telehealth: Payer: Self-pay | Admitting: Family Medicine

## 2020-10-26 NOTE — Telephone Encounter (Signed)
Called pt and scheduled virtual appt with provider for 10/28/20

## 2020-10-26 NOTE — Telephone Encounter (Signed)
Pls schedule pt for virtual to speak to Carlota Raspberry for home health services

## 2020-10-26 NOTE — Telephone Encounter (Signed)
Pt called and stated she would like provider to tell her if she should get her buster shot. Please advise.

## 2020-10-26 NOTE — Telephone Encounter (Signed)
Please schedule visit with anyone to eval fall and hip pain - if possible video visit if she is unable to come in so we can discuss home health - will need eval to place order.

## 2020-10-26 NOTE — Telephone Encounter (Signed)
Spoke with pt, says that she fell recently and has bruise on the right side that hurts when she touches. Pt is not able to come in for eval. Had aid to come to the home for cooking and cleaning but no one to aid in medical care. Pt lives alone. Home health sounds like a good option for her. Per pt, she falls a lot. Would you like for me to place referral

## 2020-10-27 NOTE — Telephone Encounter (Signed)
Noted  

## 2020-10-28 ENCOUNTER — Encounter: Payer: Self-pay | Admitting: Family Medicine

## 2020-10-28 ENCOUNTER — Other Ambulatory Visit: Payer: Self-pay

## 2020-10-28 ENCOUNTER — Encounter: Payer: Medicare Other | Admitting: Family Medicine

## 2020-10-28 NOTE — Patient Instructions (Signed)
° ° ° °  If you have lab work done today you will be contacted with your lab results within the next 2 weeks.  If you have not heard from us then please contact us. The fastest way to get your results is to register for My Chart. ° ° °IF you received an x-ray today, you will receive an invoice from [facility]. Please contact  Radiology at [phone] with questions or concerns regarding your invoice.  ° °IF you received labwork today, you will receive an invoice from [facility]. Please contact [facility] at [phone] with questions or concerns regarding your invoice.  ° °Our billing staff will not be able to assist you with questions regarding bills from these companies. ° °You will be contacted with the lab results as soon as they are available. The fastest way to get your results is to activate your My Chart account. Instructions are located on the last page of this paperwork. If you have not heard from us regarding the results in 2 weeks, please contact this office. °  ° ° ° °

## 2020-10-29 ENCOUNTER — Other Ambulatory Visit: Payer: Self-pay | Admitting: Family Medicine

## 2020-10-29 ENCOUNTER — Ambulatory Visit (INDEPENDENT_AMBULATORY_CARE_PROVIDER_SITE_OTHER): Payer: Medicare Other | Admitting: Family Medicine

## 2020-10-29 ENCOUNTER — Ambulatory Visit
Admission: RE | Admit: 2020-10-29 | Discharge: 2020-10-29 | Disposition: A | Discharge status: Home / Self Care | Payer: Medicare Other | Source: Ambulatory Visit | Attending: Family Medicine | Admitting: Family Medicine

## 2020-10-29 ENCOUNTER — Other Ambulatory Visit: Payer: Self-pay

## 2020-10-29 ENCOUNTER — Telehealth: Payer: Self-pay | Admitting: Family Medicine

## 2020-10-29 ENCOUNTER — Encounter: Payer: Self-pay | Admitting: Family Medicine

## 2020-10-29 VITALS — BP 152/88 | HR 77 | Temp 98.5°F | Ht <= 58 in | Wt 116.2 lb

## 2020-10-29 DIAGNOSIS — M79605 Pain in left leg: Secondary | ICD-10-CM | POA: Diagnosis not present

## 2020-10-29 DIAGNOSIS — S7012XA Contusion of left thigh, initial encounter: Secondary | ICD-10-CM

## 2020-10-29 DIAGNOSIS — M79671 Pain in right foot: Secondary | ICD-10-CM

## 2020-10-29 DIAGNOSIS — M25552 Pain in left hip: Secondary | ICD-10-CM

## 2020-10-29 DIAGNOSIS — Z9181 History of falling: Secondary | ICD-10-CM

## 2020-10-29 DIAGNOSIS — M19071 Primary osteoarthritis, right ankle and foot: Secondary | ICD-10-CM | POA: Diagnosis not present

## 2020-10-29 DIAGNOSIS — M5136 Other intervertebral disc degeneration, lumbar region: Secondary | ICD-10-CM | POA: Diagnosis not present

## 2020-10-29 DIAGNOSIS — M25551 Pain in right hip: Secondary | ICD-10-CM

## 2020-10-29 DIAGNOSIS — M160 Bilateral primary osteoarthritis of hip: Secondary | ICD-10-CM | POA: Diagnosis not present

## 2020-10-29 DIAGNOSIS — M47816 Spondylosis without myelopathy or radiculopathy, lumbar region: Secondary | ICD-10-CM | POA: Diagnosis not present

## 2020-10-29 NOTE — Patient Instructions (Addendum)
I will order x-rays of your hips, thigh and foot.  I have also ordered home health physical therapy.  Make sure to use the cane or walker if possible at home to help prevent falls but see more information below.  Recheck with me in the next 2 weeks.  Follow-up sooner if any new or worsening symptoms.  Fall Prevention in the Home, Adult Falls can cause injuries and can affect people from all age groups. There are many simple things that you can do to make your home safe and to help prevent falls. Ask for help when making these changes, if needed. What actions can I take to prevent falls? General instructions  Use good lighting in all rooms. Replace any light bulbs that burn out.  Turn on lights if it is dark. Use night-lights.  Place frequently used items in easy-to-reach places. Lower the shelves around your home if necessary.  Set up furniture so that there are clear paths around it. Avoid moving your furniture around.  Remove throw rugs and other tripping hazards from the floor.  Avoid walking on wet floors.  Fix any uneven floor surfaces.  Add color or contrast paint or tape to grab bars and handrails in your home. Place contrasting color strips on the first and last steps of stairways.  When you use a stepladder, make sure that it is completely opened and that the sides are firmly locked. Have someone hold the ladder while you are using it. Do not climb a closed stepladder.  Be aware of any and all pets. What can I do in the bathroom?      Keep the floor dry. Immediately clean up any water that spills onto the floor.  Remove soap buildup in the tub or shower on a regular basis.  Use non-skid mats or decals on the floor of the tub or shower.  Attach bath mats securely with double-sided, non-slip rug tape.  If you need to sit down while you are in the shower, use a plastic, non-slip stool.  Install grab bars by the toilet and in the tub and shower. Do not use towel bars  as grab bars. What can I do in the bedroom?  Make sure that a bedside light is easy to reach.  Do not use oversized bedding that drapes onto the floor.  Have a firm chair that has side arms to use for getting dressed. What can I do in the kitchen?  Clean up any spills right away.  If you need to reach for something above you, use a sturdy step stool that has a grab bar.  Keep electrical cables out of the way.  Do not use floor polish or wax that makes floors slippery. If you must use wax, make sure that it is non-skid floor wax. What can I do in the stairways?  Do not leave any items on the stairs.  Make sure that you have a light switch at the top of the stairs and the bottom of the stairs. Have them installed if you do not have them.  Make sure that there are handrails on both sides of the stairs. Fix handrails that are broken or loose. Make sure that handrails are as long as the stairways.  Install non-slip stair treads on all stairs in your home.  Avoid having throw rugs at the top or bottom of stairways, or secure the rugs with carpet tape to prevent them from moving.  Choose a Loss adjuster, chartered that does not  hide the edge of steps on the stairway.  Check any carpeting to make sure that it is firmly attached to the stairs. Fix any carpet that is loose or worn. What can I do on the outside of my home?  Use bright outdoor lighting.  Regularly repair the edges of walkways and driveways and fix any cracks.  Remove high doorway thresholds.  Trim any shrubbery on the main path into your home.  Regularly check that handrails are securely fastened and in good repair. Both sides of any steps should have handrails.  Install guardrails along the edges of any raised decks or porches.  Clear walkways of debris and clutter, including tools and rocks.  Have leaves, snow, and ice cleared regularly.  Use sand or salt on walkways during winter months.  In the garage, clean up any  spills right away, including grease or oil spills. What other actions can I take?  Wear closed-toe shoes that fit well and support your feet. Wear shoes that have rubber soles or low heels.  Use mobility aids as needed, such as canes, walkers, scooters, and crutches.  Review your medicines with your health care provider. Some medicines can cause dizziness or changes in blood pressure, which increase your risk of falling. Talk with your health care provider about other ways that you can decrease your risk of falls. This may include working with a physical therapist or trainer to improve your strength, balance, and endurance. Where to find more information  Centers for Disease Control and Prevention, [employer]: WebmailGuide.co.za  [employer] on Aging: BrainJudge.co.uk Contact a health care provider if:  You are afraid of falling at home.  You feel weak, drowsy, or dizzy at home.  You fall at home. Summary  There are many simple things that you can do to make your home safe and to help prevent falls.  Ways to make your home safe include removing tripping hazards and installing grab bars in the bathroom.  Ask for help when making these changes in your home. This information is not intended to replace advice given to you by your health care provider. Make sure you discuss any questions you have with your health care provider. Document Revised: 11/23/2017 Document Reviewed: 07/26/2017 Elsevier Patient Education  [employer].   If you have lab work done today you will be contacted with your lab results within the next 2 weeks.  If you have not heard from [place] then please contact us. The fastest way to get your results is to register for My Chart.   IF you received an x-ray today, you will receive an invoice from [facility]. Please contact [facility] at [phone] with questions or concerns regarding your invoice.   IF you received labwork  today, you will receive an invoice from [place] [facility]. Please contact [facility] at [phone] with questions or concerns regarding your invoice.   Our billing staff will not be able to assist you with questions regarding bills from these companies.  You will be contacted with the lab results as soon as they are available. The fastest way to get your results is to activate your My Chart account. Instructions are located on the last page of this paperwork. If you have not heard from [place] regarding the results in 2 weeks, please contact this office.

## 2020-10-29 NOTE — Telephone Encounter (Signed)
Called patient got her VM / transportation was having a little trouble getting a hold of her . Hopefully she is on her way to appt

## 2020-10-29 NOTE — Progress Notes (Addendum)
Subjective:  Patient ID: [place] Kirchner, female    DOB: [birth year]  Age: 86 y.o. MRN: [id number]  CC:  Chief Complaint  Patient presents with  . home health referral    can she get a booster   . right 4th toe pain    HPI Kinzey Sheriff presents for   Home health evaluation/referral: See recent phone note.  Has reported some falls at home.  Recent fall and had a rib bruise on the side of her hip with pressure on the area only. She does have a aide to help with cooking and cleaning but no medical care. Fall about a week ago. Walking from bathroom - using cane, no CP/nearsyncope. No syncope. Unknown cause - just lost balance. Fell onto left side - slid to floor, pain in both hips and bruising on left side.  Has been able to weight bear since injury. Some difficulty with history, but does not hurt to put weigh on hips or foot since fall.  Trouble using walker d/t space in home.  Tramadol once per day for back issues.  Denies lightheadedness/dizziness.  Difficult historian.   Right fourth toe pain: Past few weeks, noticed out of the blue. NKI.  Tx: tylenol - some relief.     History Patient Active Problem List   Diagnosis Date Noted  . Degenerative disc disease, lumbar 01/01/2018  . Hiatal hernia 11/04/2016  . Acute blood loss anemia   . Gastroesophageal reflux disease without esophagitis 07/31/2014  . Gastric ulcer without hemorrhage or perforation 07/22/2013  . History of syphilis 02/27/2012  . Dementia ([place])   . Spondylosis   . Foot deformity   . Migraine   . CAD (coronary artery disease)   . HSV-2 (herpes simplex virus 2) infection   . Abnormal finding on Pap smear, ASCUS   . Osteoporosis   . CONSTIPATION 12/12/2010  . ANEMIA, IRON DEFICIENCY 06/17/2008  . BARRETTS ESOPHAGUS 06/17/2008  . GLAUCOMA 06/16/2008  . Essential hypertension 06/16/2008  . DIVERTICULOSIS, COLON 06/16/2008  . ARTHRITIS 06/16/2008  . SLEEP APNEA 06/16/2008   Past Medical History:  Diagnosis  Date  . Abnormal finding on Pap smear, ASCUS   . Acute renal insufficiency   . Amputation finger    index  . Barrett's esophagus   . CAD (coronary artery disease)   . Dementia ([place])   . Diverticulosis   . Elevated BP   . Foot deformity   . GERD (gastroesophageal reflux disease)   . Glaucoma   . H/O: GI bleed   . HSV-2 (herpes simplex virus 2) infection   . Hyperglycemia   . Hypertension   . Migraine   . Osteoporosis    worsening  . Spondylosis    Past Surgical History:  Procedure Laterality Date  . AMPUTATION FINGER / THUMB     secondary to osteomyelitis  . CATARACT EXTRACTION, BILATERAL Bilateral 12/25/2016  . COLONOSCOPY    . ESOPHAGOGASTRODUODENOSCOPY N/A 10/22/2016   Procedure: ESOPHAGOGASTRODUODENOSCOPY (EGD);  Surgeon: Doran Stabler, MD;  Location: [facility] ENDOSCOPY;  Service: Endoscopy;  Laterality: N/A;  . FOOT SURGERY     right  . PARTIAL HYSTERECTOMY    . UPPER GASTROINTESTINAL ENDOSCOPY     No Known Allergies Prior to Admission medications   Medication Sig Start Date End Date Taking? Authorizing Provider  Calcium Carbonate-Vitamin D (CALCIUM-VITAMIN D) 500-200 MG-UNIT tablet Take 3 tablets by mouth every morning.   Yes [provider]  calcium-vitamin D (OSCAL WITH D) 500-200 MG-UNIT  TABS tablet calcium carbonate 500 mg (1,250 mg)-vitamin D3 200 unit tablet   3 tbls by oral route.   Yes [provider]  diclofenac Sodium (VOLTAREN) 1 % GEL Voltaren 1 % topical gel   Yes [provider]  Elastic Bandages & Supports (LUMBAR BACK BRACE/SUPPORT PAD) MISC Dispense one back brace to be worn while doing activities as needed daily 11/03/19  Yes Stallings, Zoe A, MD  ESTRACE VAGINAL 0.1 MG/GM vaginal cream INSERT 1 APPLICATORFUL VAGINALLY ONCE A WEEK 07/15/19  Yes Stallings, Zoe A, MD  FEROSUL 325 (65 Fe) MG tablet TAKE 1 TABLET(325 MG) BY MOUTH DAILY 01/12/20  Yes Delia Chimes A, MD  gabapentin (NEURONTIN) 800 MG tablet TAKE 1 TABLET(800 MG) BY  MOUTH AT BEDTIME 09/30/20  Yes Wendie Agreste, MD  hydrochlorothiazide (MICROZIDE) 12.5 MG capsule TAKE 1 CAPSULE BY MOUTH EVERY Monday Wednesday AND Friday 07/05/20  Yes Maximiano Coss, NP  hydrocortisone 2.5 % ointment APPLY EXTERNALLY TO THE AFFECTED AREA TWICE DAILY 05/31/18  Yes Wardell Honour, MD  hydroxypropyl methylcellulose (ISOPTO TEARS) 2.5 % ophthalmic solution Place 1 drop into both eyes 2 (two) times daily as needed for dry eyes.    Yes [provider]  lactose free nutrition (BOOST) LIQD Take 237 mLs by mouth 2 (two) times daily between meals. Patient taking differently: Take 1 Container by mouth 2 (two) times daily as needed (nutritional).  03/22/16  Yes [name], MD  latanoprost (XALATAN) 0.005 % ophthalmic solution INT 1 GTT IN OD QD 08/26/19  Yes [provider]  lidocaine (LIDODERM) 5 % Place 1 patch onto the skin daily. Remove & Discard patch within 12 hours or as directed by MD.  Apply to the right side of your neck where it hurts 05/25/18  Yes Plunkett, Loree Fee, MD  metoprolol succinate (TOPROL-XL) 50 MG 24 hr tablet TAKE 1 TABLET BY MOUTH EVERY DAY WITH OR IMMEDIATELY FOLLOWING A MEAL 03/03/20  Yes Forrest Moron, MD  NON FORMULARY    Yes [provider]  nystatin cream (MYCOSTATIN) Apply to external affected area twice a day for 10 days as needed. 11/08/19  Yes Stallings, Zoe A, MD  ofloxacin (OCUFLOX) 0.3 % ophthalmic solution 10 drops in right ear once daily for 5 days. 01/29/19  Yes Yu, Amy V, PA-C  pantoprazole (PROTONIX) 40 MG tablet TAKE 1 TABLET(40 MG) BY MOUTH TWICE DAILY 10/05/20  Yes Armbruster, Carlota Raspberry, MD  PREMARIN vaginal cream  10/10/19  Yes [provider]  promethazine (PHENERGAN) 12.5 MG tablet take 1 tablet by mouth every 6 hours if needed for nausea and vomiting 11/03/19  Yes Stallings, Zoe A, MD  sucralfate (CARAFATE) 1 g tablet Take 1 tablet (1 g total) by mouth every 6 (six) hours as needed. Slowly dissolve tablet in 1  Tablespoon of distilled water for 15 minutes before taking (do not crush) 07/15/19  Yes Armbruster, Carlota Raspberry, MD  tiZANidine (ZANAFLEX) 4 MG tablet Take 1 tablet (4 mg total) by mouth every 6 (six) hours as needed for muscle spasms. 08/29/20  Yes Bast, Traci A, NP  traMADol-acetaminophen (ULTRACET) 37.5-325 MG tablet TAKE 1 TABLET BY MOUTH EVERY 6 HOURS 10/15/20  Yes Wendie Agreste, MD  triamcinolone cream (KENALOG) 0.1 % Apply topically 2 (two) times daily. 09/04/18  Yes Stallings, Zoe A, MD  verapamil (CALAN-SR) 240 MG CR tablet Take 1 tablet (240 mg total) by mouth daily. 11/03/19  Yes Forrest Moron, MD  vitamin C (ASCORBIC ACID)  500 MG tablet Take 500 mg by mouth daily.   Yes [provider]  ZINC OXIDE, TOPICAL, 10 % CREA 1 application to affected area to prevent moisture and further irritation of skin. 01/29/19  Yes Ok Edwards, PA-C   Social History   Socioeconomic History  . Marital status: Single    Spouse name: Not on file  . Number of children: 1  . Years of education: Not on file  . Highest education level: 12th grade  Occupational History    Employer: RETIRED  Tobacco Use  . Smoking status: Never Smoker  . Smokeless tobacco: Never Used  Vaping Use  . Vaping Use: Never used  Substance and Sexual Activity  . Alcohol use: No    Alcohol/week: 0.0 standard drinks  . Drug use: No  . Sexual activity: Never    Birth control/protection: Post-menopausal  Other Topics Concern  . Not on file  Social History Narrative   Marital status: divorced.      Children: 1 son; no grandchildren      Lives: with son      Employment: retired      Tobacco: none      Alcohol: none      Drugs: none       Exercise: none       ADLs:  Independent with ADLs.  Quit driving age 70.  Medical CNA cleans house; makes breakfast; patient pays bills.      Advanced Directives:   YES.  HCPOA:  Son/Isaac Bowman.  FULL CODE; no prolonged measures.   Social Determinants of Health   Financial  Resource Strain:   . Difficulty of Paying Living Expenses: Not on file  Food Insecurity:   . Worried About Charity fundraiser in the Last Year: Not on file  . Ran Out of Food in the Last Year: Not on file  Transportation Needs:   . Lack of Transportation (Medical): Not on file  . Lack of Transportation (Non-Medical): Not on file  Physical Activity:   . Days of Exercise per Week: Not on file  . Minutes of Exercise per Session: Not on file  Stress:   . Feeling of Stress : Not on file  Social Connections:   . Frequency of Communication with Friends and Family: Not on file  . Frequency of Social Gatherings with Friends and Family: Not on file  . Attends Religious Services: Not on file  . Active Member of Clubs or Organizations: Not on file  . Attends Archivist Meetings: Not on file  . Marital Status: Not on file  Intimate Partner Violence:   . Fear of Current or Ex-Partner: Not on file  . Emotionally Abused: Not on file  . Physically Abused: Not on file  . Sexually Abused: Not on file    Review of Systems   Objective:   Vitals:   10/29/20 1426 10/29/20 1429  BP: (!) 166/76 (!) 152/88  Pulse: 77   Temp: 98.5 F (36.9 C)   TempSrc: Temporal   SpO2: 96%   Weight: 116 lb 3.2 oz (52.7 kg)   Height: 4\' 10"  (1.473 m)      Physical Exam Constitutional:      General: She is not in acute distress.    Appearance: She is well-developed.  HENT:     Head: Normocephalic and atraumatic.  Cardiovascular:     Rate and Rhythm: Normal rate.  Pulmonary:     Effort: Pulmonary effort is normal.  Musculoskeletal:     Comments: R foot -  Rotation of 2nd ray. ttp 4th toe to distal metatarsals diffusely. Slight darkening of 4th toe. Cap refill 1s. See photo.   Right hip, minimal tenderness over trochanteric bursa, pain-free internal/external rotation.  Left hip pain-free internal and external rotation.  Tender to palpation over lateral thigh with faint resolving ecchymosis.    Able to weight-bear on both legs without appreciable discomfort.   Neurological:     Mental Status: She is alert and oriented to person, place, and time.       DG Foot Complete Right  Result Date: 10/29/2020 CLINICAL DATA:  Foot pain following fall 1 week ago, subsequent encounter EXAM: RIGHT FOOT COMPLETE - 3+ VIEW COMPARISON:  10/19/2020 FINDINGS: Hallux valgus deformity is noted. Hammertoe deformity in the second toe is again seen and stable. Calcaneal spurring is noted as well as tarsal degenerative change. No soft tissue abnormality is seen. No fracture is noted. IMPRESSION: Chronic changes similar to that seen on the prior exam. No acute abnormality noted. Electronically Signed   By: Inez Catalina M.D.   On: 10/29/2020 17:13   DG FEMUR MIN 2 VIEWS LEFT  Result Date: 10/29/2020 CLINICAL DATA:  Recent fall 1 week ago with left leg pain, initial encounter EXAM: LEFT FEMUR 2 VIEWS COMPARISON:  None. FINDINGS: There is no evidence of fracture or other focal bone lesions. Soft tissues are unremarkable. IMPRESSION: No acute abnormality noted. Electronically Signed   By: Inez Catalina M.D.   On: 10/29/2020 17:15   DG HIPS BILAT WITH PELVIS MIN 5 VIEWS  Result Date: 10/29/2020 CLINICAL DATA:  Fall 1 week ago with hip pain, initial encounter EXAM: DG HIP (WITH OR WITHOUT PELVIS) 5+V BILAT COMPARISON:  None. FINDINGS: Pelvic ring as visualized is intact. Degenerative changes of the hip joints are noted bilaterally. No acute fracture or dislocation is seen. Degenerative changes of lumbar spine are seen. IMPRESSION: Degenerative change without acute abnormality. Electronically Signed   By: Inez Catalina M.D.   On: 10/29/2020 17:14     Assessment & Plan:  Lory Nowaczyk is a 86 y.o. female . History of fall - Plan: Ambulatory referral to [facility], DG Foot Complete Right, DG FEMUR MIN 2 VIEWS LEFT, CANCELED: DG HIPS BILAT W OR W/O PELVIS 2V  Right foot pain - Plan: DG Foot Complete  Right  Contusion of left thigh, initial encounter - Plan: DG FEMUR MIN 2 VIEWS LEFT  Bilateral hip pain - Plan: CANCELED: DG HIPS BILAT W OR W/O PELVIS 2V  History of fall at home, denies any near syncopal symptoms or cardiac symptoms.  Suspect mechanical fall.  X-rays reassuring, and does not have pain with weightbearing, unlikely hip fracture.  Suspected lateral thigh contusion and contusion versus strain of foot.  Symptomatic care discussed, recheck in 2 weeks, RTC precautions if any worsening sooner. Discussed on phone with pt and understanding expressed.   In regards to fall, handout was given on fall prevention at home.  She denies any new side effects with tramadol, but did discuss potential side effects with that medication.  Home health evaluation was ordered for physical therapy evaluation, home safety and assistive device evaluation.  Potentially may need strengthening program as well.  Degenerative disc disease of the lumbar spine with chronic back pain, has had to walk slowly in past to help prevent falls, along with need to use assistive device. chronic severe lumbar degeneration probably contributing to her unsteadiness and fall at  home.  Depending on PT eval and progression consider Ortho/spine specialist follow-up.  No orders of the defined types were placed in this encounter.  Patient Instructions    I will order x-rays of your hips, thigh and foot.  I have also ordered home health physical therapy.  Make sure to use the cane or walker if possible at home to help prevent falls but see more information below.  Recheck with me in the next 2 weeks.  Follow-up sooner if any new or worsening symptoms.  Fall Prevention in the Home, Adult Falls can cause injuries and can affect people from all age groups. There are many simple things that you can do to make your home safe and to help prevent falls. Ask for help when making these changes, if needed. What actions can I take to prevent  falls? General instructions  Use good lighting in all rooms. Replace any light bulbs that burn out.  Turn on lights if it is dark. Use night-lights.  Place frequently used items in easy-to-reach places. Lower the shelves around your home if necessary.  Set up furniture so that there are clear paths around it. Avoid moving your furniture around.  Remove throw rugs and other tripping hazards from the floor.  Avoid walking on wet floors.  Fix any uneven floor surfaces.  Add color or contrast paint or tape to grab bars and handrails in your home. Place contrasting color strips on the first and last steps of stairways.  When you use a stepladder, make sure that it is completely opened and that the sides are firmly locked. Have someone hold the ladder while you are using it. Do not climb a closed stepladder.  Be aware of any and all pets. What can I do in the bathroom?      Keep the floor dry. Immediately clean up any water that spills onto the floor.  Remove soap buildup in the tub or shower on a regular basis.  Use non-skid mats or decals on the floor of the tub or shower.  Attach bath mats securely with double-sided, non-slip rug tape.  If you need to sit down while you are in the shower, use a plastic, non-slip stool.  Install grab bars by the toilet and in the tub and shower. Do not use towel bars as grab bars. What can I do in the bedroom?  Make sure that a bedside light is easy to reach.  Do not use oversized bedding that drapes onto the floor.  Have a firm chair that has side arms to use for getting dressed. What can I do in the kitchen?  Clean up any spills right away.  If you need to reach for something above you, use a sturdy step stool that has a grab bar.  Keep electrical cables out of the way.  Do not use floor polish or wax that makes floors slippery. If you must use wax, make sure that it is non-skid floor wax. What can I do in the stairways?  Do not  leave any items on the stairs.  Make sure that you have a light switch at the top of the stairs and the bottom of the stairs. Have them installed if you do not have them.  Make sure that there are handrails on both sides of the stairs. Fix handrails that are broken or loose. Make sure that handrails are as long as the stairways.  Install non-slip stair treads on all stairs in your home.  Avoid having throw rugs at the top or bottom of stairways, or secure the rugs with carpet tape to prevent them from moving.  Choose a carpet design that does not hide the edge of steps on the stairway.  Check any carpeting to make sure that it is firmly attached to the stairs. Fix any carpet that is loose or worn. What can I do on the outside of my home?  Use bright outdoor lighting.  Regularly repair the edges of walkways and driveways and fix any cracks.  Remove high doorway thresholds.  Trim any shrubbery on the main path into your home.  Regularly check that handrails are securely fastened and in good repair. Both sides of any steps should have handrails.  Install guardrails along the edges of any raised decks or porches.  Clear walkways of debris and clutter, including tools and rocks.  Have leaves, snow, and ice cleared regularly.  Use sand or salt on walkways during winter months.  In the garage, clean up any spills right away, including grease or oil spills. What other actions can I take?  Wear closed-toe shoes that fit well and support your feet. Wear shoes that have rubber soles or low heels.  Use mobility aids as needed, such as canes, walkers, scooters, and crutches.  Review your medicines with your health care provider. Some medicines can cause dizziness or changes in blood pressure, which increase your risk of falling. Talk with your health care provider about other ways that you can decrease your risk of falls. This may include working with a physical therapist or trainer to  improve your strength, balance, and endurance. Where to find more information  Centers for Disease Control and Prevention, [employer]AD[employer]: WebmailGuide.co.za  [employer] on Aging: BrainJudge.co.uk Contact a health care provider if:  You are afraid of falling at home.  You feel weak, drowsy, or dizzy at home.  You fall at home. Summary  There are many simple things that you can do to make your home safe and to help prevent falls.  Ways to make your home safe include removing tripping hazards and installing grab bars in the bathroom.  Ask for help when making these changes in your home. This information is not intended to replace advice given to you by your health care provider. Make sure you discuss any questions you have with your health care provider. Document Revised: 11/23/2017 Document Reviewed: 07/26/2017 Elsevier Patient Education  [employer].   If you have lab work done today you will be contacted with your lab results within the next 2 weeks.  If you have not heard from [place] then please contact us. The fastest way to get your results is to register for My Chart.   IF you received an x-ray today, you will receive an invoice from [facility]. Please contact [facility] at [phone] with questions or concerns regarding your invoice.   IF you received labwork today, you will receive an invoice from [facility]. Please contact [facility] at [phone] with questions or concerns regarding your invoice.   Our billing staff will not be able to assist you with questions regarding bills from these companies.  You will be contacted with the lab results as soon as they are available. The fastest way to get your results is to activate your My Chart account. Instructions are located on the last page of this paperwork. If you have not heard from [place] regarding the results in 2 weeks, please contact this office.  Signed, Merri Ray,  MD [facility]

## 2020-10-29 NOTE — Telephone Encounter (Signed)
This will be addressed at office visit today.

## 2020-11-03 DIAGNOSIS — D692 Other nonthrombocytopenic purpura: Secondary | ICD-10-CM | POA: Diagnosis not present

## 2020-11-03 DIAGNOSIS — L821 Other seborrheic keratosis: Secondary | ICD-10-CM | POA: Diagnosis not present

## 2020-11-04 ENCOUNTER — Telehealth: Payer: Self-pay | Admitting: Family Medicine

## 2020-11-04 DIAGNOSIS — I10 Essential (primary) hypertension: Secondary | ICD-10-CM | POA: Diagnosis not present

## 2020-11-04 DIAGNOSIS — D62 Acute posthemorrhagic anemia: Secondary | ICD-10-CM | POA: Diagnosis not present

## 2020-11-04 DIAGNOSIS — Z79891 Long term (current) use of opiate analgesic: Secondary | ICD-10-CM | POA: Diagnosis not present

## 2020-11-04 DIAGNOSIS — R296 Repeated falls: Secondary | ICD-10-CM | POA: Diagnosis not present

## 2020-11-04 DIAGNOSIS — M2041 Other hammer toe(s) (acquired), right foot: Secondary | ICD-10-CM | POA: Diagnosis not present

## 2020-11-04 DIAGNOSIS — M5136 Other intervertebral disc degeneration, lumbar region: Secondary | ICD-10-CM | POA: Diagnosis not present

## 2020-11-04 DIAGNOSIS — G4730 Sleep apnea, unspecified: Secondary | ICD-10-CM | POA: Diagnosis not present

## 2020-11-04 DIAGNOSIS — K259 Gastric ulcer, unspecified as acute or chronic, without hemorrhage or perforation: Secondary | ICD-10-CM | POA: Diagnosis not present

## 2020-11-04 DIAGNOSIS — K5790 Diverticulosis of intestine, part unspecified, without perforation or abscess without bleeding: Secondary | ICD-10-CM | POA: Diagnosis not present

## 2020-11-04 DIAGNOSIS — Z9181 History of falling: Secondary | ICD-10-CM | POA: Diagnosis not present

## 2020-11-04 DIAGNOSIS — S7012XD Contusion of left thigh, subsequent encounter: Secondary | ICD-10-CM | POA: Diagnosis not present

## 2020-11-04 DIAGNOSIS — I2510 Atherosclerotic heart disease of native coronary artery without angina pectoris: Secondary | ICD-10-CM | POA: Diagnosis not present

## 2020-11-04 DIAGNOSIS — Z8619 Personal history of other infectious and parasitic diseases: Secondary | ICD-10-CM | POA: Diagnosis not present

## 2020-11-04 DIAGNOSIS — G43909 Migraine, unspecified, not intractable, without status migrainosus: Secondary | ICD-10-CM | POA: Diagnosis not present

## 2020-11-04 DIAGNOSIS — M216X1 Other acquired deformities of right foot: Secondary | ICD-10-CM | POA: Diagnosis not present

## 2020-11-04 DIAGNOSIS — F0390 Unspecified dementia without behavioral disturbance: Secondary | ICD-10-CM | POA: Diagnosis not present

## 2020-11-04 DIAGNOSIS — B009 Herpesviral infection, unspecified: Secondary | ICD-10-CM | POA: Diagnosis not present

## 2020-11-04 DIAGNOSIS — Z90710 Acquired absence of both cervix and uterus: Secondary | ICD-10-CM | POA: Diagnosis not present

## 2020-11-04 DIAGNOSIS — M810 Age-related osteoporosis without current pathological fracture: Secondary | ICD-10-CM | POA: Diagnosis not present

## 2020-11-04 DIAGNOSIS — K5900 Constipation, unspecified: Secondary | ICD-10-CM | POA: Diagnosis not present

## 2020-11-04 DIAGNOSIS — H409 Unspecified glaucoma: Secondary | ICD-10-CM | POA: Diagnosis not present

## 2020-11-04 DIAGNOSIS — M1990 Unspecified osteoarthritis, unspecified site: Secondary | ICD-10-CM | POA: Diagnosis not present

## 2020-11-04 DIAGNOSIS — M479 Spondylosis, unspecified: Secondary | ICD-10-CM | POA: Diagnosis not present

## 2020-11-04 DIAGNOSIS — K219 Gastro-esophageal reflux disease without esophagitis: Secondary | ICD-10-CM | POA: Diagnosis not present

## 2020-11-04 DIAGNOSIS — K449 Diaphragmatic hernia without obstruction or gangrene: Secondary | ICD-10-CM | POA: Diagnosis not present

## 2020-11-04 DIAGNOSIS — K2270 Barrett's esophagus without dysplasia: Secondary | ICD-10-CM | POA: Diagnosis not present

## 2020-11-04 NOTE — Telephone Encounter (Signed)
Home Health Verbal Orders - Caller/Agency: [facility] Number: [phone] Requesting OT/PT/Skilled Nursing/Social Work/Speech Therapy: PT for strengthening in an attempt to decrease her falls. Ambulation and balance  Frequency:  1w1 2w3 1w5

## 2020-11-04 NOTE — Telephone Encounter (Signed)
completed

## 2020-11-05 ENCOUNTER — Telehealth: Payer: Self-pay | Admitting: Family Medicine

## 2020-11-05 NOTE — Telephone Encounter (Signed)
Pt called while we where out to lunch / unable to LVM   Patient is wanting to cancel her upcoming appt for next Friday   Per CRM

## 2020-11-08 DIAGNOSIS — M5136 Other intervertebral disc degeneration, lumbar region: Secondary | ICD-10-CM | POA: Diagnosis not present

## 2020-11-08 DIAGNOSIS — M479 Spondylosis, unspecified: Secondary | ICD-10-CM | POA: Diagnosis not present

## 2020-11-08 DIAGNOSIS — M810 Age-related osteoporosis without current pathological fracture: Secondary | ICD-10-CM | POA: Diagnosis not present

## 2020-11-08 DIAGNOSIS — S7012XD Contusion of left thigh, subsequent encounter: Secondary | ICD-10-CM | POA: Diagnosis not present

## 2020-11-08 DIAGNOSIS — M1990 Unspecified osteoarthritis, unspecified site: Secondary | ICD-10-CM | POA: Diagnosis not present

## 2020-11-08 DIAGNOSIS — M2041 Other hammer toe(s) (acquired), right foot: Secondary | ICD-10-CM | POA: Diagnosis not present

## 2020-11-08 NOTE — Telephone Encounter (Signed)
Pt called back spoke to nurse he stated they recommend for pts to get and she understood. Please advise.

## 2020-11-08 NOTE — Telephone Encounter (Signed)
Dr. Carlota Raspberry: Patient wants to know what shots she has received. She also asked if she really needs to take the booster shot. Her appt for the booster shot is this Friday, 11/12/2020. Please advise  [phone].

## 2020-11-12 ENCOUNTER — Ambulatory Visit: Payer: Medicare Other | Admitting: Family Medicine

## 2020-11-12 DIAGNOSIS — Z1231 Encounter for screening mammogram for malignant neoplasm of breast: Secondary | ICD-10-CM | POA: Diagnosis not present

## 2020-11-12 DIAGNOSIS — Z124 Encounter for screening for malignant neoplasm of cervix: Secondary | ICD-10-CM | POA: Diagnosis not present

## 2020-11-12 DIAGNOSIS — B373 Candidiasis of vulva and vagina: Secondary | ICD-10-CM | POA: Diagnosis not present

## 2020-11-12 DIAGNOSIS — Z23 Encounter for immunization: Secondary | ICD-10-CM | POA: Diagnosis not present

## 2020-11-12 LAB — HM MAMMOGRAPHY

## 2020-11-15 DIAGNOSIS — M479 Spondylosis, unspecified: Secondary | ICD-10-CM | POA: Diagnosis not present

## 2020-11-15 DIAGNOSIS — M5136 Other intervertebral disc degeneration, lumbar region: Secondary | ICD-10-CM | POA: Diagnosis not present

## 2020-11-15 DIAGNOSIS — M1990 Unspecified osteoarthritis, unspecified site: Secondary | ICD-10-CM | POA: Diagnosis not present

## 2020-11-15 DIAGNOSIS — M810 Age-related osteoporosis without current pathological fracture: Secondary | ICD-10-CM | POA: Diagnosis not present

## 2020-11-15 DIAGNOSIS — M2041 Other hammer toe(s) (acquired), right foot: Secondary | ICD-10-CM | POA: Diagnosis not present

## 2020-11-15 DIAGNOSIS — S7012XD Contusion of left thigh, subsequent encounter: Secondary | ICD-10-CM | POA: Diagnosis not present

## 2020-11-23 DIAGNOSIS — M5136 Other intervertebral disc degeneration, lumbar region: Secondary | ICD-10-CM | POA: Diagnosis not present

## 2020-11-23 DIAGNOSIS — M2041 Other hammer toe(s) (acquired), right foot: Secondary | ICD-10-CM | POA: Diagnosis not present

## 2020-11-23 DIAGNOSIS — M810 Age-related osteoporosis without current pathological fracture: Secondary | ICD-10-CM | POA: Diagnosis not present

## 2020-11-23 DIAGNOSIS — M1990 Unspecified osteoarthritis, unspecified site: Secondary | ICD-10-CM | POA: Diagnosis not present

## 2020-11-23 DIAGNOSIS — M479 Spondylosis, unspecified: Secondary | ICD-10-CM | POA: Diagnosis not present

## 2020-11-23 DIAGNOSIS — S7012XD Contusion of left thigh, subsequent encounter: Secondary | ICD-10-CM | POA: Diagnosis not present

## 2020-11-25 ENCOUNTER — Ambulatory Visit (INDEPENDENT_AMBULATORY_CARE_PROVIDER_SITE_OTHER): Payer: Medicare Other | Admitting: Family Medicine

## 2020-11-25 ENCOUNTER — Other Ambulatory Visit: Payer: Self-pay

## 2020-11-25 ENCOUNTER — Encounter: Payer: Self-pay | Admitting: Family Medicine

## 2020-11-25 VITALS — BP 116/60 | HR 63 | Temp 98.6°F | Ht <= 58 in

## 2020-11-25 DIAGNOSIS — M79671 Pain in right foot: Secondary | ICD-10-CM

## 2020-11-25 NOTE — Patient Instructions (Addendum)
Try a lotion such as Aveeno daily dry skin on the feet.  You can try over-the-counter corn or callus pad for the top of your second toe so that does not rub on the shoe.  Some of the pain at your and your foot may be related to the movement of the toes, so I would like you to discuss that with podiatry.  They may want to fit you for a special insert of your shoe.  I will see if they can try to move up that visit if there are any cancellations.   Return to the clinic or go to the nearest emergency room if any of your symptoms worsen or new symptoms occur.  I am glad to hear physical therapy is going well, continue that to minimize chance of further falls.    If you have lab work done today you will be contacted with your lab results within the next 2 weeks.  If you have not heard from [place] then please contact us. The fastest way to get your results is to register for My Chart.   IF you received an x-ray today, you will receive an invoice from [facility]. Please contact [facility] at [phone] with questions or concerns regarding your invoice.   IF you received labwork today, you will receive an invoice from [facility]. Please contact [facility] at [phone] with questions or concerns regarding your invoice.   Our billing staff will not be able to assist you with questions regarding bills from these companies.  You will be contacted with the lab results as soon as they are available. The fastest way to get your results is to activate your My Chart account. Instructions are located on the last page of this paperwork. If you have not heard from [place] regarding the results in 2 weeks, please contact this office.

## 2020-11-25 NOTE — Progress Notes (Signed)
Subjective:  Patient ID: [name], female    DOB: 07-May-1933  Age: 86 y.o. MRN: [id number]  CC:  Chief Complaint  Patient presents with  . Foot Pain    PT reports having pain in her 2nd to last toe on her R foot. Pt also repots the top of her R foot and outer side of the R leg is tender. Skin onder the knuckle of the 2nd to last toe of the R foot is peeling.    HPI [name] presents for   R foot pain: She was evaluated November 5 after fall, right foot x-ray at that time showed chronic changes including hammertoe deformity in the second toe, calcaneal spurring as well as tarsal degenerative change. No new injury. Still sore - 4th toe, sore into foot with movement.  appt with podiatry on 12/17. Same pain in feet.   using cane or walker at home. Has PT working with her at home.  No further falls.  Feels like PT has been helping- 1-2 times per week.     History Patient Active Problem List   Diagnosis Date Noted  . Degenerative disc disease, lumbar 01/01/2018  . Hiatal hernia 11/04/2016  . Acute blood loss anemia   . Gastroesophageal reflux disease without esophagitis 07/31/2014  . Gastric ulcer without hemorrhage or perforation 07/22/2013  . History of syphilis 02/27/2012  . Dementia ([place])   . Spondylosis   . Foot deformity   . Migraine   . CAD (coronary artery disease)   . HSV-2 (herpes simplex virus 2) infection   . Abnormal finding on Pap smear, ASCUS   . Osteoporosis   . CONSTIPATION 12/12/2010  . ANEMIA, IRON DEFICIENCY 06/17/2008  . BARRETTS ESOPHAGUS 06/17/2008  . GLAUCOMA 06/16/2008  . Essential hypertension 06/16/2008  . DIVERTICULOSIS, COLON 06/16/2008  . ARTHRITIS 06/16/2008  . SLEEP APNEA 06/16/2008   Past Medical History:  Diagnosis Date  . Abnormal finding on Pap smear, ASCUS   . Acute renal insufficiency   . Amputation finger    index  . Barrett's esophagus   . CAD (coronary artery disease)   . Dementia ([place])   . Diverticulosis   .  Elevated BP   . Foot deformity   . GERD (gastroesophageal reflux disease)   . Glaucoma   . H/O: GI bleed   . HSV-2 (herpes simplex virus 2) infection   . Hyperglycemia   . Hypertension   . Migraine   . Osteoporosis    worsening  . Spondylosis    Past Surgical History:  Procedure Laterality Date  . AMPUTATION FINGER / THUMB     secondary to osteomyelitis  . CATARACT EXTRACTION, BILATERAL Bilateral 12/25/2016  . COLONOSCOPY    . ESOPHAGOGASTRODUODENOSCOPY N/A 10/22/2016   Procedure: ESOPHAGOGASTRODUODENOSCOPY (EGD);  Surgeon: Doran Stabler, MD;  Location: [facility] ENDOSCOPY;  Service: Endoscopy;  Laterality: N/A;  . FOOT SURGERY     right  . PARTIAL HYSTERECTOMY    . UPPER GASTROINTESTINAL ENDOSCOPY     No Known Allergies Prior to Admission medications   Medication Sig Start Date End Date Taking? Authorizing Provider  Calcium Carbonate-Vitamin D (CALCIUM-VITAMIN D) 500-200 MG-UNIT tablet Take 3 tablets by mouth every morning.   Yes [provider]  calcium-vitamin D (OSCAL WITH D) 500-200 MG-UNIT TABS tablet calcium carbonate 500 mg (1,250 mg)-vitamin D3 200 unit tablet   3 s by oral route.   Yes [provider]  diclofenac Sodium (VOLTAREN) 1 % GEL Voltaren 1 %  topical gel   Yes [provider]  Elastic Bandages & Supports (LUMBAR BACK BRACE/SUPPORT PAD) MISC Dispense one back brace to be worn while doing activities as needed daily 11/03/19  Yes Stallings, Zoe A, MD  ESTRACE VAGINAL 0.1 MG/GM vaginal cream INSERT 1 APPLICATORFUL VAGINALLY ONCE A WEEK 07/15/19  Yes Stallings, Zoe A, MD  FEROSUL 325 (65 Fe) MG tablet TAKE 1 TABLET(325 MG) BY MOUTH DAILY 01/12/20  Yes Delia Chimes A, MD  gabapentin (NEURONTIN) 800 MG tablet TAKE 1 TABLET(800 MG) BY MOUTH AT BEDTIME 09/30/20  Yes Wendie Agreste, MD  hydrochlorothiazide (MICROZIDE) 12.5 MG capsule TAKE 1 CAPSULE BY MOUTH EVERY Monday Wednesday AND Friday 07/05/20  Yes Maximiano Coss, NP  hydrocortisone 2.5 %  ointment APPLY EXTERNALLY TO THE AFFECTED AREA TWICE DAILY 05/31/18  Yes Wardell Honour, MD  hydroxypropyl methylcellulose (ISOPTO TEARS) 2.5 % ophthalmic solution Place 1 drop into both eyes 2 (two) times daily as needed for dry eyes.    Yes [provider]  lactose free nutrition (BOOST) LIQD Take 237 mLs by mouth 2 (two) times daily between meals. Patient taking differently: Take 1 Container by mouth 2 (two) times daily as needed (nutritional).  03/22/16  Yes [name], MD  latanoprost (XALATAN) 0.005 % ophthalmic solution INT 1 GTT IN OD QD 08/26/19  Yes [provider]  lidocaine (LIDODERM) 5 % Place 1 patch onto the skin daily. Remove & Discard patch within 12 hours or as directed by MD.  Apply to the right side of your neck where it hurts 05/25/18  Yes Plunkett, Loree Fee, MD  metoprolol succinate (TOPROL-XL) 50 MG 24 hr tablet TAKE 1 TABLET BY MOUTH EVERY DAY WITH OR IMMEDIATELY FOLLOWING A MEAL 03/03/20  Yes Forrest Moron, MD  NON FORMULARY    Yes [provider]  nystatin cream (MYCOSTATIN) Apply to external affected area twice a day for 10 days as needed. 11/08/19  Yes Stallings, Zoe A, MD  ofloxacin (OCUFLOX) 0.3 % ophthalmic solution 10 drops in right ear once daily for 5 days. 01/29/19  Yes Yu, Amy V, PA-C  pantoprazole (PROTONIX) 40 MG tablet TAKE 1 TABLET(40 MG) BY MOUTH TWICE DAILY 10/05/20  Yes Armbruster, Carlota Raspberry, MD  PREMARIN vaginal cream  10/10/19  Yes [provider]  promethazine (PHENERGAN) 12.5 MG tablet take 1 tablet by mouth every 6 hours if needed for nausea and vomiting 11/03/19  Yes Stallings, Zoe A, MD  sucralfate (CARAFATE) 1 g tablet Take 1 tablet (1 g total) by mouth every 6 (six) hours as needed. Slowly dissolve tablet in 1 Tablespoon of distilled water for 15 minutes before taking (do not crush) 07/15/19  Yes Armbruster, Carlota Raspberry, MD  tiZANidine (ZANAFLEX) 4 MG tablet Take 1 tablet (4 mg total) by mouth every 6 (six) hours as needed  for muscle spasms. 08/29/20  Yes Bast, Traci A, NP  traMADol-acetaminophen (ULTRACET) 37.5-325 MG tablet TAKE 1 TABLET BY MOUTH EVERY 6 HOURS 10/15/20  Yes Wendie Agreste, MD  triamcinolone cream (KENALOG) 0.1 % Apply topically 2 (two) times daily. 09/04/18  Yes Stallings, Zoe A, MD  verapamil (CALAN-SR) 240 MG CR tablet Take 1 tablet (240 mg total) by mouth daily. 11/03/19  Yes Forrest Moron, MD  vitamin C (ASCORBIC ACID) 500 MG tablet Take 500 mg by mouth daily.   Yes [provider]  ZINC OXIDE, TOPICAL, 10 % CREA 1 application to affected area to prevent moisture and further irritation of skin.  01/29/19  Yes Ok Edwards, PA-C   Social History   Socioeconomic History  . Marital status: Single    Spouse name: Not on file  . Number of children: 1  . Years of education: Not on file  . Highest education level: 12th grade  Occupational History    Employer: RETIRED  Tobacco Use  . Smoking status: Never Smoker  . Smokeless tobacco: Never Used  Vaping Use  . Vaping Use: Never used  Substance and Sexual Activity  . Alcohol use: No    Alcohol/week: 0.0 standard drinks  . Drug use: No  . Sexual activity: Never    Birth control/protection: Post-menopausal  Other Topics Concern  . Not on file  Social History Narrative   Marital status: divorced.      Children: 1 son; no grandchildren      Lives: with son      Employment: retired      Tobacco: none      Alcohol: none      Drugs: none       Exercise: none       ADLs:  Independent with ADLs.  Quit driving age 31.  Medical CNA cleans house; makes breakfast; patient pays bills.      Advanced Directives:   YES.  HCPOA:  Son/Isaac Bowman.  FULL CODE; no prolonged measures.   Social Determinants of Health   Financial Resource Strain:   . Difficulty of Paying Living Expenses: Not on file  Food Insecurity:   . Worried About Charity fundraiser in the Last Year: Not on file  . Ran Out of Food in the Last Year: Not on file   Transportation Needs:   . Lack of Transportation (Medical): Not on file  . Lack of Transportation (Non-Medical): Not on file  Physical Activity:   . Days of Exercise per Week: Not on file  . Minutes of Exercise per Session: Not on file  Stress:   . Feeling of Stress : Not on file  Social Connections:   . Frequency of Communication with Friends and Family: Not on file  . Frequency of Social Gatherings with Friends and Family: Not on file  . Attends Religious Services: Not on file  . Active Member of Clubs or Organizations: Not on file  . Attends Archivist Meetings: Not on file  . Marital Status: Not on file  Intimate Partner Violence:   . Fear of Current or Ex-Partner: Not on file  . Emotionally Abused: Not on file  . Physically Abused: Not on file  . Sexually Abused: Not on file    Review of Systems   Objective:   Vitals:   11/25/20 1456  BP: 116/60  Pulse: 63  Temp: 98.6 F (37 C)  TempSrc: Temporal  SpO2: 97%  Height: 4\' 10"  (1.473 m)     Physical Exam Vitals reviewed.  Constitutional:      General: She is not in acute distress.    Appearance: She is well-developed.  HENT:     Head: Normocephalic and atraumatic.  Cardiovascular:     Rate and Rhythm: Normal rate.  Pulmonary:     Effort: Pulmonary effort is normal.  Musculoskeletal:     Right lower leg: Edema (trace at ankle, foot bilaterally. ) present.     Left lower leg: Edema present.  Neurological:     Mental Status: She is alert and oriented to person, place, and time.        Cap  refill less than 1 second toes with warm toes.  Assessment & Plan:  [name] is a 86 y.o. female . Right foot pain  -Hallux valgus deformity as well as hammertoe, some discomfort lateral and metatarsalgia.  Denies new fall or injury, imaging reviewed from last visit.  Potentially metatarsal pad could take some pressure off her metatarsals but concerned that may worsen some of the impingement with her  hammertoe.  Symptomatic care discussed with corncallus pad over the second toe where there is a slight bit of abrasion, but no sign of infection seen on exam.  Lotion for dry skin discussed.  Has podiatry follow-up planned in 2 weeks, will try to see if she can be seen sooner if they have openings.  RTC precautions.  Continue physical therapy.  Denies recent falls  No orders of the defined types were placed in this encounter.  Patient Instructions   Try a lotion such as Aveeno daily dry skin on the feet.  You can try over-the-counter corn or callus pad for the top of your second toe so that does not rub on the shoe.  Some of the pain at your and your foot may be related to the movement of the toes, so I would like you to discuss that with podiatry.  They may want to fit you for a special insert of your shoe.  I will see if they can try to move up that visit if there are any cancellations.   Return to the clinic or go to the nearest emergency room if any of your symptoms worsen or new symptoms occur.  I am glad to hear physical therapy is going well, continue that to minimize chance of further falls.    If you have lab work done today you will be contacted with your lab results within the next 2 weeks.  If you have not heard from [place] then please contact us. The fastest way to get your results is to register for My Chart.   IF you received an x-ray today, you will receive an invoice from [facility]. Please contact [facility] at [phone] with questions or concerns regarding your invoice.   IF you received labwork today, you will receive an invoice from [facility]. Please contact [facility] at [phone] with questions or concerns regarding your invoice.   Our billing staff will not be able to assist you with questions regarding bills from these companies.  You will be contacted with the lab results as soon as they are available. The fastest way to get your results is to  activate your My Chart account. Instructions are located on the last page of this paperwork. If you have not heard from [place] regarding the results in 2 weeks, please contact this office.         Signed, Merri Ray, MD [facility]

## 2020-11-26 ENCOUNTER — Telehealth: Payer: Self-pay

## 2020-11-26 NOTE — Telephone Encounter (Signed)
I called the pt's podiatry office at Dr.Greenes request to get the pt a sooner appt or get her placed on a cancellation list. We managed to get the pt an appt on 12/01/2020 @ 8:45 am. We will call the pt and make sure this is an ok appt date and time for her.

## 2020-11-30 DIAGNOSIS — S7012XD Contusion of left thigh, subsequent encounter: Secondary | ICD-10-CM | POA: Diagnosis not present

## 2020-11-30 DIAGNOSIS — M1990 Unspecified osteoarthritis, unspecified site: Secondary | ICD-10-CM | POA: Diagnosis not present

## 2020-11-30 DIAGNOSIS — M479 Spondylosis, unspecified: Secondary | ICD-10-CM | POA: Diagnosis not present

## 2020-11-30 DIAGNOSIS — M810 Age-related osteoporosis without current pathological fracture: Secondary | ICD-10-CM | POA: Diagnosis not present

## 2020-11-30 DIAGNOSIS — M2041 Other hammer toe(s) (acquired), right foot: Secondary | ICD-10-CM | POA: Diagnosis not present

## 2020-11-30 DIAGNOSIS — M5136 Other intervertebral disc degeneration, lumbar region: Secondary | ICD-10-CM | POA: Diagnosis not present

## 2020-12-01 ENCOUNTER — Ambulatory Visit (INDEPENDENT_AMBULATORY_CARE_PROVIDER_SITE_OTHER): Payer: Medicare Other | Admitting: Podiatry

## 2020-12-01 ENCOUNTER — Ambulatory Visit: Payer: Medicare Other | Admitting: Podiatry

## 2020-12-01 ENCOUNTER — Other Ambulatory Visit: Payer: Self-pay

## 2020-12-01 DIAGNOSIS — M79676 Pain in unspecified toe(s): Secondary | ICD-10-CM

## 2020-12-01 DIAGNOSIS — B351 Tinea unguium: Secondary | ICD-10-CM | POA: Diagnosis not present

## 2020-12-01 DIAGNOSIS — M79674 Pain in right toe(s): Secondary | ICD-10-CM

## 2020-12-01 NOTE — Progress Notes (Signed)
   SUBJECTIVE Patient presents to office today complaining of elongated, thickened nails that cause pain while ambulating in shoes.  She is unable to trim her own nails.   Patient also states that she stubbed her right fourth toe approximately 2 months ago.  Its been painful and tender ever since.  There has been improvement over the last 2 months.  She went to the doctor and was not diagnosed with a fracture.  The toe was negative for fracture.  She presents for further evaluation patient is here for further evaluation and treatment.  Past Medical History:  Diagnosis Date  . Abnormal finding on Pap smear, ASCUS   . Acute renal insufficiency   . Amputation finger    index  . Barrett's esophagus   . CAD (coronary artery disease)   . Dementia ([place])   . Diverticulosis   . Elevated BP   . Foot deformity   . GERD (gastroesophageal reflux disease)   . Glaucoma   . H/O: GI bleed   . HSV-2 (herpes simplex virus 2) infection   . Hyperglycemia   . Hypertension   . Migraine   . Osteoporosis    worsening  . Spondylosis     OBJECTIVE General Patient is awake, alert, and oriented x 3 and in no acute distress. Derm Skin is dry and supple bilateral. Negative open lesions or macerations. Remaining integument unremarkable. Nails are tender, long, thickened and dystrophic with subungual debris, consistent with onychomycosis, 1-5 bilateral. No signs of infection noted. Vasc  DP and PT pedal pulses palpable bilaterally. Temperature gradient within normal limits.  Neuro Epicritic and protective threshold sensation grossly intact bilaterally.  Musculoskeletal Exam No symptomatic pedal deformities noted bilateral. Muscular strength within normal limits.  Hallux valgus deformity with hammertoes 2-5 bilateral.  There is some mild tenderness to palpation to the right fourth toe  ASSESSMENT 1. Onychodystrophic nails 1-5 bilateral with hyperkeratosis of nails.  2. Onychomycosis of nail due to dermatophyte  bilateral 3. Pain in foot bilateral 4.  Stubbing injury of the right fourth toe  PLAN OF CARE 1. Patient evaluated today.  2. Instructed to maintain good pedal hygiene and foot care.  3. Mechanical debridement of nails 1-5 bilaterally performed using a nail nipper. Filed with dremel without incident.  4.  Continue wearing diabetic shoes and exercising good foot hygiene  5.  Return to clinic in 3 mos.    Edrick Kins, DPM [facility]  Dr. Edrick Kins, [place]                                        [place], [place] [address]                Office [phone]  Fax [phone]

## 2020-12-04 DIAGNOSIS — M2041 Other hammer toe(s) (acquired), right foot: Secondary | ICD-10-CM | POA: Diagnosis not present

## 2020-12-04 DIAGNOSIS — K5790 Diverticulosis of intestine, part unspecified, without perforation or abscess without bleeding: Secondary | ICD-10-CM | POA: Diagnosis not present

## 2020-12-04 DIAGNOSIS — M810 Age-related osteoporosis without current pathological fracture: Secondary | ICD-10-CM | POA: Diagnosis not present

## 2020-12-04 DIAGNOSIS — K5900 Constipation, unspecified: Secondary | ICD-10-CM | POA: Diagnosis not present

## 2020-12-04 DIAGNOSIS — B009 Herpesviral infection, unspecified: Secondary | ICD-10-CM | POA: Diagnosis not present

## 2020-12-04 DIAGNOSIS — S7012XD Contusion of left thigh, subsequent encounter: Secondary | ICD-10-CM | POA: Diagnosis not present

## 2020-12-04 DIAGNOSIS — G43909 Migraine, unspecified, not intractable, without status migrainosus: Secondary | ICD-10-CM | POA: Diagnosis not present

## 2020-12-04 DIAGNOSIS — Z9181 History of falling: Secondary | ICD-10-CM | POA: Diagnosis not present

## 2020-12-04 DIAGNOSIS — M1990 Unspecified osteoarthritis, unspecified site: Secondary | ICD-10-CM | POA: Diagnosis not present

## 2020-12-04 DIAGNOSIS — K449 Diaphragmatic hernia without obstruction or gangrene: Secondary | ICD-10-CM | POA: Diagnosis not present

## 2020-12-04 DIAGNOSIS — F0390 Unspecified dementia without behavioral disturbance: Secondary | ICD-10-CM | POA: Diagnosis not present

## 2020-12-04 DIAGNOSIS — M5136 Other intervertebral disc degeneration, lumbar region: Secondary | ICD-10-CM | POA: Diagnosis not present

## 2020-12-04 DIAGNOSIS — Z79891 Long term (current) use of opiate analgesic: Secondary | ICD-10-CM | POA: Diagnosis not present

## 2020-12-04 DIAGNOSIS — D62 Acute posthemorrhagic anemia: Secondary | ICD-10-CM | POA: Diagnosis not present

## 2020-12-04 DIAGNOSIS — H409 Unspecified glaucoma: Secondary | ICD-10-CM | POA: Diagnosis not present

## 2020-12-04 DIAGNOSIS — K219 Gastro-esophageal reflux disease without esophagitis: Secondary | ICD-10-CM | POA: Diagnosis not present

## 2020-12-04 DIAGNOSIS — Z8619 Personal history of other infectious and parasitic diseases: Secondary | ICD-10-CM | POA: Diagnosis not present

## 2020-12-04 DIAGNOSIS — M479 Spondylosis, unspecified: Secondary | ICD-10-CM | POA: Diagnosis not present

## 2020-12-04 DIAGNOSIS — K259 Gastric ulcer, unspecified as acute or chronic, without hemorrhage or perforation: Secondary | ICD-10-CM | POA: Diagnosis not present

## 2020-12-04 DIAGNOSIS — G4730 Sleep apnea, unspecified: Secondary | ICD-10-CM | POA: Diagnosis not present

## 2020-12-04 DIAGNOSIS — K2270 Barrett's esophagus without dysplasia: Secondary | ICD-10-CM | POA: Diagnosis not present

## 2020-12-04 DIAGNOSIS — I10 Essential (primary) hypertension: Secondary | ICD-10-CM | POA: Diagnosis not present

## 2020-12-04 DIAGNOSIS — Z90710 Acquired absence of both cervix and uterus: Secondary | ICD-10-CM | POA: Diagnosis not present

## 2020-12-04 DIAGNOSIS — R296 Repeated falls: Secondary | ICD-10-CM | POA: Diagnosis not present

## 2020-12-04 DIAGNOSIS — I2510 Atherosclerotic heart disease of native coronary artery without angina pectoris: Secondary | ICD-10-CM | POA: Diagnosis not present

## 2020-12-09 DIAGNOSIS — M2041 Other hammer toe(s) (acquired), right foot: Secondary | ICD-10-CM | POA: Diagnosis not present

## 2020-12-09 DIAGNOSIS — M479 Spondylosis, unspecified: Secondary | ICD-10-CM | POA: Diagnosis not present

## 2020-12-09 DIAGNOSIS — M5136 Other intervertebral disc degeneration, lumbar region: Secondary | ICD-10-CM | POA: Diagnosis not present

## 2020-12-09 DIAGNOSIS — S7012XD Contusion of left thigh, subsequent encounter: Secondary | ICD-10-CM | POA: Diagnosis not present

## 2020-12-09 DIAGNOSIS — M810 Age-related osteoporosis without current pathological fracture: Secondary | ICD-10-CM | POA: Diagnosis not present

## 2020-12-09 DIAGNOSIS — M1990 Unspecified osteoarthritis, unspecified site: Secondary | ICD-10-CM | POA: Diagnosis not present

## 2020-12-10 ENCOUNTER — Ambulatory Visit: Payer: Medicare Other | Admitting: Podiatry

## 2020-12-10 ENCOUNTER — Telehealth: Payer: Self-pay | Admitting: Family Medicine

## 2020-12-10 NOTE — Telephone Encounter (Signed)
Pt is wanting a referral placed to Dr. Nelva Bush for back pain.  She says she can not walk from all of the pain. Please advise at [phone].

## 2020-12-11 ENCOUNTER — Emergency Department (HOSPITAL_COMMUNITY): Payer: Medicare Other

## 2020-12-11 ENCOUNTER — Encounter (HOSPITAL_COMMUNITY): Payer: Self-pay

## 2020-12-11 ENCOUNTER — Other Ambulatory Visit: Payer: Self-pay

## 2020-12-11 ENCOUNTER — Emergency Department (HOSPITAL_COMMUNITY)
Admission: EM | Admit: 2020-12-11 | Discharge: 2020-12-11 | Disposition: A | Discharge status: Home / Self Care | Payer: Medicare Other | Attending: Emergency Medicine | Admitting: Emergency Medicine

## 2020-12-11 DIAGNOSIS — Y92017 Garden or yard in single-family (private) house as the place of occurrence of the external cause: Secondary | ICD-10-CM | POA: Diagnosis not present

## 2020-12-11 DIAGNOSIS — M549 Dorsalgia, unspecified: Secondary | ICD-10-CM | POA: Diagnosis not present

## 2020-12-11 DIAGNOSIS — F0390 Unspecified dementia without behavioral disturbance: Secondary | ICD-10-CM | POA: Insufficient documentation

## 2020-12-11 DIAGNOSIS — M545 Low back pain, unspecified: Secondary | ICD-10-CM | POA: Diagnosis not present

## 2020-12-11 DIAGNOSIS — I110 Hypertensive heart disease with heart failure: Secondary | ICD-10-CM | POA: Diagnosis not present

## 2020-12-11 DIAGNOSIS — I2510 Atherosclerotic heart disease of native coronary artery without angina pectoris: Secondary | ICD-10-CM | POA: Insufficient documentation

## 2020-12-11 DIAGNOSIS — Y33XXXA Other specified events, undetermined intent, initial encounter: Secondary | ICD-10-CM | POA: Insufficient documentation

## 2020-12-11 DIAGNOSIS — I509 Heart failure, unspecified: Secondary | ICD-10-CM | POA: Insufficient documentation

## 2020-12-11 DIAGNOSIS — M546 Pain in thoracic spine: Secondary | ICD-10-CM | POA: Diagnosis not present

## 2020-12-11 DIAGNOSIS — I10 Essential (primary) hypertension: Secondary | ICD-10-CM | POA: Diagnosis not present

## 2020-12-11 DIAGNOSIS — Y93H2 Activity, gardening and landscaping: Secondary | ICD-10-CM | POA: Insufficient documentation

## 2020-12-11 DIAGNOSIS — Z79899 Other long term (current) drug therapy: Secondary | ICD-10-CM | POA: Diagnosis not present

## 2020-12-11 MED ORDER — ACETAMINOPHEN ER 650 MG PO TBCR: 650.0000 mg | EXTENDED_RELEASE_TABLET | Freq: Two times a day (BID) | ORAL | 0 refills | Status: DC | PRN

## 2020-12-11 MED ORDER — LIDOCAINE 5 % EX PTCH: 1.0000 | MEDICATED_PATCH | Freq: Every day | CUTANEOUS | 0 refills | Status: DC | PRN

## 2020-12-11 MED ORDER — TRAMADOL HCL 50 MG PO TABS
50.0000 mg | ORAL_TABLET | Freq: Once | ORAL | Status: AC
  Administered 2020-12-11: 17:00:00 50 mg via ORAL
  Filled 2020-12-11: qty 1

## 2020-12-11 MED ORDER — LIDOCAINE 5 % EX PTCH
1.0000 | MEDICATED_PATCH | CUTANEOUS | Status: DC
  Administered 2020-12-11: 17:00:00 1 via TRANSDERMAL
  Filled 2020-12-11: qty 1

## 2020-12-11 MED ORDER — ACETAMINOPHEN 500 MG PO TABS
1000.0000 mg | ORAL_TABLET | Freq: Once | ORAL | Status: AC
  Administered 2020-12-11: 17:00:00 1000 mg via ORAL
  Filled 2020-12-11: qty 2

## 2020-12-11 NOTE — ED Provider Notes (Signed)
[facility] EMERGENCY DEPARTMENT Provider Note   CSN: [id number] Arrival date & time: 12/11/20  1058     History Chief Complaint  Patient presents with  . Back Pain   [name] is a 86 y.o. female with a history of dementia, CAD, hypertension, hyperlipidemia, migraines, gastric ulcer, spondylosis, anemia, osteoporosis, and lumbar degenerative disc disease who presents to the ED with complaints of acute on chronic back pain that worsened yesterday. Patient states that she has chronic back pain at baseline, however yesterday after doing some yard work this seemed to get worse. Pain is to the generalized back but more so to the right side. It feels like spasms. Constant, worse with movement, minimal relief with her tramadol prescribed for her chronic back pain. Denies numbness, tingling, weakness, saddle anesthesia, incontinence to bowel/bladder, fever, chills, IV drug use, dysuria, or hx of cancer. Patient has not had prior back surgeries. Denies abdominal pain, chest pain, or dyspnea. Denies recent injury.    HPI     Past Medical History:  Diagnosis Date  . Abnormal finding on Pap smear, ASCUS   . Acute renal insufficiency   . Amputation finger    index  . Barrett's esophagus   . CAD (coronary artery disease)   . Dementia ([place])   . Diverticulosis   . Elevated BP   . Foot deformity   . GERD (gastroesophageal reflux disease)   . Glaucoma   . H/O: GI bleed   . HSV-2 (herpes simplex virus 2) infection   . Hyperglycemia   . Hypertension   . Migraine   . Osteoporosis    worsening  . Spondylosis     Patient Active Problem List   Diagnosis Date Noted  . Degenerative disc disease, lumbar 01/01/2018  . Hiatal hernia 11/04/2016  . Acute blood loss anemia   . Gastroesophageal reflux disease without esophagitis 07/31/2014  . Gastric ulcer without hemorrhage or perforation 07/22/2013  . History of syphilis 02/27/2012  . Dementia ([place])   . Spondylosis   .  Foot deformity   . Migraine   . CAD (coronary artery disease)   . HSV-2 (herpes simplex virus 2) infection   . Abnormal finding on Pap smear, ASCUS   . Osteoporosis   . CONSTIPATION 12/12/2010  . ANEMIA, IRON DEFICIENCY 06/17/2008  . BARRETTS ESOPHAGUS 06/17/2008  . GLAUCOMA 06/16/2008  . Essential hypertension 06/16/2008  . DIVERTICULOSIS, COLON 06/16/2008  . ARTHRITIS 06/16/2008  . SLEEP APNEA 06/16/2008    Past Surgical History:  Procedure Laterality Date  . AMPUTATION FINGER / THUMB     secondary to osteomyelitis  . CATARACT EXTRACTION, BILATERAL Bilateral 12/25/2016  . COLONOSCOPY    . ESOPHAGOGASTRODUODENOSCOPY N/A 10/22/2016   Procedure: ESOPHAGOGASTRODUODENOSCOPY (EGD);  Surgeon: Doran Stabler, MD;  Location: [facility] ENDOSCOPY;  Service: Endoscopy;  Laterality: N/A;  . FOOT SURGERY     right  . PARTIAL HYSTERECTOMY    . UPPER GASTROINTESTINAL ENDOSCOPY       OB History   No obstetric history on file.     Family History  Problem Relation Age of Onset  . Breast cancer Mother   . Heart attack Mother   . Heart disease Father   . Alcohol abuse Son   . Colon cancer Neg Hx   . Stomach cancer Neg Hx   . Rectal cancer Neg Hx     Social History   Tobacco Use  . Smoking status: Never Smoker  . Smokeless tobacco: Never Used  Vaping Use  . Vaping Use: Never used  Substance Use Topics  . Alcohol use: No    Alcohol/week: 0.0 standard drinks  . Drug use: No    Home Medications Prior to Admission medications   Medication Sig Start Date End Date Taking? Authorizing Provider  acetaminophen (TYLENOL 8 HOUR) 650 MG CR tablet Take 1 tablet (650 mg total) by mouth 2 (two) times daily as needed for pain. 12/11/20   Hazell Siwik R, PA-C  Calcium Carbonate-Vitamin D (CALCIUM-VITAMIN D) 500-200 MG-UNIT tablet Take 3 tablets by mouth every morning.    [provider]  calcium-vitamin D (OSCAL WITH D) 500-200 MG-UNIT TABS tablet calcium carbonate 500 mg  (1,250 mg)-vitamin D3 200 unit tablet   3 {tablets by oral route.    [provider]  diclofenac Sodium (VOLTAREN) 1 % GEL Voltaren 1 % topical gel    [provider]  Elastic Bandages & Supports (LUMBAR BACK BRACE/SUPPORT PAD) MISC Dispense one back brace to be worn while doing activities as needed daily 11/03/19   Delia Chimes A, MD  ESTRACE VAGINAL 0.1 MG/GM vaginal cream INSERT 1 APPLICATORFUL VAGINALLY ONCE A WEEK 07/15/19   Forrest Moron, MD  FEROSUL 325 (65 Fe) MG tablet TAKE 1 TABLET(325 MG) BY MOUTH DAILY 01/12/20   Forrest Moron, MD  gabapentin (NEURONTIN) 800 MG tablet TAKE 1 TABLET(800 MG) BY MOUTH AT BEDTIME 09/30/20   Wendie Agreste, MD  hydrochlorothiazide (MICROZIDE) 12.5 MG capsule TAKE 1 CAPSULE BY MOUTH EVERY Monday Wednesday AND Friday 07/05/20   Maximiano Coss, NP  hydrocortisone 2.5 % ointment APPLY EXTERNALLY TO THE AFFECTED AREA TWICE DAILY 05/31/18   Wardell Honour, MD  hydroxypropyl methylcellulose (ISOPTO TEARS) 2.5 % ophthalmic solution Place 1 drop into both eyes 2 (two) times daily as needed for dry eyes.     [provider]  lactose free nutrition (BOOST) LIQD Take 237 mLs by mouth 2 (two) times daily between meals. Patient taking differently: Take 1 Container by mouth 2 (two) times daily as needed (nutritional).  03/22/16   [name], MD  latanoprost (XALATAN) 0.005 % ophthalmic solution INT 1 GTT IN OD QD 08/26/19   [provider]  lidocaine (LIDODERM) 5 % Place 1 patch onto the skin daily as needed. Apply patch to area most significant pain once per day.  Remove and discard patch within 12 hours of application. 12/11/20   Loyalty Brashier R, PA-C  metoprolol succinate (TOPROL-XL) 50 MG 24 hr tablet TAKE 1 TABLET BY MOUTH EVERY DAY WITH OR IMMEDIATELY FOLLOWING A MEAL 03/03/20   Forrest Moron, MD  NON FORMULARY     [provider]  nystatin cream (MYCOSTATIN) Apply to external affected area twice a day for 10  days as needed. 11/08/19   Forrest Moron, MD  ofloxacin (OCUFLOX) 0.3 % ophthalmic solution 10 drops in right ear once daily for 5 days. 01/29/19   Tasia Catchings, Amy V, PA-C  pantoprazole (PROTONIX) 40 MG tablet TAKE 1 TABLET(40 MG) BY MOUTH TWICE DAILY 10/05/20   Armbruster, Carlota Raspberry, MD  PREMARIN vaginal cream  10/10/19   [provider]  promethazine (PHENERGAN) 12.5 MG tablet take 1 tablet by mouth every 6 hours if needed for nausea and vomiting 11/03/19   Delia Chimes A, MD  sucralfate (CARAFATE) 1 g tablet Take 1 tablet (1 g total) by mouth every 6 (six) hours as needed. Slowly dissolve tablet in 1 Tablespoon of distilled water for 15 minutes  before taking (do not crush) 07/15/19   Armbruster, Carlota Raspberry, MD  tiZANidine (ZANAFLEX) 4 MG tablet Take 1 tablet (4 mg total) by mouth every 6 (six) hours as needed for muscle spasms. 08/29/20   Orvan July, NP  traMADol-acetaminophen (ULTRACET) 37.5-325 MG tablet TAKE 1 TABLET BY MOUTH EVERY 6 HOURS 10/15/20   Wendie Agreste, MD  triamcinolone cream (KENALOG) 0.1 % Apply topically 2 (two) times daily. 09/04/18   Forrest Moron, MD  verapamil (CALAN-SR) 240 MG CR tablet Take 1 tablet (240 mg total) by mouth daily. 11/03/19   Forrest Moron, MD  vitamin C (ASCORBIC ACID) 500 MG tablet Take 500 mg by mouth daily.    [provider]  ZINC OXIDE, TOPICAL, 10 % CREA 1 application to affected area to prevent moisture and further irritation of skin. 01/29/19   Ok Edwards, PA-C    Allergies    Patient has no known allergies.  Review of Systems   Review of Systems  Constitutional: Negative for chills and fever.  Respiratory: Negative for cough and shortness of breath.   Cardiovascular: Negative for chest pain.  Gastrointestinal: Negative for abdominal pain, blood in stool, constipation, diarrhea and vomiting.  Genitourinary: Negative for dysuria and hematuria.  Musculoskeletal: Positive for back pain.  Skin: Negative for rash.  Neurological:  Negative for weakness and numbness.       Negative for incontinence or saddle anesthesia.  All other systems reviewed and are negative.   Physical Exam Updated Vital Signs BP (!) 130/55 (BP Location: Right Arm)   Pulse 65   Temp 97.6 F (36.4 C) (Oral)   Resp 18   SpO2 99%   Physical Exam Vitals and nursing note reviewed.  Constitutional:      General: She is not in acute distress.    Appearance: She is well-nourished. She is not toxic-appearing.  HENT:     Head: Normocephalic and atraumatic.  Eyes:     General:        Right eye: No discharge.        Left eye: No discharge.     Conjunctiva/sclera: Conjunctivae normal.  Neck:     Comments: No midline spinal tenderness. Cardiovascular:     Rate and Rhythm: Normal rate and regular rhythm.     Pulses: Intact distal pulses.     Comments: 2+ symmetric radial and DP pulses bilaterally. Pulmonary:     Effort: Pulmonary effort is normal. No respiratory distress.     Breath sounds: Normal breath sounds. No wheezing, rhonchi or rales.     Comments: Respiration even and unlabored Abdominal:     General: There is no distension.     Palpations: Abdomen is soft.     Tenderness: There is no abdominal tenderness.  Musculoskeletal:     Cervical back: Normal range of motion and neck supple. No spinous process tenderness or muscular tenderness.     Comments: No obvious deformity, appreciable swelling, erythema, ecchymosis, significant open wounds, or increased warmth. .  Back: No point/focal vertebral tenderness, no palpable step off or crepitus.  Patient has diffuse tenderness to the thoracic and lumbar region including midline and bilateral paraspinal muscles with most prominent tenderness to the right thoracic and lumbar paraspinal muscles. Lower extremities: Bunion deformities noted bilaterally.  Trace pitting edema to the bilateral lower legs.  Patient has intact active range of motion throughout the hips, knees, ankles, and all digits.   She is able to lift bilateral lower extremities  off of the bed and bend her knees without difficulty.  No focal tenderness to the lower extremities  Skin:    General: Skin is warm and dry.     Findings: No rash.  Neurological:     Mental Status: She is alert.     Deep Tendon Reflexes:     Reflex Scores:      Patellar reflexes are 2+ on the right side and 2+ on the left side.    Comments: Sensation grossly intact to bilateral lower extremities. 5/5 symmetric strength with plantar/dorsiflexion bilaterally.  Antalgic gait with cane  Psychiatric:        Mood and Affect: Mood and affect normal.        Behavior: Behavior normal.    ED Results / Procedures / Treatments   Labs (all labs ordered are listed, but only abnormal results are displayed) Labs Reviewed - No data to display  EKG EKG Interpretation  Date/Time:  Saturday December 11 2020 17:01:38 EST Ventricular Rate:  63 PR Interval:    QRS Duration: 86 QT Interval:  420 QTC Calculation: 430 R Axis:   14 Text Interpretation: Sinus rhythm LVH with secondary repolarization abnormality No STEMI Confirmed by Octaviano Glow [phone]) on 12/11/2020 6:26:47 PM   Radiology CT Thoracic Spine Wo Contrast  Result Date: 12/11/2020 CLINICAL DATA:  Mid back pain EXAM: CT THORACIC AND LUMBAR SPINE WITHOUT CONTRAST TECHNIQUE: Multidetector CT imaging of the thoracic and lumbar spine was performed without contrast. Multiplanar CT image reconstructions were also generated. COMPARISON:  Cervical CT 03/18/2018 FINDINGS: CT THORACIC SPINE FINDINGS Alignment: Mild cervicothoracic curvature convex to the right. Mild thoracic curvature convex to the left. Vertebrae: No evidence of fracture or focal bone lesion. Paraspinal and other soft tissues: Hiatal hernia. Disc levels: Degenerative disc disease throughout the thoracic region with disc space narrowing and vacuum phenomenon. Annular bulging to a minor degree from T6-7 through T11-12. No evidence of soft  disc herniation or compressive stenosis of the canal or foramina. Ordinary mild facet osteoarthritis and costovertebral osteoarthritis. No advanced finding. CT LUMBAR SPINE FINDINGS Segmentation: 5 lumbar type vertebral bodies. Alignment: Thoracolumbar curvature convex to the left and lower lumbar curvature convex to the right. Vertebrae: No fracture or primary bone lesion. Chronic discogenic endplate changes with sclerosis and cyst formation. Paraspinal and other soft tissues: Aortic atherosclerosis without evidence of aneurysm. Disc levels: L1-2: Disc degeneration with vacuum phenomenon. Mild bulging of the disc. No compressive stenosis. L2-3: Disc degeneration of vacuum phenomenon. Endplate osteophytes and mild bulging of the disc. Facet osteoarthritis. Right foraminal narrowing. L3-4: Disc degeneration with vacuum phenomenon. Endplate osteophytes and bulging of the disc. Facet osteoarthritis. No compressive stenosis. L4-5: Disc degeneration with loss of height and vacuum phenomenon. Small endplate osteophytes. Facet osteoarthritis. No compressive canal or foraminal narrowing. L5-S1: Disc degeneration with loss of disc height and vacuum phenomenon. Endplate osteophytes. Facet osteoarthritis. No apparent compressive stenosis. Bilateral sacroiliac osteoarthritis is present. IMPRESSION: Spinal curvature. Chronic degenerative disc disease throughout the thoracic and lumbar region, most pronounced in the lumbar region where there is advanced loss of disc height and vacuum phenomenon throughout. The findings in general could certainly relate to chronic back pain. There is no evidence of a compression fracture, either old or acute. The patient does not appear to have compressive stenosis of the thoracic or lumbar spinal canal. There is facet osteoarthritis at the region which also could contribute to back pain. There is bilateral sacroiliac osteoarthritis which could be painful. Electronically Signed  By: Nelson Chimes  M.D.   On: 12/11/2020 18:46   CT Lumbar Spine Wo Contrast  Result Date: 12/11/2020 CLINICAL DATA:  Mid back pain EXAM: CT THORACIC AND LUMBAR SPINE WITHOUT CONTRAST TECHNIQUE: Multidetector CT imaging of the thoracic and lumbar spine was performed without contrast. Multiplanar CT image reconstructions were also generated. COMPARISON:  Cervical CT 03/18/2018 FINDINGS: CT THORACIC SPINE FINDINGS Alignment: Mild cervicothoracic curvature convex to the right. Mild thoracic curvature convex to the left. Vertebrae: No evidence of fracture or focal bone lesion. Paraspinal and other soft tissues: Hiatal hernia. Disc levels: Degenerative disc disease throughout the thoracic region with disc space narrowing and vacuum phenomenon. Annular bulging to a minor degree from T6-7 through T11-12. No evidence of soft disc herniation or compressive stenosis of the canal or foramina. Ordinary mild facet osteoarthritis and costovertebral osteoarthritis. No advanced finding. CT LUMBAR SPINE FINDINGS Segmentation: 5 lumbar type vertebral bodies. Alignment: Thoracolumbar curvature convex to the left and lower lumbar curvature convex to the right. Vertebrae: No fracture or primary bone lesion. Chronic discogenic endplate changes with sclerosis and cyst formation. Paraspinal and other soft tissues: Aortic atherosclerosis without evidence of aneurysm. Disc levels: L1-2: Disc degeneration with vacuum phenomenon. Mild bulging of the disc. No compressive stenosis. L2-3: Disc degeneration of vacuum phenomenon. Endplate osteophytes and mild bulging of the disc. Facet osteoarthritis. Right foraminal narrowing. L3-4: Disc degeneration with vacuum phenomenon. Endplate osteophytes and bulging of the disc. Facet osteoarthritis. No compressive stenosis. L4-5: Disc degeneration with loss of height and vacuum phenomenon. Small endplate osteophytes. Facet osteoarthritis. No compressive canal or foraminal narrowing. L5-S1: Disc degeneration with loss of  disc height and vacuum phenomenon. Endplate osteophytes. Facet osteoarthritis. No apparent compressive stenosis. Bilateral sacroiliac osteoarthritis is present. IMPRESSION: Spinal curvature. Chronic degenerative disc disease throughout the thoracic and lumbar region, most pronounced in the lumbar region where there is advanced loss of disc height and vacuum phenomenon throughout. The findings in general could certainly relate to chronic back pain. There is no evidence of a compression fracture, either old or acute. The patient does not appear to have compressive stenosis of the thoracic or lumbar spinal canal. There is facet osteoarthritis at the region which also could contribute to back pain. There is bilateral sacroiliac osteoarthritis which could be painful. Electronically Signed   By: Nelson Chimes M.D.   On: 12/11/2020 18:46    Procedures Procedures (including critical care time)  Medications Ordered in ED Medications - No data to display  ED Course  I have reviewed the triage vital signs and the nursing notes.  Pertinent labs & imaging results that were available during my care of the patient were reviewed by me and considered in my medical decision making (see chart for details).  Clinical Course as of 12/11/20 1924  Sat Dec 11, 2020  1625 86 yo female w/ kyphosis, chronic back pain, presenting with back pain.  She reports she was doing lots of exercise in the yard yesterday, cleaning the house, and is typically very active.  She began having a pulling pain in her back.  It is worse with movement and walking, better while at rest.  On exam she has reproducible trigger point trapezius tenderness on the right mid-thoracic back, but also reports midline T and L spine ttp.  No trauma or falls reported.  Plan on CT T and L spine.  I have an overall low suspicion for ACS as this pain is so exquisitely reproducible on exam and worse with movement,  suggestive of MSK etiology, but given her age and  exertional onset I think a screening ECG would be reasonable.  We'll treat with pain medication, lidoderm patch as well.   [MT]  1826 ECG shows NSR without acute ischemic findings [MT]    Clinical Course User Index [MT] Trifan, Carola Rhine, MD   MDM Rules/Calculators/A&P                          Patient presents to the ED with complaints of acute on chronic back pain.  Patient is nontoxic, vitals without significant abnormality-BP somewhat elevated, low suspicion for hypertensive emergency.  On exam patient's back pain is completely reproducible with palpation, seems primarily to the right thoracic and lumbar paraspinal muscles.  She has no neurologic deficits and is able to ambulate with an antalgic gait, I do not suspect significant cord compression or cauda equina syndrome.  No overlying rash to suggest shingles.  She has no urinary symptoms to raise concern for nephrolithiasis or pyelonephritis.  With reproducibility of pain, no neuro deficits, and symmetric pulses throughout I have a low suspicion for dissection.  Will obtain an EKG given patient had pain after doing yard work to screen for ACS however suspicion is very low, and we will also obtain a CT thoracic and lumbar spine to evaluate for possible fracture or cancerous processes.  We have ordered Lidoderm patch, Tylenol, and her home dose of tramadol to help with pain.  Additional history obtained:  Additional history obtained from chart review & nursing note review. Previous records obtained and reviewed  Seen @ [facility] for similar 08/30/20- given prescription for zanaflex It appears she also had a chiropracter visit 08/27/20 where she received trigger point injections as well as a steroid shot  EKG: No STEMI, again feel ACS is unlikely.   Imaging Studies ordered:  I ordered imaging studies which included CT T & L spine wo contrast, I independently visualized and interpreted imaging which showed  Spinal curvature. Chronic degenerative disc  disease throughout the thoracic and lumbar region, most pronounced in the lumbar region where there is advanced loss of disc height and vacuum phenomenon throughout. The findings in general could certainly relate to chronic back pain. There is no evidence of a compression fracture, either old or acute. The patient does not appear to have compressive stenosis of the thoracic or lumbar spinal canal. There is facet osteoarthritis at the region which also could contribute to back pain. There is bilateral sacroiliac osteoarthritis which could be painful  Overall CT imaging with chronic/degenerative findings, no acute process noted. On reassessment patient is feeling improved, her pain is better, she is able to ambulate, and feels ready to go home.  This overall seems like an acute on chronic problem per patient presentation, chart review, and ED work-up.  We will discharge her with Tylenol and Lidoderm patch prescriptions.  Encouraged her to apply heat.  Follow-up with primary care. I discussed results, treatment plan, need for follow-up, and return precautions with the patient. Provided opportunity for questions, patient confirmed understanding and is in agreement with plan.   This is a shared visit with supervising physician Dr. Langston Masker who has independently evaluated patient & provided guidance in evaluation/management/disposition, in agreement with care   Portions of this note were generated with Dragon dictation software. Dictation errors may occur despite best attempts at proofreading.  Final Clinical Impression(s) / ED Diagnoses Final diagnoses:  Bilateral back pain, unspecified back location,  unspecified chronicity    Rx / DC Orders ED Discharge Orders         Ordered    lidocaine (LIDODERM) 5 %  Daily PRN        12/11/20 1934    acetaminophen (TYLENOL 8 HOUR) 650 MG CR tablet  2 times daily PRN        12/11/20 1934           Leafy Kindle 12/11/20 1936    Wyvonnia Dusky, MD 12/12/20 0045

## 2020-12-11 NOTE — Discharge Instructions (Addendum)
You were seen in the emergency department today for back pain.  Your CT scan showed multiple areas of degeneration and arthritis in your spine, however there were no fractures.  Your EKG was reassuring.  We are sending you home with Lidoderm patches, please apply 1 patch to area most of the pain once per day as needed, remove within 12 hours of application.  We are also sending home with Tylenol to take every 12 hours as needed for pain.   We have prescribed you new medication(s) today. Discuss the medications prescribed today with your pharmacist as they can have adverse effects and interactions with your other medicines including over the counter and prescribed medications. Seek medical evaluation if you start to experience new or abnormal symptoms after taking one of these medicines, seek care immediately if you start to experience difficulty breathing, feeling of your throat closing, facial swelling, or rash as these could be indications of a more serious allergic reaction  Please try to avoid strenuous activity.  Apply heat to your back to help with muscle spasms.  Please follow-up with your primary care provider within 3 days.  Return to the ER for new or worsening symptoms including but not limited to worsening pain, inability to walk, numbness, weakness, urinary problems, fever, chest pain, trouble breathing, abdominal pain, passing out, or any other concerns.

## 2020-12-11 NOTE — ED Triage Notes (Signed)
Patient complains of thoracic back pain for several months and years and has gotten worse x 2 days, denies any new trauma. denies dysuria.

## 2020-12-13 ENCOUNTER — Telehealth: Payer: Self-pay | Admitting: Family Medicine

## 2020-12-13 NOTE — Telephone Encounter (Signed)
Patient scheduled for a hospital follow up. 12-15-2020 Just

## 2020-12-13 NOTE — Telephone Encounter (Signed)
Received a fax from after hours about pt going to ED for  Back pain. She stated she might need some sort of pads. Pt would like nurse to call her to discuss some of the symptoms she is having. Please advise.

## 2020-12-14 DIAGNOSIS — M5136 Other intervertebral disc degeneration, lumbar region: Secondary | ICD-10-CM | POA: Diagnosis not present

## 2020-12-14 DIAGNOSIS — M810 Age-related osteoporosis without current pathological fracture: Secondary | ICD-10-CM | POA: Diagnosis not present

## 2020-12-14 DIAGNOSIS — M2041 Other hammer toe(s) (acquired), right foot: Secondary | ICD-10-CM | POA: Diagnosis not present

## 2020-12-14 DIAGNOSIS — M479 Spondylosis, unspecified: Secondary | ICD-10-CM | POA: Diagnosis not present

## 2020-12-14 DIAGNOSIS — S7012XD Contusion of left thigh, subsequent encounter: Secondary | ICD-10-CM | POA: Diagnosis not present

## 2020-12-14 DIAGNOSIS — M1990 Unspecified osteoarthritis, unspecified site: Secondary | ICD-10-CM | POA: Diagnosis not present

## 2020-12-15 ENCOUNTER — Encounter: Payer: Self-pay | Admitting: Family Medicine

## 2020-12-15 ENCOUNTER — Other Ambulatory Visit: Payer: Self-pay

## 2020-12-15 ENCOUNTER — Ambulatory Visit (INDEPENDENT_AMBULATORY_CARE_PROVIDER_SITE_OTHER): Payer: Medicare Other | Admitting: Family Medicine

## 2020-12-15 VITALS — BP 156/76 | HR 88 | Temp 98.3°F | Ht <= 58 in | Wt 119.0 lb

## 2020-12-15 DIAGNOSIS — K219 Gastro-esophageal reflux disease without esophagitis: Secondary | ICD-10-CM

## 2020-12-15 DIAGNOSIS — R82998 Other abnormal findings in urine: Secondary | ICD-10-CM

## 2020-12-15 DIAGNOSIS — K5909 Other constipation: Secondary | ICD-10-CM

## 2020-12-15 DIAGNOSIS — N898 Other specified noninflammatory disorders of vagina: Secondary | ICD-10-CM | POA: Diagnosis not present

## 2020-12-15 DIAGNOSIS — M5136 Other intervertebral disc degeneration, lumbar region: Secondary | ICD-10-CM

## 2020-12-15 LAB — POCT WET + KOH PREP
Trich by wet prep: ABSENT
Yeast by KOH: ABSENT
Yeast by wet prep: ABSENT

## 2020-12-15 LAB — POCT URINALYSIS DIPSTICK (MANUAL)
Leukocytes, UA: NEGATIVE
Nitrite, UA: POSITIVE — AB
Poct Bilirubin: NEGATIVE
Poct Blood: NEGATIVE
Poct Glucose: NORMAL mg/dL
Poct Ketones: NEGATIVE
Poct Protein: NEGATIVE mg/dL
Poct Urobilinogen: NORMAL mg/dL
Spec Grav, UA: 1.02 (ref 1.010–1.025)
pH, UA: 7 (ref 5.0–8.0)

## 2020-12-15 MED ORDER — POLYETHYLENE GLYCOL 3350 17 GM/SCOOP PO POWD: 17.0000 g | Freq: Every day | ORAL | 2 refills | Status: DC

## 2020-12-15 MED ORDER — TRAMADOL-ACETAMINOPHEN 37.5-325 MG PO TABS: 1.0000 | ORAL_TABLET | Freq: Four times a day (QID) | ORAL | 0 refills | Status: DC

## 2020-12-15 MED ORDER — PANTOPRAZOLE SODIUM 40 MG PO TBEC: DELAYED_RELEASE_TABLET | ORAL | 1 refills | Status: DC

## 2020-12-15 MED ORDER — MELOXICAM 7.5 MG PO TABS: 7.5000 mg | ORAL_TABLET | Freq: Every day | ORAL | 0 refills | Status: DC

## 2020-12-15 MED ORDER — DICLOFENAC SODIUM 1 % EX GEL: 2.0000 g | Freq: Four times a day (QID) | CUTANEOUS | 3 refills | Status: DC

## 2020-12-15 MED ORDER — LIDOCAINE 5 % EX PTCH: 1.0000 | MEDICATED_PATCH | Freq: Every day | CUTANEOUS | 1 refills | Status: DC | PRN

## 2020-12-15 NOTE — Progress Notes (Addendum)
12/22/20214:01 PM  [name] Mar 08, 1933, 86 y.o., female [id number]  Chief Complaint  Patient presents with  . Back Pain    ER visit for back pain 12/11/20 all over - has made walking more difficult     HPI:   Patient is a 86 y.o. female with past medical history significant for HTN, migraines, arthritis, and CAD who presents today for ED follow up.    12/8 ED: She began having a pulling pain in her back.  It is worse with movement and walking, better while at rest.  On exam she has reproducible trigger point trapezius tenderness on the right mid-thoracic back, but also reports midline T and L spine ttp.  No trauma or falls reported.   CT and ECG negative Treated with Lidocaine patch, Tylenol and tramadol Has previously seen chiropractor for this and received steroid shots  Has had chronic issues with right toe pain and back pain Multiple recent ED, UC, and clinic visits Has hammer toes Takes Tramadol one a day but 2 at most Has been using a heating pad NSAID medications give her reflux  Right lowerer back Has a salon-pas patch on Lost son last year Son used to drink too much  Takes colace for constipation Takes protonix for GERD   Depression screen [facility] 2/9 12/15/2020 11/25/2020 10/28/2020  Decreased Interest 0 0 0  Down, Depressed, Hopeless 0 0 0  PHQ - 2 Score 0 0 0  Altered sleeping - - -  Tired, decreased energy - - -  Change in appetite - - -  Feeling bad or failure about yourself  - - -  Trouble concentrating - - -  Moving slowly or fidgety/restless - - -  Suicidal thoughts - - -  PHQ-9 Score - - -  Difficult doing work/chores - - -  Some recent data might be hidden    Fall Risk  12/15/2020 11/25/2020 10/29/2020 10/28/2020 10/11/2020  Falls in the past year? 0 1 1 1  0  Number falls in past yr: 0 0 0 0 0  Injury with Fall? 0 0 1 0 0  Risk for fall due to : - - History of fall(s) - No Fall Risks  Follow up Falls evaluation completed Falls evaluation  completed Falls evaluation completed Falls evaluation completed Falls evaluation completed     No Known Allergies  Prior to Admission medications   Medication Sig Start Date End Date Taking? Authorizing Provider  acetaminophen (TYLENOL 8 HOUR) 650 MG CR tablet Take 1 tablet (650 mg total) by mouth 2 (two) times daily as needed for pain. 12/11/20   Petrucelli, Samantha R, PA-C  Calcium Carbonate-Vitamin D (CALCIUM-VITAMIN D) 500-200 MG-UNIT tablet Take 3 tablets by mouth every morning.    [provider]  calcium-vitamin D (OSCAL WITH D) 500-200 MG-UNIT TABS tablet calcium carbonate 500 mg (1,250 mg)-vitamin D3 200 unit tablet   3 tbls by oral route.    [provider]  diclofenac Sodium (VOLTAREN) 1 % GEL Voltaren 1 % topical gel    [provider]  Elastic Bandages & Supports (LUMBAR BACK BRACE/SUPPORT PAD) MISC Dispense one back brace to be worn while doing activities as needed daily 11/03/19   Delia Chimes A, MD  ESTRACE VAGINAL 0.1 MG/GM vaginal cream INSERT 1 APPLICATORFUL VAGINALLY ONCE A WEEK 07/15/19   Delia Chimes A, MD  FEROSUL 325 (65 Fe) MG tablet TAKE 1 TABLET(325 MG) BY MOUTH DAILY 01/12/20   Forrest Moron, MD  gabapentin (NEURONTIN)  800 MG tablet TAKE 1 TABLET(800 MG) BY MOUTH AT BEDTIME 09/30/20   Wendie Agreste, MD  hydrochlorothiazide (MICROZIDE) 12.5 MG capsule TAKE 1 CAPSULE BY MOUTH EVERY Monday Wednesday AND Friday 07/05/20   Maximiano Coss, NP  hydrocortisone 2.5 % ointment APPLY EXTERNALLY TO THE AFFECTED AREA TWICE DAILY 05/31/18   Wardell Honour, MD  hydroxypropyl methylcellulose (ISOPTO TEARS) 2.5 % ophthalmic solution Place 1 drop into both eyes 2 (two) times daily as needed for dry eyes.     [provider]  lactose free nutrition (BOOST) LIQD Take 237 mLs by mouth 2 (two) times daily between meals. Patient taking differently: Take 1 Container by mouth 2 (two) times daily as needed (nutritional).  03/22/16   [name], MD   latanoprost (XALATAN) 0.005 % ophthalmic solution INT 1 GTT IN OD QD 08/26/19   [provider]  lidocaine (LIDODERM) 5 % Place 1 patch onto the skin daily as needed. Apply patch to area most significant pain once per day.  Remove and discard patch within 12 hours of application. 12/11/20   Petrucelli, Samantha R, PA-C  metoprolol succinate (TOPROL-XL) 50 MG 24 hr tablet TAKE 1 TABLET BY MOUTH EVERY DAY WITH OR IMMEDIATELY FOLLOWING A MEAL 03/03/20   Forrest Moron, MD  NON FORMULARY     [provider]  nystatin cream (MYCOSTATIN) Apply to external affected area twice a day for 10 days as needed. 11/08/19   Forrest Moron, MD  ofloxacin (OCUFLOX) 0.3 % ophthalmic solution 10 drops in right ear once daily for 5 days. 01/29/19   Tasia Catchings, Amy V, PA-C  pantoprazole (PROTONIX) 40 MG tablet TAKE 1 TABLET(40 MG) BY MOUTH TWICE DAILY 10/05/20   Armbruster, Carlota Raspberry, MD  PREMARIN vaginal cream  10/10/19   [provider]  promethazine (PHENERGAN) 12.5 MG tablet take 1 tablet by mouth every 6 hours if needed for nausea and vomiting 11/03/19   Delia Chimes A, MD  sucralfate (CARAFATE) 1 g tablet Take 1 tablet (1 g total) by mouth every 6 (six) hours as needed. Slowly dissolve tablet in 1 Tablespoon of distilled water for 15 minutes before taking (do not crush) 07/15/19   Armbruster, Carlota Raspberry, MD  tiZANidine (ZANAFLEX) 4 MG tablet Take 1 tablet (4 mg total) by mouth every 6 (six) hours as needed for muscle spasms. 08/29/20   Orvan July, NP  traMADol-acetaminophen (ULTRACET) 37.5-325 MG tablet TAKE 1 TABLET BY MOUTH EVERY 6 HOURS 10/15/20   Wendie Agreste, MD  triamcinolone cream (KENALOG) 0.1 % Apply topically 2 (two) times daily. 09/04/18   Forrest Moron, MD  verapamil (CALAN-SR) 240 MG CR tablet Take 1 tablet (240 mg total) by mouth daily. 11/03/19   Forrest Moron, MD  vitamin C (ASCORBIC ACID) 500 MG tablet Take 500 mg by mouth daily.    [provider]  ZINC OXIDE,  TOPICAL, 10 % CREA 1 application to affected area to prevent moisture and further irritation of skin. 01/29/19   Ok Edwards, PA-C    Past Medical History:  Diagnosis Date  . Abnormal finding on Pap smear, ASCUS   . Acute renal insufficiency   . Amputation finger    index  . Barrett's esophagus   . CAD (coronary artery disease)   . Dementia ([place])   . Diverticulosis   . Elevated BP   . Foot deformity   . GERD (gastroesophageal reflux disease)   . Glaucoma   . H/O:  GI bleed   . HSV-2 (herpes simplex virus 2) infection   . Hyperglycemia   . Hypertension   . Migraine   . Osteoporosis    worsening  . Spondylosis     Past Surgical History:  Procedure Laterality Date  . AMPUTATION FINGER / THUMB     secondary to osteomyelitis  . CATARACT EXTRACTION, BILATERAL Bilateral 12/25/2016  . COLONOSCOPY    . ESOPHAGOGASTRODUODENOSCOPY N/A 10/22/2016   Procedure: ESOPHAGOGASTRODUODENOSCOPY (EGD);  Surgeon: Doran Stabler, MD;  Location: [facility] ENDOSCOPY;  Service: Endoscopy;  Laterality: N/A;  . FOOT SURGERY     right  . PARTIAL HYSTERECTOMY    . UPPER GASTROINTESTINAL ENDOSCOPY      Social History   Tobacco Use  . Smoking status: Never Smoker  . Smokeless tobacco: Never Used  Substance Use Topics  . Alcohol use: No    Alcohol/week: 0.0 standard drinks    Family History  Problem Relation Age of Onset  . Breast cancer Mother   . Heart attack Mother   . Heart disease Father   . Alcohol abuse Son   . Colon cancer Neg Hx   . Stomach cancer Neg Hx   . Rectal cancer Neg Hx     Review of Systems  Constitutional: Negative for chills, fever and malaise/fatigue.  Eyes: Negative for blurred vision and double vision.  Respiratory: Negative for cough, shortness of breath and wheezing.   Cardiovascular: Negative for chest pain, palpitations and leg swelling.  Gastrointestinal: Positive for constipation and heartburn. Negative for abdominal pain, blood in stool, diarrhea, nausea and  vomiting.  Genitourinary: Negative for dysuria, flank pain, frequency, hematuria and urgency.       Vaginal dryness  Musculoskeletal: Positive for back pain. Negative for joint pain.       Right toe pain  Skin: Negative for rash.  Neurological: Positive for weakness (generalized). Negative for dizziness and headaches.    OBJECTIVE:  Today's Vitals   12/15/20 1425  BP: (!) 156/76  Pulse: 88  Temp: 98.3 F (36.8 C)  SpO2: 97%  Weight: 119 lb (54 kg)  Height: 4\' 10"  (1.473 m)   Body mass index is 24.87 kg/m.   Physical Exam Constitutional:      General: She is not in acute distress.    Appearance: Normal appearance. She is not ill-appearing.  HENT:     Head: Normocephalic.  Cardiovascular:     Rate and Rhythm: Normal rate and regular rhythm.     Pulses: Normal pulses.     Heart sounds: Normal heart sounds. No murmur heard. No friction rub. No gallop.   Pulmonary:     Effort: Pulmonary effort is normal. No respiratory distress.     Breath sounds: Normal breath sounds. No stridor. No wheezing, rhonchi or rales.  Abdominal:     General: Bowel sounds are normal.     Palpations: Abdomen is soft.     Tenderness: There is no abdominal tenderness.  Musculoskeletal:     Thoracic back: Normal.     Lumbar back: Normal.       Back:     Right lower leg: Edema present.     Left lower leg: Edema present.  Skin:    General: Skin is warm and dry.     Comments: Left foot hammer toe, no erythema or new deformities noted, no pain on palpation  Neurological:     Mental Status: She is alert and oriented to person, place, and time.  Psychiatric:  Mood and Affect: Mood normal.        Behavior: Behavior normal.     Results for orders placed or performed in visit on 12/15/20 (from the past 24 hour(s))  POCT Wet + KOH Prep     Status: None   Collection Time: 12/15/20  3:36 PM  Result Value Ref Range   Yeast by KOH Absent Absent   Yeast by wet prep Absent Absent   WBC by wet  prep Few Few   Clue Cells Wet Prep HPF POC None None   Trich by wet prep Absent Absent   Bacteria Wet Prep HPF POC Few Few   Epithelial Cells By [employer] ([facility]) Few None, Few, Too numerous to count   RBC,UR,HPF,POC None None RBC/hpf  POCT Urinalysis Dip Manual     Status: Abnormal   Collection Time: 12/15/20  3:38 PM  Result Value Ref Range   Spec Grav, UA 1.020 1.010 - 1.025   pH, UA 7.0 5.0 - 8.0   Leukocytes, UA Negative Negative   Nitrite, UA Positive (A) Negative   Poct Protein Negative Negative, trace mg/dL   Poct Glucose Normal Normal mg/dL   Poct Ketones Negative Negative   Poct Urobilinogen Normal Normal mg/dL   Poct Bilirubin Negative Negative   Poct Blood Negative Negative, trace    No results found.   ASSESSMENT and PLAN  Problem List Items Addressed This Visit      Digestive   Gastroesophageal reflux disease without esophagitis   Relevant Medications   polyethylene glycol powder (GLYCOLAX/MIRALAX) 17 GM/SCOOP powder   pantoprazole (PROTONIX) 40 MG tablet     Musculoskeletal and Integument   Degenerative disc disease, lumbar   Relevant Medications   lidocaine (LIDODERM) 5 %   traMADol-acetaminophen (ULTRACET) 37.5-325 MG tablet   meloxicam (MOBIC) 7.5 MG tablet   diclofenac Sodium (VOLTAREN) 1 % GEL  R/se/b of medications discussed RTC precautions provided    Other Visit Diagnoses    Vaginal dryness    -  Primary   Relevant Orders   POCT Wet + KOH Prep (Completed)   POCT Urinalysis Dip Manual (Completed): positive for nitrites   Urine Culture  Discussed the multiple etiologies including estrogen deficiency   Other constipation       Relevant Medications   polyethylene glycol powder (GLYCOLAX/MIRALAX) 17 GM/SCOOP powder  Continue with daily colace   Other abnormal findings in urine        Relevant Orders   Urine Culture: due to dipstick positive for nitrites       Return in about 3 months (around 03/15/2021).    Huston Foley Carold Eisner,  FNP-BC [facility] [place], [place] [address] Ph.  [phone] Fax [phone]

## 2020-12-15 NOTE — Patient Instructions (Addendum)
Constipation, Adult Constipation is when a person:  Poops (has a bowel movement) fewer times in a week than normal.  Has a hard time pooping.  Has poop that is dry, hard, or bigger than normal. Follow these instructions at home: Eating and drinking   Eat foods that have a lot of fiber, such as: ? Fresh fruits and vegetables. ? Whole grains. ? Beans.  Eat less of foods that are high in fat, low in fiber, or overly processed, such as: ? [place] fries. ? Hamburgers. ? Cookies. ? Candy. ? Soda.  Drink enough fluid to keep your pee (urine) clear or pale yellow. General instructions  Exercise regularly or as told by your doctor.  Go to the restroom when you feel like you need to poop. Do not hold it in.  Take over-the-counter and prescription medicines only as told by your doctor. These include any fiber supplements.  Do pelvic floor retraining exercises, such as: ? Doing deep breathing while relaxing your lower belly (abdomen). ? Relaxing your pelvic floor while pooping.  Watch your condition for any changes.  Keep all follow-up visits as told by your doctor. This is important. Contact a doctor if:  You have pain that gets worse.  You have a fever.  You have not pooped for 4 days.  You throw up (vomit).  You are not hungry.  You lose weight.  You are bleeding from the anus.  You have thin, pencil-like poop (stool). Get help right away if:  You have a fever, and your symptoms suddenly get worse.  You leak poop or have blood in your poop.  Your belly feels hard or bigger than normal (is bloated).  You have very bad belly pain.  You feel dizzy or you faint. This information is not intended to replace advice given to you by your health care provider. Make sure you discuss any questions you have with your health care provider. Document Revised: 11/23/2017 Document Reviewed: 05/31/2016 Elsevier Patient Education  [place].    Chronic Back  Pain When back pain lasts longer than 3 months, it is called chronic back pain.The cause of your back pain may not be known. Some common causes include:  Wear and tear (degenerative disease) of the bones, ligaments, or disks in your back.  Inflammation and stiffness in your back (arthritis). People who have chronic back pain often go through certain periods in which the pain is more intense (flare-ups). Many people can learn to manage the pain with home care. Follow these instructions at home: Pay attention to any changes in your symptoms. Take these actions to help with your pain: Activity   Avoid bending and other activities that make the problem worse.  Maintain a proper position when standing or sitting: ? When standing, keep your upper back and neck straight, with your shoulders pulled back. Avoid slouching. ? When sitting, keep your back straight and relax your shoulders. Do not round your shoulders or pull them backward.  Do not sit or stand in one place for long periods of time.  Take brief periods of rest throughout the day. This will reduce your pain. Resting in a lying or standing position is usually better than sitting to rest.  When you are resting for longer periods, mix in some mild activity or stretching between periods of rest. This will help to prevent stiffness and pain.  Get regular exercise. Ask your health care provider what activities are safe for you.  Do not lift  anything that is heavier than 10 lb (4.5 kg). Always use proper lifting technique, which includes: ? Bending your knees. ? Keeping the load close to your body. ? Avoiding twisting.  Sleep on a firm mattress in a comfortable position. Try lying on your side with your knees slightly bent. If you lie on your back, put a pillow under your knees. Managing pain  If directed, apply ice to the painful area. Your health care provider may recommend applying ice during the first 24-48 hours after a flare-up  begins. ? Put ice in a plastic bag. ? Place a towel between your skin and the bag. ? Leave the ice on for 20 minutes, 2-3 times per day.  If directed, apply heat to the affected area as often as told by your health care provider. Use the heat source that your health care provider recommends, such as a moist heat pack or a heating pad. ? Place a towel between your skin and the heat source. ? Leave the heat on for 20-30 minutes. ? Remove the heat if your skin turns bright red. This is especially important if you are unable to feel pain, heat, or cold. You may have a greater risk of getting burned.  Try soaking in a warm tub.  Take over-the-counter and prescription medicines only as told by your health care provider.  Keep all follow-up visits as told by your health care provider. This is important. Contact a health care provider if:  You have pain that is not relieved with rest or medicine. Get help right away if:  You have weakness or numbness in one or both of your legs or feet.  You have trouble controlling your bladder or your bowels.  You have nausea or vomiting.  You have pain in your abdomen.  You have shortness of breath or you faint. This information is not intended to replace advice given to you by your health care provider. Make sure you discuss any questions you have with your health care provider. Document Revised: 04/03/2019 Document Reviewed: 06/20/2017 Elsevier Patient Education  [employer].   If you have lab work done today you will be contacted with your lab results within the next 2 weeks.  If you have not heard from [place] then please contact us. The fastest way to get your results is to register for My Chart.   IF you received an x-ray today, you will receive an invoice from [facility]. Please contact [facility] at [phone] with questions or concerns regarding your invoice.   IF you received labwork today, you will receive an  invoice from [facility]. Please contact [facility] at [phone] with questions or concerns regarding your invoice.   Our billing staff will not be able to assist you with questions regarding bills from these companies.  You will be contacted with the lab results as soon as they are available. The fastest way to get your results is to activate your My Chart account. Instructions are located on the last page of this paperwork. If you have not heard from [place] regarding the results in 2 weeks, please contact this office.

## 2020-12-20 DIAGNOSIS — M5136 Other intervertebral disc degeneration, lumbar region: Secondary | ICD-10-CM | POA: Diagnosis not present

## 2020-12-20 DIAGNOSIS — M2041 Other hammer toe(s) (acquired), right foot: Secondary | ICD-10-CM | POA: Diagnosis not present

## 2020-12-20 DIAGNOSIS — M479 Spondylosis, unspecified: Secondary | ICD-10-CM | POA: Diagnosis not present

## 2020-12-20 DIAGNOSIS — S7012XD Contusion of left thigh, subsequent encounter: Secondary | ICD-10-CM | POA: Diagnosis not present

## 2020-12-20 DIAGNOSIS — M1990 Unspecified osteoarthritis, unspecified site: Secondary | ICD-10-CM | POA: Diagnosis not present

## 2020-12-20 DIAGNOSIS — M810 Age-related osteoporosis without current pathological fracture: Secondary | ICD-10-CM | POA: Diagnosis not present

## 2020-12-21 ENCOUNTER — Other Ambulatory Visit: Payer: Self-pay | Admitting: Family Medicine

## 2020-12-21 DIAGNOSIS — N3000 Acute cystitis without hematuria: Secondary | ICD-10-CM

## 2020-12-21 MED ORDER — SULFAMETHOXAZOLE-TRIMETHOPRIM 800-160 MG PO TABS: 1.0000 | ORAL_TABLET | Freq: Two times a day (BID) | ORAL | 0 refills | Status: AC

## 2020-12-21 NOTE — Progress Notes (Signed)
If you could let [name] know it does look like she has a UTI. I sent Bactrim to her pharmacy for her to take for twice a day for 5 days.

## 2020-12-24 LAB — URINE CULTURE

## 2020-12-27 ENCOUNTER — Telehealth: Payer: Self-pay | Admitting: Family Medicine

## 2020-12-27 ENCOUNTER — Other Ambulatory Visit: Payer: Self-pay | Admitting: Family Medicine

## 2020-12-27 DIAGNOSIS — M5136 Other intervertebral disc degeneration, lumbar region: Secondary | ICD-10-CM

## 2020-12-27 NOTE — Telephone Encounter (Signed)
Pt wants referral to see Dr Ethelene Hal for back trouble would you like an evaluation appt?

## 2020-12-27 NOTE — Telephone Encounter (Signed)
[name] called after hours service to request an referral to see Dr.Ramos for her back trouble    Please advise

## 2020-12-28 NOTE — Telephone Encounter (Signed)
Referral sent 

## 2020-12-29 ENCOUNTER — Telehealth: Payer: Self-pay | Admitting: Family Medicine

## 2020-12-29 ENCOUNTER — Other Ambulatory Visit: Payer: Self-pay | Admitting: Registered Nurse

## 2020-12-29 DIAGNOSIS — M479 Spondylosis, unspecified: Secondary | ICD-10-CM | POA: Diagnosis not present

## 2020-12-29 DIAGNOSIS — S7012XD Contusion of left thigh, subsequent encounter: Secondary | ICD-10-CM | POA: Diagnosis not present

## 2020-12-29 DIAGNOSIS — M810 Age-related osteoporosis without current pathological fracture: Secondary | ICD-10-CM | POA: Diagnosis not present

## 2020-12-29 DIAGNOSIS — M5136 Other intervertebral disc degeneration, lumbar region: Secondary | ICD-10-CM | POA: Diagnosis not present

## 2020-12-29 DIAGNOSIS — M1990 Unspecified osteoarthritis, unspecified site: Secondary | ICD-10-CM | POA: Diagnosis not present

## 2020-12-29 DIAGNOSIS — M2041 Other hammer toe(s) (acquired), right foot: Secondary | ICD-10-CM | POA: Diagnosis not present

## 2020-12-29 NOTE — Telephone Encounter (Signed)
12/29/2020 - I RECEIVED THIS CALL FROM FRANKIE (PHYSICAL THERAPIST) FROM [facility]. SHE NEEDS VERBAL ORDERS TO CONTINUE PHYSICAL THERAPY 1 TIME A WEEK FOR 4 WEEKS. FRANKIE'S PHONE IS: [phone] (PLEASE LEAVE A MESSAGE IF SHE DOES NOT ANSWER) MBC

## 2020-12-30 ENCOUNTER — Telehealth (INDEPENDENT_AMBULATORY_CARE_PROVIDER_SITE_OTHER): Payer: Medicare Other | Admitting: Family Medicine

## 2020-12-30 ENCOUNTER — Encounter: Payer: Self-pay | Admitting: Family Medicine

## 2020-12-30 ENCOUNTER — Other Ambulatory Visit: Payer: Self-pay

## 2020-12-30 DIAGNOSIS — Z1152 Encounter for screening for COVID-19: Secondary | ICD-10-CM

## 2020-12-30 NOTE — Telephone Encounter (Signed)
Please advise patient to continue w/ [facility]. SHE NEEDS VERBAL ORDERS TO CONTINUE PHYSICAL THERAPY 1 TIME A WEEK FOR 4 WEEKS.

## 2020-12-30 NOTE — Patient Instructions (Addendum)
COVID-19 COVID-19 is a respiratory infection that is caused by a virus called severe acute respiratory syndrome coronavirus 2 (SARS-CoV-2). The disease is also known as coronavirus disease or novel coronavirus. In some people, the virus may not cause any symptoms. In others, it may cause a serious infection. The infection can get worse quickly and can lead to complications, such as:  Pneumonia, or infection of the lungs.  Acute respiratory distress syndrome or ARDS. This is a condition in which fluid build-up in the lungs prevents the lungs from filling with air and passing oxygen into the blood.  Acute respiratory failure. This is a condition in which there is not enough oxygen passing from the lungs to the body or when carbon dioxide is not passing from the lungs out of the body.  Sepsis or septic shock. This is a serious bodily reaction to an infection.  Blood clotting problems.  Secondary infections due to bacteria or fungus.  Organ failure. This is when your body's organs stop working. The virus that causes COVID-19 is contagious. This means that it can spread from person to person through droplets from coughs and sneezes (respiratory secretions). What are the causes? This illness is caused by a virus. You may catch the virus by:  Breathing in droplets from an infected person. Droplets can be spread by a person breathing, speaking, singing, coughing, or sneezing.  Touching something, like a table or a doorknob, that was exposed to the virus (contaminated) and then touching your mouth, nose, or eyes. What increases the risk? Risk for infection You are more likely to be infected with this virus if you:  Are within 6 feet (2 meters) of a person with COVID-19.  Provide care for or live with a person who is infected with COVID-19.  Spend time in crowded indoor spaces or live in shared housing. Risk for serious illness You are more likely to become seriously ill from the virus  if you:  Are 71 years of age or older. The higher your age, the more you are at risk for serious illness.  Live in a nursing home or long-term care facility.  Have cancer.  Have a long-term (chronic) disease such as: ? Chronic lung disease, including chronic obstructive pulmonary disease or asthma. ? A long-term disease that lowers your body's ability to fight infection (immunocompromised). ? Heart disease, including heart failure, a condition in which the arteries that lead to the heart become narrow or blocked (coronary artery disease), a disease which makes the heart muscle thick, weak, or stiff (cardiomyopathy). ? Diabetes. ? Chronic kidney disease. ? Sickle cell disease, a condition in which red blood cells have an abnormal "sickle" shape. ? Liver disease.  Are obese. What are the signs or symptoms? Symptoms of this condition can range from mild to severe. Symptoms may appear any time from 2 to 14 days after being exposed to the virus. They include:  A fever or chills.  A cough.  Difficulty breathing.  Headaches, body aches, or muscle aches.  Runny or stuffy (congested) nose.  A sore throat.  New loss of taste or smell. Some people may also have stomach problems, such as nausea, vomiting, or diarrhea. Other people may not have any symptoms of COVID-19. How is this diagnosed? This condition may be diagnosed based on:  Your signs and symptoms, especially if: ? You live in an area with a COVID-19 outbreak. ? You recently traveled to or from an area where the virus is common. ?  You provide care for or live with a person who was diagnosed with COVID-19. ? You were exposed to a person who was diagnosed with COVID-19.  A physical exam.  Lab tests, which may include: ? Taking a sample of fluid from the back of your nose and throat (nasopharyngeal fluid), your nose, or your throat using a swab. ? A sample of mucus from your lungs (sputum). ? Blood tests.  Imaging  tests, which may include, X-rays, CT scan, or ultrasound. How is this treated? At present, there is no medicine to treat COVID-19. Medicines that treat other diseases are being used on a trial basis to see if they are effective against COVID-19. Your health care provider will talk with you about ways to treat your symptoms. For most people, the infection is mild and can be managed at home with rest, fluids, and over-the-counter medicines. Treatment for a serious infection usually takes places in a hospital intensive care unit (ICU). It may include one or more of the following treatments. These treatments are given until your symptoms improve.  Receiving fluids and medicines through an IV.  Supplemental oxygen. Extra oxygen is given through a tube in the nose, a face mask, or a hood.  Positioning you to lie on your stomach (prone position). This makes it easier for oxygen to get into the lungs.  Continuous positive airway pressure (CPAP) or bi-level positive airway pressure (BPAP) machine. This treatment uses mild air pressure to keep the airways open. A tube that is connected to a motor delivers oxygen to the body.  Ventilator. This treatment moves air into and out of the lungs by using a tube that is placed in your windpipe.  Tracheostomy. This is a procedure to create a hole in the neck so that a breathing tube can be inserted.  Extracorporeal membrane oxygenation (ECMO). This procedure gives the lungs a chance to recover by taking over the functions of the heart and lungs. It supplies oxygen to the body and removes carbon dioxide. Follow these instructions at home: Lifestyle  If you are sick, stay home except to get medical care. Your health care provider will tell you how long to stay home. Call your health care provider before you go for medical care.  Rest at home as told by your health care provider.  Do not use any products that contain nicotine or tobacco, such as cigarettes,  e-cigarettes, and chewing tobacco. If you need help quitting, ask your health care provider.  Return to your normal activities as told by your health care provider. Ask your health care provider what activities are safe for you. General instructions  Take over-the-counter and prescription medicines only as told by your health care provider.  Drink enough fluid to keep your urine pale yellow.  Keep all follow-up visits as told by your health care provider. This is important. How is this prevented?  There is no vaccine to help prevent COVID-19 infection. However, there are steps you can take to protect yourself and others from this virus. To protect yourself:   Do not travel to areas where COVID-19 is a risk. The areas where COVID-19 is reported change often. To identify high-risk areas and travel restrictions, check the CDC travel website: FatFares.com.br  If you live in, or must travel to, an area where COVID-19 is a risk, take precautions to avoid infection. ? Stay away from people who are sick. ? Wash your hands often with soap and water for 20 seconds. If soap and  water are not available, use an alcohol-based hand sanitizer. ? Avoid touching your mouth, face, eyes, or nose. ? Avoid going out in public, follow guidance from your state and local health authorities. ? If you must go out in public, wear a cloth face covering or face mask. Make sure your mask covers your nose and mouth. ? Avoid crowded indoor spaces. Stay at least 6 feet (2 meters) away from others. ? Disinfect objects and surfaces that are frequently touched every day. This may include:  Counters and tables.  Doorknobs and light switches.  Sinks and faucets.  Electronics, such as phones, remote controls, keyboards, computers, and tablets. To protect others: If you have symptoms of COVID-19, take steps to prevent the virus from spreading to others.  If you think you have a COVID-19 infection, contact  your health care provider right away. Tell your health care team that you think you may have a COVID-19 infection.  Stay home. Leave your house only to seek medical care. Do not use public transport.  Do not travel while you are sick.  Wash your hands often with soap and water for 20 seconds. If soap and water are not available, use alcohol-based hand sanitizer.  Stay away from other members of your household. Let healthy household members care for children and pets, if possible. If you have to care for children or pets, wash your hands often and wear a mask. If possible, stay in your own room, separate from others. Use a different bathroom.  Make sure that all people in your household wash their hands well and often.  Cough or sneeze into a tissue or your sleeve or elbow. Do not cough or sneeze into your hand or into the air.  Wear a cloth face covering or face mask. Make sure your mask covers your nose and mouth. Where to find more information  Centers for Disease Control and Prevention: www.cdc.gov/coronavirus/2019-ncov/index.html  World Health Organization: www.who.int/health-topics/coronavirus Contact a health care provider if:  You live in or have traveled to an area where COVID-19 is a risk and you have symptoms of the infection.  You have had contact with someone who has COVID-19 and you have symptoms of the infection. Get help right away if:  You have trouble breathing.  You have pain or pressure in your chest.  You have confusion.  You have bluish lips and fingernails.  You have difficulty waking from sleep.  You have symptoms that get worse. These symptoms may represent a serious problem that is an emergency. Do not wait to see if the symptoms will go away. Get medical help right away. Call your local emergency services (911 in the [place] Do not drive yourself to the hospital. Let the emergency medical personnel know if you think you have  COVID-19. Summary  COVID-19 is a respiratory infection that is caused by a virus. It is also known as coronavirus disease or novel coronavirus. It can cause serious infections, such as pneumonia, acute respiratory distress syndrome, acute respiratory failure, or sepsis.  The virus that causes COVID-19 is contagious. This means that it can spread from person to person through droplets from breathing, speaking, singing, coughing, or sneezing.  You are more likely to develop a serious illness if you are 50 years of age or older, have a weak immune system, live in a nursing home, or have chronic disease.  There is no medicine to treat COVID-19. Your health care provider will talk with you about ways to treat your   symptoms.  Take steps to protect yourself and others from infection. Wash your hands often and disinfect objects and surfaces that are frequently touched every day. Stay away from people who are sick and wear a mask if you are sick. This information is not intended to replace advice given to you by your health care provider. Make sure you discuss any questions you have with your health care provider. Document Revised: 10/10/2019 Document Reviewed: 01/16/2019 Elsevier Patient Education  [employer].   If you have lab work done today you will be contacted with your lab results within the next 2 weeks.  If you have not heard from [place] then please contact us. The fastest way to get your results is to register for My Chart.   IF you received an x-ray today, you will receive an invoice from [facility]. Please contact [facility] at [phone] with questions or concerns regarding your invoice.   IF you received labwork today, you will receive an invoice from [facility]. Please contact [facility] at [phone] with questions or concerns regarding your invoice.   Our billing staff will not be able to assist you with questions regarding bills from these companies.  You  will be contacted with the lab results as soon as they are available. The fastest way to get your results is to activate your My Chart account. Instructions are located on the last page of this paperwork. If you have not heard from [place] regarding the results in 2 weeks, please contact this office.

## 2020-12-30 NOTE — Telephone Encounter (Signed)
Please advise patient ok to continue with [facility]. SHE NEEDS VERBAL ORDERS TO CONTINUE PHYSICAL THERAPY 1 TIME A WEEK FOR 4 WEEKS.

## 2020-12-30 NOTE — Progress Notes (Signed)
Virtual Visit Note  I connected with patient on 12/30/20 at 1033 by telephone due to unable to work Epic video visit and verified that I am speaking with the correct person using two identifiers. [name] is currently located at home and no family members are currently with them during visit. The provider, Laurita Quint Dang Mathison, FNP is located in their office at time of visit.  I discussed the limitations, risks, security and privacy concerns of performing an evaluation and management service by telephone and the availability of in person appointments. I also discussed with the patient that there may be a patient responsible charge related to this service. The patient expressed understanding and agreed to proceed.   I provided 20 minutes of non-face-to-face time during this encounter.  Chief Complaint  Patient presents with  . Covid Exposure    Requesting testing    HPI ? 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 Authorizing Provider  acetaminophen (TYLENOL 8 HOUR) 650 MG CR tablet Take 1 tablet (650 mg total) by mouth 2 (two) times daily as needed for pain. 12/11/20  Yes Petrucelli, Samantha R, PA-C  Calcium Carbonate-Vitamin D (CALCIUM-VITAMIN D) 500-200 MG-UNIT tablet Take 3 tablets by mouth every morning.   Yes [provider]  calcium-vitamin D (OSCAL WITH D) 500-200 MG-UNIT TABS tablet calcium carbonate 500 mg (1,250 mg)-vitamin D3 200 unit tablet   3 tbls by oral route.   Yes [provider]  diclofenac Sodium (VOLTAREN) 1 % GEL Apply 2 g topically 4 (four) times daily. 12/15/20  Yes Delainey Winstanley, Laurita Quint, FNP  Elastic Bandages & Supports (LUMBAR BACK BRACE/SUPPORT PAD)  MISC Dispense one back brace to be worn while doing activities as needed daily 11/03/19  Yes Stallings, Zoe A, MD  ESTRACE VAGINAL 0.1 MG/GM vaginal cream INSERT 1 APPLICATORFUL VAGINALLY ONCE A WEEK 07/15/19  Yes Stallings, Zoe A, MD  FEROSUL 325 (65 Fe) MG tablet TAKE 1 TABLET(325 MG) BY MOUTH DAILY 01/12/20  Yes Delia Chimes A, MD  gabapentin (NEURONTIN) 800 MG tablet TAKE 1 TABLET(800 MG) BY MOUTH AT BEDTIME 12/27/20  Yes Wendie Agreste, MD  hydrochlorothiazide (MICROZIDE) 12.5 MG capsule TAKE 1 CAPSULE 3 TIMES A WEEK 12/29/20  Yes Wendie Agreste, MD  hydrocortisone 2.5 % ointment APPLY EXTERNALLY TO THE AFFECTED AREA TWICE DAILY 05/31/18  Yes Wardell Honour, MD  hydroxypropyl methylcellulose (ISOPTO TEARS) 2.5 % ophthalmic solution Place 1 drop into both eyes 2 (two) times daily as needed for dry eyes.    Yes [provider]  lactose free nutrition (BOOST) LIQD Take 237 mLs by mouth 2 (two) times daily between meals. Patient taking differently: Take 1 Container by mouth 2 (two) times daily as needed (nutritional). 03/22/16  Yes [name], MD  latanoprost (XALATAN) 0.005 % ophthalmic solution INT 1 GTT IN OD QD 08/26/19  Yes [provider]  lidocaine (LIDODERM) 5 % Place 1 patch onto the skin daily as needed. Apply patch to area most significant pain once per day.  Remove and discard patch within 12 hours of application. 12/15/20  Yes Khaliyah Northrop, Laurita Quint, FNP  meloxicam (MOBIC) 7.5 MG tablet Take 1 tablet (7.5 mg total)  by mouth daily. 12/15/20  Yes Julianna Vanwagner, Azalee Course, FNP  metoprolol succinate (TOPROL-XL) 50 MG 24 hr tablet TAKE 1 TABLET BY MOUTH EVERY DAY WITH OR IMMEDIATELY FOLLOWING A MEAL 03/03/20  Yes Doristine Bosworth, MD  NON FORMULARY    Yes [provider]  nystatin cream (MYCOSTATIN) Apply to external affected area twice a day for 10 days as needed. 11/08/19  Yes Stallings, Zoe A, MD  ofloxacin (OCUFLOX) 0.3 % ophthalmic solution 10 drops in right ear once daily for 5  days. 01/29/19  Yes Yu, Amy V, PA-C  pantoprazole (PROTONIX) 40 MG tablet TAKE 1 TABLET(40 MG) BY MOUTH TWICE DAILY 12/15/20  Yes Kinlee Garrison, Azalee Course, FNP  polyethylene glycol powder (GLYCOLAX/MIRALAX) 17 GM/SCOOP powder Take 17 g by mouth daily. 12/15/20  Yes Zerrick Hanssen, Azalee Course, FNP  PREMARIN vaginal cream  10/10/19  Yes [provider]  promethazine (PHENERGAN) 12.5 MG tablet take 1 tablet by mouth every 6 hours if needed for nausea and vomiting 11/03/19  Yes Stallings, Zoe A, MD  sucralfate (CARAFATE) 1 g tablet Take 1 tablet (1 g total) by mouth every 6 (six) hours as needed. Slowly dissolve tablet in 1 Tablespoon of distilled water for 15 minutes before taking (do not crush) 07/15/19  Yes Armbruster, Willaim Rayas, MD  tiZANidine (ZANAFLEX) 4 MG tablet Take 1 tablet (4 mg total) by mouth every 6 (six) hours as needed for muscle spasms. 08/29/20  Yes Bast, Traci A, NP  traMADol-acetaminophen (ULTRACET) 37.5-325 MG tablet Take 1 tablet by mouth every 6 (six) hours. 12/15/20  Yes Kilah Drahos, Azalee Course, FNP  triamcinolone cream (KENALOG) 0.1 % Apply topically 2 (two) times daily. 09/04/18  Yes Stallings, Zoe A, MD  verapamil (CALAN-SR) 240 MG CR tablet Take 1 tablet (240 mg total) by mouth daily. 11/03/19  Yes Doristine Bosworth, MD  vitamin C (ASCORBIC ACID) 500 MG tablet Take 500 mg by mouth daily.   Yes [provider]  ZINC OXIDE, TOPICAL, 10 % CREA 1 application to affected area to prevent moisture and further irritation of skin. 01/29/19  Yes Belinda Fisher, PA-C    Past Medical History:  Diagnosis Date  . Abnormal finding on Pap smear, ASCUS   . Acute renal insufficiency   . Amputation finger    index  . Barrett's esophagus   . CAD (coronary artery disease)   . Dementia (HCC)   . Diverticulosis   . Elevated BP   . Foot deformity   . GERD (gastroesophageal reflux disease)   . Glaucoma   . H/O: GI bleed   . HSV-2 (herpes simplex virus 2) infection   . Hyperglycemia   . Hypertension   . Migraine    . Osteoporosis    worsening  . Spondylosis     Past Surgical History:  Procedure Laterality Date  . AMPUTATION FINGER / THUMB     secondary to osteomyelitis  . CATARACT EXTRACTION, BILATERAL Bilateral 12/25/2016  . COLONOSCOPY    . ESOPHAGOGASTRODUODENOSCOPY N/A 10/22/2016   Procedure: ESOPHAGOGASTRODUODENOSCOPY (EGD);  Surgeon: Sherrilyn Rist, MD;  Location: [facility] ENDOSCOPY;  Service: Endoscopy;  Laterality: N/A;  . FOOT SURGERY     right  . PARTIAL HYSTERECTOMY    . UPPER GASTROINTESTINAL ENDOSCOPY      Social History   Tobacco Use  . Smoking status: Never Smoker  . Smokeless tobacco: Never Used  Substance Use Topics  . Alcohol use: No    Alcohol/week: 0.0 standard drinks  Family History  Problem Relation Age of Onset  . Breast cancer Mother   . Heart attack Mother   . Heart disease Father   . Alcohol abuse Son   . Colon cancer Neg Hx   . Stomach cancer Neg Hx   . Rectal cancer Neg Hx     Review of Systems  Constitutional: Positive for malaise/fatigue. Negative for chills and fever.  Eyes: Negative for blurred vision and double vision.  Respiratory: Negative for cough, shortness of breath and wheezing.   Cardiovascular: Negative for chest pain, palpitations and leg swelling.  Gastrointestinal: Positive for nausea. Negative for abdominal pain, blood in stool, constipation, diarrhea, heartburn and vomiting.  Genitourinary: Negative for dysuria, frequency and hematuria.  Musculoskeletal: Positive for back pain. Negative for joint pain.  Skin: Negative for rash.  Neurological: Negative for dizziness, weakness and headaches.    Objective  Constitutional:      General: Not in acute distress.    Appearance: Normal appearance. Not ill-appearing.   Pulmonary:     Effort: Pulmonary effort is normal. No respiratory distress.  Neurological:     Mental Status: Alert and oriented to person, place, and time.  Psychiatric:        Mood and Affect: Mood normal.         Behavior: Behavior normal.     ASSESSMENT and PLAN  Problem List Items Addressed This Visit   None   Visit Diagnoses    Encounter for screening for COVID-19    -  Primary   Relevant Orders   COVID-19, Flu A+B and RSV     Plan Encouraged to complete antibiotics for UTI Discussed conservative treatment, course of illness, and answered all questions   Return if symptoms worsen or fail to improve, for next scheduled appointment.    The above assessment and management plan was discussed with the patient. The patient verbalized understanding of and has agreed to the management plan. Patient is aware to call the clinic if symptoms persist or worsen. Patient is aware when to return to the clinic for a follow-up visit. Patient educated on when it is appropriate to go to the emergency department.     Huston Foley Nathaneil Feagans, FNP-BC Primary Care at [address] [facility] [place], [place] [address] Ph.  [phone] Fax [phone]

## 2021-01-02 LAB — COVID-19, FLU A+B AND RSV
Influenza A, NAA: NOT DETECTED
Influenza B, NAA: NOT DETECTED
RSV, NAA: NOT DETECTED
SARS-CoV-2, NAA: NOT DETECTED

## 2021-01-03 ENCOUNTER — Telehealth: Payer: Self-pay | Admitting: Family Medicine

## 2021-01-03 DIAGNOSIS — F0390 Unspecified dementia without behavioral disturbance: Secondary | ICD-10-CM | POA: Diagnosis not present

## 2021-01-03 DIAGNOSIS — K2270 Barrett's esophagus without dysplasia: Secondary | ICD-10-CM | POA: Diagnosis not present

## 2021-01-03 DIAGNOSIS — K259 Gastric ulcer, unspecified as acute or chronic, without hemorrhage or perforation: Secondary | ICD-10-CM | POA: Diagnosis not present

## 2021-01-03 DIAGNOSIS — D62 Acute posthemorrhagic anemia: Secondary | ICD-10-CM | POA: Diagnosis not present

## 2021-01-03 DIAGNOSIS — Z9181 History of falling: Secondary | ICD-10-CM | POA: Diagnosis not present

## 2021-01-03 DIAGNOSIS — M2041 Other hammer toe(s) (acquired), right foot: Secondary | ICD-10-CM | POA: Diagnosis not present

## 2021-01-03 DIAGNOSIS — M1990 Unspecified osteoarthritis, unspecified site: Secondary | ICD-10-CM | POA: Diagnosis not present

## 2021-01-03 DIAGNOSIS — H409 Unspecified glaucoma: Secondary | ICD-10-CM | POA: Diagnosis not present

## 2021-01-03 DIAGNOSIS — I2510 Atherosclerotic heart disease of native coronary artery without angina pectoris: Secondary | ICD-10-CM | POA: Diagnosis not present

## 2021-01-03 DIAGNOSIS — Z90710 Acquired absence of both cervix and uterus: Secondary | ICD-10-CM | POA: Diagnosis not present

## 2021-01-03 DIAGNOSIS — G43909 Migraine, unspecified, not intractable, without status migrainosus: Secondary | ICD-10-CM | POA: Diagnosis not present

## 2021-01-03 DIAGNOSIS — K449 Diaphragmatic hernia without obstruction or gangrene: Secondary | ICD-10-CM | POA: Diagnosis not present

## 2021-01-03 DIAGNOSIS — K219 Gastro-esophageal reflux disease without esophagitis: Secondary | ICD-10-CM | POA: Diagnosis not present

## 2021-01-03 DIAGNOSIS — K5790 Diverticulosis of intestine, part unspecified, without perforation or abscess without bleeding: Secondary | ICD-10-CM | POA: Diagnosis not present

## 2021-01-03 DIAGNOSIS — I10 Essential (primary) hypertension: Secondary | ICD-10-CM | POA: Diagnosis not present

## 2021-01-03 DIAGNOSIS — Z79891 Long term (current) use of opiate analgesic: Secondary | ICD-10-CM | POA: Diagnosis not present

## 2021-01-03 DIAGNOSIS — R296 Repeated falls: Secondary | ICD-10-CM | POA: Diagnosis not present

## 2021-01-03 DIAGNOSIS — G4730 Sleep apnea, unspecified: Secondary | ICD-10-CM | POA: Diagnosis not present

## 2021-01-03 DIAGNOSIS — Z79899 Other long term (current) drug therapy: Secondary | ICD-10-CM | POA: Diagnosis not present

## 2021-01-03 DIAGNOSIS — M5136 Other intervertebral disc degeneration, lumbar region: Secondary | ICD-10-CM | POA: Diagnosis not present

## 2021-01-03 DIAGNOSIS — B009 Herpesviral infection, unspecified: Secondary | ICD-10-CM | POA: Diagnosis not present

## 2021-01-03 DIAGNOSIS — M479 Spondylosis, unspecified: Secondary | ICD-10-CM | POA: Diagnosis not present

## 2021-01-03 DIAGNOSIS — M810 Age-related osteoporosis without current pathological fracture: Secondary | ICD-10-CM | POA: Diagnosis not present

## 2021-01-03 DIAGNOSIS — Z8619 Personal history of other infectious and parasitic diseases: Secondary | ICD-10-CM | POA: Diagnosis not present

## 2021-01-03 NOTE — Progress Notes (Signed)
IF you could let her know her covid is negative.

## 2021-01-03 NOTE — Telephone Encounter (Signed)
LVM with verbal orders for continued PT orders.

## 2021-01-03 NOTE — Telephone Encounter (Signed)
  Orders to be signed by provider came via fax  From [place] paperwork in Provider mail bos / Provider lounge for signature

## 2021-01-11 DIAGNOSIS — M479 Spondylosis, unspecified: Secondary | ICD-10-CM | POA: Diagnosis not present

## 2021-01-11 DIAGNOSIS — M5136 Other intervertebral disc degeneration, lumbar region: Secondary | ICD-10-CM | POA: Diagnosis not present

## 2021-01-11 DIAGNOSIS — M2041 Other hammer toe(s) (acquired), right foot: Secondary | ICD-10-CM | POA: Diagnosis not present

## 2021-01-11 DIAGNOSIS — I2510 Atherosclerotic heart disease of native coronary artery without angina pectoris: Secondary | ICD-10-CM | POA: Diagnosis not present

## 2021-01-11 DIAGNOSIS — M1990 Unspecified osteoarthritis, unspecified site: Secondary | ICD-10-CM | POA: Diagnosis not present

## 2021-01-11 DIAGNOSIS — M810 Age-related osteoporosis without current pathological fracture: Secondary | ICD-10-CM | POA: Diagnosis not present

## 2021-01-12 ENCOUNTER — Other Ambulatory Visit: Payer: Self-pay | Admitting: Family Medicine

## 2021-01-12 NOTE — Telephone Encounter (Signed)
Requested medication (s) are due for refill today - yes  Requested medication (s) are on the active medication list -yes  Future visit scheduled -yes  Last refill: 10/04/20  Notes to clinic: Request non delegated rx  Requested Prescriptions  Pending Prescriptions Disp Refills   promethazine (PHENERGAN) 12.5 MG tablet [Pharmacy Med Name: PROMETHAZINE 12.5MG  TABLETS] 30 tablet 6    Sig: TAKE 1 TABLET BY MOUTH EVERY 6 HOURS AS NEEDED FOR NAUSEA OR VOMITING      Not Delegated - Gastroenterology: Antiemetics Failed - 01/12/2021  1:38 PM      Failed - This refill cannot be delegated      Passed - Valid encounter within last 6 months    Recent Outpatient Visits           1 week ago Encounter for screening for COVID-19   [facility], Laurita Quint, FNP   4 weeks ago Vaginal dryness   [facility], Laurita Quint, FNP   1 month ago Right foot pain   [facility], Ranell Patrick, MD   2 months ago History of fall   [facility], Ranell Patrick, MD   2 months ago    [facility], Ranell Patrick, MD       Future Appointments             In 2 months Carlota Raspberry Ranell Patrick, MD [facility] at [place], [facility]                 Requested Prescriptions  Pending Prescriptions Disp Refills   promethazine (PHENERGAN) 12.5 MG tablet [Pharmacy Med Name: PROMETHAZINE 12.5MG  TABLETS] 30 tablet 6    Sig: TAKE 1 TABLET BY MOUTH EVERY 6 HOURS AS NEEDED FOR NAUSEA OR VOMITING      Not Delegated - Gastroenterology: Antiemetics Failed - 01/12/2021  1:38 PM      Failed - This refill cannot be delegated      Passed - Valid encounter within last 6 months    Recent Outpatient Visits           1 week ago Encounter for screening for COVID-19   [facility], Laurita Quint, FNP   4 weeks ago Vaginal dryness   [facility] [place] [facility] [facility], Laurita Quint, FNP   1 month ago Right foot pain   [facility] [place] [facility], Ranell Patrick, MD   2  months ago History of fall   [facility], Ranell Patrick, MD   2 months ago    [facility] [place], Ranell Patrick, MD       Future Appointments             In 2 months Carlota Raspberry Ranell Patrick, MD [facility] at [place], [facility]

## 2021-01-17 DIAGNOSIS — M2041 Other hammer toe(s) (acquired), right foot: Secondary | ICD-10-CM | POA: Diagnosis not present

## 2021-01-17 DIAGNOSIS — M1990 Unspecified osteoarthritis, unspecified site: Secondary | ICD-10-CM | POA: Diagnosis not present

## 2021-01-17 DIAGNOSIS — M5136 Other intervertebral disc degeneration, lumbar region: Secondary | ICD-10-CM | POA: Diagnosis not present

## 2021-01-17 DIAGNOSIS — I2510 Atherosclerotic heart disease of native coronary artery without angina pectoris: Secondary | ICD-10-CM | POA: Diagnosis not present

## 2021-01-17 DIAGNOSIS — M479 Spondylosis, unspecified: Secondary | ICD-10-CM | POA: Diagnosis not present

## 2021-01-17 DIAGNOSIS — M810 Age-related osteoporosis without current pathological fracture: Secondary | ICD-10-CM | POA: Diagnosis not present

## 2021-01-18 ENCOUNTER — Other Ambulatory Visit: Payer: Self-pay | Admitting: Family Medicine

## 2021-01-18 DIAGNOSIS — M5136 Other intervertebral disc degeneration, lumbar region: Secondary | ICD-10-CM

## 2021-01-25 ENCOUNTER — Telehealth: Payer: Self-pay | Admitting: Family Medicine

## 2021-01-25 DIAGNOSIS — I2510 Atherosclerotic heart disease of native coronary artery without angina pectoris: Secondary | ICD-10-CM | POA: Diagnosis not present

## 2021-01-25 DIAGNOSIS — M479 Spondylosis, unspecified: Secondary | ICD-10-CM | POA: Diagnosis not present

## 2021-01-25 DIAGNOSIS — M2041 Other hammer toe(s) (acquired), right foot: Secondary | ICD-10-CM | POA: Diagnosis not present

## 2021-01-25 DIAGNOSIS — M1990 Unspecified osteoarthritis, unspecified site: Secondary | ICD-10-CM | POA: Diagnosis not present

## 2021-01-25 DIAGNOSIS — M5136 Other intervertebral disc degeneration, lumbar region: Secondary | ICD-10-CM | POA: Diagnosis not present

## 2021-01-25 DIAGNOSIS — M810 Age-related osteoporosis without current pathological fracture: Secondary | ICD-10-CM | POA: Diagnosis not present

## 2021-01-25 MED ORDER — ONDANSETRON 4 MG PO TBDP: 4.0000 mg | ORAL_TABLET | Freq: Three times a day (TID) | ORAL | 2 refills | Status: DC | PRN

## 2021-01-25 NOTE — Telephone Encounter (Signed)
zofran rx sent.  If this is not covered we will need to discuss her options.  Advise her that this medicine can cause worsening constipation, so use only as needed and watch for that side effect.  Let me know if there are questions.

## 2021-01-25 NOTE — Telephone Encounter (Signed)
Pt okay with Zofran

## 2021-01-25 NOTE — Telephone Encounter (Signed)
Pt reports she uses infrequently but will watch for constipation

## 2021-01-25 NOTE — Telephone Encounter (Signed)
Promethazine was denied by her insurance.  Option of out-of-pocket purchase or if she is not requiring that medicine, no additional prescription needed.  However if she does require something for nausea I do see where she has been prescribed Zofran in the past.  We could send that in to see if it is covered.  Let me know what she would like to do.

## 2021-02-02 ENCOUNTER — Telehealth: Payer: Self-pay | Admitting: Family Medicine

## 2021-02-02 NOTE — Telephone Encounter (Signed)
Received call earlier from patient's daughter requesting medical records containing proof of patient having issues with memory. Daughter stated records needed for law suit. Advised patient's daughter to bring patient in to fill out papers for medical release and to specify what the documentation needs to prove. Patient's daughter stated she will come in today with the patient to complete the paperwork.

## 2021-02-02 NOTE — Telephone Encounter (Signed)
Patient came in with her [place] niece [name] ) to fill out  Medical records release form . Patient is looking  For any signs Cognitive issues . Patient might have been taking advantage of , patient is retaining an  attorney to try to  Make sure she wasn't taken advantage of .   Any questions please reach out Patient or  Niece [name] states she is power of atty [phone]

## 2021-02-04 DIAGNOSIS — M7918 Myalgia, other site: Secondary | ICD-10-CM | POA: Diagnosis not present

## 2021-02-07 ENCOUNTER — Other Ambulatory Visit: Payer: Self-pay

## 2021-02-07 ENCOUNTER — Other Ambulatory Visit: Payer: Self-pay | Admitting: Family Medicine

## 2021-02-07 DIAGNOSIS — M5136 Other intervertebral disc degeneration, lumbar region: Secondary | ICD-10-CM

## 2021-02-07 NOTE — Telephone Encounter (Signed)
Requested medication (s) are due for refill today: yes  Requested medication (s) are on the active medication list: yes  Last refill: 12/15/2020  Future visit scheduled: yes  Notes to clinic:  this refill cannot be delegated    Requested Prescriptions  Pending Prescriptions Disp Refills   traMADol-acetaminophen (ULTRACET) 37.5-325 MG tablet [Pharmacy Med Name: TRAMADOL/APAP 37.5MG /325MG  TABS] 60 tablet     Sig: TAKE 1 TABLET BY MOUTH EVERY 6 HOURS      Not Delegated - Analgesics:  Opioid Agonist Combinations Failed - 02/07/2021  5:03 PM      Failed - This refill cannot be delegated      Failed - Urine Drug Screen completed in last 360 days      Passed - Valid encounter within last 6 months    Recent Outpatient Visits           1 month ago Encounter for screening for COVID-19   [facility], Laurita Quint, FNP   1 month ago Vaginal dryness   [facility], Laurita Quint, FNP   2 months ago Right foot pain   [facility], Ranell Patrick, MD   3 months ago History of fall   [facility], Ranell Patrick, MD   3 months ago    [facility], Ranell Patrick, MD       Future Appointments             In 2 months Carlota Raspberry Ranell Patrick, MD [facility] at [place], [facility]

## 2021-02-07 NOTE — Telephone Encounter (Signed)
Patient is requesting a refill of the following medications: Requested Prescriptions   Pending Prescriptions Disp Refills  . traMADol-acetaminophen (ULTRACET) 37.5-325 MG tablet 60 tablet 0    Sig: Take 1 tablet by mouth every 6 (six) hours.    Date of patient request: 02/05/2021 Last office visit: 12/30/2020 Date of last refill: 12/15/2020 Last refill amount: 60 Follow up time period per chart: return if sxs fail to improve   CRM: Leonore also noted had an injection in her back on Friday and has had some dizzy spells since then, did not have this when getting same injection in September. Asking for advice. Should she call their office instead?

## 2021-02-08 MED ORDER — TRAMADOL-ACETAMINOPHEN 37.5-325 MG PO TABS: 1.0000 | ORAL_TABLET | Freq: Four times a day (QID) | ORAL | 0 refills | Status: DC

## 2021-02-08 NOTE — Telephone Encounter (Signed)
Patient is requesting a refill of the following medications: Requested Prescriptions   Pending Prescriptions Disp Refills   traMADol-acetaminophen (ULTRACET) 37.5-325 MG tablet [Pharmacy Med Name: TRAMADOL/APAP 37.5MG /325MG  TABS] 60 tablet     Sig: TAKE 1 TABLET BY MOUTH EVERY 6 HOURS    Date of patient request: 02/07/21 Last office visit: 12/30/20 Date of last refill: 12/15/20 Last refill amount: 60 Follow up time period per chart: 04/11/21

## 2021-02-08 NOTE — Telephone Encounter (Signed)
Answering service call was received on February 12th. Reported some blurry vision/dizziness after her injection that had been persistent since injection.   Advised ER/urgent care visit.  Please check status.  Controlled substance database reviewed, last tramadol/acetaminophen prescription filled December 22 for #60.  Refill ordered.

## 2021-02-10 NOTE — Telephone Encounter (Signed)
I spoke with Malachy Mood and let her know-PCP sent Desia's request for her medical records from Satira Sark attorney at law to [employer] on 02/04/21. They are wanting 12 years back, so [employer] will do their part and I will send her records once I get them from [place].

## 2021-03-03 ENCOUNTER — Telehealth: Payer: Self-pay | Admitting: Family Medicine

## 2021-03-03 DIAGNOSIS — D539 Nutritional anemia, unspecified: Secondary | ICD-10-CM

## 2021-03-03 DIAGNOSIS — M5136 Other intervertebral disc degeneration, lumbar region: Secondary | ICD-10-CM

## 2021-03-03 MED ORDER — OFLOXACIN 0.3 % OP SOLN: OPHTHALMIC | 0 refills | Status: DC

## 2021-03-03 MED ORDER — METOPROLOL SUCCINATE ER 50 MG PO TB24: ORAL_TABLET | ORAL | 1 refills | Status: DC

## 2021-03-03 MED ORDER — VERAPAMIL HCL ER 240 MG PO TBCR: 240.0000 mg | EXTENDED_RELEASE_TABLET | Freq: Every day | ORAL | 3 refills | Status: DC

## 2021-03-03 MED ORDER — ONDANSETRON 4 MG PO TBDP: 4.0000 mg | ORAL_TABLET | Freq: Three times a day (TID) | ORAL | 2 refills | Status: DC | PRN

## 2021-03-03 MED ORDER — FERROUS SULFATE 325 (65 FE) MG PO TABS: ORAL_TABLET | ORAL | 0 refills | Status: DC

## 2021-03-03 MED ORDER — ACETAMINOPHEN ER 650 MG PO TBCR: 650.0000 mg | EXTENDED_RELEASE_TABLET | Freq: Two times a day (BID) | ORAL | 0 refills | Status: DC | PRN

## 2021-03-03 MED ORDER — HYDROCHLOROTHIAZIDE 12.5 MG PO CAPS: ORAL_CAPSULE | ORAL | 3 refills | Status: DC

## 2021-03-03 NOTE — Telephone Encounter (Signed)
This is a patient of Dr. Carlota Raspberry.

## 2021-03-03 NOTE — Telephone Encounter (Signed)
Controlled substance database (PDMP) reviewed.  Filled Ultracet on 02/08/2021 for #60.  My understanding is that she was taking that once per day for chronic back pain.  Has she been taking it differently?  Should not need refill yet.  Please clarify

## 2021-03-03 NOTE — Telephone Encounter (Signed)
Please schedule patient for med review and refills. No refills without office visit

## 2021-03-03 NOTE — Telephone Encounter (Signed)
Patient is requesting a refill of the following medications: Requested Prescriptions   Pending Prescriptions Disp Refills   traMADol-acetaminophen (ULTRACET) 37.5-325 MG tablet 60 tablet 0    Sig: Take 1 tablet by mouth every 6 (six) hours.   Signed Prescriptions Disp Refills   ondansetron (ZOFRAN ODT) 4 MG disintegrating tablet 30 tablet 2    Sig: Take 1 tablet (4 mg total) by mouth every 8 (eight) hours as needed for nausea or vomiting.    Authorizing Provider: Carlota Raspberry, JEFFREY R    Ordering User: Norton Blizzard R   verapamil (CALAN-SR) 240 MG CR tablet 90 tablet 3    Sig: Take 1 tablet (240 mg total) by mouth daily.    Authorizing Provider: Carlota Raspberry, JEFFREY R    Ordering User: Norton Blizzard R   hydrochlorothiazide (MICROZIDE) 12.5 MG capsule 90 capsule 3    Sig: TAKE 1 CAPSULE 3 TIMES A WEEK    Authorizing Provider: Carlota Raspberry, JEFFREY R    Ordering User: Baltazar Najjar, Idella Lamontagne R   metoprolol succinate (TOPROL-XL) 50 MG 24 hr tablet 90 tablet 1    Sig: TAKE 1 TABLET BY MOUTH EVERY DAY WITH OR IMMEDIATELY FOLLOWING A MEAL    Authorizing Provider: Carlota Raspberry, JEFFREY R    Ordering User: Baltazar Najjar, Justus Droke R   ofloxacin (OCUFLOX) 0.3 % ophthalmic solution 5 mL 0    Sig: 10 drops in right ear once daily for 5 days.    Authorizing Provider: Carlota Raspberry, JEFFREY R    Ordering User: Norton Blizzard R   acetaminophen (TYLENOL 8 HOUR) 650 MG CR tablet 15 tablet 0    Sig: Take 1 tablet (650 mg total) by mouth 2 (two) times daily as needed for pain.    Authorizing Provider: Carlota Raspberry, JEFFREY R    Ordering User: Norton Blizzard R   ferrous sulfate (FEROSUL) 325 (65 FE) MG tablet 30 tablet 0    Sig: TAKE 1 TABLET(325 MG) BY MOUTH DAILY    Authorizing Provider: Wendie Agreste    Ordering User: Teresita Madura    Date of patient request: 03/03/21 Last office visit: 10/29/20 Date of last refill: 02/08/21 Last refill amount: 30 Follow up time period per chart:

## 2021-03-03 NOTE — Telephone Encounter (Signed)
Notes to clinic: Patient requesting 90 day for all medication  Since practice is closing    Requested Prescriptions  Pending Prescriptions Disp Refills   ondansetron (ZOFRAN ODT) 4 MG disintegrating tablet 30 tablet 2    Sig: Take 1 tablet (4 mg total) by mouth every 8 (eight) hours as needed for nausea or vomiting.      Not Delegated - Gastroenterology: Antiemetics Failed - 03/03/2021 10:49 AM      Failed - This refill cannot be delegated      Passed - Valid encounter within last 6 months    Recent Outpatient Visits           2 months ago Encounter for screening for COVID-19   [facility], Laurita Quint, FNP   2 months ago Vaginal dryness   [facility], Laurita Quint, FNP   3 months ago Right foot pain   [facility], Ranell Patrick, MD   4 months ago History of fall   [facility], Ranell Patrick, MD   4 months ago    [facility], Ranell Patrick, MD       Future Appointments             In 2 months Janith Lima, MD [place] at [facility]               verapamil (CALAN-SR) 240 MG CR tablet 90 tablet 3    Sig: Take 1 tablet (240 mg total) by mouth daily.      Cardiovascular:  Calcium Channel Blockers Failed - 03/03/2021 10:49 AM      Failed - Last BP in normal range    BP Readings from Last 1 Encounters:  12/15/20 (!) 156/76          Passed - Valid encounter within last 6 months    Recent Outpatient Visits           2 months ago Encounter for screening for COVID-19   [facility], Laurita Quint, FNP   2 months ago Vaginal dryness   [facility], Laurita Quint, FNP   3 months ago Right foot pain   [facility], Ranell Patrick, MD   4 months ago History of fall   [facility], Ranell Patrick, MD   4 months ago    [facility], Ranell Patrick, MD       Future Appointments             In 2 months Janith Lima, MD  [place] at [facility]               hydrochlorothiazide (MICROZIDE) 12.5 MG capsule 90 capsule 3    Sig: TAKE 1 CAPSULE 3 TIMES A WEEK      Cardiovascular: Diuretics - Thiazide Failed - 03/03/2021 10:49 AM      Failed - Last BP in normal range    BP Readings from Last 1 Encounters:  12/15/20 (!) 156/76          Passed - Ca in normal range and within 360 days    Calcium  Date Value Ref Range Status  09/10/2020 9.2 8.7 - 10.3 mg/dL Final   Calcium, Ion  Date Value Ref Range Status  06/15/2010 1.16 1.12 - 1.32 mmol/L Final          Passed -  Cr in normal range and within 360 days    Creat  Date Value Ref Range Status  11/04/2016 0.96 (H) 0.60 - 0.88 mg/dL Final    Comment:      For patients > or = 85 years of age: The upper reference limit for Creatinine is approximately 13% higher for people identified as African-American.      Creatinine, Ser  Date Value Ref Range Status  09/10/2020 0.94 0.57 - 1.00 mg/dL Final          Passed - K in normal range and within 360 days    Potassium  Date Value Ref Range Status  09/10/2020 4.4 3.5 - 5.2 mmol/L Final          Passed - Na in normal range and within 360 days    Sodium  Date Value Ref Range Status  09/10/2020 142 134 - 144 mmol/L Final          Passed - Valid encounter within last 6 months    Recent Outpatient Visits           2 months ago Encounter for screening for COVID-19   [facility], Laurita Quint, FNP   2 months ago Vaginal dryness   [facility], Laurita Quint, FNP   3 months ago Right foot pain   [facility], Ranell Patrick, MD   4 months ago History of fall   [facility], Ranell Patrick, MD   4 months ago    [facility], Ranell Patrick, MD       Future Appointments             In 2 months Janith Lima, MD [place] at [facility]               metoprolol succinate (TOPROL-XL) 50 MG 24 hr  tablet 90 tablet 1    Sig: TAKE 1 TABLET BY MOUTH EVERY DAY WITH OR IMMEDIATELY FOLLOWING A MEAL      Cardiovascular:  Beta Blockers Failed - 03/03/2021 10:49 AM      Failed - Last BP in normal range    BP Readings from Last 1 Encounters:  12/15/20 (!) 156/76          Passed - Last Heart Rate in normal range    Pulse Readings from Last 1 Encounters:  12/15/20 88          Passed - Valid encounter within last 6 months    Recent Outpatient Visits           2 months ago Encounter for screening for COVID-19   [facility], Laurita Quint, FNP   2 months ago Vaginal dryness   [facility], Laurita Quint, FNP   3 months ago Right foot pain   [facility], Ranell Patrick, MD   4 months ago History of fall   [facility], Ranell Patrick, MD   4 months ago    [facility], Ranell Patrick, MD       Future Appointments             In 2 months Janith Lima, MD [place] at [facility]               traMADol-acetaminophen (ULTRACET) 37.5-325 MG tablet 60 tablet 0    Sig: Take 1 tablet by  mouth every 6 (six) hours.      Not Delegated - Analgesics:  Opioid Agonist Combinations Failed - 03/03/2021 10:49 AM      Failed - This refill cannot be delegated      Failed - Urine Drug Screen completed in last 360 days      Passed - Valid encounter within last 6 months    Recent Outpatient Visits           2 months ago Encounter for screening for COVID-19   [facility], Laurita Quint, FNP   2 months ago Vaginal dryness   [facility], Laurita Quint, FNP   3 months ago Right foot pain   [facility], Ranell Patrick, MD   4 months ago History of fall   [facility], Ranell Patrick, MD   4 months ago    [facility], Ranell Patrick, MD       Future Appointments             In 2 months Janith Lima, MD [place] at [facility]                ofloxacin (OCUFLOX) 0.3 % ophthalmic solution 5 mL 0    Sig: 10 drops in right ear once daily for 5 days.      Off-Protocol Failed - 03/03/2021 10:49 AM      Failed - Medication not assigned to a protocol, review manually.      Passed - Valid encounter within last 12 months    Recent Outpatient Visits           2 months ago Encounter for screening for COVID-19   [facility], Laurita Quint, FNP   2 months ago Vaginal dryness   [facility], Laurita Quint, FNP   3 months ago Right foot pain   [facility], Ranell Patrick, MD   4 months ago History of fall   [facility], Ranell Patrick, MD   4 months ago    [facility], Ranell Patrick, MD       Future Appointments             In 2 months Janith Lima, MD [place] at [facility]               acetaminophen (TYLENOL 8 HOUR) 650 MG CR tablet 15 tablet 0    Sig: Take 1 tablet (650 mg total) by mouth 2 (two) times daily as needed for pain.      Over the Counter:  OTC Passed - 03/03/2021 10:49 AM      Passed - Valid encounter within last 12 months    Recent Outpatient Visits           2 months ago Encounter for screening for COVID-19   [facility], Laurita Quint, FNP   2 months ago Vaginal dryness   [facility], Laurita Quint, FNP   3 months ago Right foot pain   [facility], Ranell Patrick, MD   4 months ago History of fall   [facility], Ranell Patrick, MD   4 months ago    [facility], Ranell Patrick, MD       Future Appointments  In 2 months Janith Lima, MD [place] at [facility]               ferrous sulfate (FEROSUL) 325 (65 FE) MG tablet 30 tablet 0    Sig: TAKE 1 TABLET(325 MG) BY MOUTH DAILY      Endocrinology:  Minerals - Iron Supplementation Failed - 03/03/2021 10:49 AM      Failed - Fe (serum) in normal range and within 360  days    Iron  Date Value Ref Range Status  01/01/2019 127 27 - 139 ug/dL Final   Iron Saturation  Date Value Ref Range Status  01/01/2019 39 15 - 55 % Final          Failed - Ferritin in normal range and within 360 days    Ferritin  Date Value Ref Range Status  01/01/2019 23 15 - 150 ng/mL Final          Passed - HGB in normal range and within 360 days    Hemoglobin  Date Value Ref Range Status  06/16/2020 11.8 11.1 - 15.9 g/dL Final          Passed - HCT in normal range and within 360 days    Hematocrit  Date Value Ref Range Status  06/16/2020 35.8 34.0 - 46.6 % Final          Passed - RBC in normal range and within 360 days    RBC  Date Value Ref Range Status  06/16/2020 4.07 3.77 - 5.28 x10E6/uL Final  02/04/2019 4.33 3.87 - 5.11 Mil/uL Final          Passed - Valid encounter within last 12 months    Recent Outpatient Visits           2 months ago Encounter for screening for COVID-19   [facility], Laurita Quint, FNP   2 months ago Vaginal dryness   [facility], Laurita Quint, FNP   3 months ago Right foot pain   [facility], Ranell Patrick, MD   4 months ago History of fall   [facility], Ranell Patrick, MD   4 months ago    [facility], Ranell Patrick, MD       Future Appointments             In 2 months Ronnald Ramp Arvid Right, MD [place] at [facility]

## 2021-03-03 NOTE — Telephone Encounter (Signed)
Relation to pt: self  Call back number: [phone] Pharmacy:   [facility] [place], [place] Phone:  [phone]  Fax:  [phone]        Reason for call:  Patient requesting a 90 day supply of the following medication due to the practice closing: Patient contacted pharmacy and was advised to follow up with PCP for additional refills  Patient states she does not want ondansetron but ZOFRAN ODT 4 MG,  verapamil (CALAN-SR) 240 MG CR tablet ,  hydrochlorothiazide (MICROZIDE) 12.5 MG capsule ,  metoprolol succinate (TOPROL-XL) 50 MG 24 hr tablet ,  acetaminophen (TYLENOL 8 HOUR) 650 MG CR tablet ,  traMADol-acetaminophen (ULTRACET) 37.5-325 MG tablet ,  FEROSUL 325 (65 Fe) MG tablet,  latanoprost (XALATAN) 0.005 % ophthalmic solution ,  verapamil (CALAN-SR) 240 MG CR tablet

## 2021-03-03 NOTE — Telephone Encounter (Signed)
Left message on voicemail. Asked patient to call back to schedule an appointment for med refills.

## 2021-03-04 NOTE — Telephone Encounter (Signed)
Patient said she did not need tramadol refill at this time she do have 30 tab left she said she just called out all her meds to the pharmacist

## 2021-03-07 ENCOUNTER — Ambulatory Visit: Payer: Medicare Other | Admitting: Podiatry

## 2021-03-09 ENCOUNTER — Ambulatory Visit (INDEPENDENT_AMBULATORY_CARE_PROVIDER_SITE_OTHER): Payer: Medicare Other | Admitting: Family Medicine

## 2021-03-09 ENCOUNTER — Encounter: Payer: Self-pay | Admitting: Family Medicine

## 2021-03-09 ENCOUNTER — Other Ambulatory Visit: Payer: Self-pay

## 2021-03-09 VITALS — BP 117/62 | HR 70 | Temp 98.4°F | Ht <= 58 in | Wt 119.0 lb

## 2021-03-09 DIAGNOSIS — R600 Localized edema: Secondary | ICD-10-CM | POA: Diagnosis not present

## 2021-03-09 DIAGNOSIS — M5136 Other intervertebral disc degeneration, lumbar region: Secondary | ICD-10-CM | POA: Diagnosis not present

## 2021-03-09 DIAGNOSIS — R197 Diarrhea, unspecified: Secondary | ICD-10-CM | POA: Diagnosis not present

## 2021-03-09 DIAGNOSIS — K219 Gastro-esophageal reflux disease without esophagitis: Secondary | ICD-10-CM | POA: Diagnosis not present

## 2021-03-09 MED ORDER — LOPERAMIDE HCL 2 MG PO CAPS: 2.0000 mg | ORAL_CAPSULE | ORAL | 0 refills | Status: DC | PRN

## 2021-03-09 NOTE — Patient Instructions (Addendum)
Bland Diet A bland diet consists of foods that are often soft and do not have a lot of fat, fiber, or extra seasonings. Foods without fat, fiber, or seasoning are easier for the body to digest. They are also less likely to irritate your mouth, throat, stomach, and other parts of your digestive system. A bland diet is sometimes called a BRAT diet. What is my plan? Your health care provider or food and nutrition specialist (dietitian) may recommend specific changes to your diet to prevent symptoms or to treat your symptoms. These changes may include:  Eating small meals often.  Cooking food until it is soft enough to chew easily.  Chewing your food well.  Drinking fluids slowly.  Not eating foods that are very spicy, sour, or fatty.  Not eating citrus fruits, such as oranges and grapefruit. What do I need to know about this diet?  Eat a variety of foods from the bland diet food list.  Do not follow a bland diet longer than needed.  Ask your health care provider whether you should take vitamins or supplements. What foods can I eat? Grains Hot cereals, such as cream of wheat. Rice. Bread, crackers, or tortillas made from refined white flour.   Vegetables Canned or cooked vegetables. Mashed or boiled potatoes. Fruits Bananas. Applesauce. Other types of cooked or canned fruit with the skin and seeds removed, such as canned peaches or pears.   Meats and other proteins Scrambled eggs. Creamy peanut butter or other nut butters. Lean, well-cooked meats, such as chicken or fish. Tofu. Soups or broths.   Dairy Low-fat dairy products, such as milk, cottage cheese, or yogurt. Beverages Water. Herbal tea. Apple juice.   Fats and oils Mild salad dressings. Canola or olive oil. Sweets and desserts Pudding. Custard. Fruit gelatin. Ice cream. The items listed above may not be a complete list of recommended foods and beverages. Contact a dietitian for more options. What foods are not  recommended? Grains Whole grain breads and cereals. Vegetables Raw vegetables. Fruits Raw fruits, especially citrus, berries, or dried fruits. Dairy Whole fat dairy foods. Beverages Caffeinated drinks. Alcohol. Seasonings and condiments Strongly flavored seasonings or condiments. Hot sauce. Salsa. Other foods Spicy foods. Fried foods. Sour foods, such as pickled or fermented foods. Foods with high sugar content. Foods high in fiber. The items listed above may not be a complete list of foods and beverages to avoid. Contact a dietitian for more information. Summary  A bland diet consists of foods that are often soft and do not have a lot of fat, fiber, or extra seasonings.  Foods without fat, fiber, or seasoning are easier for the body to digest.  Check with your health care provider to see how long you should follow this diet plan. It is not meant to be followed for long periods. This information is not intended to replace advice given to you by your health care provider. Make sure you discuss any questions you have with your health care provider. Document Revised: 01/09/2018 Document Reviewed: 01/09/2018 Elsevier Patient Education  2021 [employer].    Diarrhea, Adult Diarrhea is when you pass loose and watery poop (stool) often. Diarrhea can make you feel weak and cause you to lose water in your body (get dehydrated). Losing water in your body can cause you to:  Feel tired and thirsty.  Have a dry mouth.  Go pee (urinate) less often. Diarrhea often lasts 2-3 days. However, it can last longer if it is a  sign of something more serious. It is important to treat your diarrhea as told by your doctor. Follow these instructions at home: Eating and drinking Follow these instructions as told by your doctor:  Take an ORS (oral rehydration solution). This is a drink that helps you replace fluids and minerals your body lost. It is sold at pharmacies and stores.  Drink plenty of  fluids, such as: ? Water. ? Ice chips. ? Diluted fruit juice. ? Low-calorie sports drinks. ? Milk, if you want.  Avoid drinking fluids that have a lot of sugar or caffeine in them.  Eat bland, easy-to-digest foods in small amounts as you are able. These foods include: ? Bananas. ? Applesauce. ? Rice. ? Low-fat (lean) meats. ? Toast. ? Crackers.  Avoid alcohol.  Avoid spicy or fatty foods.      Medicines  Take over-the-counter and prescription medicines only as told by your doctor.  If you were prescribed an antibiotic medicine, take it as told by your doctor. Do not stop using the antibiotic even if you start to feel better. General instructions  Wash your hands often using soap and water. If soap and water are not available, use a hand sanitizer. Others in your home should wash their hands as well. Hands should be washed: ? After using the toilet or changing a diaper. ? Before preparing, cooking, or serving food. ? While caring for a sick person. ? While visiting someone in a hospital.  Drink enough fluid to keep your pee (urine) pale yellow.  Rest at home while you get better.  Watch your condition for any changes.  Take a warm bath to help with any burning or pain from having diarrhea.  Keep all follow-up visits as told by your doctor. This is important.   Contact a doctor if:  You have a fever.  Your diarrhea gets worse.  You have new symptoms.  You cannot keep fluids down.  You feel light-headed or dizzy.  You have a headache.  You have muscle cramps. Get help right away if:  You have chest pain.  You feel very weak or you pass out (faint).  You have bloody or black poop or poop that looks like tar.  You have very bad pain, cramping, or bloating in your belly (abdomen).  You have trouble breathing or you are breathing very quickly.  Your heart is beating very quickly.  Your skin feels cold and clammy.  You feel confused.  You have  signs of losing too much water in your body, such as: ? Dark pee, very little pee, or no pee. ? Cracked lips. ? Dry mouth. ? Sunken eyes. ? Sleepiness. ? Weakness. Summary  Diarrhea is when you pass loose and watery poop (stool) often.  Diarrhea can make you feel weak and cause you to lose water in your body (get dehydrated).  Take an ORS (oral rehydration solution). This is a drink that is sold at pharmacies and stores.  Eat bland, easy-to-digest foods in small amounts as you are able.  Contact a doctor if your condition gets worse. Get help right away if you have signs that you have lost too much water in your body. This information is not intended to replace advice given to you by your health care provider. Make sure you discuss any questions you have with your health care provider. Document Revised: 05/17/2018 Document Reviewed: 05/17/2018 Elsevier Patient Education  2021 [employer].    If you have lab work done today  you will be contacted with your lab results within the next 2 weeks.  If you have not heard from [place] then please contact us. The fastest way to get your results is to register for My Chart.   IF you received an x-ray today, you will receive an invoice from [facility] Rad[facility]. Please contact [facility] at [phone] with questions or concerns regarding your invoice.   IF you received labwork today, you will receive an invoice from [facility]. Please contact [facility] at [phone] with questions or concerns regarding your invoice.   Our billing staff will not be able to assist you with questions regarding bills from these companies.  You will be contacted with the lab results as soon as they are available. The fastest way to get your results is to activate your My Chart account. Instructions are located on the last page of this paperwork. If you have not heard from [place] regarding the results in 2 weeks, please contact this office.

## 2021-03-09 NOTE — Progress Notes (Signed)
3/16/20224:10 PM  [name] 1933/04/05, 87 y.o., female [id number]  Chief Complaint  Patient presents with  . Diarrhea    For about 3 days , on and off all day     HPI:   Patient is a 87 y.o. female with past medical history significant for HTN, migraines, arthritis, and CAD who presents today for diarrhea  Will be Seeing Dr. Ronnald Ramp in primary care Has been going to [place] for 35 years Is stressed about finding a new primary care  Has been having diarrhea for 3 days Has been taking imodium Is overall feeling well Doesn't feel is connected to food or stool softeners Denies abdominal pain and cramping Denies systemic illness symptoms    Depression screen [facility] 2/9 03/09/2021 12/30/2020 12/15/2020  Decreased Interest 0 0 0  Down, Depressed, Hopeless 0 0 0  PHQ - 2 Score 0 0 0  Altered sleeping - - -  Tired, decreased energy - - -  Change in appetite - - -  Feeling bad or failure about yourself  - - -  Trouble concentrating - - -  Moving slowly or fidgety/restless - - -  Suicidal thoughts - - -  PHQ-9 Score - - -  Difficult doing work/chores - - -  Some recent data might be hidden    Fall Risk  03/09/2021 12/30/2020 12/15/2020 11/25/2020 10/29/2020  Falls in the past year? 0 0 0 1 1  Number falls in past yr: 0 0 0 0 0  Injury with Fall? 0 0 0 0 1  Risk for fall due to : - - - - History of fall(s)  Follow up Falls evaluation completed Falls evaluation completed Falls evaluation completed Falls evaluation completed Falls evaluation completed     No Known Allergies  Prior to Admission medications   Medication Sig Start Date End Date Taking? Authorizing Provider  acetaminophen (TYLENOL 8 HOUR) 650 MG CR tablet Take 1 tablet (650 mg total) by mouth 2 (two) times daily as needed for pain. 12/11/20   Petrucelli, Samantha R, PA-C  Calcium Carbonate-Vitamin D (CALCIUM-VITAMIN D) 500-200 MG-UNIT tablet Take 3 tablets by mouth every morning.    [provider]   calcium-vitamin D (OSCAL WITH D) 500-200 MG-UNIT TABS tablet calcium carbonate 500 mg (1,250 mg)-vitamin D3 200 unit tablet   3 tbls by oral route.    [provider]  diclofenac Sodium (VOLTAREN) 1 % GEL Voltaren 1 % topical gel    [provider]  Elastic Bandages & Supports (LUMBAR BACK BRACE/SUPPORT PAD) MISC Dispense one back brace to be worn while doing activities as needed daily 11/03/19   Delia Chimes A, MD  ESTRACE VAGINAL 0.1 MG/GM vaginal cream INSERT 1 APPLICATORFUL VAGINALLY ONCE A WEEK 07/15/19   Forrest Moron, MD  FEROSUL 325 (65 Fe) MG tablet TAKE 1 TABLET(325 MG) BY MOUTH DAILY 01/12/20   Forrest Moron, MD  gabapentin (NEURONTIN) 800 MG tablet TAKE 1 TABLET(800 MG) BY MOUTH AT BEDTIME 09/30/20   Wendie Agreste, MD  hydrochlorothiazide (MICROZIDE) 12.5 MG capsule TAKE 1 CAPSULE BY MOUTH EVERY Monday Wednesday AND Friday 07/05/20   Maximiano Coss, NP  hydrocortisone 2.5 % ointment APPLY EXTERNALLY TO THE AFFECTED AREA TWICE DAILY 05/31/18   Wardell Honour, MD  hydroxypropyl methylcellulose (ISOPTO TEARS) 2.5 % ophthalmic solution Place 1 drop into both eyes 2 (two) times daily as needed for dry eyes.     [provider]  lactose free nutrition (BOOST) LIQD Take  237 mLs by mouth 2 (two) times daily between meals. Patient taking differently: Take 1 Container by mouth 2 (two) times daily as needed (nutritional).  03/22/16   [name], MD  latanoprost (XALATAN) 0.005 % ophthalmic solution INT 1 GTT IN OD QD 08/26/19   [provider]  lidocaine (LIDODERM) 5 % Place 1 patch onto the skin daily as needed. Apply patch to area most significant pain once per day.  Remove and discard patch within 12 hours of application. 12/11/20   Petrucelli, Samantha R, PA-C  metoprolol succinate (TOPROL-XL) 50 MG 24 hr tablet TAKE 1 TABLET BY MOUTH EVERY DAY WITH OR IMMEDIATELY FOLLOWING A MEAL 03/03/20   Forrest Moron, MD  NON FORMULARY     [provider]  nystatin cream (MYCOSTATIN) Apply to external affected area twice a day for 10 days as needed. 11/08/19   Forrest Moron, MD  ofloxacin (OCUFLOX) 0.3 % ophthalmic solution 10 drops in right ear once daily for 5 days. 01/29/19   Tasia Catchings, Amy V, PA-C  pantoprazole (PROTONIX) 40 MG tablet TAKE 1 TABLET(40 MG) BY MOUTH TWICE DAILY 10/05/20   Armbruster, Carlota Raspberry, MD  PREMARIN vaginal cream  10/10/19   [provider]  promethazine (PHENERGAN) 12.5 MG tablet take 1 tablet by mouth every 6 hours if needed for nausea and vomiting 11/03/19   Delia Chimes A, MD  sucralfate (CARAFATE) 1 g tablet Take 1 tablet (1 g total) by mouth every 6 (six) hours as needed. Slowly dissolve tablet in 1 Tablespoon of distilled water for 15 minutes before taking (do not crush) 07/15/19   Armbruster, Carlota Raspberry, MD  tiZANidine (ZANAFLEX) 4 MG tablet Take 1 tablet (4 mg total) by mouth every 6 (six) hours as needed for muscle spasms. 08/29/20   Orvan July, NP  traMADol-acetaminophen (ULTRACET) 37.5-325 MG tablet TAKE 1 TABLET BY MOUTH EVERY 6 HOURS 10/15/20   Wendie Agreste, MD  triamcinolone cream (KENALOG) 0.1 % Apply topically 2 (two) times daily. 09/04/18   Forrest Moron, MD  verapamil (CALAN-SR) 240 MG CR tablet Take 1 tablet (240 mg total) by mouth daily. 11/03/19   Forrest Moron, MD  vitamin C (ASCORBIC ACID) 500 MG tablet Take 500 mg by mouth daily.    [provider]  ZINC OXIDE, TOPICAL, 10 % CREA 1 application to affected area to prevent moisture and further irritation of skin. 01/29/19   Ok Edwards, PA-C    Past Medical History:  Diagnosis Date  . Abnormal finding on Pap smear, ASCUS   . Acute renal insufficiency   . Amputation finger    index  . Barrett's esophagus   . CAD (coronary artery disease)   . Dementia ([place])   . Diverticulosis   . Elevated BP   . Foot deformity   . GERD (gastroesophageal reflux disease)   . Glaucoma   . H/O: GI bleed   . HSV-2 (herpes simplex virus 2)  infection   . Hyperglycemia   . Hypertension   . Migraine   . Osteoporosis    worsening  . Spondylosis     Past Surgical History:  Procedure Laterality Date  . AMPUTATION FINGER / THUMB     secondary to osteomyelitis  . CATARACT EXTRACTION, BILATERAL Bilateral 12/25/2016  . COLONOSCOPY    . ESOPHAGOGASTRODUODENOSCOPY N/A 10/22/2016   Procedure: ESOPHAGOGASTRODUODENOSCOPY (EGD);  Surgeon: Doran Stabler, MD;  Location: [facility] ENDOSCOPY;  Service: Endoscopy;  Laterality: N/A;  .  FOOT SURGERY     right  . PARTIAL HYSTERECTOMY    . UPPER GASTROINTESTINAL ENDOSCOPY      Social History   Tobacco Use  . Smoking status: Never Smoker  . Smokeless tobacco: Never Used  Substance Use Topics  . Alcohol use: No    Alcohol/week: 0.0 standard drinks    Family History  Problem Relation Age of Onset  . Breast cancer Mother   . Heart attack Mother   . Heart disease Father   . Alcohol abuse Son   . Colon cancer Neg Hx   . Stomach cancer Neg Hx   . Rectal cancer Neg Hx     Review of Systems  Constitutional: Negative for chills, fever and malaise/fatigue.  Eyes: Negative for blurred vision and double vision.  Respiratory: Negative for cough, shortness of breath and wheezing.   Cardiovascular: Positive for leg swelling. Negative for chest pain and palpitations.  Gastrointestinal: Positive for diarrhea and heartburn. Negative for abdominal pain, blood in stool, constipation, nausea and vomiting.  Genitourinary: Negative for dysuria, flank pain, frequency, hematuria and urgency.  Musculoskeletal: Positive for back pain. Negative for joint pain.  Skin: Negative for rash.  Neurological: Positive for weakness (generalized). Negative for dizziness and headaches.    OBJECTIVE:  Today's Vitals   03/09/21 1452  BP: 117/62  Pulse: 70  Temp: 98.4 F (36.9 C)  SpO2: 96%  Weight: 119 lb (54 kg)  Height: 4\' 10"  (1.473 m)   Body mass index is 24.87 kg/m.   Physical  Exam Constitutional:      General: She is not in acute distress.    Appearance: Normal appearance. She is not ill-appearing.  HENT:     Head: Normocephalic.  Cardiovascular:     Rate and Rhythm: Normal rate and regular rhythm.     Pulses: Normal pulses.     Heart sounds: Normal heart sounds. No murmur heard. No friction rub. No gallop.   Pulmonary:     Effort: Pulmonary effort is normal. No respiratory distress.     Breath sounds: Normal breath sounds. No stridor. No wheezing, rhonchi or rales.  Abdominal:     General: Bowel sounds are normal. There is no distension.     Palpations: Abdomen is soft. There is no mass.     Tenderness: There is no abdominal tenderness. There is no right CVA tenderness, left CVA tenderness, guarding or rebound.  Musculoskeletal:     Thoracic back: Normal.     Lumbar back: Normal.       Back:     Right lower leg: Edema present.     Left lower leg: Edema present.  Skin:    General: Skin is warm and dry.  Neurological:     Mental Status: She is alert and oriented to person, place, and time.     Motor: Weakness present.     Gait: Gait abnormal.  Psychiatric:        Mood and Affect: Mood normal.        Behavior: Behavior normal.     No results found for this or any previous visit (from the past 24 hour(s)).  No results found.   ASSESSMENT and PLAN  Problem List Items Addressed This Visit      Digestive   Gastroesophageal reflux disease without esophagitis   Relevant Medications   loperamide (IMODIUM) 2 MG capsule     Musculoskeletal and Integument   Degenerative disc disease, lumbar    Other Visit Diagnoses  Diarrhea, unspecified type    -  Primary   Relevant Medications   loperamide (IMODIUM) 2 MG capsule   Lower leg edema              Plan   Discussed preventing dehydration, increase fluid intake  Discussed BRAT diet  Pause all stool softeners  Use Imodium as needed  Return if symptoms worsen or fail to  improve.    Huston Foley Belina Mandile, FNP-BC Primary Care at [place], [place] [address] Ph.  [phone] Fax [phone]

## 2021-03-10 ENCOUNTER — Telehealth: Payer: Self-pay | Admitting: Family Medicine

## 2021-03-10 NOTE — Telephone Encounter (Signed)
Patient called to request information needed for her new primary care provider; patient filling out a new patient form.  Please call patient to tell her dates of the last time she had the following:  -colonoscopy - tetanus shot - pneumonia shot - tb shot  Please advise patient at [phone].

## 2021-03-10 NOTE — Telephone Encounter (Signed)
Called back  Colonoscopy 03/24/2010 Tdap 07/15/2007   Call got disconnected- she will likely call back for these dates   Pneumococal 23  11/04/2016 PPD 10/16/2018

## 2021-03-22 DIAGNOSIS — H1045 Other chronic allergic conjunctivitis: Secondary | ICD-10-CM | POA: Diagnosis not present

## 2021-03-22 DIAGNOSIS — H401131 Primary open-angle glaucoma, bilateral, mild stage: Secondary | ICD-10-CM | POA: Diagnosis not present

## 2021-03-22 DIAGNOSIS — Z961 Presence of intraocular lens: Secondary | ICD-10-CM | POA: Diagnosis not present

## 2021-03-22 DIAGNOSIS — H04123 Dry eye syndrome of bilateral lacrimal glands: Secondary | ICD-10-CM | POA: Diagnosis not present

## 2021-04-05 ENCOUNTER — Other Ambulatory Visit: Payer: Self-pay | Admitting: Family Medicine

## 2021-04-05 DIAGNOSIS — M5136 Other intervertebral disc degeneration, lumbar region: Secondary | ICD-10-CM

## 2021-04-07 NOTE — Telephone Encounter (Signed)
Opened in error

## 2021-04-11 ENCOUNTER — Ambulatory Visit: Payer: Self-pay | Admitting: Family Medicine

## 2021-04-15 DIAGNOSIS — Z23 Encounter for immunization: Secondary | ICD-10-CM | POA: Diagnosis not present

## 2021-04-22 ENCOUNTER — Other Ambulatory Visit: Payer: Self-pay | Admitting: Family Medicine

## 2021-04-22 DIAGNOSIS — D539 Nutritional anemia, unspecified: Secondary | ICD-10-CM

## 2021-04-25 ENCOUNTER — Other Ambulatory Visit: Payer: Self-pay | Admitting: Family Medicine

## 2021-04-25 DIAGNOSIS — M5136 Other intervertebral disc degeneration, lumbar region: Secondary | ICD-10-CM

## 2021-04-25 NOTE — Telephone Encounter (Signed)
Last cbc 10 months ago, hemoglobin 11.8

## 2021-05-05 ENCOUNTER — Ambulatory Visit (INDEPENDENT_AMBULATORY_CARE_PROVIDER_SITE_OTHER): Payer: Medicare Other | Admitting: Internal Medicine

## 2021-05-05 ENCOUNTER — Other Ambulatory Visit: Payer: Self-pay

## 2021-05-05 ENCOUNTER — Encounter: Payer: Self-pay | Admitting: Internal Medicine

## 2021-05-05 VITALS — BP 154/78 | HR 68 | Temp 98.2°F | Resp 16 | Ht <= 58 in | Wt 121.0 lb

## 2021-05-05 DIAGNOSIS — M479 Spondylosis, unspecified: Secondary | ICD-10-CM

## 2021-05-05 DIAGNOSIS — Z0000 Encounter for general adult medical examination without abnormal findings: Secondary | ICD-10-CM

## 2021-05-05 DIAGNOSIS — N1832 Chronic kidney disease, stage 3b: Secondary | ICD-10-CM | POA: Diagnosis not present

## 2021-05-05 DIAGNOSIS — M5136 Other intervertebral disc degeneration, lumbar region: Secondary | ICD-10-CM

## 2021-05-05 DIAGNOSIS — Z0001 Encounter for general adult medical examination with abnormal findings: Secondary | ICD-10-CM

## 2021-05-05 DIAGNOSIS — I10 Essential (primary) hypertension: Secondary | ICD-10-CM | POA: Diagnosis not present

## 2021-05-05 LAB — CBC WITH DIFFERENTIAL/PLATELET
Basophils Absolute: 0.1 10*3/uL (ref 0.0–0.1)
Basophils Relative: 1.1 % (ref 0.0–3.0)
Eosinophils Absolute: 0.2 10*3/uL (ref 0.0–0.7)
Eosinophils Relative: 4.3 % (ref 0.0–5.0)
HCT: 34.4 % — ABNORMAL LOW (ref 36.0–46.0)
Hemoglobin: 11.6 g/dL — ABNORMAL LOW (ref 12.0–15.0)
Lymphocytes Relative: 15.8 % (ref 12.0–46.0)
Lymphs Abs: 0.8 10*3/uL (ref 0.7–4.0)
MCHC: 33.7 g/dL (ref 30.0–36.0)
MCV: 86.3 fl (ref 78.0–100.0)
Monocytes Absolute: 0.5 10*3/uL (ref 0.1–1.0)
Monocytes Relative: 9.7 % (ref 3.0–12.0)
Neutro Abs: 3.6 10*3/uL (ref 1.4–7.7)
Neutrophils Relative %: 69.1 % (ref 43.0–77.0)
Platelets: 320 10*3/uL (ref 150.0–400.0)
RBC: 3.99 Mil/uL (ref 3.87–5.11)
RDW: 14.6 % (ref 11.5–15.5)
WBC: 5.2 10*3/uL (ref 4.0–10.5)

## 2021-05-05 LAB — HEPATIC FUNCTION PANEL
ALT: 10 U/L (ref 0–35)
AST: 15 U/L (ref 0–37)
Albumin: 3.7 g/dL (ref 3.5–5.2)
Alkaline Phosphatase: 57 U/L (ref 39–117)
Bilirubin, Direct: 0 mg/dL (ref 0.0–0.3)
Total Bilirubin: 0.3 mg/dL (ref 0.2–1.2)
Total Protein: 6.7 g/dL (ref 6.0–8.3)

## 2021-05-05 LAB — BASIC METABOLIC PANEL
BUN: 17 mg/dL (ref 6–23)
CO2: 31 mEq/L (ref 19–32)
Calcium: 9.5 mg/dL (ref 8.4–10.5)
Chloride: 106 mEq/L (ref 96–112)
Creatinine, Ser: 0.96 mg/dL (ref 0.40–1.20)
GFR: 53.91 mL/min — ABNORMAL LOW (ref >60.00)
Glucose, Bld: 91 mg/dL (ref 70–99)
Potassium: 4.1 mEq/L (ref 3.5–5.1)
Sodium: 144 mEq/L (ref 135–145)

## 2021-05-05 LAB — TSH: TSH: 1.86 u[IU]/mL (ref 0.35–4.50)

## 2021-05-05 MED ORDER — TRAMADOL-ACETAMINOPHEN 37.5-325 MG PO TABS: 1.0000 | ORAL_TABLET | Freq: Four times a day (QID) | ORAL | 3 refills | Status: DC

## 2021-05-05 MED ORDER — GABAPENTIN 800 MG PO TABS: 800.0000 mg | ORAL_TABLET | Freq: Every day | ORAL | 1 refills | Status: DC

## 2021-05-05 MED ORDER — MELOXICAM 7.5 MG PO TABS: 7.5000 mg | ORAL_TABLET | Freq: Every day | ORAL | 1 refills | Status: DC

## 2021-05-05 MED ORDER — METOPROLOL SUCCINATE ER 50 MG PO TB24: ORAL_TABLET | ORAL | 1 refills | Status: DC

## 2021-05-05 NOTE — Progress Notes (Signed)
Subjective:  Patient ID: [name], female    DOB: [birth year]  Age: 87 y.o. MRN: [id number]  CC: Anemia, Annual Exam, and Back Pain  This visit occurred during the SARS-CoV-2 public health emergency.  Safety protocols were in place, including screening questions prior to the visit, additional usage of staff PPE, and extensive cleaning of exam room while observing appropriate contact time as indicated for disinfecting solutions.    HPI [name] presents for a CPX, f/up, and to establish.  She has chronic low back pain and ambulates with a nearly 90 degree flexion in her lumbar spine.  She requests a refill of tramadol, acetaminophen, and meloxicam.  She also has a history of high blood pressure.  She has chronic unchanged lower extremity edema.  She denies chest pain, shortness of breath, dizziness, or lightheadedness.  History [name] has a past medical history of Abnormal finding on Pap smear, ASCUS, Acute renal insufficiency, Amputation finger, Barrett's esophagus, CAD (coronary artery disease), Dementia ([place]), Diverticulosis, Elevated BP, Foot deformity, GERD (gastroesophageal reflux disease), Glaucoma, H/O: GI bleed, HSV-2 (herpes simplex virus 2) infection, Hyperglycemia, Hypertension, Migraine, Osteoporosis, and Spondylosis.   She has a past surgical history that includes Partial hysterectomy; Amputation finger / thumb; Foot surgery; Upper gastrointestinal endoscopy; Colonoscopy; Esophagogastroduodenoscopy (N/A, 10/22/2016); and Cataract extraction, bilateral (Bilateral, 12/25/2016).   Her family history includes Alcohol abuse in her son; Breast cancer in her mother; Heart attack in her mother; Heart disease in her father.She reports that she has never smoked. She has never used smokeless tobacco. She reports that she does not drink alcohol and does not use drugs.   ROS Review of Systems  Constitutional: Negative.  Negative for appetite change, diaphoresis and fatigue.  HENT:  Negative.   Eyes: Negative.   Cardiovascular: Positive for leg swelling. Negative for chest pain and palpitations.  Gastrointestinal: Negative for abdominal pain and diarrhea.  Endocrine: Negative.   Genitourinary: Negative.   Musculoskeletal: Positive for back pain.  Skin: Negative.   Neurological: Negative.  Negative for dizziness, weakness and light-headedness.  Hematological: Negative for adenopathy. Does not bruise/bleed easily.  Psychiatric/Behavioral: Negative.     Objective:  BP (!) 154/78 (BP Location: Right Arm, Patient Position: Sitting, Cuff Size: Large)   Pulse 68   Temp 98.2 F (36.8 C) (Oral)   Resp 16   Ht 4\' 10"  (1.473 m)   Wt 121 lb (54.9 kg)   SpO2 98%   BMI 25.29 kg/m   Physical Exam Vitals reviewed.  HENT:     Nose: Nose normal.     Mouth/Throat:     Mouth: Mucous membranes are moist.  Eyes:     General: No scleral icterus.    Conjunctiva/sclera: Conjunctivae normal.  Cardiovascular:     Rate and Rhythm: Normal rate and regular rhythm.     Pulses: Normal pulses.     Heart sounds: No murmur heard.   Pulmonary:     Effort: Pulmonary effort is normal.     Breath sounds: No stridor. No wheezing or rales.  Abdominal:     General: Abdomen is flat.     Palpations: There is no mass.     Tenderness: There is no abdominal tenderness.  Musculoskeletal:        General: Normal range of motion.     Cervical back: Normal and neck supple.     Thoracic back: Normal.     Lumbar back: Deformity present. No bony tenderness. Negative right straight leg raise test and negative  left straight leg raise test.     Right lower leg: 1+ Pitting Edema present.     Left lower leg: 1+ Pitting Edema present.  Lymphadenopathy:     Cervical: No cervical adenopathy.  Skin:    General: Skin is warm and dry.  Neurological:     Mental Status: Mental status is at baseline.  Psychiatric:        Mood and Affect: Mood normal.        Behavior: Behavior normal.     Lab  Results  Component Value Date   WBC 5.2 05/05/2021   HGB 11.6 (L) 05/05/2021   HCT 34.4 (L) 05/05/2021   PLT 320.0 05/05/2021   GLUCOSE 91 05/05/2021   CHOL 175 11/03/2019   TRIG 79 11/03/2019   HDL 82 11/03/2019   LDLCALC 78 11/03/2019   ALT 10 05/05/2021   AST 15 05/05/2021   NA 144 05/05/2021   K 4.1 05/05/2021   CL 106 05/05/2021   CREATININE 0.96 05/05/2021   BUN 17 05/05/2021   CO2 31 05/05/2021   TSH 1.86 05/05/2021   HGBA1C 5.4 05/30/2017    Assessment & Plan:   [name] was seen today for anemia, annual exam and back pain.  Diagnoses and all orders for this visit:  Essential hypertension- Her blood pressure is adequately well controlled. -     metoprolol succinate (TOPROL-XL) 50 MG 24 hr tablet; TAKE 1 TABLET BY MOUTH EVERY DAY WITH OR IMMEDIATELY FOLLOWING A MEAL -     CBC with Differential/Platelet; Future -     Basic metabolic panel; Future -     TSH; Future -     Hepatic function panel; Future -     Hepatic function panel -     TSH -     Basic metabolic panel -     CBC with Differential/Platelet  Degenerative disc disease, lumbar -     Discontinue: meloxicam (MOBIC) 7.5 MG tablet; Take 1 tablet (7.5 mg total) by mouth daily. -     traMADol-acetaminophen (ULTRACET) 37.5-325 MG tablet; Take 1 tablet by mouth every 6 (six) hours.  Spondylosis -     gabapentin (NEURONTIN) 800 MG tablet; Take 1 tablet (800 mg total) by mouth at bedtime. -     traMADol-acetaminophen (ULTRACET) 37.5-325 MG tablet; Take 1 tablet by mouth every 6 (six) hours.  Stage 3b chronic kidney disease ([place])- I recommended that she avoid NSAIDs.  Her anemia is likely related to kidney disease but I have asked her to return to be screened for vitamin deficiencies. -     Ambulatory referral to Nephrology   I have discontinued [name]'s vitamin C, lactose free nutrition, calcium-vitamin D, triamcinolone cream, ZINC OXIDE (TOPICAL), sucralfate, Premarin, Lumbar Back Brace/Support Pad,  calcium-vitamin D, tiZANidine, meloxicam, acetaminophen, loperamide, and meloxicam. I have also changed her gabapentin. Additionally, I am having her maintain her hydroxypropyl methylcellulose / hypromellose, hydrocortisone, ESTRACE VAGINAL, latanoprost, nystatin cream, NON FORMULARY, lidocaine, diclofenac Sodium, polyethylene glycol powder, pantoprazole, promethazine, ondansetron, verapamil, hydrochlorothiazide, ofloxacin, FeroSul, metoprolol succinate, and traMADol-acetaminophen.  Meds ordered this encounter  Medications  . gabapentin (NEURONTIN) 800 MG tablet    Sig: Take 1 tablet (800 mg total) by mouth at bedtime.    Dispense:  90 tablet    Refill:  1  . DISCONTD: meloxicam (MOBIC) 7.5 MG tablet    Sig: Take 1 tablet (7.5 mg total) by mouth daily.    Dispense:  90 tablet    Refill:  1  .  metoprolol succinate (TOPROL-XL) 50 MG 24 hr tablet    Sig: TAKE 1 TABLET BY MOUTH EVERY DAY WITH OR IMMEDIATELY FOLLOWING A MEAL    Dispense:  90 tablet    Refill:  1  . traMADol-acetaminophen (ULTRACET) 37.5-325 MG tablet    Sig: Take 1 tablet by mouth every 6 (six) hours.    Dispense:  60 tablet    Refill:  3     Follow-up: Return in about 6 months (around 11/05/2021).  Scarlette Calico, MD

## 2021-05-06 ENCOUNTER — Encounter: Payer: Self-pay | Admitting: Internal Medicine

## 2021-05-06 DIAGNOSIS — Z0001 Encounter for general adult medical examination with abnormal findings: Secondary | ICD-10-CM | POA: Insufficient documentation

## 2021-05-06 NOTE — Patient Instructions (Signed)
Health Maintenance, Female Adopting a healthy lifestyle and getting preventive care are important in promoting health and wellness. Ask your health care provider about:  The right schedule for you to have regular tests and exams.  Things you can do on your own to prevent diseases and keep yourself healthy. What should I know about diet, weight, and exercise? Eat a healthy diet  Eat a diet that includes plenty of vegetables, fruits, low-fat dairy products, and lean protein.  Do not eat a lot of foods that are high in solid fats, added sugars, or sodium.   Maintain a healthy weight Body mass index (BMI) is used to identify weight problems. It estimates body fat based on height and weight. Your health care provider can help determine your BMI and help you achieve or maintain a healthy weight. Get regular exercise Get regular exercise. This is one of the most important things you can do for your health. Most adults should:  Exercise for at least 150 minutes each week. The exercise should increase your heart rate and make you sweat (moderate-intensity exercise).  Do strengthening exercises at least twice a week. This is in addition to the moderate-intensity exercise.  Spend less time sitting. Even light physical activity can be beneficial. Watch cholesterol and blood lipids Have your blood tested for lipids and cholesterol at 85 years of age, then have this test every 5 years. Have your cholesterol levels checked more often if:  Your lipid or cholesterol levels are high.  You are older than 85 years of age.  You are at high risk for heart disease. What should I know about cancer screening? Depending on your health history and family history, you may need to have cancer screening at various ages. This may include screening for:  Breast cancer.  Cervical cancer.  Colorectal cancer.  Skin cancer.  Lung cancer. What should I know about heart disease, diabetes, and high blood  pressure? Blood pressure and heart disease  High blood pressure causes heart disease and increases the risk of stroke. This is more likely to develop in people who have high blood pressure readings, are of African descent, or are overweight.  Have your blood pressure checked: ? Every 3-5 years if you are 18-39 years of age. ? Every year if you are 40 years old or older. Diabetes Have regular diabetes screenings. This checks your fasting blood sugar level. Have the screening done:  Once every three years after age 40 if you are at a normal weight and have a low risk for diabetes.  More often and at a younger age if you are overweight or have a high risk for diabetes. What should I know about preventing infection? Hepatitis B If you have a higher risk for hepatitis B, you should be screened for this virus. Talk with your health care provider to find out if you are at risk for hepatitis B infection. Hepatitis C Testing is recommended for:  Everyone born from 1945 through 1965.  Anyone with known risk factors for hepatitis C. Sexually transmitted infections (STIs)  Get screened for STIs, including gonorrhea and chlamydia, if: ? You are sexually active and are younger than 85 years of age. ? You are older than 85 years of age and your health care provider tells you that you are at risk for this type of infection. ? Your sexual activity has changed since you were last screened, and you are at increased risk for chlamydia or gonorrhea. Ask your health care provider   if you are at risk.  Ask your health care provider about whether you are at high risk for HIV. Your health care provider may recommend a prescription medicine to help prevent HIV infection. If you choose to take medicine to prevent HIV, you should first get tested for HIV. You should then be tested every 3 months for as long as you are taking the medicine. Pregnancy  If you are about to stop having your period (premenopausal) and  you may become pregnant, seek counseling before you get pregnant.  Take 400 to 800 micrograms (mcg) of folic acid every day if you become pregnant.  Ask for birth control (contraception) if you want to prevent pregnancy. Osteoporosis and menopause Osteoporosis is a disease in which the bones lose minerals and strength with aging. This can result in bone fractures. If you are 65 years old or older, or if you are at risk for osteoporosis and fractures, ask your health care provider if you should:  Be screened for bone loss.  Take a calcium or vitamin D supplement to lower your risk of fractures.  Be given hormone replacement therapy (HRT) to treat symptoms of menopause. Follow these instructions at home: Lifestyle  Do not use any products that contain nicotine or tobacco, such as cigarettes, e-cigarettes, and chewing tobacco. If you need help quitting, ask your health care provider.  Do not use street drugs.  Do not share needles.  Ask your health care provider for help if you need support or information about quitting drugs. Alcohol use  Do not drink alcohol if: ? Your health care provider tells you not to drink. ? You are pregnant, may be pregnant, or are planning to become pregnant.  If you drink alcohol: ? Limit how much you use to 0-1 drink a day. ? Limit intake if you are breastfeeding.  Be aware of how much alcohol is in your drink. In the [place] one drink equals one 12 oz bottle of beer (355 mL), one 5 oz glass of wine (148 mL), or one 1 oz glass of hard liquor (44 mL). General instructions  Schedule regular health, dental, and eye exams.  Stay current with your vaccines.  Tell your health care provider if: ? You often feel depressed. ? You have ever been abused or do not feel safe at home. Summary  Adopting a healthy lifestyle and getting preventive care are important in promoting health and wellness.  Follow your health care provider's instructions about healthy  diet, exercising, and getting tested or screened for diseases.  Follow your health care provider's instructions on monitoring your cholesterol and blood pressure. This information is not intended to replace advice given to you by your health care provider. Make sure you discuss any questions you have with your health care provider. Document Revised: 12/04/2018 Document Reviewed: 12/04/2018 Elsevier Patient Education  2021 [employer]  

## 2021-05-06 NOTE — Assessment & Plan Note (Signed)
Exam completed Labs reviewed Vaccines are up-to-date No cancer screenings indicated Patient education material was given.

## 2021-05-10 ENCOUNTER — Other Ambulatory Visit: Payer: Self-pay | Admitting: Internal Medicine

## 2021-05-10 ENCOUNTER — Telehealth: Payer: Self-pay | Admitting: Internal Medicine

## 2021-05-10 DIAGNOSIS — M5136 Other intervertebral disc degeneration, lumbar region: Secondary | ICD-10-CM

## 2021-05-10 DIAGNOSIS — M479 Spondylosis, unspecified: Secondary | ICD-10-CM

## 2021-05-10 MED ORDER — TRAMADOL-ACETAMINOPHEN 37.5-325 MG PO TABS: 1.0000 | ORAL_TABLET | Freq: Four times a day (QID) | ORAL | 3 refills | Status: DC

## 2021-05-10 NOTE — Telephone Encounter (Signed)
traMADol-acetaminophen (ULTRACET) 37.5-325 MG tablet [facility] calling, states they see where this medication was sent in on the 12th but there was a glitch in the system that deleted it out, asking if we send it in again.  [facility] [phone] - [address], [place] AT [address] Phone:  [phone]  Fax:  [phone]

## 2021-05-10 NOTE — Telephone Encounter (Signed)
Since PCP will return tomorrow I would recommend this wait. This is a new patient and new rx so no pain contract on file.

## 2021-05-16 ENCOUNTER — Telehealth: Payer: Self-pay | Admitting: Internal Medicine

## 2021-05-16 ENCOUNTER — Other Ambulatory Visit: Payer: Self-pay

## 2021-05-16 ENCOUNTER — Ambulatory Visit (INDEPENDENT_AMBULATORY_CARE_PROVIDER_SITE_OTHER): Payer: Medicare Other | Admitting: Internal Medicine

## 2021-05-16 ENCOUNTER — Encounter: Payer: Self-pay | Admitting: Internal Medicine

## 2021-05-16 VITALS — BP 150/80 | HR 89 | Temp 98.3°F | Ht <= 58 in | Wt 121.0 lb

## 2021-05-16 DIAGNOSIS — D539 Nutritional anemia, unspecified: Secondary | ICD-10-CM

## 2021-05-16 DIAGNOSIS — I10 Essential (primary) hypertension: Secondary | ICD-10-CM | POA: Diagnosis not present

## 2021-05-16 LAB — CBC WITH DIFFERENTIAL/PLATELET
Basophils Absolute: 0.1 10*3/uL (ref 0.0–0.1)
Basophils Relative: 0.8 % (ref 0.0–3.0)
Eosinophils Absolute: 0.2 10*3/uL (ref 0.0–0.7)
Eosinophils Relative: 3.4 % (ref 0.0–5.0)
HCT: 34.9 % — ABNORMAL LOW (ref 36.0–46.0)
Hemoglobin: 11.7 g/dL — ABNORMAL LOW (ref 12.0–15.0)
Lymphocytes Relative: 10.9 % — ABNORMAL LOW (ref 12.0–46.0)
Lymphs Abs: 0.7 10*3/uL (ref 0.7–4.0)
MCHC: 33.4 g/dL (ref 30.0–36.0)
MCV: 86.9 fl (ref 78.0–100.0)
Monocytes Absolute: 0.5 10*3/uL (ref 0.1–1.0)
Monocytes Relative: 6.8 % (ref 3.0–12.0)
Neutro Abs: 5.3 10*3/uL (ref 1.4–7.7)
Neutrophils Relative %: 78.1 % — ABNORMAL HIGH (ref 43.0–77.0)
Platelets: 338 10*3/uL (ref 150.0–400.0)
RBC: 4.02 Mil/uL (ref 3.87–5.11)
RDW: 14.5 % (ref 11.5–15.5)
WBC: 6.8 10*3/uL (ref 4.0–10.5)

## 2021-05-16 LAB — FOLATE: Folate: >24.4 ng/mL (ref >5.9)

## 2021-05-16 LAB — RETICULOCYTES
ABS Retic: 48240 cells/uL (ref 20000–80000)
Retic Ct Pct: 1.2 %

## 2021-05-16 LAB — VITAMIN B12: Vitamin B-12: 675 pg/mL (ref 211–911)

## 2021-05-16 LAB — FERRITIN: Ferritin: 21.2 ng/mL (ref 10.0–291.0)

## 2021-05-16 NOTE — Telephone Encounter (Signed)
Patient calling states she does not know what her visit was about today and doesn't see anything on her AVS  Her niece wants to know what was discussed at her visit.  Please call the patient at [phone]

## 2021-05-16 NOTE — Telephone Encounter (Signed)
Please advise. I do not see that the note from today is completed yet. Was this a follow up in regard to the lab work form last week in regard to the kidney disease and anemia?

## 2021-05-16 NOTE — Telephone Encounter (Signed)
She was seen for anemia f/up She went to the lab to check some vitamin levels

## 2021-05-16 NOTE — Progress Notes (Signed)
Subjective:  Patient ID: Britiany Silbernagel, female    DOB: Dec 31, 1932  Age: 87 y.o. MRN: [id number]  CC: Anemia  This visit occurred during the SARS-CoV-2 public health emergency.  Safety protocols were in place, including screening questions prior to the visit, additional usage of staff PPE, and extensive cleaning of exam room while observing appropriate contact time as indicated for disinfecting solutions.    HPI Jasara Corrigan presents for f/up - She returns for evaluation of anemia. She mentions intermittent episodes of BRBPR and melena but she says her stools are brown most of the time.  Outpatient Medications Prior to Visit  Medication Sig Dispense Refill  . FEROSUL 325 (65 Fe) MG tablet TAKE 1 TABLET(325 MG) BY MOUTH DAILY 30 tablet 0  . gabapentin (NEURONTIN) 800 MG tablet Take 1 tablet (800 mg total) by mouth at bedtime. 90 tablet 1  . hydrochlorothiazide (MICROZIDE) 12.5 MG capsule TAKE 1 CAPSULE 3 TIMES A WEEK 90 capsule 3  . hydrocortisone 2.5 % ointment APPLY EXTERNALLY TO THE AFFECTED AREA TWICE DAILY 28.35 g 0  . latanoprost (XALATAN) 0.005 % ophthalmic solution INT 1 GTT IN OD QD    . lidocaine (LIDODERM) 5 % Place 1 patch onto the skin daily as needed. Apply patch to area most significant pain once per day.  Remove and discard patch within 12 hours of application. 30 patch 1  . metoprolol succinate (TOPROL-XL) 50 MG 24 hr tablet TAKE 1 TABLET BY MOUTH EVERY DAY WITH OR IMMEDIATELY FOLLOWING A MEAL 90 tablet 1  . nystatin cream (MYCOSTATIN) Apply to external affected area twice a day for 10 days as needed. 30 g 1  . ondansetron (ZOFRAN ODT) 4 MG disintegrating tablet Take 1 tablet (4 mg total) by mouth every 8 (eight) hours as needed for nausea or vomiting. 30 tablet 2  . pantoprazole (PROTONIX) 40 MG tablet TAKE 1 TABLET(40 MG) BY MOUTH TWICE DAILY 180 tablet 1  . polyethylene glycol powder (GLYCOLAX/MIRALAX) 17 GM/SCOOP powder Take 17 g by mouth daily. 3350 g 2  . promethazine  (PHENERGAN) 12.5 MG tablet TAKE 1 TABLET BY MOUTH EVERY 6 HOURS AS NEEDED FOR NAUSEA OR VOMITING 30 tablet 6  . traMADol-acetaminophen (ULTRACET) 37.5-325 MG tablet Take 1 tablet by mouth every 6 (six) hours. 60 tablet 3  . verapamil (CALAN-SR) 240 MG CR tablet Take 1 tablet (240 mg total) by mouth daily. 90 tablet 3   No facility-administered medications prior to visit.    ROS Review of Systems  Constitutional: Negative for appetite change, diaphoresis, fatigue and fever.  HENT: Negative.   Eyes: Negative.   Respiratory: Negative for cough, chest tightness, shortness of breath and wheezing.   Cardiovascular: Negative for chest pain, palpitations and leg swelling.  Gastrointestinal: Positive for anal bleeding. Negative for abdominal pain, blood in stool, constipation, diarrhea, nausea, rectal pain and vomiting.  Endocrine: Negative.   Genitourinary: Negative.  Negative for difficulty urinating, dysuria and hematuria.  Musculoskeletal: Positive for back pain. Negative for arthralgias and myalgias.  Skin: Negative.  Negative for color change.  Neurological: Negative.  Negative for dizziness, weakness, light-headedness, numbness and headaches.  Hematological: Negative for adenopathy. Does not bruise/bleed easily.  Psychiatric/Behavioral: Negative.     Objective:  BP (!) 150/80 (BP Location: Right Arm, Patient Position: Sitting, Cuff Size: Normal)   Pulse 89   Temp 98.3 F (36.8 C) (Oral)   Ht 4\' 10"  (1.473 m)   Wt 121 lb (54.9 kg)   SpO2 96%  BMI 25.29 kg/m   BP Readings from Last 3 Encounters:  05/16/21 (!) 150/80  05/05/21 (!) 154/78  03/09/21 117/62    Wt Readings from Last 3 Encounters:  05/16/21 121 lb (54.9 kg)  05/05/21 121 lb (54.9 kg)  03/09/21 119 lb (54 kg)    Physical Exam Vitals reviewed. Exam conducted with a chaperone present (Shirron).  HENT:     Nose: Nose normal.     Mouth/Throat:     Mouth: Mucous membranes are moist.  Eyes:     General: No  scleral icterus.    Conjunctiva/sclera: Conjunctivae normal.  Cardiovascular:     Rate and Rhythm: Normal rate and regular rhythm.     Heart sounds: No murmur heard.   Pulmonary:     Effort: Pulmonary effort is normal.     Breath sounds: No stridor. No wheezing, rhonchi or rales.  Abdominal:     General: Abdomen is flat. Bowel sounds are normal. There is no distension.     Palpations: Abdomen is soft. There is no hepatomegaly, splenomegaly or mass.     Tenderness: There is no abdominal tenderness.  Genitourinary:    Rectum: Normal. Guaiac result negative. No mass, tenderness, anal fissure, external hemorrhoid or internal hemorrhoid. Normal anal tone.     Comments: Empty vault Musculoskeletal:        General: Normal range of motion.     Cervical back: Neck supple.  Lymphadenopathy:     Cervical: No cervical adenopathy.  Skin:    General: Skin is warm and dry.  Neurological:     General: No focal deficit present.     Lab Results  Component Value Date   WBC 6.8 05/16/2021   HGB 11.7 (L) 05/16/2021   HCT 34.9 (L) 05/16/2021   PLT 338.0 05/16/2021   GLUCOSE 91 05/05/2021   CHOL 175 11/03/2019   TRIG 79 11/03/2019   HDL 82 11/03/2019   LDLCALC 78 11/03/2019   ALT 10 05/05/2021   AST 15 05/05/2021   NA 144 05/05/2021   K 4.1 05/05/2021   CL 106 05/05/2021   CREATININE 0.96 05/05/2021   BUN 17 05/05/2021   CO2 31 05/05/2021   TSH 1.86 05/05/2021   HGBA1C 5.4 05/30/2017    CT Thoracic Spine Wo Contrast  Result Date: 12/11/2020 CLINICAL DATA:  Mid back pain EXAM: CT THORACIC AND LUMBAR SPINE WITHOUT CONTRAST TECHNIQUE: Multidetector CT imaging of the thoracic and lumbar spine was performed without contrast. Multiplanar CT image reconstructions were also generated. COMPARISON:  Cervical CT 03/18/2018 FINDINGS: CT THORACIC SPINE FINDINGS Alignment: Mild cervicothoracic curvature convex to the right. Mild thoracic curvature convex to the left. Vertebrae: No evidence of  fracture or focal bone lesion. Paraspinal and other soft tissues: Hiatal hernia. Disc levels: Degenerative disc disease throughout the thoracic region with disc space narrowing and vacuum phenomenon. Annular bulging to a minor degree from T6-7 through T11-12. No evidence of soft disc herniation or compressive stenosis of the canal or foramina. Ordinary mild facet osteoarthritis and costovertebral osteoarthritis. No advanced finding. CT LUMBAR SPINE FINDINGS Segmentation: 5 lumbar type vertebral bodies. Alignment: Thoracolumbar curvature convex to the left and lower lumbar curvature convex to the right. Vertebrae: No fracture or primary bone lesion. Chronic discogenic endplate changes with sclerosis and cyst formation. Paraspinal and other soft tissues: Aortic atherosclerosis without evidence of aneurysm. Disc levels: L1-2: Disc degeneration with vacuum phenomenon. Mild bulging of the disc. No compressive stenosis. L2-3: Disc degeneration of vacuum phenomenon. Endplate osteophytes and  mild bulging of the disc. Facet osteoarthritis. Right foraminal narrowing. L3-4: Disc degeneration with vacuum phenomenon. Endplate osteophytes and bulging of the disc. Facet osteoarthritis. No compressive stenosis. L4-5: Disc degeneration with loss of height and vacuum phenomenon. Small endplate osteophytes. Facet osteoarthritis. No compressive canal or foraminal narrowing. L5-S1: Disc degeneration with loss of disc height and vacuum phenomenon. Endplate osteophytes. Facet osteoarthritis. No apparent compressive stenosis. Bilateral sacroiliac osteoarthritis is present. IMPRESSION: Spinal curvature. Chronic degenerative disc disease throughout the thoracic and lumbar region, most pronounced in the lumbar region where there is advanced loss of disc height and vacuum phenomenon throughout. The findings in general could certainly relate to chronic back pain. There is no evidence of a compression fracture, either old or acute. The patient  does not appear to have compressive stenosis of the thoracic or lumbar spinal canal. There is facet osteoarthritis at the region which also could contribute to back pain. There is bilateral sacroiliac osteoarthritis which could be painful. Electronically Signed   By: Nelson Chimes M.D.   On: 12/11/2020 18:46   CT Lumbar Spine Wo Contrast  Result Date: 12/11/2020 CLINICAL DATA:  Mid back pain EXAM: CT THORACIC AND LUMBAR SPINE WITHOUT CONTRAST TECHNIQUE: Multidetector CT imaging of the thoracic and lumbar spine was performed without contrast. Multiplanar CT image reconstructions were also generated. COMPARISON:  Cervical CT 03/18/2018 FINDINGS: CT THORACIC SPINE FINDINGS Alignment: Mild cervicothoracic curvature convex to the right. Mild thoracic curvature convex to the left. Vertebrae: No evidence of fracture or focal bone lesion. Paraspinal and other soft tissues: Hiatal hernia. Disc levels: Degenerative disc disease throughout the thoracic region with disc space narrowing and vacuum phenomenon. Annular bulging to a minor degree from T6-7 through T11-12. No evidence of soft disc herniation or compressive stenosis of the canal or foramina. Ordinary mild facet osteoarthritis and costovertebral osteoarthritis. No advanced finding. CT LUMBAR SPINE FINDINGS Segmentation: 5 lumbar type vertebral bodies. Alignment: Thoracolumbar curvature convex to the left and lower lumbar curvature convex to the right. Vertebrae: No fracture or primary bone lesion. Chronic discogenic endplate changes with sclerosis and cyst formation. Paraspinal and other soft tissues: Aortic atherosclerosis without evidence of aneurysm. Disc levels: L1-2: Disc degeneration with vacuum phenomenon. Mild bulging of the disc. No compressive stenosis. L2-3: Disc degeneration of vacuum phenomenon. Endplate osteophytes and mild bulging of the disc. Facet osteoarthritis. Right foraminal narrowing. L3-4: Disc degeneration with vacuum phenomenon. Endplate  osteophytes and bulging of the disc. Facet osteoarthritis. No compressive stenosis. L4-5: Disc degeneration with loss of height and vacuum phenomenon. Small endplate osteophytes. Facet osteoarthritis. No compressive canal or foraminal narrowing. L5-S1: Disc degeneration with loss of disc height and vacuum phenomenon. Endplate osteophytes. Facet osteoarthritis. No apparent compressive stenosis. Bilateral sacroiliac osteoarthritis is present. IMPRESSION: Spinal curvature. Chronic degenerative disc disease throughout the thoracic and lumbar region, most pronounced in the lumbar region where there is advanced loss of disc height and vacuum phenomenon throughout. The findings in general could certainly relate to chronic back pain. There is no evidence of a compression fracture, either old or acute. The patient does not appear to have compressive stenosis of the thoracic or lumbar spinal canal. There is facet osteoarthritis at the region which also could contribute to back pain. There is bilateral sacroiliac osteoarthritis which could be painful. Electronically Signed   By: Nelson Chimes M.D.   On: 12/11/2020 18:46    Assessment & Plan:   Gabryella was seen today for anemia.  Diagnoses and all orders for this visit:  Deficiency  anemia- This is most likely the anemia of chronic disease and renal insufficiency.  I will screen her for vitamin deficiencies. -     CBC with Differential/Platelet; Future -     Vitamin B12; Future -     Iron; Future -     Reticulocytes; Future -     Folate; Future -     Ferritin; Future -     Vitamin B1; Future -     Vitamin B1 -     Ferritin -     Folate -     Reticulocytes -     Iron -     Vitamin B12 -     CBC with Differential/Platelet  Essential hypertension- Her blood pressure is adequately well controlled.   I am having [name] maintain her hydrocortisone, latanoprost, nystatin cream, lidocaine, polyethylene glycol powder, pantoprazole, promethazine,  ondansetron, verapamil, hydrochlorothiazide, FeroSul, gabapentin, metoprolol succinate, and traMADol-acetaminophen.  No orders of the defined types were placed in this encounter.    Follow-up: No follow-ups on file.  Scarlette Calico, MD

## 2021-05-17 ENCOUNTER — Encounter: Payer: Self-pay | Admitting: Internal Medicine

## 2021-05-17 LAB — IRON: Iron: 46 ug/dL (ref 42–145)

## 2021-05-17 NOTE — Telephone Encounter (Signed)
Called pt, LVM.   

## 2021-05-21 LAB — VITAMIN B1: Vitamin B1 (Thiamine): 56 nmol/L — ABNORMAL HIGH (ref 8–30)

## 2021-05-22 ENCOUNTER — Encounter: Payer: Self-pay | Admitting: Internal Medicine

## 2021-06-06 ENCOUNTER — Other Ambulatory Visit: Payer: Self-pay | Admitting: Internal Medicine

## 2021-06-06 ENCOUNTER — Telehealth: Payer: Self-pay | Admitting: Internal Medicine

## 2021-06-06 DIAGNOSIS — D539 Nutritional anemia, unspecified: Secondary | ICD-10-CM

## 2021-06-06 DIAGNOSIS — D508 Other iron deficiency anemias: Secondary | ICD-10-CM

## 2021-06-06 MED ORDER — FERROUS SULFATE 325 (65 FE) MG PO TABS: 325.0000 mg | ORAL_TABLET | Freq: Every day | ORAL | 1 refills | Status: DC

## 2021-06-06 NOTE — Telephone Encounter (Signed)
    Patient requesting refill FEROSUL 325 (65 Fe) MG tablet   Pharmacy [facility] [place], [place]

## 2021-06-14 ENCOUNTER — Ambulatory Visit: Payer: Medicare Other | Admitting: Podiatry

## 2021-06-24 ENCOUNTER — Telehealth: Payer: Self-pay | Admitting: Internal Medicine

## 2021-06-24 NOTE — Telephone Encounter (Signed)
Please return call to niece Holley Raring at [phone]

## 2021-06-24 NOTE — Telephone Encounter (Signed)
Patient's great niece [name] and niece [name] have questions regarding the referral to Nephrology. They are on the DPR and patient gave verbal consent to discuss it with them.   Please advise and call back Glenda at [phone]

## 2021-06-24 NOTE — Telephone Encounter (Signed)
I have spoke to [facility], she has been informed of pt's lab results.

## 2021-06-24 NOTE — Telephone Encounter (Signed)
Called pt's niece, Malachy Mood, LVM.

## 2021-06-25 ENCOUNTER — Other Ambulatory Visit: Payer: Self-pay | Admitting: Family Medicine

## 2021-06-25 DIAGNOSIS — M479 Spondylosis, unspecified: Secondary | ICD-10-CM

## 2021-06-28 ENCOUNTER — Ambulatory Visit (INDEPENDENT_AMBULATORY_CARE_PROVIDER_SITE_OTHER): Payer: Medicare Other | Admitting: Podiatry

## 2021-06-28 ENCOUNTER — Encounter: Payer: Self-pay | Admitting: Podiatry

## 2021-06-28 ENCOUNTER — Other Ambulatory Visit: Payer: Self-pay

## 2021-06-28 DIAGNOSIS — M2012 Hallux valgus (acquired), left foot: Secondary | ICD-10-CM | POA: Diagnosis not present

## 2021-06-28 DIAGNOSIS — R609 Edema, unspecified: Secondary | ICD-10-CM

## 2021-06-28 DIAGNOSIS — B351 Tinea unguium: Secondary | ICD-10-CM

## 2021-06-28 DIAGNOSIS — M21612 Bunion of left foot: Secondary | ICD-10-CM

## 2021-06-28 DIAGNOSIS — M2011 Hallux valgus (acquired), right foot: Secondary | ICD-10-CM

## 2021-06-28 DIAGNOSIS — M2042 Other hammer toe(s) (acquired), left foot: Secondary | ICD-10-CM | POA: Diagnosis not present

## 2021-06-28 DIAGNOSIS — M79676 Pain in unspecified toe(s): Secondary | ICD-10-CM | POA: Diagnosis not present

## 2021-06-28 DIAGNOSIS — M21611 Bunion of right foot: Secondary | ICD-10-CM | POA: Diagnosis not present

## 2021-06-28 DIAGNOSIS — M2041 Other hammer toe(s) (acquired), right foot: Secondary | ICD-10-CM

## 2021-06-28 NOTE — Patient Instructions (Signed)

## 2021-07-02 NOTE — Progress Notes (Signed)
  Subjective:  Patient ID: [name], female    DOB: 1933/03/02,  MRN: [id number]  Chief Complaint  Patient presents with   Nail Problem    Thick painful toenails   Hammer Toe   Foot Swelling    87 y.o. female presents with the above complaint. History confirmed with patient.   Objective:  Physical Exam: warm, good capillary refill, no trophic changes or ulcerative lesions, normal DP and PT pulses, normal sensory exam, and onychomycosis.  +1 pitting edema bilateral lower extremity symmetric Left Foot: dystrophic yellowed discolored nail plates with subungual debris Right Foot: dystrophic yellowed discolored nail plates with subungual debris   Assessment:   1. Pain due to onychomycosis of toenail   2. Peripheral edema   3. Hallux valgus with bunions, left   4. Hallux valgus with bunions, right   5. Hammertoe of left foot   6. Hammertoe of right foot      Plan:  Patient was evaluated and treated and all questions answered.  Discussed the etiology and treatment options for the condition in detail with the patient. Educated patient on the topical and oral treatment options for mycotic nails. Recommended debridement of the nails today. Sharp and mechanical debridement performed of all painful and mycotic nails today. Nails debrided in length and thickness using a nail nipper to level of comfort. Discussed treatment options including appropriate shoe gear. Follow up as needed for painful nails.  Discussed treatment for peripheral edema including compression stockings and elevation and remaining active as best she can.  Return in about 3 months (around 09/28/2021) for at risk diabetic foot care.

## 2021-07-05 DIAGNOSIS — N1831 Chronic kidney disease, stage 3a: Secondary | ICD-10-CM | POA: Diagnosis not present

## 2021-07-05 DIAGNOSIS — I129 Hypertensive chronic kidney disease with stage 1 through stage 4 chronic kidney disease, or unspecified chronic kidney disease: Secondary | ICD-10-CM | POA: Diagnosis not present

## 2021-07-05 DIAGNOSIS — K2270 Barrett's esophagus without dysplasia: Secondary | ICD-10-CM | POA: Diagnosis not present

## 2021-07-05 DIAGNOSIS — N1832 Chronic kidney disease, stage 3b: Secondary | ICD-10-CM | POA: Diagnosis not present

## 2021-07-07 DIAGNOSIS — M7918 Myalgia, other site: Secondary | ICD-10-CM | POA: Insufficient documentation

## 2021-07-07 DIAGNOSIS — M545 Low back pain, unspecified: Secondary | ICD-10-CM | POA: Diagnosis not present

## 2021-07-07 DIAGNOSIS — M542 Cervicalgia: Secondary | ICD-10-CM | POA: Diagnosis not present

## 2021-07-12 DIAGNOSIS — Z1231 Encounter for screening mammogram for malignant neoplasm of breast: Secondary | ICD-10-CM | POA: Diagnosis not present

## 2021-07-12 LAB — HM MAMMOGRAPHY

## 2021-07-13 ENCOUNTER — Encounter: Payer: Self-pay | Admitting: Internal Medicine

## 2021-07-13 ENCOUNTER — Ambulatory Visit (INDEPENDENT_AMBULATORY_CARE_PROVIDER_SITE_OTHER): Payer: Medicare Other | Admitting: Internal Medicine

## 2021-07-13 ENCOUNTER — Other Ambulatory Visit: Payer: Self-pay

## 2021-07-13 VITALS — BP 142/70 | HR 68 | Temp 98.1°F | Ht <= 58 in | Wt 116.8 lb

## 2021-07-13 DIAGNOSIS — I10 Essential (primary) hypertension: Secondary | ICD-10-CM

## 2021-07-13 NOTE — Progress Notes (Signed)
Subjective:  Patient ID: [name], female    DOB: 1933/07/03  Age: 87 y.o. MRN: [id number]  CC: Anemia and Hypertension  This visit occurred during the SARS-CoV-2 public health emergency.  Safety protocols were in place, including screening questions prior to the visit, additional usage of staff PPE, and extensive cleaning of exam room while observing appropriate contact time as indicated for disinfecting solutions.    HPI [name] presents for f/up -  She tells me her blood pressure has been well controlled.  She denies headache, blurred vision, chest pain, shortness of breath, or edema.  Outpatient Medications Prior to Visit  Medication Sig Dispense Refill   ferrous sulfate (FEROSUL) 325 (65 FE) MG tablet Take 1 tablet (325 mg total) by mouth daily with breakfast. 90 tablet 1   gabapentin (NEURONTIN) 800 MG tablet Take 1 tablet (800 mg total) by mouth at bedtime. 90 tablet 1   hydrochlorothiazide (MICROZIDE) 12.5 MG capsule TAKE 1 CAPSULE 3 TIMES A WEEK 90 capsule 3   hydrocortisone 2.5 % ointment APPLY EXTERNALLY TO THE AFFECTED AREA TWICE DAILY 28.35 g 0   latanoprost (XALATAN) 0.005 % ophthalmic solution INT 1 GTT IN OD QD     lidocaine (LIDODERM) 5 % Place 1 patch onto the skin daily as needed. Apply patch to area most significant pain once per day.  Remove and discard patch within 12 hours of application. 30 patch 1   metoprolol succinate (TOPROL-XL) 50 MG 24 hr tablet TAKE 1 TABLET BY MOUTH EVERY DAY WITH OR IMMEDIATELY FOLLOWING A MEAL 90 tablet 1   nystatin cream (MYCOSTATIN) Apply to external affected area twice a day for 10 days as needed. 30 g 1   ondansetron (ZOFRAN ODT) 4 MG disintegrating tablet Take 1 tablet (4 mg total) by mouth every 8 (eight) hours as needed for nausea or vomiting. 30 tablet 2   pantoprazole (PROTONIX) 40 MG tablet TAKE 1 TABLET(40 MG) BY MOUTH TWICE DAILY 180 tablet 1   polyethylene glycol powder (GLYCOLAX/MIRALAX) 17 GM/SCOOP powder Take 17 g by  mouth daily. 3350 g 2   promethazine (PHENERGAN) 12.5 MG tablet TAKE 1 TABLET BY MOUTH EVERY 6 HOURS AS NEEDED FOR NAUSEA OR VOMITING 30 tablet 6   traMADol-acetaminophen (ULTRACET) 37.5-325 MG tablet Take 1 tablet by mouth every 6 (six) hours. 60 tablet 3   verapamil (CALAN-SR) 240 MG CR tablet Take 1 tablet (240 mg total) by mouth daily. 90 tablet 3   No facility-administered medications prior to visit.    ROS Review of Systems  Constitutional:  Negative for diaphoresis and fatigue.  HENT: Negative.    Eyes: Negative.   Respiratory:  Negative for cough, chest tightness, shortness of breath and wheezing.   Cardiovascular:  Negative for chest pain, palpitations and leg swelling.  Gastrointestinal:  Negative for abdominal pain, constipation and diarrhea.  Endocrine: Negative.   Genitourinary: Negative.  Negative for difficulty urinating.  Musculoskeletal:  Positive for gait problem.  Neurological:  Negative for dizziness, weakness and light-headedness.  Hematological: Negative.   Psychiatric/Behavioral: Negative.     Objective:  BP (!) 142/70 (BP Location: Left Arm, Patient Position: Sitting, Cuff Size: Large)   Pulse 68   Temp 98.1 F (36.7 C) (Oral)   Ht 4\' 10"  (1.473 m)   Wt 116 lb 12.8 oz (53 kg)   SpO2 95%   BMI 24.41 kg/m   BP Readings from Last 3 Encounters:  07/13/21 (!) 142/70  05/16/21 (!) 150/80  05/05/21 (!) 154/78  Wt Readings from Last 3 Encounters:  07/13/21 116 lb 12.8 oz (53 kg)  05/16/21 121 lb (54.9 kg)  05/05/21 121 lb (54.9 kg)    Physical Exam Vitals reviewed.  Constitutional:      Appearance: She is ill-appearing (in a wheelchair).  HENT:     Mouth/Throat:     Mouth: Mucous membranes are moist.  Eyes:     Conjunctiva/sclera: Conjunctivae normal.  Cardiovascular:     Rate and Rhythm: Normal rate and regular rhythm.     Heart sounds: No murmur heard. Pulmonary:     Effort: Pulmonary effort is normal.     Breath sounds: No stridor. No  wheezing, rhonchi or rales.  Abdominal:     General: Abdomen is flat. Bowel sounds are normal. There is no distension.     Palpations: Abdomen is soft. There is no hepatomegaly, splenomegaly or mass.  Musculoskeletal:        General: Normal range of motion.     Cervical back: Neck supple.     Right lower leg: No edema.     Left lower leg: No edema.  Lymphadenopathy:     Cervical: No cervical adenopathy.  Skin:    General: Skin is warm and dry.  Neurological:     General: No focal deficit present.     Mental Status: She is alert.    Lab Results  Component Value Date   WBC 6.8 05/16/2021   HGB 11.7 (L) 05/16/2021   HCT 34.9 (L) 05/16/2021   PLT 338.0 05/16/2021   GLUCOSE 91 05/05/2021   CHOL 175 11/03/2019   TRIG 79 11/03/2019   HDL 82 11/03/2019   LDLCALC 78 11/03/2019   ALT 10 05/05/2021   AST 15 05/05/2021   NA 144 05/05/2021   K 4.1 05/05/2021   CL 106 05/05/2021   CREATININE 0.96 05/05/2021   BUN 17 05/05/2021   CO2 31 05/05/2021   TSH 1.86 05/05/2021   HGBA1C 5.4 05/30/2017    CT Thoracic Spine Wo Contrast  Result Date: 12/11/2020 CLINICAL DATA:  Mid back pain EXAM: CT THORACIC AND LUMBAR SPINE WITHOUT CONTRAST TECHNIQUE: Multidetector CT imaging of the thoracic and lumbar spine was performed without contrast. Multiplanar CT image reconstructions were also generated. COMPARISON:  Cervical CT 03/18/2018 FINDINGS: CT THORACIC SPINE FINDINGS Alignment: Mild cervicothoracic curvature convex to the right. Mild thoracic curvature convex to the left. Vertebrae: No evidence of fracture or focal bone lesion. Paraspinal and other soft tissues: Hiatal hernia. Disc levels: Degenerative disc disease throughout the thoracic region with disc space narrowing and vacuum phenomenon. Annular bulging to a minor degree from T6-7 through T11-12. No evidence of soft disc herniation or compressive stenosis of the canal or foramina. Ordinary mild facet osteoarthritis and costovertebral  osteoarthritis. No advanced finding. CT LUMBAR SPINE FINDINGS Segmentation: 5 lumbar type vertebral bodies. Alignment: Thoracolumbar curvature convex to the left and lower lumbar curvature convex to the right. Vertebrae: No fracture or primary bone lesion. Chronic discogenic endplate changes with sclerosis and cyst formation. Paraspinal and other soft tissues: Aortic atherosclerosis without evidence of aneurysm. Disc levels: L1-2: Disc degeneration with vacuum phenomenon. Mild bulging of the disc. No compressive stenosis. L2-3: Disc degeneration of vacuum phenomenon. Endplate osteophytes and mild bulging of the disc. Facet osteoarthritis. Right foraminal narrowing. L3-4: Disc degeneration with vacuum phenomenon. Endplate osteophytes and bulging of the disc. Facet osteoarthritis. No compressive stenosis. L4-5: Disc degeneration with loss of height and vacuum phenomenon. Small endplate osteophytes. Facet osteoarthritis. No compressive  canal or foraminal narrowing. L5-S1: Disc degeneration with loss of disc height and vacuum phenomenon. Endplate osteophytes. Facet osteoarthritis. No apparent compressive stenosis. Bilateral sacroiliac osteoarthritis is present. IMPRESSION: Spinal curvature. Chronic degenerative disc disease throughout the thoracic and lumbar region, most pronounced in the lumbar region where there is advanced loss of disc height and vacuum phenomenon throughout. The findings in general could certainly relate to chronic back pain. There is no evidence of a compression fracture, either old or acute. The patient does not appear to have compressive stenosis of the thoracic or lumbar spinal canal. There is facet osteoarthritis at the region which also could contribute to back pain. There is bilateral sacroiliac osteoarthritis which could be painful. Electronically Signed   By: Nelson Chimes M.D.   On: 12/11/2020 18:46   CT Lumbar Spine Wo Contrast  Result Date: 12/11/2020 CLINICAL DATA:  Mid back pain  EXAM: CT THORACIC AND LUMBAR SPINE WITHOUT CONTRAST TECHNIQUE: Multidetector CT imaging of the thoracic and lumbar spine was performed without contrast. Multiplanar CT image reconstructions were also generated. COMPARISON:  Cervical CT 03/18/2018 FINDINGS: CT THORACIC SPINE FINDINGS Alignment: Mild cervicothoracic curvature convex to the right. Mild thoracic curvature convex to the left. Vertebrae: No evidence of fracture or focal bone lesion. Paraspinal and other soft tissues: Hiatal hernia. Disc levels: Degenerative disc disease throughout the thoracic region with disc space narrowing and vacuum phenomenon. Annular bulging to a minor degree from T6-7 through T11-12. No evidence of soft disc herniation or compressive stenosis of the canal or foramina. Ordinary mild facet osteoarthritis and costovertebral osteoarthritis. No advanced finding. CT LUMBAR SPINE FINDINGS Segmentation: 5 lumbar type vertebral bodies. Alignment: Thoracolumbar curvature convex to the left and lower lumbar curvature convex to the right. Vertebrae: No fracture or primary bone lesion. Chronic discogenic endplate changes with sclerosis and cyst formation. Paraspinal and other soft tissues: Aortic atherosclerosis without evidence of aneurysm. Disc levels: L1-2: Disc degeneration with vacuum phenomenon. Mild bulging of the disc. No compressive stenosis. L2-3: Disc degeneration of vacuum phenomenon. Endplate osteophytes and mild bulging of the disc. Facet osteoarthritis. Right foraminal narrowing. L3-4: Disc degeneration with vacuum phenomenon. Endplate osteophytes and bulging of the disc. Facet osteoarthritis. No compressive stenosis. L4-5: Disc degeneration with loss of height and vacuum phenomenon. Small endplate osteophytes. Facet osteoarthritis. No compressive canal or foraminal narrowing. L5-S1: Disc degeneration with loss of disc height and vacuum phenomenon. Endplate osteophytes. Facet osteoarthritis. No apparent compressive stenosis.  Bilateral sacroiliac osteoarthritis is present. IMPRESSION: Spinal curvature. Chronic degenerative disc disease throughout the thoracic and lumbar region, most pronounced in the lumbar region where there is advanced loss of disc height and vacuum phenomenon throughout. The findings in general could certainly relate to chronic back pain. There is no evidence of a compression fracture, either old or acute. The patient does not appear to have compressive stenosis of the thoracic or lumbar spinal canal. There is facet osteoarthritis at the region which also could contribute to back pain. There is bilateral sacroiliac osteoarthritis which could be painful. Electronically Signed   By: Nelson Chimes M.D.   On: 12/11/2020 18:46    Assessment & Plan:   Cannon was seen today for anemia and hypertension.  Diagnoses and all orders for this visit:  Essential hypertension- Her blood pressure is adequately well controlled.  Will continue hydrochlorothiazide and metoprolol.  I am having [name] maintain her hydrocortisone, latanoprost, nystatin cream, lidocaine, polyethylene glycol powder, pantoprazole, promethazine, ondansetron, verapamil, hydrochlorothiazide, gabapentin, metoprolol succinate, traMADol-acetaminophen, and ferrous sulfate.  No orders of the defined types were placed in this encounter.    Follow-up: Return in about 6 months (around 01/13/2022).  Scarlette Calico, MD

## 2021-07-13 NOTE — Patient Instructions (Signed)

## 2021-07-22 DIAGNOSIS — H1131 Conjunctival hemorrhage, right eye: Secondary | ICD-10-CM | POA: Diagnosis not present

## 2021-07-25 ENCOUNTER — Telehealth: Payer: Self-pay

## 2021-07-25 NOTE — Telephone Encounter (Signed)
error 

## 2021-09-13 ENCOUNTER — Ambulatory Visit (INDEPENDENT_AMBULATORY_CARE_PROVIDER_SITE_OTHER): Payer: Medicare Other | Admitting: Sports Medicine

## 2021-09-13 ENCOUNTER — Ambulatory Visit (INDEPENDENT_AMBULATORY_CARE_PROVIDER_SITE_OTHER): Payer: Medicare Other

## 2021-09-13 ENCOUNTER — Other Ambulatory Visit: Payer: Self-pay

## 2021-09-13 VITALS — BP 102/64 | HR 69 | Ht <= 58 in

## 2021-09-13 DIAGNOSIS — M5136 Other intervertebral disc degeneration, lumbar region: Secondary | ICD-10-CM

## 2021-09-13 DIAGNOSIS — M545 Low back pain, unspecified: Secondary | ICD-10-CM | POA: Diagnosis not present

## 2021-09-13 MED ORDER — METHYLPREDNISOLONE ACETATE 40 MG/ML IJ SUSP
40.0000 mg | Freq: Once | INTRAMUSCULAR | Status: AC
  Administered 2021-09-13: 40 mg via INTRAMUSCULAR

## 2021-09-13 MED ORDER — KETOROLAC TROMETHAMINE 30 MG/ML IJ SOLN
30.0000 mg | Freq: Once | INTRAMUSCULAR | Status: AC
  Administered 2021-09-13: 30 mg via INTRAMUSCULAR

## 2021-09-13 NOTE — Progress Notes (Signed)
Benito Mccreedy D.[place] [place] Phone: [phone]   Assessment and Plan:      1. Degenerative disc disease, lumbar 2. Acute right-sided low back pain without sciatica -Acute on chronic, initial sports medicine visit - Likely an acute strain with underlying DDD, lumbar scoliosis - Due to comorbidities, would not recommend prolonged NSAID, steroid use, but patient agrees to a one-time IM injection of Toradol/Depo-Medrol.  Provided in clinic - Use Tylenol 1000 mg 3 times daily for pain control instead of controlled medication as Ultracet may cause dizziness or lead to falls -X-ray obtained in clinic due to acute pain and patient age.  My interpretation: Chronic appearing severe DDD with lumbar scoliosis.  No acute appearing fractures or disc collapse when compared to CT images from 11/2020 - DG Lumbar Spine 2-3 Views; Future  Pertinent previous records reviewed include previous CT lumbar spine, previous CT thoracic spine, previous lab work including BMP   Follow Up: In 2 weeks if no improvement or worsening of symptoms   Subjective:   I, Peterson Lombard, PhD, LAT, ATC, am acting as a Education administrator for [employer], DO, CAQSM.  Chief Complaint: Back pain  HPI: Pt is an 87 y/o female c/o back pain, acute on chronic over the past 3 days. Of note, pt was seen at the [facility] ED on 12/11/20 c/o bilat back pain. Pt notes she has had prior "injections" in her back. Pt locates pain to mostly the R side of her low back.  Radiating pain: no LE numbness/tingling: no LE weakness: no Treatments tried: tramadol, patches, aspercream  Dx imaging: 12/11/20 T-spine & L-spine CT  Relevant Historical Information: Hypertension, CAD, CKD stage IIIb, osteoporosis, dementia  Additional pertinent review of systems negative.  Current Outpatient Medications  Medication Sig Dispense Refill   ferrous sulfate (FEROSUL) 325 (65 FE) MG tablet Take 1  tablet (325 mg total) by mouth daily with breakfast. 90 tablet 1   gabapentin (NEURONTIN) 800 MG tablet Take 1 tablet (800 mg total) by mouth at bedtime. 90 tablet 1   hydrochlorothiazide (MICROZIDE) 12.5 MG capsule TAKE 1 CAPSULE 3 TIMES A WEEK 90 capsule 3   hydrocortisone 2.5 % ointment APPLY EXTERNALLY TO THE AFFECTED AREA TWICE DAILY 28.35 g 0   latanoprost (XALATAN) 0.005 % ophthalmic solution INT 1 GTT IN OD QD     lidocaine (LIDODERM) 5 % Place 1 patch onto the skin daily as needed. Apply patch to area most significant pain once per day.  Remove and discard patch within 12 hours of application. 30 patch 1   metoprolol succinate (TOPROL-XL) 50 MG 24 hr tablet TAKE 1 TABLET BY MOUTH EVERY DAY WITH OR IMMEDIATELY FOLLOWING A MEAL 90 tablet 1   nystatin cream (MYCOSTATIN) Apply to external affected area twice a day for 10 days as needed. 30 g 1   ondansetron (ZOFRAN ODT) 4 MG disintegrating tablet Take 1 tablet (4 mg total) by mouth every 8 (eight) hours as needed for nausea or vomiting. 30 tablet 2   pantoprazole (PROTONIX) 40 MG tablet TAKE 1 TABLET(40 MG) BY MOUTH TWICE DAILY 180 tablet 1   polyethylene glycol powder (GLYCOLAX/MIRALAX) 17 GM/SCOOP powder Take 17 g by mouth daily. 3350 g 2   promethazine (PHENERGAN) 12.5 MG tablet TAKE 1 TABLET BY MOUTH EVERY 6 HOURS AS NEEDED FOR NAUSEA OR VOMITING 30 tablet 6   traMADol-acetaminophen (ULTRACET) 37.5-325 MG tablet Take 1 tablet by mouth every 6 (six)  hours. 60 tablet 3   verapamil (CALAN-SR) 240 MG CR tablet Take 1 tablet (240 mg total) by mouth daily. 90 tablet 3   No current facility-administered medications for this visit.      Objective:     Vitals:   09/13/21 1352  BP: 102/64  Pulse: 69  SpO2: 96%  Height: 4\' 10"  (1.473 m)      Body mass index is 24.41 kg/m.    Physical Exam:     Gen: Appears well, nad, nontoxic and pleasant Psych: Alert and oriented, appropriate mood and affect Neuro: sensation intact, strength is 5/5  in upper and lower extremities, muscle tone wnl Skin: no susupicious lesions or rashes  Back - Normal skin, Spine with kyphosis and thoracic spine and scoliosis in the thoracolumbar junction in the lumbar spine, consistent with CT images from 11/2020 No tenderness to vertebral process palpation.   Paraspinous muscles are tender on right and without spasm Lhermitte's negative   Electronically signed by:  Benito Mccreedy D.[facility] 2:36 PM 09/13/21

## 2021-09-13 NOTE — Addendum Note (Signed)
Addended by: Pollyann Glen on: 09/13/2021 02:50 PM   Modules accepted: Orders

## 2021-09-13 NOTE — Patient Instructions (Addendum)
Good to see you today!  Rest and no sweep at least until this weekend  Take Tylenol 1000 mg 3 times a day for pain instead of Ultracet (Tramadol- acetaminophen) as tramadol could cause dizziness or lead to falls  See me again in 2 weeks if no improvement.

## 2021-09-21 DIAGNOSIS — Z961 Presence of intraocular lens: Secondary | ICD-10-CM | POA: Diagnosis not present

## 2021-09-21 DIAGNOSIS — H1045 Other chronic allergic conjunctivitis: Secondary | ICD-10-CM | POA: Diagnosis not present

## 2021-09-21 DIAGNOSIS — H401131 Primary open-angle glaucoma, bilateral, mild stage: Secondary | ICD-10-CM | POA: Diagnosis not present

## 2021-09-21 DIAGNOSIS — H04123 Dry eye syndrome of bilateral lacrimal glands: Secondary | ICD-10-CM | POA: Diagnosis not present

## 2021-09-28 ENCOUNTER — Ambulatory Visit (INDEPENDENT_AMBULATORY_CARE_PROVIDER_SITE_OTHER): Payer: Medicare Other | Admitting: Sports Medicine

## 2021-09-28 ENCOUNTER — Other Ambulatory Visit: Payer: Self-pay

## 2021-09-28 VITALS — BP 100/60 | HR 102 | Ht <= 58 in

## 2021-09-28 DIAGNOSIS — M5136 Other intervertebral disc degeneration, lumbar region: Secondary | ICD-10-CM

## 2021-09-28 DIAGNOSIS — M545 Low back pain, unspecified: Secondary | ICD-10-CM

## 2021-09-28 NOTE — Patient Instructions (Addendum)
Good to see you  Tylenol 500mg  (2 tablets total of 1000mg ) 3 times a day as needed for pain  See me again as needed

## 2021-09-28 NOTE — Progress Notes (Addendum)
[name] D.[place] Phone: [phone]   Assessment and Plan:    1. Degenerative disc disease, lumbar 2. Acute right-sided low back pain without sciatica -Acute on chronic, improved symptoms, subsequent sports medicine visit - Generalized improvement in symptoms after Toradol and steroid injection at previous visit - Recommend discontinuing prescription pain medications as these can lead to drowsiness, AMS, falls - Recommend starting Tylenol 1000 mg 3 times daily as needed for pain control   Pertinent previous records reviewed include previous office note   Follow Up: As needed   Subjective:   I, [name], am serving as a scribe for Dr. Glennon Mac  Chief Complaint: Low back pain follow up   HPI:   09/13/21 Pt is an 87 y/o female c/o back pain, acute on chronic over the past 3 days. Of note, pt was seen at the [facility] ED on 12/11/20 c/o bilat back pain. Pt notes she has had prior "injections" in her back. Pt locates pain to mostly the R side of her low back.   Radiating pain: no LE numbness/tingling: no LE weakness: no Treatments tried: tramadol, patches, aspercream  10/05/21 Patient states that the injection at the last visit has helped.   Relevant Historical Information: Hypertension, CAD, CKD stage IIIb, osteoporosis, dementia  Additional pertinent review of systems negative.   Current Outpatient Medications:    ferrous sulfate (FEROSUL) 325 (65 FE) MG tablet, Take 1 tablet (325 mg total) by mouth daily with breakfast., Disp: 90 tablet, Rfl: 1   gabapentin (NEURONTIN) 800 MG tablet, Take 1 tablet (800 mg total) by mouth at bedtime., Disp: 90 tablet, Rfl: 1   hydrochlorothiazide (MICROZIDE) 12.5 MG capsule, TAKE 1 CAPSULE 3 TIMES A WEEK, Disp: 90 capsule, Rfl: 3   hydrocortisone 2.5 % ointment, APPLY EXTERNALLY TO THE AFFECTED AREA TWICE DAILY, Disp: 28.35 g, Rfl: 0    latanoprost (XALATAN) 0.005 % ophthalmic solution, INT 1 GTT IN OD QD, Disp: , Rfl:    lidocaine (LIDODERM) 5 %, Place 1 patch onto the skin daily as needed. Apply patch to area most significant pain once per day.  Remove and discard patch within 12 hours of application., Disp: 30 patch, Rfl: 1   metoprolol succinate (TOPROL-XL) 50 MG 24 hr tablet, TAKE 1 TABLET BY MOUTH EVERY DAY WITH OR IMMEDIATELY FOLLOWING A MEAL, Disp: 90 tablet, Rfl: 1   nystatin cream (MYCOSTATIN), Apply to external affected area twice a day for 10 days as needed., Disp: 30 g, Rfl: 1   ondansetron (ZOFRAN ODT) 4 MG disintegrating tablet, Take 1 tablet (4 mg total) by mouth every 8 (eight) hours as needed for nausea or vomiting., Disp: 30 tablet, Rfl: 2   pantoprazole (PROTONIX) 40 MG tablet, TAKE 1 TABLET(40 MG) BY MOUTH TWICE DAILY, Disp: 180 tablet, Rfl: 1   polyethylene glycol powder (GLYCOLAX/MIRALAX) 17 GM/SCOOP powder, Take 17 g by mouth daily., Disp: 3350 g, Rfl: 2   promethazine (PHENERGAN) 12.5 MG tablet, TAKE 1 TABLET BY MOUTH EVERY 6 HOURS AS NEEDED FOR NAUSEA OR VOMITING, Disp: 30 tablet, Rfl: 6   traMADol-acetaminophen (ULTRACET) 37.5-325 MG tablet, Take 1 tablet by mouth every 6 (six) hours., Disp: 60 tablet, Rfl: 3   verapamil (CALAN-SR) 240 MG CR tablet, Take 1 tablet (240 mg total) by mouth daily., Disp: 90 tablet, Rfl: 3   Objective:     Vitals:   09/28/21 1306  BP: 100/60  Pulse: Marland Kitchen)  102  SpO2: 96%  Height: 4\' 10"  (1.473 m)      Body mass index is 24.41 kg/m.    Physical Exam:    Gen: Appears well, nad, nontoxic and pleasant Psych: Alert and oriented, appropriate mood and affect Neuro: sensation intact, strength is 5/5 in upper and lower extremities, muscle tone wnl Skin: no susupicious lesions or rashes   Back - Normal skin, Spine with kyphosis and thoracic spine and scoliosis in the thoracolumbar junction in the lumbar spine, consistent with CT images from 11/2020 No tenderness to vertebral  process palpation.   Paraspinous muscles are nontender Lhermitte's negative   Electronically signed by:  [name] D.[name] 1:05 PM 10/05/21

## 2021-09-30 DIAGNOSIS — Z23 Encounter for immunization: Secondary | ICD-10-CM | POA: Diagnosis not present

## 2021-10-04 ENCOUNTER — Encounter: Payer: Self-pay | Admitting: Podiatry

## 2021-10-04 ENCOUNTER — Ambulatory Visit (INDEPENDENT_AMBULATORY_CARE_PROVIDER_SITE_OTHER): Payer: Medicare Other | Admitting: Podiatry

## 2021-10-04 ENCOUNTER — Other Ambulatory Visit: Payer: Self-pay

## 2021-10-04 DIAGNOSIS — M79676 Pain in unspecified toe(s): Secondary | ICD-10-CM | POA: Diagnosis not present

## 2021-10-04 DIAGNOSIS — B351 Tinea unguium: Secondary | ICD-10-CM | POA: Diagnosis not present

## 2021-10-05 ENCOUNTER — Ambulatory Visit (INDEPENDENT_AMBULATORY_CARE_PROVIDER_SITE_OTHER): Payer: Medicare Other

## 2021-10-05 ENCOUNTER — Ambulatory Visit (INDEPENDENT_AMBULATORY_CARE_PROVIDER_SITE_OTHER): Payer: Medicare Other | Admitting: Sports Medicine

## 2021-10-05 VITALS — BP 120/74 | HR 101 | Ht <= 58 in

## 2021-10-05 DIAGNOSIS — M7918 Myalgia, other site: Secondary | ICD-10-CM | POA: Diagnosis not present

## 2021-10-05 DIAGNOSIS — S31805A Open bite of unspecified buttock, initial encounter: Secondary | ICD-10-CM | POA: Diagnosis not present

## 2021-10-05 DIAGNOSIS — W540XXA Bitten by dog, initial encounter: Secondary | ICD-10-CM | POA: Diagnosis not present

## 2021-10-05 DIAGNOSIS — R102 Pelvic and perineal pain: Secondary | ICD-10-CM | POA: Diagnosis not present

## 2021-10-05 NOTE — Progress Notes (Signed)
[name] D.[place] Phone: [phone]   Assessment and Plan:     1. Acute buttock pain -Acute, uncomplicated, initial sports medicine visit - Patient fell backwards on her buttocks after being scared by dog - X-ray obtained in clinic.  My interpretation: No acute fracture.  Chronic appearing degenerative changes of bilateral hip joints.  Similar when compared to x-ray from 10/2020 - Continue use of Tylenol as needed for pain control -I reiterated that I would recommend discontinuing tramadol as this could potentially lead to altered mental status in the fall - DG Pelvis 1-2 Views; Future    Pertinent previous records reviewed include none pertinent   Follow Up: As needed no improvement or worsening of symptoms   Subjective:   I, [name], am serving as a scribe for Dr. Glennon Mac  Chief Complaint: Buttock pain   HPI:   10/05/21 Patient is a 87 year old female presenting with Buttock pain from a attempted dog attack. Patient fell back onto her buttock when trying to shew the dog away with her cane. Patient states that her left side is more painful than the right.  Relevant Historical Information: Hypertension, CAD, CKD stage IIIb, osteoporosis, dementia  Additional pertinent review of systems negative.   Current Outpatient Medications:    ferrous sulfate (FEROSUL) 325 (65 FE) MG tablet, Take 1 tablet (325 mg total) by mouth daily with breakfast., Disp: 90 tablet, Rfl: 1   gabapentin (NEURONTIN) 800 MG tablet, Take 1 tablet (800 mg total) by mouth at bedtime., Disp: 90 tablet, Rfl: 1   hydrochlorothiazide (MICROZIDE) 12.5 MG capsule, TAKE 1 CAPSULE 3 TIMES A WEEK, Disp: 90 capsule, Rfl: 3   hydrocortisone 2.5 % ointment, APPLY EXTERNALLY TO THE AFFECTED AREA TWICE DAILY, Disp: 28.35 g, Rfl: 0   latanoprost (XALATAN) 0.005 % ophthalmic solution, INT 1 GTT IN OD QD, Disp: , Rfl:     lidocaine (LIDODERM) 5 %, Place 1 patch onto the skin daily as needed. Apply patch to area most significant pain once per day.  Remove and discard patch within 12 hours of application., Disp: 30 patch, Rfl: 1   metoprolol succinate (TOPROL-XL) 50 MG 24 hr tablet, TAKE 1 TABLET BY MOUTH EVERY DAY WITH OR IMMEDIATELY FOLLOWING A MEAL, Disp: 90 tablet, Rfl: 1   nystatin cream (MYCOSTATIN), Apply to external affected area twice a day for 10 days as needed., Disp: 30 g, Rfl: 1   ondansetron (ZOFRAN ODT) 4 MG disintegrating tablet, Take 1 tablet (4 mg total) by mouth every 8 (eight) hours as needed for nausea or vomiting., Disp: 30 tablet, Rfl: 2   pantoprazole (PROTONIX) 40 MG tablet, TAKE 1 TABLET(40 MG) BY MOUTH TWICE DAILY, Disp: 180 tablet, Rfl: 1   polyethylene glycol powder (GLYCOLAX/MIRALAX) 17 GM/SCOOP powder, Take 17 g by mouth daily., Disp: 3350 g, Rfl: 2   promethazine (PHENERGAN) 12.5 MG tablet, TAKE 1 TABLET BY MOUTH EVERY 6 HOURS AS NEEDED FOR NAUSEA OR VOMITING, Disp: 30 tablet, Rfl: 6   traMADol-acetaminophen (ULTRACET) 37.5-325 MG tablet, Take 1 tablet by mouth every 6 (six) hours., Disp: 60 tablet, Rfl: 3   verapamil (CALAN-SR) 240 MG CR tablet, Take 1 tablet (240 mg total) by mouth daily., Disp: 90 tablet, Rfl: 3   Objective:     Vitals:   10/05/21 1247  BP: 120/74  Pulse: (!) 101  SpO2: 97%  Height: 4\' 10"  (1.473 m)  Body mass index is 24.41 kg/m.    Physical Exam:    Gen: Appears well, nad, nontoxic and pleasant Psych: Alert and oriented, appropriate mood and affect Neuro: sensation intact, strength is 5/5 in upper and lower extremities, muscle tone wnl Skin: no susupicious lesions or rashes  Back - Normal skin, Spine with normal alignment and no deformity.   No tenderness to vertebral process palpation.   Paraspinous muscles are not tender and without spasm Straight leg raise negative Logroll negative bilaterally  Electronically signed by:  [name] D.[name] 1:21 PM 10/05/21

## 2021-10-05 NOTE — Patient Instructions (Signed)
Continue tylenol 1000mg  3 times a day Stop taking tramadol as it can alter your mental status and cause you to fall.  See [place] again as needed

## 2021-10-08 NOTE — Progress Notes (Signed)
  Subjective:  Patient ID: [name], female    DOB: 1933/06/22,  MRN: [id number]  [name] is a very pleasant 87 y.o. female who presents to clinic today for at risk foot care. Patient has h/o CKD stage 3 and painful thick toenails that are difficult to trim. Pain interferes with ambulation. Aggravating factors include wearing enclosed shoe gear. Pain is relieved with periodic professional debridement.  Patient has DJD of the spine.   She states she was brought to the clinic by a gentleman she help raise. 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  PCP is Janith Lima, MD , and last visit was 07/13/2021.  No Known Allergies  Review of Systems: Negative except as noted in the HPI. Objective:   Constitutional [name] is a pleasant 87 y.o. African American female, frail, in NAD. AAO x 3.   Vascular Capillary fill time to digits <3 seconds b/l lower extremities. Palpable pedal pulses b/l LE. Pedal hair absent. Lower extremity skin temperature gradient within normal limits. No pain with calf compression b/l. +1 pitting edema b/l lower extremities. No cyanosis or clubbing noted.  Neurologic Normal speech. Oriented to person, place, and time. Protective sensation intact 5/5 intact bilaterally with 10g monofilament b/l.  Dermatologic Skin warm and supple b/l lower extremities. No open wounds b/l LE. No interdigital macerations b/l lower extremities. Toenails 1-5 b/l elongated, discolored, dystrophic, thickened, crumbly with subungual debris and tenderness to dorsal palpation.  Orthopedic: Normal muscle strength 5/5 to all lower extremity muscle groups bilaterally. Hallux valgus with bunion deformity noted b/l lower extremities. Hammertoe(s) noted to the 2-5 bilaterally. Utilizes cane for ambulation assistance.   Radiographs: None Assessment:   1. Pain due to onychomycosis of toenail    Plan:  Patient was evaluated and treated and all questions answered. Consent given for treatment as  described below: -No new findings. No new orders. -Patient to continue soft, supportive shoe gear daily. -Toenails 1-5 b/l were debrided in length and girth with sterile nail nippers and dremel without iatrogenic bleeding.  -Patient to report any pedal injuries to medical professional immediately. -Patient/POA to call should there be question/concern in the interim.  Return in about 3 months (around 01/04/2022).  Marzetta Board, DPM

## 2021-11-04 ENCOUNTER — Other Ambulatory Visit: Payer: Self-pay | Admitting: Internal Medicine

## 2021-11-04 DIAGNOSIS — I10 Essential (primary) hypertension: Secondary | ICD-10-CM

## 2021-11-07 ENCOUNTER — Ambulatory Visit (INDEPENDENT_AMBULATORY_CARE_PROVIDER_SITE_OTHER): Payer: Medicare Other | Admitting: Internal Medicine

## 2021-11-07 ENCOUNTER — Other Ambulatory Visit: Payer: Self-pay

## 2021-11-07 ENCOUNTER — Encounter: Payer: Self-pay | Admitting: Internal Medicine

## 2021-11-07 VITALS — BP 122/66 | HR 64 | Temp 97.9°F | Resp 16 | Ht <= 58 in | Wt 117.0 lb

## 2021-11-07 DIAGNOSIS — M5136 Other intervertebral disc degeneration, lumbar region: Secondary | ICD-10-CM

## 2021-11-07 DIAGNOSIS — M479 Spondylosis, unspecified: Secondary | ICD-10-CM

## 2021-11-07 DIAGNOSIS — D508 Other iron deficiency anemias: Secondary | ICD-10-CM

## 2021-11-07 DIAGNOSIS — I10 Essential (primary) hypertension: Secondary | ICD-10-CM

## 2021-11-07 LAB — CBC WITH DIFFERENTIAL/PLATELET
Basophils Absolute: 0.1 10*3/uL (ref 0.0–0.1)
Basophils Relative: 0.9 % (ref 0.0–3.0)
Eosinophils Absolute: 0.2 10*3/uL (ref 0.0–0.7)
Eosinophils Relative: 4.6 % (ref 0.0–5.0)
HCT: 33 % — ABNORMAL LOW (ref 36.0–46.0)
Hemoglobin: 10.9 g/dL — ABNORMAL LOW (ref 12.0–15.0)
Lymphocytes Relative: 13.1 % (ref 12.0–46.0)
Lymphs Abs: 0.7 10*3/uL (ref 0.7–4.0)
MCHC: 33.1 g/dL (ref 30.0–36.0)
MCV: 87.4 fl (ref 78.0–100.0)
Monocytes Absolute: 0.5 10*3/uL (ref 0.1–1.0)
Monocytes Relative: 9.3 % (ref 3.0–12.0)
Neutro Abs: 3.9 10*3/uL (ref 1.4–7.7)
Neutrophils Relative %: 72.1 % (ref 43.0–77.0)
Platelets: 325 10*3/uL (ref 150.0–400.0)
RBC: 3.77 Mil/uL — ABNORMAL LOW (ref 3.87–5.11)
RDW: 14.4 % (ref 11.5–15.5)
WBC: 5.4 10*3/uL (ref 4.0–10.5)

## 2021-11-07 MED ORDER — FERROUS SULFATE 325 (65 FE) MG PO TABS: 325.0000 mg | ORAL_TABLET | Freq: Every day | ORAL | 1 refills | Status: DC

## 2021-11-07 MED ORDER — TRAMADOL-ACETAMINOPHEN 37.5-325 MG PO TABS: 1.0000 | ORAL_TABLET | Freq: Four times a day (QID) | ORAL | 5 refills | Status: DC | PRN

## 2021-11-07 MED ORDER — GABAPENTIN 800 MG PO TABS: 800.0000 mg | ORAL_TABLET | Freq: Every day | ORAL | 1 refills | Status: DC

## 2021-11-07 NOTE — Patient Instructions (Signed)
Iron Deficiency Anemia, Adult Iron deficiency anemia is a condition in which the concentration of red blood cells or hemoglobin in the blood is below normal because of too little iron. Hemoglobin is a substance in red blood cells that carries oxygen to the body's tissues. When the concentration of red blood cells or hemoglobin is too low, not enough oxygen reaches these tissues. Iron deficiency anemia is usually long-lasting, and it develops over time. It may or may not cause symptoms. It is a common type of anemia. What are the causes? This condition may be caused by: Not enough iron in the diet. Abnormal absorption in the gut. Increased need for iron because of pregnancy or heavy menstrual periods, for females. Cancers of the gastrointestinal system, such as colon cancer. Blood loss caused by bleeding in the intestine. This may be from a gastrointestinal condition like Crohn's disease. Frequent blood draws, such as from blood donation. What increases the risk? The following factors may make you more likely to develop this condition: Being pregnant. Being a teenage girl going through a growth spurt. What are the signs or symptoms? Symptoms of this condition may include: Pale skin, lips, and nail beds. Weakness, dizziness, and getting tired easily. Headache. Shortness of breath when moving or exercising. Cold hands and feet. Fast or irregular heartbeat. Irritability or rapid breathing. These are more common in severe anemia. Mild anemia may not cause any symptoms. How is this diagnosed? This condition is diagnosed based on: Your medical history. A physical exam. Blood tests. You may have additional tests to find the underlying cause of your anemia, such as: Testing for blood in the stool (fecal occult blood test). A procedure to see inside your colon and rectum (colonoscopy). A procedure to see inside your esophagus and stomach (endoscopy). A test in which cells are removed from  bone marrow (bone marrow aspiration) or fluid is removed from the bone marrow to be examined. This is rarely needed. How is this treated? This condition is treated by correcting the cause of your iron deficiency. Treatment may involve: Adding iron-rich foods to your diet. Taking iron supplements. If you are pregnant or breastfeeding, you may need to take extra iron because your normal diet usually does not provide the amount of iron that you need. Increasing vitamin C intake. Vitamin C helps your body absorb iron. Your health care provider may recommend that you take iron supplements along with a glass of orange juice or a vitamin C supplement. Medicines to make heavy menstrual flow lighter. Surgery. You may need repeat blood tests to determine whether treatment is working. If the treatment does not seem to be working, you may need more tests. Follow these instructions at home: Medicines Take over-the-counter and prescription medicines only as told by your health care provider. This includes iron supplements and vitamins. For the best iron absorption, you should take iron supplements when your stomach is empty. If you cannot tolerate them on an empty stomach, you may need to take them with food. Do not drink milk or take antacids at the same time as your iron supplements. Milk and antacids may interfere with iron absorption. Iron supplements may turn stool (feces) a darker color and it may appear black. If you cannot tolerate taking iron supplements by mouth, talk with your health care provider about taking them through an IV or through an injection into a muscle. Eating and drinking  Talk with your health care provider before changing your diet. He or she may recommend   that you eat foods that contain a lot of iron, such as: Liver. Low-fat (lean) beef. Breads and cereals that have iron added to them (are fortified). Eggs. Dried fruit. Dark green, leafy vegetables. To help your body use the  iron from iron-rich foods, eat those foods at the same time as fresh fruits and vegetables that are high in vitamin C. Foods that are high in vitamin C include: Oranges. Peppers. Tomatoes. Mangoes. Drink enough fluid to keep your urine pale yellow. Managing constipation If you are taking an iron supplement, it may cause constipation. To prevent or treat constipation, you may need to: Take over-the-counter or prescription medicines. Eat foods that are high in fiber, such as beans, whole grains, and fresh fruits and vegetables. Limit foods that are high in fat and processed sugars, such as fried or sweet foods. General instructions Return to your normal activities as told by your health care provider. Ask your health care provider what activities are safe for you. Practice good hygiene. Anemia can make you more prone to illness and infection. Keep all follow-up visits as told by your health care provider. This is important. Contact a health care provider if you: Feel nauseous or you vomit. Feel weak. Have unexplained sweating. Develop symptoms of constipation, such as: Having fewer than three bowel movements a week. Straining to have a bowel movement. Having stools that are hard, dry, or larger than normal. Feeling full or bloated. Pain in the lower abdomen. Not feeling relief after having a bowel movement. Get help right away if you: Faint. If this happens, do not drive yourself to the hospital. Have chest pain. Have shortness of breath that: Is severe. Gets worse with physical activity. Have an irregular or rapid heartbeat. Become light-headed when getting up from a sitting or lying down position. These symptoms may represent a serious problem that is an emergency. Do not wait to see if the symptoms will go away. Get medical help right away. Call your local emergency services (911 in the [place] Do not drive yourself to the hospital. Summary Iron deficiency anemia is a condition in  which the concentration of red blood cells or hemoglobin in the blood is below normal because of too little iron. This condition is treated by correcting the cause of your iron deficiency. Take over-the-counter and prescription medicines only as told by your health care provider. This includes iron supplements and vitamins. To help your body use the iron from iron-rich foods, eat those foods at the same time as fresh fruits and vegetables that are high in vitamin C. Get help right away if you have shortness of breath that gets worse with physical activity. This information is not intended to replace advice given to you by your health care provider. Make sure you discuss any questions you have with your health care provider. Document Revised: 08/19/2019 Document Reviewed: 08/19/2019 Elsevier Patient Education  2022 [employer] Inc.  

## 2021-11-07 NOTE — Progress Notes (Signed)
Subjective:  Patient ID: [name], female    DOB: 10/30/1933  Age: 87 y.o. MRN: [id number]  CC: Anemia  This visit occurred during the SARS-CoV-2 public health emergency.  Safety protocols were in place, including screening questions prior to the visit, additional usage of staff PPE, and extensive cleaning of exam room while observing appropriate contact time as indicated for disinfecting solutions.    HPI [name] presents for f/up -  She complains of musculoskeletal pain and requests a refill of tramadol/acetaminophen.  She is taking the iron supplement and denies shortness of breath, fatigue, abdominal pain, constipation, or any sources of blood loss.  Outpatient Medications Prior to Visit  Medication Sig Dispense Refill   hydrochlorothiazide (MICROZIDE) 12.5 MG capsule TAKE 1 CAPSULE 3 TIMES A WEEK 90 capsule 3   hydrocortisone 2.5 % ointment APPLY EXTERNALLY TO THE AFFECTED AREA TWICE DAILY 28.35 g 0   latanoprost (XALATAN) 0.005 % ophthalmic solution INT 1 GTT IN OD QD     lidocaine (LIDODERM) 5 % Place 1 patch onto the skin daily as needed. Apply patch to area most significant pain once per day.  Remove and discard patch within 12 hours of application. 30 patch 1   metoprolol succinate (TOPROL-XL) 50 MG 24 hr tablet TAKE 1 TABLET BY MOUTH EVERY DAY WITH OR IMMEDIATELY FOLLOWING A MEAL 90 tablet 1   nystatin cream (MYCOSTATIN) Apply to external affected area twice a day for 10 days as needed. 30 g 1   ondansetron (ZOFRAN ODT) 4 MG disintegrating tablet Take 1 tablet (4 mg total) by mouth every 8 (eight) hours as needed for nausea or vomiting. 30 tablet 2   pantoprazole (PROTONIX) 40 MG tablet TAKE 1 TABLET(40 MG) BY MOUTH TWICE DAILY 180 tablet 1   polyethylene glycol powder (GLYCOLAX/MIRALAX) 17 GM/SCOOP powder Take 17 g by mouth daily. 3350 g 2   promethazine (PHENERGAN) 12.5 MG tablet TAKE 1 TABLET BY MOUTH EVERY 6 HOURS AS NEEDED FOR NAUSEA OR VOMITING 30 tablet 6    verapamil (CALAN-SR) 240 MG CR tablet Take 1 tablet (240 mg total) by mouth daily. 90 tablet 3   ferrous sulfate (FEROSUL) 325 (65 FE) MG tablet Take 1 tablet (325 mg total) by mouth daily with breakfast. 90 tablet 1   gabapentin (NEURONTIN) 800 MG tablet Take 1 tablet (800 mg total) by mouth at bedtime. 90 tablet 1   traMADol-acetaminophen (ULTRACET) 37.5-325 MG tablet Take 1 tablet by mouth every 6 (six) hours. 60 tablet 3   No facility-administered medications prior to visit.    ROS Review of Systems  Constitutional:  Negative for diaphoresis, fatigue and unexpected weight change.  HENT: Negative.    Eyes: Negative.   Respiratory:  Negative for cough, chest tightness, shortness of breath and wheezing.   Cardiovascular:  Negative for chest pain, palpitations and leg swelling.  Gastrointestinal:  Negative for abdominal pain, blood in stool, constipation, diarrhea, nausea and vomiting.  Endocrine: Negative.   Genitourinary: Negative.  Negative for difficulty urinating.  Musculoskeletal:  Positive for arthralgias and back pain. Negative for myalgias.  Neurological:  Negative for dizziness, weakness and light-headedness.  Hematological:  Negative for adenopathy. Does not bruise/bleed easily.  Psychiatric/Behavioral: Negative.     Objective:  BP 122/66 (BP Location: Left Arm, Patient Position: Sitting, Cuff Size: Normal)   Pulse 64   Temp 97.9 F (36.6 C) (Oral)   Resp 16   Ht 4\' 10"  (1.473 m)   Wt 117 lb (53.1 kg)  SpO2 97%   BMI 24.45 kg/m   BP Readings from Last 3 Encounters:  11/07/21 122/66  10/05/21 120/74  09/28/21 100/60    Wt Readings from Last 3 Encounters:  11/07/21 117 lb (53.1 kg)  07/13/21 116 lb 12.8 oz (53 kg)  05/16/21 121 lb (54.9 kg)    Physical Exam Vitals reviewed.  Constitutional:      Appearance: She is ill-appearing (in a wheelchair).  HENT:     Nose: Nose normal.     Mouth/Throat:     Mouth: Mucous membranes are moist.  Eyes:      Conjunctiva/sclera: Conjunctivae normal.  Cardiovascular:     Rate and Rhythm: Normal rate and regular rhythm.     Heart sounds: No murmur heard. Pulmonary:     Effort: Pulmonary effort is normal.     Breath sounds: No stridor. No wheezing, rhonchi or rales.  Abdominal:     General: Abdomen is flat.     Palpations: There is no mass.     Tenderness: There is no abdominal tenderness. There is no guarding.     Hernia: No hernia is present.  Musculoskeletal:        General: Normal range of motion.     Cervical back: Neck supple.     Right lower leg: No edema.     Left lower leg: No edema.  Lymphadenopathy:     Cervical: No cervical adenopathy.  Skin:    General: Skin is dry.  Neurological:     General: No focal deficit present.     Mental Status: She is alert.  Psychiatric:        Behavior: Behavior normal.    Lab Results  Component Value Date   WBC 5.4 11/07/2021   HGB 10.9 (L) 11/07/2021   HCT 33.0 (L) 11/07/2021   PLT 325.0 11/07/2021   GLUCOSE 91 05/05/2021   CHOL 175 11/03/2019   TRIG 79 11/03/2019   HDL 82 11/03/2019   LDLCALC 78 11/03/2019   ALT 10 05/05/2021   AST 15 05/05/2021   NA 144 05/05/2021   K 4.1 05/05/2021   CL 106 05/05/2021   CREATININE 0.96 05/05/2021   BUN 17 05/05/2021   CO2 31 05/05/2021   TSH 1.86 05/05/2021   HGBA1C 5.4 05/30/2017    CT Thoracic Spine Wo Contrast  Result Date: 12/11/2020 CLINICAL DATA:  Mid back pain EXAM: CT THORACIC AND LUMBAR SPINE WITHOUT CONTRAST TECHNIQUE: Multidetector CT imaging of the thoracic and lumbar spine was performed without contrast. Multiplanar CT image reconstructions were also generated. COMPARISON:  Cervical CT 03/18/2018 FINDINGS: CT THORACIC SPINE FINDINGS Alignment: Mild cervicothoracic curvature convex to the right. Mild thoracic curvature convex to the left. Vertebrae: No evidence of fracture or focal bone lesion. Paraspinal and other soft tissues: Hiatal hernia. Disc levels: Degenerative disc  disease throughout the thoracic region with disc space narrowing and vacuum phenomenon. Annular bulging to a minor degree from T6-7 through T11-12. No evidence of soft disc herniation or compressive stenosis of the canal or foramina. Ordinary mild facet osteoarthritis and costovertebral osteoarthritis. No advanced finding. CT LUMBAR SPINE FINDINGS Segmentation: 5 lumbar type vertebral bodies. Alignment: Thoracolumbar curvature convex to the left and lower lumbar curvature convex to the right. Vertebrae: No fracture or primary bone lesion. Chronic discogenic endplate changes with sclerosis and cyst formation. Paraspinal and other soft tissues: Aortic atherosclerosis without evidence of aneurysm. Disc levels: L1-2: Disc degeneration with vacuum phenomenon. Mild bulging of the disc. No compressive stenosis. L2-3:  Disc degeneration of vacuum phenomenon. Endplate osteophytes and mild bulging of the disc. Facet osteoarthritis. Right foraminal narrowing. L3-4: Disc degeneration with vacuum phenomenon. Endplate osteophytes and bulging of the disc. Facet osteoarthritis. No compressive stenosis. L4-5: Disc degeneration with loss of height and vacuum phenomenon. Small endplate osteophytes. Facet osteoarthritis. No compressive canal or foraminal narrowing. L5-S1: Disc degeneration with loss of disc height and vacuum phenomenon. Endplate osteophytes. Facet osteoarthritis. No apparent compressive stenosis. Bilateral sacroiliac osteoarthritis is present. IMPRESSION: Spinal curvature. Chronic degenerative disc disease throughout the thoracic and lumbar region, most pronounced in the lumbar region where there is advanced loss of disc height and vacuum phenomenon throughout. The findings in general could certainly relate to chronic back pain. There is no evidence of a compression fracture, either old or acute. The patient does not appear to have compressive stenosis of the thoracic or lumbar spinal canal. There is facet  osteoarthritis at the region which also could contribute to back pain. There is bilateral sacroiliac osteoarthritis which could be painful. Electronically Signed   By: Nelson Chimes M.D.   On: 12/11/2020 18:46   CT Lumbar Spine Wo Contrast  Result Date: 12/11/2020 CLINICAL DATA:  Mid back pain EXAM: CT THORACIC AND LUMBAR SPINE WITHOUT CONTRAST TECHNIQUE: Multidetector CT imaging of the thoracic and lumbar spine was performed without contrast. Multiplanar CT image reconstructions were also generated. COMPARISON:  Cervical CT 03/18/2018 FINDINGS: CT THORACIC SPINE FINDINGS Alignment: Mild cervicothoracic curvature convex to the right. Mild thoracic curvature convex to the left. Vertebrae: No evidence of fracture or focal bone lesion. Paraspinal and other soft tissues: Hiatal hernia. Disc levels: Degenerative disc disease throughout the thoracic region with disc space narrowing and vacuum phenomenon. Annular bulging to a minor degree from T6-7 through T11-12. No evidence of soft disc herniation or compressive stenosis of the canal or foramina. Ordinary mild facet osteoarthritis and costovertebral osteoarthritis. No advanced finding. CT LUMBAR SPINE FINDINGS Segmentation: 5 lumbar type vertebral bodies. Alignment: Thoracolumbar curvature convex to the left and lower lumbar curvature convex to the right. Vertebrae: No fracture or primary bone lesion. Chronic discogenic endplate changes with sclerosis and cyst formation. Paraspinal and other soft tissues: Aortic atherosclerosis without evidence of aneurysm. Disc levels: L1-2: Disc degeneration with vacuum phenomenon. Mild bulging of the disc. No compressive stenosis. L2-3: Disc degeneration of vacuum phenomenon. Endplate osteophytes and mild bulging of the disc. Facet osteoarthritis. Right foraminal narrowing. L3-4: Disc degeneration with vacuum phenomenon. Endplate osteophytes and bulging of the disc. Facet osteoarthritis. No compressive stenosis. L4-5: Disc  degeneration with loss of height and vacuum phenomenon. Small endplate osteophytes. Facet osteoarthritis. No compressive canal or foraminal narrowing. L5-S1: Disc degeneration with loss of disc height and vacuum phenomenon. Endplate osteophytes. Facet osteoarthritis. No apparent compressive stenosis. Bilateral sacroiliac osteoarthritis is present. IMPRESSION: Spinal curvature. Chronic degenerative disc disease throughout the thoracic and lumbar region, most pronounced in the lumbar region where there is advanced loss of disc height and vacuum phenomenon throughout. The findings in general could certainly relate to chronic back pain. There is no evidence of a compression fracture, either old or acute. The patient does not appear to have compressive stenosis of the thoracic or lumbar spinal canal. There is facet osteoarthritis at the region which also could contribute to back pain. There is bilateral sacroiliac osteoarthritis which could be painful. Electronically Signed   By: Nelson Chimes M.D.   On: 12/11/2020 18:46    Assessment & Plan:   Bertrice was seen today for anemia.  Diagnoses  and all orders for this visit:  Essential hypertension- Her blood pressure is well controlled.  Wwill continue the current doses of metoprolol, the CCB, and hydrochlorothiazide.  Degenerative disc disease, lumbar -     traMADol-acetaminophen (ULTRACET) 37.5-325 MG tablet; Take 1 tablet by mouth every 6 (six) hours as needed.  Spondylosis -     traMADol-acetaminophen (ULTRACET) 37.5-325 MG tablet; Take 1 tablet by mouth every 6 (six) hours as needed. -     gabapentin (NEURONTIN) 800 MG tablet; Take 1 tablet (800 mg total) by mouth at bedtime.  Iron deficiency anemia due to dietary causes -     ferrous sulfate (FEROSUL) 325 (65 FE) MG tablet; Take 1 tablet (325 mg total) by mouth daily with breakfast. -     CBC with Differential/Platelet; Future -     CBC with Differential/Platelet  I have changed Acasia Weirauch's  traMADol-acetaminophen. I am also having her maintain her hydrocortisone, latanoprost, nystatin cream, lidocaine, polyethylene glycol powder, pantoprazole, promethazine, ondansetron, verapamil, hydrochlorothiazide, metoprolol succinate, gabapentin, and ferrous sulfate.  Meds ordered this encounter  Medications   traMADol-acetaminophen (ULTRACET) 37.5-325 MG tablet    Sig: Take 1 tablet by mouth every 6 (six) hours as needed.    Dispense:  60 tablet    Refill:  5   gabapentin (NEURONTIN) 800 MG tablet    Sig: Take 1 tablet (800 mg total) by mouth at bedtime.    Dispense:  90 tablet    Refill:  1   ferrous sulfate (FEROSUL) 325 (65 FE) MG tablet    Sig: Take 1 tablet (325 mg total) by mouth daily with breakfast.    Dispense:  90 tablet    Refill:  1     Follow-up: Return in about 6 months (around 05/07/2022).  Scarlette Calico, MD

## 2021-11-09 ENCOUNTER — Telehealth: Payer: Self-pay | Admitting: Internal Medicine

## 2021-11-09 ENCOUNTER — Other Ambulatory Visit: Payer: Self-pay | Admitting: Internal Medicine

## 2021-11-09 DIAGNOSIS — K219 Gastro-esophageal reflux disease without esophagitis: Secondary | ICD-10-CM

## 2021-11-09 MED ORDER — PANTOPRAZOLE SODIUM 40 MG PO TBEC: 40.0000 mg | DELAYED_RELEASE_TABLET | Freq: Every day | ORAL | 1 refills | Status: DC

## 2021-11-09 NOTE — Telephone Encounter (Signed)
1.Medication Requested: pantoprazole (PROTONIX) 40 MG tablet  2. Pharmacy (Name, Street, [place]): [facility] [phone] - [address], [address]  Phone:  [phone] Fax:  [phone]   3. On Med List: yes  4. Last Visit with PCP: 11.14.22   5. Next visit date with PCP: n/a   Agent: Please be advised that RX refills may take up to 3 business days. We ask that you follow-up with your pharmacy.

## 2021-11-09 NOTE — Telephone Encounter (Signed)
Patient requesting a call back to discuss lab results

## 2021-11-09 NOTE — Telephone Encounter (Signed)
Called pt, LVM to discuss.  

## 2022-01-02 ENCOUNTER — Ambulatory Visit: Payer: Medicare Other

## 2022-01-02 ENCOUNTER — Other Ambulatory Visit: Payer: Self-pay

## 2022-01-11 ENCOUNTER — Ambulatory Visit (INDEPENDENT_AMBULATORY_CARE_PROVIDER_SITE_OTHER): Payer: Medicare Other | Admitting: Podiatry

## 2022-01-11 ENCOUNTER — Other Ambulatory Visit: Payer: Self-pay

## 2022-01-11 ENCOUNTER — Encounter: Payer: Self-pay | Admitting: Podiatry

## 2022-01-11 DIAGNOSIS — M79676 Pain in unspecified toe(s): Secondary | ICD-10-CM

## 2022-01-11 DIAGNOSIS — B351 Tinea unguium: Secondary | ICD-10-CM

## 2022-01-16 ENCOUNTER — Other Ambulatory Visit: Payer: Self-pay | Admitting: Internal Medicine

## 2022-01-16 DIAGNOSIS — M479 Spondylosis, unspecified: Secondary | ICD-10-CM

## 2022-01-17 NOTE — Progress Notes (Signed)
Subjective: [name] is a 88 y.o. female patient seen today for follow up of  painful elongated mycotic toenails 1-5 bilaterally which are tender when wearing enclosed shoe gear. Pain is relieved with periodic professional debridement..   New problems reported today: None.  PCP is Janith Lima, MD. Last visit was: 11/07/2021.  No Known Allergies  Objective: Physical Exam  General: Patient is a pleasant 88 y.o. African American female frail, in NAD. AAO x 3.   Neurovascular Examination: CFT <3 seconds b/l LE. Faintly palpable pedal pulses b/l LE. Pedal hair absent b/l LE. Skin temperature gradient WNL b/l. No pain with calf compression b/l. No edema b/l LE. No cyanosis or clubbing noted b/l LE.  Protective sensation intact 5/5 intact bilaterally with 10g monofilament b/l.  Dermatological:  Pedal skin thin and atrophic b/l LE. No open wounds b/l LE. No interdigital macerations noted b/l LE. Toenails 1-5 b/l elongated, discolored, dystrophic, thickened, crumbly with subungual debris and tenderness to dorsal palpation. Hyperkeratotic lesion(s) plantar aspect of heel right foot.  No erythema, no edema, no drainage, no fluctuance.  Musculoskeletal:  HAV with bunion deformity noted b/l LE. Hammertoe deformity noted 2-5 b/l.  Assessment: 1. Pain due to onychomycosis of toenail    Plan: Patient was evaluated and treated and all questions answered. Consent given for treatment as described below: -Mycotic toenails 1-5 bilaterally were debrided in length and girth with sterile nail nippers and dremel without incident. -As a courtesy, callus(es) plantar heel pad of right foot pared utilizing sterile scalpel blade without complication or incident. Total number debrided =1. -Patient/POA to call should there be question/concern in the interim.  Return in about 3 months (around 04/11/2022).  Marzetta Board, DPM

## 2022-01-20 ENCOUNTER — Other Ambulatory Visit: Payer: Self-pay

## 2022-01-20 ENCOUNTER — Encounter: Payer: Self-pay | Admitting: Nurse Practitioner

## 2022-01-20 ENCOUNTER — Ambulatory Visit (INDEPENDENT_AMBULATORY_CARE_PROVIDER_SITE_OTHER): Payer: Medicare Other | Admitting: Nurse Practitioner

## 2022-01-20 VITALS — HR 88 | Temp 97.7°F

## 2022-01-20 DIAGNOSIS — R21 Rash and other nonspecific skin eruption: Secondary | ICD-10-CM | POA: Diagnosis not present

## 2022-01-20 NOTE — Progress Notes (Signed)
Subjective:  Patient ID: [name], female    DOB: 12-Jun-1933  Age: 88 y.o. MRN: [id number]  CC:  Chief Complaint  Patient presents with   Rash    On both legs no burning       HPI  This patient arrives today for the above.  She noticed the rash "a couple of days ago". The rash has been stable. It is present on bilateral lower legs. It does not hurt, itch, there are no open/draining lesions. She did not hit her legs as far as she knows.  She does have bilateral lower leg edema. She has not tried any at home treatments.  No recent history of illness, chest pain, difficulty breathing, or pain elsewhere in her body.  Past Medical History:  Diagnosis Date   Abnormal finding on Pap smear, ASCUS    Acute renal insufficiency    Amputation finger    index   Barrett's esophagus    CAD (coronary artery disease)    Dementia (HCC)    Diverticulosis    Elevated BP    Foot deformity    GERD (gastroesophageal reflux disease)    Glaucoma    H/O: GI bleed    HSV-2 (herpes simplex virus 2) infection    Hyperglycemia    Hypertension    Migraine    Osteoporosis    worsening   Spondylosis       Family History  Problem Relation Age of Onset   Breast cancer Mother    Heart attack Mother    Heart disease Father    Alcohol abuse Son    Colon cancer Neg Hx    Stomach cancer Neg Hx    Rectal cancer Neg Hx     Social History   Social History Narrative   Marital status: divorced.      Children: 1 son; no grandchildren      Lives: with son      Employment: retired      Tobacco: none      Alcohol: none      Drugs: none       Exercise: none       ADLs:  Independent with ADLs.  Quit driving age 69.  Medical CNA cleans house; makes breakfast; patient pays bills.      Advanced Directives:   YES.  HCPOA:  Son/Isaac Bowman.  FULL CODE; no prolonged measures.   Social History   Tobacco Use   Smoking status: Never   Smokeless tobacco: Never  Substance Use Topics   Alcohol  use: No    Alcohol/week: 0.0 standard drinks     Current Meds  Medication Sig   ferrous sulfate (FEROSUL) 325 (65 FE) MG tablet Take 1 tablet (325 mg total) by mouth daily with breakfast.   gabapentin (NEURONTIN) 800 MG tablet TAKE 1 TABLET(800 MG) BY MOUTH AT BEDTIME   hydrochlorothiazide (MICROZIDE) 12.5 MG capsule TAKE 1 CAPSULE 3 TIMES A WEEK   hydrocortisone 2.5 % ointment APPLY EXTERNALLY TO THE AFFECTED AREA TWICE DAILY   latanoprost (XALATAN) 0.005 % ophthalmic solution INT 1 GTT IN OD QD   lidocaine (LIDODERM) 5 % Place 1 patch onto the skin daily as needed. Apply patch to area most significant pain once per day.  Remove and discard patch within 12 hours of application.   metoprolol succinate (TOPROL-XL) 50 MG 24 hr tablet TAKE 1 TABLET BY MOUTH EVERY DAY WITH OR IMMEDIATELY FOLLOWING A MEAL   nystatin cream (MYCOSTATIN)  Apply to external affected area twice a day for 10 days as needed.   ondansetron (ZOFRAN ODT) 4 MG disintegrating tablet Take 1 tablet (4 mg total) by mouth every 8 (eight) hours as needed for nausea or vomiting.   pantoprazole (PROTONIX) 40 MG tablet Take 1 tablet (40 mg total) by mouth daily. TAKE 1 TABLET(40 MG) BY MOUTH TWICE DAILY   polyethylene glycol powder (GLYCOLAX/MIRALAX) 17 GM/SCOOP powder Take 17 g by mouth daily.   promethazine (PHENERGAN) 12.5 MG tablet TAKE 1 TABLET BY MOUTH EVERY 6 HOURS AS NEEDED FOR NAUSEA OR VOMITING   traMADol-acetaminophen (ULTRACET) 37.5-325 MG tablet Take 1 tablet by mouth every 6 (six) hours as needed.   verapamil (CALAN-SR) 240 MG CR tablet Take 1 tablet (240 mg total) by mouth daily.    ROS:  Review of Systems  Constitutional:  Positive for malaise/fatigue. Negative for fever and weight loss.  Eyes:  Negative for blurred vision.  Respiratory:  Negative for cough and shortness of breath.   Cardiovascular:  Negative for chest pain.  Gastrointestinal:  Positive for nausea. Negative for abdominal pain.  Genitourinary:   Negative for hematuria.  Neurological:  Negative for headaches.    Objective:   Today's Vitals: Pulse 88    Temp 97.7 F (36.5 C) (Oral)    SpO2 99%  Vitals with BMI 01/20/2022 11/07/2021 10/05/2021  Height - 4\' 10"  4\' 10"   Weight - 117 lbs -  BMI - 16.10 -  Systolic - 960 454  Diastolic - 66 74  Pulse 88 64 101     Physical Exam Vitals reviewed.  Constitutional:      General: She is not in acute distress.    Appearance: Normal appearance.  HENT:     Head: Normocephalic and atraumatic.  Neck:     Vascular: No carotid bruit.  Cardiovascular:     Rate and Rhythm: Normal rate and regular rhythm.     Pulses: Normal pulses.     Heart sounds: Normal heart sounds.  Pulmonary:     Effort: Pulmonary effort is normal.     Breath sounds: Normal breath sounds.  Musculoskeletal:     Right lower leg: 2+ Edema present.     Left lower leg: 2+ Edema present.  Skin:    General: Skin is warm and dry.          Comments: Macular rash, slightly red/brown. No vesicles, lesions are small and circular, they are grouped together. No open/draining areas. Nontender to touch, not hot to touch  Neurological:     General: No focal deficit present.     Mental Status: She is alert and oriented to person, place, and time.  Psychiatric:        Mood and Affect: Mood normal.        Behavior: Behavior normal.        Judgment: Judgment normal.         Assessment and Plan   1. Rash      Plan: Not sure of etiology. I am wondering if these could be a venous stasis rash.  Because she is not experiencing pain or itching for now I recommend she elevate legs is much as possible.  I will refer to dermatology for further assistance in assessing and managing her rash.  I will have her follow-up with [place] in about 1 month for close monitoring.   Tests ordered Orders Placed This Encounter  Procedures   Ambulatory referral to Dermatology  No orders of the defined types were placed in this  encounter.   Patient to follow-up in 1 month or sooner as needed.  Ailene Ards, NP

## 2022-01-25 ENCOUNTER — Telehealth: Payer: Self-pay | Admitting: Internal Medicine

## 2022-01-25 NOTE — Telephone Encounter (Signed)
Pt has been informed and expressed understanding.  

## 2022-01-25 NOTE — Telephone Encounter (Signed)
Patient calling in  Says she can no longer take the gabapentin (NEURONTIN) 800 MG tablet bc it is too strong  Says she is having trouble focusing.. jitters.. & also having trouble walking  Please call [phone]

## 2022-01-26 NOTE — Telephone Encounter (Signed)
Patient calling back in  Says instead of discontinuing the medication all together can she at least take 1/2? Says she needs something to control her nerves.Marland Kitchen if provider doesn't want her to take med can he prescribe a different kind  Please call patient [phone]

## 2022-01-27 ENCOUNTER — Other Ambulatory Visit: Payer: Self-pay | Admitting: Internal Medicine

## 2022-01-27 DIAGNOSIS — M7918 Myalgia, other site: Secondary | ICD-10-CM

## 2022-01-27 MED ORDER — GABAPENTIN 400 MG PO CAPS: 400.0000 mg | ORAL_CAPSULE | Freq: Every day | ORAL | 1 refills | Status: AC

## 2022-01-27 NOTE — Telephone Encounter (Signed)
Pt has been informed that lower dose has been sent to the pharmacy.

## 2022-02-01 ENCOUNTER — Telehealth: Payer: Self-pay

## 2022-02-01 NOTE — Telephone Encounter (Signed)
Pt states that the gabapentin (NEURONTIN) 400 MG capsule makes her sick to her stomach causing her to vomit. Pt states the medication is Red and she has never seen that medicine in a red color.  Pt states that she call 911 bc she thought she was dying.   Pt CB [phone]  Pt states that she is doing better without the medication

## 2022-02-01 NOTE — Telephone Encounter (Signed)
NOTE NOT NEEDED ?

## 2022-02-05 ENCOUNTER — Other Ambulatory Visit: Payer: Self-pay | Admitting: Internal Medicine

## 2022-02-05 DIAGNOSIS — K219 Gastro-esophageal reflux disease without esophagitis: Secondary | ICD-10-CM

## 2022-03-06 ENCOUNTER — Telehealth: Payer: Self-pay | Admitting: Internal Medicine

## 2022-03-06 ENCOUNTER — Other Ambulatory Visit: Payer: Self-pay | Admitting: Internal Medicine

## 2022-03-06 DIAGNOSIS — M479 Spondylosis, unspecified: Secondary | ICD-10-CM

## 2022-03-06 DIAGNOSIS — M5136 Other intervertebral disc degeneration, lumbar region: Secondary | ICD-10-CM

## 2022-03-06 MED ORDER — TRAMADOL-ACETAMINOPHEN 37.5-325 MG PO TABS: 1.0000 | ORAL_TABLET | Freq: Four times a day (QID) | ORAL | 5 refills | Status: DC | PRN

## 2022-03-06 NOTE — Telephone Encounter (Signed)
1.Medication Requested: traMADol-acetaminophen (ULTRACET) 37.5-325 MG tablet ? ?2. Pharmacy (Name, Street, [place]): [facility] [phone] - [place], [place] ? ?3. On Med List: Y ? ?4. Last Visit with PCP: 07-13-2021 ? ?5. Next visit date with PCP: 05-02-2022 ? ? ?Agent: Please be advised that RX refills may take up to 3 business days. We ask that you follow-up with your pharmacy.  ?

## 2022-04-10 ENCOUNTER — Other Ambulatory Visit: Payer: Self-pay | Admitting: Family Medicine

## 2022-04-12 ENCOUNTER — Ambulatory Visit: Payer: Medicare Other | Admitting: Podiatry

## 2022-04-13 ENCOUNTER — Ambulatory Visit: Payer: Medicare Other | Admitting: Internal Medicine

## 2022-04-14 ENCOUNTER — Telehealth: Payer: Self-pay | Admitting: Internal Medicine

## 2022-04-14 ENCOUNTER — Ambulatory Visit (INDEPENDENT_AMBULATORY_CARE_PROVIDER_SITE_OTHER): Payer: Medicare Other | Admitting: Family Medicine

## 2022-04-14 ENCOUNTER — Encounter: Payer: Self-pay | Admitting: Family Medicine

## 2022-04-14 VITALS — BP 116/62 | HR 74 | Temp 97.8°F | Ht <= 58 in

## 2022-04-14 DIAGNOSIS — L03115 Cellulitis of right lower limb: Secondary | ICD-10-CM | POA: Diagnosis not present

## 2022-04-14 DIAGNOSIS — R600 Localized edema: Secondary | ICD-10-CM | POA: Diagnosis not present

## 2022-04-14 LAB — COMPREHENSIVE METABOLIC PANEL
ALT: 10 U/L (ref 0–35)
AST: 16 U/L (ref 0–37)
Albumin: 3.7 g/dL (ref 3.5–5.2)
Alkaline Phosphatase: 57 U/L (ref 39–117)
BUN: 16 mg/dL (ref 6–23)
CO2: 31 mEq/L (ref 19–32)
Calcium: 9.2 mg/dL (ref 8.4–10.5)
Chloride: 104 mEq/L (ref 96–112)
Creatinine, Ser: 0.82 mg/dL (ref 0.40–1.20)
GFR: 64.7 mL/min (ref >60.00)
Glucose, Bld: 95 mg/dL (ref 70–99)
Potassium: 3.7 mEq/L (ref 3.5–5.1)
Sodium: 142 mEq/L (ref 135–145)
Total Bilirubin: 0.4 mg/dL (ref 0.2–1.2)
Total Protein: 6.8 g/dL (ref 6.0–8.3)

## 2022-04-14 LAB — CBC WITH DIFFERENTIAL/PLATELET
Basophils Absolute: 0.1 10*3/uL (ref 0.0–0.1)
Basophils Relative: 1.2 % (ref 0.0–3.0)
Eosinophils Absolute: 0.2 10*3/uL (ref 0.0–0.7)
Eosinophils Relative: 3 % (ref 0.0–5.0)
HCT: 37.1 % (ref 36.0–46.0)
Hemoglobin: 12.3 g/dL (ref 12.0–15.0)
Lymphocytes Relative: 14.2 % (ref 12.0–46.0)
Lymphs Abs: 0.7 10*3/uL (ref 0.7–4.0)
MCHC: 33.3 g/dL (ref 30.0–36.0)
MCV: 86.7 fl (ref 78.0–100.0)
Monocytes Absolute: 0.4 10*3/uL (ref 0.1–1.0)
Monocytes Relative: 7.6 % (ref 3.0–12.0)
Neutro Abs: 3.7 10*3/uL (ref 1.4–7.7)
Neutrophils Relative %: 74 % (ref 43.0–77.0)
Platelets: 296 10*3/uL (ref 150.0–400.0)
RBC: 4.28 Mil/uL (ref 3.87–5.11)
RDW: 14.5 % (ref 11.5–15.5)
WBC: 5 10*3/uL (ref 4.0–10.5)

## 2022-04-14 MED ORDER — CEPHALEXIN 500 MG PO CAPS: 500.0000 mg | ORAL_CAPSULE | Freq: Two times a day (BID) | ORAL | 0 refills | Status: DC

## 2022-04-14 NOTE — Telephone Encounter (Signed)
Connected to [facility] 4.21.2023. ? ?Caller states has legs/feet/ankle swelling in both legs for about 2 weeks. Denies pain and fever. ? ?Pt already has an appointment scheduled today.  ?

## 2022-04-14 NOTE — Progress Notes (Signed)
? ?Subjective:  ? ? Patient ID: [name], female    DOB: [birth year], 88 y.o.   MRN: [id number] ? ?HPI ?Chief Complaint  ?Patient presents with  ? Leg Swelling  ?  Redness and swelling in both legs for about 2 weeks   ? ? ?She is here with complaints of a 1-2 week history of bilateral lower extremity redness and edema.  ?Denies leg pain.  ?No fever, chills, chest pain, palpitations, shortness of breath, N/V/D.  ? ?She has dementia and is a poor historian.  States she has someone who helps her at home with cooking and cleaning. ? ? ? ? ?Past Medical History:  ?Diagnosis Date  ? Abnormal finding on Pap smear, ASCUS   ? Acute renal insufficiency   ? Amputation finger   ? index  ? Barrett's esophagus   ? CAD (coronary artery disease)   ? Dementia ([place])   ? Diverticulosis   ? Elevated BP   ? Foot deformity   ? GERD (gastroesophageal reflux disease)   ? Glaucoma   ? H/O: GI bleed   ? HSV-2 (herpes simplex virus 2) infection   ? Hyperglycemia   ? Hypertension   ? Migraine   ? Osteoporosis   ? worsening  ? Spondylosis   ? ?Past Surgical History:  ?Procedure Laterality Date  ? AMPUTATION FINGER / THUMB    ? secondary to osteomyelitis  ? CATARACT EXTRACTION, BILATERAL Bilateral 12/25/2016  ? COLONOSCOPY    ? ESOPHAGOGASTRODUODENOSCOPY N/A 10/22/2016  ? Procedure: ESOPHAGOGASTRODUODENOSCOPY (EGD);  Surgeon: Doran Stabler, MD;  Location: [facility] ENDOSCOPY;  Service: Endoscopy;  Laterality: N/A;  ? FOOT SURGERY    ? right  ? PARTIAL HYSTERECTOMY    ? UPPER GASTROINTESTINAL ENDOSCOPY    ? ?Current Outpatient Medications on File Prior to Visit  ?Medication Sig Dispense Refill  ? ferrous sulfate (FEROSUL) 325 (65 FE) MG tablet Take 1 tablet (325 mg total) by mouth daily with breakfast. 90 tablet 1  ? gabapentin (NEURONTIN) 400 MG capsule Take 1 capsule (400 mg total) by mouth at bedtime. 90 capsule 1  ? hydrochlorothiazide (MICROZIDE) 12.5 MG capsule TAKE 1 CAPSULE 3 TIMES A WEEK 90 capsule 3  ? hydrocortisone 2.5 % ointment APPLY  EXTERNALLY TO THE AFFECTED AREA TWICE DAILY 28.35 g 0  ? latanoprost (XALATAN) 0.005 % ophthalmic solution INT 1 GTT IN OD QD    ? lidocaine (LIDODERM) 5 % Place 1 patch onto the skin daily as needed. Apply patch to area most significant pain once per day.  Remove and discard patch within 12 hours of application. 30 patch 1  ? metoprolol succinate (TOPROL-XL) 50 MG 24 hr tablet TAKE 1 TABLET BY MOUTH EVERY DAY WITH OR IMMEDIATELY FOLLOWING A MEAL 90 tablet 1  ? nystatin cream (MYCOSTATIN) Apply to external affected area twice a day for 10 days as needed. 30 g 1  ? ondansetron (ZOFRAN ODT) 4 MG disintegrating tablet Take 1 tablet (4 mg total) by mouth every 8 (eight) hours as needed for nausea or vomiting. 30 tablet 2  ? pantoprazole (PROTONIX) 40 MG tablet TAKE 1 TABLET BY MOUTH ONCE TO TWICE DAILY 90 tablet 1  ? polyethylene glycol powder (GLYCOLAX/MIRALAX) 17 GM/SCOOP powder Take 17 g by mouth daily. 3350 g 2  ? promethazine (PHENERGAN) 12.5 MG tablet TAKE 1 TABLET BY MOUTH EVERY 6 HOURS AS NEEDED FOR NAUSEA OR VOMITING 30 tablet 6  ? traMADol-acetaminophen (ULTRACET) 37.5-325 MG tablet Take 1 tablet by mouth  every 6 (six) hours as needed. 60 tablet 5  ? verapamil (CALAN-SR) 240 MG CR tablet Take 1 tablet (240 mg total) by mouth daily. 90 tablet 3  ? ?No current facility-administered medications on file prior to visit.  ? ? ? ? ?Review of Systems ?Pertinent positives and negatives in the history of present illness. ? ?   ?Objective:  ? Physical Exam ?Constitutional:   ?   General: She is not in acute distress. ?   Appearance: She is not ill-appearing.  ?Cardiovascular:  ?   Rate and Rhythm: Normal rate.  ?   Pulses: Normal pulses.  ?Pulmonary:  ?   Effort: Pulmonary effort is normal.  ?   Breath sounds: Normal breath sounds.  ?Abdominal:  ?   Palpations: Abdomen is soft.  ?Musculoskeletal:  ?   Cervical back: Normal range of motion and neck supple.  ?   Right lower leg: Swelling present. No tenderness or bony  tenderness. 2+ Edema present.  ?   Left lower leg: Swelling present. No tenderness or bony tenderness. 1+ Pitting Edema present.  ?   Right ankle: Swelling present. Normal pulse.  ?   Left ankle: Swelling present. Normal pulse.  ?   Right foot: Normal range of motion and normal capillary refill. Swelling present. Normal pulse.  ?   Left foot: Normal range of motion and normal capillary refill. Swelling present. Normal pulse.  ?   Comments: Bilateral LE edema with right leg minimally more swollen <2 cm difference. No calf TTP. Negative Homan's.   ?Skin: ?   General: Skin is warm and dry.  ?   Capillary Refill: Capillary refill takes less than 2 seconds.  ?   Comments: Mild erythema noted to RLE from mid shin to right foot.   ?Neurological:  ?   Mental Status: She is alert.  ?   Sensory: Sensation is intact.  ? ?BP 116/62 (BP Location: Left Arm, Patient Position: Sitting, Cuff Size: Normal)   Pulse 74   Temp 97.8 ?F (36.6 ?C) (Temporal)   Ht '4\' 10"'$  (1.473 m)   SpO2 97%   BMI 24.45 kg/m?  ? ? ? ? ?   ?Assessment & Plan:  ?Cellulitis of right lower extremity - Plan: CBC with Differential/Platelet, Comprehensive metabolic panel, D-Dimer, Quantitative, cephALEXin (KEFLEX) 500 MG capsule, D-Dimer, Quantitative, Comprehensive metabolic panel, CBC with Differential/Platelet ? ?Bilateral leg edema - Plan: CBC with Differential/Platelet, Comprehensive metabolic panel, D-Dimer, Quantitative, cephALEXin (KEFLEX) 500 MG capsule, D-Dimer, Quantitative, Comprehensive metabolic panel, CBC with Differential/Platelet ? ?Low suspicion for DVT.  D-dimer ordered.  CBC and CMET also ordered.  Keflex prescribed.  Follow-up pending results.  She will return to the office next Tuesday for follow-up or sooner if needed. ?

## 2022-04-14 NOTE — Patient Instructions (Signed)
Start the antibiotic as prescribed.  ? ? ?

## 2022-04-15 LAB — D-DIMER, QUANTITATIVE: D-Dimer, Quant: 1.66 mcg/mL FEU — ABNORMAL HIGH (ref <0.50)

## 2022-04-15 NOTE — Progress Notes (Signed)
Her labs are fine and with the D-dimer result adjusted for her age, it is not significant.

## 2022-04-16 ENCOUNTER — Other Ambulatory Visit: Payer: Self-pay | Admitting: Family Medicine

## 2022-04-19 ENCOUNTER — Encounter: Payer: Self-pay | Admitting: Family Medicine

## 2022-04-19 ENCOUNTER — Ambulatory Visit (INDEPENDENT_AMBULATORY_CARE_PROVIDER_SITE_OTHER): Payer: Medicare Other | Admitting: Family Medicine

## 2022-04-19 VITALS — BP 128/58 | HR 73 | Temp 97.6°F | Ht <= 58 in

## 2022-04-19 DIAGNOSIS — L03115 Cellulitis of right lower limb: Secondary | ICD-10-CM | POA: Diagnosis not present

## 2022-04-19 DIAGNOSIS — R600 Localized edema: Secondary | ICD-10-CM

## 2022-04-19 NOTE — Progress Notes (Signed)
? ?  Subjective:  ? ? Patient ID: [name], female    DOB: Oct 10, 1933, 88 y.o.   MRN: [id number] ? ?HPI ?Chief Complaint  ?Patient presents with  ? Follow-up  ?  F/u on cellulitis and leg edema  ? ?She is here for a 1 week follow-up on lower extremity edema and early leg cellulitis.  States she is taking Keflex and reports no side effects.  She denies pain today.  No new concerns. ? ?Labs ordered at her previous visit including D-dimer which was age-adjusted negative. ? ?No fever, chills, dizziness, chest pain, shortness of breath, vomiting or diarrhea. ? ? ?Review of Systems ?Pertinent positives and negatives in the history of present illness. ? ?   ?Objective:  ? Physical Exam ?Constitutional:   ?   General: She is not in acute distress. ?   Appearance: She is not ill-appearing.  ?Cardiovascular:  ?   Rate and Rhythm: Normal rate.  ?Pulmonary:  ?   Effort: Pulmonary effort is normal.  ?Musculoskeletal:  ?   Right lower leg: 1+ Edema present.  ?   Left lower leg: 1+ Pitting Edema present.  ?   Right foot: Normal.  ?   Left foot: Normal.  ?Skin: ?   General: Skin is warm and dry.  ?   Comments: LE with minimal erythema above the ankles to mid shin. Improved from last week.   ?Neurological:  ?   Mental Status: She is alert. Mental status is at baseline.  ?Psychiatric:     ?   Mood and Affect: Mood normal.     ?   Behavior: Behavior normal.  ? ?BP (!) 128/58 (BP Location: Left Arm, Patient Position: Sitting, Cuff Size: Large)   Pulse 73   Temp 97.6 ?F (36.4 ?C) (Temporal)   Ht '4\' 10"'$  (1.473 m)   SpO2 98%   BMI 24.45 kg/m?  ? ? ? ? ?   ?Assessment & Plan:  ?Bilateral leg edema ? ?Cellulitis of right lower extremity ? ?Erythema and edema improving.  Reports feeling at her baseline today.  She will complete the antibiotic as prescribed.  Encouraged her to elevate her legs more often as she is sitting and sleeping in her recliner.  No new concerns today. ?She will follow-up as scheduled with her PCP in May. ? ?

## 2022-04-19 NOTE — Patient Instructions (Signed)
Your legs appear to be improving. ? ?Elevate your legs more often when you are sitting in the recliner. ? ?Finish the antibiotic that you are taking. ? ?Follow-up with Dr. Ronnald Ramp as scheduled in a couple of weeks. ?

## 2022-04-24 ENCOUNTER — Ambulatory Visit (INDEPENDENT_AMBULATORY_CARE_PROVIDER_SITE_OTHER): Payer: Medicare Other | Admitting: Internal Medicine

## 2022-04-24 ENCOUNTER — Encounter: Payer: Self-pay | Admitting: Internal Medicine

## 2022-04-24 VITALS — BP 116/60 | HR 83 | Temp 98.3°F | Ht <= 58 in | Wt 116.0 lb

## 2022-04-24 DIAGNOSIS — I872 Venous insufficiency (chronic) (peripheral): Secondary | ICD-10-CM | POA: Diagnosis not present

## 2022-04-24 DIAGNOSIS — R600 Localized edema: Secondary | ICD-10-CM | POA: Diagnosis not present

## 2022-04-24 DIAGNOSIS — I10 Essential (primary) hypertension: Secondary | ICD-10-CM | POA: Diagnosis not present

## 2022-04-24 MED ORDER — POTASSIUM CHLORIDE CRYS ER 20 MEQ PO TBCR: 20.0000 meq | EXTENDED_RELEASE_TABLET | Freq: Every day | ORAL | 3 refills | Status: DC

## 2022-04-24 MED ORDER — HYDROCHLOROTHIAZIDE 25 MG PO TABS: 25.0000 mg | ORAL_TABLET | Freq: Every day | ORAL | 3 refills | Status: DC

## 2022-04-24 MED ORDER — METOPROLOL SUCCINATE ER 25 MG PO TB24: 25.0000 mg | ORAL_TABLET | Freq: Every day | ORAL | 3 refills | Status: DC

## 2022-04-24 NOTE — Progress Notes (Signed)
? ? ?Subjective:  ? ? Patient ID: [name], female    DOB: October 22, 1933, 88 y.o.   MRN: [id number] ? ?This visit occurred during the SARS-CoV-2 public health emergency.  Safety protocols were in place, including screening questions prior to the visit, additional usage of staff PPE, and extensive cleaning of exam room while observing appropriate contact time as indicated for disinfecting solutions. ? ? ? ?HPI ?[name] is here for  ?Chief Complaint  ?Patient presents with  ? Blister  ?  Right leg blisters  ? ?She is here today with her daughter.  She was here 4/21 and 4/26 for bilateral leg edema and possible cellulitis of the right lower extremity.  She took the prescribed Keflex, but did not see a lot of improvement. ? ?She states swelling in her bilateral legs for a while-months.  And may have gotten worse, but neither her or her daughter can tell me when they noticed the increased swelling.  She was placed on a water pill-HCTZ 12.5 mg 3 times a week by her previous primary care physician. ? ?She states no darkness in her bilateral lower legs and some mild redness.  Over the weekend she developed blisters on the right lower leg.  Her legs are little itchy, but denies pain.  She denies any fever.  She is active throughout the day.  She does elevate her legs sometimes during the day when she sits, but not as much as she should.  The leg swelling does go down significantly overnight.  Gets worse as the day goes on. ? ? ? ? ? ?Medications and allergies reviewed with patient and updated if appropriate. ? ?Current Outpatient Medications on File Prior to Visit  ?Medication Sig Dispense Refill  ? cephALEXin (KEFLEX) 500 MG capsule Take 1 capsule (500 mg total) by mouth 2 (two) times daily. 10 capsule 0  ? ferrous sulfate (FEROSUL) 325 (65 FE) MG tablet Take 1 tablet (325 mg total) by mouth daily with breakfast. 90 tablet 1  ? gabapentin (NEURONTIN) 400 MG capsule Take 1 capsule (400 mg total) by mouth at bedtime. 90 capsule  1  ? gabapentin (NEURONTIN) 800 MG tablet Take by mouth daily.    ? hydrochlorothiazide (MICROZIDE) 12.5 MG capsule TAKE ONE CAPSULE BY MOUTH THREE TIMES A WEEK 90 capsule 3  ? hydrocortisone 2.5 % ointment APPLY EXTERNALLY TO THE AFFECTED AREA TWICE DAILY 28.35 g 0  ? latanoprost (XALATAN) 0.005 % ophthalmic solution INT 1 GTT IN OD QD    ? lidocaine (LIDODERM) 5 % Place 1 patch onto the skin daily as needed. Apply patch to area most significant pain once per day.  Remove and discard patch within 12 hours of application. 30 patch 1  ? metoprolol succinate (TOPROL-XL) 50 MG 24 hr tablet TAKE 1 TABLET BY MOUTH EVERY DAY WITH OR IMMEDIATELY FOLLOWING A MEAL 90 tablet 1  ? nystatin cream (MYCOSTATIN) Apply to external affected area twice a day for 10 days as needed. 30 g 1  ? ondansetron (ZOFRAN ODT) 4 MG disintegrating tablet Take 1 tablet (4 mg total) by mouth every 8 (eight) hours as needed for nausea or vomiting. 30 tablet 2  ? pantoprazole (PROTONIX) 40 MG tablet TAKE 1 TABLET BY MOUTH ONCE TO TWICE DAILY 90 tablet 1  ? polyethylene glycol powder (GLYCOLAX/MIRALAX) 17 GM/SCOOP powder Take 17 g by mouth daily. 3350 g 2  ? promethazine (PHENERGAN) 12.5 MG tablet TAKE 1 TABLET BY MOUTH EVERY 6 HOURS AS NEEDED FOR NAUSEA  OR VOMITING 30 tablet 6  ? traMADol-acetaminophen (ULTRACET) 37.5-325 MG tablet Take 1 tablet by mouth every 6 (six) hours as needed. 60 tablet 5  ? verapamil (CALAN-SR) 240 MG CR tablet Take 1 tablet (240 mg total) by mouth daily. 90 tablet 3  ? ?No current facility-administered medications on file prior to visit.  ? ? ?Review of Systems  ?Constitutional:  Negative for fever.  ?Respiratory:  Negative for cough, shortness of breath and wheezing.   ?Cardiovascular:  Positive for leg swelling. Negative for chest pain and palpitations.  ?Neurological:  Positive for headaches (occ). Negative for light-headedness.  ? ?   ?Objective:  ? ?Vitals:  ? 04/24/22 1515  ?BP: 116/60  ?Pulse: 83  ?Temp: 98.3 ?F  (36.8 ?C)  ?SpO2: 96%  ? ?BP Readings from Last 3 Encounters:  ?04/24/22 116/60  ?04/19/22 (!) 128/58  ?04/14/22 116/62  ? ?Wt Readings from Last 3 Encounters:  ?04/24/22 116 lb (52.6 kg)  ?11/07/21 117 lb (53.1 kg)  ?07/13/21 116 lb 12.8 oz (53 kg)  ? ?Body mass index is 24.24 kg/m?. ? ?  ?Physical Exam ?Constitutional:   ?   General: She is not in acute distress. ?   Appearance: Normal appearance.  ?HENT:  ?   Head: Normocephalic and atraumatic.  ?Eyes:  ?   Conjunctiva/sclera: Conjunctivae normal.  ?Cardiovascular:  ?   Rate and Rhythm: Normal rate and regular rhythm.  ?   Heart sounds: Normal heart sounds. No murmur heard. ?Pulmonary:  ?   Effort: Pulmonary effort is normal. No respiratory distress.  ?   Breath sounds: Normal breath sounds. No wheezing.  ?Musculoskeletal:  ?   Cervical back: Neck supple.  ?   Right lower leg: Edema (1 + pitting edema 1/2 way up leg) present.  ?   Left lower leg: Edema (1 + pitting edema 1/2 way up leg) present.  ?Lymphadenopathy:  ?   Cervical: No cervical adenopathy.  ?Skin: ?   General: Skin is warm and dry.  ?   Findings: Erythema (mild erythem RLE> LLE) present. No rash.  ?   Comments: Two blisters right anterior lower leg, one blister has popped - mild ulceration with clear fluid  ?Neurological:  ?   Mental Status: She is alert. Mental status is at baseline.  ?Psychiatric:     ?   Mood and Affect: Mood normal.     ?   Behavior: Behavior normal.  ? ?   ? ? ? ? ? ?Assessment & Plan:  ? ? ?Bilateral leg edema, venous stasis dermatitis: ?Acute on chronic ?Has had swelling for a while, but it is worse and she has discoloration in her lower legs and more recently blisters right anterior lower leg with clear fluid ?No evidence of infection-discoloration likely related to venous stasis dermatitis ?Will increase hydrochlorothiazide to 25 mg daily ?Start Klor-Con 20 mill equivalents daily ?Elevate legs whenever sitting, but continue to get up and walk around which also helps the  swelling ?Low-sodium diet ?Discussed considering compression socks in the future, but not sure if she would be able to put them on herself ?Has follow-up with PCP 5/9 ? ?Hypertension: ?Well-controlled ?Legs are swollen and increased HCTZ from 12.5 mg 3 times weekly to 25 mg daily ?Potassium chloride 20 mill equivalents daily started ?We will decrease metoprolol XL to 25 mg daily ?Continue diltiazem to 40 mg daily ?Has follow-up with PCP 5/9 for reevaluation/blood work ?She is not able to check her BP at home ?  Advised to call with any concerning symptoms lightheadedness or dizziness ? ? ? ?

## 2022-04-24 NOTE — Patient Instructions (Addendum)
? ? ? ? ?  Medications changes include :    ?increase water pill - hydrochlorothiazide to 25 mg daily ?Start potassium chloride pill daily 20 meq ?Decrease metoprolol xl to 25 mg daily ? ? ?Your prescription(s) have been sent to your pharmacy.  ? ? ? ? ?Return for follow up as scheduled. ? ?

## 2022-04-28 ENCOUNTER — Ambulatory Visit (INDEPENDENT_AMBULATORY_CARE_PROVIDER_SITE_OTHER): Payer: Medicare Other | Admitting: Podiatry

## 2022-04-28 ENCOUNTER — Encounter: Payer: Self-pay | Admitting: Podiatry

## 2022-04-28 DIAGNOSIS — B351 Tinea unguium: Secondary | ICD-10-CM

## 2022-04-28 DIAGNOSIS — T1490XD Injury, unspecified, subsequent encounter: Secondary | ICD-10-CM

## 2022-04-28 DIAGNOSIS — M79676 Pain in unspecified toe(s): Secondary | ICD-10-CM

## 2022-04-28 DIAGNOSIS — L84 Corns and callosities: Secondary | ICD-10-CM

## 2022-04-29 NOTE — Progress Notes (Signed)
?  Subjective:  ?Patient ID: [name], female    DOB: 1933-12-15,  MRN: [id number] ? ?[name] presents to clinic today for callus(es) right lower extremity and painful thick toenails that are difficult to trim. Painful toenails interfere with ambulation. Aggravating factors include wearing enclosed shoe gear. Pain is relieved with periodic professional debridement. Painful calluses are aggravated when weightbearing with and without shoegear. Pain is relieved with periodic professional debridement. ? ?Patient states she has a sore on her leg and is unsure how it developed. She denies any recent falls. Chart review reveals recent episode of LE cellulitis with blister formation treated by Internal Medicine. She was given a Rx for cephalexin and presented an empty pill bottle so she has completed prescribed antibiotics. She states she has an upcoming appointment on Tuesday for follow up of this problem. ? ? ? ? ?She states she has compression hose, but cannot get them on with her arthritis in her hands. ? ?PCP is Janith Lima, MD , and last visit was November 07, 2021. ? ?No Known Allergies ? ?Review of Systems: Negative except as noted in the HPI. ? ?Objective: No changes noted in today's physical examination. ?General: Patient is an extremely pleasant 88 y.o. African American female frail, in NAD. AAO x 3.  ? ?Neurovascular Examination: ?CFT <3 seconds b/l LE. Faintly palpable pedal pulses b/l LE. Pedal hair absent b/l LE. Skin temperature gradient WNL b/l. No pain with calf compression b/l. Trace edema BLE. No cyanosis or clubbing noted b/l LE. ? ?Protective sensation intact 5/5 intact bilaterally with 10g monofilament b/l. ? ?Dermatological:  ?Pedal skin thin and atrophic b/l LE. Plantar fat pad atrophy of forefoot and bilateral heels. She has healing blisters on the anterior shin of her right shin; also healing excoriation as well. No erythema, no edema, no weeping. They seem to be healing. Area is clean.  No warmth nor fluctuance.  ? ?No interdigital macerations noted b/l LE. Toenails 1-5 b/l elongated, discolored, dystrophic, thickened, crumbly with subungual debris and tenderness to dorsal palpation. Hyperkeratotic lesion(s) plantar aspect of heel right foot.  No erythema, no edema, no drainage, no fluctuance. ? ?Musculoskeletal:  ?HAV with bunion deformity noted b/l LE. Hammertoe deformity noted 2-5 b/l. Patient in wheelchair on today's visit. Muscle strength 4/5 b/l. Wearing adequate shoe gear which accommodates her pedal deformities. ? ?Assessment/Plan: ?1. Pain due to onychomycosis of toenail   ?2. Callus   ?3. Healing wound   ?  ?-Patient was evaluated and treated. All patient's and/or POA's questions/concerns answered on today's visit. ?-Patient with recent episode of cellulitis of RLE treated by Internal Medicine with round of cephalexin. No evidence of cellulitis today. Reassured patient. She is to continue instructions per Internal Medicine and she has follow up appointment with Dr. Scarlette Calico on Tuesday. ?-Medicare ABN signed. Patient consents for services of paring of corn(s)/callus(es)/porokeratos(es) today. Copy in patient chart. ?-Toenails 1-5 b/l were debrided in length and girth with sterile nail nippers and dremel without iatrogenic bleeding.  ?-Callus(es) plantar heel pad of right foot pared utilizing mandrel sander without complication or incident. Total number debrided =1. ?-Patient/POA to call should there be question/concern in the interim.  ? ?Return in about 3 months (around 07/29/2022). ? ?Marzetta Board, DPM  ?

## 2022-05-02 ENCOUNTER — Ambulatory Visit: Payer: Medicare Other

## 2022-05-02 ENCOUNTER — Encounter: Payer: Self-pay | Admitting: Internal Medicine

## 2022-05-02 ENCOUNTER — Ambulatory Visit (INDEPENDENT_AMBULATORY_CARE_PROVIDER_SITE_OTHER): Payer: Medicare Other

## 2022-05-02 ENCOUNTER — Ambulatory Visit (INDEPENDENT_AMBULATORY_CARE_PROVIDER_SITE_OTHER): Payer: Medicare Other | Admitting: Internal Medicine

## 2022-05-02 VITALS — BP 120/62 | HR 71 | Temp 98.2°F | Resp 16 | Ht <= 58 in

## 2022-05-02 VITALS — BP 120/62 | HR 71 | Temp 98.2°F | Ht <= 58 in

## 2022-05-02 DIAGNOSIS — Z0000 Encounter for general adult medical examination without abnormal findings: Secondary | ICD-10-CM

## 2022-05-02 DIAGNOSIS — S80811A Abrasion, right lower leg, initial encounter: Secondary | ICD-10-CM

## 2022-05-02 DIAGNOSIS — K219 Gastro-esophageal reflux disease without esophagitis: Secondary | ICD-10-CM

## 2022-05-02 DIAGNOSIS — I10 Essential (primary) hypertension: Secondary | ICD-10-CM

## 2022-05-02 MED ORDER — ALUM HYDROXIDE-MAG TRISILICATE 80-20 MG PO CHEW: 1.0000 | CHEWABLE_TABLET | Freq: Three times a day (TID) | ORAL | 1 refills | Status: DC | PRN

## 2022-05-02 NOTE — Patient Instructions (Signed)
[name] , ?Thank you for taking time to come for your Medicare Wellness Visit. I appreciate your ongoing commitment to your health goals. Please review the following plan we discussed and let me know if I can assist you in the future.  ? ?Screening recommendations/referrals: ?Colonoscopy: Discontinued due to age ?Mammogram: 07/12/2021; due every year ?Bone Density: 10/20/2019; due every 2-3 years ?Recommended yearly ophthalmology/optometry visit for glaucoma screening and checkup ?Recommended yearly dental visit for hygiene and checkup ? ?Vaccinations: ?Influenza vaccine: 09/30/2021 ?Pneumococcal vaccine: 07/21/2014, 11/04/2016 ?Tdap vaccine: 04/28/2014; due every 10 years ?Shingles vaccine: 01/02/2018, 10/06/2019   ?Covid-19: 02/06/2020, 03/05/2020, 11/12/2020, 04/15/2021, 09/30/2021 ? ?Advanced directives: Yes; documents on file ? ?Conditions/risks identified: Yes ? ?Next appointment: Please schedule your next Medicare Wellness Visit with your Nurse Health Advisor in 1 year by calling [phone]. ? ? ?Preventive Care 65 Years and Older, Female ?Preventive care refers to lifestyle choices and visits with your health care provider that can promote health and wellness. ?What does preventive care include? ?A yearly physical exam. This is also called an annual well check. ?Dental exams once or twice a year. ?Routine eye exams. Ask your health care provider how often you should have your eyes checked. ?Personal lifestyle choices, including: ?Daily care of your teeth and gums. ?Regular physical activity. ?Eating a healthy diet. ?Avoiding tobacco and drug use. ?Limiting alcohol use. ?Practicing safe sex. ?Taking low-dose aspirin every day. ?Taking vitamin and mineral supplements as recommended by your health care provider. ?What happens during an annual well check? ?The services and screenings done by your health care provider during your annual well check will depend on your age, overall health, lifestyle risk factors, and  family history of disease. ?Counseling  ?Your health care provider may ask you questions about your: ?Alcohol use. ?Tobacco use. ?Drug use. ?Emotional well-being. ?Home and relationship well-being. ?Sexual activity. ?Eating habits. ?History of falls. ?Memory and ability to understand (cognition). ?Work and work Statistician. ?Reproductive health. ?Screening  ?You may have the following tests or measurements: ?Height, weight, and BMI. ?Blood pressure. ?Lipid and cholesterol levels. These may be checked every 5 years, or more frequently if you are over 10 years old. ?Skin check. ?Lung cancer screening. You may have this screening every year starting at age 75 if you have a 30-pack-year history of smoking and currently smoke or have quit within the past 15 years. ?Fecal occult blood test (FOBT) of the stool. You may have this test every year starting at age 95. ?Flexible sigmoidoscopy or colonoscopy. You may have a sigmoidoscopy every 5 years or a colonoscopy every 10 years starting at age 91. ?Hepatitis C blood test. ?Hepatitis B blood test. ?Sexually transmitted disease (STD) testing. ?Diabetes screening. This is done by checking your blood sugar (glucose) after you have not eaten for a while (fasting). You may have this done every 1-3 years. ?Bone density scan. This is done to screen for osteoporosis. You may have this done starting at age 63. ?Mammogram. This may be done every 1-2 years. Talk to your health care provider about how often you should have regular mammograms. ?Talk with your health care provider about your test results, treatment options, and if necessary, the need for more tests. ?Vaccines  ?Your health care provider may recommend certain vaccines, such as: ?Influenza vaccine. This is recommended every year. ?Tetanus, diphtheria, and acellular pertussis (Tdap, Td) vaccine. You may need a Td booster every 10 years. ?Zoster vaccine. You may need this after age 93. ?Pneumococcal 13-valent conjugate  (  PCV13) vaccine. One dose is recommended after age 30. ?Pneumococcal polysaccharide (PPSV23) vaccine. One dose is recommended after age 23. ?Talk to your health care provider about which screenings and vaccines you need and how often you need them. ?This information is not intended to replace advice given to you by your health care provider. Make sure you discuss any questions you have with your health care provider. ?Document Released: 01/07/2016 Document Revised: 08/30/2016 Document Reviewed: 10/12/2015 ?Elsevier Interactive Patient Education ? 2017 [place]. ? ?Fall Prevention in the Home ?Falls can cause injuries. They can happen to people of all ages. There are many things you can do to make your home safe and to help prevent falls. ?What can I do on the outside of my home? ?Regularly fix the edges of walkways and driveways and fix any cracks. ?Remove anything that might make you trip as you walk through a door, such as a raised step or threshold. ?Trim any bushes or trees on the path to your home. ?Use bright outdoor lighting. ?Clear any walking paths of anything that might make someone trip, such as rocks or tools. ?Regularly check to see if handrails are loose or broken. Make sure that both sides of any steps have handrails. ?Any raised decks and porches should have guardrails on the edges. ?Have any leaves, snow, or ice cleared regularly. ?Use sand or salt on walking paths during winter. ?Clean up any spills in your garage right away. This includes oil or grease spills. ?What can I do in the bathroom? ?Use night lights. ?Install grab bars by the toilet and in the tub and shower. Do not use towel bars as grab bars. ?Use non-skid mats or decals in the tub or shower. ?If you need to sit down in the shower, use a plastic, non-slip stool. ?Keep the floor dry. Clean up any water that spills on the floor as soon as it happens. ?Remove soap buildup in the tub or shower regularly. ?Attach bath mats securely with  double-sided non-slip rug tape. ?Do not have throw rugs and other things on the floor that can make you trip. ?What can I do in the bedroom? ?Use night lights. ?Make sure that you have a light by your bed that is easy to reach. ?Do not use any sheets or blankets that are too big for your bed. They should not hang down onto the floor. ?Have a firm chair that has side arms. You can use this for support while you get dressed. ?Do not have throw rugs and other things on the floor that can make you trip. ?What can I do in the kitchen? ?Clean up any spills right away. ?Avoid walking on wet floors. ?Keep items that you use a lot in easy-to-reach places. ?If you need to reach something above you, use a strong step stool that has a grab bar. ?Keep electrical cords out of the way. ?Do not use floor polish or wax that makes floors slippery. If you must use wax, use non-skid floor wax. ?Do not have throw rugs and other things on the floor that can make you trip. ?What can I do with my stairs? ?Do not leave any items on the stairs. ?Make sure that there are handrails on both sides of the stairs and use them. Fix handrails that are broken or loose. Make sure that handrails are as long as the stairways. ?Check any carpeting to make sure that it is firmly attached to the stairs. Fix any carpet that is loose  or worn. ?Avoid having throw rugs at the top or bottom of the stairs. If you do have throw rugs, attach them to the floor with carpet tape. ?Make sure that you have a light switch at the top of the stairs and the bottom of the stairs. If you do not have them, ask someone to add them for you. ?What else can I do to help prevent falls? ?Wear shoes that: ?Do not have high heels. ?Have rubber bottoms. ?Are comfortable and fit you well. ?Are closed at the toe. Do not wear sandals. ?If you use a stepladder: ?Make sure that it is fully opened. Do not climb a closed stepladder. ?Make sure that both sides of the stepladder are locked  into place. ?Ask someone to hold it for you, if possible. ?Clearly mark and make sure that you can see: ?Any grab bars or handrails. ?First and last steps. ?Where the edge of each step is. ?Use tools that help you

## 2022-05-02 NOTE — Progress Notes (Signed)
? ? ?Subjective:  ?Patient ID: [name], female    DOB: 1933/02/25  Age: 88 y.o. MRN: [id number] ? ?CC: Gastroesophageal Reflux ? ? ?HPI ?[name] presents for f/up - ? ?She continues to complain of heartburn despite taking the PPI.  She denies odynophagia, dysphagia, chest pain, or shortness of breath.  She has had an abrasion on her right lower leg that she is concerned about.  It does not bother her.  She does not know how it happened. ? ?Outpatient Medications Prior to Visit  ?Medication Sig Dispense Refill  ? ferrous sulfate (FEROSUL) 325 (65 FE) MG tablet Take 1 tablet (325 mg total) by mouth daily with breakfast. 90 tablet 1  ? gabapentin (NEURONTIN) 400 MG capsule Take 1 capsule (400 mg total) by mouth at bedtime. 90 capsule 1  ? gabapentin (NEURONTIN) 800 MG tablet Take by mouth daily.    ? hydrochlorothiazide (HYDRODIURIL) 25 MG tablet Take 1 tablet (25 mg total) by mouth daily. 90 tablet 3  ? hydrocortisone 2.5 % ointment APPLY EXTERNALLY TO THE AFFECTED AREA TWICE DAILY 28.35 g 0  ? latanoprost (XALATAN) 0.005 % ophthalmic solution INT 1 GTT IN OD QD    ? lidocaine (LIDODERM) 5 % Place 1 patch onto the skin daily as needed. Apply patch to area most significant pain once per day.  Remove and discard patch within 12 hours of application. 30 patch 1  ? metoprolol succinate (TOPROL-XL) 25 MG 24 hr tablet Take 1 tablet (25 mg total) by mouth daily. 90 tablet 3  ? nystatin cream (MYCOSTATIN) Apply to external affected area twice a day for 10 days as needed. 30 g 1  ? ondansetron (ZOFRAN ODT) 4 MG disintegrating tablet Take 1 tablet (4 mg total) by mouth every 8 (eight) hours as needed for nausea or vomiting. 30 tablet 2  ? pantoprazole (PROTONIX) 40 MG tablet TAKE 1 TABLET BY MOUTH ONCE TO TWICE DAILY 90 tablet 1  ? polyethylene glycol powder (GLYCOLAX/MIRALAX) 17 GM/SCOOP powder Take 17 g by mouth daily. 3350 g 2  ? potassium chloride SA (KLOR-CON M) 20 MEQ tablet Take 1 tablet (20 mEq total) by mouth  daily. 30 tablet 3  ? promethazine (PHENERGAN) 12.5 MG tablet TAKE 1 TABLET BY MOUTH EVERY 6 HOURS AS NEEDED FOR NAUSEA OR VOMITING 30 tablet 6  ? traMADol-acetaminophen (ULTRACET) 37.5-325 MG tablet Take 1 tablet by mouth every 6 (six) hours as needed. 60 tablet 5  ? verapamil (CALAN-SR) 240 MG CR tablet Take 1 tablet (240 mg total) by mouth daily. 90 tablet 3  ? ?No facility-administered medications prior to visit.  ? ? ?ROS ?Review of Systems  ?Constitutional:  Negative for appetite change, diaphoresis, fatigue and unexpected weight change.  ?HENT:  Negative for trouble swallowing.   ?     +++ heartburn  ?Respiratory: Negative.    ?Cardiovascular:  Negative for chest pain, palpitations and leg swelling.  ?Gastrointestinal:  Negative for abdominal pain and constipation.  ?Genitourinary: Negative.   ?Musculoskeletal: Negative.   ?Skin:  Positive for wound.  ?Neurological:  Negative for dizziness, weakness and light-headedness.  ?Hematological:  Negative for adenopathy. Does not bruise/bleed easily.  ?Psychiatric/Behavioral: Negative.    ? ?Objective:  ?BP 120/62 (BP Location: Right Arm, Patient Position: Sitting, Cuff Size: Normal)   Pulse 71   Temp 98.2 ?F (36.8 ?C) (Oral)   Resp 16   Ht '4\' 10"'$  (1.473 m)   SpO2 92%   BMI 24.24 kg/m?  ? ?BP Readings  from Last 3 Encounters:  ?05/02/22 120/62  ?05/02/22 120/62  ?04/24/22 116/60  ? ? ?Wt Readings from Last 3 Encounters:  ?04/24/22 116 lb (52.6 kg)  ?11/07/21 117 lb (53.1 kg)  ?07/13/21 116 lb 12.8 oz (53 kg)  ? ? ?Physical Exam ?Musculoskeletal:  ?   Right lower leg: Laceration present. No swelling, deformity, tenderness or bony tenderness. No edema.  ?   Left lower leg: No swelling, deformity, lacerations, tenderness or bony tenderness. No edema.  ?     Legs: ? ? ? ?Lab Results  ?Component Value Date  ? WBC 5.0 04/14/2022  ? HGB 12.3 04/14/2022  ? HCT 37.1 04/14/2022  ? PLT 296.0 04/14/2022  ? GLUCOSE 95 04/14/2022  ? CHOL 175 11/03/2019  ? TRIG 79 11/03/2019   ? HDL 82 11/03/2019  ? [employer] 78 11/03/2019  ? ALT 10 04/14/2022  ? AST 16 04/14/2022  ? NA 142 04/14/2022  ? K 3.7 04/14/2022  ? CL 104 04/14/2022  ? CREATININE 0.82 04/14/2022  ? BUN 16 04/14/2022  ? CO2 31 04/14/2022  ? TSH 1.86 05/05/2021  ? HGBA1C 5.4 05/30/2017  ? ? ?CT Thoracic Spine Wo Contrast ? ?Result Date: 12/11/2020 ?CLINICAL DATA:  Mid back pain EXAM: CT THORACIC AND LUMBAR SPINE WITHOUT CONTRAST TECHNIQUE: Multidetector CT imaging of the thoracic and lumbar spine was performed without contrast. Multiplanar CT image reconstructions were also generated. COMPARISON:  Cervical CT 03/18/2018 FINDINGS: CT THORACIC SPINE FINDINGS Alignment: Mild cervicothoracic curvature convex to the right. Mild thoracic curvature convex to the left. Vertebrae: No evidence of fracture or focal bone lesion. Paraspinal and other soft tissues: Hiatal hernia. Disc levels: Degenerative disc disease throughout the thoracic region with disc space narrowing and vacuum phenomenon. Annular bulging to a minor degree from T6-7 through T11-12. No evidence of soft disc herniation or compressive stenosis of the canal or foramina. Ordinary mild facet osteoarthritis and costovertebral osteoarthritis. No advanced finding. CT LUMBAR SPINE FINDINGS Segmentation: 5 lumbar type vertebral bodies. Alignment: Thoracolumbar curvature convex to the left and lower lumbar curvature convex to the right. Vertebrae: No fracture or primary bone lesion. Chronic discogenic endplate changes with sclerosis and cyst formation. Paraspinal and other soft tissues: Aortic atherosclerosis without evidence of aneurysm. Disc levels: L1-2: Disc degeneration with vacuum phenomenon. Mild bulging of the disc. No compressive stenosis. L2-3: Disc degeneration of vacuum phenomenon. Endplate osteophytes and mild bulging of the disc. Facet osteoarthritis. Right foraminal narrowing. L3-4: Disc degeneration with vacuum phenomenon. Endplate osteophytes and bulging of the disc.  Facet osteoarthritis. No compressive stenosis. L4-5: Disc degeneration with loss of height and vacuum phenomenon. Small endplate osteophytes. Facet osteoarthritis. No compressive canal or foraminal narrowing. L5-S1: Disc degeneration with loss of disc height and vacuum phenomenon. Endplate osteophytes. Facet osteoarthritis. No apparent compressive stenosis. Bilateral sacroiliac osteoarthritis is present. IMPRESSION: Spinal curvature. Chronic degenerative disc disease throughout the thoracic and lumbar region, most pronounced in the lumbar region where there is advanced loss of disc height and vacuum phenomenon throughout. The findings in general could certainly relate to chronic back pain. There is no evidence of a compression fracture, either old or acute. The patient does not appear to have compressive stenosis of the thoracic or lumbar spinal canal. There is facet osteoarthritis at the region which also could contribute to back pain. There is bilateral sacroiliac osteoarthritis which could be painful. Electronically Signed   By: Nelson Chimes M.D.   On: 12/11/2020 18:46  ? ?CT Lumbar Spine Wo Contrast ? ?Result  Date: 12/11/2020 ?CLINICAL DATA:  Mid back pain EXAM: CT THORACIC AND LUMBAR SPINE WITHOUT CONTRAST TECHNIQUE: Multidetector CT imaging of the thoracic and lumbar spine was performed without contrast. Multiplanar CT image reconstructions were also generated. COMPARISON:  Cervical CT 03/18/2018 FINDINGS: CT THORACIC SPINE FINDINGS Alignment: Mild cervicothoracic curvature convex to the right. Mild thoracic curvature convex to the left. Vertebrae: No evidence of fracture or focal bone lesion. Paraspinal and other soft tissues: Hiatal hernia. Disc levels: Degenerative disc disease throughout the thoracic region with disc space narrowing and vacuum phenomenon. Annular bulging to a minor degree from T6-7 through T11-12. No evidence of soft disc herniation or compressive stenosis of the canal or foramina.  Ordinary mild facet osteoarthritis and costovertebral osteoarthritis. No advanced finding. CT LUMBAR SPINE FINDINGS Segmentation: 5 lumbar type vertebral bodies. Alignment: Thoracolumbar curvature convex to the left

## 2022-05-02 NOTE — Progress Notes (Addendum)
? ?Subjective:  ? [name] is a 88 y.o. female who presents for Medicare Annual (Subsequent) preventive examination. ? ?Review of Systems    ? ?Cardiac Risk Factors include: advanced age (>44mn, >>33women);hypertension;family history of premature cardiovascular disease ? ?   ?Objective:  ?  ?Today's Vitals  ? 05/02/22 1446  ?BP: 120/62  ?Pulse: 71  ?Temp: 98.2 ?F (36.8 ?C)  ?SpO2: 92%  ?Height: '4\' 10"'$  (1.473 m)  ?PainSc: 0-No pain  ? ?Body mass index is 24.24 kg/m?. ? ? ?  05/02/2022  ?  2:50 PM 12/11/2020  ? 11:28 AM 04/01/2020  ? 11:12 AM 07/23/2019  ? 11:26 AM 09/17/2018  ?  7:40 PM 09/13/2018  ?  1:21 PM 08/24/2018  ?  4:46 AM  ?Advanced Directives  ?Does Patient Have a Medical Advance Directive? Yes No No No No No No  ?Type of Advance Directive Living will;Healthcare Power of Attorney        ?Does patient want to make changes to medical advance directive? No - Patient declined        ?[place] Yes - validated most recent copy scanned in chart (See row information)        ?Would patient like information on creating a medical advance directive?  No - Patient declined Yes (ED - Information included in AVS) No - Patient declined No - Patient declined No - Patient declined Yes (ED - Information included in AVS)  ? ? ?Current Medications (verified) ?Outpatient Encounter Medications as of 05/02/2022  ?Medication Sig  ? ferrous sulfate (FEROSUL) 325 (65 FE) MG tablet Take 1 tablet (325 mg total) by mouth daily with breakfast.  ? gabapentin (NEURONTIN) 400 MG capsule Take 1 capsule (400 mg total) by mouth at bedtime.  ? gabapentin (NEURONTIN) 800 MG tablet Take by mouth daily.  ? hydrochlorothiazide (HYDRODIURIL) 25 MG tablet Take 1 tablet (25 mg total) by mouth daily.  ? hydrocortisone 2.5 % ointment APPLY EXTERNALLY TO THE AFFECTED AREA TWICE DAILY  ? latanoprost (XALATAN) 0.005 % ophthalmic solution INT 1 GTT IN OD QD  ? lidocaine (LIDODERM) 5 % Place 1 patch onto the skin daily as  needed. Apply patch to area most significant pain once per day.  Remove and discard patch within 12 hours of application.  ? metoprolol succinate (TOPROL-XL) 25 MG 24 hr tablet Take 1 tablet (25 mg total) by mouth daily.  ? nystatin cream (MYCOSTATIN) Apply to external affected area twice a day for 10 days as needed.  ? ondansetron (ZOFRAN ODT) 4 MG disintegrating tablet Take 1 tablet (4 mg total) by mouth every 8 (eight) hours as needed for nausea or vomiting.  ? pantoprazole (PROTONIX) 40 MG tablet TAKE 1 TABLET BY MOUTH ONCE TO TWICE DAILY  ? polyethylene glycol powder (GLYCOLAX/MIRALAX) 17 GM/SCOOP powder Take 17 g by mouth daily.  ? potassium chloride SA (KLOR-CON M) 20 MEQ tablet Take 1 tablet (20 mEq total) by mouth daily.  ? promethazine (PHENERGAN) 12.5 MG tablet TAKE 1 TABLET BY MOUTH EVERY 6 HOURS AS NEEDED FOR NAUSEA OR VOMITING  ? traMADol-acetaminophen (ULTRACET) 37.5-325 MG tablet Take 1 tablet by mouth every 6 (six) hours as needed.  ? verapamil (CALAN-SR) 240 MG CR tablet Take 1 tablet (240 mg total) by mouth daily.  ? ?No facility-administered encounter medications on file as of 05/02/2022.  ? ? ?Allergies (verified) ?Patient has no known allergies.  ? ?History: ?Past Medical History:  ?Diagnosis Date  ?  Abnormal finding on Pap smear, ASCUS   ? Acute renal insufficiency   ? Amputation finger   ? index  ? Barrett's esophagus   ? CAD (coronary artery disease)   ? Dementia ([place])   ? Diverticulosis   ? Elevated BP   ? Foot deformity   ? GERD (gastroesophageal reflux disease)   ? Glaucoma   ? H/O: GI bleed   ? HSV-2 (herpes simplex virus 2) infection   ? Hyperglycemia   ? Hypertension   ? Migraine   ? Osteoporosis   ? worsening  ? Spondylosis   ? ?Past Surgical History:  ?Procedure Laterality Date  ? AMPUTATION FINGER / THUMB    ? secondary to osteomyelitis  ? CATARACT EXTRACTION, BILATERAL Bilateral 12/25/2016  ? COLONOSCOPY    ? ESOPHAGOGASTRODUODENOSCOPY N/A 10/22/2016  ? Procedure:  ESOPHAGOGASTRODUODENOSCOPY (EGD);  Surgeon: Doran Stabler, MD;  Location: [facility] ENDOSCOPY;  Service: Endoscopy;  Laterality: N/A;  ? FOOT SURGERY    ? right  ? PARTIAL HYSTERECTOMY    ? UPPER GASTROINTESTINAL ENDOSCOPY    ? ?Family History  ?Problem Relation Age of Onset  ? Breast cancer Mother   ? Heart attack Mother   ? Heart disease Father   ? Alcohol abuse Son   ? Colon cancer Neg Hx   ? Stomach cancer Neg Hx   ? Rectal cancer Neg Hx   ? ?Social History  ? ?Socioeconomic History  ? Marital status: Single  ?  Spouse name: Not on file  ? Number of children: 1  ? Years of education: Not on file  ? Highest education level: 12th grade  ?Occupational History  ?  Employer: RETIRED  ?Tobacco Use  ? Smoking status: Never  ? Smokeless tobacco: Never  ?Vaping Use  ? Vaping Use: Never used  ?Substance and Sexual Activity  ? Alcohol use: No  ?  Alcohol/week: 0.0 standard drinks  ? Drug use: No  ? Sexual activity: Never  ?  Birth control/protection: Post-menopausal  ?Other Topics Concern  ? Not on file  ?Social History Narrative  ? Marital status: divorced.  ?    Children: 1 son; no grandchildren  ?    Lives: with son  ?    Employment: retired  ?    Tobacco: none  ?    Alcohol: none  ?    Drugs: none  ?     Exercise: none  ?     ADLs:  Independent with ADLs.  Quit driving age 38.  Medical CNA cleans house; makes breakfast; patient pays bills.  ?    Advanced Directives:   YES.  HCPOA:  Son/Isaac Bowman.  FULL CODE; no prolonged measures.  ? ?Social Determinants of Health  ? ?Financial Resource Strain: Low Risk   ? Difficulty of Paying Living Expenses: Not hard at all  ?Food Insecurity: No Food Insecurity  ? Worried About Charity fundraiser in the Last Year: Never true  ? Ran Out of Food in the Last Year: Never true  ?Transportation Needs: No Transportation Needs  ? Lack of Transportation (Medical): No  ? Lack of Transportation (Non-Medical): No  ?Physical Activity: Inactive  ? Days of Exercise per Week: 0 days  ? Minutes of  Exercise per Session: 0 min  ?Stress: No Stress Concern Present  ? Feeling of Stress : Not at all  ?Social Connections: Unknown  ? Frequency of Communication with Friends and Family: Patient refused  ? Frequency of Social Gatherings  with Friends and Family: Patient refused  ? Attends Religious Services: Patient refused  ? Active Member of Clubs or Organizations: Patient refused  ? Attends Archivist Meetings: Patient refused  ? Marital Status: Patient refused  ? ? ?Tobacco Counseling ?Counseling given: Not Answered ? ? ?Clinical Intake: ? ?Pre-visit preparation completed: Yes ? ?Pain : No/denies pain ?Pain Score: 0-No pain ? ?  ? ?Nutritional Risks: None ?Diabetes: No ? ?How often do you need to have someone help you when you read instructions, pamphlets, or other written materials from your doctor or pharmacy?: 1 - Never ? ?Diabetic? no ? ?Interpreter Needed?: No ? ?Information entered by :: Lisette Abu, LPN. ? ? ?Activities of Daily Living ? ?  05/02/2022  ?  2:53 PM 05/05/2021  ?  2:51 PM  ?In your present state of health, do you have any difficulty performing the following activities:  ?Hearing? 1 0  ?Vision? 0 0  ?Difficulty concentrating or making decisions? 1 0  ?Walking or climbing stairs? 1 0  ?Dressing or bathing? 1 0  ?Doing errands, shopping? 1 0  ?Preparing Food and eating ? Y   ?Using the Toilet? N   ?In the past six months, have you accidently leaked urine? Y   ?Do you have problems with loss of bowel control? Y   ?Managing your Medications? Y   ?Managing your Finances? N   ?Housekeeping or managing your Housekeeping? Y   ? ? ?Patient Care Team: ?Janith Lima, MD as PCP - General (Internal Medicine) ?Fay Records, MD (Cardiology) ?Druscilla Brownie, MD (Dermatology) ?[name], MD (Inactive) (Gastroenterology) ?Verdis Frederickson, MD (Inactive) (Obstetrics and Gynecology) ?Clent Jacks, MD (Ophthalmology) ?Roseanne Kaufman, MD (Orthopedic Surgery) ? ?Indicate any recent Medical  Services you may have received from other than [facility] providers in the past year (date may be approximate). ? ?   ?Assessment:  ? This is a routine wellness examination for [place]. ? ?Hearing/Vision screen ?Vision Screenin

## 2022-05-02 NOTE — Patient Instructions (Signed)

## 2022-05-15 ENCOUNTER — Telehealth: Payer: Self-pay | Admitting: Internal Medicine

## 2022-05-15 NOTE — Telephone Encounter (Signed)
Pt was given pantoprazole (PROTONIX) 40 MG tablet and another medication at last visit.   Wants to know if she should be taking both?

## 2022-05-16 NOTE — Telephone Encounter (Signed)
Called pt, LVM to discuss Rx.

## 2022-06-03 ENCOUNTER — Other Ambulatory Visit: Payer: Self-pay | Admitting: Internal Medicine

## 2022-06-03 DIAGNOSIS — K219 Gastro-esophageal reflux disease without esophagitis: Secondary | ICD-10-CM

## 2022-06-20 ENCOUNTER — Encounter: Payer: Self-pay | Admitting: Podiatry

## 2022-06-23 ENCOUNTER — Other Ambulatory Visit: Payer: Self-pay | Admitting: Internal Medicine

## 2022-06-23 DIAGNOSIS — D508 Other iron deficiency anemias: Secondary | ICD-10-CM

## 2022-07-19 ENCOUNTER — Other Ambulatory Visit: Payer: Self-pay | Admitting: Internal Medicine

## 2022-07-26 DIAGNOSIS — M5136 Other intervertebral disc degeneration, lumbar region: Secondary | ICD-10-CM | POA: Diagnosis not present

## 2022-07-31 DIAGNOSIS — Z780 Asymptomatic menopausal state: Secondary | ICD-10-CM | POA: Diagnosis not present

## 2022-07-31 DIAGNOSIS — M8589 Other specified disorders of bone density and structure, multiple sites: Secondary | ICD-10-CM | POA: Diagnosis not present

## 2022-07-31 DIAGNOSIS — Z1231 Encounter for screening mammogram for malignant neoplasm of breast: Secondary | ICD-10-CM | POA: Diagnosis not present

## 2022-08-02 ENCOUNTER — Telehealth: Payer: Self-pay | Admitting: Internal Medicine

## 2022-08-02 MED ORDER — VERAPAMIL HCL ER 240 MG PO TBCR
240.0000 mg | EXTENDED_RELEASE_TABLET | Freq: Every day | ORAL | 1 refills | Status: DC
Start: 2022-08-02 — End: 2023-01-21

## 2022-08-02 NOTE — Telephone Encounter (Signed)
Patient needs her verapimil reilled - please send to [facility] on [address].  Patient's last appointment in May, 2023

## 2022-08-02 NOTE — Telephone Encounter (Signed)
Sent in rx to of.Marland KitchenJohny [name]

## 2022-08-04 ENCOUNTER — Ambulatory Visit: Payer: Medicare Other | Admitting: Podiatry

## 2022-08-07 ENCOUNTER — Encounter: Payer: Self-pay | Admitting: Internal Medicine

## 2022-08-07 ENCOUNTER — Ambulatory Visit (INDEPENDENT_AMBULATORY_CARE_PROVIDER_SITE_OTHER): Payer: Medicare Other

## 2022-08-07 ENCOUNTER — Ambulatory Visit (INDEPENDENT_AMBULATORY_CARE_PROVIDER_SITE_OTHER): Payer: Medicare Other | Admitting: Internal Medicine

## 2022-08-07 VITALS — BP 128/66 | HR 86 | Temp 98.3°F | Ht <= 58 in

## 2022-08-07 DIAGNOSIS — R079 Chest pain, unspecified: Secondary | ICD-10-CM | POA: Diagnosis not present

## 2022-08-07 DIAGNOSIS — R0782 Intercostal pain: Secondary | ICD-10-CM | POA: Diagnosis not present

## 2022-08-07 DIAGNOSIS — S22000A Wedge compression fracture of unspecified thoracic vertebra, initial encounter for closed fracture: Secondary | ICD-10-CM

## 2022-08-07 DIAGNOSIS — S298XXA Other specified injuries of thorax, initial encounter: Secondary | ICD-10-CM | POA: Insufficient documentation

## 2022-08-07 DIAGNOSIS — K449 Diaphragmatic hernia without obstruction or gangrene: Secondary | ICD-10-CM | POA: Diagnosis not present

## 2022-08-07 NOTE — Patient Instructions (Signed)
Chest Wall Pain Chest wall pain is pain in or around the bones and muscles of your chest. Sometimes, an injury causes this pain. Excessive coughing or overuse of arm and chest muscles may also cause chest wall pain. Sometimes, the cause may not be known. This pain may take several weeks or longer to get better. Follow these instructions at home: Managing pain, stiffness, and swelling  If directed, put ice on the painful area: Put ice in a plastic bag. Place a towel between your skin and the bag. Leave the ice on for 20 minutes, 2-3 times per day. Activity Rest as told by your health care provider. Avoid activities that cause pain. These include any activities that use your chest muscles or your abdominal and side muscles to lift heavy items. Ask your health care provider what activities are safe for you. General instructions  Take over-the-counter and prescription medicines only as told by your health care provider. Do not use any products that contain nicotine or tobacco, such as cigarettes, e-cigarettes, and chewing tobacco. These can delay healing after injury. If you need help quitting, ask your health care provider. Keep all follow-up visits as told by your health care provider. This is important. Contact a health care provider if: You have a fever. Your chest pain becomes worse. You have new symptoms. Get help right away if: You have nausea or vomiting. You feel sweaty or light-headed. You have a cough with mucus from your lungs (sputum) or you cough up blood. You develop shortness of breath. These symptoms may represent a serious problem that is an emergency. Do not wait to see if the symptoms will go away. Get medical help right away. Call your local emergency services (911 in the [place] Do not drive yourself to the hospital. Summary Chest wall pain is pain in or around the bones and muscles of your chest. Depending on the cause, it may be treated with ice, rest, medicines, and  avoiding activities that cause pain. Contact a health care provider if you have a fever, worsening chest pain, or new symptoms. Get help right away if you feel light-headed or you develop shortness of breath. These symptoms may be an emergency. This information is not intended to replace advice given to you by your health care provider. Make sure you discuss any questions you have with your health care provider. Document Revised: 02/25/2021 Document Reviewed: 02/25/2021 Elsevier Patient Education  2023 [employer]  

## 2022-08-07 NOTE — Progress Notes (Unsigned)
Subjective:  Patient ID: [name], female    DOB: 04/13/1933  Age: 88 y.o. MRN: [id number]  CC: No chief complaint on file.   HPI Becky Colan presents for ***  Outpatient Medications Prior to Visit  Medication Sig Dispense Refill   alum hydroxide-mag trisilicate (GAVISCON) 44-31 MG CHEW chewable tablet Chew 1 tablet by mouth 3 (three) times daily as needed for indigestion or heartburn. 270 tablet 1   FEROSUL 325 (65 Fe) MG tablet TAKE 1 TABLET(325 MG) BY MOUTH DAILY WITH BREAKFAST 90 tablet 1   gabapentin (NEURONTIN) 400 MG capsule Take 1 capsule (400 mg total) by mouth at bedtime. 90 capsule 1   gabapentin (NEURONTIN) 800 MG tablet TAKE 1 TABLET(800 MG) BY MOUTH AT BEDTIME 90 tablet 1   hydrochlorothiazide (HYDRODIURIL) 25 MG tablet Take 1 tablet (25 mg total) by mouth daily. 90 tablet 3   hydrocortisone 2.5 % ointment APPLY EXTERNALLY TO THE AFFECTED AREA TWICE DAILY 28.35 g 0   latanoprost (XALATAN) 0.005 % ophthalmic solution INT 1 GTT IN OD QD     lidocaine (LIDODERM) 5 % Place 1 patch onto the skin daily as needed. Apply patch to area most significant pain once per day.  Remove and discard patch within 12 hours of application. 30 patch 1   metoprolol succinate (TOPROL-XL) 25 MG 24 hr tablet Take 1 tablet (25 mg total) by mouth daily. 90 tablet 3   nystatin cream (MYCOSTATIN) Apply to external affected area twice a day for 10 days as needed. 30 g 1   ondansetron (ZOFRAN ODT) 4 MG disintegrating tablet Take 1 tablet (4 mg total) by mouth every 8 (eight) hours as needed for nausea or vomiting. 30 tablet 2   pantoprazole (PROTONIX) 40 MG tablet TAKE 1 TABLET BY MOUTH 1 TO 2 TIMES DAILY 90 tablet 1   polyethylene glycol powder (GLYCOLAX/MIRALAX) 17 GM/SCOOP powder Take 17 g by mouth daily. 3350 g 2   potassium chloride SA (KLOR-CON M) 20 MEQ tablet Take 1 tablet (20 mEq total) by mouth daily. 30 tablet 3   promethazine (PHENERGAN) 12.5 MG tablet TAKE 1 TABLET BY MOUTH EVERY 6 HOURS AS  NEEDED FOR NAUSEA OR VOMITING 30 tablet 6   traMADol-acetaminophen (ULTRACET) 37.5-325 MG tablet Take 1 tablet by mouth every 6 (six) hours as needed. 60 tablet 5   verapamil (CALAN-SR) 240 MG CR tablet Take 1 tablet (240 mg total) by mouth daily. 90 tablet 1   No facility-administered medications prior to visit.    ROS Review of Systems  Objective:  BP 128/66 (BP Location: Left Arm, Patient Position: Sitting, Cuff Size: Normal)   Pulse 86   Temp 98.3 F (36.8 C) (Oral)   Ht '4\' 10"'$  (1.473 m)   SpO2 94%   BMI 24.24 kg/m   BP Readings from Last 3 Encounters:  08/07/22 128/66  05/02/22 120/62  05/02/22 120/62    Wt Readings from Last 3 Encounters:  04/24/22 116 lb (52.6 kg)  11/07/21 117 lb (53.1 kg)  07/13/21 116 lb 12.8 oz (53 kg)    Physical Exam  Lab Results  Component Value Date   WBC 5.0 04/14/2022   HGB 12.3 04/14/2022   HCT 37.1 04/14/2022   PLT 296.0 04/14/2022   GLUCOSE 95 04/14/2022   CHOL 175 11/03/2019   TRIG 79 11/03/2019   HDL 82 11/03/2019   LDLCALC 78 11/03/2019   ALT 10 04/14/2022   AST 16 04/14/2022   NA 142 04/14/2022   K  3.7 04/14/2022   CL 104 04/14/2022   CREATININE 0.82 04/14/2022   BUN 16 04/14/2022   CO2 31 04/14/2022   TSH 1.86 05/05/2021   HGBA1C 5.4 05/30/2017    CT Thoracic Spine Wo Contrast  Result Date: 12/11/2020 CLINICAL DATA:  Mid back pain EXAM: CT THORACIC AND LUMBAR SPINE WITHOUT CONTRAST TECHNIQUE: Multidetector CT imaging of the thoracic and lumbar spine was performed without contrast. Multiplanar CT image reconstructions were also generated. COMPARISON:  Cervical CT 03/18/2018 FINDINGS: CT THORACIC SPINE FINDINGS Alignment: Mild cervicothoracic curvature convex to the right. Mild thoracic curvature convex to the left. Vertebrae: No evidence of fracture or focal bone lesion. Paraspinal and other soft tissues: Hiatal hernia. Disc levels: Degenerative disc disease throughout the thoracic region with disc space narrowing  and vacuum phenomenon. Annular bulging to a minor degree from T6-7 through T11-12. No evidence of soft disc herniation or compressive stenosis of the canal or foramina. Ordinary mild facet osteoarthritis and costovertebral osteoarthritis. No advanced finding. CT LUMBAR SPINE FINDINGS Segmentation: 5 lumbar type vertebral bodies. Alignment: Thoracolumbar curvature convex to the left and lower lumbar curvature convex to the right. Vertebrae: No fracture or primary bone lesion. Chronic discogenic endplate changes with sclerosis and cyst formation. Paraspinal and other soft tissues: Aortic atherosclerosis without evidence of aneurysm. Disc levels: L1-2: Disc degeneration with vacuum phenomenon. Mild bulging of the disc. No compressive stenosis. L2-3: Disc degeneration of vacuum phenomenon. Endplate osteophytes and mild bulging of the disc. Facet osteoarthritis. Right foraminal narrowing. L3-4: Disc degeneration with vacuum phenomenon. Endplate osteophytes and bulging of the disc. Facet osteoarthritis. No compressive stenosis. L4-5: Disc degeneration with loss of height and vacuum phenomenon. Small endplate osteophytes. Facet osteoarthritis. No compressive canal or foraminal narrowing. L5-S1: Disc degeneration with loss of disc height and vacuum phenomenon. Endplate osteophytes. Facet osteoarthritis. No apparent compressive stenosis. Bilateral sacroiliac osteoarthritis is present. IMPRESSION: Spinal curvature. Chronic degenerative disc disease throughout the thoracic and lumbar region, most pronounced in the lumbar region where there is advanced loss of disc height and vacuum phenomenon throughout. The findings in general could certainly relate to chronic back pain. There is no evidence of a compression fracture, either old or acute. The patient does not appear to have compressive stenosis of the thoracic or lumbar spinal canal. There is facet osteoarthritis at the region which also could contribute to back pain. There  is bilateral sacroiliac osteoarthritis which could be painful. Electronically Signed   By: Nelson Chimes M.D.   On: 12/11/2020 18:46   CT Lumbar Spine Wo Contrast  Result Date: 12/11/2020 CLINICAL DATA:  Mid back pain EXAM: CT THORACIC AND LUMBAR SPINE WITHOUT CONTRAST TECHNIQUE: Multidetector CT imaging of the thoracic and lumbar spine was performed without contrast. Multiplanar CT image reconstructions were also generated. COMPARISON:  Cervical CT 03/18/2018 FINDINGS: CT THORACIC SPINE FINDINGS Alignment: Mild cervicothoracic curvature convex to the right. Mild thoracic curvature convex to the left. Vertebrae: No evidence of fracture or focal bone lesion. Paraspinal and other soft tissues: Hiatal hernia. Disc levels: Degenerative disc disease throughout the thoracic region with disc space narrowing and vacuum phenomenon. Annular bulging to a minor degree from T6-7 through T11-12. No evidence of soft disc herniation or compressive stenosis of the canal or foramina. Ordinary mild facet osteoarthritis and costovertebral osteoarthritis. No advanced finding. CT LUMBAR SPINE FINDINGS Segmentation: 5 lumbar type vertebral bodies. Alignment: Thoracolumbar curvature convex to the left and lower lumbar curvature convex to the right. Vertebrae: No fracture or primary bone lesion. Chronic discogenic  endplate changes with sclerosis and cyst formation. Paraspinal and other soft tissues: Aortic atherosclerosis without evidence of aneurysm. Disc levels: L1-2: Disc degeneration with vacuum phenomenon. Mild bulging of the disc. No compressive stenosis. L2-3: Disc degeneration of vacuum phenomenon. Endplate osteophytes and mild bulging of the disc. Facet osteoarthritis. Right foraminal narrowing. L3-4: Disc degeneration with vacuum phenomenon. Endplate osteophytes and bulging of the disc. Facet osteoarthritis. No compressive stenosis. L4-5: Disc degeneration with loss of height and vacuum phenomenon. Small endplate osteophytes.  Facet osteoarthritis. No compressive canal or foraminal narrowing. L5-S1: Disc degeneration with loss of disc height and vacuum phenomenon. Endplate osteophytes. Facet osteoarthritis. No apparent compressive stenosis. Bilateral sacroiliac osteoarthritis is present. IMPRESSION: Spinal curvature. Chronic degenerative disc disease throughout the thoracic and lumbar region, most pronounced in the lumbar region where there is advanced loss of disc height and vacuum phenomenon throughout. The findings in general could certainly relate to chronic back pain. There is no evidence of a compression fracture, either old or acute. The patient does not appear to have compressive stenosis of the thoracic or lumbar spinal canal. There is facet osteoarthritis at the region which also could contribute to back pain. There is bilateral sacroiliac osteoarthritis which could be painful. Electronically Signed   By: Nelson Chimes M.D.   On: 12/11/2020 18:46    Assessment & Plan:   There are no diagnoses linked to this encounter. I am having [name] maintain her hydrocortisone, latanoprost, nystatin cream, lidocaine, polyethylene glycol powder, promethazine, ondansetron, gabapentin, traMADol-acetaminophen, potassium chloride SA, hydrochlorothiazide, metoprolol succinate, alum hydroxide-mag trisilicate, pantoprazole, FeroSul, gabapentin, and verapamil.  No orders of the defined types were placed in this encounter.    Follow-up: No follow-ups on file.  Scarlette Calico, MD

## 2022-08-10 DIAGNOSIS — S22000A Wedge compression fracture of unspecified thoracic vertebra, initial encounter for closed fracture: Secondary | ICD-10-CM | POA: Insufficient documentation

## 2022-08-11 ENCOUNTER — Telehealth: Payer: Self-pay

## 2022-08-11 NOTE — Telephone Encounter (Signed)
Pt niece is requesting a handicapp placard application be completed for the pt.  Pt niece is asking for cb at [phone]

## 2022-08-11 NOTE — Telephone Encounter (Signed)
Called patient in reference to handicap placard. Left message for patient to call office with information on the reason for this . Ex:  difficulty walking long distance, underline respiratory issues

## 2022-08-14 ENCOUNTER — Ambulatory Visit (INDEPENDENT_AMBULATORY_CARE_PROVIDER_SITE_OTHER): Payer: Medicare Other | Admitting: Podiatry

## 2022-08-14 ENCOUNTER — Encounter: Payer: Self-pay | Admitting: Podiatry

## 2022-08-14 ENCOUNTER — Other Ambulatory Visit: Payer: Self-pay | Admitting: Internal Medicine

## 2022-08-14 DIAGNOSIS — M79676 Pain in unspecified toe(s): Secondary | ICD-10-CM | POA: Diagnosis not present

## 2022-08-14 DIAGNOSIS — B351 Tinea unguium: Secondary | ICD-10-CM | POA: Diagnosis not present

## 2022-08-14 DIAGNOSIS — M79671 Pain in right foot: Secondary | ICD-10-CM | POA: Diagnosis not present

## 2022-08-14 DIAGNOSIS — M79672 Pain in left foot: Secondary | ICD-10-CM | POA: Diagnosis not present

## 2022-08-14 DIAGNOSIS — L84 Corns and callosities: Secondary | ICD-10-CM | POA: Diagnosis not present

## 2022-08-15 ENCOUNTER — Telehealth: Payer: Self-pay | Admitting: Internal Medicine

## 2022-08-15 NOTE — Telephone Encounter (Signed)
Pt dermatologist is closing and she would like a referral to a new dermatologist.   Please advise

## 2022-08-16 ENCOUNTER — Ambulatory Visit: Payer: Medicare Other | Admitting: Dermatology

## 2022-08-16 NOTE — Telephone Encounter (Signed)
The reasons for the placard is when she walks she looks down and walks real slow. Also she has severe Osteoporosis.

## 2022-08-16 NOTE — Telephone Encounter (Signed)
Placed Handicap placard in Dr. Ronnald Ramp office to be signed. Once signed patient will be contacted to pick up form

## 2022-08-16 NOTE — Telephone Encounter (Signed)
Called and left message that the handicap placard will be at the front desk for pick up

## 2022-08-21 NOTE — Progress Notes (Signed)
  Subjective:  Patient ID: [name], female    DOB: January 28, 1933,  MRN: [id number]  [name] presents to clinic today for corn(s) right lower extremity, callus(es) right lower extremity and painful mycotic nails.  Pain interferes with ambulation. Aggravating factors include wearing enclosed shoe gear. Painful toenails interfere with ambulation. Aggravating factors include wearing enclosed shoe gear. Pain is relieved with periodic professional debridement. Painful corns and calluses are aggravated when weightbearing with and without shoegear. Pain is relieved with periodic professional debridement.  Patient is accompanied by her niece, [name], on today's visit.  New problem(s): None.   PCP is [name] Lima, MD , and last visit was  August 07, 2022  No Known Allergies  Review of Systems: Negative except as noted in the HPI.  Objective: No changes noted in today's physical examination. [name] is a pleasant 88 y.o. y.o. female in NAD. AAO x 3. Vascular Examination: CFT <3 seconds b/l. DP/PT pulses faintly palpable b/l. Skin temperature gradient warm to warm b/l. No pain with calf compression. No ischemia or gangrene. No cyanosis or clubbing noted b/l. Trace edema noted BLE.   Neurological Examination: Sensation grossly intact b/l with 10 gram monofilament. Vibratory sensation intact b/l.   Dermatological Examination: Pedal skin thin, shiny and atrophic b/l LE. Toenails 1-5 b/l elongated, discolored, dystrophic, thickened, crumbly with subungual debris and tenderness to dorsal palpation. Hyperkeratotic lesion(s) plantar heel pad of right foot, R 3rd toe, submet head 5 right foot, and sub 5th met base right lower extremity.  No erythema, no edema, no drainage, no fluctuance.  Musculoskeletal Examination: Muscle strength 5/5 to b/l LE. HAV with bunion bilaterally and hammertoes 2-5 b/l. Plantar fat pad atrophy b/l lower extremities. Utilizes wheelchair for mobility  assistance.  Radiographs: None Assessment/Plan: 1. Pain due to onychomycosis of toenail   2. Corns and callosities   3. Pain in both feet     -Patient's family member present. All questions/concerns addressed on today's visit. -Examined patient. -Medicare ABN signed for services of paring of corn(s)/callus(es)/porokeratos(es). Copy in patient chart. -Medicaid ABN signed for services of paring of corn(s)/callus(es)/porokeratos(es) today. Copy in patient chart. -Mycotic toenails 1-5 bilaterally were debrided in length and girth with sterile nail nippers and dremel without incident. -Corn(s) R 3rd toe and callus(es) plantar heel pad of right foot, submet head 5 right foot, and sub 5th met base right lower extremity were pared utilizing sterile scalpel blade without incident. Total number debrided =4. -Patient/POA to call should there be question/concern in the interim.   Return in about 3 months (around 11/14/2022).  [name], DPM

## 2022-09-20 ENCOUNTER — Ambulatory Visit (INDEPENDENT_AMBULATORY_CARE_PROVIDER_SITE_OTHER): Payer: Medicare Other | Admitting: Family Medicine

## 2022-09-20 ENCOUNTER — Encounter: Payer: Self-pay | Admitting: Family Medicine

## 2022-09-20 ENCOUNTER — Encounter (HOSPITAL_COMMUNITY): Payer: Self-pay | Admitting: Radiology

## 2022-09-20 ENCOUNTER — Ambulatory Visit (HOSPITAL_COMMUNITY)
Admission: RE | Admit: 2022-09-20 | Discharge: 2022-09-20 | Disposition: A | Discharge status: Home / Self Care | Payer: Medicare Other | Source: Ambulatory Visit | Attending: Cardiology | Admitting: Cardiology

## 2022-09-20 VITALS — BP 130/82 | HR 85 | Temp 97.8°F | Ht <= 58 in

## 2022-09-20 DIAGNOSIS — M79661 Pain in right lower leg: Secondary | ICD-10-CM

## 2022-09-20 DIAGNOSIS — R238 Other skin changes: Secondary | ICD-10-CM | POA: Insufficient documentation

## 2022-09-20 DIAGNOSIS — I82411 Acute embolism and thrombosis of right femoral vein: Secondary | ICD-10-CM

## 2022-09-20 DIAGNOSIS — M7989 Other specified soft tissue disorders: Secondary | ICD-10-CM | POA: Diagnosis not present

## 2022-09-20 LAB — CBC WITH DIFFERENTIAL/PLATELET
Basophils Absolute: 0.1 10*3/uL (ref 0.0–0.1)
Basophils Relative: 1.1 % (ref 0.0–3.0)
Eosinophils Absolute: 0.3 10*3/uL (ref 0.0–0.7)
Eosinophils Relative: 4.9 % (ref 0.0–5.0)
HCT: 34.3 % — ABNORMAL LOW (ref 36.0–46.0)
Hemoglobin: 11.6 g/dL — ABNORMAL LOW (ref 12.0–15.0)
Lymphocytes Relative: 8.7 % — ABNORMAL LOW (ref 12.0–46.0)
Lymphs Abs: 0.5 10*3/uL — ABNORMAL LOW (ref 0.7–4.0)
MCHC: 33.9 g/dL (ref 30.0–36.0)
MCV: 85.7 fl (ref 78.0–100.0)
Monocytes Absolute: 0.5 10*3/uL (ref 0.1–1.0)
Monocytes Relative: 9.9 % (ref 3.0–12.0)
Neutro Abs: 4.1 10*3/uL (ref 1.4–7.7)
Neutrophils Relative %: 75.4 % (ref 43.0–77.0)
Platelets: 391 10*3/uL (ref 150.0–400.0)
RBC: 4 Mil/uL (ref 3.87–5.11)
RDW: 15.5 % (ref 11.5–15.5)
WBC: 5.5 10*3/uL (ref 4.0–10.5)

## 2022-09-20 LAB — BASIC METABOLIC PANEL
BUN: 19 mg/dL (ref 6–23)
CO2: 35 mEq/L — ABNORMAL HIGH (ref 19–32)
Calcium: 9.4 mg/dL (ref 8.4–10.5)
Chloride: 102 mEq/L (ref 96–112)
Creatinine, Ser: 0.82 mg/dL (ref 0.40–1.20)
GFR: 64.5 mL/min (ref >60.00)
Glucose, Bld: 103 mg/dL — ABNORMAL HIGH (ref 70–99)
Potassium: 4 mEq/L (ref 3.5–5.1)
Sodium: 142 mEq/L (ref 135–145)

## 2022-09-20 MED ORDER — RIVAROXABAN (XARELTO) VTE STARTER PACK (15 & 20 MG): ORAL_TABLET | ORAL | 0 refills | Status: DC

## 2022-09-20 MED ORDER — CEPHALEXIN 500 MG PO CAPS: 500.0000 mg | ORAL_CAPSULE | Freq: Two times a day (BID) | ORAL | 0 refills | Status: DC

## 2022-09-20 NOTE — Addendum Note (Signed)
Addended by: Girtha Rm on: 09/20/2022 03:23 PM   Modules accepted: Orders, Level of Service

## 2022-09-20 NOTE — Progress Notes (Signed)
Please call and schedule her to return for a follow up in 1-2 weeks.

## 2022-09-20 NOTE — Patient Instructions (Addendum)
Please go downstairs for labs before you leave today.   You will receive a call to go for an ultrasound of your right leg.   Take the antibiotic as prescribed.   Keep your leg elevated when sitting.   You may take Tylenol if needed for pain.

## 2022-09-20 NOTE — Progress Notes (Unsigned)
Patient was here at [place] today for RLE Venous duplex. She was found to be positive for DVT. Spoke with nurse Orvil Feil and was told to let patient know that the doctor will be prescribing Xarelto and for her to have her niece go over the instructions with her. She is also told to follow up with the office in one week, and to let them know ASAP if she begins experiencing SOB or chest pain.

## 2022-09-20 NOTE — Progress Notes (Signed)
No significant acute changes with her labs. Let's see what the ultrasound of her leg shows this afternoon. No change to treatment plan.

## 2022-09-20 NOTE — Progress Notes (Addendum)
Subjective:     Patient ID: [name], female    DOB: 07/15/1933, 88 y.o.   MRN: [id number]  Chief Complaint  Patient presents with   Leg Swelling    Right leg swelling for about 2 weeks, red and radiating heat    HPI Patient is in today for a 2 wk hx of RLE redness, swelling and tenderness.  No injury or open wound.   Denies fever, chills, chest pain, palpitations, shortness of breath, abdominal pain, N/V/D.   There are no preventive care reminders to display for this patient.   Past Medical History:  Diagnosis Date   Abnormal finding on Pap smear, ASCUS    Acute renal insufficiency    Amputation finger    index   Barrett's esophagus    CAD (coronary artery disease)    Dementia (HCC)    Diverticulosis    Elevated BP    Foot deformity    GERD (gastroesophageal reflux disease)    Glaucoma    H/O: GI bleed    HSV-2 (herpes simplex virus 2) infection    Hyperglycemia    Hypertension    Migraine    Osteoporosis    worsening   Spondylosis     Past Surgical History:  Procedure Laterality Date   AMPUTATION FINGER / THUMB     secondary to osteomyelitis   CATARACT EXTRACTION, BILATERAL Bilateral 12/25/2016   COLONOSCOPY     ESOPHAGOGASTRODUODENOSCOPY N/A 10/22/2016   Procedure: ESOPHAGOGASTRODUODENOSCOPY (EGD);  Surgeon: Doran Stabler, MD;  Location: [facility] ENDOSCOPY;  Service: Endoscopy;  Laterality: N/A;   FOOT SURGERY     right   PARTIAL HYSTERECTOMY     UPPER GASTROINTESTINAL ENDOSCOPY      Family History  Problem Relation Age of Onset   Breast cancer Mother    Heart attack Mother    Heart disease Father    Alcohol abuse Son    Colon cancer Neg Hx    Stomach cancer Neg Hx    Rectal cancer Neg Hx     Social History   Socioeconomic History   Marital status: Single    Spouse name: Not on file   Number of children: 1   Years of education: Not on file   Highest education level: 12th grade  Occupational History    Employer: RETIRED  Tobacco Use    Smoking status: Never   Smokeless tobacco: Never  Vaping Use   Vaping Use: Never used  Substance and Sexual Activity   Alcohol use: No    Alcohol/week: 0.0 standard drinks of alcohol   Drug use: No   Sexual activity: Never    Birth control/protection: Post-menopausal  Other Topics Concern   Not on file  Social History Narrative   Marital status: divorced.      Children: 1 son; no grandchildren      Lives: with son      Employment: retired      Tobacco: none      Alcohol: none      Drugs: none       Exercise: none       ADLs:  Independent with ADLs.  Quit driving age 78.  Medical CNA cleans house; makes breakfast; patient pays bills.      Advanced Directives:   YES.  [name].  FULL CODE; no prolonged measures.   Social Determinants of Health   Financial Resource Strain: Low Risk  (05/02/2022)   Overall Financial Resource Strain (CARDIA)  Difficulty of Paying Living Expenses: Not hard at all  Food Insecurity: No Food Insecurity (05/02/2022)   Hunger Vital Sign    Worried About Running Out of Food in the Last Year: Never true    Ran Out of Food in the Last Year: Never true  Transportation Needs: No Transportation Needs (05/02/2022)   PRAPARE - Hydrologist (Medical): No    Lack of Transportation (Non-Medical): No  Physical Activity: Inactive (05/02/2022)   Exercise Vital Sign    Days of Exercise per Week: 0 days    Minutes of Exercise per Session: 0 min  Stress: No Stress Concern Present (05/02/2022)   [place]    Feeling of Stress : Not at all  Social Connections: Unknown (05/02/2022)   Social Connection and Isolation Panel [NHANES]    Frequency of Communication with Friends and Family: Patient refused    Frequency of Social Gatherings with Friends and Family: Patient refused    Attends Religious Services: Patient refused    Active Member of Clubs or Organizations:  Patient refused    Attends Archivist Meetings: Patient refused    Marital Status: Patient refused  Intimate Partner Violence: Not At Risk (05/02/2022)   Humiliation, Afraid, Rape, and Kick questionnaire    Fear of Current or Ex-Partner: No    Emotionally Abused: No    Physically Abused: No    Sexually Abused: No    Outpatient Medications Prior to Visit  Medication Sig Dispense Refill   alum hydroxide-mag trisilicate (GAVISCON) 02-40 MG CHEW chewable tablet Chew 1 tablet by mouth 3 (three) times daily as needed for indigestion or heartburn. 270 tablet 1   FEROSUL 325 (65 Fe) MG tablet TAKE 1 TABLET(325 MG) BY MOUTH DAILY WITH BREAKFAST 90 tablet 1   gabapentin (NEURONTIN) 400 MG capsule Take 1 capsule (400 mg total) by mouth at bedtime. 90 capsule 1   hydrochlorothiazide (HYDRODIURIL) 25 MG tablet Take 1 tablet (25 mg total) by mouth daily. 90 tablet 3   latanoprost (XALATAN) 0.005 % ophthalmic solution INT 1 GTT IN OD QD     lidocaine (LIDODERM) 5 % Place 1 patch onto the skin daily as needed. Apply patch to area most significant pain once per day.  Remove and discard patch within 12 hours of application. 30 patch 1   metoprolol succinate (TOPROL-XL) 25 MG 24 hr tablet Take 1 tablet (25 mg total) by mouth daily. 90 tablet 3   nystatin cream (MYCOSTATIN) Apply to external affected area twice a day for 10 days as needed. 30 g 1   pantoprazole (PROTONIX) 40 MG tablet TAKE 1 TABLET BY MOUTH 1 TO 2 TIMES DAILY 90 tablet 1   polyethylene glycol powder (GLYCOLAX/MIRALAX) 17 GM/SCOOP powder Take 17 g by mouth daily. 3350 g 2   potassium chloride SA (KLOR-CON M) 20 MEQ tablet TAKE 1 TABLET(20 MEQ) BY MOUTH DAILY 30 tablet 3   traMADol-acetaminophen (ULTRACET) 37.5-325 MG tablet Take 1 tablet by mouth every 6 (six) hours as needed. 60 tablet 5   verapamil (CALAN-SR) 240 MG CR tablet Take 1 tablet (240 mg total) by mouth daily. 90 tablet 1   hydrocortisone 2.5 % ointment APPLY EXTERNALLY TO  THE AFFECTED AREA TWICE DAILY (Patient not taking: Reported on 09/20/2022) 28.35 g 0   promethazine (PHENERGAN) 12.5 MG tablet TAKE 1 TABLET BY MOUTH EVERY 6 HOURS AS NEEDED FOR NAUSEA OR VOMITING (Patient not taking: Reported on  09/20/2022) 30 tablet 6   gabapentin (NEURONTIN) 800 MG tablet TAKE 1 TABLET(800 MG) BY MOUTH AT BEDTIME (Patient not taking: Reported on 09/20/2022) 90 tablet 1   ondansetron (ZOFRAN ODT) 4 MG disintegrating tablet Take 1 tablet (4 mg total) by mouth every 8 (eight) hours as needed for nausea or vomiting. (Patient not taking: Reported on 09/20/2022) 30 tablet 2   No facility-administered medications prior to visit.    No Known Allergies  ROS     Objective:    Physical Exam Constitutional:      General: She is not in acute distress.    Appearance: She is not ill-appearing.  Cardiovascular:     Rate and Rhythm: Normal rate and regular rhythm.     Pulses:          Dorsalis pedis pulses are 1+ on the right side.  Pulmonary:     Effort: Pulmonary effort is normal.     Breath sounds: Normal breath sounds.  Musculoskeletal:     Right lower leg: Tenderness present. 2+ Pitting Edema present.     Left lower leg: Normal.     Right foot: Normal capillary refill. Swelling present.     Left foot: Normal.     Comments: Increased warmth and edema of RLE compared to left. Right lower leg with a shiny appearance, erythema from mid shin to ankle and entire right lower leg is TTP particularly in calf.   Feet:     Left foot:     Skin integrity: Erythema present.  Skin:    General: Skin is warm and dry.     Findings: Erythema present.  Neurological:     Mental Status: She is alert. Mental status is at baseline.     Comments: In wheelchair      BP 130/82 (BP Location: Left Arm, Patient Position: Sitting, Cuff Size: Normal)   Pulse 85   Temp 97.8 F (36.6 C) (Temporal)   Ht '4\' 10"'$  (1.473 m)   SpO2 93%   BMI 24.24 kg/m  Wt Readings from Last 3 Encounters:  04/24/22  116 lb (52.6 kg)  11/07/21 117 lb (53.1 kg)  07/13/21 116 lb 12.8 oz (53 kg)       Assessment & Plan:   Problem List Items Addressed This Visit   None Visit Diagnoses     Acute deep vein thrombosis (DVT) of femoral vein of right lower extremity (HCC)    -  Primary   Relevant Medications   RIVAROXABAN (XARELTO) VTE STARTER PACK (15 & 20 MG)   Pain and swelling of right lower leg       Relevant Orders   VAS [place] LOWER EXTREMITY VENOUS (DVT) (Completed)   CBC with Differential/Platelet (Completed)   Basic metabolic panel (Completed)   Redness and swelling of lower leg       Relevant Medications   cephALEXin (KEFLEX) 500 MG capsule   Other Relevant Orders   VAS [place] LOWER EXTREMITY VENOUS (DVT) (Completed)   CBC with Differential/Platelet (Completed)   Basic metabolic panel (Completed)      She is not in acute distress.  Stat CBC, BMP ordered.  Stat VAS [place] of RLE ordered to rule out DVT. Scheduled for 1 pm today. Caregiver is with her and aware.  Keflex prescribed due to possible early cellulitis.  Dr. Ronnald Ramp also examined patient and agrees with plan of care.   Addendum:  Call from radiology  Acute DVT- confirmed that patient's niece will be able to  assist patient with taking anticoagulant. Xarelto starter dose sent to pharmacy. Advised to follow up in 1-2 weeks or sooner if any concerns. She will call 911 if she develops chest pain or shortness of breath.   I have discontinued Kla Foti's ondansetron. I am also having her start on cephALEXin and Rivaroxaban Stater Pack (15 mg and 20 mg). Additionally, I am having her maintain her hydrocortisone, latanoprost, nystatin cream, lidocaine, polyethylene glycol powder, promethazine, gabapentin, traMADol-acetaminophen, hydrochlorothiazide, metoprolol succinate, alum hydroxide-mag trisilicate, pantoprazole, FeroSul, verapamil, and potassium chloride SA.  Meds ordered this encounter  Medications   cephALEXin (KEFLEX) 500 MG capsule     Sig: Take 1 capsule (500 mg total) by mouth 2 (two) times daily.    Dispense:  20 capsule    Refill:  0    Order Specific Question:   Supervising Provider    Answer:   Pricilla Holm A [[id number]]   RIVAROXABAN (XARELTO) VTE STARTER PACK (15 & 20 MG)    Sig: Follow package directions: Take one '15mg'$  tablet by mouth twice a day. On day 22, switch to one '20mg'$  tablet once a day. Take with food.    Dispense:  51 each    Refill:  0    Order Specific Question:   Supervising Provider    Answer:   Pricilla Holm A [[id number]]

## 2022-09-21 ENCOUNTER — Telehealth: Payer: Self-pay

## 2022-09-21 NOTE — Telephone Encounter (Signed)
Patient is calling in asking if someone is able to give her a call back for clarification on how to take RIVAROXABAN (XARELTO) VTE STARTER PACK (15 & 20 MG).

## 2022-09-22 NOTE — Telephone Encounter (Signed)
Called pt, LVM.   

## 2022-09-26 ENCOUNTER — Ambulatory Visit (INDEPENDENT_AMBULATORY_CARE_PROVIDER_SITE_OTHER): Payer: Medicare Other | Admitting: Internal Medicine

## 2022-09-26 ENCOUNTER — Encounter: Payer: Self-pay | Admitting: Internal Medicine

## 2022-09-26 VITALS — BP 112/62 | HR 83 | Temp 98.8°F | Ht <= 58 in

## 2022-09-26 DIAGNOSIS — Z23 Encounter for immunization: Secondary | ICD-10-CM

## 2022-09-26 DIAGNOSIS — H6123 Impacted cerumen, bilateral: Secondary | ICD-10-CM | POA: Diagnosis not present

## 2022-09-26 DIAGNOSIS — I82411 Acute embolism and thrombosis of right femoral vein: Secondary | ICD-10-CM | POA: Diagnosis not present

## 2022-09-26 MED ORDER — RIVAROXABAN 20 MG PO TABS: 20.0000 mg | ORAL_TABLET | Freq: Every day | ORAL | 1 refills | Status: DC

## 2022-09-26 NOTE — Patient Instructions (Signed)
Deep Vein Thrombosis  Deep vein thrombosis (DVT) is a condition in which a blood clot forms in a vein of the deep venous system. This can occur in the lower leg, thigh, pelvis, arm, or neck. A clot is blood that has thickened into a gel or solid. This condition is serious and can be life-threatening if the clot travels to the arteries of the lungs and causes a blockage (pulmonary embolism). A DVT can also damage veins in the leg, which can lead to long-term venous disease, leg pain, swelling, discoloration, and ulcers or sores (post-thrombotic syndrome). What are the causes? This condition may be caused by: A slowdown of blood flow. Damage to a vein. A condition that causes blood to clot more easily, such as certain bleeding disorders. What increases the risk? The following factors may make you more likely to develop this condition: Obesity. Being older, especially older than age 3. Being inactive or not moving around (sedentary lifestyle). This may include: Sitting or lying down for longer than 4-6 hours other than to sleep at night. Being in the hospital, or having major or lengthy surgery. Having any recent bone injuries, such as breaks (fractures), that reduce movement, especially in the lower extremities. Having recent orthopedic surgery on the lower extremities. Being pregnant, giving birth, or having recently given birth. Taking medicines that contain estrogen, such as birth control or hormone replacement therapy. Using products that contain nicotine or tobacco, especially if you use hormonal birth control. Having a history of a blood vessel disease (peripheral vascular disease) or congestive heart disease. Having a history of cancer, especially if being treated with chemotherapy. What are the signs or symptoms? Symptoms of this condition include: Swelling, pain, pressure, or tenderness in an arm or a leg. An arm or a leg becoming warm, red, or discolored. A leg turning very pale or  blue. You may have a large DVT. This is rare. If the clot is in your leg, you may notice that symptoms get worse when you stand or walk. In some cases, there are no symptoms. How is this diagnosed? This condition is diagnosed with: Your medical history and a physical exam. Tests, such as: Blood tests to check how well your blood clots. Doppler ultrasound. This is the best way to find a DVT. CT venogram. Contrast dye is injected into a vein, and X-rays are taken to check for clots. This is helpful for veins in the chest or pelvis. How is this treated? Treatment for this condition depends on: The cause of your DVT. The size and location of your DVT, or having more than one DVT. Your risk for bleeding or developing more clots. Other medical conditions you may have. Treatment may include: Taking a blood thinner medicine (anticoagulant) to prevent more clots from forming or current clots from growing. Wearing compression stockings. Injecting medicines into the affected vein to break up the clot (catheter-directed thrombolysis). Surgical procedures, when DVT is severe or hard to treat. These may be done to: Isolate and remove your clot. Place an inferior vena cava (IVC) filter. This filter is placed into a large vein called the inferior vena cava to catch blood clots before they reach your lungs. You may get some medical treatments for 6 months or longer. Follow these instructions at home: If you are taking blood thinners: Talk with your health care provider before you take any medicines that contain aspirin or NSAIDs, such as ibuprofen. These medicines increase your risk for dangerous bleeding. Take your medicine exactly  as told, at the same time every day. Do not skip a dose. Do not take more than the prescribed dose. This is important. Ask your health care provider about foods and medicines that could change or interact with the way your blood thinner works. Avoid these foods and medicines  if you are told to do so. Avoid anything that may cause bleeding or bruising. You may bleed more easily while taking blood thinners. Be very careful when using knives, scissors, or other sharp objects. Use an electric razor instead of a blade. Avoid activities that could cause injury or bruising, and follow instructions for preventing falls. Tell your health care provider if you have had any internal bleeding, bleeding ulcers, or neurologic diseases, such as strokes or cerebral aneurysms. Wear a medical alert bracelet or carry a card that lists what medicines you take. General instructions Take over-the-counter and prescription medicines only as told by your health care provider. Return to your normal activities as told by your health care provider. Ask your health care provider what activities are safe for you. If recommended, wear compression stockings as told by your health care provider. These stockings help to prevent blood clots and reduce swelling in your legs. Never wear your compression stockings while sleeping at night. Keep all follow-up visits. This is important. Where to find more information American Heart Association: www.heart.org Centers for Disease Control and Prevention: http://www.wolf.info/ National Heart, Lung, and Blood Institute: https://wilson-eaton.com/ Contact a health care provider if: You miss a dose of your blood thinner. You have unusual bruising or other color changes. You have new or worse pain, swelling, or redness in an arm or a leg. You have worsening numbness or tingling in an arm or a leg. You have a significant color change (pale or blue) in the extremity that has the DVT. Get help right away if: You have signs or symptoms that a blood clot has moved to the lungs. These may include: Shortness of breath. Chest pain. Fast or irregular heartbeats (palpitations). Light-headedness, dizziness, or fainting. Coughing up blood. You have signs or symptoms that your blood is  too thin. These may include: Blood in your vomit, stool, or urine. A cut that will not stop bleeding. A menstrual period that is heavier than usual. A severe headache or confusion. These symptoms may be an emergency. Get help right away. Call 911. Do not wait to see if the symptoms will go away. Do not drive yourself to the hospital. Summary Deep vein thrombosis (DVT) happens when a blood clot forms in a deep vein. This may occur in the lower leg, thigh, pelvis, arm, or neck. Symptoms affect the arm or leg and can include swelling, pain, tenderness, warmth, redness, or discoloration. This condition may be treated with medicines. In severe cases, a procedure or surgery may be done to remove or dissolve the clots. If you are taking blood thinners, take them exactly as told. Do not skip a dose. Do not take more than is prescribed. Get help right away if you have a severe headache, shortness of breath, chest pain, fast or irregular heartbeats, or blood in your vomit, urine, or stool. This information is not intended to replace advice given to you by your health care provider. Make sure you discuss any questions you have with your health care provider. Document Revised: 07/04/2021 Document Reviewed: 07/04/2021 Elsevier Patient Education  [place].

## 2022-09-26 NOTE — Progress Notes (Signed)
Subjective:  Patient ID: [name], female    DOB: [birth year]  Age: 88 y.o. MRN: [id number]  CC: Follow-up   HPI [name] presents for f/up -  The family tells me that she is taking the Xarelto starter pack.  They have noticed some blood in her left eye but it does not seem to bother her.  She denies nosebleeds, bruising, or hematuria.  She still has the swelling in her right lower extremity but its not as painful.  Outpatient Medications Prior to Visit  Medication Sig Dispense Refill   alum hydroxide-mag trisilicate (GAVISCON) 52-77 MG CHEW chewable tablet Chew 1 tablet by mouth 3 (three) times daily as needed for indigestion or heartburn. 270 tablet 1   cephALEXin (KEFLEX) 500 MG capsule Take 1 capsule (500 mg total) by mouth 2 (two) times daily. 20 capsule 0   FEROSUL 325 (65 Fe) MG tablet TAKE 1 TABLET(325 MG) BY MOUTH DAILY WITH BREAKFAST 90 tablet 1   gabapentin (NEURONTIN) 400 MG capsule Take 1 capsule (400 mg total) by mouth at bedtime. 90 capsule 1   hydrochlorothiazide (HYDRODIURIL) 25 MG tablet Take 1 tablet (25 mg total) by mouth daily. 90 tablet 3   hydrocortisone 2.5 % ointment APPLY EXTERNALLY TO THE AFFECTED AREA TWICE DAILY 28.35 g 0   latanoprost (XALATAN) 0.005 % ophthalmic solution INT 1 GTT IN OD QD     lidocaine (LIDODERM) 5 % Place 1 patch onto the skin daily as needed. Apply patch to area most significant pain once per day.  Remove and discard patch within 12 hours of application. 30 patch 1   metoprolol succinate (TOPROL-XL) 25 MG 24 hr tablet Take 1 tablet (25 mg total) by mouth daily. 90 tablet 3   nystatin cream (MYCOSTATIN) Apply to external affected area twice a day for 10 days as needed. 30 g 1   pantoprazole (PROTONIX) 40 MG tablet TAKE 1 TABLET BY MOUTH 1 TO 2 TIMES DAILY 90 tablet 1   polyethylene glycol powder (GLYCOLAX/MIRALAX) 17 GM/SCOOP powder Take 17 g by mouth daily. 3350 g 2   potassium chloride SA (KLOR-CON M) 20 MEQ tablet TAKE 1 TABLET(20  MEQ) BY MOUTH DAILY 30 tablet 3   promethazine (PHENERGAN) 12.5 MG tablet TAKE 1 TABLET BY MOUTH EVERY 6 HOURS AS NEEDED FOR NAUSEA OR VOMITING 30 tablet 6   RIVAROXABAN (XARELTO) VTE STARTER PACK (15 & 20 MG) Follow package directions: Take one 73m tablet by mouth twice a day. On day 22, switch to one 218mtablet once a day. Take with food. 51 each 0   traMADol-acetaminophen (ULTRACET) 37.5-325 MG tablet Take 1 tablet by mouth every 6 (six) hours as needed. 60 tablet 5   verapamil (CALAN-SR) 240 MG CR tablet Take 1 tablet (240 mg total) by mouth daily. 90 tablet 1   No facility-administered medications prior to visit.    ROS Review of Systems  Constitutional: Negative.  Negative for diaphoresis and fatigue.  HENT:  Positive for hearing loss.   Eyes: Negative.   Respiratory:  Negative for chest tightness, shortness of breath and wheezing.   Cardiovascular:  Positive for leg swelling. Negative for chest pain and palpitations.  Gastrointestinal:  Negative for abdominal pain, nausea and vomiting.  Endocrine: Negative.   Genitourinary: Negative.  Negative for difficulty urinating and hematuria.  Musculoskeletal:  Positive for arthralgias. Negative for back pain.  Skin: Negative.   Neurological:  Negative for dizziness and weakness.  Hematological:  Negative for adenopathy. Does not  bruise/bleed easily.  Psychiatric/Behavioral: Negative.      Objective:  BP 112/62 (BP Location: Left Arm, Patient Position: Sitting, Cuff Size: Normal)   Pulse 83   Temp 98.8 F (37.1 C) (Oral)   Ht 4' 10"  (1.473 m)   SpO2 93%   BMI 24.24 kg/m   BP Readings from Last 3 Encounters:  09/26/22 112/62  09/20/22 130/82  08/07/22 128/66    Wt Readings from Last 3 Encounters:  04/24/22 116 lb (52.6 kg)  11/07/21 117 lb (53.1 kg)  07/13/21 116 lb 12.8 oz (53 kg)    Physical Exam Vitals reviewed.  Constitutional:      General: She is not in acute distress.    Appearance: She is ill-appearing. She  is not toxic-appearing or diaphoretic.  HENT:     Right Ear: Decreased hearing noted. There is impacted cerumen.     Left Ear: Decreased hearing noted. There is impacted cerumen.     Nose: Nose normal.     Mouth/Throat:     Mouth: Mucous membranes are moist.  Eyes:     General: No scleral icterus.    Conjunctiva/sclera:     Right eye: Right conjunctiva is not injected. No chemosis, exudate or hemorrhage.    Left eye: Left conjunctiva is not injected. Hemorrhage present. No chemosis or exudate.  Cardiovascular:     Rate and Rhythm: Normal rate and regular rhythm.     Pulses:          Dorsalis pedis pulses are 1+ on the right side and 1+ on the left side.       Posterior tibial pulses are 1+ on the right side and 1+ on the left side.     Heart sounds: No murmur heard. Pulmonary:     Effort: Pulmonary effort is normal.     Breath sounds: No stridor. No wheezing, rhonchi or rales.  Abdominal:     General: Abdomen is flat.     Palpations: There is no mass.     Tenderness: There is no abdominal tenderness. There is no guarding.     Hernia: No hernia is present.  Musculoskeletal:        General: Normal range of motion.     Cervical back: Neck supple.     Right lower leg: 1+ Edema present.     Left lower leg: No edema.  Lymphadenopathy:     Cervical: No cervical adenopathy.  Skin:    General: Skin is warm and dry.  Neurological:     General: No focal deficit present.     Mental Status: She is alert.  Psychiatric:        Mood and Affect: Mood normal.        Behavior: Behavior normal.     Lab Results  Component Value Date   WBC 5.5 09/20/2022   HGB 11.6 (L) 09/20/2022   HCT 34.3 (L) 09/20/2022   PLT 391.0 09/20/2022   GLUCOSE 103 (H) 09/20/2022   CHOL 175 11/03/2019   TRIG 79 11/03/2019   HDL 82 11/03/2019   LDLCALC 78 11/03/2019   ALT 10 04/14/2022   AST 16 04/14/2022   NA 142 09/20/2022   K 4.0 09/20/2022   CL 102 09/20/2022   CREATININE 0.82 09/20/2022   BUN 19  09/20/2022   CO2 35 (H) 09/20/2022   TSH 1.86 05/05/2021   HGBA1C 5.4 05/30/2017    VAS [place] LOWER EXTREMITY VENOUS (DVT)  Result Date: 09/21/2022  Lower Venous DVT Study  Patient Name:  [name]  Date of Exam:   09/20/2022 Medical Rec #: [id number]    Accession #:    [id number] Date of Birth: 02/17/1933    Patient Gender: F Patient Age:   73 years Exam Location:  [facility] Procedure:      VAS [place] LOWER EXTREMITY VENOUS (DVT) Referring Phys: Loletha Carrow HENSON --------------------------------------------------------------------------------  Indications: Increase bilateral lower legs x 3 months. Worsening right lower extremity and redness x 2 weeks. Report is given by niece. Patient denies any abnormal SOB.  Risk Factors: Trauma recent fall in her bathroom in 07/2022. Comparison Study: NA Performing Technologist: Sharlett Iles RVT  Examination Guidelines: A complete evaluation includes B-mode imaging, spectral Doppler, color Doppler, and power Doppler as needed of all accessible portions of each vessel. Bilateral testing is considered an integral part of a complete examination. Limited examinations for reoccurring indications may be performed as noted. The reflux portion of the exam is performed with the patient in reverse Trendelenburg.  +---------+---------------+---------+-----------+---------------+--------------+ RIGHT    CompressibilityPhasicitySpontaneityProperties     Thrombus Aging +---------+---------------+---------+-----------+---------------+--------------+ CFV      Full           No       Yes                                      +---------+---------------+---------+-----------+---------------+--------------+ SFJ      Full           No       Yes                                      +---------+---------------+---------+-----------+---------------+--------------+ FV Prox  Full           No       Yes                                       +---------+---------------+---------+-----------+---------------+--------------+ FV Mid   Full           No       Yes                                      +---------+---------------+---------+-----------+---------------+--------------+ FV DistalNone           No       No         softly         Acute                                                      echogenic                     +---------+---------------+---------+-----------+---------------+--------------+ PFV      Full                    Yes                                      +---------+---------------+---------+-----------+---------------+--------------+  POP      None           No       No         softly         Acute                                                      echogenic                     +---------+---------------+---------+-----------+---------------+--------------+ PTV      None           No       No         softly         Acute                                                      echogenic                     +---------+---------------+---------+-----------+---------------+--------------+ PERO     None           No       No         softly         Acute                                                      echogenic                     +---------+---------------+---------+-----------+---------------+--------------+ Gastroc  None           No       No         softly         Acute                                                      echogenic                     +---------+---------------+---------+-----------+---------------+--------------+ GSV      Full           Yes                                               +---------+---------------+---------+-----------+---------------+--------------+ TPT      None           No       No         softly         Acute  echogenic and                                                              dilated at 1.1                                                            cm AP x 1.3 cm                                                            TRV                           +---------+---------------+---------+-----------+---------------+--------------+   Right Technical Findings: Acute occlusive thrombus in the distal femoral, popliteal, gastrocnemius, one of the paired posterior tibial, one of the paired peroneal veins and TPT. Pulsatile venous flow noted in the CFV, SFJ, proximal and mid femoral vein.  +----+---------------+---------+-----------+----------+--------------+ LEFTCompressibilityPhasicitySpontaneityPropertiesThrombus Aging +----+---------------+---------+-----------+----------+--------------+ CFV Full                    Yes                                 +----+---------------+---------+-----------+----------+--------------+   Left Technical Findings: Pulsatile venous flow noted in the CFV.  Findings reported to [facility] at 3:00 pm.  Summary: RIGHT: - Findings consistent with acute deep vein thrombosis involving the right femoral vein, right popliteal vein, right posterior tibial veins, right peroneal veins, right gastrocnemius veins, and tibioperoneal trunk. - No cystic structure found in the popliteal fossa. - Pulsatile venous flow noted in the CFV, SFJ, proximal and mid femoral vein. - All other veins visualized appear fully compressible and demonstrate appropriate Doppler characteristics.  LEFT: - No evidence of common femoral vein obstruction. - Pulsatile venous flow noted in the CFV. -Pulsatile lower limb venous Doppler waveform correlates well with increase right atrium pressure; right-sided heart failure.  *See table(s) above for measurements and observations. Electronically signed by Ida Rogue MD on 09/21/2022 at 2:39:48 PM.    Final     Assessment & Plan:   [name] was seen today for follow-up.  Diagnoses  and all orders for this visit:  Acute deep vein thrombosis (DVT) of femoral vein of right lower extremity ([place])- I instructed the family that she should complete the Xarelto starter kit and should complete a 15-monthcourse of a daily DOAC.  They will let me know if she develops any new or worsening symptoms. -     rivaroxaban (XARELTO) 20 MG TABS tablet; Take 1 tablet (20 mg total) by mouth daily with supper.  Flu vaccine need -     Flu Vaccine QUAD High Dose(Fluad)  Hearing loss of both ears due to cerumen impaction -     Ambulatory referral to ENT   I am having BHubert Azurestart  on rivaroxaban. I am also having her maintain her hydrocortisone, latanoprost, nystatin cream, lidocaine, polyethylene glycol powder, promethazine, gabapentin, traMADol-acetaminophen, hydrochlorothiazide, metoprolol succinate, alum hydroxide-mag trisilicate, pantoprazole, FeroSul, verapamil, potassium chloride SA, cephALEXin, and Rivaroxaban Stater Pack (15 mg and 20 mg).  Meds ordered this encounter  Medications   rivaroxaban (XARELTO) 20 MG TABS tablet    Sig: Take 1 tablet (20 mg total) by mouth daily with supper.    Dispense:  90 tablet    Refill:  1     Follow-up: No follow-ups on file.  Scarlette Calico, MD

## 2022-09-27 DIAGNOSIS — H401131 Primary open-angle glaucoma, bilateral, mild stage: Secondary | ICD-10-CM | POA: Diagnosis not present

## 2022-09-27 DIAGNOSIS — H04123 Dry eye syndrome of bilateral lacrimal glands: Secondary | ICD-10-CM | POA: Diagnosis not present

## 2022-09-27 DIAGNOSIS — H353131 Nonexudative age-related macular degeneration, bilateral, early dry stage: Secondary | ICD-10-CM | POA: Diagnosis not present

## 2022-09-27 DIAGNOSIS — Z961 Presence of intraocular lens: Secondary | ICD-10-CM | POA: Diagnosis not present

## 2022-09-27 DIAGNOSIS — H1045 Other chronic allergic conjunctivitis: Secondary | ICD-10-CM | POA: Diagnosis not present

## 2022-09-29 ENCOUNTER — Telehealth: Payer: Self-pay

## 2022-09-29 NOTE — Telephone Encounter (Signed)
Patient is calling in asking for referral to an Audiologist as she has been having hearing issues. States she has discussed this with Dr.Jones before.

## 2022-10-06 ENCOUNTER — Other Ambulatory Visit: Payer: Self-pay | Admitting: Internal Medicine

## 2022-10-06 DIAGNOSIS — M479 Spondylosis, unspecified: Secondary | ICD-10-CM

## 2022-10-06 DIAGNOSIS — M5136 Other intervertebral disc degeneration, lumbar region: Secondary | ICD-10-CM

## 2022-10-16 ENCOUNTER — Telehealth: Payer: Self-pay | Admitting: Internal Medicine

## 2022-10-16 NOTE — Telephone Encounter (Signed)
Please advise 

## 2022-10-16 NOTE — Telephone Encounter (Signed)
Patient was prescribed Xarelto - patient's family wants to know if this could be causing her hearing loss.   Patient has an appointment with an ENT in December - Patient's family wants to know if you can get her an appointment earlier than December - or if you think the Xarelto is causing this hearing loss.  Please Advise.

## 2022-10-18 NOTE — Telephone Encounter (Signed)
Malachy Mood has been informed that Xarelto does not cause hearing loss. She expressed understanding.

## 2022-10-18 NOTE — Telephone Encounter (Signed)
[name] is calling in asking for an update. Please advise.

## 2022-11-13 ENCOUNTER — Telehealth: Payer: Self-pay | Admitting: Internal Medicine

## 2022-11-13 DIAGNOSIS — Z23 Encounter for immunization: Secondary | ICD-10-CM | POA: Diagnosis not present

## 2022-11-13 NOTE — Telephone Encounter (Signed)
Pt has been informed that it is ok to proceed with taking Rx's as normal prior to vaccine.

## 2022-11-13 NOTE — Telephone Encounter (Signed)
Pt called saying she has an appointment today at [facility] for RSV vaccine. Can she take her normal medications before going? Pt says she takes a pain pill every morning and acid reflux medication and she "doesn't want to die." Please advise.

## 2022-11-29 DIAGNOSIS — H903 Sensorineural hearing loss, bilateral: Secondary | ICD-10-CM | POA: Diagnosis not present

## 2022-11-29 DIAGNOSIS — H6123 Impacted cerumen, bilateral: Secondary | ICD-10-CM | POA: Diagnosis not present

## 2022-11-29 DIAGNOSIS — H838X3 Other specified diseases of inner ear, bilateral: Secondary | ICD-10-CM | POA: Diagnosis not present

## 2022-12-04 ENCOUNTER — Ambulatory Visit (INDEPENDENT_AMBULATORY_CARE_PROVIDER_SITE_OTHER): Payer: Medicare Other | Admitting: Podiatry

## 2022-12-04 ENCOUNTER — Encounter: Payer: Self-pay | Admitting: Podiatry

## 2022-12-04 ENCOUNTER — Ambulatory Visit: Payer: Medicare Other | Admitting: Podiatry

## 2022-12-04 VITALS — BP 134/74

## 2022-12-04 DIAGNOSIS — B351 Tinea unguium: Secondary | ICD-10-CM | POA: Diagnosis not present

## 2022-12-04 DIAGNOSIS — Q828 Other specified congenital malformations of skin: Secondary | ICD-10-CM | POA: Diagnosis not present

## 2022-12-04 DIAGNOSIS — M79676 Pain in unspecified toe(s): Secondary | ICD-10-CM | POA: Diagnosis not present

## 2022-12-04 DIAGNOSIS — M79671 Pain in right foot: Secondary | ICD-10-CM

## 2022-12-04 DIAGNOSIS — D689 Coagulation defect, unspecified: Secondary | ICD-10-CM | POA: Diagnosis not present

## 2022-12-04 DIAGNOSIS — L84 Corns and callosities: Secondary | ICD-10-CM

## 2022-12-04 NOTE — Progress Notes (Signed)
  Subjective:  Patient ID: [name], female    DOB: 1933-09-22,  MRN: [id number]  Joniyah Mallinger presents to clinic today for painful porokeratotic lesion(s) right lower extremity and painful mycotic toenails that limit ambulation. Painful toenails interfere with ambulation. Aggravating factors include wearing enclosed shoe gear. Pain is relieved with periodic professional debridement. Painful porokeratotic lesions are aggravated when weightbearing with and without shoegear. Pain is relieved with periodic professional debridement.   She is accompanied by her niece on today's visit.  Chief Complaint  Patient presents with   Nail Problem    [facility] PCP-Thomas Jones PCP VST-   New problem(s): Patient was diagnosed with DVT of right femoral vein and is now on Xarelto.  PCP is Janith Lima, MD.  No Known Allergies  Review of Systems: Negative except as noted in the HPI.  Objective: No changes noted in today's physical examination. Vitals:   12/04/22 1044  BP: 134/74   Keely Drennan is a pleasant 88 y.o. female frail, in NAD. AAO x 3. Vascular Examination: CFT <3 seconds b/l. DP/PT pulses faintly palpable b/l. Skin temperature gradient warm to warm b/l. No pain with calf compression. No ischemia or gangrene. No cyanosis or clubbing noted b/l. Trace edema noted BLE.   Neurological Examination: Sensation grossly intact b/l with 10 gram monofilament. Vibratory sensation intact b/l.   Dermatological Examination: Pedal skin thin, shiny and atrophic b/l LE. Toenails 1-5 b/l elongated, discolored, dystrophic, thickened, crumbly with subungual debris and tenderness to dorsal palpation.   Hyperkeratotic lesion(s) plantar heel pad of right foot, R 2nd toe, submet head 5 right foot, and sub 5th met base right lower extremity.  No erythema, no edema, no drainage, no fluctuance.  Musculoskeletal Examination: Muscle strength 5/5 to b/l LE. HAV with bunion bilaterally and hammertoes 2-5 b/l. Plantar  fat pad atrophy b/l lower extremities. Utilizes wheelchair for mobility assistance.  Radiographs: None  Assessment/Plan: 1. Pain due to onychomycosis of toenail   2. Porokeratosis   3. Clotting disorder ([place])     No orders of the defined types were placed in this encounter.   -Patient's family member present. All questions/concerns addressed on today's visit. -Examined patient. -Toenails 1-5 b/l were debrided in length and girth with sterile nail nippers and dremel without iatrogenic bleeding.  -Porokeratotic lesion(s) plantar heel pad of right foot, R 2nd toe, and sub 5th met base right lower extremity pared and enucleated with sterile currette without incident. Total number of lesions debrided=3. -Patient/POA to call should there be question/concern in the interim.   Return in about 3 months (around 03/05/2023).  Marzetta Board, DPM

## 2022-12-06 ENCOUNTER — Other Ambulatory Visit: Payer: Self-pay | Admitting: Internal Medicine

## 2022-12-06 DIAGNOSIS — K219 Gastro-esophageal reflux disease without esophagitis: Secondary | ICD-10-CM

## 2022-12-09 DIAGNOSIS — I82411 Acute embolism and thrombosis of right femoral vein: Secondary | ICD-10-CM | POA: Insufficient documentation

## 2022-12-19 ENCOUNTER — Other Ambulatory Visit: Payer: Self-pay | Admitting: Internal Medicine

## 2023-01-20 ENCOUNTER — Other Ambulatory Visit: Payer: Self-pay | Admitting: Internal Medicine

## 2023-01-21 ENCOUNTER — Other Ambulatory Visit: Payer: Self-pay | Admitting: Internal Medicine

## 2023-01-25 NOTE — Progress Notes (Signed)
This encounter was created in error - please disregard.

## 2023-02-07 ENCOUNTER — Ambulatory Visit (INDEPENDENT_AMBULATORY_CARE_PROVIDER_SITE_OTHER): Payer: Medicare Other | Admitting: Podiatry

## 2023-02-07 DIAGNOSIS — M216X2 Other acquired deformities of left foot: Secondary | ICD-10-CM | POA: Diagnosis not present

## 2023-02-07 DIAGNOSIS — M25872 Other specified joint disorders, left ankle and foot: Secondary | ICD-10-CM

## 2023-02-07 NOTE — Progress Notes (Signed)
Subjective:  Patient ID: [name], female    DOB: Apr 09, 1933,  MRN: [id number]  Chief Complaint  Patient presents with   Toe Pain    Right foot sore on 3rd toe     89 y.o. female presents with the above complaint.  Patient presents with right second [name] digit predislocation syndrome with severe bunion deformity and severe hammertoe contracture of the second into dorsal superficial breakdown of the skin.  She says been present for quite some time is progressive gotten worse she wanted to discuss treatment options for this.  She has not seen anyone as prior to seeing me for this.   Review of Systems: Negative except as noted in the HPI. Denies N/V/F/Ch.  Past Medical History:  Diagnosis Date   Abnormal finding on Pap smear, ASCUS    Acute renal insufficiency    Amputation finger    index   Barrett's esophagus    CAD (coronary artery disease)    Dementia (HCC)    Diverticulosis    Elevated BP    Foot deformity    GERD (gastroesophageal reflux disease)    Glaucoma    H/O: GI bleed    HSV-2 (herpes simplex virus 2) infection    Hyperglycemia    Hypertension    Migraine    Osteoporosis    worsening   Spondylosis     Current Outpatient Medications:    alum hydroxide-mag trisilicate (GAVISCON) [id number] MG CHEW chewable tablet, Chew 1 tablet by mouth 3 (three) times daily as needed for indigestion or heartburn., Disp: 270 tablet, Rfl: 1   cephALEXin (KEFLEX) 500 MG capsule, Take 1 capsule (500 mg total) by mouth 2 (two) times daily., Disp: 20 capsule, Rfl: 0   FEROSUL 325 (65 Fe) MG tablet, TAKE 1 TABLET(325 MG) BY MOUTH DAILY WITH BREAKFAST, Disp: 90 tablet, Rfl: 1   gabapentin (NEURONTIN) 400 MG capsule, Take 1 capsule (400 mg total) by mouth at bedtime., Disp: 90 capsule, Rfl: 1   hydrochlorothiazide (HYDRODIURIL) 25 MG tablet, Take 1 tablet (25 mg total) by mouth daily., Disp: 90 tablet, Rfl: 3   hydrocortisone 2.5 % ointment, APPLY EXTERNALLY TO THE AFFECTED AREA TWICE  DAILY, Disp: 28.35 g, Rfl: 0   latanoprost (XALATAN) 0.005 % ophthalmic solution, INT 1 GTT IN OD QD, Disp: , Rfl:    lidocaine (LIDODERM) 5 %, Place 1 patch onto the skin daily as needed. Apply patch to area most significant pain once per day.  Remove and discard patch within 12 hours of application., Disp: 30 patch, Rfl: 1   metoprolol succinate (TOPROL-XL) 25 MG 24 hr tablet, Take 1 tablet (25 mg total) by mouth daily., Disp: 90 tablet, Rfl: 3   nystatin cream (MYCOSTATIN), Apply to external affected area twice a day for 10 days as needed., Disp: 30 g, Rfl: 1   pantoprazole (PROTONIX) 40 MG tablet, TAKE 1 TABLET BY MOUTH 1 TO 2 TIMES DAILY, Disp: 90 tablet, Rfl: 1   polyethylene glycol powder (GLYCOLAX/MIRALAX) 17 GM/SCOOP powder, Take 17 g by mouth daily., Disp: 3350 g, Rfl: 2   potassium chloride SA (KLOR-CON M) 20 MEQ tablet, TAKE 1 TABLET(20 MEQ) BY MOUTH DAILY, Disp: 30 tablet, Rfl: 3   promethazine (PHENERGAN) 12.5 MG tablet, TAKE 1 TABLET BY MOUTH EVERY 6 HOURS AS NEEDED FOR NAUSEA OR VOMITING, Disp: 30 tablet, Rfl: 6   rivaroxaban (XARELTO) 20 MG TABS tablet, Take 1 tablet (20 mg total) by mouth daily with supper., Disp: 90 tablet, Rfl: 1  RIVAROXABAN (XARELTO) VTE STARTER PACK (15 & 20 MG), Follow package directions: Take one 61m tablet by mouth twice a day. On day 22, switch to one 217mtablet once a day. Take with food., Disp: 51 each, Rfl: 0   traMADol-acetaminophen (ULTRACET) 37.5-325 MG tablet, TAKE 1 TABLET BY MOUTH EVERY 6 HOURS AS NEEDED, Disp: 60 tablet, Rfl: 5   verapamil (CALAN-SR) 240 MG CR tablet, TAKE 1 TABLET(240 MG) BY MOUTH DAILY, Disp: 90 tablet, Rfl: 1  Social History   Tobacco Use  Smoking Status Never  Smokeless Tobacco Never    No Known Allergies Objective:  There were no vitals filed for this visit. There is no height or weight on file to calculate BMI. Constitutional Well developed. Well nourished.  Vascular Dorsalis pedis pulses palpable  bilaterally. Posterior tibial pulses palpable bilaterally. Capillary refill normal to all digits.  No cyanosis or clubbing noted. Pedal hair growth normal.  Neurologic Normal speech. Oriented to person, place, and time. Epicritic sensation to light touch grossly present bilaterally.  Dermatologic Nails well groomed and normal in appearance. No open wounds. No skin lesions.  Orthopedic: Right severe predislocation syndrome of the second noted with dorsal breakdown of the skin.  Mild pain on palpation.  Severe bunion deformity noted.  No open wounds or lesion noted   Radiographs: None Assessment:   1. Predislocation syndrome of metatarsophalangeal joint of left foot    Plan:  Patient was evaluated and treated and all questions answered.  Right second digit predislocation syndrome -All of the concerns were discussed with the patient in extensive detail -At this time I discussed the option of shoe gear modification with possible cut out from the top of the shoe to make space for the second toe.  She states understanding will work on that.  Ultimately she will need an elective amputation of the second digit to give her full relief.  She would like to think about that and will get back to me when she is ready No follow-ups on file.

## 2023-03-05 ENCOUNTER — Encounter: Payer: Self-pay | Admitting: Internal Medicine

## 2023-03-05 ENCOUNTER — Ambulatory Visit (INDEPENDENT_AMBULATORY_CARE_PROVIDER_SITE_OTHER): Payer: Medicare Other | Admitting: Internal Medicine

## 2023-03-05 VITALS — BP 132/76 | HR 81 | Temp 98.6°F | Resp 16 | Ht <= 58 in

## 2023-03-05 DIAGNOSIS — I82511 Chronic embolism and thrombosis of right femoral vein: Secondary | ICD-10-CM | POA: Diagnosis not present

## 2023-03-05 DIAGNOSIS — I10 Essential (primary) hypertension: Secondary | ICD-10-CM | POA: Diagnosis not present

## 2023-03-05 DIAGNOSIS — N1832 Chronic kidney disease, stage 3b: Secondary | ICD-10-CM | POA: Diagnosis not present

## 2023-03-05 DIAGNOSIS — D6869 Other thrombophilia: Secondary | ICD-10-CM

## 2023-03-05 LAB — CBC WITH DIFFERENTIAL/PLATELET
Basophils Absolute: 0.1 10*3/uL (ref 0.0–0.1)
Basophils Relative: 1.3 % (ref 0.0–3.0)
Eosinophils Absolute: 0.2 10*3/uL (ref 0.0–0.7)
Eosinophils Relative: 3.6 % (ref 0.0–5.0)
HCT: 30.5 % — ABNORMAL LOW (ref 36.0–46.0)
Hemoglobin: 10.2 g/dL — ABNORMAL LOW (ref 12.0–15.0)
Lymphocytes Relative: 14 % (ref 12.0–46.0)
Lymphs Abs: 0.7 10*3/uL (ref 0.7–4.0)
MCHC: 33.2 g/dL (ref 30.0–36.0)
MCV: 84.1 fl (ref 78.0–100.0)
Monocytes Absolute: 0.4 10*3/uL (ref 0.1–1.0)
Monocytes Relative: 8.9 % (ref 3.0–12.0)
Neutro Abs: 3.6 10*3/uL (ref 1.4–7.7)
Neutrophils Relative %: 72.2 % (ref 43.0–77.0)
Platelets: 354 10*3/uL (ref 150.0–400.0)
RBC: 3.63 Mil/uL — ABNORMAL LOW (ref 3.87–5.11)
RDW: 16.8 % — ABNORMAL HIGH (ref 11.5–15.5)
WBC: 4.9 10*3/uL (ref 4.0–10.5)

## 2023-03-05 LAB — BASIC METABOLIC PANEL
BUN: 22 mg/dL (ref 6–23)
CO2: 31 mEq/L (ref 19–32)
Calcium: 9.3 mg/dL (ref 8.4–10.5)
Chloride: 103 mEq/L (ref 96–112)
Creatinine, Ser: 0.82 mg/dL (ref 0.40–1.20)
GFR: 64.3 mL/min (ref >60.00)
Glucose, Bld: 78 mg/dL (ref 70–99)
Potassium: 3.8 mEq/L (ref 3.5–5.1)
Sodium: 143 mEq/L (ref 135–145)

## 2023-03-05 MED ORDER — RIVAROXABAN 10 MG PO TABS: 10.0000 mg | ORAL_TABLET | Freq: Every day | ORAL | 1 refills | Status: DC

## 2023-03-05 NOTE — Patient Instructions (Signed)
Deep Vein Thrombosis  Deep vein thrombosis (DVT) is a condition in which a blood clot forms in a vein of the deep venous system. This can occur in the lower leg, thigh, pelvis, arm, or neck. A clot is blood that has thickened into a gel or solid. This condition is serious and can be life-threatening if the clot travels to the arteries of the lungs and causes a blockage (pulmonary embolism). A DVT can also damage veins in the leg, which can lead to long-term venous disease, leg pain, swelling, discoloration, and ulcers or sores (post-thrombotic syndrome). What are the causes? This condition may be caused by: A slowdown of blood flow. Damage to a vein. A condition that causes blood to clot more easily, such as certain bleeding disorders. What increases the risk? The following factors may make you more likely to develop this condition: Obesity. Being older, especially older than age 60. Being inactive or not moving around (sedentary lifestyle). This may include: Sitting or lying down for longer than 4-6 hours other than to sleep at night. Being in the hospital, or having major or lengthy surgery. Having any recent bone injuries, such as breaks (fractures), that reduce movement, especially in the lower extremities. Having recent orthopedic surgery on the lower extremities. Being pregnant, giving birth, or having recently given birth. Taking medicines that contain estrogen, such as birth control or hormone replacement therapy. Using products that contain nicotine or tobacco, especially if you use hormonal birth control. Having a history of a blood vessel disease (peripheral vascular disease) or congestive heart disease. Having a history of cancer, especially if being treated with chemotherapy. What are the signs or symptoms? Symptoms of this condition include: Swelling, pain, pressure, or tenderness in an arm or a leg. An arm or a leg becoming warm, red, or discolored. A leg turning very pale or  blue. You may have a large DVT. This is rare. If the clot is in your leg, you may notice that symptoms get worse when you stand or walk. In some cases, there are no symptoms. How is this diagnosed? This condition is diagnosed with: Your medical history and a physical exam. Tests, such as: Blood tests to check how well your blood clots. Doppler ultrasound. This is the best way to find a DVT. CT venogram. Contrast dye is injected into a vein, and X-rays are taken to check for clots. This is helpful for veins in the chest or pelvis. How is this treated? Treatment for this condition depends on: The cause of your DVT. The size and location of your DVT, or having more than one DVT. Your risk for bleeding or developing more clots. Other medical conditions you may have. Treatment may include: Taking a blood thinner medicine (anticoagulant) to prevent more clots from forming or current clots from growing. Wearing compression stockings. Injecting medicines into the affected vein to break up the clot (catheter-directed thrombolysis). Surgical procedures, when DVT is severe or hard to treat. These may be done to: Isolate and remove your clot. Place an inferior vena cava (IVC) filter. This filter is placed into a large vein called the inferior vena cava to catch blood clots before they reach your lungs. You may get some medical treatments for 6 months or longer. Follow these instructions at home: If you are taking blood thinners: Talk with your health care provider before you take any medicines that contain aspirin or NSAIDs, such as ibuprofen. These medicines increase your risk for dangerous bleeding. Take your medicine exactly   as told, at the same time every day. Do not skip a dose. Do not take more than the prescribed dose. This is important. Ask your health care provider about foods and medicines that could change or interact with the way your blood thinner works. Avoid these foods and medicines  if you are told to do so. Avoid anything that may cause bleeding or bruising. You may bleed more easily while taking blood thinners. Be very careful when using knives, scissors, or other sharp objects. Use an electric razor instead of a blade. Avoid activities that could cause injury or bruising, and follow instructions for preventing falls. Tell your health care provider if you have had any internal bleeding, bleeding ulcers, or neurologic diseases, such as strokes or cerebral aneurysms. Wear a medical alert bracelet or carry a card that lists what medicines you take. General instructions Take over-the-counter and prescription medicines only as told by your health care provider. Return to your normal activities as told by your health care provider. Ask your health care provider what activities are safe for you. If recommended, wear compression stockings as told by your health care provider. These stockings help to prevent blood clots and reduce swelling in your legs. Never wear your compression stockings while sleeping at night. Keep all follow-up visits. This is important. Where to find more information American Heart Association: www.heart.org Centers for Disease Control and Prevention: www.cdc.gov National Heart, Lung, and Blood Institute: www.nhlbi.nih.gov Contact a health care provider if: You miss a dose of your blood thinner. You have unusual bruising or other color changes. You have new or worse pain, swelling, or redness in an arm or a leg. You have worsening numbness or tingling in an arm or a leg. You have a significant color change (pale or blue) in the extremity that has the DVT. Get help right away if: You have signs or symptoms that a blood clot has moved to the lungs. These may include: Shortness of breath. Chest pain. Fast or irregular heartbeats (palpitations). Light-headedness, dizziness, or fainting. Coughing up blood. You have signs or symptoms that your blood is  too thin. These may include: Blood in your vomit, stool, or urine. A cut that will not stop bleeding. A menstrual period that is heavier than usual. A severe headache or confusion. These symptoms may be an emergency. Get help right away. Call 911. Do not wait to see if the symptoms will go away. Do not drive yourself to the hospital. Summary Deep vein thrombosis (DVT) happens when a blood clot forms in a deep vein. This may occur in the lower leg, thigh, pelvis, arm, or neck. Symptoms affect the arm or leg and can include swelling, pain, tenderness, warmth, redness, or discoloration. This condition may be treated with medicines. In severe cases, a procedure or surgery may be done to remove or dissolve the clots. If you are taking blood thinners, take them exactly as told. Do not skip a dose. Do not take more than is prescribed. Get help right away if you have a severe headache, shortness of breath, chest pain, fast or irregular heartbeats, or blood in your vomit, urine, or stool. This information is not intended to replace advice given to you by your health care provider. Make sure you discuss any questions you have with your health care provider. Document Revised: 07/04/2021 Document Reviewed: 07/04/2021 Elsevier Patient Education  2023 Elsevier Inc.  

## 2023-03-05 NOTE — Progress Notes (Signed)
Subjective:  Patient ID: [name], female    DOB: Jun 21, 1933  Age: 89 y.o. MRN: [id number]  CC: Hypertension   HPI [name] presents for f/up ---  She continues to complain about her feet despite recently seeing a podiatrist.  She is taking the [place] and has had no recent episodes of chest pain, shortness of breath, bleeding, or bruising.  She has less lower extremity edema than she did a few weeks ago.  Outpatient Medications Prior to Visit  Medication Sig Dispense Refill   alum hydroxide-mag trisilicate (GAVISCON) [id number] MG CHEW chewable tablet Chew 1 tablet by mouth 3 (three) times daily as needed for indigestion or heartburn. 270 tablet 1   FEROSUL 325 (65 Fe) MG tablet TAKE 1 TABLET(325 MG) BY MOUTH DAILY WITH BREAKFAST 90 tablet 1   gabapentin (NEURONTIN) 400 MG capsule Take 1 capsule (400 mg total) by mouth at bedtime. 90 capsule 1   hydrochlorothiazide (HYDRODIURIL) 25 MG tablet Take 1 tablet (25 mg total) by mouth daily. 90 tablet 3   hydrocortisone 2.5 % ointment APPLY EXTERNALLY TO THE AFFECTED AREA TWICE DAILY 28.35 g 0   latanoprost (XALATAN) 0.005 % ophthalmic solution INT 1 GTT IN OD QD     lidocaine (LIDODERM) 5 % Place 1 patch onto the skin daily as needed. Apply patch to area most significant pain once per day.  Remove and discard patch within 12 hours of application. 30 patch 1   metoprolol succinate (TOPROL-XL) 25 MG 24 hr tablet Take 1 tablet (25 mg total) by mouth daily. 90 tablet 3   nystatin cream (MYCOSTATIN) Apply to external affected area twice a day for 10 days as needed. 30 g 1   pantoprazole (PROTONIX) 40 MG tablet TAKE 1 TABLET BY MOUTH 1 TO 2 TIMES DAILY 90 tablet 1   polyethylene glycol powder (GLYCOLAX/MIRALAX) 17 GM/SCOOP powder Take 17 g by mouth daily. 3350 g 2   potassium chloride SA (KLOR-CON M) 20 MEQ tablet TAKE 1 TABLET(20 MEQ) BY MOUTH DAILY 30 tablet 3   traMADol-acetaminophen (ULTRACET) 37.5-325 MG tablet TAKE 1 TABLET BY MOUTH EVERY 6  HOURS AS NEEDED 60 tablet 5   verapamil (CALAN-SR) 240 MG CR tablet TAKE 1 TABLET(240 MG) BY MOUTH DAILY 90 tablet 1   cephALEXin (KEFLEX) 500 MG capsule Take 1 capsule (500 mg total) by mouth 2 (two) times daily. 20 capsule 0   promethazine (PHENERGAN) 12.5 MG tablet TAKE 1 TABLET BY MOUTH EVERY 6 HOURS AS NEEDED FOR NAUSEA OR VOMITING 30 tablet 6   rivaroxaban (XARELTO) 20 MG TABS tablet Take 1 tablet (20 mg total) by mouth daily with supper. 90 tablet 1   RIVAROXABAN (XARELTO) VTE STARTER PACK (15 & 20 MG) Follow package directions: Take one 15mg  tablet by mouth twice a day. On day 22, switch to one 20mg  tablet once a day. Take with food. 51 each 0   No facility-administered medications prior to visit.    ROS Review of Systems  Constitutional:  Negative for appetite change, diaphoresis, fatigue and unexpected weight change.  HENT: Negative.  Negative for nosebleeds.   Eyes: Negative.   Respiratory:  Negative for cough, chest tightness, shortness of breath and wheezing.   Cardiovascular:  Positive for leg swelling. Negative for chest pain and palpitations.  Gastrointestinal:  Negative for abdominal pain, anal bleeding, blood in stool, diarrhea, nausea and vomiting.  Endocrine: Negative.   Genitourinary:  Negative for difficulty urinating and hematuria.  Musculoskeletal:  Positive  for arthralgias and back pain. Negative for myalgias.  Skin: Negative.   Allergic/Immunologic: Negative.   Neurological: Negative.  Negative for dizziness and weakness.  Hematological:  Negative for adenopathy. Does not bruise/bleed easily.  Psychiatric/Behavioral: Negative.      Objective:  BP 132/76 (BP Location: Left Arm, Patient Position: Sitting, Cuff Size: Large)   Pulse 81   Temp 98.6 F (37 C) (Oral)   Resp 16   Ht 4\' 10"  (1.473 m)   SpO2 96%   BMI 24.24 kg/m   BP Readings from Last 3 Encounters:  03/05/23 132/76  12/04/22 134/74  09/26/22 112/62    Wt Readings from Last 3 Encounters:   04/24/22 116 lb (52.6 kg)  11/07/21 117 lb (53.1 kg)  07/13/21 116 lb 12.8 oz (53 kg)    Physical Exam Vitals reviewed.  Constitutional:      General: She is not in acute distress.    Appearance: She is not ill-appearing or toxic-appearing.  HENT:     Mouth/Throat:     Mouth: Mucous membranes are moist.  Eyes:     General: No scleral icterus.    Conjunctiva/sclera: Conjunctivae normal.  Cardiovascular:     Rate and Rhythm: Normal rate and regular rhythm.     Heart sounds: No murmur heard. Pulmonary:     Effort: Pulmonary effort is normal.     Breath sounds: No stridor. No wheezing, rhonchi or rales.  Abdominal:     General: Abdomen is scaphoid.     Palpations: There is no hepatomegaly, splenomegaly or mass.     Tenderness: There is no abdominal tenderness.  Musculoskeletal:     Cervical back: Neck supple.     Right lower leg: 1+ Pitting Edema present.     Left lower leg: 1+ Pitting Edema present.  Lymphadenopathy:     Cervical: No cervical adenopathy.  Skin:    General: Skin is warm and dry.     Coloration: Skin is not pale.  Neurological:     General: No focal deficit present.     Mental Status: She is alert. Mental status is at baseline.  Psychiatric:        Mood and Affect: Mood normal.        Behavior: Behavior normal.     Lab Results  Component Value Date   WBC 5.5 09/20/2022   HGB 11.6 (L) 09/20/2022   HCT 34.3 (L) 09/20/2022   PLT 391.0 09/20/2022   GLUCOSE 103 (H) 09/20/2022   CHOL 175 11/03/2019   TRIG 79 11/03/2019   HDL 82 11/03/2019   LDLCALC 78 11/03/2019   ALT 10 04/14/2022   AST 16 04/14/2022   NA 142 09/20/2022   K 4.0 09/20/2022   CL 102 09/20/2022   CREATININE 0.82 09/20/2022   BUN 19 09/20/2022   CO2 35 (H) 09/20/2022   TSH 1.86 05/05/2021   HGBA1C 5.4 05/30/2017    VAS [place] LOWER EXTREMITY VENOUS (DVT)  Result Date: 09/21/2022  Lower Venous DVT Study Patient Name:  [name]  Date of Exam:   09/20/2022 Medical Rec #: [id number]     Accession #:    [id number] Date of Birth: 1933/10/03    Patient Gender: F Patient Age:   32 years Exam Location:  [facility] Procedure:      VAS [place] LOWER EXTREMITY VENOUS (DVT) Referring Phys: Loletha Carrow HENSON --------------------------------------------------------------------------------  Indications: Increase bilateral lower legs x 3 months. Worsening right lower extremity and redness x 2 weeks. Report is given by  niece. Patient denies any abnormal SOB.  Risk Factors: Trauma recent fall in her bathroom in 07/2022. Comparison Study: NA Performing Technologist: Sharlett Iles RVT  Examination Guidelines: A complete evaluation includes B-mode imaging, spectral Doppler, color Doppler, and power Doppler as needed of all accessible portions of each vessel. Bilateral testing is considered an integral part of a complete examination. Limited examinations for reoccurring indications may be performed as noted. The reflux portion of the exam is performed with the patient in reverse Trendelenburg.  +---------+---------------+---------+-----------+---------------+--------------+ RIGHT    CompressibilityPhasicitySpontaneityProperties     Thrombus Aging +---------+---------------+---------+-----------+---------------+--------------+ CFV      Full           No       Yes                                      +---------+---------------+---------+-----------+---------------+--------------+ SFJ      Full           No       Yes                                      +---------+---------------+---------+-----------+---------------+--------------+ FV Prox  Full           No       Yes                                      +---------+---------------+---------+-----------+---------------+--------------+ FV Mid   Full           No       Yes                                      +---------+---------------+---------+-----------+---------------+--------------+ FV DistalNone           No       No         softly          Acute                                                      echogenic                     +---------+---------------+---------+-----------+---------------+--------------+ PFV      Full                    Yes                                      +---------+---------------+---------+-----------+---------------+--------------+ POP      None           No       No         softly         Acute  echogenic                     +---------+---------------+---------+-----------+---------------+--------------+ PTV      None           No       No         softly         Acute                                                      echogenic                     +---------+---------------+---------+-----------+---------------+--------------+ PERO     None           No       No         softly         Acute                                                      echogenic                     +---------+---------------+---------+-----------+---------------+--------------+ Gastroc  None           No       No         softly         Acute                                                      echogenic                     +---------+---------------+---------+-----------+---------------+--------------+ GSV      Full           Yes                                               +---------+---------------+---------+-----------+---------------+--------------+ TPT      None           No       No         softly         Acute                                                      echogenic and                                                             dilated at 1.1  cm AP x 1.3 cm                                                            TRV                            +---------+---------------+---------+-----------+---------------+--------------+   Right Technical Findings: Acute occlusive thrombus in the distal femoral, popliteal, gastrocnemius, one of the paired posterior tibial, one of the paired peroneal veins and TPT. Pulsatile venous flow noted in the CFV, SFJ, proximal and mid femoral vein.  +----+---------------+---------+-----------+----------+--------------+ LEFTCompressibilityPhasicitySpontaneityPropertiesThrombus Aging +----+---------------+---------+-----------+----------+--------------+ CFV Full                    Yes                                 +----+---------------+---------+-----------+----------+--------------+   Left Technical Findings: Pulsatile venous flow noted in the CFV.  Findings reported to [facility] at 3:00 pm.  Summary: RIGHT: - Findings consistent with acute deep vein thrombosis involving the right femoral vein, right popliteal vein, right posterior tibial veins, right peroneal veins, right gastrocnemius veins, and tibioperoneal trunk. - No cystic structure found in the popliteal fossa. - Pulsatile venous flow noted in the CFV, SFJ, proximal and mid femoral vein. - All other veins visualized appear fully compressible and demonstrate appropriate Doppler characteristics.  LEFT: - No evidence of common femoral vein obstruction. - Pulsatile venous flow noted in the CFV. -Pulsatile lower limb venous Doppler waveform correlates well with increase right atrium pressure; right-sided heart failure.  *See table(s) above for measurements and observations. Electronically signed by Ida Rogue MD on 09/21/2022 at 2:39:48 PM.    Final     Assessment & Plan:   Helon was seen today for hypertension.  Diagnoses and all orders for this visit:  Acquired thrombophilia ([place])- Will continue the [place]. -     rivaroxaban (XARELTO) 10 MG TABS tablet; Take 1 tablet (10 mg total) by mouth daily.  Chronic deep vein thrombosis (DVT) of right femoral  vein (HCC) -     rivaroxaban (XARELTO) 10 MG TABS tablet; Take 1 tablet (10 mg total) by mouth daily.  Stage 3b chronic kidney disease ([place])- Her renal function is stable. -     Basic metabolic panel; Future -     CBC with Differential/Platelet; Future -     CBC with Differential/Platelet -     Basic metabolic panel  Essential hypertension- Her blood pressure is adequately well-controlled. -     Basic metabolic panel; Future -     CBC with Differential/Platelet; Future -     CBC with Differential/Platelet -     Basic metabolic panel   I have discontinued Caedence Mcginniss's promethazine, cephALEXin, Rivaroxaban Stater Pack (15 mg and 20 mg), and rivaroxaban. I am also having her start on rivaroxaban. Additionally, I am having her maintain her hydrocortisone, latanoprost, nystatin cream, lidocaine, polyethylene glycol powder, gabapentin, hydrochlorothiazide, metoprolol succinate, alum hydroxide-mag trisilicate, FeroSul, traMADol-acetaminophen, pantoprazole, potassium chloride SA, and verapamil.  Meds ordered this encounter  Medications   rivaroxaban (XARELTO) 10 MG TABS tablet    Sig: Take 1 tablet (10 mg total) by mouth daily.    Dispense:  90 tablet    Refill:  1     Follow-up: No follow-ups on file.  Scarlette Calico, MD

## 2023-03-06 ENCOUNTER — Encounter: Payer: Self-pay | Admitting: Internal Medicine

## 2023-03-21 ENCOUNTER — Ambulatory Visit (INDEPENDENT_AMBULATORY_CARE_PROVIDER_SITE_OTHER): Payer: Medicare Other | Admitting: Podiatrist

## 2023-03-21 ENCOUNTER — Encounter: Payer: Self-pay | Admitting: Podiatrist

## 2023-03-21 DIAGNOSIS — M79676 Pain in unspecified toe(s): Secondary | ICD-10-CM

## 2023-03-21 DIAGNOSIS — R609 Edema, unspecified: Secondary | ICD-10-CM | POA: Diagnosis not present

## 2023-03-21 DIAGNOSIS — B351 Tinea unguium: Secondary | ICD-10-CM | POA: Diagnosis not present

## 2023-03-21 DIAGNOSIS — R600 Localized edema: Secondary | ICD-10-CM

## 2023-03-21 DIAGNOSIS — L84 Corns and callosities: Secondary | ICD-10-CM

## 2023-03-21 NOTE — Progress Notes (Signed)
  Subjective:  Patient ID: [name], female    DOB: [birth year],  MRN: [id number]  Kei Hugger presents to clinic today for painful porokeratotic lesion(s) right lower extremity and painful mycotic toenails that limit ambulation. Painful toenails interfere with ambulation. Aggravating factors include wearing enclosed shoe gear. Pain is relieved with periodic professional debridement. Painful porokeratotic lesions are aggravated when weightbearing with and without shoegear. Pain is relieved with periodic professional debridement.   She is accompanied by her niece on today's visit.  Chief Complaint  Patient presents with   Nail Problem    [facility] PCP-Thomas Jones PCP VST-02/2023    New problem(s): Patient was diagnosed with DVT of right femoral vein and is now on Xarelto.  PCP is Janith Lima, MD.  No Known Allergies  Review of Systems: Negative except as noted in the HPI.  Objective: No changes noted in today's physical examination. There were no vitals filed for this visit.  [name] is a pleasant 89 y.o. female frail, in NAD. AAO x 3. Vascular Examination: CFT <3 seconds b/l. DP/PT pulses faintly palpable b/l. Skin temperature gradient warm to warm b/l. No pain with calf compression. No ischemia or gangrene. No cyanosis or clubbing noted b/l. Trace edema noted BLE.   Neurological Examination: Sensation grossly intact b/l with 10 gram monofilament. Vibratory sensation intact b/l.   Dermatological Examination: Pedal skin thin, shiny and atrophic b/l LE. Toenails 1-5 b/l elongated, discolored, dystrophic, thickened, crumbly with subungual debris and tenderness to dorsal palpation.   Hyperkeratotic lesion(s)  plantar heel, R 2nd toe, submet head 5 right foot, and sub 5th met base right lower extremity.  No erythema, no edema, no drainage, no fluctuance.  Musculoskeletal Examination: Muscle strength 5/5 to b/l LE. HAV with bunion bilaterally and hammertoes 2-5 b/l. Plantar fat  pad atrophy b/l lower extremities. Utilizes wheelchair for mobility assistance.  Radiographs: None  Assessment/Plan:   ICD-10-CM   1. Pain due to onychomycosis of toenail  B35.1    M79.676     2. Peripheral edema  R60.9     3. Corns and callosities  L84        -Patient's family member present. All questions/concerns addressed on today's visit. -Examined patient. -Toenails 1-5 b/l were debrided in length and girth with sterile nail nippers and dremel without iatrogenic bleeding.  -Porokeratotic lesion(s) plantar heel pad of right foot, R 2nd toe, and sub 5th met base right lower extremity pared and enucleated with sterile currette without incident. Total number of lesions debrided=3. -Patient/POA to call should there be question/concern in the interim.     Bronson Ing, DPM

## 2023-03-29 DIAGNOSIS — H04123 Dry eye syndrome of bilateral lacrimal glands: Secondary | ICD-10-CM | POA: Diagnosis not present

## 2023-03-29 DIAGNOSIS — H1045 Other chronic allergic conjunctivitis: Secondary | ICD-10-CM | POA: Diagnosis not present

## 2023-03-29 DIAGNOSIS — H401131 Primary open-angle glaucoma, bilateral, mild stage: Secondary | ICD-10-CM | POA: Diagnosis not present

## 2023-04-17 ENCOUNTER — Other Ambulatory Visit: Payer: Self-pay | Admitting: Internal Medicine

## 2023-04-17 ENCOUNTER — Telehealth: Payer: Self-pay

## 2023-04-17 DIAGNOSIS — I82411 Acute embolism and thrombosis of right femoral vein: Secondary | ICD-10-CM

## 2023-04-17 MED ORDER — POTASSIUM CHLORIDE CRYS ER 20 MEQ PO TBCR: EXTENDED_RELEASE_TABLET | ORAL | 3 refills | Status: DC

## 2023-04-17 NOTE — Telephone Encounter (Signed)
Pt states she is having leg cramps especially at night and is needing something to be sent in.

## 2023-04-22 ENCOUNTER — Other Ambulatory Visit: Payer: Self-pay | Admitting: Internal Medicine

## 2023-04-23 ENCOUNTER — Other Ambulatory Visit: Payer: Self-pay

## 2023-04-23 ENCOUNTER — Emergency Department (HOSPITAL_COMMUNITY): Payer: Medicare Other

## 2023-04-23 ENCOUNTER — Encounter (HOSPITAL_COMMUNITY): Payer: Self-pay | Admitting: Emergency Medicine

## 2023-04-23 ENCOUNTER — Inpatient Hospital Stay (HOSPITAL_COMMUNITY)
Admission: EM | Admit: 2023-04-23 | Discharge: 2023-04-27 | DRG: 381 | Disposition: A | Discharge status: Home Health | Payer: Medicare Other | Attending: Internal Medicine | Admitting: Internal Medicine

## 2023-04-23 DIAGNOSIS — Z86718 Personal history of other venous thrombosis and embolism: Secondary | ICD-10-CM

## 2023-04-23 DIAGNOSIS — M810 Age-related osteoporosis without current pathological fracture: Secondary | ICD-10-CM | POA: Diagnosis present

## 2023-04-23 DIAGNOSIS — N1832 Chronic kidney disease, stage 3b: Secondary | ICD-10-CM | POA: Diagnosis present

## 2023-04-23 DIAGNOSIS — Z7901 Long term (current) use of anticoagulants: Secondary | ICD-10-CM | POA: Diagnosis not present

## 2023-04-23 DIAGNOSIS — D62 Acute posthemorrhagic anemia: Secondary | ICD-10-CM | POA: Diagnosis present

## 2023-04-23 DIAGNOSIS — K2100 Gastro-esophageal reflux disease with esophagitis, without bleeding: Secondary | ICD-10-CM

## 2023-04-23 DIAGNOSIS — K2211 Ulcer of esophagus with bleeding: Principal | ICD-10-CM | POA: Diagnosis present

## 2023-04-23 DIAGNOSIS — K5900 Constipation, unspecified: Secondary | ICD-10-CM | POA: Diagnosis present

## 2023-04-23 DIAGNOSIS — K449 Diaphragmatic hernia without obstruction or gangrene: Secondary | ICD-10-CM | POA: Diagnosis not present

## 2023-04-23 DIAGNOSIS — J9811 Atelectasis: Secondary | ICD-10-CM | POA: Diagnosis not present

## 2023-04-23 DIAGNOSIS — Z90711 Acquired absence of uterus with remaining cervical stump: Secondary | ICD-10-CM | POA: Diagnosis not present

## 2023-04-23 DIAGNOSIS — K2950 Unspecified chronic gastritis without bleeding: Secondary | ICD-10-CM | POA: Diagnosis not present

## 2023-04-23 DIAGNOSIS — Z7982 Long term (current) use of aspirin: Secondary | ICD-10-CM | POA: Diagnosis not present

## 2023-04-23 DIAGNOSIS — D649 Anemia, unspecified: Secondary | ICD-10-CM | POA: Diagnosis not present

## 2023-04-23 DIAGNOSIS — H409 Unspecified glaucoma: Secondary | ICD-10-CM | POA: Diagnosis not present

## 2023-04-23 DIAGNOSIS — I82511 Chronic embolism and thrombosis of right femoral vein: Secondary | ICD-10-CM | POA: Diagnosis not present

## 2023-04-23 DIAGNOSIS — D509 Iron deficiency anemia, unspecified: Secondary | ICD-10-CM | POA: Diagnosis not present

## 2023-04-23 DIAGNOSIS — K2270 Barrett's esophagus without dysplasia: Secondary | ICD-10-CM | POA: Diagnosis not present

## 2023-04-23 DIAGNOSIS — K219 Gastro-esophageal reflux disease without esophagitis: Secondary | ICD-10-CM | POA: Diagnosis not present

## 2023-04-23 DIAGNOSIS — K2289 Other specified disease of esophagus: Secondary | ICD-10-CM | POA: Diagnosis not present

## 2023-04-23 DIAGNOSIS — F0390 Unspecified dementia without behavioral disturbance: Secondary | ICD-10-CM | POA: Diagnosis not present

## 2023-04-23 DIAGNOSIS — Z9841 Cataract extraction status, right eye: Secondary | ICD-10-CM

## 2023-04-23 DIAGNOSIS — D508 Other iron deficiency anemias: Secondary | ICD-10-CM

## 2023-04-23 DIAGNOSIS — G43909 Migraine, unspecified, not intractable, without status migrainosus: Secondary | ICD-10-CM | POA: Diagnosis present

## 2023-04-23 DIAGNOSIS — Z8249 Family history of ischemic heart disease and other diseases of the circulatory system: Secondary | ICD-10-CM | POA: Diagnosis not present

## 2023-04-23 DIAGNOSIS — I10 Essential (primary) hypertension: Secondary | ICD-10-CM | POA: Diagnosis present

## 2023-04-23 DIAGNOSIS — R531 Weakness: Secondary | ICD-10-CM

## 2023-04-23 DIAGNOSIS — I3139 Other pericardial effusion (noninflammatory): Secondary | ICD-10-CM | POA: Diagnosis not present

## 2023-04-23 DIAGNOSIS — I2510 Atherosclerotic heart disease of native coronary artery without angina pectoris: Secondary | ICD-10-CM | POA: Diagnosis not present

## 2023-04-23 DIAGNOSIS — K2970 Gastritis, unspecified, without bleeding: Secondary | ICD-10-CM | POA: Diagnosis not present

## 2023-04-23 DIAGNOSIS — Z9842 Cataract extraction status, left eye: Secondary | ICD-10-CM | POA: Diagnosis not present

## 2023-04-23 DIAGNOSIS — R42 Dizziness and giddiness: Secondary | ICD-10-CM | POA: Diagnosis not present

## 2023-04-23 DIAGNOSIS — R410 Disorientation, unspecified: Secondary | ICD-10-CM | POA: Diagnosis not present

## 2023-04-23 DIAGNOSIS — R4182 Altered mental status, unspecified: Secondary | ICD-10-CM | POA: Diagnosis not present

## 2023-04-23 DIAGNOSIS — Z79899 Other long term (current) drug therapy: Secondary | ICD-10-CM

## 2023-04-23 DIAGNOSIS — I129 Hypertensive chronic kidney disease with stage 1 through stage 4 chronic kidney disease, or unspecified chronic kidney disease: Secondary | ICD-10-CM | POA: Diagnosis present

## 2023-04-23 DIAGNOSIS — E785 Hyperlipidemia, unspecified: Secondary | ICD-10-CM | POA: Diagnosis present

## 2023-04-23 DIAGNOSIS — K209 Esophagitis, unspecified without bleeding: Secondary | ICD-10-CM | POA: Diagnosis not present

## 2023-04-23 DIAGNOSIS — K5710 Diverticulosis of small intestine without perforation or abscess without bleeding: Secondary | ICD-10-CM | POA: Diagnosis not present

## 2023-04-23 DIAGNOSIS — I82409 Acute embolism and thrombosis of unspecified deep veins of unspecified lower extremity: Secondary | ICD-10-CM | POA: Diagnosis not present

## 2023-04-23 LAB — I-STAT VENOUS BLOOD GAS, ED
Acid-Base Excess: 3 mmol/L — ABNORMAL HIGH (ref 0.0–2.0)
Bicarbonate: 27.2 mmol/L (ref 20.0–28.0)
Calcium, Ion: 1.15 mmol/L (ref 1.15–1.40)
HCT: 18 % — ABNORMAL LOW (ref 36.0–46.0)
Hemoglobin: 6.1 g/dL — CL (ref 12.0–15.0)
O2 Saturation: 88 %
Potassium: 3.6 mmol/L (ref 3.5–5.1)
Sodium: 142 mmol/L (ref 135–145)
TCO2: 28 mmol/L (ref 22–32)
pCO2, Ven: 38.1 mmHg — ABNORMAL LOW (ref 44–60)
pH, Ven: 7.461 — ABNORMAL HIGH (ref 7.25–7.43)
pO2, Ven: 51 mmHg — ABNORMAL HIGH (ref 32–45)

## 2023-04-23 LAB — TYPE AND SCREEN
ABO/RH(D): O POS
Unit division: 0

## 2023-04-23 LAB — COMPREHENSIVE METABOLIC PANEL
ALT: 13 U/L (ref 0–44)
AST: 15 U/L (ref 15–41)
Albumin: 2.8 g/dL — ABNORMAL LOW (ref 3.5–5.0)
Alkaline Phosphatase: 44 U/L (ref 38–126)
Anion gap: 9 (ref 5–15)
BUN: 14 mg/dL (ref 8–23)
CO2: 28 mmol/L (ref 22–32)
Calcium: 8.5 mg/dL — ABNORMAL LOW (ref 8.9–10.3)
Chloride: 106 mmol/L (ref 98–111)
Creatinine, Ser: 0.88 mg/dL (ref 0.44–1.00)
GFR, Estimated: >60 mL/min (ref >60)
Glucose, Bld: 107 mg/dL — ABNORMAL HIGH (ref 70–99)
Potassium: 3.6 mmol/L (ref 3.5–5.1)
Sodium: 143 mmol/L (ref 135–145)
Total Bilirubin: 0.4 mg/dL (ref 0.3–1.2)
Total Protein: 5.6 g/dL — ABNORMAL LOW (ref 6.5–8.1)

## 2023-04-23 LAB — APTT: aPTT: 30 seconds (ref 24–36)

## 2023-04-23 LAB — CBC WITH DIFFERENTIAL/PLATELET
Abs Immature Granulocytes: 0.02 10*3/uL (ref 0.00–0.07)
Basophils Absolute: 0 10*3/uL (ref 0.0–0.1)
Basophils Relative: 1 %
Eosinophils Absolute: 0.2 10*3/uL (ref 0.0–0.5)
Eosinophils Relative: 4 %
HCT: 18.8 % — ABNORMAL LOW (ref 36.0–46.0)
Hemoglobin: 5.5 g/dL — CL (ref 12.0–15.0)
Immature Granulocytes: 0 %
Lymphocytes Relative: 9 %
Lymphs Abs: 0.4 10*3/uL — ABNORMAL LOW (ref 0.7–4.0)
MCH: 27.1 pg (ref 26.0–34.0)
MCHC: 29.3 g/dL — ABNORMAL LOW (ref 30.0–36.0)
MCV: 92.6 fL (ref 80.0–100.0)
Monocytes Absolute: 0.5 10*3/uL (ref 0.1–1.0)
Monocytes Relative: 11 %
Neutro Abs: 3.8 10*3/uL (ref 1.7–7.7)
Neutrophils Relative %: 75 %
Platelets: 456 10*3/uL — ABNORMAL HIGH (ref 150–400)
RBC: 2.03 MIL/uL — ABNORMAL LOW (ref 3.87–5.11)
RDW: 15.9 % — ABNORMAL HIGH (ref 11.5–15.5)
WBC: 5 10*3/uL (ref 4.0–10.5)
nRBC: 0 % (ref 0.0–0.2)

## 2023-04-23 LAB — PREPARE RBC (CROSSMATCH)

## 2023-04-23 LAB — POC OCCULT BLOOD, ED: Fecal Occult Bld: NEGATIVE

## 2023-04-23 LAB — PROTIME-INR
INR: 1.3 — ABNORMAL HIGH (ref 0.8–1.2)
Prothrombin Time: 15.6 seconds — ABNORMAL HIGH (ref 11.4–15.2)

## 2023-04-23 LAB — ETHANOL: Alcohol, Ethyl (B): <10 mg/dL (ref <10)

## 2023-04-23 LAB — BPAM RBC: Unit Type and Rh: 5100

## 2023-04-23 IMAGING — DX DG LUMBAR SPINE 2-3V
3 series · 3 of 3 positions shown · non-contrast
Comparison: Lumbar spine radiographs 09/29/2015

CLINICAL DATA: Low back pain.  Progressive symptoms.

EXAM:
LUMBAR SPINE - 2-3 VIEW

[l-spine ap]
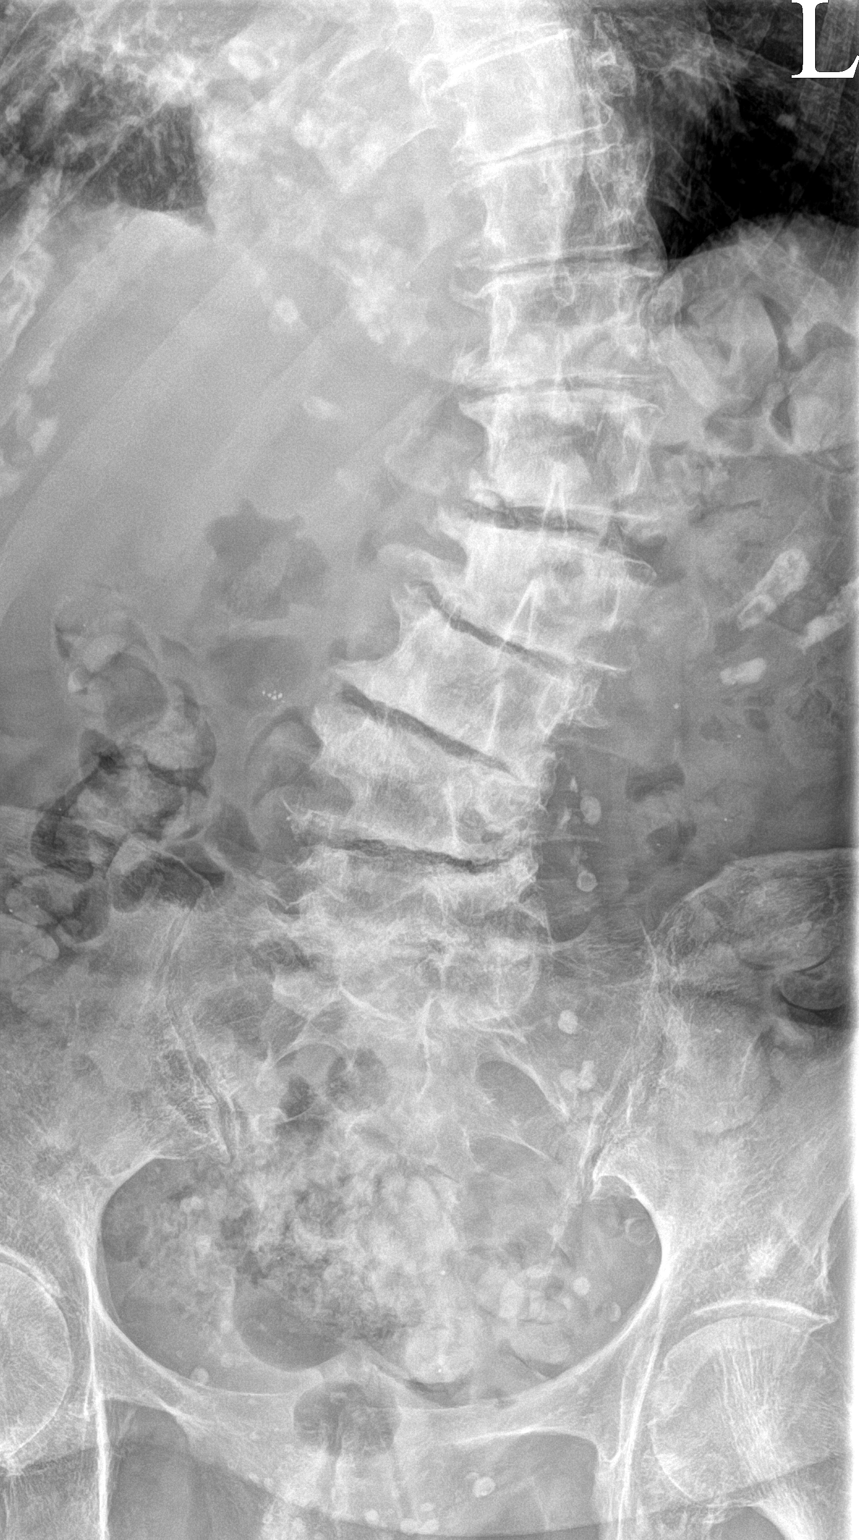

[l-spine lateral]
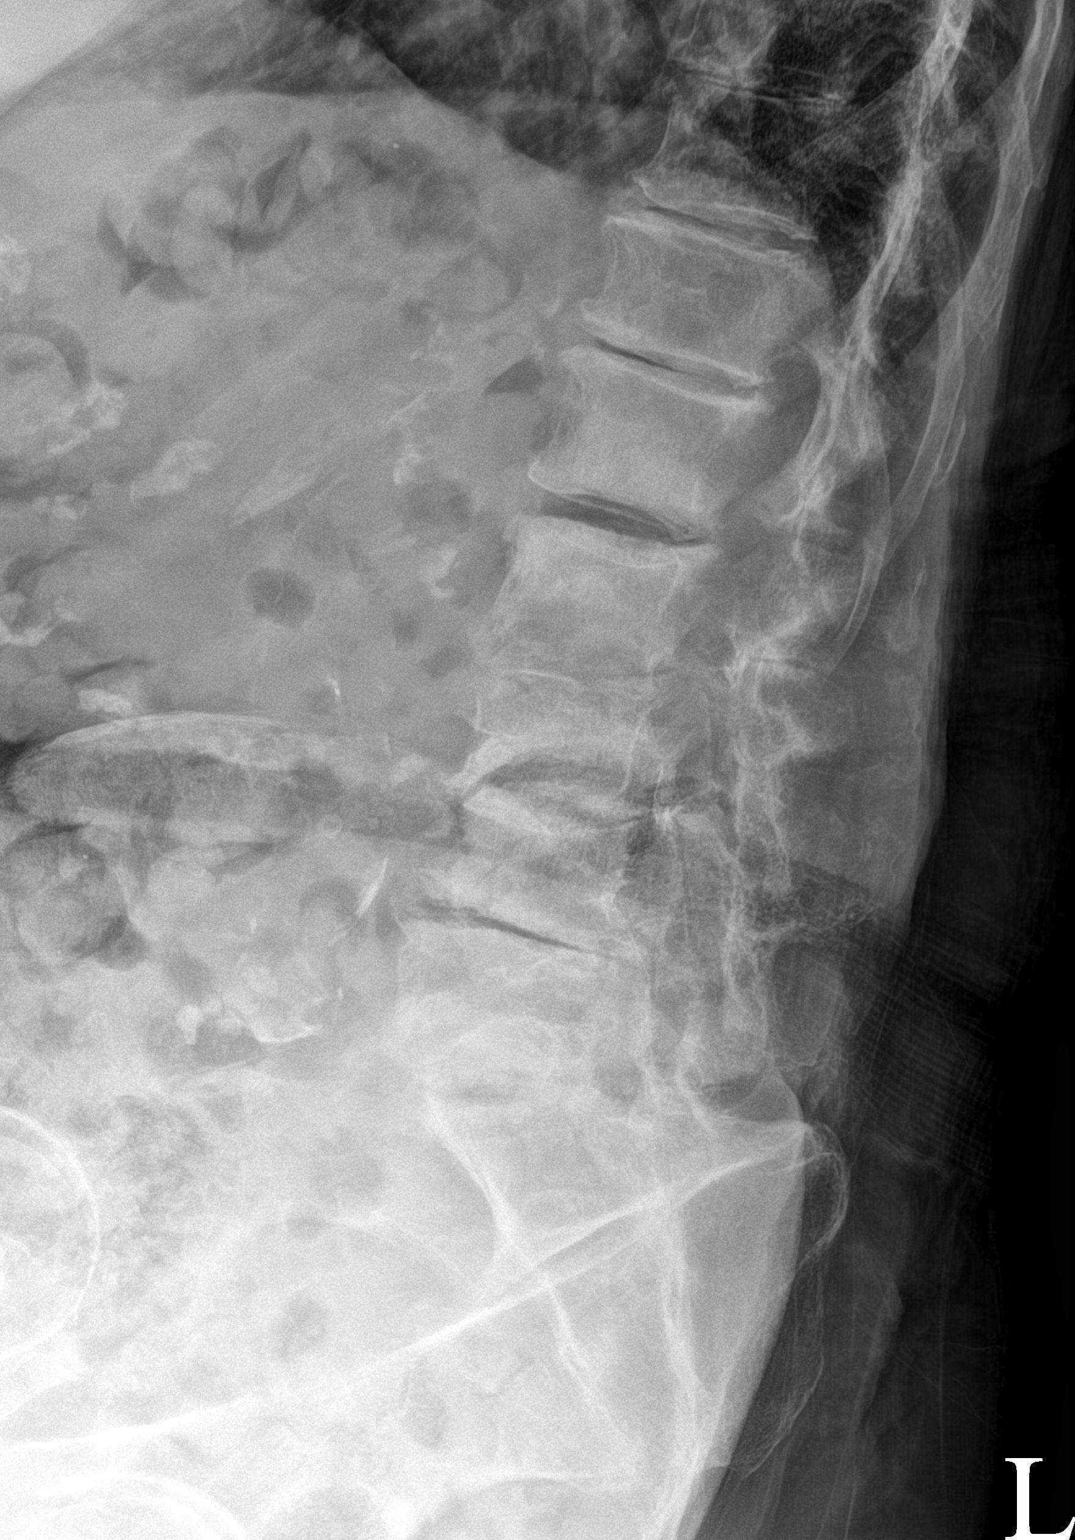

[l-spine spot]
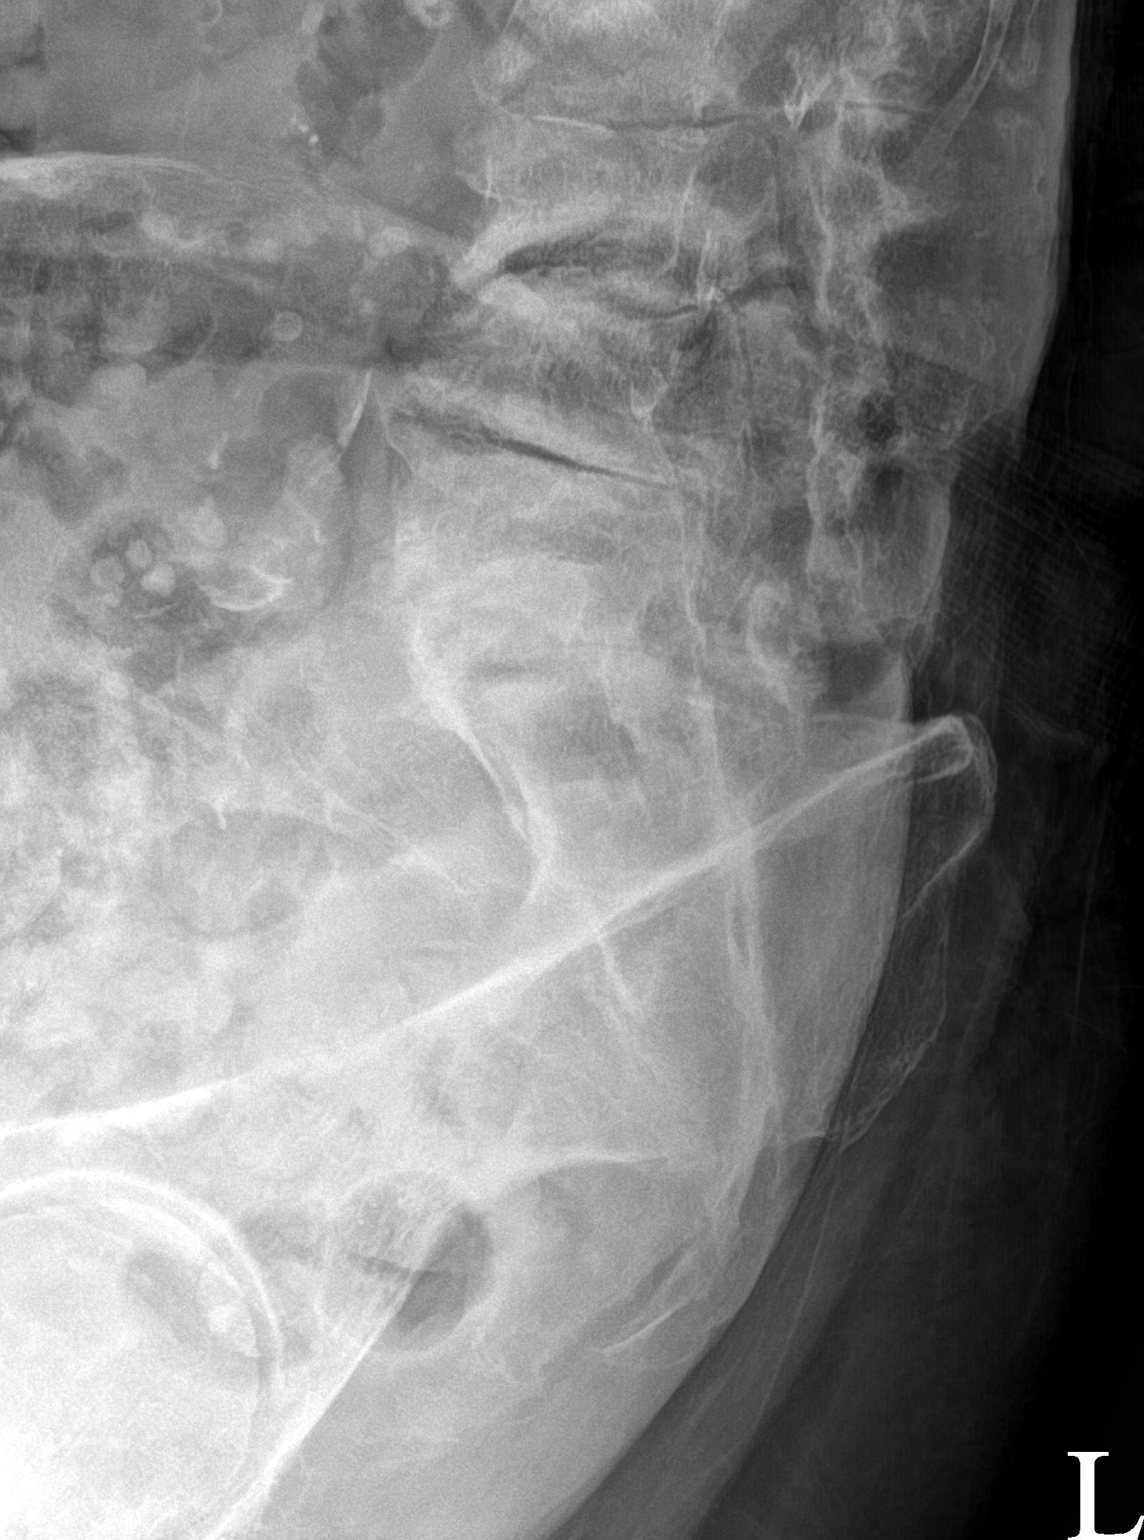

[3 of 3 positions shown; findings below may reference images not displayed]

FINDINGS: Levoconvex curvature is present at the thoracolumbar junction.
Compensatory rightward curvature centered at L4. Progressive
asymmetric left-sided endplate changes are present at L3-4 and L4-5.
Asymmetric right-sided disease is present at L1-2 and L2-3.

No acute fractures or listhesis are present. Straightening of the
normal lumbar lordosis again noted. Atherosclerotic calcifications
are present.
IMPRESSION: 1. Progressive multilevel spondylosis of the lumbar spine with
levoconvex curvature at the thoracolumbar junction and compensatory
rightward curvature at L4.
2. Asymmetric left-sided disease at L3-4 and L4-5.
3. No acute abnormality.

## 2023-04-23 MED ORDER — PANTOPRAZOLE SODIUM 40 MG IV SOLR
40.0000 mg | Freq: Once | INTRAVENOUS | Status: AC
  Administered 2023-04-23: 40 mg via INTRAVENOUS
  Filled 2023-04-23: qty 10

## 2023-04-23 MED ORDER — SODIUM CHLORIDE 0.9% FLUSH
3.0000 mL | Freq: Two times a day (BID) | INTRAVENOUS | Status: DC
  Administered 2023-04-23 – 2023-04-27 (×8): 3 mL via INTRAVENOUS

## 2023-04-23 MED ORDER — PANTOPRAZOLE INFUSION (NEW) - SIMPLE MED
8.0000 mg/h | INTRAVENOUS | Status: DC
  Administered 2023-04-23: 8 mg/h via INTRAVENOUS
  Filled 2023-04-23 (×2): qty 100

## 2023-04-23 MED ORDER — SODIUM CHLORIDE 0.9 % IV BOLUS (SEPSIS)
500.0000 mL | Freq: Once | INTRAVENOUS | Status: AC
  Administered 2023-04-23: 500 mL via INTRAVENOUS

## 2023-04-23 MED ORDER — POLYETHYLENE GLYCOL 3350 17 G PO PACK
17.0000 g | PACK | Freq: Two times a day (BID) | ORAL | Status: DC
  Administered 2023-04-23 – 2023-04-27 (×7): 17 g via ORAL
  Filled 2023-04-23 (×7): qty 1

## 2023-04-23 MED ORDER — ACETAMINOPHEN 325 MG PO TABS
650.0000 mg | ORAL_TABLET | Freq: Four times a day (QID) | ORAL | Status: DC | PRN
  Administered 2023-04-23 – 2023-04-27 (×3): 650 mg via ORAL
  Filled 2023-04-23 (×3): qty 2

## 2023-04-23 MED ORDER — ACETAMINOPHEN 650 MG RE SUPP: 650.0000 mg | Freq: Four times a day (QID) | RECTAL | Status: DC | PRN

## 2023-04-23 MED ORDER — SODIUM CHLORIDE 0.9% IV SOLUTION: Freq: Once | INTRAVENOUS | Status: AC

## 2023-04-23 MED ORDER — PANTOPRAZOLE SODIUM 40 MG IV SOLR: 40.0000 mg | Freq: Two times a day (BID) | INTRAVENOUS | Status: DC

## 2023-04-23 NOTE — ED Notes (Signed)
Hemoccult reads NEGATIVE. ED-Lab.

## 2023-04-23 NOTE — H&P (View-Only) (Signed)
   Attending physician's note   I have taken a history, reviewed the chart, and examined the patient. I performed a substantive portion of this encounter, including complete performance of at least one of the key components, in conjunction with the APP. I agree with the APP's note, impression, and recommendations with my edits.   89-year-old female with medical history as outlined below, presents with acute on chronic anemia and dizziness.  Does have a history of constipation which requires regular manual disimpaction, and will occasionally see BRB.  Otherwise denies melena.  Occasional nausea without emesis.  Does have a history of GERD with erosive esophagitis, a large 9 cm hiatal hernia, and short segment nondysplastic Barrett's Esophagus as outlined below.  Admission evaluation notable for the following: - H/H 5.5/18.8 (baseline Hgb ~10-11) with MCV 92 (baseline ~84) - Albumin 2.8, otherwise normal CMP -INR 1.3 - FOBT negative - CXR: Hiatal hernia - CT head: Negative for acute intracranial pathology  1) Symptomatic anemia Discussed DDx for possible GI bleed with patient and family member at bedside, to include possible Cameron's ulcer (large HH), PUD, gastritis, AVMs, etc.  Plan to start with EGD for diagnostic and therapeutic intent, and if unrevealing, can discuss role/utility of colonoscopy and/or VCE - Transfusing today - Plan for EGD tomorrow for diagnostic and therapeutic intent - Clears okay today with n.p.o. at midnight - High-dose PPI as outlined - Check iron panel, B12, folate  2) Hiatal hernia 3) GERD with erosive esophagitis 4) Short segment nondysplastic Barrett's Esophagus - Continue PPI as above - EGD as above  5) Constipation - Does have history of stercoral ulcer on colonoscopy in 2011.  Describes minimal, scant BRB following episodic manual disimpaction - Increase bowel regimen - If EGD unrevealing, tentative plan for colonoscopy  6) CAD 7) History of  DVT - Holding Xarelto  The indications, risks, and benefits of EGD were explained to the patient and her family member (niece) at bedside in detail. Risks include but are not limited to bleeding, perforation, adverse reaction to medications, and cardiopulmonary compromise. Sequelae include but are not limited to the possibility of surgery, hospitalization, and mortality. The patient verbalized understanding and wished to proceed.    [name] Barbier, DO, FACG [phone] office          Consultation  Referring Provider:  [facility]  Primary Care Physician:  Jones, Thomas L, MD Primary Gastroenterologist:  Dr. Armbruster       Reason for Consultation:    acute on chronic anemia   LOS: 0 days          HPI:   [name] is a 89 y.o. female with past medical history significant for chronic dyspepsia and history of large hiatal hernia, with history of Cameron lesions in short segment Barrett's esophagus, anemia, CAD, dementia, CKDIII, DVT (on Xarelto), presents for acute on chronic anemia.  Patient's niece is present and provides some of the history.  Patient states this morning she woke up feeling weak/dizzy/"woozy" and just overall not herself.  She lives by herself.  Called 911 and transported to the emergency department.  States over the last few weeks she has been feeling dizzy/weak.  States over the last year she has had worsening constipation.  Has dark "stool balls" that are difficult to pass with most bowel movements.  Sometimes has to digitally disimpact.  Will occasionally see bright red blood (very small amount) and feels sometimes her "stool balls" are dark.  Denies iron/Pepto use. Denies weight   loss. Takes colace per PCP for constipation with minimal relief.  States since having her DVT last year she has not been as active.  Niece notes that patient has been sleeping much more in the last 6 months than she had previously.  She reports some nausea and a few episodes of vomiting.  Unsure what vomit looked like.  Upon admission Hgb 5.5, platelets 456.  BUN 14, creatinine 0.88.  Patient has history of anemia and has undergone EGD (see below). Baseline typically 10-11. Denies NSAIDs, alcohol, or tobacco. States last dose of Xarelto was 4/28 PM. EDP did rectal exam, no stool in vault. Negative fecal occult (possibly from poor sample)  GI workup EGD 01/17/2016 - 1cm segment of Barrett's esophagus, no dysplasia, 7cm hiatal hernia, superficial proximal gastric ulceration, ? Cameron lesion, normal remainder of examined stomach and duodenum - biopsies taken to rule out H pylori -  EGD 10/22/2016 - done to evaluate dark emesis, large hiatal hernia, otherwise short segment BE without any pathology noted to cause bleeding EGD 01/07/2019 - 9cm hiatal hernia, along with LA grade C esophagitis, and a C1M2 segment of Barrett's esophagus  Colonoscopy 2009 - diverticulosis Colonoscopy 02/2010 - stercoral ulcers in the rectum, diverticulosis, no polyps CT abdomen 2015 - large hiatal hernia, no mass lesions  Past Medical History:  Diagnosis Date   Abnormal finding on Pap smear, ASCUS    Acute renal insufficiency    Amputation finger    index   Barrett's esophagus    CAD (coronary artery disease)    Dementia (HCC)    Diverticulosis    Elevated BP    Foot deformity    GERD (gastroesophageal reflux disease)    Glaucoma    H/O: GI bleed    HSV-2 (herpes simplex virus 2) infection    Hyperglycemia    Hypertension    Migraine    Osteoporosis    worsening   Spondylosis     Surgical History:  She  has a past surgical history that includes Partial hysterectomy; Amputation finger / thumb; Foot surgery; Upper gastrointestinal endoscopy; Colonoscopy; Esophagogastroduodenoscopy (N/A, 10/22/2016); and Cataract extraction, bilateral (Bilateral, 12/25/2016). Family History:  Her family history includes Alcohol abuse in her son; Breast cancer in her mother; Heart attack in her mother; Heart  disease in her father. Social History:   reports that she has never smoked. She has never used smokeless tobacco. She reports that she does not drink alcohol and does not use drugs.  Prior to Admission medications   Medication Sig Start Date End Date Taking? Authorizing Provider  acetaminophen (TYLENOL) 500 MG tablet Take 500 mg by mouth daily as needed for mild pain or moderate pain.   Yes [provider]  aspirin EC 81 MG tablet Take 81 mg by mouth daily. Swallow whole.   Yes [provider]  FEROSUL 325 (65 Fe) MG tablet TAKE 1 TABLET(325 MG) BY MOUTH DAILY WITH BREAKFAST 06/23/22  Yes Jones, Thomas L, MD  gabapentin (NEURONTIN) 400 MG capsule Take 1 capsule (400 mg total) by mouth at bedtime. 01/27/22  Yes Jones, Thomas L, MD  hydrochlorothiazide (HYDRODIURIL) 25 MG tablet TAKE 1 TABLET(25 MG) BY MOUTH DAILY 04/23/23  Yes Jones, Thomas L, MD  latanoprost (XALATAN) 0.005 % ophthalmic solution INT 1 GTT IN OD QD 08/26/19  Yes [provider]  metoprolol succinate (TOPROL-XL) 25 MG 24 hr tablet Take 1 tablet (25 mg total) by mouth daily. 04/24/22  Yes Burns, Stacy J, MD  Multiple Vitamin (  MULTIVITAMIN) tablet Take 1 tablet by mouth daily.   Yes [provider]  Multiple Vitamins-Minerals (HAIR SKIN & NAILS ADVANCED) TABS Take 1 mg by mouth daily.   Yes [provider]  Olopatadine HCl (PATADAY OP) Apply 1 drop to eye daily.   Yes [provider]  pantoprazole (PROTONIX) 40 MG tablet TAKE 1 TABLET BY MOUTH 1 TO 2 TIMES DAILY 12/06/22  Yes Jones, Thomas L, MD  potassium chloride SA (KLOR-CON M) 20 MEQ tablet TAKE 1 TABLET(20 MEQ) BY MOUTH DAILY 04/17/23  Yes Jones, Thomas L, MD  rivaroxaban (XARELTO) 10 MG TABS tablet Take 1 tablet (10 mg total) by mouth daily. 03/05/23  Yes Jones, Thomas L, MD  traMADol-acetaminophen (ULTRACET) 37.5-325 MG tablet TAKE 1 TABLET BY MOUTH EVERY 6 HOURS AS NEEDED Patient taking differently: Take 1 tablet by mouth daily.  10/07/22  Yes Jones, Thomas L, MD  verapamil (CALAN-SR) 240 MG CR tablet TAKE 1 TABLET(240 MG) BY MOUTH DAILY 01/21/23  Yes Jones, Thomas L, MD  polyethylene glycol powder (GLYCOLAX/MIRALAX) 17 GM/SCOOP powder Take 17 g by mouth daily. 12/15/20   Just, Kelsea J, FNP    Current Facility-Administered Medications  Medication Dose Route Frequency Provider Last Rate Last Admin   0.9 %  sodium chloride infusion (Manually program via Guardrails IV Fluids)   Intravenous Once Melvin, Alexander B, MD       acetaminophen (TYLENOL) tablet 650 mg  650 mg Oral Q6H PRN Melvin, Alexander B, MD       Or   acetaminophen (TYLENOL) suppository 650 mg  650 mg Rectal Q6H PRN Melvin, Alexander B, MD       [START ON 04/27/2023] pantoprazole (PROTONIX) injection 40 mg  40 mg Intravenous Q12H Melvin, Alexander B, MD       pantoprozole (PROTONIX) 80 mg /NS 100 mL infusion  8 mg/hr Intravenous Continuous Melvin, Alexander B, MD       sodium chloride flush (NS) 0.9 % injection 3 mL  3 mL Intravenous Q12H Melvin, Alexander B, MD       Current Outpatient Medications  Medication Sig Dispense Refill   acetaminophen (TYLENOL) 500 MG tablet Take 500 mg by mouth daily as needed for mild pain or moderate pain.     aspirin EC 81 MG tablet Take 81 mg by mouth daily. Swallow whole.     FEROSUL 325 (65 Fe) MG tablet TAKE 1 TABLET(325 MG) BY MOUTH DAILY WITH BREAKFAST 90 tablet 1   gabapentin (NEURONTIN) 400 MG capsule Take 1 capsule (400 mg total) by mouth at bedtime. 90 capsule 1   hydrochlorothiazide (HYDRODIURIL) 25 MG tablet TAKE 1 TABLET(25 MG) BY MOUTH DAILY 90 tablet 0   latanoprost (XALATAN) 0.005 % ophthalmic solution INT 1 GTT IN OD QD     metoprolol succinate (TOPROL-XL) 25 MG 24 hr tablet Take 1 tablet (25 mg total) by mouth daily. 90 tablet 3   Multiple Vitamin (MULTIVITAMIN) tablet Take 1 tablet by mouth daily.     Multiple Vitamins-Minerals (HAIR SKIN & NAILS ADVANCED) TABS Take 1 mg by mouth daily.     Olopatadine HCl  (PATADAY OP) Apply 1 drop to eye daily.     pantoprazole (PROTONIX) 40 MG tablet TAKE 1 TABLET BY MOUTH 1 TO 2 TIMES DAILY 90 tablet 1   potassium chloride SA (KLOR-CON M) 20 MEQ tablet TAKE 1 TABLET(20 MEQ) BY MOUTH DAILY 30 tablet 3   rivaroxaban (XARELTO) 10 MG TABS tablet Take 1 tablet (10 mg total) by   mouth daily. 90 tablet 1   traMADol-acetaminophen (ULTRACET) 37.5-325 MG tablet TAKE 1 TABLET BY MOUTH EVERY 6 HOURS AS NEEDED (Patient taking differently: Take 1 tablet by mouth daily.) 60 tablet 5   verapamil (CALAN-SR) 240 MG CR tablet TAKE 1 TABLET(240 MG) BY MOUTH DAILY 90 tablet 1   polyethylene glycol powder (GLYCOLAX/MIRALAX) 17 GM/SCOOP powder Take 17 g by mouth daily. 3350 g 2    Allergies as of 04/23/2023   (No Known Allergies)    Review of Systems  Constitutional:  Positive for malaise/fatigue. Negative for chills, fever and weight loss.  HENT:  Negative for hearing loss and tinnitus.   Eyes:  Negative for blurred vision and double vision.  Respiratory:  Negative for cough, hemoptysis and shortness of breath.   Cardiovascular:  Negative for chest pain and palpitations.  Gastrointestinal:  Positive for blood in stool (with digital disimpaction) and constipation. Negative for abdominal pain, diarrhea, heartburn, melena, nausea and vomiting.  Genitourinary:  Negative for dysuria and urgency.  Musculoskeletal:  Negative for neck pain.  Skin:  Negative for itching and rash.  Neurological:  Negative for seizures and loss of consciousness.  Psychiatric/Behavioral:  Negative for depression and suicidal ideas.        Physical Exam:  Vital signs in last 24 hours: Temp:  [98.1 F (36.7 C)-98.8 F (37.1 C)] 98.4 F (36.9 C) (04/29 1436) Pulse Rate:  [94-96] 95 (04/29 1436) Resp:  [16-21] 16 (04/29 1436) BP: (115-160)/(50-63) 122/50 (04/29 1436) SpO2:  [100 %] 100 % (04/29 1034) Weight:  [52.6 kg] 52.6 kg (04/29 1040)   Last BM recorded by nurses in past 5 days No data  recorded  Physical Exam Constitutional:      Appearance: She is normal weight. She is ill-appearing.  HENT:     Head: Normocephalic and atraumatic.     Nose: Nose normal. No congestion.     Mouth/Throat:     Mouth: Mucous membranes are moist.     Pharynx: Oropharynx is clear.  Eyes:     Extraocular Movements: Extraocular movements intact.     Comments: Pale conjunctiva  Cardiovascular:     Rate and Rhythm: Normal rate and regular rhythm.  Pulmonary:     Effort: Pulmonary effort is normal. No respiratory distress.  Abdominal:     General: Bowel sounds are normal. There is no distension.     Palpations: There is no mass.     Tenderness: There is no abdominal tenderness. There is no guarding or rebound.     Hernia: No hernia is present.  Musculoskeletal:        General: No tenderness. Normal range of motion.     Cervical back: Normal range of motion and neck supple.  Skin:    General: Skin is warm and dry.     Coloration: Skin is pale.  Neurological:     General: No focal deficit present.     Mental Status: She is oriented to person, place, and time.  Psychiatric:        Mood and Affect: Mood normal.        Behavior: Behavior normal.        Thought Content: Thought content normal.        Judgment: Judgment normal.      LAB RESULTS: Recent Labs    04/23/23 1104 04/23/23 1150  WBC 5.0  --   HGB 5.5* 6.1*  HCT 18.8* 18.0*  PLT 456*  --    BMET Recent Labs      04/23/23 1104 04/23/23 1150  NA 143 142  K 3.6 3.6  CL 106  --   CO2 28  --   GLUCOSE 107*  --   BUN 14  --   CREATININE 0.88  --   CALCIUM 8.5*  --    LFT Recent Labs    04/23/23 1104  PROT 5.6*  ALBUMIN 2.8*  AST 15  ALT 13  ALKPHOS 44  BILITOT 0.4   PT/INR Recent Labs    04/23/23 1225  LABPROT 15.6*  INR 1.3*    STUDIES: CT Head Wo Contrast  Result Date: 04/23/2023 CLINICAL DATA:  Altered mental status EXAM: CT HEAD WITHOUT CONTRAST TECHNIQUE: Contiguous axial images were obtained  from the base of the skull through the vertex without intravenous contrast. RADIATION DOSE REDUCTION: This exam was performed according to the departmental dose-optimization program which includes automated exposure control, adjustment of the mA and/or kV according to patient size and/or use of iterative reconstruction technique. COMPARISON:  CT head 06/15/10 FINDINGS: Brain: No evidence of acute infarction, hemorrhage, hydrocephalus, extra-axial collection or mass lesion/mass effect. Sequela of mild chronic microvascular ischemic change. Vascular: No hyperdense vessel or unexpected calcification. Skull: Normal. Negative for fracture or focal lesion. Sinuses/Orbits: No middle ear or mastoid effusion. Paranasal sinuses are clear. Bilateral lens replacement. Orbits are otherwise unremarkable. Other: None. IMPRESSION: No acute intracranial abnormality. Electronically Signed   By: Hemant  Desai M.D.   On: 04/23/2023 12:24   DG Chest Portable 1 View  Result Date: 04/23/2023 CLINICAL DATA:  Dizziness.  Infection EXAM: PORTABLE CHEST 1 VIEW COMPARISON:  X-ray 08/07/2022 FINDINGS: Film is rotated to the right. Tortuous and ectatic aorta. Normal cardiopericardial silhouette. Presumed hiatal hernia. No consolidation, pneumothorax or effusion. No edema. Overlapping cardiac leads. Degenerative changes seen of the shoulders. IMPRESSION: Rotated radiograph.  Hiatal hernia. No consolidation or pneumothorax. Electronically Signed   By: Ashok  Gupta M.D.   On: 04/23/2023 11:30      Impression    Symptomatic anemia - hgb 5.5 (10.2 one month prior) - BUN 14, Cr. 0.88 Fecal occult negative - possibly from poor sample. History of 9cm hiatal hernia on last EGD in 2020, minimal lower GI bleeding mainly with trauma from disimpaction due to constipation. Chronic anemia from hiatal hernia and history of cameron lesions on previous EGDs. Acute blood loss possibly from cameron lesions, will evaluate.  Constipation  - possibly  worsened from being sedentary since DVT. Longstanding history with stercoral ulcers on last colonoscopy 2011.  DVT - last dose Xarelto 4/28 PM  CAD Hypertension Dementia - alert and oriented to place, time, person.   Plan   Plan for EGD tomorrow. I thoroughly discussed the procedures to include nature, alternatives, benefits, and risks including but not limited to bleeding, perforation, infection, anesthesia/cardiac and pulmonary complications. Patient provides understanding and gave verbal consent to proceed. Continue Protonix IV 40mg BID Continue clear liquid diet NPO at midnight Continue daily CBC and transfuse as needed to maintain HGB > 7   Miralax 17g BID Hold Xarelto  Thank you for your kind consultation, we will continue to follow.   Bayley M McMichael  04/23/2023, 3:04 PM  

## 2023-04-23 NOTE — ED Triage Notes (Signed)
Per [facility] pt coming from home having dizziness this morning. According to niece pt was more confused then her usual this morning. Patient alert to self and place. Patient unsure of why she was brought here this morning states when she woke up this morning she was "feeling crazy."

## 2023-04-23 NOTE — ED Notes (Signed)
Patient assisted to [facility].

## 2023-04-23 NOTE — ED Notes (Signed)
Dr.Branham made aware of pt Istat VBG results. ED-Lab

## 2023-04-23 NOTE — ED Notes (Signed)
ED TO INPATIENT HANDOFF REPORT  ED Nurse Name and Phone #:  [phone] [name] Name/Age/Gender [name] 89 y.o. female Room/Bed: 039C/039C  Code Status   Code Status: Full Code  Home/SNF/Other Home Patient oriented to: self, place, and situation Is this baseline? Yes   Triage Complete: Triage complete  Chief Complaint Symptomatic anemia [D64.9]  Triage Note Per [facility] pt coming from home having dizziness this morning. According to niece pt was more confused then her usual this morning. Patient alert to self and place. Patient unsure of why she was brought here this morning states when she woke up this morning she was "feeling crazy."    Allergies No Known Allergies  Level of Care/Admitting Diagnosis ED Disposition     ED Disposition  Admit   Condition  --   Comment  Hospital Area: [facility] [[id number]]  Level of Care: Progressive [102]  Admit to Progressive based on following criteria: MULTISYSTEM THREATS such as stable sepsis, metabolic/electrolyte imbalance with or without encephalopathy that is responding to early treatment.  Admit to Progressive based on following criteria: Other see comments  Comments: Normocytic, symptomatic anemia drop from 10-5.5 in 1 month, known source of bleeding  May place patient in observation at [facility] or [facility] if equivalent level of care is available:: No  Covid Evaluation: Asymptomatic - no recent exposure (last 10 days) testing not required  Diagnosis: Symptomatic anemia [9147829]  Admitting Physician: Synetta Fail [[id number]]  Attending Physician: Synetta Fail [phone]          B Medical/Surgery History Past Medical History:  Diagnosis Date   Abnormal finding on Pap smear, ASCUS    Acute renal insufficiency    Amputation finger    index   Barrett's esophagus    CAD (coronary artery disease)    Dementia (HCC)    Diverticulosis    Elevated BP    Foot deformity    GERD  (gastroesophageal reflux disease)    Glaucoma    H/O: GI bleed    HSV-2 (herpes simplex virus 2) infection    Hyperglycemia    Hypertension    Migraine    Osteoporosis    worsening   Spondylosis    Past Surgical History:  Procedure Laterality Date   AMPUTATION FINGER / THUMB     secondary to osteomyelitis   CATARACT EXTRACTION, BILATERAL Bilateral 12/25/2016   COLONOSCOPY     ESOPHAGOGASTRODUODENOSCOPY N/A 10/22/2016   Procedure: ESOPHAGOGASTRODUODENOSCOPY (EGD);  Surgeon: Sherrilyn Rist, MD;  Location: [facility] ENDOSCOPY;  Service: Endoscopy;  Laterality: N/A;   FOOT SURGERY     right   PARTIAL HYSTERECTOMY     UPPER GASTROINTESTINAL ENDOSCOPY       A IV Location/Drains/Wounds Patient Lines/Drains/Airways Status     Active Line/Drains/Airways     Name Placement date Placement time Site Days   Peripheral IV 04/23/23 20 G Right Antecubital 04/23/23  1138  Antecubital  less than 1   Peripheral IV 04/23/23 22 G Anterior;Left Forearm 04/23/23  1227  Forearm  less than 1            Intake/Output Last 24 hours  Intake/Output Summary (Last 24 hours) at 04/23/2023 1742 Last data filed at 04/23/2023 1710 Gross per 24 hour  Intake 825 ml  Output --  Net 825 ml    Labs/Imaging Results for orders placed or performed during the hospital encounter of 04/23/23 (from the past 48 hour(s))  Comprehensive metabolic panel  Status: Abnormal   Collection Time: 04/23/23 11:04 AM  Result Value Ref Range   Sodium 143 135 - 145 mmol/L   Potassium 3.6 3.5 - 5.1 mmol/L   Chloride 106 98 - 111 mmol/L   CO2 28 22 - 32 mmol/L   Glucose, Bld 107 (H) 70 - 99 mg/dL    Comment: Glucose reference range applies only to samples taken after fasting for at least 8 hours.   BUN 14 8 - 23 mg/dL   Creatinine, Ser 1.61 0.44 - 1.00 mg/dL   Calcium 8.5 (L) 8.9 - 10.3 mg/dL   Total Protein 5.6 (L) 6.5 - 8.1 g/dL   Albumin 2.8 (L) 3.5 - 5.0 g/dL   AST 15 15 - 41 U/L   ALT 13 0 - 44 U/L   Alkaline  Phosphatase 44 38 - 126 U/L   Total Bilirubin 0.4 0.3 - 1.2 mg/dL   GFR, Estimated >09 >60 mL/min    Comment: (NOTE) Calculated using the CKD-EPI Creatinine Equation (2021)    Anion gap 9 5 - 15    Comment: Performed at [facility], [address], [place], [place] [address]  CBC with Differential     Status: Abnormal   Collection Time: 04/23/23 11:04 AM  Result Value Ref Range   WBC 5.0 4.0 - 10.5 K/uL   RBC 2.03 (L) 3.87 - 5.11 MIL/uL   Hemoglobin 5.5 (LL) 12.0 - 15.0 g/dL    Comment: REPEATED TO VERIFY THIS CRITICAL RESULT HAS VERIFIED AND BEEN CALLED TO C. KOURI, RN BY JULIE MACEDA DEL ANGEL ON 04 29 2024 AT 1232, AND HAS BEEN READ BACK.     HCT 18.8 (L) 36.0 - 46.0 %   MCV 92.6 80.0 - 100.0 fL   MCH 27.1 26.0 - 34.0 pg   MCHC 29.3 (L) 30.0 - 36.0 g/dL   RDW 81.1 (H) 91.4 - 78.2 %   Platelets 456 (H) 150 - 400 K/uL   nRBC 0.0 0.0 - 0.2 %   Neutrophils Relative % 75 %   Neutro Abs 3.8 1.7 - 7.7 K/uL   Lymphocytes Relative 9 %   Lymphs Abs 0.4 (L) 0.7 - 4.0 K/uL   Monocytes Relative 11 %   Monocytes Absolute 0.5 0.1 - 1.0 K/uL   Eosinophils Relative 4 %   Eosinophils Absolute 0.2 0.0 - 0.5 K/uL   Basophils Relative 1 %   Basophils Absolute 0.0 0.0 - 0.1 K/uL   Immature Granulocytes 0 %   Abs Immature Granulocytes 0.02 0.00 - 0.07 K/uL    Comment: Performed at [facility], [address], [place], [place] [address]  Ethanol     Status: None   Collection Time: 04/23/23 11:04 AM  Result Value Ref Range   Alcohol, Ethyl (B) <10 <10 mg/dL    Comment: (NOTE) Lowest detectable limit for serum alcohol is 10 mg/dL.  For medical purposes only. Performed at [facility], [address] [place], [place] [address]   I-Stat venous blood gas, ED     Status: Abnormal   Collection Time: 04/23/23 11:50 AM  Result Value Ref Range   pH, Ven 7.461 (H) 7.25 - 7.43   pCO2, Ven 38.1 (L) 44 - 60 mmHg   pO2, Ven 51 (H) 32 - 45 mmHg   Bicarbonate 27.2 20.0 - 28.0  mmol/L   TCO2 28 22 - 32 mmol/L   O2 Saturation 88 %   Acid-Base Excess 3.0 (H) 0.0 -  2.0 mmol/L   Sodium 142 135 - 145 mmol/L   Potassium 3.6 3.5 - 5.1 mmol/L   Calcium, Ion 1.15 1.15 - 1.40 mmol/L   HCT 18.0 (L) 36.0 - 46.0 %   Hemoglobin 6.1 (LL) 12.0 - 15.0 g/dL   Sample type VENOUS    Comment NOTIFIED PHYSICIAN   Type and screen [facility]     Status: None (Preliminary result)   Collection Time: 04/23/23 12:25 PM  Result Value Ref Range   ABO/RH(D) O POS    Antibody Screen NEG    Sample Expiration 04/26/2023,2359    Unit Number [id number]    Blood Component Type RED CELLS,LR    Unit division 00    Status of Unit ISSUED    Transfusion Status OK TO TRANSFUSE    Crossmatch Result      Compatible Performed at [facility], [address], [place], [place] [address]    Unit Number [id number]    Blood Component Type RED CELLS,LR    Unit division 00    Status of Unit ISSUED    Transfusion Status OK TO TRANSFUSE    Crossmatch Result Compatible   Protime-INR     Status: Abnormal   Collection Time: 04/23/23 12:25 PM  Result Value Ref Range   Prothrombin Time 15.6 (H) 11.4 - 15.2 seconds   INR 1.3 (H) 0.8 - 1.2    Comment: (NOTE) INR goal varies based on device and disease states. Performed at [facility], [address] [place], [place] [address]   APTT     Status: None   Collection Time: 04/23/23 12:25 PM  Result Value Ref Range   aPTT 30 24 - 36 seconds    Comment: Performed at [facility], [address], [place], [place] [address]  Prepare RBC (crossmatch)     Status: None   Collection Time: 04/23/23 12:25 PM  Result Value Ref Range   Order Confirmation      ORDER PROCESSED BY BLOOD BANK Performed at [facility], [address] [place], [place] [address]   POC occult blood, ED     Status: None   Collection Time: 04/23/23 12:45 PM  Result Value Ref Range   Fecal Occult Bld NEGATIVE NEGATIVE   CT Head Wo  Contrast  Result Date: 04/23/2023 CLINICAL DATA:  Altered mental status EXAM: CT HEAD WITHOUT CONTRAST TECHNIQUE: Contiguous axial images were obtained from the base of the skull through the vertex without intravenous contrast. RADIATION DOSE REDUCTION: This exam was performed according to the departmental dose-optimization program which includes automated exposure control, adjustment of the mA and/or kV according to patient size and/or use of iterative reconstruction technique. COMPARISON:  CT head 06/15/10 FINDINGS: Brain: No evidence of acute infarction, hemorrhage, hydrocephalus, extra-axial collection or mass lesion/mass effect. Sequela of mild chronic microvascular ischemic change. Vascular: No hyperdense vessel or unexpected calcification. Skull: Normal. Negative for fracture or focal lesion. Sinuses/Orbits: No middle ear or mastoid effusion. Paranasal sinuses are clear. Bilateral lens replacement. Orbits are otherwise unremarkable. Other: None. IMPRESSION: No acute intracranial abnormality. Electronically Signed   By: Lorenza Cambridge M.D.   On: 04/23/2023 12:24   DG Chest Portable 1 View  Result Date: 04/23/2023 CLINICAL DATA:  Dizziness.  Infection EXAM: PORTABLE CHEST 1 VIEW COMPARISON:  X-ray 08/07/2022 FINDINGS: Film is rotated to the right. Tortuous and ectatic aorta. Normal cardiopericardial silhouette. Presumed hiatal hernia. No consolidation, pneumothorax or effusion. No edema. Overlapping  cardiac leads. Degenerative changes seen of the shoulders. IMPRESSION: Rotated radiograph.  Hiatal hernia. No consolidation or pneumothorax. Electronically Signed   By: Karen Kays M.D.   On: 04/23/2023 11:30    Pending Labs Unresulted Labs (From admission, onward)     Start     Ordered   04/24/23 0500  Basic metabolic panel  Tomorrow morning,   R        04/23/23 1404   04/24/23 0500  CBC  Tomorrow morning,   R        04/23/23 1404   04/23/23 1104  Urinalysis, Routine w reflex microscopic -Urine,  Clean Catch  Once,   URGENT       Question:  Specimen Source  Answer:  Urine, Clean Catch   04/23/23 1104            Vitals/Pain Today's Vitals   04/23/23 1530 04/23/23 1621 04/23/23 1646 04/23/23 1659  BP: (!) 152/78 138/64 126/65 137/62  Pulse: 99 96 94 92  Resp: (!) 28 19 20  (!) 22  Temp:  98.7 F (37.1 C) 98.3 F (36.8 C) 98.2 F (36.8 C)  TempSrc:  Oral Oral Oral  SpO2: 100%     Weight:      Height:      PainSc:        Isolation Precautions No active isolations  Medications Medications  sodium chloride flush (NS) 0.9 % injection 3 mL (3 mLs Intravenous Given 04/23/23 1709)  acetaminophen (TYLENOL) tablet 650 mg (has no administration in time range)    Or  acetaminophen (TYLENOL) suppository 650 mg (has no administration in time range)  pantoprozole (PROTONIX) 80 mg /NS 100 mL infusion (8 mg/hr Intravenous New Bag/Given 04/23/23 1708)  pantoprazole (PROTONIX) injection 40 mg (has no administration in time range)  polyethylene glycol (MIRALAX / GLYCOLAX) packet 17 g (has no administration in time range)  sodium chloride 0.9 % bolus 500 mL (0 mLs Intravenous Stopped 04/23/23 1244)  pantoprazole (PROTONIX) injection 40 mg (40 mg Intravenous Given 04/23/23 1234)  0.9 %  sodium chloride infusion (Manually program via Guardrails IV Fluids) (0 mLs Intravenous Stopped 04/23/23 1710)    Mobility walks with device     Focused Assessments     R Recommendations: See Admitting Provider Note  Report given to:   Additional Notes:

## 2023-04-23 NOTE — Consult Note (Addendum)
Attending physician's note   I have taken a history, reviewed the chart, and examined the patient. I performed a substantive portion of this encounter, including complete performance of at least one of the key components, in conjunction with the APP. I agree with the APP's note, impression, and recommendations with my edits.   89 year old female with medical history as outlined below, presents with acute on chronic anemia and dizziness.  Does have a history of constipation which requires regular manual disimpaction, and will occasionally see BRB.  Otherwise denies melena.  Occasional nausea without emesis.  Does have a history of GERD with erosive esophagitis, a large 9 cm hiatal hernia, and short segment nondysplastic Barrett's Esophagus as outlined below.  Admission evaluation notable for the following: - H/H 5.5/18.8 (baseline Hgb ~10-11) with MCV 92 (baseline ~84) - Albumin 2.8, otherwise normal CMP -INR 1.3 - FOBT negative - CXR: Hiatal hernia - CT head: Negative for acute intracranial pathology  1) Symptomatic anemia Discussed DDx for possible GI bleed with patient and family member at bedside, to include possible Cameron's ulcer (large HH), PUD, gastritis, AVMs, etc.  Plan to start with EGD for diagnostic and therapeutic intent, and if unrevealing, can discuss role/utility of colonoscopy and/or VCE - Transfusing today - Plan for EGD tomorrow for diagnostic and therapeutic intent - Clears okay today with n.p.o. at midnight - High-dose PPI as outlined - Check iron panel, B12, folate  2) Hiatal hernia 3) GERD with erosive esophagitis 4) Short segment nondysplastic Barrett's Esophagus - Continue PPI as above - EGD as above  5) Constipation - Does have history of stercoral ulcer on colonoscopy in 2011.  Describes minimal, scant BRB following episodic manual disimpaction - Increase bowel regimen - If EGD unrevealing, tentative plan for colonoscopy  6) CAD 7) History of  DVT - Holding Xarelto  The indications, risks, and benefits of EGD were explained to the patient and her family member (niece) at bedside in detail. Risks include but are not limited to bleeding, perforation, adverse reaction to medications, and cardiopulmonary compromise. Sequelae include but are not limited to the possibility of surgery, hospitalization, and mortality. The patient verbalized understanding and wished to proceed.    [address] DO, [place] [phone] office          Consultation  Referring Provider:  [facility]  Primary Care Physician:  Etta Grandchild, MD Primary Gastroenterologist:  Dr. Adela Lank       Reason for Consultation:    acute on chronic anemia   LOS: 0 days          HPI:   [name] is a 89 y.o. female with past medical history significant for chronic dyspepsia and history of large hiatal hernia, with history of Sheria Lang lesions in short segment Barrett's esophagus, anemia, CAD, dementia, CKDIII, DVT (on Xarelto), presents for acute on chronic anemia.  Patient's niece is present and provides some of the history.  Patient states this morning she woke up feeling weak/dizzy/"woozy" and just overall not herself.  She lives by herself.  Called 911 and transported to the emergency department.  States over the last few weeks she has been feeling dizzy/weak.  States over the last year she has had worsening constipation.  Has dark "stool balls" that are difficult to pass with most bowel movements.  Sometimes has to digitally disimpact.  Will occasionally see bright red blood (very small amount) and feels sometimes her "stool balls" are dark.  Denies iron/Pepto use. Denies weight  loss. Takes colace per PCP for constipation with minimal relief.  States since having her DVT last year she has not been as active.  Niece notes that patient has been sleeping much more in the last 6 months than she had previously.  She reports some nausea and a few episodes of vomiting.  Unsure what vomit looked like.  Upon admission Hgb 5.5, platelets 456.  BUN 14, creatinine 0.88.  Patient has history of anemia and has undergone EGD (see below). Baseline typically 10-11. Denies NSAIDs, alcohol, or tobacco. States last dose of Xarelto was 4/28 PM. EDP did rectal exam, no stool in vault. Negative fecal occult (possibly from poor sample)  GI workup EGD 01/17/2016 - 1cm segment of Barrett's esophagus, no dysplasia, 7cm hiatal hernia, superficial proximal gastric ulceration, ? Sheria Lang lesion, normal remainder of examined stomach and duodenum - biopsies taken to rule out H pylori -  EGD 10/22/2016 - done to evaluate dark emesis, large hiatal hernia, otherwise short segment BE without any pathology noted to cause bleeding EGD 01/07/2019 - 9cm hiatal hernia, along with LA grade C esophagitis, and a C1M2 segment of Barrett's esophagus  Colonoscopy 2009 - diverticulosis Colonoscopy 02/2010 - stercoral ulcers in the rectum, diverticulosis, no polyps CT abdomen 2015 - large hiatal hernia, no mass lesions  Past Medical History:  Diagnosis Date   Abnormal finding on Pap smear, ASCUS    Acute renal insufficiency    Amputation finger    index   Barrett's esophagus    CAD (coronary artery disease)    Dementia (HCC)    Diverticulosis    Elevated BP    Foot deformity    GERD (gastroesophageal reflux disease)    Glaucoma    H/O: GI bleed    HSV-2 (herpes simplex virus 2) infection    Hyperglycemia    Hypertension    Migraine    Osteoporosis    worsening   Spondylosis     Surgical History:  She  has a past surgical history that includes Partial hysterectomy; Amputation finger / thumb; Foot surgery; Upper gastrointestinal endoscopy; Colonoscopy; Esophagogastroduodenoscopy (N/A, 10/22/2016); and Cataract extraction, bilateral (Bilateral, 12/25/2016). Family History:  Her family history includes Alcohol abuse in her son; Breast cancer in her mother; Heart attack in her mother; Heart  disease in her father. Social History:   reports that she has never smoked. She has never used smokeless tobacco. She reports that she does not drink alcohol and does not use drugs.  Prior to Admission medications   Medication Sig Start Date End Date Taking? Authorizing Provider  acetaminophen (TYLENOL) 500 MG tablet Take 500 mg by mouth daily as needed for mild pain or moderate pain.   Yes [provider]  aspirin EC 81 MG tablet Take 81 mg by mouth daily. Swallow whole.   Yes [provider]  FEROSUL 325 (65 Fe) MG tablet TAKE 1 TABLET(325 MG) BY MOUTH DAILY WITH BREAKFAST 06/23/22  Yes Etta Grandchild, MD  gabapentin (NEURONTIN) 400 MG capsule Take 1 capsule (400 mg total) by mouth at bedtime. 01/27/22  Yes Etta Grandchild, MD  hydrochlorothiazide (HYDRODIURIL) 25 MG tablet TAKE 1 TABLET(25 MG) BY MOUTH DAILY 04/23/23  Yes Etta Grandchild, MD  latanoprost (XALATAN) 0.005 % ophthalmic solution INT 1 GTT IN OD QD 08/26/19  Yes [provider]  metoprolol succinate (TOPROL-XL) 25 MG 24 hr tablet Take 1 tablet (25 mg total) by mouth daily. 04/24/22  Yes Pincus Sanes, MD  Multiple Vitamin (  MULTIVITAMIN) tablet Take 1 tablet by mouth daily.   Yes [provider]  Multiple Vitamins-Minerals (HAIR SKIN & NAILS ADVANCED) TABS Take 1 mg by mouth daily.   Yes [provider]  Olopatadine HCl (PATADAY OP) Apply 1 drop to eye daily.   Yes [provider]  pantoprazole (PROTONIX) 40 MG tablet TAKE 1 TABLET BY MOUTH 1 TO 2 TIMES DAILY 12/06/22  Yes Etta Grandchild, MD  potassium chloride SA (KLOR-CON M) 20 MEQ tablet TAKE 1 TABLET(20 MEQ) BY MOUTH DAILY 04/17/23  Yes Etta Grandchild, MD  rivaroxaban (XARELTO) 10 MG TABS tablet Take 1 tablet (10 mg total) by mouth daily. 03/05/23  Yes Etta Grandchild, MD  traMADol-acetaminophen (ULTRACET) 37.5-325 MG tablet TAKE 1 TABLET BY MOUTH EVERY 6 HOURS AS NEEDED Patient taking differently: Take 1 tablet by mouth daily.  10/07/22  Yes Etta Grandchild, MD  verapamil (CALAN-SR) 240 MG CR tablet TAKE 1 TABLET(240 MG) BY MOUTH DAILY 01/21/23  Yes Etta Grandchild, MD  polyethylene glycol powder (GLYCOLAX/MIRALAX) 17 GM/SCOOP powder Take 17 g by mouth daily. 12/15/20   Just, Azalee Course, FNP    Current Facility-Administered Medications  Medication Dose Route Frequency Provider Last Rate Last Admin   0.9 %  sodium chloride infusion (Manually program via Guardrails IV Fluids)   Intravenous Once Synetta Fail, MD       acetaminophen (TYLENOL) tablet 650 mg  650 mg Oral Q6H PRN Synetta Fail, MD       Or   acetaminophen (TYLENOL) suppository 650 mg  650 mg Rectal Q6H PRN Synetta Fail, MD       [START ON 04/27/2023] pantoprazole (PROTONIX) injection 40 mg  40 mg Intravenous Q12H Synetta Fail, MD       pantoprozole (PROTONIX) 80 mg /NS 100 mL infusion  8 mg/hr Intravenous Continuous Synetta Fail, MD       sodium chloride flush (NS) 0.9 % injection 3 mL  3 mL Intravenous Q12H Synetta Fail, MD       Current Outpatient Medications  Medication Sig Dispense Refill   acetaminophen (TYLENOL) 500 MG tablet Take 500 mg by mouth daily as needed for mild pain or moderate pain.     aspirin EC 81 MG tablet Take 81 mg by mouth daily. Swallow whole.     FEROSUL 325 (65 Fe) MG tablet TAKE 1 TABLET(325 MG) BY MOUTH DAILY WITH BREAKFAST 90 tablet 1   gabapentin (NEURONTIN) 400 MG capsule Take 1 capsule (400 mg total) by mouth at bedtime. 90 capsule 1   hydrochlorothiazide (HYDRODIURIL) 25 MG tablet TAKE 1 TABLET(25 MG) BY MOUTH DAILY 90 tablet 0   latanoprost (XALATAN) 0.005 % ophthalmic solution INT 1 GTT IN OD QD     metoprolol succinate (TOPROL-XL) 25 MG 24 hr tablet Take 1 tablet (25 mg total) by mouth daily. 90 tablet 3   Multiple Vitamin (MULTIVITAMIN) tablet Take 1 tablet by mouth daily.     Multiple Vitamins-Minerals (HAIR SKIN & NAILS ADVANCED) TABS Take 1 mg by mouth daily.     Olopatadine HCl  (PATADAY OP) Apply 1 drop to eye daily.     pantoprazole (PROTONIX) 40 MG tablet TAKE 1 TABLET BY MOUTH 1 TO 2 TIMES DAILY 90 tablet 1   potassium chloride SA (KLOR-CON M) 20 MEQ tablet TAKE 1 TABLET(20 MEQ) BY MOUTH DAILY 30 tablet 3   rivaroxaban (XARELTO) 10 MG TABS tablet Take 1 tablet (10 mg total) by  mouth daily. 90 tablet 1   traMADol-acetaminophen (ULTRACET) 37.5-325 MG tablet TAKE 1 TABLET BY MOUTH EVERY 6 HOURS AS NEEDED (Patient taking differently: Take 1 tablet by mouth daily.) 60 tablet 5   verapamil (CALAN-SR) 240 MG CR tablet TAKE 1 TABLET(240 MG) BY MOUTH DAILY 90 tablet 1   polyethylene glycol powder (GLYCOLAX/MIRALAX) 17 GM/SCOOP powder Take 17 g by mouth daily. 3350 g 2    Allergies as of 04/23/2023   (No Known Allergies)    Review of Systems  Constitutional:  Positive for malaise/fatigue. Negative for chills, fever and weight loss.  HENT:  Negative for hearing loss and tinnitus.   Eyes:  Negative for blurred vision and double vision.  Respiratory:  Negative for cough, hemoptysis and shortness of breath.   Cardiovascular:  Negative for chest pain and palpitations.  Gastrointestinal:  Positive for blood in stool (with digital disimpaction) and constipation. Negative for abdominal pain, diarrhea, heartburn, melena, nausea and vomiting.  Genitourinary:  Negative for dysuria and urgency.  Musculoskeletal:  Negative for neck pain.  Skin:  Negative for itching and rash.  Neurological:  Negative for seizures and loss of consciousness.  Psychiatric/Behavioral:  Negative for depression and suicidal ideas.        Physical Exam:  Vital signs in last 24 hours: Temp:  [98.1 F (36.7 C)-98.8 F (37.1 C)] 98.4 F (36.9 C) (04/29 1436) Pulse Rate:  [94-96] 95 (04/29 1436) Resp:  [16-21] 16 (04/29 1436) BP: (115-160)/(50-63) 122/50 (04/29 1436) SpO2:  [100 %] 100 % (04/29 1034) Weight:  [52.6 kg] 52.6 kg (04/29 1040)   Last BM recorded by nurses in past 5 days No data  recorded  Physical Exam Constitutional:      Appearance: She is normal weight. She is ill-appearing.  HENT:     Head: Normocephalic and atraumatic.     Nose: Nose normal. No congestion.     Mouth/Throat:     Mouth: Mucous membranes are moist.     Pharynx: Oropharynx is clear.  Eyes:     Extraocular Movements: Extraocular movements intact.     Comments: Pale conjunctiva  Cardiovascular:     Rate and Rhythm: Normal rate and regular rhythm.  Pulmonary:     Effort: Pulmonary effort is normal. No respiratory distress.  Abdominal:     General: Bowel sounds are normal. There is no distension.     Palpations: There is no mass.     Tenderness: There is no abdominal tenderness. There is no guarding or rebound.     Hernia: No hernia is present.  Musculoskeletal:        General: No tenderness. Normal range of motion.     Cervical back: Normal range of motion and neck supple.  Skin:    General: Skin is warm and dry.     Coloration: Skin is pale.  Neurological:     General: No focal deficit present.     Mental Status: She is oriented to person, place, and time.  Psychiatric:        Mood and Affect: Mood normal.        Behavior: Behavior normal.        Thought Content: Thought content normal.        Judgment: Judgment normal.      LAB RESULTS: Recent Labs    04/23/23 1104 04/23/23 1150  WBC 5.0  --   HGB 5.5* 6.1*  HCT 18.8* 18.0*  PLT 456*  --    BMET Recent Labs  04/23/23 1104 04/23/23 1150  NA 143 142  K 3.6 3.6  CL 106  --   CO2 28  --   GLUCOSE 107*  --   BUN 14  --   CREATININE 0.88  --   CALCIUM 8.5*  --    LFT Recent Labs    04/23/23 1104  PROT 5.6*  ALBUMIN 2.8*  AST 15  ALT 13  ALKPHOS 44  BILITOT 0.4   PT/INR Recent Labs    04/23/23 1225  LABPROT 15.6*  INR 1.3*    STUDIES: CT Head Wo Contrast  Result Date: 04/23/2023 CLINICAL DATA:  Altered mental status EXAM: CT HEAD WITHOUT CONTRAST TECHNIQUE: Contiguous axial images were obtained  from the base of the skull through the vertex without intravenous contrast. RADIATION DOSE REDUCTION: This exam was performed according to the departmental dose-optimization program which includes automated exposure control, adjustment of the mA and/or kV according to patient size and/or use of iterative reconstruction technique. COMPARISON:  CT head 06/15/10 FINDINGS: Brain: No evidence of acute infarction, hemorrhage, hydrocephalus, extra-axial collection or mass lesion/mass effect. Sequela of mild chronic microvascular ischemic change. Vascular: No hyperdense vessel or unexpected calcification. Skull: Normal. Negative for fracture or focal lesion. Sinuses/Orbits: No middle ear or mastoid effusion. Paranasal sinuses are clear. Bilateral lens replacement. Orbits are otherwise unremarkable. Other: None. IMPRESSION: No acute intracranial abnormality. Electronically Signed   By: Lorenza Cambridge M.D.   On: 04/23/2023 12:24   DG Chest Portable 1 View  Result Date: 04/23/2023 CLINICAL DATA:  Dizziness.  Infection EXAM: PORTABLE CHEST 1 VIEW COMPARISON:  X-ray 08/07/2022 FINDINGS: Film is rotated to the right. Tortuous and ectatic aorta. Normal cardiopericardial silhouette. Presumed hiatal hernia. No consolidation, pneumothorax or effusion. No edema. Overlapping cardiac leads. Degenerative changes seen of the shoulders. IMPRESSION: Rotated radiograph.  Hiatal hernia. No consolidation or pneumothorax. Electronically Signed   By: Karen Kays M.D.   On: 04/23/2023 11:30      Impression    Symptomatic anemia - hgb 5.5 (10.2 one month prior) - BUN 14, Cr. 0.88 Fecal occult negative - possibly from poor sample. History of 9cm hiatal hernia on last EGD in 2020, minimal lower GI bleeding mainly with trauma from disimpaction due to constipation. Chronic anemia from hiatal hernia and history of cameron lesions on previous EGDs. Acute blood loss possibly from cameron lesions, will evaluate.  Constipation  - possibly  worsened from being sedentary since DVT. Longstanding history with stercoral ulcers on last colonoscopy 2011.  DVT - last dose Xarelto 4/28 PM  CAD Hypertension Dementia - alert and oriented to place, time, person.   Plan   Plan for EGD tomorrow. I thoroughly discussed the procedures to include nature, alternatives, benefits, and risks including but not limited to bleeding, perforation, infection, anesthesia/cardiac and pulmonary complications. Patient provides understanding and gave verbal consent to proceed. Continue Protonix IV 40mg  BID Continue clear liquid diet NPO at midnight Continue daily CBC and transfuse as needed to maintain HGB > 7   Miralax 17g BID Hold Xarelto  Thank you for your kind consultation, we will continue to follow.   Bayley Leanna Sato  04/23/2023, 3:04 PM

## 2023-04-23 NOTE — ED Provider Notes (Signed)
[place] EMERGENCY DEPARTMENT AT [facility] Provider Note   CSN: [id number] Arrival date & time: 04/23/23  1027     History  Chief Complaint  Patient presents with   Altered Mental Status    [name] is a 89 y.o. female.  With PMH of HTN, HLD, CKD, dementia who was brought in from home for increased confusion from baseline per niece.  Niece is not present at bedside.  Patient is historian.  She has underlying dementia and is not best historian however still able to answer questions and talkative on exam.  Patient says she woke up today and she lives alone and was feeling unwell.  She felt lightheaded upon standing and scared about being alone so she called her niece who called the ambulance to bring her here.  She notes having vomiting after every time she eats but is denying any active chest pain or abdominal pain.  She has bowel movements but notes they are small pellet-like.  She denies any recent falls or fevers or chills or pain with urination.  She feels more safe and better now that she is in the ER around other people.   Altered Mental Status      Home Medications Prior to Admission medications   Medication Sig Start Date End Date Taking? Authorizing Provider  acetaminophen (TYLENOL) 500 MG tablet Take 500 mg by mouth daily as needed for mild pain or moderate pain.   Yes [provider]  aspirin EC 81 MG tablet Take 81 mg by mouth daily. Swallow whole.   Yes [provider]  FEROSUL 325 (65 Fe) MG tablet TAKE 1 TABLET(325 MG) BY MOUTH DAILY WITH BREAKFAST 06/23/22  Yes Etta Grandchild, MD  gabapentin (NEURONTIN) 400 MG capsule Take 1 capsule (400 mg total) by mouth at bedtime. 01/27/22  Yes Etta Grandchild, MD  hydrochlorothiazide (HYDRODIURIL) 25 MG tablet TAKE 1 TABLET(25 MG) BY MOUTH DAILY 04/23/23  Yes Etta Grandchild, MD  latanoprost (XALATAN) 0.005 % ophthalmic solution INT 1 GTT IN OD QD 08/26/19  Yes [provider]  metoprolol  succinate (TOPROL-XL) 25 MG 24 hr tablet Take 1 tablet (25 mg total) by mouth daily. 04/24/22  Yes Burns, Bobette Mo, MD  Multiple Vitamin (MULTIVITAMIN) tablet Take 1 tablet by mouth daily.   Yes [provider]  Multiple Vitamins-Minerals (HAIR SKIN & NAILS ADVANCED) TABS Take 1 mg by mouth daily.   Yes [provider]  Olopatadine HCl (PATADAY OP) Apply 1 drop to eye daily.   Yes [provider]  pantoprazole (PROTONIX) 40 MG tablet TAKE 1 TABLET BY MOUTH 1 TO 2 TIMES DAILY 12/06/22  Yes Etta Grandchild, MD  potassium chloride SA (KLOR-CON M) 20 MEQ tablet TAKE 1 TABLET(20 MEQ) BY MOUTH DAILY 04/17/23  Yes Etta Grandchild, MD  rivaroxaban (XARELTO) 10 MG TABS tablet Take 1 tablet (10 mg total) by mouth daily. 03/05/23  Yes Etta Grandchild, MD  traMADol-acetaminophen (ULTRACET) 37.5-325 MG tablet TAKE 1 TABLET BY MOUTH EVERY 6 HOURS AS NEEDED Patient taking differently: Take 1 tablet by mouth daily. 10/07/22  Yes Etta Grandchild, MD  verapamil (CALAN-SR) 240 MG CR tablet TAKE 1 TABLET(240 MG) BY MOUTH DAILY 01/21/23  Yes Etta Grandchild, MD  polyethylene glycol powder (GLYCOLAX/MIRALAX) 17 GM/SCOOP powder Take 17 g by mouth daily. 12/15/20   Just, Azalee Course, FNP      Allergies    Patient has no known allergies.  Review of Systems   Review of Systems  Physical Exam Updated Vital Signs BP (!) 150/69   Pulse 96   Temp 98.2 F (36.8 C) (Oral)   Resp 19   Ht 4\' 10"  (1.473 m)   Wt 52.6 kg   SpO2 100%   BMI 24.24 kg/m  Physical Exam Constitutional: Alert and orientedx3. Nontoxic, NAD, pleasant and talkative Eyes: Conjunctivae are normal. ENT      Head: Normocephalic and atraumatic. Cardiovascular: S1, S2, borderline tachycardic, Normal and symmetric distal pulses are present in all extremities.Warm and well perfused. Respiratory: Normal respiratory effort. O2 sat 100 on RA Gastrointestinal: Soft and nontender. Rectal exam performed with RN, no stool in rectal  vault, no BRBPR, no bleeding appreciated Musculoskeletal: Normal range of motion in all extremities. Neurologic: Normal speech and language. No gross focal neurologic deficits are appreciated. Skin: Skin is warm, dry and intact. No rash noted. Psychiatric: Mood and affect are normal. Speech and behavior are normal.  ED Results / Procedures / Treatments   Labs (all labs ordered are listed, but only abnormal results are displayed) Labs Reviewed  COMPREHENSIVE METABOLIC PANEL - Abnormal; Notable for the following components:      Result Value   Glucose, Bld 107 (*)    Calcium 8.5 (*)    Total Protein 5.6 (*)    Albumin 2.8 (*)    All other components within normal limits  CBC WITH DIFFERENTIAL/PLATELET - Abnormal; Notable for the following components:   RBC 2.03 (*)    Hemoglobin 5.5 (*)    HCT 18.8 (*)    MCHC 29.3 (*)    RDW 15.9 (*)    Platelets 456 (*)    Lymphs Abs 0.4 (*)    All other components within normal limits  PROTIME-INR - Abnormal; Notable for the following components:   Prothrombin Time 15.6 (*)    INR 1.3 (*)    All other components within normal limits  I-STAT VENOUS BLOOD GAS, ED - Abnormal; Notable for the following components:   pH, Ven 7.461 (*)    pCO2, Ven 38.1 (*)    pO2, Ven 51 (*)    Acid-Base Excess 3.0 (*)    HCT 18.0 (*)    Hemoglobin 6.1 (*)    All other components within normal limits  ETHANOL  APTT  URINALYSIS, ROUTINE W REFLEX MICROSCOPIC  BASIC METABOLIC PANEL  CBC  POC OCCULT BLOOD, ED  TYPE AND SCREEN  PREPARE RBC (CROSSMATCH)    EKG None  Radiology CT Head Wo Contrast  Result Date: 04/23/2023 CLINICAL DATA:  Altered mental status EXAM: CT HEAD WITHOUT CONTRAST TECHNIQUE: Contiguous axial images were obtained from the base of the skull through the vertex without intravenous contrast. RADIATION DOSE REDUCTION: This exam was performed according to the departmental dose-optimization program which includes automated exposure control,  adjustment of the mA and/or kV according to patient size and/or use of iterative reconstruction technique. COMPARISON:  CT head 06/15/10 FINDINGS: Brain: No evidence of acute infarction, hemorrhage, hydrocephalus, extra-axial collection or mass lesion/mass effect. Sequela of mild chronic microvascular ischemic change. Vascular: No hyperdense vessel or unexpected calcification. Skull: Normal. Negative for fracture or focal lesion. Sinuses/Orbits: No middle ear or mastoid effusion. Paranasal sinuses are clear. Bilateral lens replacement. Orbits are otherwise unremarkable. Other: None. IMPRESSION: No acute intracranial abnormality. Electronically Signed   By: Lorenza Cambridge M.D.   On: 04/23/2023 12:24   DG Chest Portable 1 View  Result Date: 04/23/2023 CLINICAL DATA:  Dizziness.  Infection EXAM: PORTABLE CHEST 1 VIEW COMPARISON:  X-ray 08/07/2022 FINDINGS: Film is rotated to the right. Tortuous and ectatic aorta. Normal cardiopericardial silhouette. Presumed hiatal hernia. No consolidation, pneumothorax or effusion. No edema. Overlapping cardiac leads. Degenerative changes seen of the shoulders. IMPRESSION: Rotated radiograph.  Hiatal hernia. No consolidation or pneumothorax. Electronically Signed   By: Karen Kays M.D.   On: 04/23/2023 11:30    Procedures .Critical Care  Performed by: Mardene Sayer, MD Authorized by: Mardene Sayer, MD   Critical care provider statement:    Critical care time (minutes):  45   Critical care was necessary to treat or prevent imminent or life-threatening deterioration of the following conditions: anemia/GIB requiring blood products.   Critical care was time spent personally by me on the following activities:  Development of treatment plan with patient or surrogate, discussions with consultants, evaluation of patient's response to treatment, examination of patient, ordering and review of laboratory studies, ordering and review of radiographic studies, ordering and  performing treatments and interventions, pulse oximetry, re-evaluation of patient's condition, review of old charts and obtaining history from patient or surrogate   Care discussed with: admitting provider       Medications Ordered in ED Medications  sodium chloride flush (NS) 0.9 % injection 3 mL (3 mLs Intravenous Given 04/23/23 1709)  acetaminophen (TYLENOL) tablet 650 mg (has no administration in time range)    Or  acetaminophen (TYLENOL) suppository 650 mg (has no administration in time range)  pantoprozole (PROTONIX) 80 mg /NS 100 mL infusion (8 mg/hr Intravenous New Bag/Given 04/23/23 1708)  pantoprazole (PROTONIX) injection 40 mg (has no administration in time range)  polyethylene glycol (MIRALAX / GLYCOLAX) packet 17 g (has no administration in time range)  sodium chloride 0.9 % bolus 500 mL (0 mLs Intravenous Stopped 04/23/23 1244)  pantoprazole (PROTONIX) injection 40 mg (40 mg Intravenous Given 04/23/23 1234)  0.9 %  sodium chloride infusion (Manually program via Guardrails IV Fluids) (0 mLs Intravenous Stopped 04/23/23 1710)    ED Course/ Medical Decision Making/ A&P Clinical Course as of 04/23/23 1834  Mon Apr 23, 2023  1359 Spoke with hospitalist for admission requesting GI consult for evaluation for possible upper GI bleed.  I placed consult. [VB]    Clinical Course User Index [VB] Mardene Sayer, MD                             Medical Decision Making [name] is a 89 y.o. female.  With PMH of HTN, HLD, CKD, dementia who was brought in from home for increased confusion from baseline per niece.  Niece is not present at bedside.  Patient is historian.  She has underlying dementia and is not best historian however still able to answer questions and talkative on exam.   Regarding patient's generalized weakness, differential is broad and includes but is not limited to acute electrolyte disturbances (such as hypokalemia, hypoglycemia, hypoMg, hypoCa),infection,  arrhythmia, anemia, dehydration among multiple other etiologies. Intact neurologic exam and chronic nature of symptoms make CNS etiology such as stroke, tumor unlikely.    CT head unremarkable no acute findings.  Chest x-ray reviewed by me hiatal hernia no pneumonia.  Labs significantly notable for new anemia normocytic hemoglobin 5.5 down from approximately 10 only 1 month prior.  Creatinine is 0.88 within normal limits and glucose 107.  Based on patient's review of history, she has had previous upper GI bleed in the  past.  Last endoscopy back in 2020.  She is borderline tachycardic however not hypotensive on exam.  Guarding patient on 2 units PRBCs and ordered for IV Protonix.  No history of cirrhosis and no hemodynamic significant bleed concerning for varices.  Spoke to GI who will plan for inpatient endoscopy will continue on IV Protonix.  Spoke with Dr. Alinda Money hospitalist for admission for new anemia and suspected upper GI bleed.      Amount and/or Complexity of Data Reviewed Labs: ordered. Radiology: ordered.  Risk Prescription drug management. Decision regarding hospitalization.     Final Clinical Impression(s) / ED Diagnoses Final diagnoses:  Anemia, unspecified type  Weakness    Rx / DC Orders ED Discharge Orders     None         Mardene Sayer, MD 04/23/23 [phone]

## 2023-04-23 NOTE — Plan of Care (Signed)

## 2023-04-23 NOTE — H&P (Signed)
History and Physical   Erilyn Pearman ZOX:[id number] DOB: October 04, 1933 DOA: 04/23/2023  PCP: Etta Grandchild, MD   Patient coming from: Home  Chief Complaint: Dizziness, confusion  HPI: [name] is a 89 y.o. female with medical history significant of dementia, CKD 3B, GERD, Barrett's esophagitis, GI bleed, hypertension, CAD, anemia, DVT, migraines, glaucoma presenting with dizziness and confusion.  History obtained assistance of chart review and family due to patient's confusion and dementia.  Patient was noted to be altered from her baseline today with lightheadedness and dizziness as well.   Patient states she may have had lightheadedness and dizziness but otherwise just felt off.  She states she is feeling better now that she is getting blood transfused.  She does report some nausea and vomiting but not sure of the color.  But thinks it may have been brown.  She also reports ongoing issues with constipation and hard small ball-like stools.  Patient has known history of upper GI bleed with upper endoscopy in the past.  History of Barrett's esophagus.  No reported bloody or black stools.  She denies fevers, chills, chest pain, shortness of breath, abdominal pain, constipation, diarrhea.  ED Course: Vital signs in the ED notable for heart rate in the 90s to 100s, blood pressure in the 120s to 160s systolic.  Lab workup included CMP withGlucose 107, calcium 8.5, protein 5.6, albumin 2.8.  CBC with hemoglobin 5.5 down from 10 a month ago, platelets 456.  PT and INR mildly elevated at 15.6 and 1.3 respectively.  FOBT negative, EDP reported there is no stool in the rectal vault, so an adequate sample is likely.  Review of Systems: As per HPI otherwise all other systems reviewed and are negative.  Past Medical History:  Diagnosis Date   Abnormal finding on Pap smear, ASCUS    Acute renal insufficiency    Amputation finger    index   Barrett's esophagus    CAD (coronary artery disease)     Dementia (HCC)    Diverticulosis    Elevated BP    Foot deformity    GERD (gastroesophageal reflux disease)    Glaucoma    H/O: GI bleed    HSV-2 (herpes simplex virus 2) infection    Hyperglycemia    Hypertension    Migraine    Osteoporosis    worsening   Spondylosis     Past Surgical History:  Procedure Laterality Date   AMPUTATION FINGER / THUMB     secondary to osteomyelitis   CATARACT EXTRACTION, BILATERAL Bilateral 12/25/2016   COLONOSCOPY     ESOPHAGOGASTRODUODENOSCOPY N/A 10/22/2016   Procedure: ESOPHAGOGASTRODUODENOSCOPY (EGD);  Surgeon: Sherrilyn Rist, MD;  Location: [facility] ENDOSCOPY;  Service: Endoscopy;  Laterality: N/A;   FOOT SURGERY     right   PARTIAL HYSTERECTOMY     UPPER GASTROINTESTINAL ENDOSCOPY      Social History  reports that she has never smoked. She has never used smokeless tobacco. She reports that she does not drink alcohol and does not use drugs.  No Known Allergies  Family History  Problem Relation Age of Onset   Breast cancer Mother    Heart attack Mother    Heart disease Father    Alcohol abuse Son    Colon cancer Neg Hx    Stomach cancer Neg Hx    Rectal cancer Neg Hx   Reviewed on admission  Prior to Admission medications   Medication Sig Start Date End Date Taking? Authorizing Provider  alum hydroxide-mag trisilicate (GAVISCON) 80-20 MG CHEW chewable tablet Chew 1 tablet by mouth 3 (three) times daily as needed for indigestion or heartburn. 05/02/22   Etta Grandchild, MD  FEROSUL 325 (65 Fe) MG tablet TAKE 1 TABLET(325 MG) BY MOUTH DAILY WITH BREAKFAST 06/23/22   Etta Grandchild, MD  gabapentin (NEURONTIN) 400 MG capsule Take 1 capsule (400 mg total) by mouth at bedtime. 01/27/22   Etta Grandchild, MD  hydrochlorothiazide (HYDRODIURIL) 25 MG tablet TAKE 1 TABLET(25 MG) BY MOUTH DAILY 04/23/23   Etta Grandchild, MD  hydrocortisone 2.5 % ointment APPLY EXTERNALLY TO THE AFFECTED AREA TWICE DAILY 05/31/18   Ethelda Chick, MD  latanoprost  (XALATAN) 0.005 % ophthalmic solution INT 1 GTT IN OD QD 08/26/19   [provider]  lidocaine (LIDODERM) 5 % Place 1 patch onto the skin daily as needed. Apply patch to area most significant pain once per day.  Remove and discard patch within 12 hours of application. 12/15/20   Just, Azalee Course, FNP  metoprolol succinate (TOPROL-XL) 25 MG 24 hr tablet Take 1 tablet (25 mg total) by mouth daily. 04/24/22   Pincus Sanes, MD  nystatin cream (MYCOSTATIN) Apply to external affected area twice a day for 10 days as needed. 11/08/19   Doristine Bosworth, MD  pantoprazole (PROTONIX) 40 MG tablet TAKE 1 TABLET BY MOUTH 1 TO 2 TIMES DAILY 12/06/22   Etta Grandchild, MD  polyethylene glycol powder (GLYCOLAX/MIRALAX) 17 GM/SCOOP powder Take 17 g by mouth daily. 12/15/20   Just, Azalee Course, FNP  potassium chloride SA (KLOR-CON M) 20 MEQ tablet TAKE 1 TABLET(20 MEQ) BY MOUTH DAILY 04/17/23   Etta Grandchild, MD  rivaroxaban (XARELTO) 10 MG TABS tablet Take 1 tablet (10 mg total) by mouth daily. 03/05/23   Etta Grandchild, MD  traMADol-acetaminophen (ULTRACET) 37.5-325 MG tablet TAKE 1 TABLET BY MOUTH EVERY 6 HOURS AS NEEDED 10/07/22   Etta Grandchild, MD  verapamil (CALAN-SR) 240 MG CR tablet TAKE 1 TABLET(240 MG) BY MOUTH DAILY 01/21/23   Etta Grandchild, MD    Physical Exam: Vitals:   04/23/23 1033 04/23/23 1034 04/23/23 1040  BP:  (!) 160/63   Pulse:  96   Resp:  18   Temp:  98.1 F (36.7 C)   TempSrc:  Oral   SpO2: 100% 100%   Weight:   52.6 kg  Height:   4\' 10"  (1.473 m)    Physical Exam Constitutional:      General: She is not in acute distress.    Appearance: Normal appearance.  HENT:     Head: Normocephalic and atraumatic.     Mouth/Throat:     Mouth: Mucous membranes are moist.     Pharynx: Oropharynx is clear.  Eyes:     Extraocular Movements: Extraocular movements intact.     Pupils: Pupils are equal, round, and reactive to light.  Cardiovascular:     Rate and Rhythm: Regular  rhythm. Tachycardia present.     Pulses: Normal pulses.     Heart sounds: Normal heart sounds.  Pulmonary:     Effort: Pulmonary effort is normal. No respiratory distress.     Breath sounds: Normal breath sounds.  Abdominal:     General: Bowel sounds are normal. There is no distension.     Palpations: Abdomen is soft.     Tenderness: There is no abdominal tenderness.  Musculoskeletal:        General: No  swelling or deformity.  Skin:    General: Skin is warm and dry.  Neurological:     General: No focal deficit present.     Mental Status: Mental status is at baseline.    Labs on Admission: I have personally reviewed following labs and imaging studies  CBC: Recent Labs  Lab 04/23/23 1104 04/23/23 1150  WBC 5.0  --   NEUTROABS 3.8  --   HGB 5.5* 6.1*  HCT 18.8* 18.0*  MCV 92.6  --   PLT 456*  --     Basic Metabolic Panel: Recent Labs  Lab 04/23/23 1104 04/23/23 1150  NA 143 142  K 3.6 3.6  CL 106  --   CO2 28  --   GLUCOSE 107*  --   BUN 14  --   CREATININE 0.88  --   CALCIUM 8.5*  --     GFR: Estimated Creatinine Clearance: 32.4 mL/min (by C-G formula based on SCr of 0.88 mg/dL).  Liver Function Tests: Recent Labs  Lab 04/23/23 1104  AST 15  ALT 13  ALKPHOS 44  BILITOT 0.4  PROT 5.6*  ALBUMIN 2.8*    Urine analysis:    Component Value Date/Time   COLORURINE YELLOW 10/21/2016 1633   APPEARANCEUR CLOUDY (A) 10/21/2016 1633   LABSPEC 1.013 10/21/2016 1633   PHURINE 6.0 10/21/2016 1633   GLUCOSEU NEGATIVE 10/21/2016 1633   HGBUR NEGATIVE 10/21/2016 1633   BILIRUBINUR negative 06/16/2020 1636   BILIRUBINUR Negative 08/26/2015 1256   KETONESUR negative 06/16/2020 1636   KETONESUR NEGATIVE 10/21/2016 1633   PROTEINUR negative 04/16/2020 1434   PROTEINUR NEGATIVE 10/21/2016 1633   UROBILINOGEN 0.2 06/16/2020 1636   UROBILINOGEN 0.2 02/26/2014 1008   NITRITE Positive (A) 12/15/2020 1538   NITRITE POSITIVE (A) 10/21/2016 1633   LEUKOCYTESUR  Negative 12/15/2020 1538    Radiological Exams on Admission: CT Head Wo Contrast  Result Date: 04/23/2023 CLINICAL DATA:  Altered mental status EXAM: CT HEAD WITHOUT CONTRAST TECHNIQUE: Contiguous axial images were obtained from the base of the skull through the vertex without intravenous contrast. RADIATION DOSE REDUCTION: This exam was performed according to the departmental dose-optimization program which includes automated exposure control, adjustment of the mA and/or kV according to patient size and/or use of iterative reconstruction technique. COMPARISON:  CT head 06/15/10 FINDINGS: Brain: No evidence of acute infarction, hemorrhage, hydrocephalus, extra-axial collection or mass lesion/mass effect. Sequela of mild chronic microvascular ischemic change. Vascular: No hyperdense vessel or unexpected calcification. Skull: Normal. Negative for fracture or focal lesion. Sinuses/Orbits: No middle ear or mastoid effusion. Paranasal sinuses are clear. Bilateral lens replacement. Orbits are otherwise unremarkable. Other: None. IMPRESSION: No acute intracranial abnormality. Electronically Signed   By: Lorenza Cambridge M.D.   On: 04/23/2023 12:24   DG Chest Portable 1 View  Result Date: 04/23/2023 CLINICAL DATA:  Dizziness.  Infection EXAM: PORTABLE CHEST 1 VIEW COMPARISON:  X-ray 08/07/2022 FINDINGS: Film is rotated to the right. Tortuous and ectatic aorta. Normal cardiopericardial silhouette. Presumed hiatal hernia. No consolidation, pneumothorax or effusion. No edema. Overlapping cardiac leads. Degenerative changes seen of the shoulders. IMPRESSION: Rotated radiograph.  Hiatal hernia. No consolidation or pneumothorax. Electronically Signed   By: Karen Kays M.D.   On: 04/23/2023 11:30    EKG: Not yet performed in the ED.  Assessment/Plan Principal Problem:   Symptomatic anemia Active Problems:   ANEMIA, IRON DEFICIENCY   GLAUCOMA   Essential hypertension   Dementia (HCC)   CAD (coronary artery  disease)  Gastroesophageal reflux disease without esophagitis   Stage 3b chronic kidney disease (HCC)   Chronic deep vein thrombosis (DVT) of right femoral vein (HCC)   Symptomatic anemia Presumed upper GI bleed > Patient presenting with symptomatic anemia.  Had some acute on chronic confusion in the setting of dementia with dizziness and lightheadedness this morning. > Hemoglobin noted to be 5.5 from baseline of 10 a month ago.  Normal MCV.  Suspect acute to subacute bleeding. > No bloody or black stools reported.  No stool in the rectal vault on EDP examination.  Due to this poor sample FOBT was negative, unclear if truly negative for just due to the poor sample. > GI consulted in the ED.  Does have history of prior upper endoscopy showing Barrett's esophagus and prior episodes of presumed upper GI bleeding with apparent hematemesis.  She cannot remember what her vomiting look like recently. > Patient does take blood thinner for history of DVT. - Monitor in progressive unit - Appreciate GI recommendations - Continue with 2 unit transfusion ordered in the ED - Trend hemoglobin - Hold anticoagulation, SCDs - If hemodynamic instability stat CT for possible internal bleeding  Dementia - Noted - Delirium precautions  CKD 3B > Stable in the ED. - Trend renal function and electrolytes  Hypertension - Holding home antihypertensives in the setting of dizziness and lightheadedness with significant suspected bleeding and symptomatic anemia as above.  CAD - Holding home metoprolol, Xarelto  History of DVT - Holding home Xarelto  Glaucoma - Continue home eyedrops  DVT prophylaxis: SCDs Code Status:   Full Family Communication:  Updated at bedside. Disposition Plan:   Patient is from:  Home  Anticipated DC to:  Home  Anticipated DC date:  1 to 4 days  Anticipated DC barriers: None  Consults called:  Gastroenterology consulted in the ED Admission status:  Observation,  progressive  Severity of Illness: The appropriate patient status for this patient is OBSERVATION. Observation status is judged to be reasonable and necessary in order to provide the required intensity of service to ensure the patient's safety. The patient's presenting symptoms, physical exam findings, and initial radiographic and laboratory data in the context of their medical condition is felt to place them at decreased risk for further clinical deterioration. Furthermore, it is anticipated that the patient will be medically stable for discharge from the hospital within 2 midnights of admission.    Synetta Fail MD [facility]  How to contact the [facility] Attending or Consulting provider 7A - 7P or covering provider during after hours 7P -7A, for this patient?   Check the care team in [facility] and look for a) attending/consulting [facility] provider listed and b) the [facility] team listed Log into [email or web address] and use [place]'s universal password to access. If you do not have the password, please contact the hospital operator. Locate the [facility] provider you are looking for under [facility] and page to a number that you can be directly reached. If you still have difficulty reaching the provider, please page the [facility] (Director on Call) for the Hospitalists listed on amion for assistance.  04/23/2023, 2:05 PM

## 2023-04-23 NOTE — Telephone Encounter (Signed)
Called pt no answer LMOM RTC.../lmb 

## 2023-04-24 ENCOUNTER — Encounter (HOSPITAL_COMMUNITY)
Admission: EM | Disposition: A | Discharge status: Home Health | Payer: Self-pay | Source: Home / Self Care | Attending: Internal Medicine

## 2023-04-24 ENCOUNTER — Other Ambulatory Visit: Payer: Self-pay | Admitting: Internal Medicine

## 2023-04-24 ENCOUNTER — Observation Stay (HOSPITAL_COMMUNITY): Payer: Medicare Other | Admitting: Certified Registered Nurse Anesthetist

## 2023-04-24 ENCOUNTER — Encounter (HOSPITAL_COMMUNITY): Payer: Self-pay | Admitting: Internal Medicine

## 2023-04-24 DIAGNOSIS — K2270 Barrett's esophagus without dysplasia: Secondary | ICD-10-CM | POA: Diagnosis not present

## 2023-04-24 DIAGNOSIS — Z7901 Long term (current) use of anticoagulants: Secondary | ICD-10-CM | POA: Diagnosis not present

## 2023-04-24 DIAGNOSIS — H409 Unspecified glaucoma: Secondary | ICD-10-CM | POA: Diagnosis present

## 2023-04-24 DIAGNOSIS — I82511 Chronic embolism and thrombosis of right femoral vein: Secondary | ICD-10-CM | POA: Diagnosis not present

## 2023-04-24 DIAGNOSIS — K2970 Gastritis, unspecified, without bleeding: Secondary | ICD-10-CM | POA: Diagnosis not present

## 2023-04-24 DIAGNOSIS — D509 Iron deficiency anemia, unspecified: Secondary | ICD-10-CM

## 2023-04-24 DIAGNOSIS — Z86718 Personal history of other venous thrombosis and embolism: Secondary | ICD-10-CM | POA: Diagnosis not present

## 2023-04-24 DIAGNOSIS — Z7982 Long term (current) use of aspirin: Secondary | ICD-10-CM | POA: Diagnosis not present

## 2023-04-24 DIAGNOSIS — F0390 Unspecified dementia without behavioral disturbance: Secondary | ICD-10-CM | POA: Diagnosis present

## 2023-04-24 DIAGNOSIS — Z9841 Cataract extraction status, right eye: Secondary | ICD-10-CM | POA: Diagnosis not present

## 2023-04-24 DIAGNOSIS — K2289 Other specified disease of esophagus: Secondary | ICD-10-CM

## 2023-04-24 DIAGNOSIS — Z90711 Acquired absence of uterus with remaining cervical stump: Secondary | ICD-10-CM | POA: Diagnosis not present

## 2023-04-24 DIAGNOSIS — D649 Anemia, unspecified: Secondary | ICD-10-CM | POA: Diagnosis not present

## 2023-04-24 DIAGNOSIS — D62 Acute posthemorrhagic anemia: Secondary | ICD-10-CM | POA: Diagnosis present

## 2023-04-24 DIAGNOSIS — I10 Essential (primary) hypertension: Secondary | ICD-10-CM

## 2023-04-24 DIAGNOSIS — K2950 Unspecified chronic gastritis without bleeding: Secondary | ICD-10-CM

## 2023-04-24 DIAGNOSIS — R531 Weakness: Secondary | ICD-10-CM | POA: Diagnosis present

## 2023-04-24 DIAGNOSIS — N1832 Chronic kidney disease, stage 3b: Secondary | ICD-10-CM | POA: Diagnosis present

## 2023-04-24 DIAGNOSIS — K2211 Ulcer of esophagus with bleeding: Secondary | ICD-10-CM | POA: Diagnosis present

## 2023-04-24 DIAGNOSIS — Z8249 Family history of ischemic heart disease and other diseases of the circulatory system: Secondary | ICD-10-CM | POA: Diagnosis not present

## 2023-04-24 DIAGNOSIS — K5710 Diverticulosis of small intestine without perforation or abscess without bleeding: Secondary | ICD-10-CM

## 2023-04-24 DIAGNOSIS — I2510 Atherosclerotic heart disease of native coronary artery without angina pectoris: Secondary | ICD-10-CM

## 2023-04-24 DIAGNOSIS — K449 Diaphragmatic hernia without obstruction or gangrene: Secondary | ICD-10-CM

## 2023-04-24 DIAGNOSIS — I3139 Other pericardial effusion (noninflammatory): Secondary | ICD-10-CM | POA: Diagnosis not present

## 2023-04-24 DIAGNOSIS — K2100 Gastro-esophageal reflux disease with esophagitis, without bleeding: Secondary | ICD-10-CM | POA: Diagnosis not present

## 2023-04-24 DIAGNOSIS — Z9842 Cataract extraction status, left eye: Secondary | ICD-10-CM | POA: Diagnosis not present

## 2023-04-24 DIAGNOSIS — I82409 Acute embolism and thrombosis of unspecified deep veins of unspecified lower extremity: Secondary | ICD-10-CM | POA: Diagnosis not present

## 2023-04-24 DIAGNOSIS — K209 Esophagitis, unspecified without bleeding: Secondary | ICD-10-CM | POA: Diagnosis not present

## 2023-04-24 DIAGNOSIS — I129 Hypertensive chronic kidney disease with stage 1 through stage 4 chronic kidney disease, or unspecified chronic kidney disease: Secondary | ICD-10-CM | POA: Diagnosis present

## 2023-04-24 DIAGNOSIS — D508 Other iron deficiency anemias: Secondary | ICD-10-CM | POA: Diagnosis not present

## 2023-04-24 DIAGNOSIS — K5900 Constipation, unspecified: Secondary | ICD-10-CM | POA: Diagnosis present

## 2023-04-24 DIAGNOSIS — J9811 Atelectasis: Secondary | ICD-10-CM | POA: Diagnosis not present

## 2023-04-24 DIAGNOSIS — G43909 Migraine, unspecified, not intractable, without status migrainosus: Secondary | ICD-10-CM | POA: Diagnosis present

## 2023-04-24 DIAGNOSIS — Z79899 Other long term (current) drug therapy: Secondary | ICD-10-CM | POA: Diagnosis not present

## 2023-04-24 DIAGNOSIS — E785 Hyperlipidemia, unspecified: Secondary | ICD-10-CM | POA: Diagnosis present

## 2023-04-24 DIAGNOSIS — M810 Age-related osteoporosis without current pathological fracture: Secondary | ICD-10-CM | POA: Diagnosis present

## 2023-04-24 HISTORY — PX: ESOPHAGOGASTRODUODENOSCOPY: SHX5428

## 2023-04-24 HISTORY — PX: BIOPSY: SHX5522

## 2023-04-24 LAB — TYPE AND SCREEN
Antibody Screen: NEGATIVE
Unit division: 0

## 2023-04-24 LAB — BASIC METABOLIC PANEL
Anion gap: 8 (ref 5–15)
BUN: 8 mg/dL (ref 8–23)
CO2: 26 mmol/L (ref 22–32)
Calcium: 8.8 mg/dL — ABNORMAL LOW (ref 8.9–10.3)
Chloride: 107 mmol/L (ref 98–111)
Creatinine, Ser: 0.79 mg/dL (ref 0.44–1.00)
GFR, Estimated: >60 mL/min (ref >60)
Glucose, Bld: 104 mg/dL — ABNORMAL HIGH (ref 70–99)
Potassium: 3.5 mmol/L (ref 3.5–5.1)
Sodium: 141 mmol/L (ref 135–145)

## 2023-04-24 LAB — CBC
HCT: 28.4 % — ABNORMAL LOW (ref 36.0–46.0)
HCT: 30.8 % — ABNORMAL LOW (ref 36.0–46.0)
Hemoglobin: 9.5 g/dL — ABNORMAL LOW (ref 12.0–15.0)
Hemoglobin: 10 g/dL — ABNORMAL LOW (ref 12.0–15.0)
MCH: 27.8 pg (ref 26.0–34.0)
MCH: 27.3 pg (ref 26.0–34.0)
MCHC: 33.5 g/dL (ref 30.0–36.0)
MCHC: 32.5 g/dL (ref 30.0–36.0)
MCV: 83 fL (ref 80.0–100.0)
MCV: 84.2 fL (ref 80.0–100.0)
Platelets: 409 10*3/uL — ABNORMAL HIGH (ref 150–400)
Platelets: 424 10*3/uL — ABNORMAL HIGH (ref 150–400)
RBC: 3.42 MIL/uL — ABNORMAL LOW (ref 3.87–5.11)
RBC: 3.66 MIL/uL — ABNORMAL LOW (ref 3.87–5.11)
RDW: 17.1 % — ABNORMAL HIGH (ref 11.5–15.5)
RDW: 16.9 % — ABNORMAL HIGH (ref 11.5–15.5)
WBC: 4.6 10*3/uL (ref 4.0–10.5)
WBC: 4.3 10*3/uL (ref 4.0–10.5)
nRBC: 0 % (ref 0.0–0.2)
nRBC: 0 % (ref 0.0–0.2)

## 2023-04-24 LAB — VITAMIN B12: Vitamin B-12: 790 pg/mL (ref 180–914)

## 2023-04-24 LAB — RETICULOCYTES
Immature Retic Fract: 11.3 % (ref 2.3–15.9)
RBC.: 3.44 MIL/uL — ABNORMAL LOW (ref 3.87–5.11)
Retic Count, Absolute: 101.5 10*3/uL (ref 19.0–186.0)
Retic Ct Pct: 3.1 % (ref 0.4–3.1)

## 2023-04-24 LAB — BPAM RBC
Blood Product Expiration Date: 202405222359
Blood Product Expiration Date: 202405222359
ISSUE DATE / TIME: 202404291357
ISSUE DATE / TIME: 202404291635
Unit Type and Rh: 5100

## 2023-04-24 LAB — IRON AND TIBC
Iron: 16 ug/dL — ABNORMAL LOW (ref 28–170)
Saturation Ratios: 4 % — ABNORMAL LOW (ref 10.4–31.8)
TIBC: 419 ug/dL (ref 250–450)
UIBC: 403 ug/dL

## 2023-04-24 LAB — MAGNESIUM: Magnesium: 1.9 mg/dL (ref 1.7–2.4)

## 2023-04-24 LAB — FOLATE: Folate: >40 ng/mL (ref >5.9)

## 2023-04-24 LAB — FERRITIN: Ferritin: 8 ng/mL — ABNORMAL LOW (ref 11–307)

## 2023-04-24 SURGERY — EGD (ESOPHAGOGASTRODUODENOSCOPY)
Anesthesia: Monitor Anesthesia Care

## 2023-04-24 MED ORDER — PHENYLEPHRINE 80 MCG/ML (10ML) SYRINGE FOR IV PUSH (FOR BLOOD PRESSURE SUPPORT)
PREFILLED_SYRINGE | INTRAVENOUS | Status: DC | PRN
  Administered 2023-04-24: 160 ug via INTRAVENOUS

## 2023-04-24 MED ORDER — GABAPENTIN 300 MG PO CAPS
300.0000 mg | ORAL_CAPSULE | Freq: Once | ORAL | Status: AC
  Administered 2023-04-24: 300 mg via ORAL
  Filled 2023-04-24: qty 1

## 2023-04-24 MED ORDER — GABAPENTIN 100 MG PO CAPS
100.0000 mg | ORAL_CAPSULE | Freq: Once | ORAL | Status: AC
  Administered 2023-04-24: 100 mg via ORAL
  Filled 2023-04-24: qty 1

## 2023-04-24 MED ORDER — LACTATED RINGERS IV SOLN
INTRAVENOUS | Status: AC | PRN
  Administered 2023-04-24: 20 mL/h via INTRAVENOUS

## 2023-04-24 MED ORDER — SODIUM CHLORIDE 0.9 % IV SOLN: INTRAVENOUS | Status: DC

## 2023-04-24 MED ORDER — MELATONIN 5 MG PO TABS
5.0000 mg | ORAL_TABLET | Freq: Every evening | ORAL | Status: DC | PRN
  Administered 2023-04-24 – 2023-04-26 (×4): 5 mg via ORAL
  Filled 2023-04-24 (×3): qty 1

## 2023-04-24 MED ORDER — PROPOFOL 10 MG/ML IV BOLUS
INTRAVENOUS | Status: DC | PRN
  Administered 2023-04-24 (×2): 20 mg via INTRAVENOUS
  Administered 2023-04-24: 40 mg via INTRAVENOUS
  Administered 2023-04-24: 20 mg via INTRAVENOUS

## 2023-04-24 MED ORDER — ALUM & MAG HYDROXIDE-SIMETH 200-200-20 MG/5ML PO SUSP
15.0000 mL | ORAL | Status: DC | PRN
  Administered 2023-04-24 – 2023-04-27 (×4): 15 mL via ORAL
  Filled 2023-04-24 (×4): qty 30

## 2023-04-24 NOTE — Op Note (Signed)
[facility] Patient Name: [name] Procedure Date : 04/24/2023 MRN: [id number] Attending MD: Doristine Locks , MD, [id number] Date of Birth: Oct 10, 1933 CSN: [id number] Age: 89 Admit Type: Inpatient Procedure:                Upper GI endoscopy Indications:              Acute on chronic iron deficiency anemia,                            symptomatic anemia                           89 year old female with a history of GERD                            complicated by erosive esophagitis, nondysplastic                            Barrett's Esophagus, and large 9 cm hiatal hernia,                            admitted with acute on chronic anemia with symptoms                            (dizziness). Admission H/H 5.5/18.8 with good                            response to 2 unit PRBC transfusion. Repeat H/H                            9.5/28 today. Ferritin 8, iron 16, TIBC 419, sat 4% Providers:                Doristine Locks, MD, Dartha Lodge, RN, Rozetta Nunnery, Technician Referring MD:              Medicines:                Monitored Anesthesia Care Complications:            No immediate complications. Estimated Blood Loss:     Estimated blood loss was minimal. Procedure:                Pre-Anesthesia Assessment:                           - Prior to the procedure, a History and Physical                            was performed, and patient medications and                            allergies were reviewed. The patient's tolerance of  previous anesthesia was also reviewed. The risks                            and benefits of the procedure and the sedation                            options and risks were discussed with the patient.                            All questions were answered, and informed consent                            was obtained. Prior Anticoagulants: The patient has                            taken  Xarelto (rivaroxaban), last dose was 2 days                            prior to procedure. ASA Grade Assessment: III - A                            patient with severe systemic disease. After                            reviewing the risks and benefits, the patient was                            deemed in satisfactory condition to undergo the                            procedure.                           After obtaining informed consent, the endoscope was                            passed under direct vision. Throughout the                            procedure, the patient's blood pressure, pulse, and                            oxygen saturations were monitored continuously. The                            [id number]-H190 ([id number]) Olympus endoscope was introduced                            through the mouth, and advanced to the second part                            of duodenum. The upper GI endoscopy was  accomplished without difficulty. The patient                            tolerated the procedure well. Scope In: Scope Out: Findings:      A single medium-sized bleb vs single downhill varix was found in the       upper third of the esophagus, 18 cm from the incisors.      LA Grade C (one or more mucosal breaks continuous between tops of 2 or       more mucosal folds, less than 75% circumference) esophagitis with no       bleeding was found in the lower third of the esophagus.      A large hiatal hernia was present. There was no bleeding in the hernia       sac, and no Cameron erosions noted.      Mild inflammation characterized by congestion (edema) was found in the       gastric body. Biopsies were taken with a cold forceps for Helicobacter       pylori testing. Estimated blood loss was minimal.      The incisura, gastric antrum and pylorus were normal.      A small non-bleeding diverticulum was found in the second portion of the       duodenum.      The examined  duodenum was normal. Impression:               - Bleb found in the esophagus.                           - LA Grade C reflux esophagitis with no bleeding.                           - Large hiatal hernia.                           - Gastritis. Biopsied.                           - Normal incisura, antrum and pylorus.                           - Non-bleeding duodenal diverticulum.                           - Normal examined duodenum. Recommendation:           - Return patient to hospital ward for ongoing care.                           - Advance diet as tolerated.                           - Perform CT scan (computed tomography) of the                            chest with contrast today.                           - Ok to  resume anticoagulation.                           - Will resume PPI.                           - Inpatient GI service will continue to follow. Procedure Code(s):        --- Professional ---                           [phone], Esophagogastroduodenoscopy, flexible,                            transoral; with biopsy, single or multiple Diagnosis Code(s):        --- Professional ---                           K22.89, Other specified disease of esophagus                           K21.00, Gastro-esophageal reflux disease with                            esophagitis, without bleeding                           K44.9, Diaphragmatic hernia without obstruction or                            gangrene                           K29.70, Gastritis, unspecified, without bleeding                           D50.9, Iron deficiency anemia, unspecified                           K57.10, Diverticulosis of small intestine without                            perforation or abscess without bleeding CPT copyright 2022 American Medical Association. All rights reserved. The codes documented in this report are preliminary and upon coder review may  be revised to meet current compliance requirements. Doristine Locks, MD 04/24/2023 11:20:32 AM Number of Addenda: 0

## 2023-04-24 NOTE — Telephone Encounter (Signed)
Called pt again still no answer LMOM w/MD response.../lmb 

## 2023-04-24 NOTE — Progress Notes (Addendum)
PROGRESS NOTE                                                                                                                                                                                                             Patient Demographics:    [name], is a 89 y.o. female, DOB - [birth year], ZOX:[id number]  Outpatient Primary MD for the patient is [name] Grandchild, MD    LOS - 0  Admit date - 04/23/2023    Chief Complaint  Patient presents with   Altered Mental Status       Brief Narrative (HPI from H&P)   89 y.o. female with medical history significant of dementia, CKD 3B, GERD, Barrett's esophagitis, GI bleed, hypertension, CAD, anemia, DVT, migraines, glaucoma presenting with dizziness and confusion.  Patient has known history of upper GI bleed workup in the ER consistent with severe anemia and she was admitted for further workup.   Subjective:    [name] today has, No headache, No chest pain, No abdominal pain - No Nausea, No new weakness tingling or numbness, no shortness of breath.   Assessment  & Plan :   Symptomatic anemia in a patient with history of Barrett's esophagus and previous UGI bleed, also on Xarelto.  Case discussed with niece patient was having dark hard stools for the last few days, hemoglobin was around 5.5 in the ER post 2 units of packed RBC H&H is better but I think the lab draw was too close to the second unit of transfusion, she is on PPI no signs of ongoing active bleeding, GI on board going for endoscopy evaluation on 04/24/2023.  Continue to monitor CBC, continue PPI, continue to hold Xarelto.  Stool softeners and monitor.   Addendum.  EGD done.  Discussed with GI, gastritis and esophagitis, so some down and esophageal varices were noted, GI physician recommends CT chest to look for SMV thrombus, mediastinal mass. She has what appears to be downhill varices (ie, varices in the upper  esophagus), and most commonly seen with SMV thrombus, but occasionally it is a mass CT chest will be ordered, resume Xarelto from tomorrow if stable per GI.  dementia  -  Noted,  Delirium precautions   CKD 3B > Stable     Hypertension  - Holding home antihypertensives in the setting of dizziness  and lightheadedness with significant suspected bleeding and symptomatic anemia as above.   CAD - Holding home metoprolol, Xarelto   History of DVT - Holding home Xarelto, note she had bilateral lower extremity DVT in August of last year.   Glaucoma - - Continue home eyedrops         Condition - Extremely Guarded  Family Communication  : Niece [name] [phone]  on 04/24/2023 at 9:57 AM  Code Status : Full code  Consults  : GI  PUD Prophylaxis : PPI   Procedures  :     EGD      Disposition Plan  :    Status is: Observation   DVT Prophylaxis  :    SCDs Start: 04/23/23 1356     Lab Results  Component Value Date   PLT 409 (H) 04/24/2023    Diet :  Diet Order             Diet NPO time specified  Diet effective now                    Inpatient Medications  Scheduled Meds:  [MAR Hold] pantoprazole  40 mg Intravenous Q12H   [MAR Hold] polyethylene glycol  17 g Oral BID   [MAR Hold] sodium chloride flush  3 mL Intravenous Q12H   Continuous Infusions:  sodium chloride     pantoprazole 8 mg/hr (04/23/23 1708)   PRN Meds:.[MAR Hold] acetaminophen **OR** [MAR Hold] acetaminophen, [MAR Hold] melatonin  Antibiotics  :    Anti-infectives (From admission, onward)    None         Objective:   Vitals:   04/23/23 2300 04/24/23 0451 04/24/23 0800 04/24/23 0944  BP: (!) 141/96 (!) 159/79  132/74  Pulse: 96 95  94  Resp: 20 20  20   Temp: 99.1 F (37.3 C) 97.8 F (36.6 C)  98.4 F (36.9 C)  TempSrc: Oral Oral Oral Oral  SpO2:    99%  Weight:      Height:        Wt Readings from Last 3 Encounters:  04/23/23 52.6 kg  04/24/22 52.6 kg  11/07/21  53.1 kg     Intake/Output Summary (Last 24 hours) at 04/24/2023 0954 Last data filed at 04/24/2023 0300 Gross per 24 hour  Intake 1194.58 ml  Output 1300 ml  Net -105.42 ml     Physical Exam  Awake Alert, No new F.N deficits, Normal affect [place].AT,PERRAL Supple Neck, No JVD,   Symmetrical Chest wall movement, Good air movement bilaterally, CTAB RRR,No Gallops,Rubs or new Murmurs,  +ve B.Sounds, Abd Soft, No tenderness,   No Cyanosis, Clubbing or edema        Data Review:    Recent Labs  Lab 04/23/23 1104 04/23/23 1150 04/24/23 0556  WBC 5.0  --  4.6  HGB 5.5* 6.1* 9.5*  HCT 18.8* 18.0* 28.4*  PLT 456*  --  409*  MCV 92.6  --  83.0  MCH 27.1  --  27.8  MCHC 29.3*  --  33.5  RDW 15.9*  --  17.1*  LYMPHSABS 0.4*  --   --   MONOABS 0.5  --   --   EOSABS 0.2  --   --   BASOSABS 0.0  --   --     Recent Labs  Lab 04/23/23 1104 04/23/23 1150 04/23/23 1225 04/24/23 0556  NA 143 142  --  141  K 3.6 3.6  --  3.5  CL 106  --   --  107  CO2 28  --   --  26  ANIONGAP 9  --   --  8  GLUCOSE 107*  --   --  104*  BUN 14  --   --  8  CREATININE 0.88  --   --  0.79  AST 15  --   --   --   ALT 13  --   --   --   ALKPHOS 44  --   --   --   BILITOT 0.4  --   --   --   ALBUMIN 2.8*  --   --   --   INR  --   --  1.3*  --   MG  --   --   --  1.9  CALCIUM 8.5*  --   --  8.8*     Radiology Reports CT Head Wo Contrast  Result Date: 04/23/2023 CLINICAL DATA:  Altered mental status EXAM: CT HEAD WITHOUT CONTRAST TECHNIQUE: Contiguous axial images were obtained from the base of the skull through the vertex without intravenous contrast. RADIATION DOSE REDUCTION: This exam was performed according to the departmental dose-optimization program which includes automated exposure control, adjustment of the mA and/or kV according to patient size and/or use of iterative reconstruction technique. COMPARISON:  CT head 06/15/10 FINDINGS: Brain: No evidence of acute infarction, hemorrhage,  hydrocephalus, extra-axial collection or mass lesion/mass effect. Sequela of mild chronic microvascular ischemic change. Vascular: No hyperdense vessel or unexpected calcification. Skull: Normal. Negative for fracture or focal lesion. Sinuses/Orbits: No middle ear or mastoid effusion. Paranasal sinuses are clear. Bilateral lens replacement. Orbits are otherwise unremarkable. Other: None. IMPRESSION: No acute intracranial abnormality. Electronically Signed   By: Lorenza Cambridge M.D.   On: 04/23/2023 12:24   DG Chest Portable 1 View  Result Date: 04/23/2023 CLINICAL DATA:  Dizziness.  Infection EXAM: PORTABLE CHEST 1 VIEW COMPARISON:  X-ray 08/07/2022 FINDINGS: Film is rotated to the right. Tortuous and ectatic aorta. Normal cardiopericardial silhouette. Presumed hiatal hernia. No consolidation, pneumothorax or effusion. No edema. Overlapping cardiac leads. Degenerative changes seen of the shoulders. IMPRESSION: Rotated radiograph.  Hiatal hernia. No consolidation or pneumothorax. Electronically Signed   By: Karen Kays M.D.   On: 04/23/2023 11:30      Signature  -   Susa Raring M.D on 04/24/2023 at 9:54 AM   -  To page go to [email or web address]

## 2023-04-24 NOTE — Progress Notes (Signed)
Mobility Specialist - Progress Note   04/24/23 1459  Mobility  Activity Moved into chair position in bed  Assistive Device None  Activity Response Tolerated well  Mobility Referral Yes  $Mobility charge 1 Mobility   Pt was received in bed and needing assistance with food try. Pt refused getting up to chair to eat but was agreeable to be moved in to chair position to eat. Pt was left in bed with all needs met.   Alda Lea  Mobility Specialist Please contact via Special educational needs teacher or Rehab office at [phone]

## 2023-04-24 NOTE — Interval H&P Note (Signed)
History and Physical Interval Note: No acute events overnight. Good response to 2U pRBC transfusion, with H/H 9.5/28 today. Labs c/w IDA, with ferritin 8, iron 16, sat 4%, TIBC 419. Normal B12/folate. Xarelto on hold.   Plan for EGD today for diagnostic and therapeutic intent.    04/24/2023 10:22 AM  [name]  has presented today for surgery, with the diagnosis of anemia.  The various methods of treatment have been discussed with the patient and family. After consideration of risks, benefits and other options for treatment, the patient has consented to  Procedure(s): ESOPHAGOGASTRODUODENOSCOPY (EGD) (N/A) as a surgical intervention.  The patient's history has been reviewed, patient examined, no change in status, stable for surgery.  I have reviewed the patient's chart and labs.  Questions were answered to the patient's satisfaction.     Verlin Dike Ralene Gasparyan

## 2023-04-24 NOTE — Progress Notes (Signed)
  Transition of Care [facility]) Screening Note   Patient Details  Name: [name] Date of Birth: [birth year]   Transition of Care [facility]) CM/SW Contact:    Mearl Latin, LCSW Phone Number: 04/24/2023, 2:02 PM    Transition of Care Department [facility]) has reviewed patient and will follow for home health consult. We will continue to monitor patient advancement through interdisciplinary progression rounds. If new patient transition needs arise, please place a TOC consult.

## 2023-04-24 NOTE — Telephone Encounter (Signed)
Patient's niece returned call. She has been admitted to the hospital with low hemoglobin. Niece Elnita Maxwell would like a call back to discuss the magnesium. Cheryl's number is [phone].

## 2023-04-24 NOTE — Anesthesia Preprocedure Evaluation (Signed)
Anesthesia Evaluation  Patient identified by MRN, date of birth, ID band Patient awake    Reviewed: Allergy & Precautions, H&P , NPO status , Patient's Chart, lab work & pertinent test results  Airway Mallampati: II  TM Distance: >3 FB Neck ROM: Full    Dental no notable dental hx.    Pulmonary neg pulmonary ROS   Pulmonary exam normal breath sounds clear to auscultation       Cardiovascular hypertension, Pt. on medications + CAD  Normal cardiovascular exam Rhythm:Regular Rate:Normal     Neuro/Psych  Headaches      Dementia  negative psych ROS   GI/Hepatic Neg liver ROS,GERD  ,,  Endo/Other  negative endocrine ROS    Renal/GU Renal InsufficiencyRenal disease  negative genitourinary   Musculoskeletal  (+) Arthritis , Osteoarthritis,    Abdominal   Peds negative pediatric ROS (+)  Hematology  (+) Blood dyscrasia, anemia   Anesthesia Other Findings   Reproductive/Obstetrics negative OB ROS                             Anesthesia Physical Anesthesia Plan  ASA: 3  Anesthesia Plan: MAC   Post-op Pain Management: Minimal or no pain anticipated   Induction: Intravenous  PONV Risk Score and Plan: 2 and Propofol infusion and Treatment may vary due to age or medical condition  Airway Management Planned: Nasal Cannula  Additional Equipment:   Intra-op Plan:   Post-operative Plan:   Informed Consent: I have reviewed the patients History and Physical, chart, labs and discussed the procedure including the risks, benefits and alternatives for the proposed anesthesia with the patient or authorized representative who has indicated his/her understanding and acceptance.     Dental advisory given  Plan Discussed with: CRNA  Anesthesia Plan Comments:        Anesthesia Quick Evaluation

## 2023-04-24 NOTE — Evaluation (Signed)
Physical Therapy Evaluation Patient Details Name: [name] MRN: [id number] DOB: 10-15-1933 Today's Date: 04/24/2023  History of Present Illness  Pt is an 89 y/o female admitted 4/29 for symptomatic anemia in setting of likely GI bleed. Pending EGD 4/30. PMH: anemia, dementia, CAD, GERD, CKD, DVT, Barrett's esophagus, HSV, HTN, migraine, osteoporosis, diverticulosis.  Clinical Impression  Pt presents today for anemia, tolerating today's session well, but with generalized weakness and decreased activity tolerance. Pt reports ambulating with a SPC at baseline, declining any falls. Today, pt required minG for mobility with use of RW, ambulating briefly in the room as pt reports ambulating around her home which isn't too big. Pt will continue to benefit from skilled acute PT to progress ambulation, balance, and activity tolerance, recommend HHPT upon discharge, pt and niece report feeling comfortable with pt returning home at that time. PT will continue to follow as appropriate.      Recommendations for follow up therapy are one component of a multi-disciplinary discharge planning process, led by the attending physician.  Recommendations may be updated based on patient status, additional functional criteria and insurance authorization.  Follow Up Recommendations       Assistance Recommended at Discharge Intermittent Supervision/Assistance  Patient can return home with the following  A little help with walking and/or transfers;Help with stairs or ramp for entrance;Assistance with cooking/housework    Equipment Recommendations None recommended by PT  Recommendations for Other Services       Functional Status Assessment Patient has had a recent decline in their functional status and demonstrates the ability to make significant improvements in function in a reasonable and predictable amount of time.     Precautions / Restrictions Precautions Precautions: Fall Restrictions Weight Bearing  Restrictions: No      Mobility  Bed Mobility Overal bed mobility: Needs Assistance Bed Mobility: Supine to Sit, Sit to Supine     Supine to sit: HOB elevated, Min guard Sit to supine: Min guard, HOB elevated   General bed mobility comments: increased time but pt completing with elevated HOB and use of bed rail, close guard for safety    Transfers Overall transfer level: Needs assistance Equipment used: Rolling walker (2 wheels) Transfers: Sit to/from Stand Sit to Stand: Min guard           General transfer comment: standing with minG for safety and balance, cueing for pushing from bed    Ambulation/Gait Ambulation/Gait assistance: Min guard Gait Distance (Feet): 15 Feet Assistive device: Rolling walker (2 wheels) Gait Pattern/deviations: Step-through pattern, Trunk flexed, Decreased stride length, Decreased dorsiflexion - right, Decreased dorsiflexion - left Gait velocity: decreased     General Gait Details: increased trunk flexion with downward gaze, elevated shoulder, decreased step length. Cueing for upright posture but seems to be close to pt's baseline  Careers information officer    Modified Rankin (Stroke Patients Only)       Balance Overall balance assessment: Needs assistance Sitting-balance support: No upper extremity supported, Feet supported Sitting balance-Leahy Scale: Good     Standing balance support: Bilateral upper extremity supported, During functional activity, Reliant on assistive device for balance Standing balance-Leahy Scale: Poor Standing balance comment: reliant on RW for ambulation today                             Pertinent Vitals/Pain Pain Assessment Pain Assessment: No/denies pain    Home  Living Family/patient expects to be discharged to:: Private residence Living Arrangements: Alone Available Help at Discharge: Family;Personal care attendant;Available PRN/intermittently;Friend(s) Type of Home:  House Home Access: Stairs to enter Entrance Stairs-Rails: None Entrance Stairs-Number of Steps: 1   Home Layout: One level Home Equipment: Agricultural consultant (2 wheels);Cane - single point;Shower seat      Prior Function Prior Level of Function : Needs assist             Mobility Comments: SPC for ambulation at baseline, denies any falls       Hand Dominance   Dominant Hand: Right    Extremity/Trunk Assessment   Upper Extremity Assessment Upper Extremity Assessment: Defer to OT evaluation    Lower Extremity Assessment Lower Extremity Assessment: Generalized weakness    Cervical / Trunk Assessment Cervical / Trunk Assessment: Kyphotic  Communication   Communication: No difficulties  Cognition Arousal/Alertness: Awake/alert Behavior During Therapy: WFL for tasks assessed/performed Overall Cognitive Status: Within Functional Limits for tasks assessed                                 General Comments: A&Ox4, recognizes family and follows commands well, niece entering during session without concern for change in cognition        General Comments General comments (skin integrity, edema, etc.): VSS on room air    Exercises     Assessment/Plan    PT Assessment Patient needs continued PT services  PT Problem List Decreased strength;Decreased activity tolerance;Decreased balance;Decreased mobility       PT Treatment Interventions DME instruction;Gait training;Stair training;Functional mobility training;Therapeutic activities;Therapeutic exercise;Balance training;Patient/family education;Neuromuscular re-education    PT Goals (Current goals can be found in the Care Plan section)  Acute Rehab PT Goals Patient Stated Goal: get better and go home PT Goal Formulation: With patient Time For Goal Achievement: 05/08/23 Potential to Achieve Goals: Good    Frequency Min 3X/week     Co-evaluation               AM-PAC PT "6 Clicks" Mobility   Outcome Measure Help needed turning from your back to your side while in a flat bed without using bedrails?: A Little Help needed moving from lying on your back to sitting on the side of a flat bed without using bedrails?: A Little Help needed moving to and from a bed to a chair (including a wheelchair)?: A Little Help needed standing up from a chair using your arms (e.g., wheelchair or bedside chair)?: A Little Help needed to walk in hospital room?: A Lot Help needed climbing 3-5 steps with a railing? : A Lot 6 Click Score: 16    End of Session Equipment Utilized During Treatment: Gait belt Activity Tolerance: Patient tolerated treatment well Patient left: in bed;with call bell/phone within reach;Other (comment) (unable to set bed alarm due to low weight) Nurse Communication: Mobility status PT Visit Diagnosis: Muscle weakness (generalized) (M62.81);Difficulty in walking, not elsewhere classified (R26.2)    Time: 1610-9604 PT Time Calculation (min) (ACUTE ONLY): 13 min   Charges:   PT Evaluation $PT Eval Low Complexity: 1 Low          Lindalou Hose, PT DPT Acute Rehabilitation Services Office [phone]   Leonie Man 04/24/2023, 1:34 PM

## 2023-04-24 NOTE — Progress Notes (Signed)
Mobility Specialist - Progress Note   04/24/23 1035  Mobility  Activity Off unit   Pt currently off unit. Will continue to follow.   Alda Lea  Mobility Specialist Please contact via Special educational needs teacher or Rehab office at [phone]

## 2023-04-24 NOTE — Transfer of Care (Addendum)
Immediate Anesthesia Transfer of Care Note  Patient: [name]  Procedure(s) Performed: ESOPHAGOGASTRODUODENOSCOPY (EGD) BIOPSY  Patient Location: Endoscopy Unit  Anesthesia Type:MAC  Level of Consciousness: drowsy and patient cooperative  Airway & Oxygen Therapy: Patient Spontanous Breathing and Patient connected to nasal cannula oxygen  Post-op Assessment: Report given to RN, Post -op Vital signs reviewed and stable, and Patient moving all extremities X 4  Post vital signs: Reviewed and stable  Last Vitals:  Vitals Value Taken Time  BP 132/74   Temp    Pulse 94   Resp 14   SpO2 99     Last Pain:  Vitals:   04/24/23 0944  TempSrc: Oral  PainSc:          Complications: No notable events documented.

## 2023-04-24 NOTE — Anesthesia Postprocedure Evaluation (Signed)
Anesthesia Post Note  Patient: [name]  Procedure(s) Performed: ESOPHAGOGASTRODUODENOSCOPY (EGD) BIOPSY     Patient location during evaluation: PACU Anesthesia Type: MAC Level of consciousness: awake and alert Pain management: pain level controlled Vital Signs Assessment: post-procedure vital signs reviewed and stable Respiratory status: spontaneous breathing, nonlabored ventilation and respiratory function stable Cardiovascular status: blood pressure returned to baseline and stable Postop Assessment: no apparent nausea or vomiting Anesthetic complications: no   No notable events documented.  Last Vitals:  Vitals:   04/24/23 1110 04/24/23 1130  BP: (!) 102/59 131/76  Pulse: 89 80  Resp: (!) 25 15  Temp:    SpO2: 97% 96%    Last Pain:  Vitals:   04/24/23 1130  TempSrc:   PainSc: 0-No pain                 Lowella Curb

## 2023-04-24 NOTE — Evaluation (Signed)
Occupational Therapy Evaluation Patient Details Name: [name] MRN: [id number] DOB: 09/18/1933 Today's Date: 04/24/2023   History of Present Illness Pt is an 89 y/o female admitted for symptomatic anemia in setting of likely GI bleed. Pending EGD 4/30. PMH: anemia, dementia, CAD, GERD, CKD, DVT, Barrett's esophagus, HSV, HTN, migraine, osteoporosis, diverticulosis.   Clinical Impression   PTA, pt lives alone and typically Modified Independent with majority of ADLs and mobility using cane. Pt reports assist for IADLs from family and aide (7 days/2 hours). Pt presents now with minor deficits in strength, endurance and dynamic standing balance. Pt able to manage transfer w/ minguard and handheld assist provided for mobility (RW in room too tall for pt). Pt requires Supervision for UB ADLs and Min-Mod A for LB ADL mgmt. Pt reports her aide has provided assist for showers and can likely assist w/ other LB ADLs. Pending progress acutely and confirmation of needed support, pt may benefit from follow up therapy in home setting.       Recommendations for follow up therapy are one component of a multi-disciplinary discharge planning process, led by the attending physician.  Recommendations may be updated based on patient status, additional functional criteria and insurance authorization.   Assistance Recommended at Discharge Intermittent Supervision/Assistance  Patient can return home with the following A little help with bathing/dressing/bathroom;Assistance with cooking/housework;Assist for transportation    Functional Status Assessment  Patient has had a recent decline in their functional status and demonstrates the ability to make significant improvements in function in a reasonable and predictable amount of time.  Equipment Recommendations  BSC/3in1    Recommendations for Other Services       Precautions / Restrictions Precautions Precautions: Fall Restrictions Weight Bearing Restrictions:  No      Mobility Bed Mobility Overal bed mobility: Needs Assistance Bed Mobility: Supine to Sit, Sit to Supine     Supine to sit: Min assist Sit to supine: Min assist   General bed mobility comments: assist to lift trunk and lift LE back onto bed    Transfers Overall transfer level: Needs assistance Equipment used: 1 person hand held assist Transfers: Sit to/from Stand Sit to Stand: Min guard           General transfer comment: able to stand without assist though did endorse feeling more comfortable with handheld assist prior to mobilizing      Balance Overall balance assessment: Needs assistance Sitting-balance support: No upper extremity supported, Feet supported Sitting balance-Leahy Scale: Good     Standing balance support: No upper extremity supported, Single extremity supported, During functional activity Standing balance-Leahy Scale: Fair                             ADL either performed or assessed with clinical judgement   ADL Overall ADL's : Needs assistance/impaired Eating/Feeding: Independent Eating/Feeding Details (indicate cue type and reason): though NPO at time of eval Grooming: Supervision/safety;Sitting   Upper Body Bathing: Supervision/ safety;Sitting   Lower Body Bathing: Minimal assistance;Sit to/from stand Lower Body Bathing Details (indicate cue type and reason): assist for posterior region in standing. pt able to bathe lower LE seated and anterior region standing holding to sink Upper Body Dressing : Supervision/safety;Sitting   Lower Body Dressing: Moderate assistance;Sit to/from stand;Sitting/lateral leans Lower Body Dressing Details (indicate cue type and reason): assist for underwear around feet and posterior region (primarily due to urinary incontinence and lines). able to stand without BUE support  in attempts to pull up around waist Toilet Transfer: Minimal assistance;Min guard;Ambulation   Toileting- Clothing  Manipulation and Hygiene: Minimal assistance;Sitting/lateral lean;Sit to/from stand         General ADL Comments: Provided handheld assist for mobility w/ minor balance issues noted though no overt LOB w/ pt cautious during all standing tasks.     Vision Baseline Vision/History: 1 Wears glasses Ability to See in Adequate Light: 1 Impaired Patient Visual Report: No change from baseline Vision Assessment?: No apparent visual deficits     Perception     Praxis      Pertinent Vitals/Pain Pain Assessment Pain Assessment: No/denies pain     Hand Dominance Right   Extremity/Trunk Assessment Upper Extremity Assessment Upper Extremity Assessment: Generalized weakness   Lower Extremity Assessment Lower Extremity Assessment: Defer to PT evaluation   Cervical / Trunk Assessment Cervical / Trunk Assessment: Kyphotic   Communication Communication Communication: No difficulties   Cognition Arousal/Alertness: Awake/alert Behavior During Therapy: WFL for tasks assessed/performed Overall Cognitive Status: No family/caregiver present to determine baseline cognitive functioning                                 General Comments: appears WFL; shows insight into safety (requested bedrail up because "i dont want to fall out of bed") and reports being careful with mobility around the home. follows directions well and shows some insight into situation (planned EGD, reported she had one in the past due to a bleed)     General Comments       Exercises     Shoulder Instructions      Home Living Family/patient expects to be discharged to:: Private residence Living Arrangements: Alone Available Help at Discharge: Family;Personal care attendant;Available PRN/intermittently;Friend(s) Type of Home: House Home Access: Stairs to enter [employer] of Steps: 1   Home Layout: One level     Bathroom Shower/Tub: Producer, television/film/video: Standard     Home  Equipment: Agricultural consultant (2 wheels);Cane - single point;Shower seat          Prior Functioning/Environment Prior Level of Function : Needs assist             Mobility Comments: uses cane for mobility, denies any recent falls ADLs Comments: has daily aide assist for 2 hours per pt. reports aide assists with showering tasks and light IADLs around the house. has "mobile meals" and support in the community for transportation and other meals. reports able to go to bathroom and manage ADLs without assist (sponge bathing if needed)        OT Problem List: Decreased strength;Decreased activity tolerance;Impaired balance (sitting and/or standing);Decreased knowledge of use of DME or AE      OT Treatment/Interventions: Self-care/ADL training;Therapeutic exercise;Energy conservation;DME and/or AE instruction;Therapeutic activities;Patient/family education;Balance training    OT Goals(Current goals can be found in the care plan section) Acute Rehab OT Goals Patient Stated Goal: stay in my home OT Goal Formulation: With patient Time For Goal Achievement: 05/01/23 Potential to Achieve Goals: Good  OT Frequency: Min 2X/week    Co-evaluation              AM-PAC OT "6 Clicks" Daily Activity     Outcome Measure Help from another person eating meals?: None Help from another person taking care of personal grooming?: A Little Help from another person toileting, which includes using toliet, bedpan, or urinal?: A Little Help from  another person bathing (including washing, rinsing, drying)?: A Little Help from another person to put on and taking off regular upper body clothing?: A Little Help from another person to put on and taking off regular lower body clothing?: A Lot 6 Click Score: 18   End of Session Equipment Utilized During Treatment: Gait belt Nurse Communication: Mobility status (discussed with NT)  Activity Tolerance: Patient tolerated treatment well Patient left: in bed;with  call bell/phone within reach;Other (comment) (per bed, pt weight too low to set bed alarm - NT aware)  OT Visit Diagnosis: Unsteadiness on feet (R26.81);Other abnormalities of gait and mobility (R26.89);Muscle weakness (generalized) (M62.81)                Time: 1610-9604 OT Time Calculation (min): 30 min Charges:  OT General Charges $OT Visit: 1 Visit OT Evaluation $OT Eval Low Complexity: 1 Low OT Treatments $Self Care/Home Management : 8-22 mins  Bradd Canary, OTR/L Acute Rehab Services Office: [phone]   Lorre Munroe 04/24/2023, 8:10 AM

## 2023-04-25 ENCOUNTER — Inpatient Hospital Stay (HOSPITAL_COMMUNITY): Payer: Medicare Other

## 2023-04-25 DIAGNOSIS — K5900 Constipation, unspecified: Secondary | ICD-10-CM

## 2023-04-25 DIAGNOSIS — Z86718 Personal history of other venous thrombosis and embolism: Secondary | ICD-10-CM | POA: Diagnosis not present

## 2023-04-25 DIAGNOSIS — K5710 Diverticulosis of small intestine without perforation or abscess without bleeding: Secondary | ICD-10-CM | POA: Diagnosis not present

## 2023-04-25 DIAGNOSIS — K2100 Gastro-esophageal reflux disease with esophagitis, without bleeding: Secondary | ICD-10-CM | POA: Diagnosis not present

## 2023-04-25 DIAGNOSIS — I82409 Acute embolism and thrombosis of unspecified deep veins of unspecified lower extremity: Secondary | ICD-10-CM

## 2023-04-25 DIAGNOSIS — K449 Diaphragmatic hernia without obstruction or gangrene: Secondary | ICD-10-CM | POA: Diagnosis not present

## 2023-04-25 DIAGNOSIS — K2950 Unspecified chronic gastritis without bleeding: Secondary | ICD-10-CM | POA: Diagnosis not present

## 2023-04-25 DIAGNOSIS — D649 Anemia, unspecified: Secondary | ICD-10-CM | POA: Diagnosis not present

## 2023-04-25 LAB — CBC WITH DIFFERENTIAL/PLATELET
Abs Immature Granulocytes: 0.01 10*3/uL (ref 0.00–0.07)
Basophils Absolute: 0 10*3/uL (ref 0.0–0.1)
Basophils Relative: 1 %
Eosinophils Absolute: 0.2 10*3/uL (ref 0.0–0.5)
Eosinophils Relative: 3 %
HCT: 27.7 % — ABNORMAL LOW (ref 36.0–46.0)
Hemoglobin: 9.1 g/dL — ABNORMAL LOW (ref 12.0–15.0)
Immature Granulocytes: 0 %
Lymphocytes Relative: 10 %
Lymphs Abs: 0.5 10*3/uL — ABNORMAL LOW (ref 0.7–4.0)
MCH: 27.7 pg (ref 26.0–34.0)
MCHC: 32.9 g/dL (ref 30.0–36.0)
MCV: 84.2 fL (ref 80.0–100.0)
Monocytes Absolute: 0.7 10*3/uL (ref 0.1–1.0)
Monocytes Relative: 14 %
Neutro Abs: 3.6 10*3/uL (ref 1.7–7.7)
Neutrophils Relative %: 72 %
Platelets: 388 10*3/uL (ref 150–400)
RBC: 3.29 MIL/uL — ABNORMAL LOW (ref 3.87–5.11)
RDW: 16.2 % — ABNORMAL HIGH (ref 11.5–15.5)
WBC: 5.1 10*3/uL (ref 4.0–10.5)
nRBC: 0 % (ref 0.0–0.2)

## 2023-04-25 LAB — BASIC METABOLIC PANEL
Anion gap: 8 (ref 5–15)
BUN: 15 mg/dL (ref 8–23)
CO2: 26 mmol/L (ref 22–32)
Calcium: 8.7 mg/dL — ABNORMAL LOW (ref 8.9–10.3)
Chloride: 108 mmol/L (ref 98–111)
Creatinine, Ser: 0.95 mg/dL (ref 0.44–1.00)
GFR, Estimated: 58 mL/min — ABNORMAL LOW (ref >60)
Glucose, Bld: 121 mg/dL — ABNORMAL HIGH (ref 70–99)
Potassium: 4 mmol/L (ref 3.5–5.1)
Sodium: 142 mmol/L (ref 135–145)

## 2023-04-25 LAB — SURGICAL PATHOLOGY

## 2023-04-25 LAB — MAGNESIUM: Magnesium: 2.1 mg/dL (ref 1.7–2.4)

## 2023-04-25 MED ORDER — FAMOTIDINE 20 MG PO TABS
20.0000 mg | ORAL_TABLET | Freq: Every day | ORAL | Status: DC
  Administered 2023-04-25 – 2023-04-27 (×3): 20 mg via ORAL
  Filled 2023-04-25 (×3): qty 1

## 2023-04-25 MED ORDER — ENOXAPARIN SODIUM 40 MG/0.4ML IJ SOSY
40.0000 mg | PREFILLED_SYRINGE | INTRAMUSCULAR | Status: DC
  Administered 2023-04-25 – 2023-04-26 (×2): 40 mg via SUBCUTANEOUS
  Filled 2023-04-25 (×2): qty 0.4

## 2023-04-25 MED ORDER — IOHEXOL 350 MG/ML SOLN
50.0000 mL | Freq: Once | INTRAVENOUS | Status: AC | PRN
  Administered 2023-04-25: 50 mL via INTRAVENOUS

## 2023-04-25 MED ORDER — TRAZODONE HCL 50 MG PO TABS
50.0000 mg | ORAL_TABLET | Freq: Every evening | ORAL | Status: DC | PRN
  Administered 2023-04-25: 50 mg via ORAL
  Filled 2023-04-25: qty 1

## 2023-04-25 NOTE — Progress Notes (Addendum)
Attending physician's note   I have taken a history, reviewed the chart, and examined the patient. I performed a substantive portion of this encounter, including complete performance of at least one of the key components, in conjunction with the APP. I agree with the APP's note, impression, and recommendations with my edits.   CT chest performed earlier today and notable for a very large hiatal hernia with paraesophageal component.  The proximal stomach is in the mediastinum, gastric body extending into the abdomen, but distal stomach extends back up into the mediastinum before passing down at the pylorus.  This is certainly a very complex hernia, and undoubtedly contributing to some of her UGI symptomatology, to include feeling of uncontrolled reflux, regurgitation, etc.  Additionally, there is a large proximal duodenal diverticulum, which was seen on endoscopy yesterday.  No evidence of mediastinal mass or thrombus.  We discussed the CT findings at length.  This would be a complex surgery, and at 89 years old, she is appropriately quite hesitant to pursue a surgical approach.  Plan to optimize medical management. - Continue high-dose PPI - Can add Pepcid 20 mg qhs and on demand - Continue MiraLAX and titrate to a goal of soft stools without straining to have BM - Ok to resume anticoagulation - If not already done, please give IV iron prior to hospital discharge - Repeat CBC check 7-10 days after hospital discharge to ensure returning to baseline baseline ~10-11 as outpatient) - Inpatient GI service will sign off at this time.  Please do not hesitate to contact us with additional questions or concerns  Karinda Cabriales, DO, FACG [phone] office          Progress Note  Primary GI: Dr. Adela Lank  LOS: 1 day   Chief Complaint:symptomatic anemia   Subjective   Patient states she is doing well this morning. Has been taking miralax and having bowel movements. Reports they are  still "ball shaped" and somewhat dark. She isn't entirely sure the color. Denies abdominal pain, nausea, and vomiting.  No family was present at the time of my evaluation.   Objective   Vital signs in last 24 hours: Temp:  [97.6 F (36.4 C)-99 F (37.2 C)] 98 F (36.7 C) (05/01 0553) Pulse Rate:  [91-107] 96 (05/01 1156) Resp:  [17-20] 19 (05/01 1156) BP: (105-154)/(54-85) 141/85 (05/01 1156) SpO2:  [94 %-95 %] 95 % (05/01 0008) Last BM Date : 04/24/23 Last BM recorded by nurses in past 5 days Stool Type: Type 1 (Separate hard lumps) (04/25/2023 11:00 AM)  General:   female in no acute distress  Heart:  Regular rate and rhythm; no murmurs Pulm: Clear anteriorly; no wheezing Abdomen: soft, nondistended, normal bowel sounds in all quadrants. Nontender without guarding. No organomegaly appreciated. Extremities:  No edema Neurologic:  Alert and  oriented x4;  No focal deficits.  Psych:  Cooperative. Normal mood and affect.  Intake/Output from previous day: 04/30 0701 - 05/01 0700 In: 12.5 [I.V.:12.5] Out: 600 [Urine:600] Intake/Output this shift: Total I/O In: -  Out: 1 [Stool:1]  Studies/Results: CT CHEST W CONTRAST  Result Date: 04/25/2023 CLINICAL DATA:  Hemoptysis. History of SMV thrombus and mediastinal mass. EXAM: CT CHEST WITH CONTRAST TECHNIQUE: Multidetector CT imaging of the chest was performed during intravenous contrast administration. RADIATION DOSE REDUCTION: This exam was performed according to the departmental dose-optimization program which includes automated exposure control, adjustment of the mA and/or kV according to patient size and/or use of iterative reconstruction  technique. CONTRAST:  50mL OMNIPAQUE IOHEXOL 350 MG/ML SOLN COMPARISON:  X-ray 04/23/2023 FINDINGS: Cardiovascular: Heart is nonenlarged. Coronary artery calcifications are seen. Small pericardial effusion. The thoracic aorta has a normal course and caliber with scattered calcified atherosclerotic  plaque. Mediastinum/Nodes: There is a very large hiatal hernia. This a complex hernia with a paraesophageal component. The proximal stomach is in the mediastinum. The body extends into the abdomen in than the distal stomach extends back up into the mediastinum before passing back down at the pylorus. The esophagus is dilated, fluid-filled up to the level of the thoracic inlet. There is some wall thickening of the pylorus. Please correlate with symptoms. The herniated stomach is distended with fluid and debris. Please correlate with gastric emptying. No specific abnormal lymph node enlargement identified in the axillary region, hilum or mediastinum. Heterogeneous thyroid. Lungs/Pleura: Extensive breathing motion throughout the examination. There is some basilar dependent atelectasis. Few areas of bronchiectasis along the lung bases. Otherwise no consolidation, pneumothorax or effusion. Upper Abdomen: Adrenal glands are preserved in the visualized abdomen. Central hepatic benign-appearing cyst. Gallbladder is nondilated. Diffuse colonic stool with diverticula. Large proximal duodenal diverticulum. Musculoskeletal: Moderate levoconvex curvature of the thoracolumbar spine. Advanced multilevel degenerative changes. Advanced degenerative changes of the shoulders. IMPRESSION: Very large mixed type paraesophageal and hiatal hernia. There is wall thickening as well of the the pylorus. There is significant fluid and debris in the stomach. Please correlate for gastric emptying. In addition there is a dilated esophagus with luminal fluid and debris extending up to the thoracic inlet. Aortic Atherosclerosis (ICD10-I70.0). Electronically Signed   By: Karen Kays M.D.   On: 04/25/2023 10:10   CT Head Wo Contrast  Result Date: 04/23/2023 CLINICAL DATA:  Altered mental status EXAM: CT HEAD WITHOUT CONTRAST TECHNIQUE: Contiguous axial images were obtained from the base of the skull through the vertex without intravenous  contrast. RADIATION DOSE REDUCTION: This exam was performed according to the departmental dose-optimization program which includes automated exposure control, adjustment of the mA and/or kV according to patient size and/or use of iterative reconstruction technique. COMPARISON:  CT head 06/15/10 FINDINGS: Brain: No evidence of acute infarction, hemorrhage, hydrocephalus, extra-axial collection or mass lesion/mass effect. Sequela of mild chronic microvascular ischemic change. Vascular: No hyperdense vessel or unexpected calcification. Skull: Normal. Negative for fracture or focal lesion. Sinuses/Orbits: No middle ear or mastoid effusion. Paranasal sinuses are clear. Bilateral lens replacement. Orbits are otherwise unremarkable. Other: None. IMPRESSION: No acute intracranial abnormality. Electronically Signed   By: Lorenza Cambridge M.D.   On: 04/23/2023 12:24    Lab Results: Recent Labs    04/24/23 0556 04/24/23 1550 04/25/23 0608  WBC 4.6 4.3 5.1  HGB 9.5* 10.0* 9.1*  HCT 28.4* 30.8* 27.7*  PLT 409* 424* 388   BMET Recent Labs    04/23/23 1104 04/23/23 1150 04/24/23 0556 04/25/23 0608  NA 143 142 141 142  K 3.6 3.6 3.5 4.0  CL 106  --  107 108  CO2 28  --  26 26  GLUCOSE 107*  --  104* 121*  BUN 14  --  8 15  CREATININE 0.88  --  0.79 0.95  CALCIUM 8.5*  --  8.8* 8.7*   LFT Recent Labs    04/23/23 1104  PROT 5.6*  ALBUMIN 2.8*  AST 15  ALT 13  ALKPHOS 44  BILITOT 0.4   PT/INR Recent Labs    04/23/23 1225  LABPROT 15.6*  INR 1.3*     Scheduled Meds:  [  START ON 04/27/2023] pantoprazole  40 mg Intravenous Q12H   polyethylene glycol  17 g Oral BID   sodium chloride flush  3 mL Intravenous Q12H   Continuous Infusions:    Patient profile:   89 y.o. 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     Impression:   Symptomatic anemia Hiatal hernia GERD with erosive esophagitis Short segment nondysplastic Barrett's esophagus - hgb hgb 9.1, stable - BUN 15, Cr. 0.95 - Fecal occult negative - Iron 16, ferritin 8 -EGD 4/30: Single medium sized bleb versus single downhill varix in upper third of esophagus, LA grade C esophagitis with no bleeding, large hiatal hernia (no Cameron erosions noted).  Gastritis (biopsied).  Small nonbleeding diverticulum in second portion of duodenum.  Biopsies pending -CT chest with contrast: Very large mixed type paraesophageal and hiatal hernia.  Wall thickening of pylorus.  Significant fluid and debris in stomach.  Dilated esophagus with luminal fluid and debris extending up to the thoracic inlet Stable hgb with negative fecal occult and no overt bleeding. Hesitant towards proceeding with colonoscopy at this time. Maybe continue to monitor cbc and see how hgb does once blood thinner is restarted and if significant drop in hgb overt bleeding can reconsider colonoscopy.  Constipation  - possibly worsened from being sedentary since DVT. Longstanding history with stercoral ulcers on last colonoscopy 2011. - continue miralax 17 g BID   CAD DVT - can resume xarelto from GI standpoint  Hypertension Dementia - alert and oriented to place, time, person.    Plan:   - await biopsies - continue PPI 40mg  BID - Continue miralax 17g BID - continue to encourage ambulation to help with constipation - Continue daily CBC and transfuse as needed to maintain HGB > 7     Bayley M McMichael  04/25/2023, 12:10 PM

## 2023-04-25 NOTE — Progress Notes (Signed)
PROGRESS NOTE                                                                                                                                                                                                             Patient Demographics:    [name], is a 89 y.o. female, DOB - [birth year], ZOX:[id number]  Outpatient Primary MD for the patient is Etta Grandchild, MD    LOS - 1  Admit date - 04/23/2023    Chief Complaint  Patient presents with   Altered Mental Status       Brief Narrative (HPI from H&P)    89 y.o. female with medical history significant of dementia, CKD 3B, GERD, Barrett's esophagitis, GI bleed, hypertension, CAD, anemia, DVT, migraines, glaucoma presenting with dizziness and confusion.  Patient has known history of upper GI bleed workup in the ER consistent with severe anemia and she was admitted for further workup.   Subjective:    [name] today denies any nausea, vomiting, tolerating oral intake.      Assessment  & Plan :   Symptomatic anemia in a patient with history of Barrett's esophagus and previous UGI bleed, also on Xarelto. -Hemoglobin low at 5.  5 on admission, required 2 units PRBC transfusion, hemoglobin remained stable, monitor and transfuse as needed. -GI input greatly appreciated, endoscopy significant for gastritis, esophagitis. -CT chest with IV contrast significant for very large mixed type paraesophageal and hiatal hernia laded esophagus with luminal fluid and debris extending up to the thoracic inlet  dementia  -   Noted,  Delirium precautions   CKD 3B > Stable     Hypertension  - Holding home antihypertensives in the setting of dizziness and lightheadedness with significant suspected bleeding and symptomatic anemia as above.   CAD - Holding home metoprolol, Xarelto   History of DVT - Holding home Xarelto, note she had bilateral lower extremity DVT in August of last  year. -Recheck venous Doppler, and if no evidence of DVT, likely will hold on resuming anticoagulation   Glaucoma - - Continue home eyedrops         Condition - Extremely Guarded  Family Communication  : Niece Elnita Maxwell [phone]  on 04/24/2023 at 9:57 AM  Code Status : Full code  Consults  : GI  PUD Prophylaxis : PPI  Procedures  :     EGD      Disposition Plan  :    Status is: Observation   DVT Prophylaxis  :    Place and maintain sequential compression device Start: 04/24/23 1006 SCDs Start: 04/23/23 1356     Lab Results  Component Value Date   PLT 388 04/25/2023    Diet :  Diet Order             DIET SOFT Room service appropriate? No; Fluid consistency: Thin  Diet effective now                    Inpatient Medications  Scheduled Meds:  [START ON 04/27/2023] pantoprazole  40 mg Intravenous Q12H   polyethylene glycol  17 g Oral BID   sodium chloride flush  3 mL Intravenous Q12H   Continuous Infusions:   PRN Meds:.acetaminophen **OR** acetaminophen, alum & mag hydroxide-simeth, melatonin  Antibiotics  :    Anti-infectives (From admission, onward)    None         Objective:   Vitals:   04/24/23 1954 04/25/23 0008 04/25/23 0553 04/25/23 1156  BP: (!) 105/54 (!) 154/79 119/67 (!) 141/85  Pulse: (!) 102 (!) 107 91 96  Resp: 18 20 17 19   Temp: 98.5 F (36.9 C) 99 F (37.2 C) 98 F (36.7 C)   TempSrc: Oral Oral    SpO2: 94% 95%    Weight:      Height:        Wt Readings from Last 3 Encounters:  04/23/23 52.6 kg  04/24/22 52.6 kg  11/07/21 53.1 kg     Intake/Output Summary (Last 24 hours) at 04/25/2023 1431 Last data filed at 04/25/2023 1100 Gross per 24 hour  Intake 12.45 ml  Output 601 ml  Net -588.55 ml     Physical Exam  Awake Alert, Oriented X 3, No new F.N deficits, Normal affect Symmetrical Chest wall movement, Good air movement bilaterally, CTAB RRR,No Gallops,Rubs or new Murmurs, No Parasternal Heave +ve  B.Sounds, Abd Soft, No tenderness, No rebound - guarding or rigidity. No Cyanosis, Clubbing or edema, No new Rash or bruise       Data Review:    Recent Labs  Lab 04/23/23 1104 04/23/23 1150 04/24/23 0556 04/24/23 1550 04/25/23 0608  WBC 5.0  --  4.6 4.3 5.1  HGB 5.5* 6.1* 9.5* 10.0* 9.1*  HCT 18.8* 18.0* 28.4* 30.8* 27.7*  PLT 456*  --  409* 424* 388  MCV 92.6  --  83.0 84.2 84.2  MCH 27.1  --  27.8 27.3 27.7  MCHC 29.3*  --  33.5 32.5 32.9  RDW 15.9*  --  17.1* 16.9* 16.2*  LYMPHSABS 0.4*  --   --   --  0.5*  MONOABS 0.5  --   --   --  0.7  EOSABS 0.2  --   --   --  0.2  BASOSABS 0.0  --   --   --  0.0    Recent Labs  Lab 04/23/23 1104 04/23/23 1150 04/23/23 1225 04/24/23 0556 04/25/23 0608  NA 143 142  --  141 142  K 3.6 3.6  --  3.5 4.0  CL 106  --   --  107 108  CO2 28  --   --  26 26  ANIONGAP 9  --   --  8 8  GLUCOSE 107*  --   --  104* 121*  BUN 14  --   --  8 15  CREATININE 0.88  --   --  0.79 0.95  AST 15  --   --   --   --   ALT 13  --   --   --   --   ALKPHOS 44  --   --   --   --   BILITOT 0.4  --   --   --   --   ALBUMIN 2.8*  --   --   --   --   INR  --   --  1.3*  --   --   MG  --   --   --  1.9 2.1  CALCIUM 8.5*  --   --  8.8* 8.7*     Radiology Reports CT CHEST W CONTRAST  Result Date: 04/25/2023 CLINICAL DATA:  Hemoptysis. History of SMV thrombus and mediastinal mass. EXAM: CT CHEST WITH CONTRAST TECHNIQUE: Multidetector CT imaging of the chest was performed during intravenous contrast administration. RADIATION DOSE REDUCTION: This exam was performed according to the departmental dose-optimization program which includes automated exposure control, adjustment of the mA and/or kV according to patient size and/or use of iterative reconstruction technique. CONTRAST:  50mL OMNIPAQUE IOHEXOL 350 MG/ML SOLN COMPARISON:  X-ray 04/23/2023 FINDINGS: Cardiovascular: Heart is nonenlarged. Coronary artery calcifications are seen. Small pericardial  effusion. The thoracic aorta has a normal course and caliber with scattered calcified atherosclerotic plaque. Mediastinum/Nodes: There is a very large hiatal hernia. This a complex hernia with a paraesophageal component. The proximal stomach is in the mediastinum. The body extends into the abdomen in than the distal stomach extends back up into the mediastinum before passing back down at the pylorus. The esophagus is dilated, fluid-filled up to the level of the thoracic inlet. There is some wall thickening of the pylorus. Please correlate with symptoms. The herniated stomach is distended with fluid and debris. Please correlate with gastric emptying. No specific abnormal lymph node enlargement identified in the axillary region, hilum or mediastinum. Heterogeneous thyroid. Lungs/Pleura: Extensive breathing motion throughout the examination. There is some basilar dependent atelectasis. Few areas of bronchiectasis along the lung bases. Otherwise no consolidation, pneumothorax or effusion. Upper Abdomen: Adrenal glands are preserved in the visualized abdomen. Central hepatic benign-appearing cyst. Gallbladder is nondilated. Diffuse colonic stool with diverticula. Large proximal duodenal diverticulum. Musculoskeletal: Moderate levoconvex curvature of the thoracolumbar spine. Advanced multilevel degenerative changes. Advanced degenerative changes of the shoulders. IMPRESSION: Very large mixed type paraesophageal and hiatal hernia. There is wall thickening as well of the the pylorus. There is significant fluid and debris in the stomach. Please correlate for gastric emptying. In addition there is a dilated esophagus with luminal fluid and debris extending up to the thoracic inlet. Aortic Atherosclerosis (ICD10-I70.0). Electronically Signed   By: Karen Kays M.D.   On: 04/25/2023 10:10   CT Head Wo Contrast  Result Date: 04/23/2023 CLINICAL DATA:  Altered mental status EXAM: CT HEAD WITHOUT CONTRAST TECHNIQUE: Contiguous  axial images were obtained from the base of the skull through the vertex without intravenous contrast. RADIATION DOSE REDUCTION: This exam was performed according to the departmental dose-optimization program which includes automated exposure control, adjustment of the mA and/or kV according to patient size and/or use of iterative reconstruction technique. COMPARISON:  CT head 06/15/10 FINDINGS: Brain: No evidence of acute infarction, hemorrhage, hydrocephalus, extra-axial collection or mass lesion/mass effect. Sequela of mild chronic microvascular ischemic change. Vascular: No hyperdense vessel or unexpected calcification. Skull: Normal. Negative for fracture or focal  lesion. Sinuses/Orbits: No middle ear or mastoid effusion. Paranasal sinuses are clear. Bilateral lens replacement. Orbits are otherwise unremarkable. Other: None. IMPRESSION: No acute intracranial abnormality. Electronically Signed   By: Lorenza Cambridge M.D.   On: 04/23/2023 12:24   DG Chest Portable 1 View  Result Date: 04/23/2023 CLINICAL DATA:  Dizziness.  Infection EXAM: PORTABLE CHEST 1 VIEW COMPARISON:  X-ray 08/07/2022 FINDINGS: Film is rotated to the right. Tortuous and ectatic aorta. Normal cardiopericardial silhouette. Presumed hiatal hernia. No consolidation, pneumothorax or effusion. No edema. Overlapping cardiac leads. Degenerative changes seen of the shoulders. IMPRESSION: Rotated radiograph.  Hiatal hernia. No consolidation or pneumothorax. Electronically Signed   By: Karen Kays M.D.   On: 04/23/2023 11:30      Signature  -   Huey Bienenstock M.D on 04/25/2023 at 2:31 PM   -  To page go to [email or web address]

## 2023-04-25 NOTE — Progress Notes (Signed)
Lower extremity venous bilateral study completed.   Please see CV Proc for preliminary results.   Laurance Heide, RDMS, RVT  

## 2023-04-25 NOTE — Progress Notes (Signed)
Mobility Specialist - Progress Note   04/25/23 1100  Mobility  Activity Ambulated with assistance in hallway  Level of Assistance Minimal assist, patient does 75% or more  Assistive Device Front wheel walker  Distance Ambulated (ft) 80 ft  Activity Response Tolerated well  Mobility Referral Yes  $Mobility charge 1 Mobility   Pt was received in bed and agreeable to mobility. Pt was MinA to stand from EOB and CG throughout session. Pt requested to use BSC when returning to room. Pt was left on [facility] with all needs met and NT made aware.  Alda Lea  Mobility Specialist Please contact via Special educational needs teacher or Rehab office at [phone]

## 2023-04-26 ENCOUNTER — Encounter (HOSPITAL_COMMUNITY): Payer: Self-pay | Admitting: Gastroenterology

## 2023-04-26 ENCOUNTER — Encounter: Payer: Self-pay | Admitting: Gastroenterology

## 2023-04-26 DIAGNOSIS — D649 Anemia, unspecified: Secondary | ICD-10-CM | POA: Diagnosis not present

## 2023-04-26 LAB — CBC WITH DIFFERENTIAL/PLATELET
Abs Immature Granulocytes: 0.02 10*3/uL (ref 0.00–0.07)
Basophils Absolute: 0.1 10*3/uL (ref 0.0–0.1)
Basophils Relative: 1 %
Eosinophils Absolute: 0.2 10*3/uL (ref 0.0–0.5)
Eosinophils Relative: 4 %
HCT: 28.9 % — ABNORMAL LOW (ref 36.0–46.0)
Hemoglobin: 9 g/dL — ABNORMAL LOW (ref 12.0–15.0)
Immature Granulocytes: 0 %
Lymphocytes Relative: 13 %
Lymphs Abs: 0.6 10*3/uL — ABNORMAL LOW (ref 0.7–4.0)
MCH: 26.9 pg (ref 26.0–34.0)
MCHC: 31.1 g/dL (ref 30.0–36.0)
MCV: 86.3 fL (ref 80.0–100.0)
Monocytes Absolute: 0.7 10*3/uL (ref 0.1–1.0)
Monocytes Relative: 14 %
Neutro Abs: 3.5 10*3/uL (ref 1.7–7.7)
Neutrophils Relative %: 68 %
Platelets: 421 10*3/uL — ABNORMAL HIGH (ref 150–400)
RBC: 3.35 MIL/uL — ABNORMAL LOW (ref 3.87–5.11)
RDW: 15.9 % — ABNORMAL HIGH (ref 11.5–15.5)
WBC: 5.1 10*3/uL (ref 4.0–10.5)
nRBC: 0 % (ref 0.0–0.2)

## 2023-04-26 LAB — BASIC METABOLIC PANEL
Anion gap: 5 (ref 5–15)
BUN: 15 mg/dL (ref 8–23)
CO2: 28 mmol/L (ref 22–32)
Calcium: 8.6 mg/dL — ABNORMAL LOW (ref 8.9–10.3)
Chloride: 109 mmol/L (ref 98–111)
Creatinine, Ser: 0.88 mg/dL (ref 0.44–1.00)
GFR, Estimated: >60 mL/min (ref >60)
Glucose, Bld: 98 mg/dL (ref 70–99)
Potassium: 3.8 mmol/L (ref 3.5–5.1)
Sodium: 142 mmol/L (ref 135–145)

## 2023-04-26 LAB — MAGNESIUM: Magnesium: 2.4 mg/dL (ref 1.7–2.4)

## 2023-04-26 MED ORDER — GABAPENTIN 300 MG PO CAPS
300.0000 mg | ORAL_CAPSULE | Freq: Once | ORAL | Status: AC
  Administered 2023-04-26: 300 mg via ORAL
  Filled 2023-04-26: qty 1

## 2023-04-26 MED ORDER — METOPROLOL TARTRATE 12.5 MG HALF TABLET
12.5000 mg | ORAL_TABLET | Freq: Once | ORAL | Status: AC
  Administered 2023-04-26: 12.5 mg via ORAL
  Filled 2023-04-26: qty 1

## 2023-04-26 MED ORDER — PANTOPRAZOLE SODIUM 40 MG PO TBEC
40.0000 mg | DELAYED_RELEASE_TABLET | Freq: Two times a day (BID) | ORAL | Status: DC
  Administered 2023-04-26 – 2023-04-27 (×2): 40 mg via ORAL
  Filled 2023-04-26 (×2): qty 1

## 2023-04-26 MED ORDER — LATANOPROST 0.005 % OP SOLN
1.0000 [drp] | Freq: Every day | OPHTHALMIC | Status: DC
  Filled 2023-04-26: qty 2.5

## 2023-04-26 MED ORDER — METOPROLOL SUCCINATE ER 25 MG PO TB24
25.0000 mg | ORAL_TABLET | Freq: Every day | ORAL | Status: DC
  Administered 2023-04-26 – 2023-04-27 (×2): 25 mg via ORAL
  Filled 2023-04-26 (×2): qty 1

## 2023-04-26 MED ORDER — GABAPENTIN 300 MG PO CAPS
300.0000 mg | ORAL_CAPSULE | Freq: Every day | ORAL | Status: DC
  Administered 2023-04-26: 300 mg via ORAL
  Filled 2023-04-26: qty 1

## 2023-04-26 NOTE — Progress Notes (Signed)
PROGRESS NOTE                                                                                                                                                                                                             Patient Demographics:    [name], is a 89 y.o. female, DOB - 21-May-1933, ZOX:[id number]  Outpatient Primary MD for the patient is Etta Grandchild, MD    LOS - 2  Admit date - 04/23/2023    Chief Complaint  Patient presents with   Altered Mental Status       Brief Narrative (HPI from H&P)     89 y.o. female with medical history significant of dementia, CKD 3B, GERD, Barrett's esophagitis, GI bleed, hypertension, CAD, anemia, DVT, migraines, glaucoma presenting with dizziness and confusion.  Patient has known history of upper GI bleed workup in the ER consistent with severe anemia and she was admitted for further workup.  Her EGD significant for erosive esophagitis, so CT chest was obtained, significant for very large hiatal hernia, likely contributing to her dyspepsia and erosive esophagitis.   Subjective:    Vinnie Bobst today denies any nausea, vomiting, tolerating oral intake.      Assessment  & Plan :   Symptomatic anemia in setting of upper GI bleed, from erosive esophagitis, gastritis, in the setting of large hiatal hernia .  With the patient on Xarelto. -patient with history of Barrett's esophagus and previous UGI bleed, also on Xarelto. -Hemoglobin low at 5.  5 on admission, required 2 units PRBC transfusion, hemoglobin remained stable, monitor and transfuse as needed. -GI input greatly appreciated, endoscopy significant for gastritis, esophagitis. -CT chest with IV contrast significant for very large mixed type paraesophageal and hiatal hernia laded esophagus with luminal fluid and debris extending up to the thoracic inlet, so her hernia contributing to some of her upper GI bleed, including severe  dyspepsia with uncontrolled reflux, regurg, causing her positive esophagitis, at her age and fidelity she will not be a surgical candidate, will continue with conservative measures including high-dose PPI, and Pepcid as needed.  dementia  - Noted,  Delirium precautions   CKD 3B > Stable     Hypertension  - Holding home antihypertensives in the setting of dizziness and lightheadedness with significant suspected bleeding and symptomatic anemia as above.  Sinus tachycardia -  Likely from holding her metoprolol,   CAD -continue with metoprolol, will resume aspirin in 2 weeks  History of DVT  -Patient was diagnosed with DVT in September 2023, for which she has been on Xarelto, this is 29-month treatment, repeat vascular Doppler this admission showing no evidence of DVT, so she has been treated appropriately for her DVT, no further need for anticoagulation.     Glaucoma - - Continue home eyedrops         Condition - Extremely Guarded  Family Communication  : Niece Elnita Maxwell [phone] by phone today  Code Status : Full code  Consults  : GI  PUD Prophylaxis : PPI   Procedures  :     EGD      Disposition Plan  :    Status is: Observation   DVT Prophylaxis  :    enoxaparin (LOVENOX) injection 40 mg Start: 04/25/23 1600 Place and maintain sequential compression device Start: 04/24/23 1006 SCDs Start: 04/23/23 1356     Lab Results  Component Value Date   PLT 421 (H) 04/26/2023    Diet :  Diet Order             DIET SOFT Room service appropriate? No; Fluid consistency: Thin  Diet effective now                    Inpatient Medications  Scheduled Meds:  enoxaparin (LOVENOX) injection  40 mg Subcutaneous Q24H   famotidine  20 mg Oral Daily   latanoprost  1 drop Right Eye QHS   metoprolol succinate  25 mg Oral Daily   [START ON 04/27/2023] pantoprazole  40 mg Intravenous Q12H   polyethylene glycol  17 g Oral BID   sodium chloride flush  3 mL Intravenous  Q12H   Continuous Infusions:   PRN Meds:.acetaminophen **OR** acetaminophen, alum & mag hydroxide-simeth, melatonin, traZODone  Antibiotics  :    Anti-infectives (From admission, onward)    None         Objective:   Vitals:   04/26/23 0100 04/26/23 0140 04/26/23 0144 04/26/23 1209  BP:  (!) 180/95    Pulse: (!) 137 (!) 134 (!) 123   Resp: 19  19   Temp:      TempSrc:    Oral  SpO2: 100%  100%   Weight:      Height:        Wt Readings from Last 3 Encounters:  04/23/23 52.6 kg  04/24/22 52.6 kg  11/07/21 53.1 kg     Intake/Output Summary (Last 24 hours) at 04/26/2023 1339 Last data filed at 04/25/2023 2215 Gross per 24 hour  Intake 3 ml  Output --  Net 3 ml     Physical Exam  Awake Alert, Oriented X 3, No new F.N deficits, Normal affect Symmetrical Chest wall movement, Good air movement bilaterally, CTAB RRR,No Gallops,Rubs or new Murmurs, No Parasternal Heave +ve B.Sounds, Abd Soft, No tenderness, No rebound - guarding or rigidity. No Cyanosis, Clubbing or edema, No new Rash or bruise        Data Review:    Recent Labs  Lab 04/23/23 1104 04/23/23 1150 04/24/23 0556 04/24/23 1550 04/25/23 0608 04/26/23 0556  WBC 5.0  --  4.6 4.3 5.1 5.1  HGB 5.5* 6.1* 9.5* 10.0* 9.1* 9.0*  HCT 18.8* 18.0* 28.4* 30.8* 27.7* 28.9*  PLT 456*  --  409* 424* 388 421*  MCV 92.6  --  83.0 84.2 84.2 86.3  MCH  27.1  --  27.8 27.3 27.7 26.9  MCHC 29.3*  --  33.5 32.5 32.9 31.1  RDW 15.9*  --  17.1* 16.9* 16.2* 15.9*  LYMPHSABS 0.4*  --   --   --  0.5* 0.6*  MONOABS 0.5  --   --   --  0.7 0.7  EOSABS 0.2  --   --   --  0.2 0.2  BASOSABS 0.0  --   --   --  0.0 0.1    Recent Labs  Lab 04/23/23 1104 04/23/23 1150 04/23/23 1225 04/24/23 0556 04/25/23 0608 04/26/23 0556  NA 143 142  --  141 142 142  K 3.6 3.6  --  3.5 4.0 3.8  CL 106  --   --  107 108 109  CO2 28  --   --  26 26 28   ANIONGAP 9  --   --  8 8 5   GLUCOSE 107*  --   --  104* 121* 98  BUN 14  --    --  8 15 15   CREATININE 0.88  --   --  0.79 0.95 0.88  AST 15  --   --   --   --   --   ALT 13  --   --   --   --   --   ALKPHOS 44  --   --   --   --   --   BILITOT 0.4  --   --   --   --   --   ALBUMIN 2.8*  --   --   --   --   --   INR  --   --  1.3*  --   --   --   MG  --   --   --  1.9 2.1 2.4  CALCIUM 8.5*  --   --  8.8* 8.7* 8.6*     Radiology Reports VAS [place] LOWER EXTREMITY VENOUS (DVT)  Result Date: 04/25/2023  Lower Venous DVT Study Patient Name:  SAYDE LISH  Date of Exam:   04/25/2023 Medical Rec #: [id number]    Accession #:    [id number] Date of Birth: 1933-10-24    Patient Gender: F Patient Age:   29 years Exam Location:  [facility] Procedure:      VAS [place] LOWER EXTREMITY VENOUS (DVT) Referring Phys: Huey Bienenstock --------------------------------------------------------------------------------  Indications: Follow up right lower extremity DVT diagnosed in September 2023. Other Indications: "DVT in pregnancy". Comparison Study: 09-20-2022 Prior right lower extremity venous study Performing Technologist: Jean Rosenthal RDMS, RVT  Examination Guidelines: A complete evaluation includes B-mode imaging, spectral Doppler, color Doppler, and power Doppler as needed of all accessible portions of each vessel. Bilateral testing is considered an integral part of a complete examination. Limited examinations for reoccurring indications may be performed as noted. The reflux portion of the exam is performed with the patient in reverse Trendelenburg.  +---------+---------------+---------+-----------+----------+--------------+ RIGHT    CompressibilityPhasicitySpontaneityPropertiesThrombus Aging +---------+---------------+---------+-----------+----------+--------------+ CFV      Full           Yes      Yes                                 +---------+---------------+---------+-----------+----------+--------------+ SFJ      Full                                                         +---------+---------------+---------+-----------+----------+--------------+  FV Prox  Full                                                        +---------+---------------+---------+-----------+----------+--------------+ FV Mid   Full                                                        +---------+---------------+---------+-----------+----------+--------------+ FV DistalFull                                                        +---------+---------------+---------+-----------+----------+--------------+ PFV      Full                                                        +---------+---------------+---------+-----------+----------+--------------+ POP      Full           Yes      Yes                                 +---------+---------------+---------+-----------+----------+--------------+ PTV      Full                                                        +---------+---------------+---------+-----------+----------+--------------+ PERO     Full                                                        +---------+---------------+---------+-----------+----------+--------------+   +---------+---------------+---------+-----------+----------+--------------+ LEFT     CompressibilityPhasicitySpontaneityPropertiesThrombus Aging +---------+---------------+---------+-----------+----------+--------------+ CFV      Full           Yes      Yes                                 +---------+---------------+---------+-----------+----------+--------------+ SFJ      Full                                                        +---------+---------------+---------+-----------+----------+--------------+ FV Prox  Full                                                        +---------+---------------+---------+-----------+----------+--------------+  FV Mid   Full                                                         +---------+---------------+---------+-----------+----------+--------------+ FV DistalFull                                                        +---------+---------------+---------+-----------+----------+--------------+ PFV      Full                                                        +---------+---------------+---------+-----------+----------+--------------+ POP      Full           Yes      Yes                                 +---------+---------------+---------+-----------+----------+--------------+ PTV      Full                                                        +---------+---------------+---------+-----------+----------+--------------+ PERO     Full                                                        +---------+---------------+---------+-----------+----------+--------------+     Summary: RIGHT: - There is no evidence of deep vein thrombosis in the lower extremity.  - No cystic structure found in the popliteal fossa.  LEFT: - There is no evidence of deep vein thrombosis in the lower extremity.  - No cystic structure found in the popliteal fossa.  *See table(s) above for measurements and observations. Electronically signed by Coral Else MD on 04/25/2023 at 10:13:43 PM.    Final    CT CHEST W CONTRAST  Result Date: 04/25/2023 CLINICAL DATA:  Hemoptysis. History of SMV thrombus and mediastinal mass. EXAM: CT CHEST WITH CONTRAST TECHNIQUE: Multidetector CT imaging of the chest was performed during intravenous contrast administration. RADIATION DOSE REDUCTION: This exam was performed according to the departmental dose-optimization program which includes automated exposure control, adjustment of the mA and/or kV according to patient size and/or use of iterative reconstruction technique. CONTRAST:  50mL OMNIPAQUE IOHEXOL 350 MG/ML SOLN COMPARISON:  X-ray 04/23/2023 FINDINGS: Cardiovascular: Heart is nonenlarged. Coronary artery calcifications are seen. Small pericardial  effusion. The thoracic aorta has a normal course and caliber with scattered calcified atherosclerotic plaque. Mediastinum/Nodes: There is a very large hiatal hernia. This a complex hernia with a paraesophageal component. The proximal stomach is in the mediastinum. The body extends into the abdomen in than the distal stomach extends back up into the mediastinum before passing back down  at the pylorus. The esophagus is dilated, fluid-filled up to the level of the thoracic inlet. There is some wall thickening of the pylorus. Please correlate with symptoms. The herniated stomach is distended with fluid and debris. Please correlate with gastric emptying. No specific abnormal lymph node enlargement identified in the axillary region, hilum or mediastinum. Heterogeneous thyroid. Lungs/Pleura: Extensive breathing motion throughout the examination. There is some basilar dependent atelectasis. Few areas of bronchiectasis along the lung bases. Otherwise no consolidation, pneumothorax or effusion. Upper Abdomen: Adrenal glands are preserved in the visualized abdomen. Central hepatic benign-appearing cyst. Gallbladder is nondilated. Diffuse colonic stool with diverticula. Large proximal duodenal diverticulum. Musculoskeletal: Moderate levoconvex curvature of the thoracolumbar spine. Advanced multilevel degenerative changes. Advanced degenerative changes of the shoulders. IMPRESSION: Very large mixed type paraesophageal and hiatal hernia. There is wall thickening as well of the the pylorus. There is significant fluid and debris in the stomach. Please correlate for gastric emptying. In addition there is a dilated esophagus with luminal fluid and debris extending up to the thoracic inlet. Aortic Atherosclerosis (ICD10-I70.0). Electronically Signed   By: Karen Kays M.D.   On: 04/25/2023 10:10   CT Head Wo Contrast  Result Date: 04/23/2023 CLINICAL DATA:  Altered mental status EXAM: CT HEAD WITHOUT CONTRAST TECHNIQUE: Contiguous  axial images were obtained from the base of the skull through the vertex without intravenous contrast. RADIATION DOSE REDUCTION: This exam was performed according to the departmental dose-optimization program which includes automated exposure control, adjustment of the mA and/or kV according to patient size and/or use of iterative reconstruction technique. COMPARISON:  CT head 06/15/10 FINDINGS: Brain: No evidence of acute infarction, hemorrhage, hydrocephalus, extra-axial collection or mass lesion/mass effect. Sequela of mild chronic microvascular ischemic change. Vascular: No hyperdense vessel or unexpected calcification. Skull: Normal. Negative for fracture or focal lesion. Sinuses/Orbits: No middle ear or mastoid effusion. Paranasal sinuses are clear. Bilateral lens replacement. Orbits are otherwise unremarkable. Other: None. IMPRESSION: No acute intracranial abnormality. Electronically Signed   By: Lorenza Cambridge M.D.   On: 04/23/2023 12:24   DG Chest Portable 1 View  Result Date: 04/23/2023 CLINICAL DATA:  Dizziness.  Infection EXAM: PORTABLE CHEST 1 VIEW COMPARISON:  X-ray 08/07/2022 FINDINGS: Film is rotated to the right. Tortuous and ectatic aorta. Normal cardiopericardial silhouette. Presumed hiatal hernia. No consolidation, pneumothorax or effusion. No edema. Overlapping cardiac leads. Degenerative changes seen of the shoulders. IMPRESSION: Rotated radiograph.  Hiatal hernia. No consolidation or pneumothorax. Electronically Signed   By: Karen Kays M.D.   On: 04/23/2023 11:30      Signature  -   Huey Bienenstock M.D on 04/26/2023 at 1:39 PM   -  To page go to [email or web address]

## 2023-04-26 NOTE — Progress Notes (Signed)
Occupational Therapy Treatment Patient Details Name: [name] MRN: [id number] DOB: [birth year] Today's Date: 04/26/2023   History of present illness Pt is an 89 y/o female admitted 4/29 for symptomatic anemia in setting of likely GI bleed. Pending EGD 4/30. PMH: anemia, dementia, CAD, GERD, CKD, DVT, Barrett's esophagus, HSV, HTN, migraine, osteoporosis, diverticulosis.   OT comments  Pt making steady progress towards OT goals, able to mobilize around room w/ min guard without LOB. Pt able to manage LB ADLs with Min A primarily due to difficulty with line awareness. Continue to rec therapy follow up in home setting at DC.    Recommendations for follow up therapy are one component of a multi-disciplinary discharge planning process, led by the attending physician.  Recommendations may be updated based on patient status, additional functional criteria and insurance authorization.    Assistance Recommended at Discharge Intermittent Supervision/Assistance  Patient can return home with the following  A little help with bathing/dressing/bathroom;Assistance with cooking/housework;Assist for transportation   Equipment Recommendations  BSC/3in1    Recommendations for Other Services      Precautions / Restrictions Precautions Precautions: Fall Restrictions Weight Bearing Restrictions: No       Mobility Bed Mobility Overal bed mobility: Needs Assistance Bed Mobility: Supine to Sit     Supine to sit: Supervision, HOB elevated          Transfers Overall transfer level: Needs assistance Equipment used: None Transfers: Bed to chair/wheelchair/BSC, Sit to/from Stand Sit to Stand: Supervision Stand pivot transfers: Min guard         General transfer comment: to [facility] from bed     Balance Overall balance assessment: Needs assistance Sitting-balance support: No upper extremity supported, Feet supported Sitting balance-Leahy Scale: Good     Standing balance support: During  functional activity, Single extremity supported Standing balance-Leahy Scale: Fair                             ADL either performed or assessed with clinical judgement   ADL Overall ADL's : Needs assistance/impaired Eating/Feeding: Independent   Grooming: Set up;Sitting;Wash/dry hands                   Toilet Transfer: Min Acupuncturist Details (indicate cue type and reason): no AD and no LOB Toileting- Clothing Manipulation and Hygiene: Minimal assistance;Sitting/lateral lean;Sit to/from stand Toileting - Clothing Manipulation Details (indicate cue type and reason): able to perform hygiene w/ assist for multiple clothing mgmt/items (due to lines and pt coldness - requested cardigan and gown around back)     Functional mobility during ADLs: Min guard General ADL Comments: Able to mobilize in room without AD though furniture walking as pt reports doing this at home as well    Extremity/Trunk Assessment Upper Extremity Assessment Upper Extremity Assessment: Generalized weakness   Lower Extremity Assessment Lower Extremity Assessment: Defer to PT evaluation        Vision   Vision Assessment?: No apparent visual deficits   Perception     Praxis      Cognition Arousal/Alertness: Awake/alert Behavior During Therapy: WFL for tasks assessed/performed Overall Cognitive Status: Within Functional Limits for tasks assessed                                 General Comments: alert and oriented, appropriate in responses and able to recall situation/therapist's name at end of session.  able to express needs well        Exercises      Shoulder Instructions       General Comments      Pertinent Vitals/ Pain       Pain Assessment Pain Assessment: No/denies pain  Home Living                                          Prior Functioning/Environment              Frequency  Min 2X/week         Progress Toward Goals  OT Goals(current goals can now be found in the care plan section)  Progress towards OT goals: Progressing toward goals  Acute Rehab OT Goals Patient Stated Goal: home soon OT Goal Formulation: With patient Time For Goal Achievement: 05/08/23 Potential to Achieve Goals: Good ADL Goals Pt Will Perform Lower Body Dressing: with modified independence;sit to/from stand Pt Will Transfer to Toilet: with modified independence;ambulating Additional ADL Goal #1: Pt to verbalize at least 3 fall prevention strategies to implement in home environment  Plan Discharge plan remains appropriate    Co-evaluation                 AM-PAC OT "6 Clicks" Daily Activity     Outcome Measure   Help from another person eating meals?: None Help from another person taking care of personal grooming?: A Little Help from another person toileting, which includes using toliet, bedpan, or urinal?: A Little Help from another person bathing (including washing, rinsing, drying)?: A Little Help from another person to put on and taking off regular upper body clothing?: A Little Help from another person to put on and taking off regular lower body clothing?: A Lot 6 Click Score: 18    End of Session    OT Visit Diagnosis: Unsteadiness on feet (R26.81);Other abnormalities of gait and mobility (R26.89);Muscle weakness (generalized) (M62.81)   Activity Tolerance Patient tolerated treatment well   Patient Left in chair;with call bell/phone within reach;with chair alarm set   Nurse Communication Mobility status        Time: 1610-9604 OT Time Calculation (min): 30 min  Charges: OT General Charges $OT Visit: 1 Visit OT Treatments $Self Care/Home Management : 23-37 mins  Bradd Canary, OTR/L Acute Rehab Services Office: [phone]   Lorre Munroe 04/26/2023, 8:15 AM

## 2023-04-26 NOTE — Progress Notes (Signed)
The biopsies taken during the upper endoscopy demonstrate chronic gastritis, but there was no evidence of Helicobacter pylori infection.    GI service will remain available as needed.

## 2023-04-26 NOTE — Progress Notes (Signed)
Physical Therapy Treatment Patient Details Name: [name] MRN: [id number] DOB: 11/20/1933 Today's Date: 04/26/2023   History of Present Illness Pt is an 89 y/o female admitted 4/29 for symptomatic anemia in setting of likely GI bleed. Pending EGD 4/30. PMH: anemia, dementia, CAD, GERD, CKD, DVT, Barrett's esophagus, HSV, HTN, migraine, osteoporosis, diverticulosis.    PT Comments    Pt tolerated today's session well, ambulating with SPC and minG for safety. Pt reports ambulating by furniture surfing at home as she has a small home, although niece was present during last session and reports use of SPC as well. Pt has increased trunk flexion with kyphotic posture and downward gaze, able to intermittently correct but remains very flexed throughout ambulation, close guard for safety but pt tolerating without LOB, likely close to her baseline gait pattern. Acute PT will continue to follow up with pt, discharge recommendations remain appropriate.     Recommendations for follow up therapy are one component of a multi-disciplinary discharge planning process, led by the attending physician.  Recommendations may be updated based on patient status, additional functional criteria and insurance authorization.  Follow Up Recommendations       Assistance Recommended at Discharge Intermittent Supervision/Assistance  Patient can return home with the following A little help with walking and/or transfers;Help with stairs or ramp for entrance;Assistance with cooking/housework   Equipment Recommendations  None recommended by PT    Recommendations for Other Services       Precautions / Restrictions Precautions Precautions: Fall Restrictions Weight Bearing Restrictions: No     Mobility  Bed Mobility Overal bed mobility: Needs Assistance Bed Mobility: Supine to Sit, Sit to Supine     Supine to sit: Supervision, HOB elevated Sit to supine: Supervision, HOB elevated   General bed mobility  comments: increased time but completing without physical assist and use of bed rail    Transfers Overall transfer level: Needs assistance Equipment used: Straight cane Transfers: Sit to/from Stand Sit to Stand: Min guard           General transfer comment: minG for safety with use of SPC, cueing pt for proper grip of SPC    Ambulation/Gait Ambulation/Gait assistance: Min guard Gait Distance (Feet): 25 Feet Assistive device: Straight cane Gait Pattern/deviations: Step-through pattern, Trunk flexed, Decreased stride length, Decreased dorsiflexion - right, Decreased dorsiflexion - left Gait velocity: decreased     General Gait Details: very increased trunk flexion with kyphotic posture and downward gaze. Pt initially reaching out for support with LUE but ambulating with SPC and close guard for safety   Stairs             Wheelchair Mobility    Modified Rankin (Stroke Patients Only)       Balance Overall balance assessment: Needs assistance Sitting-balance support: No upper extremity supported, Feet supported Sitting balance-Leahy Scale: Good     Standing balance support: Single extremity supported, During functional activity, Reliant on assistive device for balance Standing balance-Leahy Scale: Fair Standing balance comment: reliant on SPC during ambulation                            Cognition Arousal/Alertness: Awake/alert Behavior During Therapy: WFL for tasks assessed/performed Overall Cognitive Status: No family/caregiver present to determine baseline cognitive functioning                                 General  Comments: pt pleasant throughout, short term memory seems impaired but pt with history of dementia        Exercises      General Comments General comments (skin integrity, edema, etc.): VSS on room air      Pertinent Vitals/Pain Pain Assessment Pain Assessment: No/denies pain    Home Living                           Prior Function            PT Goals (current goals can now be found in the care plan section) Acute Rehab PT Goals Patient Stated Goal: get better and go home PT Goal Formulation: With patient Time For Goal Achievement: 05/08/23 Potential to Achieve Goals: Good Progress towards PT goals: Progressing toward goals    Frequency    Min 3X/week      PT Plan Current plan remains appropriate    Co-evaluation              AM-PAC PT "6 Clicks" Mobility   Outcome Measure  Help needed turning from your back to your side while in a flat bed without using bedrails?: A Little Help needed moving from lying on your back to sitting on the side of a flat bed without using bedrails?: A Little Help needed moving to and from a bed to a chair (including a wheelchair)?: A Little Help needed standing up from a chair using your arms (e.g., wheelchair or bedside chair)?: A Little Help needed to walk in hospital room?: A Little Help needed climbing 3-5 steps with a railing? : A Lot 6 Click Score: 17    End of Session Equipment Utilized During Treatment: Gait belt Activity Tolerance: Patient tolerated treatment well Patient left: in bed;with call bell/phone within reach Nurse Communication: Mobility status PT Visit Diagnosis: Muscle weakness (generalized) (M62.81);Difficulty in walking, not elsewhere classified (R26.2)     Time: 1610-9604 PT Time Calculation (min) (ACUTE ONLY): 14 min  Charges:  $Gait Training: 8-22 mins                     Lindalou Hose, PT DPT Acute Rehabilitation Services Office [phone]    Leonie Man 04/26/2023, 4:22 PM

## 2023-04-26 NOTE — Progress Notes (Signed)
Mobility Specialist - Progress Note   04/26/23 1154  Mobility  Activity Moved into chair position in bed  Activity Response Tolerated well  Mobility Referral Yes  $Mobility charge 1 Mobility   Pt was received in bed and just received lunch try. Pt refused to get up to chair for lunch but was agreeable to being moved into chair position in bed. Pt was left in bed with all needs met and eating lunch.   Alda Lea  Mobility Specialist Please contact via Special educational needs teacher or Rehab office at [phone]

## 2023-04-26 NOTE — TOC Initial Note (Signed)
Transition of Care [facility]) - Initial/Assessment Note    Patient Details  Name: [name] MRN: [id number] Date of Birth: [birth year]  [facility]) CM/SW Contact:    Gordy Clement, RN Phone Number: 04/26/2023, 4:06 PM  Clinical Narrative:     CM met with Patient bedside to discuss dc plan. Patient will return home. She lives alone but does have a daily caregiver a couple of hours each day. Home health has been recommended and Patient asked CM to call Niece to discuss. [facility] ( [facility]) will provide physical and occupational therapy at home. A bedside commode has been delivered to room by [employer].  Niece will transport patient when it is time for DC.   TOC will continue to follow patient for any additional discharge needs       Expected Discharge Plan: Home w Home Health Services Barriers to Discharge: Continued Medical Work up   Patient Goals and CMS Choice Patient states their goals for this hospitalization and ongoing recovery are:: Go home CMS Medicare.gov Compare Post Acute Care list provided to:: Other (Comment Required) (Spoke with Niece over phone per Patient request) Choice offered to / list presented to :  (Niece)      Expected Discharge Plan and Services   Discharge Planning Services: CM Consult Post Acute Care Choice: Home Health, Durable Medical Equipment Living arrangements for the past 2 months: Single Family Home                 DME Arranged: Bedside commode DME Agency: [facility] Date DME Agency Contacted: 04/26/23 Time DME Agency Contacted: 1300 Representative spoke with at DME Agency: Zollie Beckers HH Arranged: PT, OT HH Agency: [facility] Date [facility] Agency Contacted: 04/26/23 Time HH Agency Contacted: 1605 Representative spoke with at [facility] Agency: Tresa Endo  Prior Living Arrangements/Services Living arrangements for the past 2 months: Single Family Home Lives with:: Self (Has a daily caregiver) Patient language and need for  interpreter reviewed:: Yes Do you feel safe going back to the place where you live?: Yes      Need for Family Participation in Patient Care: Yes (Comment) Care giver support system in place?: Yes (comment) Current home services: Other (comment) (Caregiver daily for 2 hours) Criminal Activity/Legal Involvement Pertinent to Current Situation/Hospitalization: No - Comment as needed  Activities of Daily Living      Permission Sought/Granted Permission sought to share information with : Facility Medical sales representative, Case Estate manager/land agent granted to share information with : Yes, Verbal Permission Granted  Share Information with NAME: Niece  Permission granted to share info w AGENCY: HH/DME companies        Emotional Assessment Appearance:: Appears stated age Attitude/Demeanor/Rapport: Gracious Affect (typically observed): Appropriate Orientation: : Oriented to Self, Oriented to Place, Oriented to  Time, Oriented to Situation Alcohol / Substance Use: Not Applicable Psych Involvement: No (comment)  Admission diagnosis:  Weakness [R53.1] Symptomatic anemia [D64.9] Anemia, unspecified type [D64.9] Patient Active Problem List   Diagnosis Date Noted   Gastritis and gastroduodenitis 04/24/2023   Symptomatic anemia 04/23/2023   Gastroesophageal reflux disease with esophagitis without hemorrhage 04/23/2023   Barrett's esophagus without dysplasia 04/23/2023   Acquired thrombophilia (HCC) 03/05/2023   Chronic deep vein thrombosis (DVT) of right femoral vein (HCC) 03/05/2023   Hearing loss of both ears due to cerumen impaction 09/26/2022   Closed wedge compression fracture of thoracic vertebra (HCC) 08/10/2022   Myofascial pain syndrome 07/07/2021   Stage 3b chronic kidney disease (HCC) 05/05/2021  Degenerative disc disease, lumbar 01/01/2018   Hiatal hernia 11/04/2016   Gastroesophageal reflux disease without esophagitis 07/31/2014   Dementia (HCC)    Spondylosis    Migraine     CAD (coronary artery disease)    HSV-2 (herpes simplex virus 2) infection    Osteoporosis    CONSTIPATION 12/12/2010   ANEMIA, IRON DEFICIENCY 06/17/2008   GLAUCOMA 06/16/2008   Essential hypertension 06/16/2008   PCP:  Etta Grandchild, MD Pharmacy:   [facility] [phone] - [place], [place] - [address] AT [facility] [address]  [place] [address] Phone: [phone] Fax: [phone]     Social Determinants of Health (SDOH) Social History: SDOH Screenings   Food Insecurity: No Food Insecurity (05/02/2022)  Housing: Low Risk  (05/02/2022)  Transportation Needs: No Transportation Needs (05/02/2022)  Alcohol Screen: Low Risk  (05/02/2022)  Depression (PHQ2-9): Low Risk  (09/20/2022)  Financial Resource Strain: Low Risk  (05/02/2022)  Physical Activity: Inactive (05/02/2022)  Social Connections: Patient Declined (05/02/2022)  Stress: No Stress Concern Present (05/02/2022)  Tobacco Use: Low Risk  (04/26/2023)   SDOH Interventions:     Readmission Risk Interventions     No data to display

## 2023-04-27 ENCOUNTER — Telehealth: Payer: Self-pay | Admitting: Internal Medicine

## 2023-04-27 ENCOUNTER — Other Ambulatory Visit (HOSPITAL_COMMUNITY): Payer: Self-pay

## 2023-04-27 DIAGNOSIS — K2270 Barrett's esophagus without dysplasia: Secondary | ICD-10-CM | POA: Diagnosis not present

## 2023-04-27 DIAGNOSIS — D649 Anemia, unspecified: Secondary | ICD-10-CM | POA: Diagnosis not present

## 2023-04-27 DIAGNOSIS — I82511 Chronic embolism and thrombosis of right femoral vein: Secondary | ICD-10-CM | POA: Diagnosis not present

## 2023-04-27 LAB — CBC WITH DIFFERENTIAL/PLATELET
Abs Immature Granulocytes: 0.02 10*3/uL (ref 0.00–0.07)
Basophils Absolute: 0 10*3/uL (ref 0.0–0.1)
Basophils Relative: 1 %
Eosinophils Absolute: 0.2 10*3/uL (ref 0.0–0.5)
Eosinophils Relative: 4 %
HCT: 27.5 % — ABNORMAL LOW (ref 36.0–46.0)
Hemoglobin: 8.4 g/dL — ABNORMAL LOW (ref 12.0–15.0)
Immature Granulocytes: 0 %
Lymphocytes Relative: 15 %
Lymphs Abs: 0.8 10*3/uL (ref 0.7–4.0)
MCH: 26.6 pg (ref 26.0–34.0)
MCHC: 30.5 g/dL (ref 30.0–36.0)
MCV: 87 fL (ref 80.0–100.0)
Monocytes Absolute: 0.6 10*3/uL (ref 0.1–1.0)
Monocytes Relative: 12 %
Neutro Abs: 3.6 10*3/uL (ref 1.7–7.7)
Neutrophils Relative %: 68 %
Platelets: 394 10*3/uL (ref 150–400)
RBC: 3.16 MIL/uL — ABNORMAL LOW (ref 3.87–5.11)
RDW: 15.6 % — ABNORMAL HIGH (ref 11.5–15.5)
WBC: 5.3 10*3/uL (ref 4.0–10.5)
nRBC: 0 % (ref 0.0–0.2)

## 2023-04-27 LAB — BASIC METABOLIC PANEL
Anion gap: 8 (ref 5–15)
BUN: 19 mg/dL (ref 8–23)
CO2: 25 mmol/L (ref 22–32)
Calcium: 8.4 mg/dL — ABNORMAL LOW (ref 8.9–10.3)
Chloride: 110 mmol/L (ref 98–111)
Creatinine, Ser: 0.84 mg/dL (ref 0.44–1.00)
GFR, Estimated: >60 mL/min (ref >60)
Glucose, Bld: 118 mg/dL — ABNORMAL HIGH (ref 70–99)
Potassium: 4 mmol/L (ref 3.5–5.1)
Sodium: 143 mmol/L (ref 135–145)

## 2023-04-27 LAB — MAGNESIUM: Magnesium: 2.4 mg/dL (ref 1.7–2.4)

## 2023-04-27 MED ORDER — POLYETHYLENE GLYCOL 3350 17 G PO PACK: 17.0000 g | PACK | Freq: Every day | ORAL | 0 refills | Status: DC

## 2023-04-27 MED ORDER — POLYETHYLENE GLYCOL 3350 17 GM/SCOOP PO POWD
17.0000 g | Freq: Every day | ORAL | 0 refills | Status: DC
  Filled 2023-04-27: qty 238, 14d supply, fill #0

## 2023-04-27 MED ORDER — FAMOTIDINE 20 MG PO TABS: 20.0000 mg | ORAL_TABLET | Freq: Two times a day (BID) | ORAL | Status: DC | PRN

## 2023-04-27 MED ORDER — ASPIRIN 81 MG PO TBEC: 81.0000 mg | DELAYED_RELEASE_TABLET | Freq: Every day | ORAL | 12 refills | Status: DC

## 2023-04-27 MED ORDER — PANTOPRAZOLE SODIUM 40 MG PO TBEC
40.0000 mg | DELAYED_RELEASE_TABLET | Freq: Two times a day (BID) | ORAL | 0 refills | Status: DC
Start: 2023-04-27 — End: 2023-05-18
  Filled 2023-04-27: qty 180, 90d supply, fill #0

## 2023-04-27 MED ORDER — DOCUSATE SODIUM 100 MG PO CAPS
100.0000 mg | ORAL_CAPSULE | Freq: Every day | ORAL | 0 refills | Status: AC
  Filled 2023-04-27: qty 90, 90d supply, fill #0

## 2023-04-27 NOTE — Care Management Important Message (Signed)
Important Message  Patient Details  Name: [name] MRN: [id number] Date of Birth: [birth year]   Medicare Important Message Given:  Yes  Patient left prior to IM delivery will mail copy to the patients home address.    Uzma Hellmer 04/27/2023, 2:37 PM

## 2023-04-27 NOTE — Progress Notes (Signed)
Discharge paperwork reviewed with pt. Pt verbalized understanding. Pt has taken all belongings. Pt's niece will transport pt home. Pt has taken 3 in 1 and meds from [facility] with her.

## 2023-04-27 NOTE — TOC Transition Note (Signed)
Transition of Care [facility]) - CM/SW Discharge Note   Patient Details  Name: [name] MRN: [id number] Date of Birth: 03/27/1933  Transition of Care [facility]) CM/SW Contact:  Gordy Clement, RN Phone Number: 04/27/2023, 9:58 AM   Clinical Narrative:     Patient to DC to home today with Niece. [facility] PT and OT have been arranged  [facility] commode. No additional TOC needs . AVS has been updated       Barriers to Discharge: Continued Medical Work up   Patient Goals and CMS Choice CMS Medicare.gov Compare Post Acute Care list provided to:: Other (Comment Required) (Spoke with Niece over phone per Patient request) Choice offered to / list presented to :  (Niece)  Discharge Placement                         Discharge Plan and Services Additional resources added to the After Visit Summary for     Discharge Planning Services: CM Consult Post Acute Care Choice: Home Health, Durable Medical Equipment          DME Arranged: Bedside commode DME Agency: [facility] Date DME Agency Contacted: 04/26/23 Time DME Agency Contacted: 1300 Representative spoke with at DME Agency: Zollie Beckers HH Arranged: PT, OT [facility] Agency: [facility] Date [facility] Agency Contacted: 04/26/23 Time HH Agency Contacted: 1605 Representative spoke with at [facility] Agency: Tresa Endo  Social Determinants of Health (SDOH) Interventions SDOH Screenings   Food Insecurity: No Food Insecurity (05/02/2022)  Housing: Low Risk  (05/02/2022)  Transportation Needs: No Transportation Needs (05/02/2022)  Alcohol Screen: Low Risk  (05/02/2022)  Depression (PHQ2-9): Low Risk  (09/20/2022)  Financial Resource Strain: Low Risk  (05/02/2022)  Physical Activity: Inactive (05/02/2022)  Social Connections: Patient Declined (05/02/2022)  Stress: No Stress Concern Present (05/02/2022)  Tobacco Use: Low Risk  (04/26/2023)     Readmission Risk Interventions     No data to display

## 2023-04-27 NOTE — Discharge Instructions (Signed)
Follow with Primary MD Jones, Thomas L, MD in 7 days   Get CBC, CMP, checked  by Primary MD next visit.    Activity: As tolerated with Full fall precautions use walker/cane & assistance as needed   Disposition Home    Diet: Heart Healthy   On your next visit with your primary care physician please Get Medicines reviewed and adjusted.   Please request your Prim.MD to go over all Hospital Tests and Procedure/Radiological results at the follow up, please get all Hospital records sent to your Prim MD by signing hospital release before you go home.   If you experience worsening of your admission symptoms, develop shortness of breath, life threatening emergency, suicidal or homicidal thoughts you must seek medical attention immediately by calling 911 or calling your MD immediately  if symptoms less severe.  You Must read complete instructions/literature along with all the possible adverse reactions/side effects for all the Medicines you take and that have been prescribed to you. Take any new Medicines after you have completely understood and accpet all the possible adverse reactions/side effects.   Do not drive, operating heavy machinery, perform activities at heights, swimming or participation in water activities or provide baby sitting services if your were admitted for syncope or siezures until you have seen by Primary MD or a Neurologist and advised to do so again.  Do not drive when taking Pain medications.    Do not take more than prescribed Pain, Sleep and Anxiety Medications  Special Instructions: If you have smoked or chewed Tobacco  in the last 2 yrs please stop smoking, stop any regular Alcohol  and or any Recreational drug use.  Wear Seat belts while driving.   Please note  You were cared for by a hospitalist during your hospital stay. If you have any questions about your discharge medications or the care you received while you were in the hospital after you are discharged,  you can call the unit and asked to speak with the hospitalist on call if the hospitalist that took care of you is not available. Once you are discharged, your primary care physician will handle any further medical issues. Please note that NO REFILLS for any discharge medications will be authorized once you are discharged, as it is imperative that you return to your primary care physician (or establish a relationship with a primary care physician if you do not have one) for your aftercare needs so that they can reassess your need for medications and monitor your lab values.  

## 2023-04-27 NOTE — Discharge Summary (Signed)
Physician Discharge Summary  [name] ZOX:[id number] DOB: [birth year] DOA: 04/23/2023  PCP: Etta Grandchild, MD  Admit date: 04/23/2023 Discharge date: 04/27/2023  Admitted From: (Home) Disposition:  (Home)  Recommendations for Outpatient Follow-up:  Follow up with PCP in 1-2 weeks Please obtain BMP/CBC in one week, can resume aspirin in 2 weeks if hemoglobin remained stable. Also has been discontinued as she is being treated for total of 7 months for acute DVT, and feet lower extremity venous Doppler with no further evidence of any DVT.   Home Health: (YES)  Discharge Condition: (Stable) CODE STATUS: (FULL) Diet recommendation: Heart Healthy  Brief/Interim Summary:  89 y.o. female with medical history significant of dementia, CKD 3B, GERD, Barrett's esophagitis, GI bleed, hypertension, CAD, anemia, DVT, migraines, glaucoma presenting with dizziness and confusion.  Patient has known history of upper GI bleed workup in the ER consistent with severe anemia and she was admitted for further workup.  Her EGD significant for erosive esophagitis, so CT chest was obtained, significant for very large hiatal hernia, likely contributing to her dyspepsia and erosive esophagitis.   Symptomatic anemia in setting of upper GI bleed, from erosive esophagitis, gastritis, in the setting of large hiatal hernia .  With the patient on Xarelto. -patient with history of Barrett's esophagus and previous UGI bleed, also on Xarelto. -Hemoglobin low at 5.  5 on admission, required 2 units PRBC transfusion, hemoglobin remained stable, monitor and transfuse as needed. -GI input greatly appreciated, endoscopy significant for gastritis, esophagitis. -CT chest with IV contrast significant for very large mixed type paraesophageal and hiatal hernia laded esophagus with luminal fluid and debris extending up to the thoracic inlet, so her hernia contributing to some of her upper GI bleed, including severe dyspepsia with  uncontrolled reflux, regurg, causing her positive esophagitis, at her age and frailty she will not be a surgical candidate, will continue with conservative measures including high-dose PPI, and Pepcid as needed.   dementia  - Noted,  Delirium precautions, mentation at baseline.   CKD 3B > Stable     Hypertension  -Initially holding meds in the setting of dizziness, lightheadedness, but now blood pressure much improved and elevated, as well she is tachycardic, so she has been resumed on her Cardizem and metoprolol at time of discharge, hydrochlorothiazide has been held.  Sinus tachycardia -To have sinus tachycardia in the 140s , likely from holding her Cardizem and metoprolol, this meds has been resumed, heart rate is controlled.   CAD -continue with metoprolol, will resume aspirin in 2 weeks   History of DVT  -Patient was diagnosed with DVT in September 2023, for which she has been on Xarelto, this is 4-month treatment, repeat vascular Doppler this admission showing no evidence of DVT, so she has been treated appropriately for her DVT, no further need for anticoagulation.  History of CAD - Continue with metoprolol of discharge, aspirin has been held due to GI bleed, but it can be resumed and 2 weeks or if hemoglobin remained stable.   Glaucoma - - Continue home eyedrops   Constipation -Started on bowel regimen.  Discharge Diagnoses:  Principal Problem:   Symptomatic anemia Active Problems:   ANEMIA, IRON DEFICIENCY   GLAUCOMA   Essential hypertension   Dementia (HCC)   CAD (coronary artery disease)   Gastroesophageal reflux disease without esophagitis   Hiatal hernia   Stage 3b chronic kidney disease (HCC)   Chronic deep vein thrombosis (DVT) of right femoral vein (HCC)   Gastroesophageal reflux  disease with esophagitis without hemorrhage   Barrett's esophagus without dysplasia   Gastritis and gastroduodenitis    Discharge Instructions  Discharge Instructions     Diet  - low sodium heart healthy   Complete by: As directed    Increase activity slowly   Complete by: As directed       Allergies as of 04/27/2023   No Known Allergies      Medication List     STOP taking these medications    hydrochlorothiazide 25 MG tablet Commonly known as: HYDRODIURIL   rivaroxaban 10 MG Tabs tablet Commonly known as: XARELTO       TAKE these medications    acetaminophen 500 MG tablet Commonly known as: TYLENOL Take 500 mg by mouth daily as needed for mild pain or moderate pain.   aspirin EC 81 MG tablet Take 1 tablet (81 mg total) by mouth daily. Swallow whole. Start taking on: May 11, 2023 What changed: These instructions start on May 11, 2023. If you are unsure what to do until then, ask your doctor or other care provider.   docusate sodium 100 MG capsule Commonly known as: Colace Take 1 capsule (100 mg total) by mouth daily.   FeroSul 325 (65 FE) MG tablet Generic drug: ferrous sulfate TAKE 1 TABLET(325 MG) BY MOUTH DAILY WITH BREAKFAST   gabapentin 400 MG capsule Commonly known as: Neurontin Take 1 capsule (400 mg total) by mouth at bedtime.   Hair Skin & Nails Advanced Tabs Take 1 mg by mouth daily.   latanoprost 0.005 % ophthalmic solution Commonly known as: XALATAN INT 1 GTT IN OD QD   metoprolol succinate 25 MG 24 hr tablet Commonly known as: TOPROL-XL Take 1 tablet (25 mg total) by mouth daily.   multivitamin tablet Take 1 tablet by mouth daily.   pantoprazole 40 MG tablet Commonly known as: PROTONIX Take 1 tablet (40 mg total) by mouth 2 (two) times daily. What changed: See the new instructions.   PATADAY OP Apply 1 drop to eye daily.   polyethylene glycol powder 17 GM/SCOOP powder Commonly known as: GLYCOLAX/MIRALAX Dissolve 17 g as directed and drink daily.   potassium chloride SA 20 MEQ tablet Commonly known as: KLOR-CON M TAKE 1 TABLET(20 MEQ) BY MOUTH DAILY   traMADol-acetaminophen 37.5-325 MG  tablet Commonly known as: ULTRACET TAKE 1 TABLET BY MOUTH EVERY 6 HOURS AS NEEDED What changed: when to take this   verapamil 240 MG CR tablet Commonly known as: CALAN-SR TAKE 1 TABLET(240 MG) BY MOUTH DAILY               Durable Medical Equipment  (From admission, onward)           Start     Ordered   04/26/23 1235  For home use only DME Bedside commode  Once       Question:  Patient needs a bedside commode to treat with the following condition  Answer:  Gastrointestinal hemorrhage   04/26/23 1234            Follow-up Information     [facility] Follow up.   Why: Christoper Allegra has provided you with the bedside commode Contact information: [address] [place] [address] [phone]         [facility] Follow up.   Specialty: Home Health Services Why: [facility] will provide Home Health PT and OT and will contact you within 48 hours of your returning home. Call the above number if  you haven't heard from them within this timeframe Contact information: [address] [place] [address] [phone]                No Known Allergies  Consultations: Gastroenterology   Procedures/Studies: VAS [place] LOWER EXTREMITY VENOUS (DVT)  Result Date: 04/25/2023  Lower Venous DVT Study Patient Name:  [name]  Date of Exam:   04/25/2023 Medical Rec #: [id number]    Accession #:    [id number] Date of Birth: 1933-12-04    Patient Gender: F Patient Age:   89 years Exam Location:  [facility] Procedure:      VAS [place] LOWER EXTREMITY VENOUS (DVT) Referring Phys: Huey Bienenstock --------------------------------------------------------------------------------  Indications: Follow up right lower extremity DVT diagnosed in September 2023. Other Indications: "DVT in pregnancy". Comparison Study: 09-20-2022 Prior right lower extremity venous study Performing Technologist: Jean Rosenthal RDMS, RVT  Examination Guidelines: A complete  evaluation includes B-mode imaging, spectral Doppler, color Doppler, and power Doppler as needed of all accessible portions of each vessel. Bilateral testing is considered an integral part of a complete examination. Limited examinations for reoccurring indications may be performed as noted. The reflux portion of the exam is performed with the patient in reverse Trendelenburg.  +---------+---------------+---------+-----------+----------+--------------+ RIGHT    CompressibilityPhasicitySpontaneityPropertiesThrombus Aging +---------+---------------+---------+-----------+----------+--------------+ CFV      Full           Yes      Yes                                 +---------+---------------+---------+-----------+----------+--------------+ SFJ      Full                                                        +---------+---------------+---------+-----------+----------+--------------+ FV Prox  Full                                                        +---------+---------------+---------+-----------+----------+--------------+ FV Mid   Full                                                        +---------+---------------+---------+-----------+----------+--------------+ FV DistalFull                                                        +---------+---------------+---------+-----------+----------+--------------+ PFV      Full                                                        +---------+---------------+---------+-----------+----------+--------------+ POP      Full  Yes      Yes                                 +---------+---------------+---------+-----------+----------+--------------+ PTV      Full                                                        +---------+---------------+---------+-----------+----------+--------------+ PERO     Full                                                         +---------+---------------+---------+-----------+----------+--------------+   +---------+---------------+---------+-----------+----------+--------------+ LEFT     CompressibilityPhasicitySpontaneityPropertiesThrombus Aging +---------+---------------+---------+-----------+----------+--------------+ CFV      Full           Yes      Yes                                 +---------+---------------+---------+-----------+----------+--------------+ SFJ      Full                                                        +---------+---------------+---------+-----------+----------+--------------+ FV Prox  Full                                                        +---------+---------------+---------+-----------+----------+--------------+ FV Mid   Full                                                        +---------+---------------+---------+-----------+----------+--------------+ FV DistalFull                                                        +---------+---------------+---------+-----------+----------+--------------+ PFV      Full                                                        +---------+---------------+---------+-----------+----------+--------------+ POP      Full           Yes      Yes                                 +---------+---------------+---------+-----------+----------+--------------+ PTV  Full                                                        +---------+---------------+---------+-----------+----------+--------------+ PERO     Full                                                        +---------+---------------+---------+-----------+----------+--------------+     Summary: RIGHT: - There is no evidence of deep vein thrombosis in the lower extremity.  - No cystic structure found in the popliteal fossa.  LEFT: - There is no evidence of deep vein thrombosis in the lower extremity.  - No cystic structure found in the popliteal fossa.   *See table(s) above for measurements and observations. Electronically signed by Coral Else MD on 04/25/2023 at 10:13:43 PM.    Final    CT CHEST W CONTRAST  Result Date: 04/25/2023 CLINICAL DATA:  Hemoptysis. History of SMV thrombus and mediastinal mass. EXAM: CT CHEST WITH CONTRAST TECHNIQUE: Multidetector CT imaging of the chest was performed during intravenous contrast administration. RADIATION DOSE REDUCTION: This exam was performed according to the departmental dose-optimization program which includes automated exposure control, adjustment of the mA and/or kV according to patient size and/or use of iterative reconstruction technique. CONTRAST:  50mL OMNIPAQUE IOHEXOL 350 MG/ML SOLN COMPARISON:  X-ray 04/23/2023 FINDINGS: Cardiovascular: Heart is nonenlarged. Coronary artery calcifications are seen. Small pericardial effusion. The thoracic aorta has a normal course and caliber with scattered calcified atherosclerotic plaque. Mediastinum/Nodes: There is a very large hiatal hernia. This a complex hernia with a paraesophageal component. The proximal stomach is in the mediastinum. The body extends into the abdomen in than the distal stomach extends back up into the mediastinum before passing back down at the pylorus. The esophagus is dilated, fluid-filled up to the level of the thoracic inlet. There is some wall thickening of the pylorus. Please correlate with symptoms. The herniated stomach is distended with fluid and debris. Please correlate with gastric emptying. No specific abnormal lymph node enlargement identified in the axillary region, hilum or mediastinum. Heterogeneous thyroid. Lungs/Pleura: Extensive breathing motion throughout the examination. There is some basilar dependent atelectasis. Few areas of bronchiectasis along the lung bases. Otherwise no consolidation, pneumothorax or effusion. Upper Abdomen: Adrenal glands are preserved in the visualized abdomen. Central hepatic benign-appearing cyst.  Gallbladder is nondilated. Diffuse colonic stool with diverticula. Large proximal duodenal diverticulum. Musculoskeletal: Moderate levoconvex curvature of the thoracolumbar spine. Advanced multilevel degenerative changes. Advanced degenerative changes of the shoulders. IMPRESSION: Very large mixed type paraesophageal and hiatal hernia. There is wall thickening as well of the the pylorus. There is significant fluid and debris in the stomach. Please correlate for gastric emptying. In addition there is a dilated esophagus with luminal fluid and debris extending up to the thoracic inlet. Aortic Atherosclerosis (ICD10-I70.0). Electronically Signed   By: Karen Kays M.D.   On: 04/25/2023 10:10   CT Head Wo Contrast  Result Date: 04/23/2023 CLINICAL DATA:  Altered mental status EXAM: CT HEAD WITHOUT CONTRAST TECHNIQUE: Contiguous axial images were obtained from the base of the skull through the vertex without intravenous contrast. RADIATION DOSE REDUCTION: This exam was performed according to  the departmental dose-optimization program which includes automated exposure control, adjustment of the mA and/or kV according to patient size and/or use of iterative reconstruction technique. COMPARISON:  CT head 06/15/10 FINDINGS: Brain: No evidence of acute infarction, hemorrhage, hydrocephalus, extra-axial collection or mass lesion/mass effect. Sequela of mild chronic microvascular ischemic change. Vascular: No hyperdense vessel or unexpected calcification. Skull: Normal. Negative for fracture or focal lesion. Sinuses/Orbits: No middle ear or mastoid effusion. Paranasal sinuses are clear. Bilateral lens replacement. Orbits are otherwise unremarkable. Other: None. IMPRESSION: No acute intracranial abnormality. Electronically Signed   By: Lorenza Cambridge M.D.   On: 04/23/2023 12:24   DG Chest Portable 1 View  Result Date: 04/23/2023 CLINICAL DATA:  Dizziness.  Infection EXAM: PORTABLE CHEST 1 VIEW COMPARISON:  X-ray 08/07/2022  FINDINGS: Film is rotated to the right. Tortuous and ectatic aorta. Normal cardiopericardial silhouette. Presumed hiatal hernia. No consolidation, pneumothorax or effusion. No edema. Overlapping cardiac leads. Degenerative changes seen of the shoulders. IMPRESSION: Rotated radiograph.  Hiatal hernia. No consolidation or pneumothorax. Electronically Signed   By: Karen Kays M.D.   On: 04/23/2023 11:30      Subjective:  No significant events overnight, she denies any complaints today, she is eager to go home. Discharge Exam: Vitals:   04/27/23 0400 04/27/23 0824  BP: (!) 111/58 (!) 147/79  Pulse: 83 (!) 103  Resp: 20   Temp: 98.2 F (36.8 C)   SpO2: 99%    Vitals:   04/26/23 2000 04/27/23 0000 04/27/23 0400 04/27/23 0824  BP: 125/69 (!) 159/69 (!) 111/58 (!) 147/79  Pulse: 96  83 (!) 103  Resp: 19 19 20    Temp: 98 F (36.7 C) 98.1 F (36.7 C) 98.2 F (36.8 C)   TempSrc: Oral Oral Oral   SpO2: 98% 93% 99%   Weight:      Height:        General: Pt is alert, awake, not in acute distress Cardiovascular: RRR, S1/S2 +, no rubs, no gallops Respiratory: CTA bilaterally, no wheezing, no rhonchi Abdominal: Soft, NT, ND, bowel sounds + Extremities: no edema, no cyanosis    The results of significant diagnostics from this hospitalization (including imaging, microbiology, ancillary and laboratory) are listed below for reference.     Microbiology: No results found for this or any previous visit (from the past 240 hour(s)).   Labs: BNP (last 3 results) No results for input(s): "BNP" in the last 8760 hours. Basic Metabolic Panel: Recent Labs  Lab 04/23/23 1104 04/23/23 1150 04/24/23 0556 04/25/23 0608 04/26/23 0556 04/27/23 0554  NA 143 142 141 142 142 143  K 3.6 3.6 3.5 4.0 3.8 4.0  CL 106  --  107 108 109 110  CO2 28  --  26 26 28 25   GLUCOSE 107*  --  104* 121* 98 118*  BUN 14  --  8 15 15 19   CREATININE 0.88  --  0.79 0.95 0.88 0.84  CALCIUM 8.5*  --  8.8* 8.7*  8.6* 8.4*  MG  --   --  1.9 2.1 2.4 2.4   Liver Function Tests: Recent Labs  Lab 04/23/23 1104  AST 15  ALT 13  ALKPHOS 44  BILITOT 0.4  PROT 5.6*  ALBUMIN 2.8*   No results for input(s): "LIPASE", "AMYLASE" in the last 168 hours. No results for input(s): "AMMONIA" in the last 168 hours. CBC: Recent Labs  Lab 04/23/23 1104 04/23/23 1150 04/24/23 0556 04/24/23 1550 04/25/23 0608 04/26/23 0556 04/27/23 0554  WBC 5.0  --  4.6 4.3 5.1 5.1 5.3  NEUTROABS 3.8  --   --   --  3.6 3.5 3.6  HGB 5.5*   < > 9.5* 10.0* 9.1* 9.0* 8.4*  HCT 18.8*   < > 28.4* 30.8* 27.7* 28.9* 27.5*  MCV 92.6  --  83.0 84.2 84.2 86.3 87.0  PLT 456*  --  409* 424* 388 421* 394   < > = values in this interval not displayed.   Cardiac Enzymes: No results for input(s): "CKTOTAL", "CKMB", "CKMBINDEX", "TROPONINI" in the last 168 hours. BNP: Invalid input(s): "POCBNP" CBG: No results for input(s): "GLUCAP" in the last 168 hours. D-Dimer No results for input(s): "DDIMER" in the last 72 hours. Hgb A1c No results for input(s): "HGBA1C" in the last 72 hours. Lipid Profile No results for input(s): "CHOL", "HDL", "LDLCALC", "TRIG", "CHOLHDL", "LDLDIRECT" in the last 72 hours. Thyroid function studies No results for input(s): "TSH", "T4TOTAL", "T3FREE", "THYROIDAB" in the last 72 hours.  Invalid input(s): "FREET3" Anemia work up No results for input(s): "VITAMINB12", "FOLATE", "FERRITIN", "TIBC", "IRON", "RETICCTPCT" in the last 72 hours. Urinalysis    Component Value Date/Time   COLORURINE YELLOW 10/21/2016 1633   APPEARANCEUR CLOUDY (A) 10/21/2016 1633   LABSPEC 1.013 10/21/2016 1633   PHURINE 6.0 10/21/2016 1633   GLUCOSEU NEGATIVE 10/21/2016 1633   HGBUR NEGATIVE 10/21/2016 1633   BILIRUBINUR negative 06/16/2020 1636   BILIRUBINUR Negative 08/26/2015 1256   KETONESUR negative 06/16/2020 1636   KETONESUR NEGATIVE 10/21/2016 1633   PROTEINUR negative 04/16/2020 1434   PROTEINUR NEGATIVE  10/21/2016 1633   UROBILINOGEN 0.2 06/16/2020 1636   UROBILINOGEN 0.2 02/26/2014 1008   NITRITE Positive (A) 12/15/2020 1538   NITRITE POSITIVE (A) 10/21/2016 1633   LEUKOCYTESUR Negative 12/15/2020 1538   Sepsis Labs Recent Labs  Lab 04/24/23 1550 04/25/23 0608 04/26/23 0556 04/27/23 0554  WBC 4.3 5.1 5.1 5.3   Microbiology No results found for this or any previous visit (from the past 240 hour(s)).   Time coordinating discharge: Over 30 minutes  SIGNED:   Huey Bienenstock, MD  [facility] 04/27/2023, 10:26 AM Pager   If 7PM-7AM, please contact night-coverage [email or web address]

## 2023-04-30 ENCOUNTER — Encounter: Payer: Self-pay | Admitting: *Deleted

## 2023-04-30 ENCOUNTER — Telehealth: Payer: Self-pay | Admitting: *Deleted

## 2023-04-30 NOTE — Transitions of Care (Post Inpatient/ED Visit) (Signed)
   04/30/2023  Name: [name] MRN: [id number] DOB: 1933-06-19  Today's TOC FU Call Status: Today's TOC FU Call Status:: Unsuccessul Call (1st Attempt) Unsuccessful Call (1st Attempt) Date: 04/30/23  Attempted to reach the patient regarding the most recent Inpatient visit; left HIPAA compliant voice message requesting call back  Follow Up Plan: Additional outreach attempts will be made to reach the patient to complete the Transitions of Care (Post Inpatient visit) call.   Caryl Pina, RN, BSN, CCRN Alumnus RN CM Care Coordination/ Transition of Care- [facility] Care Management [phone]: direct office

## 2023-05-01 ENCOUNTER — Encounter: Payer: Self-pay | Admitting: *Deleted

## 2023-05-01 ENCOUNTER — Telehealth: Payer: Self-pay | Admitting: Internal Medicine

## 2023-05-01 ENCOUNTER — Telehealth: Payer: Self-pay | Admitting: *Deleted

## 2023-05-01 DIAGNOSIS — D649 Anemia, unspecified: Secondary | ICD-10-CM | POA: Diagnosis not present

## 2023-05-01 DIAGNOSIS — F0390 Unspecified dementia without behavioral disturbance: Secondary | ICD-10-CM | POA: Diagnosis not present

## 2023-05-01 DIAGNOSIS — I129 Hypertensive chronic kidney disease with stage 1 through stage 4 chronic kidney disease, or unspecified chronic kidney disease: Secondary | ICD-10-CM | POA: Diagnosis not present

## 2023-05-01 DIAGNOSIS — H409 Unspecified glaucoma: Secondary | ICD-10-CM | POA: Diagnosis not present

## 2023-05-01 DIAGNOSIS — K2100 Gastro-esophageal reflux disease with esophagitis, without bleeding: Secondary | ICD-10-CM | POA: Diagnosis not present

## 2023-05-01 DIAGNOSIS — Z89019 Acquired absence of unspecified thumb: Secondary | ICD-10-CM | POA: Diagnosis not present

## 2023-05-01 DIAGNOSIS — K2990 Gastroduodenitis, unspecified, without bleeding: Secondary | ICD-10-CM | POA: Diagnosis not present

## 2023-05-01 DIAGNOSIS — M479 Spondylosis, unspecified: Secondary | ICD-10-CM | POA: Diagnosis not present

## 2023-05-01 DIAGNOSIS — K5900 Constipation, unspecified: Secondary | ICD-10-CM | POA: Diagnosis not present

## 2023-05-01 DIAGNOSIS — N1832 Chronic kidney disease, stage 3b: Secondary | ICD-10-CM | POA: Diagnosis not present

## 2023-05-01 DIAGNOSIS — G43909 Migraine, unspecified, not intractable, without status migrainosus: Secondary | ICD-10-CM | POA: Diagnosis not present

## 2023-05-01 DIAGNOSIS — M810 Age-related osteoporosis without current pathological fracture: Secondary | ICD-10-CM | POA: Diagnosis not present

## 2023-05-01 DIAGNOSIS — K449 Diaphragmatic hernia without obstruction or gangrene: Secondary | ICD-10-CM | POA: Diagnosis not present

## 2023-05-01 DIAGNOSIS — Z7982 Long term (current) use of aspirin: Secondary | ICD-10-CM | POA: Diagnosis not present

## 2023-05-01 DIAGNOSIS — I82511 Chronic embolism and thrombosis of right femoral vein: Secondary | ICD-10-CM | POA: Diagnosis not present

## 2023-05-01 DIAGNOSIS — K2270 Barrett's esophagus without dysplasia: Secondary | ICD-10-CM | POA: Diagnosis not present

## 2023-05-01 DIAGNOSIS — K2970 Gastritis, unspecified, without bleeding: Secondary | ICD-10-CM | POA: Diagnosis not present

## 2023-05-01 DIAGNOSIS — M2041 Other hammer toe(s) (acquired), right foot: Secondary | ICD-10-CM | POA: Diagnosis not present

## 2023-05-01 DIAGNOSIS — M2042 Other hammer toe(s) (acquired), left foot: Secondary | ICD-10-CM | POA: Diagnosis not present

## 2023-05-01 DIAGNOSIS — D509 Iron deficiency anemia, unspecified: Secondary | ICD-10-CM | POA: Diagnosis not present

## 2023-05-01 DIAGNOSIS — I2510 Atherosclerotic heart disease of native coronary artery without angina pectoris: Secondary | ICD-10-CM | POA: Diagnosis not present

## 2023-05-01 NOTE — Telephone Encounter (Signed)
HH ORDERS   Caller Name: [facility] Agency Name: [place] [employer] Phone #: [phone](secure)  Service Requested: home health PT (examples: OT/PT/Skilled Nursing/Social Work/Speech Therapy/Wound Care)  Frequency of Visits: 1X a week for 9 weeks

## 2023-05-01 NOTE — Transitions of Care (Post Inpatient/ED Visit) (Signed)
05/01/2023  Name: [name] MRN: [id number] DOB: May 28, 1933  Today's TOC FU Call Status: Today's TOC FU Call Status:: Successful TOC FU Call Competed TOC FU Call Complete Date: 05/01/23  Transition Care Management Follow-up Telephone Call Date of Discharge: 04/27/23 Discharge Facility: [facility]) Type of Discharge: Inpatient Admission Primary Inpatient Discharge Diagnosis:: dizziness/ confusion; severe anemia, esophagitis How have you been since you were released from the hospital?: Better (per caregiver Elnita Maxwell: "She is doing overall better.  I will make sure to follow up on this iron pill; I am not sure why it is not here with her other medications") Any questions or concerns?: Yes Patient Questions/Concerns:: caregiver reports patient does not have prescribed iron medication Patient Questions/Concerns Addressed: Other: (full medication review completed; provided education around importance of iron in setting of anemia; confirmed one refill available per EHR; confirmed caregiver will follow up with pharmacy to obtain)  Items Reviewed: Did you receive and understand the discharge instructions provided?: Yes (thoroughly reviewed with patient' neice/ caregiver who verbalizes good understanding of same) Medications obtained,verified, and reconciled?: Yes (Medications Reviewed) (Full medication reconciliation/ review completed; needs to refill iron; confirmed patient obtained/ is taking all newly Rx'd medications as instructed; neice-manages medications and denies questions/ concerns around medications today) Any new allergies since your discharge?: No Dietary orders reviewed?: Yes Type of Diet Ordered:: "Very poor appetite; eats very little; we give her bland foods that she likes to eat" neice confirms patient receives MOW Do you have support at home?: Yes People in Home: alone Name of Support/Comfort Primary Source: Resides alone at baseline; neice/ caregiver reports patient  essentially independent in self-care activities; has medicaid PCS 7 days per week; gets MOW; supportive neice lives nearby and checks on patient every day-- assists as/ if needed/ indicated  Medications Reviewed Today: Medications Reviewed Today     Reviewed by Michaela Corner, RN (Registered Nurse) on 05/01/23 at 1208  Med List Status: <None>   Medication Order Taking? Sig Documenting Provider Last Dose Status Informant  acetaminophen (TYLENOL) 500 MG tablet [id number]  Take 500 mg by mouth daily as needed for mild pain or moderate pain. [provider]  Active Family Member, Pharmacy Records  aspirin EC 81 MG tablet [id number]  Take 1 tablet (81 mg total) by mouth daily. Swallow whole. Elgergawy, Leana Roe, MD  Active   docusate sodium (COLACE) 100 MG capsule [id number]  Take 1 capsule (100 mg total) by mouth daily. Elgergawy, Leana Roe, MD  Active   famotidine (PEPCID) 20 MG tablet [id number]  Take 1 tablet (20 mg total) by mouth 3 times/day as needed-between meals & bedtime for heartburn or indigestion. Elgergawy, Leana Roe, MD  Active   FEROSUL 325 (65 Fe) MG tablet [id number]  TAKE 1 TABLET(325 MG) BY MOUTH DAILY WITH BREAKFAST Etta Grandchild, MD  Active Pharmacy Records, Family Member  gabapentin (NEURONTIN) 400 MG capsule [id number]  Take 1 capsule (400 mg total) by mouth at bedtime. Etta Grandchild, MD  Active Pharmacy Records, Family Member  latanoprost (XALATAN) 0.005 % ophthalmic solution [id number]  INT 1 GTT IN OD QD [provider]  Active Pharmacy Records, Family Member  metoprolol succinate (TOPROL-XL) 25 MG 24 hr tablet [id number]  Take 1 tablet (25 mg total) by mouth daily. Pincus Sanes, MD  Active Pharmacy Records, Family Member  Multiple Vitamin (MULTIVITAMIN) tablet [id number]  Take 1 tablet by mouth daily. [provider]  Active Family Member, Pharmacy Records  Multiple Vitamins-Minerals [facility] SKIN &  NAILS ADVANCED) TABS [id number]  Take 1 mg by mouth daily.  [provider]  Active Family Member, Pharmacy Records  Olopatadine HCl (PATADAY OP) [id number]  Apply 1 drop to eye daily. [provider]  Active Family Member, Pharmacy Records  pantoprazole (PROTONIX) 40 MG tablet [id number]  Take 1 tablet (40 mg total) by mouth 2 (two) times daily. Elgergawy, Leana Roe, MD  Active   polyethylene glycol powder (GLYCOLAX/MIRALAX) 17 GM/SCOOP powder [id number]  Dissolve 17 g as directed and drink daily. Elgergawy, Leana Roe, MD  Active   potassium chloride SA (KLOR-CON M) 20 MEQ tablet [id number]  TAKE 1 TABLET(20 MEQ) BY MOUTH DAILY Etta Grandchild, MD  Active Pharmacy Records, Family Member  traMADol-acetaminophen (ULTRACET) 37.5-325 MG tablet [id number]  TAKE 1 TABLET BY MOUTH EVERY 6 HOURS AS NEEDED  Patient taking differently: Take 1 tablet by mouth daily.   Etta Grandchild, MD  Active Family Member, Pharmacy Records  verapamil (CALAN-SR) 240 MG CR tablet [id number]  TAKE 1 TABLET(240 MG) BY MOUTH DAILY Etta Grandchild, MD  Active Pharmacy Records, Family Member            Home Care and Equipment/Supplies: Were Home Health Services Ordered?: Yes Name of Home Health Agency:: [facility] Has Agency set up a time to come to your home?: Yes First Home Health Visit Date: 05/01/23 Any new equipment or medical supplies ordered?: No  Functional Questionnaire: Do you need assistance with bathing/showering or dressing?: Yes (has medicaid PCS aide-- assists with all care needs, along with patient's neice who lives nearby) Do you need assistance with meal preparation?: Yes (has medicaid PCS aide-- assists with all care needs, along with patient's neice who lives nearby) Do you need assistance with eating?: No Do you have difficulty maintaining continence: No (has medicaid PCS aide-- assists with all care needs, along with patient's neice who lives nearby) Do you need assistance with getting out of bed/getting out of a chair/moving?: No (has  medicaid PCS aide-- assists with all care needs, along with patient's neice who lives nearby) Do you have difficulty managing or taking your medications?: Yes (neice manages all aspects of medication administration)  Follow up appointments reviewed: PCP Follow-up appointment confirmed?: Yes Date of PCP follow-up appointment?: 05/10/23 Follow-up Provider: PCP Specialist Hospital Follow-up appointment confirmed?: NA (verified not indicated per hospital discharging provider discharge notes) Do you need transportation to your follow-up appointment?: No Do you understand care options if your condition(s) worsen?: Yes-patient verbalized understanding  SDOH Interventions Today    Flowsheet Row Most Recent Value  SDOH Interventions   Food Insecurity Interventions Intervention Not Indicated  [gets MOW]  Transportation Interventions Intervention Not Indicated  [neice Elnita Maxwell provides all transportation]      TOC Interventions Today    Flowsheet Row Most Recent Value  TOC Interventions   TOC Interventions Discussed/Reviewed TOC Interventions Discussed  [neice declines need for ongoing/ further care coordination outreach,  no care coordination needs identified at time of TOC call today,  provided my direct contact information should questions/ concerns/ needs arise post-TOC call]      Interventions Today    Flowsheet Row Most Recent Value  Chronic Disease   Chronic disease during today's visit Other  [severe anemmia]  General Interventions   General Interventions Discussed/Reviewed General Interventions Discussed, Doctor Visits, Communication with, Durable Medical Equipment (DME)  Doctor Visits Discussed/Reviewed Doctor Visits Discussed  Durable Medical Equipment (DME) Dan Humphreys, Bed side commode  Communication with PCP/Specialists  Education Interventions  Education Provided Provided Education  Provided Verbal Education On Medication, When to see the doctor, Other  [purpose and importance  of iron in setting of anemia,  options to ensure patient is taking iron]  Nutrition Interventions   Nutrition Discussed/Reviewed Nutrition Discussed  Pharmacy Interventions   Pharmacy Dicussed/Reviewed Pharmacy Topics Discussed  [Full medication review with updating medication list in EHR per patient report]  Safety Interventions   Safety Discussed/Reviewed Safety Discussed      Caryl Pina, RN, BSN, CCRN Alumnus RN CM Care Coordination/ Transition of Care- [facility] Care Management [phone]: direct office

## 2023-05-01 NOTE — Telephone Encounter (Signed)
Verbal orders given to [facility] as requested.

## 2023-05-02 ENCOUNTER — Telehealth: Payer: Self-pay

## 2023-05-02 DIAGNOSIS — N1832 Chronic kidney disease, stage 3b: Secondary | ICD-10-CM | POA: Diagnosis not present

## 2023-05-02 DIAGNOSIS — D649 Anemia, unspecified: Secondary | ICD-10-CM | POA: Diagnosis not present

## 2023-05-02 DIAGNOSIS — I2510 Atherosclerotic heart disease of native coronary artery without angina pectoris: Secondary | ICD-10-CM | POA: Diagnosis not present

## 2023-05-02 DIAGNOSIS — I129 Hypertensive chronic kidney disease with stage 1 through stage 4 chronic kidney disease, or unspecified chronic kidney disease: Secondary | ICD-10-CM | POA: Diagnosis not present

## 2023-05-02 DIAGNOSIS — K2990 Gastroduodenitis, unspecified, without bleeding: Secondary | ICD-10-CM | POA: Diagnosis not present

## 2023-05-02 DIAGNOSIS — K2100 Gastro-esophageal reflux disease with esophagitis, without bleeding: Secondary | ICD-10-CM | POA: Diagnosis not present

## 2023-05-02 NOTE — Telephone Encounter (Signed)
[facility] calling for verbal orders for OT 1w8    Domingo Pulse can be reached at [phone], VM is secure can lvm .

## 2023-05-03 NOTE — Telephone Encounter (Signed)
Verbal orders given to [facility] as requested

## 2023-05-09 DIAGNOSIS — D649 Anemia, unspecified: Secondary | ICD-10-CM | POA: Diagnosis not present

## 2023-05-09 DIAGNOSIS — K2990 Gastroduodenitis, unspecified, without bleeding: Secondary | ICD-10-CM | POA: Diagnosis not present

## 2023-05-09 DIAGNOSIS — I129 Hypertensive chronic kidney disease with stage 1 through stage 4 chronic kidney disease, or unspecified chronic kidney disease: Secondary | ICD-10-CM | POA: Diagnosis not present

## 2023-05-09 DIAGNOSIS — N1832 Chronic kidney disease, stage 3b: Secondary | ICD-10-CM | POA: Diagnosis not present

## 2023-05-09 DIAGNOSIS — K2100 Gastro-esophageal reflux disease with esophagitis, without bleeding: Secondary | ICD-10-CM | POA: Diagnosis not present

## 2023-05-09 DIAGNOSIS — I2510 Atherosclerotic heart disease of native coronary artery without angina pectoris: Secondary | ICD-10-CM | POA: Diagnosis not present

## 2023-05-10 ENCOUNTER — Encounter: Payer: Self-pay | Admitting: Internal Medicine

## 2023-05-10 ENCOUNTER — Ambulatory Visit (INDEPENDENT_AMBULATORY_CARE_PROVIDER_SITE_OTHER): Payer: Medicare Other | Admitting: Internal Medicine

## 2023-05-10 ENCOUNTER — Other Ambulatory Visit: Payer: Self-pay | Admitting: Internal Medicine

## 2023-05-10 VITALS — BP 120/60 | HR 77 | Temp 98.8°F | Resp 16 | Ht <= 58 in | Wt 116.0 lb

## 2023-05-10 DIAGNOSIS — D6869 Other thrombophilia: Secondary | ICD-10-CM | POA: Diagnosis not present

## 2023-05-10 DIAGNOSIS — K5901 Slow transit constipation: Secondary | ICD-10-CM | POA: Diagnosis not present

## 2023-05-10 DIAGNOSIS — I82511 Chronic embolism and thrombosis of right femoral vein: Secondary | ICD-10-CM | POA: Diagnosis not present

## 2023-05-10 DIAGNOSIS — I2510 Atherosclerotic heart disease of native coronary artery without angina pectoris: Secondary | ICD-10-CM | POA: Diagnosis not present

## 2023-05-10 DIAGNOSIS — I10 Essential (primary) hypertension: Secondary | ICD-10-CM | POA: Diagnosis not present

## 2023-05-10 DIAGNOSIS — D508 Other iron deficiency anemias: Secondary | ICD-10-CM

## 2023-05-10 DIAGNOSIS — N1832 Chronic kidney disease, stage 3b: Secondary | ICD-10-CM

## 2023-05-10 LAB — IBC + FERRITIN
Ferritin: 21.4 ng/mL (ref 10.0–291.0)
Iron: 142 ug/dL (ref 42–145)
Saturation Ratios: 39.2 % (ref 20.0–50.0)
TIBC: 362.6 ug/dL (ref 250.0–450.0)
Transferrin: 259 mg/dL (ref 212.0–360.0)

## 2023-05-10 LAB — CBC WITH DIFFERENTIAL/PLATELET
Basophils Absolute: 0.1 10*3/uL (ref 0.0–0.1)
Basophils Relative: 1 % (ref 0.0–3.0)
Eosinophils Absolute: 0.2 10*3/uL (ref 0.0–0.7)
Eosinophils Relative: 3.6 % (ref 0.0–5.0)
HCT: 30.3 % — ABNORMAL LOW (ref 36.0–46.0)
Hemoglobin: 9.7 g/dL — ABNORMAL LOW (ref 12.0–15.0)
Lymphocytes Relative: 11.6 % — ABNORMAL LOW (ref 12.0–46.0)
Lymphs Abs: 0.8 10*3/uL (ref 0.7–4.0)
MCHC: 32.2 g/dL (ref 30.0–36.0)
MCV: 83 fl (ref 78.0–100.0)
Monocytes Absolute: 0.4 10*3/uL (ref 0.1–1.0)
Monocytes Relative: 6.6 % (ref 3.0–12.0)
Neutro Abs: 5 10*3/uL (ref 1.4–7.7)
Neutrophils Relative %: 77.2 % — ABNORMAL HIGH (ref 43.0–77.0)
Platelets: 427 10*3/uL — ABNORMAL HIGH (ref 150.0–400.0)
RBC: 3.65 Mil/uL — ABNORMAL LOW (ref 3.87–5.11)
RDW: 17.9 % — ABNORMAL HIGH (ref 11.5–15.5)
WBC: 6.5 10*3/uL (ref 4.0–10.5)

## 2023-05-10 LAB — D-DIMER, QUANTITATIVE: D-Dimer, Quant: 4.92 mcg/mL FEU — ABNORMAL HIGH (ref <0.50)

## 2023-05-10 LAB — MAGNESIUM: Magnesium: 2 mg/dL (ref 1.5–2.5)

## 2023-05-10 LAB — TSH: TSH: 1.9 u[IU]/mL (ref 0.35–5.50)

## 2023-05-10 MED ORDER — METOPROLOL SUCCINATE ER 25 MG PO TB24
25.0000 mg | ORAL_TABLET | Freq: Every day | ORAL | 1 refills | Status: DC
Start: 2023-05-10 — End: 2023-11-05

## 2023-05-10 NOTE — Progress Notes (Signed)
Subjective:  Patient ID: [name], female    DOB: June 24, 1933  Age: 89 y.o. MRN: [id number]  CC: Anemia, Coronary Artery Disease, and Hypertension   HPI [name] presents for a f/up ----  Since discharge she complains of worsening LE pain and swelling. Xarelto was discontinued.  She complains of constipation and is taking MiraLAX.  Her home health aide has not noticed any blood in her stool.  PCP: Etta Grandchild, MD   Admit date: 04/23/2023 Discharge date: 04/27/2023   Admitted From: (Home) Disposition:  (Home)   Recommendations for Outpatient Follow-up:  Follow up with PCP in 1-2 weeks Please obtain BMP/CBC in one week, can resume aspirin in 2 weeks if hemoglobin remained stable. Also has been discontinued as she is being treated for total of 7 months for acute DVT, and feet lower extremity venous Doppler with no further evidence of any DVT.     Home Health: (YES)   Discharge Condition: (Stable) CODE STATUS: (FULL) Diet recommendation: Heart Healthy   Brief/Interim Summary:   89 y.o. female with medical history significant of dementia, CKD 3B, GERD, Barrett's esophagitis, GI bleed, hypertension, CAD, anemia, DVT, migraines, glaucoma presenting with dizziness and confusion.  Patient has known history of upper GI bleed workup in the ER consistent with severe anemia and she was admitted for further workup.  Her EGD significant for erosive esophagitis, so CT chest was obtained, significant for very large hiatal hernia, likely contributing to her dyspepsia and erosive esophagitis.    Symptomatic anemia in setting of upper GI bleed, from erosive esophagitis, gastritis, in the setting of large hiatal hernia .  With the patient on Xarelto. -patient with history of Barrett's esophagus and previous UGI bleed, also on Xarelto. -Hemoglobin low at 5.  5 on admission, required 2 units PRBC transfusion, hemoglobin remained stable, monitor and transfuse as needed. -GI input greatly  appreciated, endoscopy significant for gastritis, esophagitis. -CT chest with IV contrast significant for very large mixed type paraesophageal and hiatal hernia laded esophagus with luminal fluid and debris extending up to the thoracic inlet, so her hernia contributing to some of her upper GI bleed, including severe dyspepsia with uncontrolled reflux, regurg, causing her positive esophagitis, at her age and frailty she will not be a surgical candidate, will continue with conservative measures including high-dose PPI, and Pepcid as needed.   dementia  - Noted,  Delirium precautions, mentation at baseline.   CKD 3B > Stable     Hypertension  -Initially holding meds in the setting of dizziness, lightheadedness, but now blood pressure much improved and elevated, as well she is tachycardic, so she has been resumed on her Cardizem and metoprolol at time of discharge, hydrochlorothiazide has been held.    Outpatient Medications Prior to Visit  Medication Sig Dispense Refill   acetaminophen (TYLENOL) 500 MG tablet Take 500 mg by mouth daily as needed for mild pain or moderate pain.     FEROSUL 325 (65 Fe) MG tablet TAKE 1 TABLET(325 MG) BY MOUTH DAILY WITH BREAKFAST 90 tablet 1   gabapentin (NEURONTIN) 400 MG capsule Take 1 capsule (400 mg total) by mouth at bedtime. 90 capsule 1   latanoprost (XALATAN) 0.005 % ophthalmic solution INT 1 GTT IN OD QD     Multiple Vitamin (MULTIVITAMIN) tablet Take 1 tablet by mouth daily.     Multiple Vitamins-Minerals (HAIR SKIN & NAILS ADVANCED) TABS Take 1 mg by mouth daily.     Olopatadine HCl (PATADAY OP) Apply 1  drop to eye daily.     pantoprazole (PROTONIX) 40 MG tablet Take 1 tablet (40 mg total) by mouth 2 (two) times daily. 180 tablet 0   potassium chloride SA (KLOR-CON M) 20 MEQ tablet TAKE 1 TABLET(20 MEQ) BY MOUTH DAILY 30 tablet 3   traMADol-acetaminophen (ULTRACET) 37.5-325 MG tablet TAKE 1 TABLET BY MOUTH EVERY 6 HOURS AS NEEDED (Patient taking  differently: Take 1 tablet by mouth daily.) 60 tablet 5   verapamil (CALAN-SR) 240 MG CR tablet TAKE 1 TABLET(240 MG) BY MOUTH DAILY 90 tablet 1   aspirin EC 81 MG tablet Take 1 tablet (81 mg total) by mouth daily. Swallow whole. 30 tablet 12   metoprolol succinate (TOPROL-XL) 25 MG 24 hr tablet Take 1 tablet (25 mg total) by mouth daily. 90 tablet 3   docusate sodium (COLACE) 100 MG capsule Take 1 capsule (100 mg total) by mouth daily. (Patient not taking: Reported on 05/10/2023) 90 capsule 0   famotidine (PEPCID) 20 MG tablet Take 1 tablet (20 mg total) by mouth 3 times/day as needed-between meals & bedtime for heartburn or indigestion. (Patient not taking: Reported on 05/01/2023)     polyethylene glycol powder (GLYCOLAX/MIRALAX) 17 GM/SCOOP powder Dissolve 17 g as directed and drink daily. (Patient not taking: Reported on 05/10/2023) 238 g 0   No facility-administered medications prior to visit.    ROS Review of Systems  Constitutional:  Positive for fatigue. Negative for chills, diaphoresis and unexpected weight change.  HENT: Negative.    Eyes: Negative.   Respiratory:  Negative for cough, chest tightness, shortness of breath and wheezing.   Cardiovascular:  Positive for leg swelling. Negative for chest pain and palpitations.  Gastrointestinal:  Positive for constipation. Negative for abdominal pain, blood in stool, diarrhea, nausea and vomiting.  Endocrine: Negative.   Genitourinary: Negative.  Negative for difficulty urinating.  Musculoskeletal:  Negative for arthralgias, joint swelling and myalgias.  Skin: Negative.  Negative for color change.  Neurological: Negative.  Negative for dizziness, weakness, light-headedness and numbness.  Hematological:  Negative for adenopathy. Does not bruise/bleed easily.  Psychiatric/Behavioral:  Positive for confusion and decreased concentration.     Objective:  BP 120/60 (BP Location: Left Arm, Patient Position: Sitting, Cuff Size: Normal)   Pulse  77   Temp 98.8 F (37.1 C) (Oral)   Resp 16   Ht 4\' 10"  (1.473 m)   Wt 116 lb (52.6 kg)   SpO2 98%   BMI 24.24 kg/m   BP Readings from Last 3 Encounters:  05/10/23 120/60  04/27/23 (!) 147/79  03/05/23 132/76    Wt Readings from Last 3 Encounters:  05/10/23 116 lb (52.6 kg)  04/23/23 116 lb (52.6 kg)  04/24/22 116 lb (52.6 kg)    Physical Exam Vitals reviewed.  HENT:     Mouth/Throat:     Mouth: Mucous membranes are moist.  Eyes:     General: No scleral icterus.    Conjunctiva/sclera: Conjunctivae normal.  Cardiovascular:     Rate and Rhythm: Normal rate and regular rhythm.     Heart sounds: No murmur heard. Pulmonary:     Effort: Pulmonary effort is normal.     Breath sounds: No stridor. No wheezing, rhonchi or rales.  Abdominal:     General: Abdomen is flat.  Musculoskeletal:     Cervical back: Neck supple.     Right lower leg: 1+ Pitting Edema present.     Left lower leg: 1+ Pitting Edema present.  Lymphadenopathy:  Cervical: No cervical adenopathy.  Skin:    General: Skin is warm and dry.  Neurological:     General: No focal deficit present.     Mental Status: She is alert. Mental status is at baseline.  Psychiatric:        Mood and Affect: Mood normal.        Behavior: Behavior normal.     Lab Results  Component Value Date   WBC 6.5 05/10/2023   HGB 9.7 (L) 05/10/2023   HCT 30.3 (L) 05/10/2023   PLT 427.0 (H) 05/10/2023   GLUCOSE 118 (H) 04/27/2023   CHOL 175 11/03/2019   TRIG 79 11/03/2019   HDL 82 11/03/2019   LDLCALC 78 11/03/2019   ALT 13 04/23/2023   AST 15 04/23/2023   NA 143 04/27/2023   K 4.0 04/27/2023   CL 110 04/27/2023   CREATININE 0.84 04/27/2023   BUN 19 04/27/2023   CO2 25 04/27/2023   TSH 1.90 05/10/2023   INR 1.3 (H) 04/23/2023   HGBA1C 5.4 05/30/2017    CT Head Wo Contrast  Result Date: 04/23/2023 CLINICAL DATA:  Altered mental status EXAM: CT HEAD WITHOUT CONTRAST TECHNIQUE: Contiguous axial images were  obtained from the base of the skull through the vertex without intravenous contrast. RADIATION DOSE REDUCTION: This exam was performed according to the departmental dose-optimization program which includes automated exposure control, adjustment of the mA and/or kV according to patient size and/or use of iterative reconstruction technique. COMPARISON:  CT head 06/15/10 FINDINGS: Brain: No evidence of acute infarction, hemorrhage, hydrocephalus, extra-axial collection or mass lesion/mass effect. Sequela of mild chronic microvascular ischemic change. Vascular: No hyperdense vessel or unexpected calcification. Skull: Normal. Negative for fracture or focal lesion. Sinuses/Orbits: No middle ear or mastoid effusion. Paranasal sinuses are clear. Bilateral lens replacement. Orbits are otherwise unremarkable. Other: None. IMPRESSION: No acute intracranial abnormality. Electronically Signed   By: Lorenza Cambridge M.D.   On: 04/23/2023 12:24   DG Chest Portable 1 View  Result Date: 04/23/2023 CLINICAL DATA:  Dizziness.  Infection EXAM: PORTABLE CHEST 1 VIEW COMPARISON:  X-ray 08/07/2022 FINDINGS: Film is rotated to the right. Tortuous and ectatic aorta. Normal cardiopericardial silhouette. Presumed hiatal hernia. No consolidation, pneumothorax or effusion. No edema. Overlapping cardiac leads. Degenerative changes seen of the shoulders. IMPRESSION: Rotated radiograph.  Hiatal hernia. No consolidation or pneumothorax. Electronically Signed   By: Karen Kays M.D.   On: 04/23/2023 11:30    Assessment & Plan:   Iron deficiency anemia secondary to inadequate dietary iron intake- The anemia has improved and iron level is normal. -     IBC + Ferritin; Future -     CBC with Differential/Platelet; Future  Acquired thrombophilia (HCC)- Will discontinue the aspirin and start low-dose DOAC to prevent recurrent thromboembolic events. -     D-dimer, quantitative; Future -     Rivaroxaban; Take 1 tablet (10 mg total) by mouth  daily.  Dispense: 90 tablet; Refill: 0  Chronic deep vein thrombosis (DVT) of right femoral vein (HCC)- Her D-dimer is elevated.  Will restart the DOAC. -     D-dimer, quantitative; Future -     Rivaroxaban; Take 1 tablet (10 mg total) by mouth daily.  Dispense: 90 tablet; Refill: 0  Constipation due to slow transit- Labs are negative for secondary causes. -     TSH; Future -     Magnesium; Future  Essential hypertension -     TSH; Future -     Metoprolol  Succinate ER; Take 1 tablet (25 mg total) by mouth daily.  Dispense: 90 tablet; Refill: 1  Stage 3b chronic kidney disease (HCC)  Coronary artery disease involving native coronary artery of native heart without angina pectoris- She has had no recent angina. -     Metoprolol Succinate ER; Take 1 tablet (25 mg total) by mouth daily.  Dispense: 90 tablet; Refill: 1     Follow-up: Return in about 4 months (around 09/10/2023).  Sanda Linger, MD

## 2023-05-10 NOTE — Patient Instructions (Signed)
Iron Deficiency Anemia, Adult  Iron deficiency anemia is a condition in which the concentration of red blood cells or hemoglobin in the blood is below normal because of too little iron. Hemoglobin is a substance in red blood cells that carries oxygen to the body's tissues. When the concentration of red blood cells or hemoglobin is too low, not enough oxygen reaches these tissues. Iron deficiency anemia is usually long-lasting, and it develops over time. It may or may not cause symptoms. It is a common type of anemia. What are the causes? This condition may be caused by: Not enough iron in the diet. Abnormal absorption in the gut. Blood loss. What increases the risk? You are more likely to develop this condition if you get menstrual periods (menstruate) or are pregnant. What are the signs or symptoms? Symptoms of this condition may include: Pale skin, lips, and nail beds. Weakness, dizziness, and getting tired easily. Shortness of breath when moving or exercising. Cold hands or feet. Mild anemia may not cause any symptoms. How is this diagnosed? This condition is diagnosed based on: Your medical history. A physical exam. Blood tests. How is this treated? This condition is treated by correcting the cause of your iron deficiency. Treatment may involve: Adding iron-rich foods to your diet. Taking iron supplements. If you are pregnant or breastfeeding, you may need to take extra iron because your normal diet usually does not provide the amount of iron that you need. Increasing vitamin C intake. Vitamin C helps your body absorb iron. Your health care provider may recommend that you take iron supplements along with a glass of orange juice or a vitamin C supplement. Medicines to make heavy menstrual flow lighter. Surgery or additional testing procedures to determine the cause of your anemia. You may need repeat blood tests to determine whether treatment is working. If the treatment does not  seem to be working, you may need more tests. Follow these instructions at home: Medicines Take over-the-counter and prescription medicines only as told by your health care provider. This includes iron supplements and vitamins. This is important because too much iron can be harmful. For the best iron absorption, you should take iron supplements when your stomach is empty. If you cannot tolerate them on an empty stomach, you may need to take them with food. Do not drink milk or take antacids at the same time as your iron supplements. Milk and antacids may interfere with how your body absorbs iron. Iron supplements may turn stool (feces) a darker color and it may appear black. If you cannot tolerate taking iron supplements by mouth, talk with your health care provider about taking them through an IV or through an injection into a muscle. Eating and drinking Talk with your health care provider before changing your diet. Your provider may recommend that you eat foods that contain a lot of iron, such as: Liver. Low-fat (lean) beef. Breads and cereals that have iron added to them (are fortified). Eggs. Dried fruit. Dark green, leafy vegetables. To help your body use the iron from iron-rich foods, eat those foods at the same time as fresh fruits and vegetables that are high in vitamin C. Foods that are high in vitamin C include: Oranges. Peppers. Tomatoes. Mangoes. Managing constipation If you are taking an iron supplement, it may cause constipation. To prevent or treat constipation, you may need to: Drink enough fluid to keep your urine pale yellow. Take over-the-counter or prescription medicines. Eat foods that are high in fiber, such   as beans, whole grains, and fresh fruits and vegetables. Limit foods that are high in fat and processed sugars, such as fried or sweet foods. General instructions Return to your normal activities as told by your health care provider. Ask your health care provider  what activities are safe for you. Keep all follow-up visits. Contact a health care provider if: You feel nauseous or you vomit. You feel weak. You become light-headed when getting up from a sitting or lying down position. You have unexplained sweating. You develop symptoms of constipation. You have a heaviness in your chest. You have trouble breathing with physical activity. Get help right away if: You faint. If this happens, do not drive yourself to the hospital. You have an irregular or rapid heartbeat. Summary Iron deficiency anemia is a condition in which the concentration of red blood cells or hemoglobin in the blood is below normal because of too little iron. This condition is treated by correcting the cause of your iron deficiency. Take over-the-counter and prescription medicines only as told by your health care provider. This includes iron supplements and vitamins. To help your body use the iron from iron-rich foods, eat those foods at the same time as fresh fruits and vegetables that are high in vitamin C. Seek medical help if you have signs or symptoms of worsening anemia. This information is not intended to replace advice given to you by your health care provider. Make sure you discuss any questions you have with your health care provider. Document Revised: 01/18/2022 Document Reviewed: 01/18/2022 Elsevier Patient Education  2023 Elsevier Inc.  

## 2023-05-11 DIAGNOSIS — K2990 Gastroduodenitis, unspecified, without bleeding: Secondary | ICD-10-CM | POA: Diagnosis not present

## 2023-05-11 DIAGNOSIS — K2100 Gastro-esophageal reflux disease with esophagitis, without bleeding: Secondary | ICD-10-CM | POA: Diagnosis not present

## 2023-05-11 DIAGNOSIS — D649 Anemia, unspecified: Secondary | ICD-10-CM | POA: Diagnosis not present

## 2023-05-11 DIAGNOSIS — N1832 Chronic kidney disease, stage 3b: Secondary | ICD-10-CM | POA: Diagnosis not present

## 2023-05-11 DIAGNOSIS — I129 Hypertensive chronic kidney disease with stage 1 through stage 4 chronic kidney disease, or unspecified chronic kidney disease: Secondary | ICD-10-CM | POA: Diagnosis not present

## 2023-05-11 DIAGNOSIS — I2510 Atherosclerotic heart disease of native coronary artery without angina pectoris: Secondary | ICD-10-CM | POA: Diagnosis not present

## 2023-05-11 MED ORDER — RIVAROXABAN 10 MG PO TABS
10.0000 mg | ORAL_TABLET | Freq: Every day | ORAL | 0 refills | Status: DC
Start: 2023-05-11 — End: 2023-08-07

## 2023-05-15 DIAGNOSIS — I2510 Atherosclerotic heart disease of native coronary artery without angina pectoris: Secondary | ICD-10-CM | POA: Diagnosis not present

## 2023-05-15 DIAGNOSIS — K2100 Gastro-esophageal reflux disease with esophagitis, without bleeding: Secondary | ICD-10-CM | POA: Diagnosis not present

## 2023-05-15 DIAGNOSIS — N1832 Chronic kidney disease, stage 3b: Secondary | ICD-10-CM | POA: Diagnosis not present

## 2023-05-15 DIAGNOSIS — D649 Anemia, unspecified: Secondary | ICD-10-CM | POA: Diagnosis not present

## 2023-05-15 DIAGNOSIS — K2990 Gastroduodenitis, unspecified, without bleeding: Secondary | ICD-10-CM | POA: Diagnosis not present

## 2023-05-15 DIAGNOSIS — I129 Hypertensive chronic kidney disease with stage 1 through stage 4 chronic kidney disease, or unspecified chronic kidney disease: Secondary | ICD-10-CM | POA: Diagnosis not present

## 2023-05-17 DIAGNOSIS — N1832 Chronic kidney disease, stage 3b: Secondary | ICD-10-CM | POA: Diagnosis not present

## 2023-05-17 DIAGNOSIS — I129 Hypertensive chronic kidney disease with stage 1 through stage 4 chronic kidney disease, or unspecified chronic kidney disease: Secondary | ICD-10-CM | POA: Diagnosis not present

## 2023-05-17 DIAGNOSIS — D649 Anemia, unspecified: Secondary | ICD-10-CM | POA: Diagnosis not present

## 2023-05-17 DIAGNOSIS — I2510 Atherosclerotic heart disease of native coronary artery without angina pectoris: Secondary | ICD-10-CM | POA: Diagnosis not present

## 2023-05-17 DIAGNOSIS — K2990 Gastroduodenitis, unspecified, without bleeding: Secondary | ICD-10-CM | POA: Diagnosis not present

## 2023-05-17 DIAGNOSIS — K2100 Gastro-esophageal reflux disease with esophagitis, without bleeding: Secondary | ICD-10-CM | POA: Diagnosis not present

## 2023-05-18 ENCOUNTER — Other Ambulatory Visit: Payer: Self-pay | Admitting: Internal Medicine

## 2023-05-18 DIAGNOSIS — K219 Gastro-esophageal reflux disease without esophagitis: Secondary | ICD-10-CM

## 2023-05-23 ENCOUNTER — Telehealth: Payer: Self-pay | Admitting: Internal Medicine

## 2023-05-23 DIAGNOSIS — K2990 Gastroduodenitis, unspecified, without bleeding: Secondary | ICD-10-CM | POA: Diagnosis not present

## 2023-05-23 DIAGNOSIS — K2100 Gastro-esophageal reflux disease with esophagitis, without bleeding: Secondary | ICD-10-CM | POA: Diagnosis not present

## 2023-05-23 DIAGNOSIS — N1832 Chronic kidney disease, stage 3b: Secondary | ICD-10-CM | POA: Diagnosis not present

## 2023-05-23 DIAGNOSIS — D649 Anemia, unspecified: Secondary | ICD-10-CM | POA: Diagnosis not present

## 2023-05-23 DIAGNOSIS — I129 Hypertensive chronic kidney disease with stage 1 through stage 4 chronic kidney disease, or unspecified chronic kidney disease: Secondary | ICD-10-CM | POA: Diagnosis not present

## 2023-05-23 DIAGNOSIS — I2510 Atherosclerotic heart disease of native coronary artery without angina pectoris: Secondary | ICD-10-CM | POA: Diagnosis not present

## 2023-05-23 NOTE — Telephone Encounter (Signed)
Jill Side with [facility] Called Phone:  [phone]  Patient had a bp of 158/72 - asymptomatic - has had blood pressure medication

## 2023-05-25 DIAGNOSIS — N1832 Chronic kidney disease, stage 3b: Secondary | ICD-10-CM | POA: Diagnosis not present

## 2023-05-25 DIAGNOSIS — K2990 Gastroduodenitis, unspecified, without bleeding: Secondary | ICD-10-CM | POA: Diagnosis not present

## 2023-05-25 DIAGNOSIS — I129 Hypertensive chronic kidney disease with stage 1 through stage 4 chronic kidney disease, or unspecified chronic kidney disease: Secondary | ICD-10-CM | POA: Diagnosis not present

## 2023-05-25 DIAGNOSIS — I2510 Atherosclerotic heart disease of native coronary artery without angina pectoris: Secondary | ICD-10-CM | POA: Diagnosis not present

## 2023-05-25 DIAGNOSIS — K2100 Gastro-esophageal reflux disease with esophagitis, without bleeding: Secondary | ICD-10-CM | POA: Diagnosis not present

## 2023-05-25 DIAGNOSIS — D649 Anemia, unspecified: Secondary | ICD-10-CM | POA: Diagnosis not present

## 2023-05-28 ENCOUNTER — Ambulatory Visit (INDEPENDENT_AMBULATORY_CARE_PROVIDER_SITE_OTHER): Payer: Medicare Other

## 2023-05-28 DIAGNOSIS — D649 Anemia, unspecified: Secondary | ICD-10-CM | POA: Diagnosis not present

## 2023-05-28 DIAGNOSIS — Z0000 Encounter for general adult medical examination without abnormal findings: Secondary | ICD-10-CM

## 2023-05-28 DIAGNOSIS — K2100 Gastro-esophageal reflux disease with esophagitis, without bleeding: Secondary | ICD-10-CM | POA: Diagnosis not present

## 2023-05-28 DIAGNOSIS — I129 Hypertensive chronic kidney disease with stage 1 through stage 4 chronic kidney disease, or unspecified chronic kidney disease: Secondary | ICD-10-CM | POA: Diagnosis not present

## 2023-05-28 DIAGNOSIS — N1832 Chronic kidney disease, stage 3b: Secondary | ICD-10-CM | POA: Diagnosis not present

## 2023-05-28 DIAGNOSIS — K2990 Gastroduodenitis, unspecified, without bleeding: Secondary | ICD-10-CM | POA: Diagnosis not present

## 2023-05-28 DIAGNOSIS — I2510 Atherosclerotic heart disease of native coronary artery without angina pectoris: Secondary | ICD-10-CM | POA: Diagnosis not present

## 2023-05-28 NOTE — Progress Notes (Signed)
Subjective:   [name] is a 89 y.o. female who presents for Medicare Annual (Subsequent) preventive examination. I connected with  [name] on 05/28/23 by a audio enabled telemedicine application and verified that I am speaking with the correct person using two identifiers.  Patient Location: Home  Provider Location: Office/Clinic  I discussed the limitations of evaluation and management by telemedicine. The patient expressed understanding and agreed to proceed.   Review of Systems    Defer to PCP  Cardiac Risk Factors include: advanced age (>62men, >23 women);hypertension     Objective:    Today's Vitals   05/28/23 0955  PainSc: 3    There is no height or weight on file to calculate BMI.     05/28/2023   10:14 AM 04/23/2023   10:43 AM 05/02/2022    2:50 PM 12/11/2020   11:28 AM 04/01/2020   11:12 AM 07/23/2019   11:26 AM 09/17/2018    7:40 PM  Advanced Directives  Does Patient Have a Medical Advance Directive? Yes Yes Yes No No No No  Type of Advance Directive Healthcare Power of Attorney  Living will;Healthcare Power of Attorney      Does patient want to make changes to medical advance directive?   No - Patient declined      Copy of Healthcare Power of Attorney in Chart? No - copy requested  Yes - validated most recent copy scanned in chart (See row information)      Would patient like information on creating a medical advance directive?    No - Patient declined Yes (ED - Information included in AVS) No - Patient declined No - Patient declined    Current Medications (verified) Outpatient Encounter Medications as of 05/28/2023  Medication Sig   docusate sodium (COLACE) 100 MG capsule Take 1 capsule (100 mg total) by mouth daily.   famotidine (PEPCID) 20 MG tablet Take 1 tablet (20 mg total) by mouth 3 times/day as needed-between meals & bedtime for heartburn or indigestion.   gabapentin (NEURONTIN) 400 MG capsule Take 1 capsule (400 mg total) by mouth at bedtime.    latanoprost (XALATAN) 0.005 % ophthalmic solution INT 1 GTT IN OD QD   metoprolol succinate (TOPROL-XL) 25 MG 24 hr tablet Take 1 tablet (25 mg total) by mouth daily.   Multiple Vitamin (MULTIVITAMIN) tablet Take 1 tablet by mouth daily.   Multiple Vitamins-Minerals (HAIR SKIN & NAILS ADVANCED) TABS Take 1 mg by mouth daily.   Olopatadine HCl (PATADAY OP) Apply 1 drop to eye daily.   pantoprazole (PROTONIX) 40 MG tablet TAKE 1 TABLET BY MOUTH 1 TO 2 TIMES DAILY   potassium chloride SA (KLOR-CON M) 20 MEQ tablet TAKE 1 TABLET(20 MEQ) BY MOUTH DAILY   rivaroxaban (XARELTO) 10 MG TABS tablet Take 1 tablet (10 mg total) by mouth daily.   traMADol-acetaminophen (ULTRACET) 37.5-325 MG tablet TAKE 1 TABLET BY MOUTH EVERY 6 HOURS AS NEEDED (Patient taking differently: Take 1 tablet by mouth daily.)   verapamil (CALAN-SR) 240 MG CR tablet TAKE 1 TABLET(240 MG) BY MOUTH DAILY   acetaminophen (TYLENOL) 500 MG tablet Take 500 mg by mouth daily as needed for mild pain or moderate pain. (Patient not taking: Reported on 05/28/2023)   FEROSUL 325 (65 Fe) MG tablet TAKE 1 TABLET(325 MG) BY MOUTH DAILY WITH BREAKFAST (Patient not taking: Reported on 05/28/2023)   polyethylene glycol powder (GLYCOLAX/MIRALAX) 17 GM/SCOOP powder Dissolve 17 g as directed and drink daily. (Patient not taking: Reported on  05/28/2023)   No facility-administered encounter medications on file as of 05/28/2023.    Allergies (verified) Patient has no known allergies.   History: Past Medical History:  Diagnosis Date   Abnormal finding on Pap smear, ASCUS    Acute renal insufficiency    Amputation finger    index   Barrett's esophagus    CAD (coronary artery disease)    Dementia (HCC)    Diverticulosis    Elevated BP    Foot deformity    GERD (gastroesophageal reflux disease)    Glaucoma    H/O: GI bleed    HSV-2 (herpes simplex virus 2) infection    Hyperglycemia    Hypertension    Migraine    Osteoporosis    worsening    Spondylosis    Past Surgical History:  Procedure Laterality Date   AMPUTATION FINGER / THUMB     secondary to osteomyelitis   BIOPSY  04/24/2023   Procedure: BIOPSY;  Surgeon: Shellia Cleverly, DO;  Location: [facility] ENDOSCOPY;  Service: Gastroenterology;;   CATARACT EXTRACTION, BILATERAL Bilateral 12/25/2016   COLONOSCOPY     ESOPHAGOGASTRODUODENOSCOPY N/A 10/22/2016   Procedure: ESOPHAGOGASTRODUODENOSCOPY (EGD);  Surgeon: Sherrilyn Rist, MD;  Location: [facility]COPY;  Service: Endoscopy;  Laterality: N/A;   ESOPHAGOGASTRODUODENOSCOPY N/A 04/24/2023   Procedure: ESOPHAGOGASTRODUODENOSCOPY (EGD);  Surgeon: Shellia Cleverly, DO;  Location: [facility] ENDOSCOPY;  Service: Gastroenterology;  Laterality: N/A;   FOOT SURGERY     right   PARTIAL HYSTERECTOMY     UPPER GASTROINTESTINAL ENDOSCOPY     Family History  Problem Relation Age of Onset   Breast cancer Mother    Heart attack Mother    Heart disease Father    Alcohol abuse Son    Colon cancer Neg Hx    Stomach cancer Neg Hx    Rectal cancer Neg Hx    Social History   Socioeconomic History   Marital status: Single    Spouse name: Not on file   Number of children: 1   Years of education: Not on file   Highest education level: 12th grade  Occupational History    Employer: RETIRED  Tobacco Use   Smoking status: Never   Smokeless tobacco: Never  Vaping Use   Vaping Use: Never used  Substance and Sexual Activity   Alcohol use: No    Alcohol/week: 0.0 standard drinks of alcohol   Drug use: No   Sexual activity: Never    Birth control/protection: Post-menopausal  Other Topics Concern   Not on file  Social History Narrative   Marital status: divorced.      Children: 1 son; no grandchildren      Lives: with son      Employment: retired      Tobacco: none      Alcohol: none      Drugs: none       Exercise: none       ADLs:  Independent with ADLs.  Quit driving age 43.  Medical CNA cleans house; makes breakfast; patient pays  bills.      Advanced Directives:   YES.  HCPOA:  Son/Isaac Bowman.  FULL CODE; no prolonged measures.   Social Determinants of Health   Financial Resource Strain: Low Risk  (05/28/2023)   Overall Financial Resource Strain (CARDIA)    Difficulty of Paying Living Expenses: Not hard at all  Food Insecurity: No Food Insecurity (05/28/2023)   Hunger Vital Sign    Worried About Running  Out of Food in the Last Year: Never true    Ran Out of Food in the Last Year: Never true  Transportation Needs: No Transportation Needs (05/28/2023)   PRAPARE - Administrator, Civil Service (Medical): No    Lack of Transportation (Non-Medical): No  Physical Activity: Sufficiently Active (05/28/2023)   Exercise Vital Sign    Days of Exercise per Week: 7 days    Minutes of Exercise per Session: 30 min  Stress: No Stress Concern Present (05/28/2023)   [employer] of Occupational Health - Occupational Stress Questionnaire    Feeling of Stress : Not at all  Social Connections: Moderately Integrated (05/28/2023)   Social Connection and Isolation Panel [NHANES]    Frequency of Communication with Friends and Family: Three times a week    Frequency of Social Gatherings with Friends and Family: More than three times a week    Attends Religious Services: 1 to 4 times per year    Active Member of [employer] or Organizations: Yes    Attends Banker Meetings: 1 to 4 times per year    Marital Status: Never married    Tobacco Counseling Counseling given: Not Answered   Clinical Intake:  Pre-visit preparation completed: Yes  Pain : 0-10 Pain Score: 3  Pain Type: Chronic pain Pain Location: Back Pain Orientation: Mid Pain Descriptors / Indicators: Radiating Pain Onset: Today Pain Frequency: Occasional Pain Relieving Factors: Tramdol  Pain Relieving Factors: Tramdol  Nutritional Status: BMI of 19-24  Normal Nutritional Risks: None Diabetes: No  How often do you need to have someone help  you when you read instructions, pamphlets, or other written materials from your doctor or pharmacy?: 3 - Sometimes What is the last grade level you completed in school?: 12th grade  Diabetic?NO  Interpreter Needed?: No      Activities of Daily Living    05/28/2023   10:16 AM  In your present state of health, do you have any difficulty performing the following activities:  Hearing? 0  Vision? 0  Difficulty concentrating or making decisions? 1  Walking or climbing stairs? 0  Dressing or bathing? 0  Doing errands, shopping? 0  Preparing Food and eating ? N  Using the Toilet? N  In the past six months, have you accidently leaked urine? N  Do you have problems with loss of bowel control? N  Managing your Medications? N  Comment family member help with these task  Managing your Finances? N  Comment family member help with these task  Housekeeping or managing your Housekeeping? N  Comment family member help with these task    Patient Care Team: Etta Grandchild, MD as PCP - General (Internal Medicine) Pricilla Riffle, MD (Cardiology) Cherlyn Roberts, MD (Dermatology) Louis Meckel, MD (Inactive) (Gastroenterology) Enzo Bi, MD (Inactive) (Obstetrics and Gynecology) Ernesto Rutherford, MD (Ophthalmology) Dominica Severin, MD (Orthopedic Surgery)  Indicate any recent Medical Services you may have received from other than [facility] providers in the past year (date may be approximate).     Assessment:   This is a routine wellness examination for [name].  Hearing/Vision screen Vision Screening - Comments:: Annual eye exam, Dr Dione Booze   Dietary issues and exercise activities discussed:     Goals Addressed   None   Depression Screen    05/28/2023   10:14 AM 05/10/2023   10:45 AM 09/20/2022    8:16 AM 05/02/2022    2:52 PM 04/19/2022    1:41  PM 03/09/2021    3:03 PM 12/30/2020   10:16 AM  PHQ 2/9 Scores  PHQ - 2 Score 0 0 0 0 0 0 0    Fall Risk    05/28/2023    9:50 AM  05/10/2023   10:45 AM 09/20/2022    8:16 AM 05/02/2022    2:52 PM 04/19/2022    1:41 PM  Fall Risk   Falls in the past year? 0 0 0 0 0  Number falls in past yr: 0 0 0 0 0  Injury with Fall? 0 0 0 0 0  Risk for fall due to : No Fall Risks No Fall Risks No Fall Risks No Fall Risks No Fall Risks  Risk for fall due to: Comment cane and walker      Follow up Falls evaluation completed Falls evaluation completed Falls evaluation completed Falls evaluation completed Falls evaluation completed    FALL RISK PREVENTION PERTAINING TO THE HOME:  Any stairs in or around the home? Yes  If so, are there any without handrails? No  Home free of loose throw rugs in walkways, pet beds, electrical cords, etc? Yes  Adequate lighting in your home to reduce risk of falls? Yes   ASSISTIVE DEVICES UTILIZED TO PREVENT FALLS:  Life alert? No  Use of a cane, walker or w/c? Yes  Grab bars in the bathroom? Yes  Shower chair or bench in shower? Yes  Elevated toilet seat or a handicapped toilet? Yes   TIMED UP AND GO:  Was the test performed? No .  Length of time to ambulate 10 feet: N/A sec.     Cognitive Function:    05/02/2022    2:52 PM  MMSE - Mini Mental State Exam  Not completed: Unable to complete        05/28/2023   10:18 AM 04/01/2020   11:12 AM  6CIT Screen  What Year? 0 points 0 points  What month? 0 points 0 points  What time? 3 points 0 points  Count back from 20 4 points 0 points  Months in reverse 4 points 0 points  Repeat phrase 10 points 2 points  Total Score 21 points 2 points    Immunizations Immunization History  Administered Date(s) Administered   Fluad Quad(high Dose 65+) 10/05/2020, 09/26/2022   Influenza Split 10/12/2011   Influenza Whole 09/20/2012   Influenza, High Dose Seasonal PF 02/21/2019, 08/26/2019   Influenza,inj,Quad PF,6+ Mos 10/07/2013, 09/03/2014, 08/26/2015, 10/11/2016   Influenza-Unspecified 08/25/2017, 09/30/2021   Moderna Sars-Covid-2 Vaccination  02/06/2020, 03/05/2020   PFIZER(Purple Top)SARS-COV-2 Vaccination 11/12/2020   PPD Test 10/16/2018   Pfizer Covid-19 Vaccine Bivalent Booster 34yrs & up 09/30/2021   Pneumococcal Conjugate-13 07/21/2014   Pneumococcal Polysaccharide-23 12/24/2001, 12/24/2006, 11/04/2016   Tdap 07/15/2007, 04/28/2014   Zoster Recombinat (Shingrix) 01/02/2018, 10/06/2019   Zoster, Live 01/02/2018    TDAP status: Up to date  Flu Vaccine status: Up to date  Pneumococcal vaccine status: Up to date  Covid-19 vaccine status: Information provided on how to obtain vaccines.   Qualifies for Shingles Vaccine? Yes   Zostavax completed Yes   Shingrix Completed?: Yes  Screening Tests Health Maintenance  Topic Date Due   COVID-19 Vaccine (5 - 2023-24 season) 08/25/2022   INFLUENZA VACCINE  07/26/2023   DTaP/Tdap/Td (3 - Td or Tdap) 04/28/2024   Medicare Annual Wellness (AWV)  05/27/2024   Pneumonia Vaccine 1+ Years old  Completed   DEXA SCAN  Completed   Zoster Vaccines- Shingrix  Completed   HPV VACCINES  Aged Out    Health Maintenance  Health Maintenance Due  Topic Date Due   COVID-19 Vaccine (5 - 2023-24 season) 08/25/2022    Colorectal cancer screening: No longer required.   Mammogram status: Completed 07/12/21. Repeat every year  Bone Density status: Completed 11/23/2017. Results reflect: Bone density results: OSTEOPENIA. Repeat every 2 years.  Lung Cancer Screening: (Low Dose CT Chest recommended if Age 25-80 years, 30 pack-year currently smoking OR have quit w/in 15years.) does not qualify.     Additional Screening:  Hepatitis C Screening: does qualify; no has not complete it  Vision Screening: Recommended annual ophthalmology exams for early detection of glaucoma and other disorders of the eye. Is the patient up to date with their annual eye exam?  Yes  Who is the provider or what is the name of the office in which the patient attends annual eye exams? Dr. Dione Booze  If pt is not  established with a provider, would they like to be referred to a provider to establish care? No .   Dental Screening: Recommended annual dental exams for proper oral hygiene  Community Resource Referral / Chronic Care Management: CRR required this visit?  No   CCM required this visit?  No      Plan:     I have personally reviewed and noted the following in the patient's chart:   Medical and social history Use of alcohol, tobacco or illicit drugs  Current medications and supplements including opioid prescriptions. Patient is currently taking opioid prescriptions. Information provided to patient regarding non-opioid alternatives. Patient advised to discuss non-opioid treatment plan with their provider. Functional ability and status Nutritional status Physical activity Advanced directives List of other physicians Hospitalizations, surgeries, and ER visits in previous 12 months Vitals Screenings to include cognitive, depression, and falls Referrals and appointments  In addition, I have reviewed and discussed with patient certain preventive protocols, quality metrics, and best practice recommendations. A written personalized care plan for preventive services as well as general preventive health recommendations were provided to patient.     Young Berry Dhamar Gregory, CMA    05/28/2023  Non Face to face 45 min Nurse Notes:  Ms. Caulkins , Thank you for taking time to come for your Medicare Wellness Visit. I appreciate your ongoing commitment to your health goals. Please review the following plan we discussed and let me know if I can assist you in the future.   These are the goals we discussed:  Goals      Patient declined health goal at this time.     Patient Stated     Continue with current lifestyle        This is a list of the screening recommended for you and due dates:  Health Maintenance  Topic Date Due   COVID-19 Vaccine (5 - 2023-24 season) 08/25/2022   Flu Shot  07/26/2023    DTaP/Tdap/Td vaccine (3 - Td or Tdap) 04/28/2024   Medicare Annual Wellness Visit  05/27/2024   Pneumonia Vaccine  Completed   DEXA scan (bone density measurement)  Completed   Zoster (Shingles) Vaccine  Completed   HPV Vaccine  Aged Out

## 2023-05-28 NOTE — Patient Instructions (Signed)

## 2023-05-30 ENCOUNTER — Other Ambulatory Visit: Payer: Self-pay | Admitting: Internal Medicine

## 2023-05-30 DIAGNOSIS — M5136 Other intervertebral disc degeneration, lumbar region: Secondary | ICD-10-CM

## 2023-05-30 DIAGNOSIS — M479 Spondylosis, unspecified: Secondary | ICD-10-CM

## 2023-05-31 DIAGNOSIS — Z89019 Acquired absence of unspecified thumb: Secondary | ICD-10-CM | POA: Diagnosis not present

## 2023-05-31 DIAGNOSIS — D509 Iron deficiency anemia, unspecified: Secondary | ICD-10-CM | POA: Diagnosis not present

## 2023-05-31 DIAGNOSIS — K2970 Gastritis, unspecified, without bleeding: Secondary | ICD-10-CM | POA: Diagnosis not present

## 2023-05-31 DIAGNOSIS — N1832 Chronic kidney disease, stage 3b: Secondary | ICD-10-CM | POA: Diagnosis not present

## 2023-05-31 DIAGNOSIS — K2270 Barrett's esophagus without dysplasia: Secondary | ICD-10-CM | POA: Diagnosis not present

## 2023-05-31 DIAGNOSIS — K2100 Gastro-esophageal reflux disease with esophagitis, without bleeding: Secondary | ICD-10-CM | POA: Diagnosis not present

## 2023-05-31 DIAGNOSIS — I2510 Atherosclerotic heart disease of native coronary artery without angina pectoris: Secondary | ICD-10-CM | POA: Diagnosis not present

## 2023-05-31 DIAGNOSIS — K449 Diaphragmatic hernia without obstruction or gangrene: Secondary | ICD-10-CM | POA: Diagnosis not present

## 2023-05-31 DIAGNOSIS — K5900 Constipation, unspecified: Secondary | ICD-10-CM | POA: Diagnosis not present

## 2023-05-31 DIAGNOSIS — M2042 Other hammer toe(s) (acquired), left foot: Secondary | ICD-10-CM | POA: Diagnosis not present

## 2023-05-31 DIAGNOSIS — M479 Spondylosis, unspecified: Secondary | ICD-10-CM | POA: Diagnosis not present

## 2023-05-31 DIAGNOSIS — M2041 Other hammer toe(s) (acquired), right foot: Secondary | ICD-10-CM | POA: Diagnosis not present

## 2023-05-31 DIAGNOSIS — K2990 Gastroduodenitis, unspecified, without bleeding: Secondary | ICD-10-CM | POA: Diagnosis not present

## 2023-05-31 DIAGNOSIS — M810 Age-related osteoporosis without current pathological fracture: Secondary | ICD-10-CM | POA: Diagnosis not present

## 2023-05-31 DIAGNOSIS — I129 Hypertensive chronic kidney disease with stage 1 through stage 4 chronic kidney disease, or unspecified chronic kidney disease: Secondary | ICD-10-CM | POA: Diagnosis not present

## 2023-05-31 DIAGNOSIS — G43909 Migraine, unspecified, not intractable, without status migrainosus: Secondary | ICD-10-CM | POA: Diagnosis not present

## 2023-05-31 DIAGNOSIS — F0390 Unspecified dementia without behavioral disturbance: Secondary | ICD-10-CM | POA: Diagnosis not present

## 2023-05-31 DIAGNOSIS — Z7982 Long term (current) use of aspirin: Secondary | ICD-10-CM | POA: Diagnosis not present

## 2023-05-31 DIAGNOSIS — D649 Anemia, unspecified: Secondary | ICD-10-CM | POA: Diagnosis not present

## 2023-05-31 DIAGNOSIS — H409 Unspecified glaucoma: Secondary | ICD-10-CM | POA: Diagnosis not present

## 2023-05-31 DIAGNOSIS — I82511 Chronic embolism and thrombosis of right femoral vein: Secondary | ICD-10-CM | POA: Diagnosis not present

## 2023-06-06 DIAGNOSIS — K2990 Gastroduodenitis, unspecified, without bleeding: Secondary | ICD-10-CM | POA: Diagnosis not present

## 2023-06-06 DIAGNOSIS — N1832 Chronic kidney disease, stage 3b: Secondary | ICD-10-CM | POA: Diagnosis not present

## 2023-06-06 DIAGNOSIS — I2510 Atherosclerotic heart disease of native coronary artery without angina pectoris: Secondary | ICD-10-CM | POA: Diagnosis not present

## 2023-06-06 DIAGNOSIS — K2100 Gastro-esophageal reflux disease with esophagitis, without bleeding: Secondary | ICD-10-CM | POA: Diagnosis not present

## 2023-06-06 DIAGNOSIS — I129 Hypertensive chronic kidney disease with stage 1 through stage 4 chronic kidney disease, or unspecified chronic kidney disease: Secondary | ICD-10-CM | POA: Diagnosis not present

## 2023-06-06 DIAGNOSIS — D649 Anemia, unspecified: Secondary | ICD-10-CM | POA: Diagnosis not present

## 2023-06-07 DIAGNOSIS — K2100 Gastro-esophageal reflux disease with esophagitis, without bleeding: Secondary | ICD-10-CM | POA: Diagnosis not present

## 2023-06-07 DIAGNOSIS — N1832 Chronic kidney disease, stage 3b: Secondary | ICD-10-CM | POA: Diagnosis not present

## 2023-06-07 DIAGNOSIS — I2510 Atherosclerotic heart disease of native coronary artery without angina pectoris: Secondary | ICD-10-CM | POA: Diagnosis not present

## 2023-06-07 DIAGNOSIS — I129 Hypertensive chronic kidney disease with stage 1 through stage 4 chronic kidney disease, or unspecified chronic kidney disease: Secondary | ICD-10-CM | POA: Diagnosis not present

## 2023-06-07 DIAGNOSIS — D649 Anemia, unspecified: Secondary | ICD-10-CM | POA: Diagnosis not present

## 2023-06-07 DIAGNOSIS — K2990 Gastroduodenitis, unspecified, without bleeding: Secondary | ICD-10-CM | POA: Diagnosis not present

## 2023-06-12 ENCOUNTER — Other Ambulatory Visit (HOSPITAL_COMMUNITY): Payer: Self-pay

## 2023-06-12 ENCOUNTER — Telehealth: Payer: Self-pay | Admitting: Internal Medicine

## 2023-06-12 DIAGNOSIS — I2510 Atherosclerotic heart disease of native coronary artery without angina pectoris: Secondary | ICD-10-CM | POA: Diagnosis not present

## 2023-06-12 DIAGNOSIS — N1832 Chronic kidney disease, stage 3b: Secondary | ICD-10-CM | POA: Diagnosis not present

## 2023-06-12 DIAGNOSIS — D649 Anemia, unspecified: Secondary | ICD-10-CM | POA: Diagnosis not present

## 2023-06-12 DIAGNOSIS — I129 Hypertensive chronic kidney disease with stage 1 through stage 4 chronic kidney disease, or unspecified chronic kidney disease: Secondary | ICD-10-CM | POA: Diagnosis not present

## 2023-06-12 DIAGNOSIS — K2990 Gastroduodenitis, unspecified, without bleeding: Secondary | ICD-10-CM | POA: Diagnosis not present

## 2023-06-12 DIAGNOSIS — K2100 Gastro-esophageal reflux disease with esophagitis, without bleeding: Secondary | ICD-10-CM | POA: Diagnosis not present

## 2023-06-12 NOTE — Telephone Encounter (Signed)
[name] called from [facility] called wanting Dr. Yetta Barre to know that the pt has a high BP reading 158/80.  Best call back number is [phone]

## 2023-06-13 DIAGNOSIS — H903 Sensorineural hearing loss, bilateral: Secondary | ICD-10-CM | POA: Diagnosis not present

## 2023-06-13 DIAGNOSIS — H838X3 Other specified diseases of inner ear, bilateral: Secondary | ICD-10-CM | POA: Diagnosis not present

## 2023-06-13 DIAGNOSIS — H6123 Impacted cerumen, bilateral: Secondary | ICD-10-CM | POA: Diagnosis not present

## 2023-06-18 DIAGNOSIS — K2100 Gastro-esophageal reflux disease with esophagitis, without bleeding: Secondary | ICD-10-CM | POA: Diagnosis not present

## 2023-06-18 DIAGNOSIS — N1832 Chronic kidney disease, stage 3b: Secondary | ICD-10-CM | POA: Diagnosis not present

## 2023-06-18 DIAGNOSIS — D649 Anemia, unspecified: Secondary | ICD-10-CM | POA: Diagnosis not present

## 2023-06-18 DIAGNOSIS — K2990 Gastroduodenitis, unspecified, without bleeding: Secondary | ICD-10-CM | POA: Diagnosis not present

## 2023-06-18 DIAGNOSIS — I129 Hypertensive chronic kidney disease with stage 1 through stage 4 chronic kidney disease, or unspecified chronic kidney disease: Secondary | ICD-10-CM | POA: Diagnosis not present

## 2023-06-18 DIAGNOSIS — I2510 Atherosclerotic heart disease of native coronary artery without angina pectoris: Secondary | ICD-10-CM | POA: Diagnosis not present

## 2023-06-25 DIAGNOSIS — K2100 Gastro-esophageal reflux disease with esophagitis, without bleeding: Secondary | ICD-10-CM | POA: Diagnosis not present

## 2023-06-25 DIAGNOSIS — I129 Hypertensive chronic kidney disease with stage 1 through stage 4 chronic kidney disease, or unspecified chronic kidney disease: Secondary | ICD-10-CM | POA: Diagnosis not present

## 2023-06-25 DIAGNOSIS — N1832 Chronic kidney disease, stage 3b: Secondary | ICD-10-CM | POA: Diagnosis not present

## 2023-06-25 DIAGNOSIS — I2510 Atherosclerotic heart disease of native coronary artery without angina pectoris: Secondary | ICD-10-CM | POA: Diagnosis not present

## 2023-06-25 DIAGNOSIS — D649 Anemia, unspecified: Secondary | ICD-10-CM | POA: Diagnosis not present

## 2023-06-25 DIAGNOSIS — K2990 Gastroduodenitis, unspecified, without bleeding: Secondary | ICD-10-CM | POA: Diagnosis not present

## 2023-06-26 ENCOUNTER — Telehealth: Payer: Self-pay | Admitting: Internal Medicine

## 2023-06-26 DIAGNOSIS — K2100 Gastro-esophageal reflux disease with esophagitis, without bleeding: Secondary | ICD-10-CM | POA: Diagnosis not present

## 2023-06-26 DIAGNOSIS — N1832 Chronic kidney disease, stage 3b: Secondary | ICD-10-CM | POA: Diagnosis not present

## 2023-06-26 DIAGNOSIS — K2990 Gastroduodenitis, unspecified, without bleeding: Secondary | ICD-10-CM | POA: Diagnosis not present

## 2023-06-26 DIAGNOSIS — I129 Hypertensive chronic kidney disease with stage 1 through stage 4 chronic kidney disease, or unspecified chronic kidney disease: Secondary | ICD-10-CM | POA: Diagnosis not present

## 2023-06-26 DIAGNOSIS — I2510 Atherosclerotic heart disease of native coronary artery without angina pectoris: Secondary | ICD-10-CM | POA: Diagnosis not present

## 2023-06-26 DIAGNOSIS — D649 Anemia, unspecified: Secondary | ICD-10-CM | POA: Diagnosis not present

## 2023-06-26 NOTE — Telephone Encounter (Signed)
Verbal orders given to [facility] per VM

## 2023-06-26 NOTE — Telephone Encounter (Signed)
Caller [employer]: [name] from [facility]   Phone Number: [phone]   Needs Verbal orders for what service & frequency:  Home Health Physical Therapy 2 x a week for 4 weeks and then 1 time x a week for 4 weeks

## 2023-06-29 DIAGNOSIS — I2510 Atherosclerotic heart disease of native coronary artery without angina pectoris: Secondary | ICD-10-CM | POA: Diagnosis not present

## 2023-06-29 DIAGNOSIS — N1832 Chronic kidney disease, stage 3b: Secondary | ICD-10-CM | POA: Diagnosis not present

## 2023-06-29 DIAGNOSIS — I129 Hypertensive chronic kidney disease with stage 1 through stage 4 chronic kidney disease, or unspecified chronic kidney disease: Secondary | ICD-10-CM | POA: Diagnosis not present

## 2023-06-29 DIAGNOSIS — Z89019 Acquired absence of unspecified thumb: Secondary | ICD-10-CM

## 2023-06-29 DIAGNOSIS — M2042 Other hammer toe(s) (acquired), left foot: Secondary | ICD-10-CM

## 2023-06-29 DIAGNOSIS — G43909 Migraine, unspecified, not intractable, without status migrainosus: Secondary | ICD-10-CM

## 2023-06-29 DIAGNOSIS — K5900 Constipation, unspecified: Secondary | ICD-10-CM

## 2023-06-29 DIAGNOSIS — D649 Anemia, unspecified: Secondary | ICD-10-CM | POA: Diagnosis not present

## 2023-06-29 DIAGNOSIS — I82511 Chronic embolism and thrombosis of right femoral vein: Secondary | ICD-10-CM | POA: Diagnosis not present

## 2023-06-29 DIAGNOSIS — K2970 Gastritis, unspecified, without bleeding: Secondary | ICD-10-CM

## 2023-06-29 DIAGNOSIS — K2100 Gastro-esophageal reflux disease with esophagitis, without bleeding: Secondary | ICD-10-CM | POA: Diagnosis not present

## 2023-06-29 DIAGNOSIS — H409 Unspecified glaucoma: Secondary | ICD-10-CM | POA: Diagnosis not present

## 2023-06-29 DIAGNOSIS — D509 Iron deficiency anemia, unspecified: Secondary | ICD-10-CM | POA: Diagnosis not present

## 2023-06-29 DIAGNOSIS — M2041 Other hammer toe(s) (acquired), right foot: Secondary | ICD-10-CM

## 2023-06-29 DIAGNOSIS — M479 Spondylosis, unspecified: Secondary | ICD-10-CM

## 2023-06-29 DIAGNOSIS — F0390 Unspecified dementia without behavioral disturbance: Secondary | ICD-10-CM | POA: Diagnosis not present

## 2023-06-29 DIAGNOSIS — K2990 Gastroduodenitis, unspecified, without bleeding: Secondary | ICD-10-CM | POA: Diagnosis not present

## 2023-06-29 DIAGNOSIS — K449 Diaphragmatic hernia without obstruction or gangrene: Secondary | ICD-10-CM | POA: Diagnosis not present

## 2023-06-29 DIAGNOSIS — K2270 Barrett's esophagus without dysplasia: Secondary | ICD-10-CM | POA: Diagnosis not present

## 2023-06-29 DIAGNOSIS — M810 Age-related osteoporosis without current pathological fracture: Secondary | ICD-10-CM

## 2023-06-29 DIAGNOSIS — Z7982 Long term (current) use of aspirin: Secondary | ICD-10-CM

## 2023-06-30 DIAGNOSIS — K2970 Gastritis, unspecified, without bleeding: Secondary | ICD-10-CM | POA: Diagnosis not present

## 2023-06-30 DIAGNOSIS — K449 Diaphragmatic hernia without obstruction or gangrene: Secondary | ICD-10-CM | POA: Diagnosis not present

## 2023-06-30 DIAGNOSIS — N1832 Chronic kidney disease, stage 3b: Secondary | ICD-10-CM | POA: Diagnosis not present

## 2023-06-30 DIAGNOSIS — K2270 Barrett's esophagus without dysplasia: Secondary | ICD-10-CM | POA: Diagnosis not present

## 2023-06-30 DIAGNOSIS — D509 Iron deficiency anemia, unspecified: Secondary | ICD-10-CM | POA: Diagnosis not present

## 2023-06-30 DIAGNOSIS — M810 Age-related osteoporosis without current pathological fracture: Secondary | ICD-10-CM | POA: Diagnosis not present

## 2023-06-30 DIAGNOSIS — I129 Hypertensive chronic kidney disease with stage 1 through stage 4 chronic kidney disease, or unspecified chronic kidney disease: Secondary | ICD-10-CM | POA: Diagnosis not present

## 2023-06-30 DIAGNOSIS — I82511 Chronic embolism and thrombosis of right femoral vein: Secondary | ICD-10-CM | POA: Diagnosis not present

## 2023-06-30 DIAGNOSIS — K2100 Gastro-esophageal reflux disease with esophagitis, without bleeding: Secondary | ICD-10-CM | POA: Diagnosis not present

## 2023-06-30 DIAGNOSIS — K2990 Gastroduodenitis, unspecified, without bleeding: Secondary | ICD-10-CM | POA: Diagnosis not present

## 2023-06-30 DIAGNOSIS — G43909 Migraine, unspecified, not intractable, without status migrainosus: Secondary | ICD-10-CM | POA: Diagnosis not present

## 2023-06-30 DIAGNOSIS — D649 Anemia, unspecified: Secondary | ICD-10-CM | POA: Diagnosis not present

## 2023-06-30 DIAGNOSIS — M479 Spondylosis, unspecified: Secondary | ICD-10-CM | POA: Diagnosis not present

## 2023-06-30 DIAGNOSIS — Z89019 Acquired absence of unspecified thumb: Secondary | ICD-10-CM | POA: Diagnosis not present

## 2023-06-30 DIAGNOSIS — M2042 Other hammer toe(s) (acquired), left foot: Secondary | ICD-10-CM | POA: Diagnosis not present

## 2023-06-30 DIAGNOSIS — Z7982 Long term (current) use of aspirin: Secondary | ICD-10-CM | POA: Diagnosis not present

## 2023-06-30 DIAGNOSIS — I2510 Atherosclerotic heart disease of native coronary artery without angina pectoris: Secondary | ICD-10-CM | POA: Diagnosis not present

## 2023-06-30 DIAGNOSIS — M2041 Other hammer toe(s) (acquired), right foot: Secondary | ICD-10-CM | POA: Diagnosis not present

## 2023-06-30 DIAGNOSIS — F0390 Unspecified dementia without behavioral disturbance: Secondary | ICD-10-CM | POA: Diagnosis not present

## 2023-06-30 DIAGNOSIS — K5900 Constipation, unspecified: Secondary | ICD-10-CM | POA: Diagnosis not present

## 2023-06-30 DIAGNOSIS — H409 Unspecified glaucoma: Secondary | ICD-10-CM | POA: Diagnosis not present

## 2023-07-02 DIAGNOSIS — I2510 Atherosclerotic heart disease of native coronary artery without angina pectoris: Secondary | ICD-10-CM | POA: Diagnosis not present

## 2023-07-02 DIAGNOSIS — K2990 Gastroduodenitis, unspecified, without bleeding: Secondary | ICD-10-CM | POA: Diagnosis not present

## 2023-07-02 DIAGNOSIS — K2100 Gastro-esophageal reflux disease with esophagitis, without bleeding: Secondary | ICD-10-CM | POA: Diagnosis not present

## 2023-07-02 DIAGNOSIS — D649 Anemia, unspecified: Secondary | ICD-10-CM | POA: Diagnosis not present

## 2023-07-02 DIAGNOSIS — N1832 Chronic kidney disease, stage 3b: Secondary | ICD-10-CM | POA: Diagnosis not present

## 2023-07-02 DIAGNOSIS — I129 Hypertensive chronic kidney disease with stage 1 through stage 4 chronic kidney disease, or unspecified chronic kidney disease: Secondary | ICD-10-CM | POA: Diagnosis not present

## 2023-07-06 DIAGNOSIS — I129 Hypertensive chronic kidney disease with stage 1 through stage 4 chronic kidney disease, or unspecified chronic kidney disease: Secondary | ICD-10-CM | POA: Diagnosis not present

## 2023-07-06 DIAGNOSIS — D649 Anemia, unspecified: Secondary | ICD-10-CM | POA: Diagnosis not present

## 2023-07-06 DIAGNOSIS — N1832 Chronic kidney disease, stage 3b: Secondary | ICD-10-CM | POA: Diagnosis not present

## 2023-07-06 DIAGNOSIS — K2990 Gastroduodenitis, unspecified, without bleeding: Secondary | ICD-10-CM | POA: Diagnosis not present

## 2023-07-06 DIAGNOSIS — K2100 Gastro-esophageal reflux disease with esophagitis, without bleeding: Secondary | ICD-10-CM | POA: Diagnosis not present

## 2023-07-06 DIAGNOSIS — I2510 Atherosclerotic heart disease of native coronary artery without angina pectoris: Secondary | ICD-10-CM | POA: Diagnosis not present

## 2023-07-10 DIAGNOSIS — K2990 Gastroduodenitis, unspecified, without bleeding: Secondary | ICD-10-CM | POA: Diagnosis not present

## 2023-07-10 DIAGNOSIS — D649 Anemia, unspecified: Secondary | ICD-10-CM | POA: Diagnosis not present

## 2023-07-10 DIAGNOSIS — K2100 Gastro-esophageal reflux disease with esophagitis, without bleeding: Secondary | ICD-10-CM | POA: Diagnosis not present

## 2023-07-10 DIAGNOSIS — N1832 Chronic kidney disease, stage 3b: Secondary | ICD-10-CM | POA: Diagnosis not present

## 2023-07-10 DIAGNOSIS — I129 Hypertensive chronic kidney disease with stage 1 through stage 4 chronic kidney disease, or unspecified chronic kidney disease: Secondary | ICD-10-CM | POA: Diagnosis not present

## 2023-07-10 DIAGNOSIS — I2510 Atherosclerotic heart disease of native coronary artery without angina pectoris: Secondary | ICD-10-CM | POA: Diagnosis not present

## 2023-07-12 DIAGNOSIS — I129 Hypertensive chronic kidney disease with stage 1 through stage 4 chronic kidney disease, or unspecified chronic kidney disease: Secondary | ICD-10-CM | POA: Diagnosis not present

## 2023-07-12 DIAGNOSIS — N1832 Chronic kidney disease, stage 3b: Secondary | ICD-10-CM | POA: Diagnosis not present

## 2023-07-12 DIAGNOSIS — D649 Anemia, unspecified: Secondary | ICD-10-CM | POA: Diagnosis not present

## 2023-07-12 DIAGNOSIS — K2990 Gastroduodenitis, unspecified, without bleeding: Secondary | ICD-10-CM | POA: Diagnosis not present

## 2023-07-12 DIAGNOSIS — K2100 Gastro-esophageal reflux disease with esophagitis, without bleeding: Secondary | ICD-10-CM | POA: Diagnosis not present

## 2023-07-12 DIAGNOSIS — I2510 Atherosclerotic heart disease of native coronary artery without angina pectoris: Secondary | ICD-10-CM | POA: Diagnosis not present

## 2023-07-17 DIAGNOSIS — K2990 Gastroduodenitis, unspecified, without bleeding: Secondary | ICD-10-CM | POA: Diagnosis not present

## 2023-07-17 DIAGNOSIS — N1832 Chronic kidney disease, stage 3b: Secondary | ICD-10-CM | POA: Diagnosis not present

## 2023-07-17 DIAGNOSIS — K2100 Gastro-esophageal reflux disease with esophagitis, without bleeding: Secondary | ICD-10-CM | POA: Diagnosis not present

## 2023-07-17 DIAGNOSIS — I129 Hypertensive chronic kidney disease with stage 1 through stage 4 chronic kidney disease, or unspecified chronic kidney disease: Secondary | ICD-10-CM | POA: Diagnosis not present

## 2023-07-17 DIAGNOSIS — I2510 Atherosclerotic heart disease of native coronary artery without angina pectoris: Secondary | ICD-10-CM | POA: Diagnosis not present

## 2023-07-17 DIAGNOSIS — D649 Anemia, unspecified: Secondary | ICD-10-CM | POA: Diagnosis not present

## 2023-07-19 DIAGNOSIS — K2100 Gastro-esophageal reflux disease with esophagitis, without bleeding: Secondary | ICD-10-CM | POA: Diagnosis not present

## 2023-07-19 DIAGNOSIS — I2510 Atherosclerotic heart disease of native coronary artery without angina pectoris: Secondary | ICD-10-CM | POA: Diagnosis not present

## 2023-07-19 DIAGNOSIS — N1832 Chronic kidney disease, stage 3b: Secondary | ICD-10-CM | POA: Diagnosis not present

## 2023-07-19 DIAGNOSIS — K2990 Gastroduodenitis, unspecified, without bleeding: Secondary | ICD-10-CM | POA: Diagnosis not present

## 2023-07-19 DIAGNOSIS — D649 Anemia, unspecified: Secondary | ICD-10-CM | POA: Diagnosis not present

## 2023-07-19 DIAGNOSIS — I129 Hypertensive chronic kidney disease with stage 1 through stage 4 chronic kidney disease, or unspecified chronic kidney disease: Secondary | ICD-10-CM | POA: Diagnosis not present

## 2023-07-20 ENCOUNTER — Other Ambulatory Visit: Payer: Self-pay | Admitting: Internal Medicine

## 2023-07-20 DIAGNOSIS — I2510 Atherosclerotic heart disease of native coronary artery without angina pectoris: Secondary | ICD-10-CM

## 2023-07-20 DIAGNOSIS — I10 Essential (primary) hypertension: Secondary | ICD-10-CM

## 2023-07-20 MED ORDER — VERAPAMIL HCL ER 240 MG PO TBCR: 240.0000 mg | EXTENDED_RELEASE_TABLET | Freq: Every day | ORAL | 1 refills | Status: DC

## 2023-07-23 DIAGNOSIS — K2100 Gastro-esophageal reflux disease with esophagitis, without bleeding: Secondary | ICD-10-CM | POA: Diagnosis not present

## 2023-07-23 DIAGNOSIS — I2510 Atherosclerotic heart disease of native coronary artery without angina pectoris: Secondary | ICD-10-CM | POA: Diagnosis not present

## 2023-07-23 DIAGNOSIS — D649 Anemia, unspecified: Secondary | ICD-10-CM | POA: Diagnosis not present

## 2023-07-23 DIAGNOSIS — N1832 Chronic kidney disease, stage 3b: Secondary | ICD-10-CM | POA: Diagnosis not present

## 2023-07-23 DIAGNOSIS — K2990 Gastroduodenitis, unspecified, without bleeding: Secondary | ICD-10-CM | POA: Diagnosis not present

## 2023-07-23 DIAGNOSIS — I129 Hypertensive chronic kidney disease with stage 1 through stage 4 chronic kidney disease, or unspecified chronic kidney disease: Secondary | ICD-10-CM | POA: Diagnosis not present

## 2023-07-26 DIAGNOSIS — D649 Anemia, unspecified: Secondary | ICD-10-CM | POA: Diagnosis not present

## 2023-07-26 DIAGNOSIS — N1832 Chronic kidney disease, stage 3b: Secondary | ICD-10-CM | POA: Diagnosis not present

## 2023-07-26 DIAGNOSIS — K2100 Gastro-esophageal reflux disease with esophagitis, without bleeding: Secondary | ICD-10-CM | POA: Diagnosis not present

## 2023-07-26 DIAGNOSIS — K2990 Gastroduodenitis, unspecified, without bleeding: Secondary | ICD-10-CM | POA: Diagnosis not present

## 2023-07-26 DIAGNOSIS — I2510 Atherosclerotic heart disease of native coronary artery without angina pectoris: Secondary | ICD-10-CM | POA: Diagnosis not present

## 2023-07-26 DIAGNOSIS — I129 Hypertensive chronic kidney disease with stage 1 through stage 4 chronic kidney disease, or unspecified chronic kidney disease: Secondary | ICD-10-CM | POA: Diagnosis not present

## 2023-07-30 DIAGNOSIS — I2510 Atherosclerotic heart disease of native coronary artery without angina pectoris: Secondary | ICD-10-CM | POA: Diagnosis not present

## 2023-07-30 DIAGNOSIS — N1832 Chronic kidney disease, stage 3b: Secondary | ICD-10-CM | POA: Diagnosis not present

## 2023-07-30 DIAGNOSIS — H409 Unspecified glaucoma: Secondary | ICD-10-CM | POA: Diagnosis not present

## 2023-07-30 DIAGNOSIS — K449 Diaphragmatic hernia without obstruction or gangrene: Secondary | ICD-10-CM | POA: Diagnosis not present

## 2023-07-30 DIAGNOSIS — M479 Spondylosis, unspecified: Secondary | ICD-10-CM | POA: Diagnosis not present

## 2023-07-30 DIAGNOSIS — I129 Hypertensive chronic kidney disease with stage 1 through stage 4 chronic kidney disease, or unspecified chronic kidney disease: Secondary | ICD-10-CM | POA: Diagnosis not present

## 2023-07-30 DIAGNOSIS — Z7982 Long term (current) use of aspirin: Secondary | ICD-10-CM | POA: Diagnosis not present

## 2023-07-30 DIAGNOSIS — K2100 Gastro-esophageal reflux disease with esophagitis, without bleeding: Secondary | ICD-10-CM | POA: Diagnosis not present

## 2023-07-30 DIAGNOSIS — D649 Anemia, unspecified: Secondary | ICD-10-CM | POA: Diagnosis not present

## 2023-07-30 DIAGNOSIS — M2041 Other hammer toe(s) (acquired), right foot: Secondary | ICD-10-CM | POA: Diagnosis not present

## 2023-07-30 DIAGNOSIS — M2042 Other hammer toe(s) (acquired), left foot: Secondary | ICD-10-CM | POA: Diagnosis not present

## 2023-07-30 DIAGNOSIS — M810 Age-related osteoporosis without current pathological fracture: Secondary | ICD-10-CM | POA: Diagnosis not present

## 2023-07-30 DIAGNOSIS — G43909 Migraine, unspecified, not intractable, without status migrainosus: Secondary | ICD-10-CM | POA: Diagnosis not present

## 2023-07-30 DIAGNOSIS — K5900 Constipation, unspecified: Secondary | ICD-10-CM | POA: Diagnosis not present

## 2023-07-30 DIAGNOSIS — I82511 Chronic embolism and thrombosis of right femoral vein: Secondary | ICD-10-CM | POA: Diagnosis not present

## 2023-07-30 DIAGNOSIS — D509 Iron deficiency anemia, unspecified: Secondary | ICD-10-CM | POA: Diagnosis not present

## 2023-07-30 DIAGNOSIS — K2270 Barrett's esophagus without dysplasia: Secondary | ICD-10-CM | POA: Diagnosis not present

## 2023-07-30 DIAGNOSIS — K2990 Gastroduodenitis, unspecified, without bleeding: Secondary | ICD-10-CM | POA: Diagnosis not present

## 2023-07-30 DIAGNOSIS — Z89019 Acquired absence of unspecified thumb: Secondary | ICD-10-CM | POA: Diagnosis not present

## 2023-07-30 DIAGNOSIS — K2970 Gastritis, unspecified, without bleeding: Secondary | ICD-10-CM | POA: Diagnosis not present

## 2023-07-30 DIAGNOSIS — F0390 Unspecified dementia without behavioral disturbance: Secondary | ICD-10-CM | POA: Diagnosis not present

## 2023-08-01 DIAGNOSIS — K2990 Gastroduodenitis, unspecified, without bleeding: Secondary | ICD-10-CM | POA: Diagnosis not present

## 2023-08-01 DIAGNOSIS — K2100 Gastro-esophageal reflux disease with esophagitis, without bleeding: Secondary | ICD-10-CM | POA: Diagnosis not present

## 2023-08-01 DIAGNOSIS — N1832 Chronic kidney disease, stage 3b: Secondary | ICD-10-CM | POA: Diagnosis not present

## 2023-08-01 DIAGNOSIS — I2510 Atherosclerotic heart disease of native coronary artery without angina pectoris: Secondary | ICD-10-CM | POA: Diagnosis not present

## 2023-08-01 DIAGNOSIS — D649 Anemia, unspecified: Secondary | ICD-10-CM | POA: Diagnosis not present

## 2023-08-01 DIAGNOSIS — I129 Hypertensive chronic kidney disease with stage 1 through stage 4 chronic kidney disease, or unspecified chronic kidney disease: Secondary | ICD-10-CM | POA: Diagnosis not present

## 2023-08-06 DIAGNOSIS — Z1231 Encounter for screening mammogram for malignant neoplasm of breast: Secondary | ICD-10-CM | POA: Diagnosis not present

## 2023-08-06 LAB — HM MAMMOGRAPHY

## 2023-08-07 ENCOUNTER — Other Ambulatory Visit: Payer: Self-pay | Admitting: Internal Medicine

## 2023-08-07 ENCOUNTER — Encounter: Payer: Self-pay | Admitting: Internal Medicine

## 2023-08-07 DIAGNOSIS — D6869 Other thrombophilia: Secondary | ICD-10-CM

## 2023-08-07 DIAGNOSIS — I82511 Chronic embolism and thrombosis of right femoral vein: Secondary | ICD-10-CM

## 2023-08-08 ENCOUNTER — Ambulatory Visit: Payer: Medicare Other | Admitting: Podiatry

## 2023-08-08 ENCOUNTER — Encounter: Payer: Self-pay | Admitting: Podiatry

## 2023-08-08 DIAGNOSIS — D689 Coagulation defect, unspecified: Secondary | ICD-10-CM

## 2023-08-08 DIAGNOSIS — M79676 Pain in unspecified toe(s): Secondary | ICD-10-CM | POA: Diagnosis not present

## 2023-08-08 DIAGNOSIS — L84 Corns and callosities: Secondary | ICD-10-CM | POA: Diagnosis not present

## 2023-08-08 DIAGNOSIS — B351 Tinea unguium: Secondary | ICD-10-CM

## 2023-08-09 DIAGNOSIS — K2990 Gastroduodenitis, unspecified, without bleeding: Secondary | ICD-10-CM | POA: Diagnosis not present

## 2023-08-09 DIAGNOSIS — K2100 Gastro-esophageal reflux disease with esophagitis, without bleeding: Secondary | ICD-10-CM | POA: Diagnosis not present

## 2023-08-09 DIAGNOSIS — I129 Hypertensive chronic kidney disease with stage 1 through stage 4 chronic kidney disease, or unspecified chronic kidney disease: Secondary | ICD-10-CM | POA: Diagnosis not present

## 2023-08-09 DIAGNOSIS — D649 Anemia, unspecified: Secondary | ICD-10-CM | POA: Diagnosis not present

## 2023-08-09 DIAGNOSIS — I2510 Atherosclerotic heart disease of native coronary artery without angina pectoris: Secondary | ICD-10-CM | POA: Diagnosis not present

## 2023-08-09 DIAGNOSIS — N1832 Chronic kidney disease, stage 3b: Secondary | ICD-10-CM | POA: Diagnosis not present

## 2023-08-13 DIAGNOSIS — I129 Hypertensive chronic kidney disease with stage 1 through stage 4 chronic kidney disease, or unspecified chronic kidney disease: Secondary | ICD-10-CM | POA: Diagnosis not present

## 2023-08-13 DIAGNOSIS — D649 Anemia, unspecified: Secondary | ICD-10-CM | POA: Diagnosis not present

## 2023-08-13 DIAGNOSIS — I2510 Atherosclerotic heart disease of native coronary artery without angina pectoris: Secondary | ICD-10-CM | POA: Diagnosis not present

## 2023-08-13 DIAGNOSIS — K2100 Gastro-esophageal reflux disease with esophagitis, without bleeding: Secondary | ICD-10-CM | POA: Diagnosis not present

## 2023-08-13 DIAGNOSIS — N1832 Chronic kidney disease, stage 3b: Secondary | ICD-10-CM | POA: Diagnosis not present

## 2023-08-13 DIAGNOSIS — K2990 Gastroduodenitis, unspecified, without bleeding: Secondary | ICD-10-CM | POA: Diagnosis not present

## 2023-08-13 NOTE — Progress Notes (Signed)
  Subjective:  Patient ID: [name], female    DOB: 02-Aug-1933,  MRN: [id number]  Sharian Trotter presents to clinic today for at risk foot care with h/o clotting disorder and callus(es) of both feet and painful thick toenails that are difficult to trim. Painful toenails interfere with ambulation. Aggravating factors include wearing enclosed shoe gear. Pain is relieved with periodic professional debridement. Painful calluses are aggravated when weightbearing with and without shoegear. Pain is relieved with periodic professional debridement.   New problem(s): None.   PCP is Etta Grandchild, MD.  No Known Allergies  Review of Systems: Negative except as noted in the HPI.  Objective: No changes noted in today's physical examination. There were no vitals filed for this visit. [name] is a pleasant 89 y.o. female frail, in NAD. AAO x 3.  Vascular Examination: CFT <3 seconds b/l. DP/PT pulses faintly palpable b/l. Skin temperature gradient warm to warm b/l. No pain with calf compression. No ischemia or gangrene. No cyanosis or clubbing noted b/l. Trace edema noted BLE.   Neurological Examination: Sensation grossly intact b/l with 10 gram monofilament. Vibratory sensation intact b/l.   Dermatological Examination: Pedal skin thin, shiny and atrophic b/l LE. Toenails 1-5 b/l elongated, discolored, dystrophic, thickened, crumbly with subungual debris and tenderness to dorsal palpation.   Hyperkeratotic lesion(s) plantar heel pad of right foot, R 2nd toe, submet head 5 right foot, and sub 5th met base right lower extremity.  No erythema, no edema, no drainage, no fluctuance.  Musculoskeletal Examination: Muscle strength 5/5 to b/l LE. HAV with bunion bilaterally and hammertoes 2-5 b/l. Plantar fat pad atrophy b/l lower extremities. Utilizes wheelchair for mobility assistance.  Radiographs: None Assessment/Plan: 1. Pain due to onychomycosis of toenail   2. Corns and callosities   3. Clotting  disorder (HCC)    -Consent given for treatment as described below: -Examined patient. -Patient to continue soft, supportive shoe gear daily. -Mycotic toenails 1-5 bilaterally were debrided in length and girth with sterile nail nippers and dremel without incident. -Corn(s) R 2nd toe and callus(es) right heel, submet head 5 right foot, and sub 5th met base right foot were pared utilizing sterile scalpel blade without incident. Total number debrided =4. -Patient/POA to call should there be question/concern in the interim.   Return in about 3 months (around 11/08/2023).  Freddie Breech, DPM

## 2023-08-21 DIAGNOSIS — N1832 Chronic kidney disease, stage 3b: Secondary | ICD-10-CM | POA: Diagnosis not present

## 2023-08-21 DIAGNOSIS — D649 Anemia, unspecified: Secondary | ICD-10-CM | POA: Diagnosis not present

## 2023-08-21 DIAGNOSIS — I129 Hypertensive chronic kidney disease with stage 1 through stage 4 chronic kidney disease, or unspecified chronic kidney disease: Secondary | ICD-10-CM | POA: Diagnosis not present

## 2023-08-21 DIAGNOSIS — K2990 Gastroduodenitis, unspecified, without bleeding: Secondary | ICD-10-CM | POA: Diagnosis not present

## 2023-08-21 DIAGNOSIS — I2510 Atherosclerotic heart disease of native coronary artery without angina pectoris: Secondary | ICD-10-CM | POA: Diagnosis not present

## 2023-08-21 DIAGNOSIS — K2100 Gastro-esophageal reflux disease with esophagitis, without bleeding: Secondary | ICD-10-CM | POA: Diagnosis not present

## 2023-09-03 ENCOUNTER — Encounter: Payer: Self-pay | Admitting: Internal Medicine

## 2023-09-03 ENCOUNTER — Ambulatory Visit (INDEPENDENT_AMBULATORY_CARE_PROVIDER_SITE_OTHER): Payer: Medicare Other | Admitting: Internal Medicine

## 2023-09-03 VITALS — BP 126/68 | HR 80 | Temp 98.4°F | Resp 16 | Ht <= 58 in

## 2023-09-03 DIAGNOSIS — D508 Other iron deficiency anemias: Secondary | ICD-10-CM | POA: Diagnosis not present

## 2023-09-03 DIAGNOSIS — I2510 Atherosclerotic heart disease of native coronary artery without angina pectoris: Secondary | ICD-10-CM

## 2023-09-03 DIAGNOSIS — N1832 Chronic kidney disease, stage 3b: Secondary | ICD-10-CM

## 2023-09-03 LAB — LIPID PANEL
Cholesterol: 145 mg/dL (ref 0–200)
HDL: 69.4 mg/dL (ref >39.00)
LDL Cholesterol: 58 mg/dL (ref 0–99)
NonHDL: 75.92
Total CHOL/HDL Ratio: 2
Triglycerides: 88 mg/dL (ref 0.0–149.0)
VLDL: 17.6 mg/dL (ref 0.0–40.0)

## 2023-09-03 LAB — BASIC METABOLIC PANEL
BUN: 20 mg/dL (ref 6–23)
CO2: 31 meq/L (ref 19–32)
Calcium: 9.2 mg/dL (ref 8.4–10.5)
Chloride: 108 meq/L (ref 96–112)
Creatinine, Ser: 0.81 mg/dL (ref 0.40–1.20)
GFR: 65.02 mL/min (ref >60.00)
Glucose, Bld: 72 mg/dL (ref 70–99)
Potassium: 4 meq/L (ref 3.5–5.1)
Sodium: 145 meq/L (ref 135–145)

## 2023-09-03 LAB — CBC WITH DIFFERENTIAL/PLATELET
Basophils Absolute: 0.1 10*3/uL (ref 0.0–0.1)
Basophils Relative: 1 % (ref 0.0–3.0)
Eosinophils Absolute: 0.2 10*3/uL (ref 0.0–0.7)
Eosinophils Relative: 3.7 % (ref 0.0–5.0)
HCT: 34.6 % — ABNORMAL LOW (ref 36.0–46.0)
Hemoglobin: 11 g/dL — ABNORMAL LOW (ref 12.0–15.0)
Lymphocytes Relative: 10.2 % — ABNORMAL LOW (ref 12.0–46.0)
Lymphs Abs: 0.6 10*3/uL — ABNORMAL LOW (ref 0.7–4.0)
MCHC: 31.9 g/dL (ref 30.0–36.0)
MCV: 83.8 fl (ref 78.0–100.0)
Monocytes Absolute: 0.5 10*3/uL (ref 0.1–1.0)
Monocytes Relative: 10.1 % (ref 3.0–12.0)
Neutro Abs: 4.1 10*3/uL (ref 1.4–7.7)
Neutrophils Relative %: 75 % (ref 43.0–77.0)
Platelets: 293 10*3/uL (ref 150.0–400.0)
RBC: 4.12 Mil/uL (ref 3.87–5.11)
RDW: 18.2 % — ABNORMAL HIGH (ref 11.5–15.5)
WBC: 5.4 10*3/uL (ref 4.0–10.5)

## 2023-09-03 LAB — IBC + FERRITIN
Ferritin: 24.6 ng/mL (ref 10.0–291.0)
Iron: 54 ug/dL (ref 42–145)
Saturation Ratios: 18.3 % — ABNORMAL LOW (ref 20.0–50.0)
TIBC: 295.4 ug/dL (ref 250.0–450.0)
Transferrin: 211 mg/dL — ABNORMAL LOW (ref 212.0–360.0)

## 2023-09-03 NOTE — Progress Notes (Unsigned)
Subjective:  Patient ID: [name], female    DOB: 05/11/1933  Age: 89 y.o. MRN: [id number]  CC: Anemia   HPI [name] presents for f/up ----  Discussed the use of AI scribe software for clinical note transcription with the patient, who gave verbal consent to proceed.  History of Present Illness   The patient presents with concerns about leg swelling and acid reflux. She reports experiencing discomfort when walking long distances, and have noticed swelling in their legs, which she attributes to a previous history of blood clots. The swelling is associated with pain, particularly upon pressure. She also reports difficulty with self-care tasks such as putting on socks due to the swelling.  In addition to the leg swelling, the patient has been experiencing acid reflux. She is currently on a regimen of pantoprazole, taken twice daily. Despite this, she continues to experience indigestion. She denies any difficulty or pain with swallowing. A previous CT scan reportedly identified an ulcer extending into the esophagus.  The patient also reports changes in bowel habits, with stools described as "little balls." She is currently taking a stool softener for this issue. She denies any constipation or blood in the stool.  The patient has a history of anemia, which required blood transfusions during a previous hospital admission due to low hemoglobin levels. She is currently on an iron supplement. The patient expresses concern about their hemoglobin levels, indicating a desire to monitor this closely.       Outpatient Medications Prior to Visit  Medication Sig Dispense Refill   famotidine (PEPCID) 20 MG tablet Take 1 tablet (20 mg total) by mouth 3 times/day as needed-between meals & bedtime for heartburn or indigestion.     FEROSUL 325 (65 Fe) MG tablet TAKE 1 TABLET(325 MG) BY MOUTH DAILY WITH BREAKFAST 90 tablet 1   gabapentin (NEURONTIN) 400 MG capsule Take 1 capsule (400 mg total) by mouth at  bedtime. 90 capsule 1   latanoprost (XALATAN) 0.005 % ophthalmic solution INT 1 GTT IN OD QD     metoprolol succinate (TOPROL-XL) 25 MG 24 hr tablet Take 1 tablet (25 mg total) by mouth daily. 90 tablet 1   Multiple Vitamin (MULTIVITAMIN) tablet Take 1 tablet by mouth daily.     Multiple Vitamins-Minerals (HAIR SKIN & NAILS ADVANCED) TABS Take 1 mg by mouth daily.     Olopatadine HCl (PATADAY OP) Apply 1 drop to eye daily.     pantoprazole (PROTONIX) 40 MG tablet TAKE 1 TABLET BY MOUTH 1 TO 2 TIMES DAILY 180 tablet 0   polyethylene glycol powder (GLYCOLAX/MIRALAX) 17 GM/SCOOP powder Dissolve 17 g as directed and drink daily. 238 g 0   potassium chloride SA (KLOR-CON M) 20 MEQ tablet TAKE 1 TABLET(20 MEQ) BY MOUTH DAILY 30 tablet 3   traMADol-acetaminophen (ULTRACET) 37.5-325 MG tablet Take 1 tablet by mouth every 8 (eight) hours as needed. 60 tablet 3   verapamil (CALAN-SR) 240 MG CR tablet Take 1 tablet (240 mg total) by mouth daily. 90 tablet 1   XARELTO 10 MG TABS tablet TAKE 1 TABLET(10 MG) BY MOUTH DAILY 90 tablet 0   No facility-administered medications prior to visit.    ROS Review of Systems  Constitutional:  Negative for chills, diaphoresis and fatigue.  HENT: Negative.    Eyes: Negative.   Respiratory:  Negative for cough, chest tightness, shortness of breath and wheezing.   Cardiovascular:  Negative for chest pain, palpitations and leg swelling.  Gastrointestinal:  Positive for  constipation. Negative for blood in stool, diarrhea, nausea and vomiting.  Genitourinary: Negative.  Negative for difficulty urinating.  Musculoskeletal:  Positive for arthralgias. Negative for myalgias.  Skin: Negative.   Neurological: Negative.  Negative for dizziness, weakness and light-headedness.  Hematological:  Negative for adenopathy. Does not bruise/bleed easily.  Psychiatric/Behavioral:  Positive for confusion.     Objective:  BP 126/68 (BP Location: Left Arm, Patient Position: Sitting,  Cuff Size: Large)   Pulse 80   Temp 98.4 F (36.9 C) (Oral)   Resp 16   Ht 4\' 10"  (1.473 m)   SpO2 93%   BMI 24.24 kg/m   BP Readings from Last 3 Encounters:  09/03/23 126/68  05/10/23 120/60  04/27/23 (!) 147/79    Wt Readings from Last 3 Encounters:  05/10/23 116 lb (52.6 kg)  04/23/23 116 lb (52.6 kg)  04/24/22 116 lb (52.6 kg)    Physical Exam Vitals reviewed.  HENT:     Mouth/Throat:     Mouth: Mucous membranes are moist.  Eyes:     General: No scleral icterus.    Conjunctiva/sclera: Conjunctivae normal.  Cardiovascular:     Rate and Rhythm: Normal rate and regular rhythm.     Heart sounds: No murmur heard. Pulmonary:     Effort: No respiratory distress.     Breath sounds: No stridor. No wheezing, rhonchi or rales.  Chest:     Chest wall: No tenderness.  Abdominal:     General: Abdomen is flat.     Palpations: There is no mass.     Tenderness: There is no abdominal tenderness. There is no guarding.     Hernia: No hernia is present.  Musculoskeletal:        General: Normal range of motion.     Cervical back: Neck supple.     Right lower leg: 1+ Pitting Edema (1+ pitting) present.     Left lower leg: 1+ Pitting Edema present.  Lymphadenopathy:     Cervical: No cervical adenopathy.  Skin:    General: Skin is warm and dry.  Neurological:     General: No focal deficit present.     Comments: In a wheelchair  Psychiatric:        Mood and Affect: Mood normal.        Behavior: Behavior normal.     Lab Results  Component Value Date   WBC 6.5 05/10/2023   HGB 9.7 (L) 05/10/2023   HCT 30.3 (L) 05/10/2023   PLT 427.0 (H) 05/10/2023   GLUCOSE 118 (H) 04/27/2023   CHOL 175 11/03/2019   TRIG 79 11/03/2019   HDL 82 11/03/2019   LDLCALC 78 11/03/2019   ALT 13 04/23/2023   AST 15 04/23/2023   NA 143 04/27/2023   K 4.0 04/27/2023   CL 110 04/27/2023   CREATININE 0.84 04/27/2023   BUN 19 04/27/2023   CO2 25 04/27/2023   TSH 1.90 05/10/2023   INR 1.3  (H) 04/23/2023   HGBA1C 5.4 05/30/2017    CT Head Wo Contrast  Result Date: 04/23/2023 CLINICAL DATA:  Altered mental status EXAM: CT HEAD WITHOUT CONTRAST TECHNIQUE: Contiguous axial images were obtained from the base of the skull through the vertex without intravenous contrast. RADIATION DOSE REDUCTION: This exam was performed according to the departmental dose-optimization program which includes automated exposure control, adjustment of the mA and/or kV according to patient size and/or use of iterative reconstruction technique. COMPARISON:  CT head 06/15/10 FINDINGS: Brain: No evidence of acute  infarction, hemorrhage, hydrocephalus, extra-axial collection or mass lesion/mass effect. Sequela of mild chronic microvascular ischemic change. Vascular: No hyperdense vessel or unexpected calcification. Skull: Normal. Negative for fracture or focal lesion. Sinuses/Orbits: No middle ear or mastoid effusion. Paranasal sinuses are clear. Bilateral lens replacement. Orbits are otherwise unremarkable. Other: None. IMPRESSION: No acute intracranial abnormality. Electronically Signed   By: Lorenza Cambridge M.D.   On: 04/23/2023 12:24   DG Chest Portable 1 View  Result Date: 04/23/2023 CLINICAL DATA:  Dizziness.  Infection EXAM: PORTABLE CHEST 1 VIEW COMPARISON:  X-ray 08/07/2022 FINDINGS: Film is rotated to the right. Tortuous and ectatic aorta. Normal cardiopericardial silhouette. Presumed hiatal hernia. No consolidation, pneumothorax or effusion. No edema. Overlapping cardiac leads. Degenerative changes seen of the shoulders. IMPRESSION: Rotated radiograph.  Hiatal hernia. No consolidation or pneumothorax. Electronically Signed   By: Karen Kays M.D.   On: 04/23/2023 11:30    Assessment & Plan:  Coronary artery disease involving native coronary artery of native heart without angina pectoris -     Lipid panel; Future  Iron deficiency anemia secondary to inadequate dietary iron intake -     CBC with  Differential/Platelet; Future -     IBC + Ferritin; Future  Stage 3b chronic kidney disease (HCC) -     Basic metabolic panel; Future     Follow-up: Return in about 4 months (around 01/03/2024).  Sanda Linger, MD

## 2023-09-03 NOTE — Patient Instructions (Signed)
Iron Deficiency Anemia, Adult  Iron deficiency anemia is a condition in which the concentration of red blood cells or hemoglobin in the blood is below normal because of too little iron. Hemoglobin is a substance in red blood cells that carries oxygen to the body's tissues. When the concentration of red blood cells or hemoglobin is too low, not enough oxygen reaches these tissues. Iron deficiency anemia is usually long-lasting, and it develops over time. It may or may not cause symptoms. It is a common type of anemia. What are the causes? This condition may be caused by: Not enough iron in the diet. Abnormal absorption in the gut. Blood loss. What increases the risk? You are more likely to develop this condition if you get menstrual periods (menstruate) or are pregnant. What are the signs or symptoms? Symptoms of this condition may include: Pale skin, lips, and nail beds. Weakness, dizziness, and getting tired easily. Shortness of breath when moving or exercising. Cold hands or feet. Mild anemia may not cause any symptoms. How is this diagnosed? This condition is diagnosed based on: Your medical history. A physical exam. Blood tests. How is this treated? This condition is treated by correcting the cause of your iron deficiency. Treatment may involve: Adding iron-rich foods to your diet. Taking iron supplements. If you are pregnant or breastfeeding, you may need to take extra iron because your normal diet usually does not provide the amount of iron that you need. Increasing vitamin C intake. Vitamin C helps your body absorb iron. Your health care provider may recommend that you take iron supplements along with a glass of orange juice or a vitamin C supplement. Medicines to make heavy menstrual flow lighter. Surgery or additional testing procedures to determine the cause of your anemia. You may need repeat blood tests to determine whether treatment is working. If the treatment does not  seem to be working, you may need more tests. Follow these instructions at home: Medicines Take over-the-counter and prescription medicines only as told by your health care provider. This includes iron supplements and vitamins. This is important because too much iron can be harmful. For the best iron absorption, you should take iron supplements when your stomach is empty. If you cannot tolerate them on an empty stomach, you may need to take them with food. Do not drink milk or take antacids at the same time as your iron supplements. Milk and antacids may interfere with how your body absorbs iron. Iron supplements may turn stool (feces) a darker color and it may appear black. If you cannot tolerate taking iron supplements by mouth, talk with your health care provider about taking them through an IV or through an injection into a muscle. Eating and drinking Talk with your health care provider before changing your diet. Your provider may recommend that you eat foods that contain a lot of iron, such as: Liver. Low-fat (lean) beef. Breads and cereals that have iron added to them (are fortified). Eggs. Dried fruit. Dark green, leafy vegetables. To help your body use the iron from iron-rich foods, eat those foods at the same time as fresh fruits and vegetables that are high in vitamin C. Foods that are high in vitamin C include: Oranges. Peppers. Tomatoes. Mangoes. Managing constipation If you are taking an iron supplement, it may cause constipation. To prevent or treat constipation, you may need to: Drink enough fluid to keep your urine pale yellow. Take over-the-counter or prescription medicines. Eat foods that are high in fiber, such   as beans, whole grains, and fresh fruits and vegetables. Limit foods that are high in fat and processed sugars, such as fried or sweet foods. General instructions Return to your normal activities as told by your health care provider. Ask your health care provider  what activities are safe for you. Keep all follow-up visits. Contact a health care provider if: You feel nauseous or you vomit. You feel weak. You become light-headed when getting up from a sitting or lying down position. You have unexplained sweating. You develop symptoms of constipation. You have a heaviness in your chest. You have trouble breathing with physical activity. Get help right away if: You faint. If this happens, do not drive yourself to the hospital. You have an irregular or rapid heartbeat. Summary Iron deficiency anemia is a condition in which the concentration of red blood cells or hemoglobin in the blood is below normal because of too little iron. This condition is treated by correcting the cause of your iron deficiency. Take over-the-counter and prescription medicines only as told by your health care provider. This includes iron supplements and vitamins. To help your body use the iron from iron-rich foods, eat those foods at the same time as fresh fruits and vegetables that are high in vitamin C. Seek medical help if you have signs or symptoms of worsening anemia. This information is not intended to replace advice given to you by your health care provider. Make sure you discuss any questions you have with your health care provider. Document Revised: 01/18/2022 Document Reviewed: 01/18/2022 Elsevier Patient Education  2024 [employer]  

## 2023-09-22 ENCOUNTER — Other Ambulatory Visit: Payer: Self-pay | Admitting: Internal Medicine

## 2023-09-22 DIAGNOSIS — K219 Gastro-esophageal reflux disease without esophagitis: Secondary | ICD-10-CM

## 2023-10-03 DIAGNOSIS — H401131 Primary open-angle glaucoma, bilateral, mild stage: Secondary | ICD-10-CM | POA: Diagnosis not present

## 2023-10-03 DIAGNOSIS — H1045 Other chronic allergic conjunctivitis: Secondary | ICD-10-CM | POA: Diagnosis not present

## 2023-10-03 DIAGNOSIS — Z961 Presence of intraocular lens: Secondary | ICD-10-CM | POA: Diagnosis not present

## 2023-10-03 DIAGNOSIS — H353131 Nonexudative age-related macular degeneration, bilateral, early dry stage: Secondary | ICD-10-CM | POA: Diagnosis not present

## 2023-10-03 DIAGNOSIS — H04123 Dry eye syndrome of bilateral lacrimal glands: Secondary | ICD-10-CM | POA: Diagnosis not present

## 2023-10-11 DIAGNOSIS — Z23 Encounter for immunization: Secondary | ICD-10-CM | POA: Diagnosis not present

## 2023-10-19 ENCOUNTER — Ambulatory Visit (HOSPITAL_COMMUNITY): Payer: Medicare Other

## 2023-10-19 ENCOUNTER — Encounter (HOSPITAL_COMMUNITY): Payer: Self-pay | Admitting: *Deleted

## 2023-10-19 ENCOUNTER — Ambulatory Visit (HOSPITAL_COMMUNITY)
Admission: EM | Admit: 2023-10-19 | Discharge: 2023-10-19 | Disposition: A | Discharge status: Home / Self Care | Payer: Medicare Other | Attending: Internal Medicine | Admitting: Internal Medicine

## 2023-10-19 DIAGNOSIS — S9032XA Contusion of left foot, initial encounter: Secondary | ICD-10-CM

## 2023-10-19 DIAGNOSIS — M19072 Primary osteoarthritis, left ankle and foot: Secondary | ICD-10-CM | POA: Diagnosis not present

## 2023-10-19 DIAGNOSIS — M7989 Other specified soft tissue disorders: Secondary | ICD-10-CM | POA: Diagnosis not present

## 2023-10-19 DIAGNOSIS — M7732 Calcaneal spur, left foot: Secondary | ICD-10-CM | POA: Diagnosis not present

## 2023-10-19 DIAGNOSIS — M25572 Pain in left ankle and joints of left foot: Secondary | ICD-10-CM | POA: Diagnosis not present

## 2023-10-19 NOTE — ED Provider Notes (Signed)
[facility]    CSN: [id number] Arrival date & time: 10/19/23  1839      History   Chief Complaint Chief Complaint  Patient presents with   Foot Pain    HPI [name] is a 89 y.o. female who presents with her granddaughter L dorsal foot pain after she droped an alcohol bottle. Has chronic feet pain from arthritis. She has applied ice today.     Past Medical History:  Diagnosis Date   Abnormal finding on Pap smear, ASCUS    Acute renal insufficiency    Amputation finger    index   Barrett's esophagus    CAD (coronary artery disease)    Dementia (HCC)    Diverticulosis    Elevated BP    Foot deformity    GERD (gastroesophageal reflux disease)    Glaucoma    H/O: GI bleed    HSV-2 (herpes simplex virus 2) infection    Hyperglycemia    Hypertension    Migraine    Osteoporosis    worsening   Spondylosis     Patient Active Problem List   Diagnosis Date Noted   Gastritis and gastroduodenitis 04/24/2023   Gastroesophageal reflux disease with esophagitis without hemorrhage 04/23/2023   Barrett's esophagus without dysplasia 04/23/2023   Acquired thrombophilia (HCC) 03/05/2023   Chronic deep vein thrombosis (DVT) of right femoral vein (HCC) 03/05/2023   Closed wedge compression fracture of thoracic vertebra (HCC) 08/10/2022   Myofascial pain syndrome 07/07/2021   Stage 3b chronic kidney disease (HCC) 05/05/2021   Degenerative disc disease, lumbar 01/01/2018   Hiatal hernia 11/04/2016   Gastroesophageal reflux disease without esophagitis 07/31/2014   Dementia (HCC)    Spondylosis    Migraine    CAD (coronary artery disease)    HSV-2 (herpes simplex virus 2) infection    Osteoporosis    Constipation due to slow transit 12/12/2010   ANEMIA, IRON DEFICIENCY 06/17/2008   Essential hypertension 06/16/2008    Past Surgical History:  Procedure Laterality Date   AMPUTATION FINGER / THUMB     secondary to osteomyelitis   BIOPSY  04/24/2023   Procedure:  BIOPSY;  Surgeon: Shellia Cleverly, DO;  Location: [facility];  Service: Gastroenterology;;   CATARACT EXTRACTION, BILATERAL Bilateral 12/25/2016   COLONOSCOPY     ESOPHAGOGASTRODUODENOSCOPY N/A 10/22/2016   Procedure: ESOPHAGOGASTRODUODENOSCOPY (EGD);  Surgeon: Sherrilyn Rist, MD;  Location[facility] ENDOSCOPY;  Service: Endoscopy;  Laterality: N/A;   ESOPHAGOGASTRODUODENOSCOPY N/A 04/24/2023   Procedure: ESOPHAGOGASTRODUODENOSCOPY (EGD);  Surgeon: Shellia Cleverly, DO;  Location: [facility] ENDOSCOPY;  Service: Gastroenterology;  Laterality: N/A;   FOOT SURGERY     right   PARTIAL HYSTERECTOMY     UPPER GASTROINTESTINAL ENDOSCOPY      OB History   No obstetric history on file.      Home Medications    Prior to Admission medications   Medication Sig Start Date End Date Taking? Authorizing Provider  famotidine (PEPCID) 20 MG tablet Take 1 tablet (20 mg total) by mouth 3 times/day as needed-between meals & bedtime for heartburn or indigestion. 04/27/23  Yes Elgergawy, Leana Roe, MD  gabapentin (NEURONTIN) 400 MG capsule Take 1 capsule (400 mg total) by mouth at bedtime. 01/27/22  Yes Etta Grandchild, MD  latanoprost (XALATAN) 0.005 % ophthalmic solution INT 1 GTT IN OD QD 08/26/19  Yes [provider]  metoprolol succinate (TOPROL-XL) 25 MG 24 hr tablet Take 1 tablet (25 mg total) by mouth daily. 05/10/23  Yes Etta Grandchild, MD  Multiple Vitamin (MULTIVITAMIN) tablet Take 1 tablet by mouth daily.   Yes [provider]  Multiple Vitamins-Minerals (HAIR SKIN & NAILS ADVANCED) TABS Take 1 mg by mouth daily.   Yes [provider]  Olopatadine HCl (PATADAY OP) Apply 1 drop to eye daily.   Yes [provider]  pantoprazole (PROTONIX) 40 MG tablet TAKE 1 TABLET BY MOUTH 1 TO 2 TIMES DAILY 09/22/23  Yes Etta Grandchild, MD  potassium chloride SA (KLOR-CON M) 20 MEQ tablet TAKE 1 TABLET(20 MEQ) BY MOUTH DAILY 04/17/23  Yes Etta Grandchild, MD  traMADol-acetaminophen  (ULTRACET) 37.5-325 MG tablet Take 1 tablet by mouth every 8 (eight) hours as needed. 05/30/23  Yes Etta Grandchild, MD  verapamil (CALAN-SR) 240 MG CR tablet Take 1 tablet (240 mg total) by mouth daily. 07/20/23  Yes Etta Grandchild, MD  XARELTO 10 MG TABS tablet TAKE 1 TABLET(10 MG) BY MOUTH DAILY 08/07/23  Yes Etta Grandchild, MD  FEROSUL 325 (65 Fe) MG tablet TAKE 1 TABLET(325 MG) BY MOUTH DAILY WITH BREAKFAST 06/23/22   Etta Grandchild, MD  polyethylene glycol powder (GLYCOLAX/MIRALAX) 17 GM/SCOOP powder Dissolve 17 g as directed and drink daily. 04/27/23   Elgergawy, Leana Roe, MD    Family History Family History  Problem Relation Age of Onset   Breast cancer Mother    Heart attack Mother    Heart disease Father    Alcohol abuse Son    Colon cancer Neg Hx    Stomach cancer Neg Hx    Rectal cancer Neg Hx     Social History Social History   Tobacco Use   Smoking status: Never   Smokeless tobacco: Never  Vaping Use   Vaping status: Never Used  Substance Use Topics   Alcohol use: No    Alcohol/week: 0.0 standard drinks of alcohol   Drug use: No     Allergies   Patient has no known allergies.   Review of Systems Review of Systems As noted in HPI  Physical Exam Triage Vital Signs ED Triage Vitals  Encounter Vitals Group     BP 10/19/23 1917 (!) 160/83     Systolic BP Percentile --      Diastolic BP Percentile --      Pulse Rate 10/19/23 1917 71     Resp 10/19/23 1917 18     Temp 10/19/23 1917 99 F (37.2 C)     Temp Source 10/19/23 1917 Oral     SpO2 10/19/23 1917 96 %     Weight --      Height --      Head Circumference --      Peak Flow --      Pain Score 10/19/23 1915 8     Pain Loc --      Pain Education --      Exclude from Growth Chart --    No data found.  Updated Vital Signs BP (!) 160/83 (BP Location: Right Arm)   Pulse 71   Temp 99 F (37.2 C) (Oral)   Resp 18   SpO2 96%   Visual Acuity Right Eye Distance:   Left Eye Distance:   Bilateral  Distance:    Right Eye Near:   Left Eye Near:    Bilateral Near:     Physical Exam Vitals reviewed.  Constitutional:      General: She is not in acute distress.  Appearance: She is not toxic-appearing.  Eyes:     General: No scleral icterus.    Conjunctiva/sclera: Conjunctivae normal.  Pulmonary:     Effort: Pulmonary effort is normal.  Musculoskeletal:     Comments: L FOOT with no ecchymosis or swelling. Has some deformity of her 4th toe from arthritis. Has local tenderness on proximal 4th metacarpal region  Skin:    General: Skin is warm and dry.     Findings: No bruising or erythema.  Neurological:     Mental Status: She is alert and oriented to person, place, and time.     Comments: She is sitting on a wheelchair  Psychiatric:        Mood and Affect: Mood normal.        Behavior: Behavior normal.      UC Treatments / Results  Labs (all labs ordered are listed, but only abnormal results are displayed) Labs Reviewed - No data to display  EKG   Radiology No results found.  Procedures Procedures (including critical care time)  Medications Ordered in UC Medications - No data to display  Initial Impression / Assessment and Plan / UC Course  I have reviewed the triage vital signs and the nursing notes.  Pertinent  imaging results that were available during my care of the patient were reviewed by me and considered in my medical decision making (see chart for details).  L foot contusion  Advised to continue icing, see instruction.     Final Clinical Impressions(s) / UC Diagnoses   Final diagnoses:  Contusion of left foot, initial encounter     Discharge Instructions      The preliminary read of the xray is negative, but if the radiologist reads something different we will call you Apply ice on your injured foot for 20 minutes 3-4 times a day for 48 hours, make sure to place cloth over the skin.      ED Prescriptions   None    PDMP not  reviewed this encounter.   Garey Ham, Cordelia Poche 10/19/23 2035

## 2023-10-19 NOTE — Discharge Instructions (Signed)
The preliminary read of the xray is negative, but if the radiologist reads something different we will call you Apply ice on your injured foot for 20 minutes 3-4 times a day for 48 hours, make sure to place cloth over the skin.

## 2023-10-19 NOTE — ED Triage Notes (Signed)
Pt states that she has been having bilateral foot pain. She is hurting more on the left foot since she dropped a bottle of alcohol on it today. She states she takes tramadol for the foot pain.   Her niece states that she is on blood thinners due to bilateral blood clot in her legs.

## 2023-11-02 ENCOUNTER — Other Ambulatory Visit: Payer: Self-pay | Admitting: Internal Medicine

## 2023-11-02 DIAGNOSIS — D6869 Other thrombophilia: Secondary | ICD-10-CM

## 2023-11-02 DIAGNOSIS — I82511 Chronic embolism and thrombosis of right femoral vein: Secondary | ICD-10-CM

## 2023-11-05 ENCOUNTER — Other Ambulatory Visit: Payer: Self-pay | Admitting: Internal Medicine

## 2023-11-05 DIAGNOSIS — I2510 Atherosclerotic heart disease of native coronary artery without angina pectoris: Secondary | ICD-10-CM

## 2023-11-05 DIAGNOSIS — I10 Essential (primary) hypertension: Secondary | ICD-10-CM

## 2023-11-06 ENCOUNTER — Ambulatory Visit: Payer: Medicare Other | Admitting: Internal Medicine

## 2023-11-06 ENCOUNTER — Ambulatory Visit (INDEPENDENT_AMBULATORY_CARE_PROVIDER_SITE_OTHER): Payer: Medicare Other

## 2023-11-06 ENCOUNTER — Encounter: Payer: Self-pay | Admitting: Internal Medicine

## 2023-11-06 VITALS — BP 118/58 | HR 73 | Temp 98.1°F

## 2023-11-06 DIAGNOSIS — R739 Hyperglycemia, unspecified: Secondary | ICD-10-CM | POA: Diagnosis not present

## 2023-11-06 DIAGNOSIS — I10 Essential (primary) hypertension: Secondary | ICD-10-CM

## 2023-11-06 DIAGNOSIS — M7989 Other specified soft tissue disorders: Secondary | ICD-10-CM | POA: Diagnosis not present

## 2023-11-06 DIAGNOSIS — M79672 Pain in left foot: Secondary | ICD-10-CM

## 2023-11-06 DIAGNOSIS — M2012 Hallux valgus (acquired), left foot: Secondary | ICD-10-CM | POA: Diagnosis not present

## 2023-11-06 DIAGNOSIS — L03116 Cellulitis of left lower limb: Secondary | ICD-10-CM

## 2023-11-06 DIAGNOSIS — M19072 Primary osteoarthritis, left ankle and foot: Secondary | ICD-10-CM | POA: Diagnosis not present

## 2023-11-06 DIAGNOSIS — M7732 Calcaneal spur, left foot: Secondary | ICD-10-CM | POA: Diagnosis not present

## 2023-11-06 MED ORDER — DOXYCYCLINE HYCLATE 100 MG PO TABS: 100.0000 mg | ORAL_TABLET | Freq: Two times a day (BID) | ORAL | 0 refills | Status: DC

## 2023-11-06 MED ORDER — CEFTRIAXONE SODIUM 1 G IJ SOLR
1.0000 g | Freq: Once | INTRAMUSCULAR | Status: AC
  Administered 2023-11-06: 1 g via INTRAMUSCULAR

## 2023-11-06 NOTE — Progress Notes (Unsigned)
Patient ID: [name], female   DOB: 11-20-1933, 89 y.o.   MRN: [id number]        Chief Complaint: follow up left foot cellulitis, htn, hyperglycemia       HPI:  [name] is a 89 y.o. female here with c/o dropped object on the left distal foot over 1 wk ago with bruising, but now red, tender swelling in the last 3 days, without fever, chills.  Pt denies chest pain, increased sob or doe, wheezing, orthopnea, PND, increased LE swelling, palpitations, dizziness or syncope.   Pt denies polydipsia, polyuria, or new focal neuro s/s.          Wt Readings from Last 3 Encounters:  05/10/23 116 lb (52.6 kg)  04/23/23 116 lb (52.6 kg)  04/24/22 116 lb (52.6 kg)   BP Readings from Last 3 Encounters:  11/06/23 (!) 118/58  10/19/23 (!) 160/83  09/03/23 126/68         Past Medical History:  Diagnosis Date   Abnormal finding on Pap smear, ASCUS    Acute renal insufficiency    Amputation finger    index   Barrett's esophagus    CAD (coronary artery disease)    Dementia (HCC)    Diverticulosis    Elevated BP    Foot deformity    GERD (gastroesophageal reflux disease)    Glaucoma    H/O: GI bleed    HSV-2 (herpes simplex virus 2) infection    Hyperglycemia    Hypertension    Migraine    Osteoporosis    worsening   Spondylosis    Past Surgical History:  Procedure Laterality Date   AMPUTATION FINGER / THUMB     secondary to osteomyelitis   BIOPSY  04/24/2023   Procedure: BIOPSY;  Surgeon: Shellia Cleverly, DO;  Location: [facility] ENDOSCOPY;  Service: Gastroenterology;;   CATARACT EXTRACTION, BILATERAL Bilateral 12/25/2016   COLONOSCOPY     ESOPHAGOGASTRODUODENOSCOPY N/A 10/22/2016   Procedure: ESOPHAGOGASTRODUODENOSCOPY (EGD);  Surgeon: Sherrilyn Rist, MD;  Location: [facility] ENDOSCOPY;  Service: Endoscopy;  Laterality: N/A;   ESOPHAGOGASTRODUODENOSCOPY N/A 04/24/2023   Procedure: ESOPHAGOGASTRODUODENOSCOPY (EGD);  Surgeon: Shellia Cleverly, DO;  Location: [facility] ENDOSCOPY;  Service:  Gastroenterology;  Laterality: N/A;   FOOT SURGERY     right   PARTIAL HYSTERECTOMY     UPPER GASTROINTESTINAL ENDOSCOPY      reports that she has never smoked. She has never used smokeless tobacco. She reports that she does not drink alcohol and does not use drugs. family history includes Alcohol abuse in her son; Breast cancer in her mother; Heart attack in her mother; Heart disease in her father. No Known Allergies Current Outpatient Medications on File Prior to Visit  Medication Sig Dispense Refill   FEROSUL 325 (65 Fe) MG tablet TAKE 1 TABLET(325 MG) BY MOUTH DAILY WITH BREAKFAST 90 tablet 1   latanoprost (XALATAN) 0.005 % ophthalmic solution INT 1 GTT IN OD QD     metoprolol succinate (TOPROL-XL) 25 MG 24 hr tablet TAKE 1 TABLET(25 MG) BY MOUTH DAILY 90 tablet 1   Multiple Vitamin (MULTIVITAMIN) tablet Take 1 tablet by mouth daily.     Multiple Vitamins-Minerals (HAIR SKIN & NAILS ADVANCED) TABS Take 1 mg by mouth daily.     Olopatadine HCl (PATADAY OP) Apply 1 drop to eye daily.     pantoprazole (PROTONIX) 40 MG tablet TAKE 1 TABLET BY MOUTH 1 TO 2 TIMES DAILY 180 tablet 0   traMADol-acetaminophen (ULTRACET) 37.5-325  MG tablet Take 1 tablet by mouth every 8 (eight) hours as needed. 60 tablet 3   verapamil (CALAN-SR) 240 MG CR tablet Take 1 tablet (240 mg total) by mouth daily. 90 tablet 1   XARELTO 10 MG TABS tablet TAKE 1 TABLET(10 MG) BY MOUTH DAILY 90 tablet 0   famotidine (PEPCID) 20 MG tablet Take 1 tablet (20 mg total) by mouth 3 times/day as needed-between meals & bedtime for heartburn or indigestion.     gabapentin (NEURONTIN) 400 MG capsule Take 1 capsule (400 mg total) by mouth at bedtime. (Patient not taking: Reported on 11/06/2023) 90 capsule 1   polyethylene glycol powder (GLYCOLAX/MIRALAX) 17 GM/SCOOP powder Dissolve 17 g as directed and drink daily. (Patient not taking: Reported on 11/06/2023) 238 g 0   No current facility-administered medications on file prior to  visit.        ROS:  All others reviewed and negative.  Objective        PE:  BP (!) 118/58   Pulse 73   Temp 98.1 F (36.7 C) (Oral)   SpO2 99%                 Constitutional: Pt appears in NAD               HENT: Head: NCAT.                Right Ear: External ear normal.                 Left Ear: External ear normal.                Eyes: . Pupils are equal, round, and reactive to light. Conjunctivae and EOM are normal               Nose: without d/c or deformity               Neck: Neck supple. Gross normal ROM               Cardiovascular: Normal rate and regular rhythm.                 Pulmonary/Chest: Effort normal and breath sounds without rales or wheezing.                Abd:  Soft, NT, ND, + BS, no organomegaly               Neurological: Pt is alert. At baseline orientation, motor grossly intact               Skin:, LE edema - none; left dorsal foot red tender swelling with bruising across the MTPs as well, all approx 4 x 6 cm area without ulcer or red streaks               Psychiatric: Pt behavior is normal without agitation   Micro: none  Cardiac tracings I have personally interpreted today:  none  Pertinent Radiological findings (summarize): none   Lab Results  Component Value Date   WBC 5.4 09/03/2023   HGB 11.0 (L) 09/03/2023   HCT 34.6 (L) 09/03/2023   PLT 293.0 09/03/2023   GLUCOSE 72 09/03/2023   CHOL 145 09/03/2023   TRIG 88.0 09/03/2023   HDL 69.40 09/03/2023   LDLCALC 58 09/03/2023   ALT 13 04/23/2023   AST 15 04/23/2023   NA 145 09/03/2023   K 4.0 09/03/2023   CL 108 09/03/2023   CREATININE 0.81  09/03/2023   BUN 20 09/03/2023   CO2 31 09/03/2023   TSH 1.90 05/10/2023   INR 1.3 (H) 04/23/2023   HGBA1C 5.4 05/30/2017   Assessment/Plan:  [name] is a 89 y.o. Black or African American [2] female with  has a past medical history of Abnormal finding on Pap smear, ASCUS, Acute renal insufficiency, Amputation finger, Barrett's esophagus, CAD  (coronary artery disease), Dementia (HCC), Diverticulosis, Elevated BP, Foot deformity, GERD (gastroesophageal reflux disease), Glaucoma, H/O: GI bleed, HSV-2 (herpes simplex virus 2) infection, Hyperglycemia, Hypertension, Migraine, Osteoporosis, and Spondylosis.  Cellulitis of left foot Mild to mod, for rocephin 1 gm now, doxycycline 100 bid, xray r/o fx, cont tramadol prn,  to f/u any worsening symptoms or concerns  Essential hypertension BP Readings from Last 3 Encounters:  11/06/23 (!) 118/58  10/19/23 (!) 160/83  09/03/23 126/68   Low normal asympt, non toxic, pt to continue medical treatment toprol xl 25 every day, calan sr 240 qd   Hyperglycemia Lab Results  Component Value Date   HGBA1C 5.4 05/30/2017   Stable, pt to continue current medical treatment  - diet, wt control  Followup: Return if symptoms worsen or fail to improve.  Oliver Barre, MD 11/07/2023 6:43 PM [facility]

## 2023-11-06 NOTE — Patient Instructions (Signed)
You had the antibiotic shot today  Please take all new medication as prescribed - the pill antibiotic  Please continue all other medications as before, including the tramadol  Please have the pharmacy call with any other refills you may need.  Please keep your appointments with your specialists as you may have planned  Please go to the XRAY Department in the first floor for the x-ray testing  You will be contacted by phone if any changes need to be made immediately.  Otherwise, you will receive a letter about your results with an explanation, but please check with MyChart first.

## 2023-11-07 ENCOUNTER — Encounter: Payer: Self-pay | Admitting: Internal Medicine

## 2023-11-07 DIAGNOSIS — L03116 Cellulitis of left lower limb: Secondary | ICD-10-CM | POA: Insufficient documentation

## 2023-11-07 DIAGNOSIS — R739 Hyperglycemia, unspecified: Secondary | ICD-10-CM | POA: Insufficient documentation

## 2023-11-07 NOTE — Assessment & Plan Note (Signed)
Lab Results  Component Value Date   HGBA1C 5.4 05/30/2017   Stable, pt to continue current medical treatment  - diet, wt control

## 2023-11-07 NOTE — Assessment & Plan Note (Signed)
Mild to mod, for rocephin 1 gm now, doxycycline 100 bid, xray r/o fx, cont tramadol prn,  to f/u any worsening symptoms or concerns

## 2023-11-07 NOTE — Assessment & Plan Note (Signed)
BP Readings from Last 3 Encounters:  11/06/23 (!) 118/58  10/19/23 (!) 160/83  09/03/23 126/68   Low normal asympt, non toxic, pt to continue medical treatment toprol xl 25 every day, calan sr 240 qd

## 2023-11-14 ENCOUNTER — Encounter: Payer: Self-pay | Admitting: Podiatry

## 2023-11-14 ENCOUNTER — Ambulatory Visit (INDEPENDENT_AMBULATORY_CARE_PROVIDER_SITE_OTHER): Payer: Medicare Other | Admitting: Podiatry

## 2023-11-14 DIAGNOSIS — L84 Corns and callosities: Secondary | ICD-10-CM

## 2023-11-14 DIAGNOSIS — D689 Coagulation defect, unspecified: Secondary | ICD-10-CM

## 2023-11-14 DIAGNOSIS — B351 Tinea unguium: Secondary | ICD-10-CM

## 2023-11-14 DIAGNOSIS — M79676 Pain in unspecified toe(s): Secondary | ICD-10-CM | POA: Diagnosis not present

## 2023-11-14 NOTE — Progress Notes (Signed)
  Subjective:  Patient ID: [name], female    DOB: [birth year],  MRN: [id number]  [name] presents to clinic today for at risk foot care with h/o clotting disorder and callus(es) of both feet and painful thick toenails that are difficult to trim. Painful toenails interfere with ambulation. Aggravating factors include wearing enclosed shoe gear. Pain is relieved with periodic professional debridement. Painful calluses are aggravated when weightbearing with and without shoegear. Pain is relieved with periodic professional debridement.   New problem(s): Patient dropped an alcohol bottle on her foot about a month ago. Relates it has been very painful. Had x-rays that were negative. PCP concerned for infection and placed on antibiosis. Has been improving.   PCP is Etta Grandchild, MD.  No Known Allergies  Review of Systems: Negative except as noted in the HPI.  Objective: No changes noted in today's physical examination. There were no vitals filed for this visit. [name] is a pleasant 89 y.o. female frail, in NAD. AAO x 3.  Vascular Examination: CFT <3 seconds b/l. DP/PT pulses faintly palpable b/l. Skin temperature gradient warm to warm b/l. No pain with calf compression. No ischemia or gangrene. No cyanosis or clubbing noted b/l. Trace edema noted BLE.   Neurological Examination: Sensation grossly intact b/l with 10 gram monofilament. Vibratory sensation intact b/l.   Dermatological Examination: Pedal skin thin, shiny and atrophic b/l LE. Toenails 1-5 b/l elongated, discolored, dystrophic, thickened, crumbly with subungual debris and tenderness to dorsal palpation.   Hyperkeratotic lesion(s) plantar heel pad of right foot, R 2nd toe, submet head 5 right foot, and sub 5th met base right lower extremity.  No erythema, no edema, no drainage, no fluctuance.  Musculoskeletal Examination: Muscle strength 5/5 to b/l LE. HAV with bunion bilaterally and hammertoes 2-5 b/l. Plantar fat pad  atrophy b/l lower extremities. Utilizes wheelchair for mobility assistance.  Radiographs: None Assessment/Plan: 1. Pain due to onychomycosis of toenail   2. Corns and callosities   3. Clotting disorder (HCC)    -Hyperkeratotic tissue noted dorsual right second toe sub met head 5 right foot and right heel debrided without incident  -Did briefly discuss if right second toe continues to cause trouble one option would be amputation of the toe. Patient will consider  -Discussed left foot injury and contusion that likely needs a couple more months to heal. Discussed no current signs of infection noted.  -Mechanically debrided all nails 1-5 bilateral using sterile nail nipper and filed with dremel without incident  -Answered all patient questions -Patient to return  in 3 months for at risk foot care -Patient advised to call the office if any problems or questions arise in the meantime.   No follow-ups on file.  Louann Sjogren, DPM

## 2023-11-21 ENCOUNTER — Ambulatory Visit: Payer: Medicare Other | Admitting: Internal Medicine

## 2023-11-21 ENCOUNTER — Encounter: Payer: Self-pay | Admitting: Internal Medicine

## 2023-11-21 VITALS — BP 110/76 | HR 75 | Temp 98.4°F | Resp 16 | Ht <= 58 in | Wt 118.2 lb

## 2023-11-21 DIAGNOSIS — I87009 Postthrombotic syndrome without complications of unspecified extremity: Secondary | ICD-10-CM | POA: Insufficient documentation

## 2023-11-21 DIAGNOSIS — L03116 Cellulitis of left lower limb: Secondary | ICD-10-CM | POA: Diagnosis not present

## 2023-11-21 DIAGNOSIS — I872 Venous insufficiency (chronic) (peripheral): Secondary | ICD-10-CM | POA: Diagnosis not present

## 2023-11-21 MED ORDER — AMOXICILLIN-POT CLAVULANATE 875-125 MG PO TABS: 1.0000 | ORAL_TABLET | Freq: Two times a day (BID) | ORAL | 0 refills | Status: AC

## 2023-11-21 NOTE — Patient Instructions (Addendum)
STOP TAKING VERAPAMIL   Cellulitis, Adult  Cellulitis is a skin infection. The infected area is usually warm, red, swollen, and tender. It most commonly occurs on the lower body, such as the legs, feet, and toes, but this condition can occur on any part of the body. The infection can travel to the muscles, blood, and underlying tissue and become life-threatening without treatment. It is important to get medical treatment right away for this condition. What are the causes? Cellulitis is caused by bacteria. The bacteria enter through a break in the skin, such as a cut, burn, insect or animal bite, open sore, or crack. What increases the risk? This condition is more likely to occur in people who: Have a weak body's defense system (immune system). Are older than 87 years old. Have diabetes. Have a type of long-term (chronic) liver disease (cirrhosis) or kidney disease. Are obese. Have a skin condition such as: An itchy rash, such as eczema or psoriasis. A fungal rash on the feet or in skinfolds. Blistering rashes, such as shingles or chickenpox. Slow movement of blood in the veins (venous stasis). Fluid buildup below the skin (edema). Have open wounds on the skin, such as cuts, puncture wounds, burns, bites, scrapes, tattoos, piercings, or wounds from surgery. Have had radiation therapy. Use IV drugs. What are the signs or symptoms? Symptoms of this condition include: Skin that looks red, purple, or slightly darker than your usual skin color. Streaks or spots on the skin. Swollen area of the skin. Tenderness or pain when an area of the skin is touched. Warm skin. Fever or chills. Blisters. Tiredness (fatigue). How is this diagnosed? This condition is diagnosed based on a medical history and physical exam. You may also have tests, including: Blood tests. Imaging tests. Tests on a sample of fluid taken from the wound (wound culture). How is this treated? Treatment for this condition  may include: Medicines. These may include antibiotics or medicines to treat allergies (antihistamines). Rest. Applying cold or warm wet cloths (compresses) to the skin. If the condition is severe, you may need to stay in the hospital and get antibiotics through an IV. The infection usually starts to get better within 1-2 days of treatment. Follow these instructions at home: Medicines Take over-the-counter and prescription medicines only as told by your health care provider. If you were prescribed antibiotics, take them as told by your provider. Do not stop using the antibiotic even if you start to feel better. General instructions Drink enough fluid to keep your pee (urine) pale yellow. Do not touch or rub the infected area. Raise (elevate) the infected area above the level of your heart while you are sitting or lying down. Return to your normal activities as told by your provider. Ask your provider what activities are safe for you. Apply warm or cold compresses to the affected area as told by your provider. Keep all follow-up visits. Your provider will need to make sure that a more serious infection is not developing. Contact a health care provider if: You have a fever. Your symptoms do not improve within 1-2 days of starting treatment or you develop new symptoms. Your bone or joint underneath the infected area becomes painful after the skin has healed. Your infection returns in the same area or another area. Signs of this may include: You notice a swollen bump in the infected area. Your red area gets larger, turns dark in color, or becomes more painful. Drainage increases. Pus or a bad smell develops  in your infected area. You have more pain. You feel ill and have muscle aches and weakness. You develop vomiting or diarrhea that will not go away. Get help right away if: You notice red streaks coming from the infected area. You notice the skin turns purple or black and falls off. This  symptom may be an emergency. Get help right away. Call 911. Do not wait to see if the symptom will go away. Do not drive yourself to the hospital. This information is not intended to replace advice given to you by your health care provider. Make sure you discuss any questions you have with your health care provider. Document Revised: 08/08/2022 Document Reviewed: 08/08/2022 Elsevier Patient Education  2024 [employer].

## 2023-11-21 NOTE — Progress Notes (Signed)
Subjective:  Patient ID: [name], female    DOB: 1932-12-26  Age: 89 y.o. MRN: [id number]  CC: Recurrent Skin Infections   HPI [name] presents for f/up --        She continues to complain of pain, redness, and swelling over her left lower extremity.  She recently completed a course of doxycycline without much improvement in her symptoms.  Outpatient Medications Prior to Visit  Medication Sig Dispense Refill   Docusate Calcium (STOOL SOFTENER PO) Take by mouth as needed.     FEROSUL 325 (65 Fe) MG tablet TAKE 1 TABLET(325 MG) BY MOUTH DAILY WITH BREAKFAST 90 tablet 1   gabapentin (NEURONTIN) 400 MG capsule Take 1 capsule (400 mg total) by mouth at bedtime. (Patient taking differently: Take 400 mg by mouth as needed.) 90 capsule 1   Ketotifen Fumarate (ALAWAY OP) Apply to eye as needed (itchy eyes).     latanoprost (XALATAN) 0.005 % ophthalmic solution INT 1 GTT IN OD QD     metoprolol succinate (TOPROL-XL) 25 MG 24 hr tablet TAKE 1 TABLET(25 MG) BY MOUTH DAILY 90 tablet 1   Multiple Vitamin (MULTIVITAMIN) tablet Take 1 tablet by mouth daily.     Multiple Vitamins-Minerals (HAIR SKIN & NAILS ADVANCED) TABS Take 1 mg by mouth daily.     Olopatadine HCl (PATADAY OP) Apply 1 drop to eye daily.     pantoprazole (PROTONIX) 40 MG tablet TAKE 1 TABLET BY MOUTH 1 TO 2 TIMES DAILY 180 tablet 0   Polyethyl Glycol-Propyl Glycol (SYSTANE OP) Apply to eye daily. Left eye only     traMADol-acetaminophen (ULTRACET) 37.5-325 MG tablet Take 1 tablet by mouth every 8 (eight) hours as needed. 60 tablet 3   XARELTO 10 MG TABS tablet TAKE 1 TABLET(10 MG) BY MOUTH DAILY 90 tablet 0   verapamil (CALAN-SR) 240 MG CR tablet Take 1 tablet (240 mg total) by mouth daily. 90 tablet 1   doxycycline (VIBRA-TABS) 100 MG tablet Take 1 tablet (100 mg total) by mouth 2 (two) times daily. 20 tablet 0   famotidine (PEPCID) 20 MG tablet Take 1 tablet (20 mg total) by mouth 3 times/day as needed-between meals &  bedtime for heartburn or indigestion.     polyethylene glycol powder (GLYCOLAX/MIRALAX) 17 GM/SCOOP powder Dissolve 17 g as directed and drink daily. (Patient not taking: Reported on 11/06/2023) 238 g 0   No facility-administered medications prior to visit.    ROS Review of Systems  Constitutional:  Positive for fatigue. Negative for chills and fever.  HENT: Negative.    Eyes: Negative.   Respiratory: Negative.  Negative for cough, chest tightness, shortness of breath and wheezing.   Cardiovascular:  Positive for leg swelling. Negative for chest pain and palpitations.  Gastrointestinal:  Negative for abdominal pain, blood in stool, constipation, diarrhea and vomiting.  Endocrine: Negative.   Genitourinary: Negative.   Musculoskeletal:  Positive for gait problem. Negative for arthralgias and joint swelling.  Neurological:  Positive for dizziness, weakness and light-headedness.  Hematological:  Negative for adenopathy. Does not bruise/bleed easily.  Psychiatric/Behavioral:  Positive for confusion and decreased concentration. The patient is not nervous/anxious.     Objective:  BP 110/76 (BP Location: Left Arm, Patient Position: Sitting, Cuff Size: Normal)   Pulse 75   Temp 98.4 F (36.9 C) (Oral)   Resp 16   Ht 4\' 10"  (1.473 m)   Wt 118 lb 3.2 oz (53.6 kg)   SpO2 93%   BMI 24.70  kg/m   BP Readings from Last 3 Encounters:  11/21/23 110/76  11/06/23 (!) 118/58  10/19/23 (!) 160/83    Wt Readings from Last 3 Encounters:  11/21/23 118 lb 3.2 oz (53.6 kg)  05/10/23 116 lb (52.6 kg)  04/23/23 116 lb (52.6 kg)    Physical Exam Vitals reviewed.  Constitutional:      General: She is not in acute distress.    Appearance: She is ill-appearing (in a wheelchair). She is not toxic-appearing or diaphoretic.  HENT:     Mouth/Throat:     Mouth: Mucous membranes are moist.  Eyes:     General: No scleral icterus.    Conjunctiva/sclera: Conjunctivae normal.  Cardiovascular:      Rate and Rhythm: Normal rate and regular rhythm.     Heart sounds: No murmur heard.    No friction rub. Gallop present.     Comments: LLE - Medial area of warmth, erythema, and tenderness. No streaking.  Skin is intact with no induration, fluctuance, exudate. Pulmonary:     Effort: Pulmonary effort is normal.     Breath sounds: No stridor. No wheezing, rhonchi or rales.  Abdominal:     General: Abdomen is flat.     Palpations: There is no mass.     Tenderness: There is no abdominal tenderness. There is no guarding.     Hernia: No hernia is present.  Musculoskeletal:        General: Swelling present.     Cervical back: Neck supple.     Right lower leg: 1+ Edema present.     Left lower leg: 2+ Edema present.  Skin:    Findings: Erythema and rash present.  Neurological:     Mental Status: She is alert. Mental status is at baseline.  Psychiatric:        Mood and Affect: Mood normal.        Behavior: Behavior normal.     Lab Results  Component Value Date   WBC 5.4 09/03/2023   HGB 11.0 (L) 09/03/2023   HCT 34.6 (L) 09/03/2023   PLT 293.0 09/03/2023   GLUCOSE 72 09/03/2023   CHOL 145 09/03/2023   TRIG 88.0 09/03/2023   HDL 69.40 09/03/2023   LDLCALC 58 09/03/2023   ALT 13 04/23/2023   AST 15 04/23/2023   NA 145 09/03/2023   K 4.0 09/03/2023   CL 108 09/03/2023   CREATININE 0.81 09/03/2023   BUN 20 09/03/2023   CO2 31 09/03/2023   TSH 1.90 05/10/2023   INR 1.3 (H) 04/23/2023   HGBA1C 5.4 05/30/2017    DG Foot Complete Left  Result Date: 10/19/2023 CLINICAL DATA:  Pain in fourth proximal metatarsal after dropping bottle on foot. EXAM: LEFT FOOT - COMPLETE 3+ VIEW COMPARISON:  05/31/2015. FINDINGS: There is diffusely decreased mineralization of the bones. Hammertoe deformity is noted at the second and third digits. No acute fracture or dislocation is seen. There is hallux valgus deformity and degenerative changes at the first metatarsophalangeal joint. Degenerative  changes are present in the midfoot and hindfoot. Moderate calcaneal spurring. Vascular calcifications are present in the soft tissues. Diffuse soft tissue swelling is noted over the dorsum of the foot. IMPRESSION: 1. No acute fracture or dislocation. 2. Scattered degenerative changes. Electronically Signed   By: Thornell Sartorius M.D.   On: 10/19/2023 20:57    Assessment & Plan:  Post-phlebitic syndrome -     Ambulatory referral to Vascular Surgery  Venous insufficiency of lower extremity- Will  discontinue the CCB since this may be contributing to the edema. She may benefit from a compression device. -     Ambulatory referral to Vascular Surgery  Cellulitis of left lower leg- Will treat for strep and other non-staph organisms. -     Amoxicillin-Pot Clavulanate; Take 1 tablet by mouth 2 (two) times daily for 10 days.  Dispense: 20 tablet; Refill: 0     Follow-up: Return in about 3 months (around 02/21/2024).  Sanda Linger, MD

## 2023-12-14 ENCOUNTER — Ambulatory Visit (INDEPENDENT_AMBULATORY_CARE_PROVIDER_SITE_OTHER): Payer: Medicare Other

## 2023-12-14 ENCOUNTER — Encounter (INDEPENDENT_AMBULATORY_CARE_PROVIDER_SITE_OTHER): Payer: Self-pay

## 2023-12-14 VITALS — Wt 124.0 lb

## 2023-12-14 DIAGNOSIS — H6123 Impacted cerumen, bilateral: Secondary | ICD-10-CM | POA: Diagnosis not present

## 2023-12-14 DIAGNOSIS — H9041 Sensorineural hearing loss, unilateral, right ear, with unrestricted hearing on the contralateral side: Secondary | ICD-10-CM

## 2023-12-17 DIAGNOSIS — H9041 Sensorineural hearing loss, unilateral, right ear, with unrestricted hearing on the contralateral side: Secondary | ICD-10-CM | POA: Insufficient documentation

## 2023-12-17 DIAGNOSIS — H6123 Impacted cerumen, bilateral: Secondary | ICD-10-CM | POA: Insufficient documentation

## 2023-12-17 NOTE — Progress Notes (Signed)
Patient ID: [name], female   DOB: 1933-03-23, 89 y.o.   MRN: [id number]  Follow-up: Asymmetric right ear hearing loss  HPI: The patient is an 89 year old female who returns today for her follow-up evaluation.  The patient was last seen 6 months ago for her hearing loss.  She was noted to have bilateral cerumen impaction.  She was also noted to have bilateral high-frequency sensorineural hearing loss, worse on the right side.  The small possibility of a retrocochlear lesion causing her asymmetric hearing loss was discussed.  The patient elected to proceed with conservative observation instead of MRI scan.  She returns today reporting no change in her hearing.  She has noted increasing clogging sensation in her ears.  She denies any otalgia, otorrhea, or vertigo.  Exam: General: Communicates with difficulty, well nourished, no acute distress. Head: Normocephalic, no evidence injury, no tenderness, facial buttresses intact without stepoff. Face/sinus: No tenderness to palpation and percussion. Facial movement is normal and symmetric. Eyes: PERRL, EOMI. No scleral icterus, conjunctivae clear. Neuro: CN II exam reveals vision grossly intact.  No nystagmus at any point of gaze. EAC: Bilateral cerumen impaction.  Under the operating microscope, the cerumen is carefully removed with a combination of cerumen currette, alligator forceps, and suction catheters.  After the cerumen is removed, the TMs are noted to be normal. Nose: External evaluation reveals normal support and skin without lesions.  Dorsum is intact.  Anterior rhinoscopy reveals pink mucosa over anterior aspect of inferior turbinates and intact septum.  No purulence noted. Oral:  Oral cavity and oropharynx are intact, symmetric, without erythema or edema.  Mucosa is moist without lesions. Neck: Full range of motion without pain.  There is no significant lymphadenopathy.  No masses palpable.  Thyroid bed within normal limits to palpation.  Parotid  glands and submandibular glands equal bilaterally without mass.  Trachea is midline. Neuro:  CN 2-12 grossly intact. Gait unsteady.   Procedure: Bilateral cerumen disimpaction Anesthesia: None Description: Under the operating microscope, the cerumen is carefully removed with a combination of cerumen currette, alligator forceps, and suction catheters.  After the cerumen is removed, the TMs are noted to be normal.  No mass, erythema, or lesions. The patient tolerated the procedure well.   The patient defers on hearing test today.  Assessment 1.  Bilateral recurrent cerumen impaction.  After the disimpaction procedure, both tympanic membranes and middle ear spaces are noted to be normal. 2.  Subjectively stable blateral high-frequency sensorineural hearing loss, worse on the right side.  Plan  1.  Otomicroscopy with bilateral cerumen disimpaction. 2.  The physical exam findings are reviewed with the patient. 3.  The small possibility of a retrocochlear lesion causing her asymmetric hearing loss is discussed.  The options of conservative observation versus MRI scan are reviewed.  The decision is made to continue with conservative observation for now. 4.  The patient is a candidate for hearing amplification.  5.  The patient will return for reevaluation in 6 months.

## 2023-12-26 ENCOUNTER — Other Ambulatory Visit: Payer: Self-pay | Admitting: Internal Medicine

## 2023-12-26 DIAGNOSIS — K219 Gastro-esophageal reflux disease without esophagitis: Secondary | ICD-10-CM

## 2024-01-09 ENCOUNTER — Ambulatory Visit: Payer: Medicare Other | Admitting: Internal Medicine

## 2024-01-14 ENCOUNTER — Other Ambulatory Visit: Payer: Self-pay

## 2024-01-14 DIAGNOSIS — I872 Venous insufficiency (chronic) (peripheral): Secondary | ICD-10-CM

## 2024-01-23 ENCOUNTER — Other Ambulatory Visit: Payer: Self-pay | Admitting: Internal Medicine

## 2024-01-23 DIAGNOSIS — M51369 Other intervertebral disc degeneration, lumbar region without mention of lumbar back pain or lower extremity pain: Secondary | ICD-10-CM

## 2024-01-23 DIAGNOSIS — M479 Spondylosis, unspecified: Secondary | ICD-10-CM

## 2024-01-23 NOTE — Telephone Encounter (Unsigned)
Copied from CRM [id number]. Topic: Clinical - Medication Refill >> Jan 23, 2024  2:15 PM Truddie Crumble wrote: Most Recent Primary Care Visit:  Provider: Etta Grandchild  Department: [facility]  Visit Type: OFFICE VISIT  Date: 11/21/2023  Medication: traMADol-acetaminophen  Has the patient contacted their pharmacy? Yes (Agent: If no, request that the patient contact the pharmacy for the refill. If patient does not wish to contact the pharmacy document the reason why and proceed with request.) (Agent: If yes, when and what did the pharmacy advise?)  Is this the correct pharmacy for this prescription? Yes If no, delete pharmacy and type the correct one.  This is the patient's preferred pharmacy:  [facility] [phone] - [place], [place] - [address] AT [facility] [address] [place] [address] Phone: [phone] Fax: [phone]   Has the prescription been filled recently? Yes  Is the patient out of the medication? Yes  Has the patient been seen for an appointment in the last year OR does the patient have an upcoming appointment? Yes  Can we respond through MyChart? Yes  Agent: Please be advised that Rx refills may take up to 3 business days. We ask that you follow-up with your pharmacy.

## 2024-01-23 NOTE — Telephone Encounter (Signed)
Copied from CRM [id number]. Topic: Clinical - Medication Refill >> Jan 23, 2024  2:15 PM Truddie Crumble wrote: Most Recent Primary Care Visit:  Provider: Etta Grandchild  Department: [facility]  Visit Type: OFFICE VISIT  Date: 11/21/2023  Medication: traMADol-acetaminophen  Has the patient contacted their pharmacy? Yes (Agent: If no, request that the patient contact the pharmacy for the refill. If patient does not wish to contact the pharmacy document the reason why and proceed with request.) (Agent: If yes, when and what did the pharmacy advise?)  Is this the correct pharmacy for this prescription? Yes If no, delete pharmacy and type the correct one.  This is the patient's preferred pharmacy:  [facility] [phone] - [place], [place] - [address] AT [facility] [address] [place] [address] Phone: [phone] Fax: [phone]   Has the prescription been filled recently? Yes  Is the patient out of the medication? Yes  Has the patient been seen for an appointment in the last year OR does the patient have an upcoming appointment? Yes  Can we respond through MyChart? Yes  Agent: Please be advised that Rx refills may take up to 3 business days. We ask that you follow-up with your pharmacy.

## 2024-01-25 ENCOUNTER — Ambulatory Visit (INDEPENDENT_AMBULATORY_CARE_PROVIDER_SITE_OTHER): Payer: Medicare Other | Admitting: Physician Assistant

## 2024-01-25 ENCOUNTER — Ambulatory Visit (HOSPITAL_COMMUNITY)
Admission: RE | Admit: 2024-01-25 | Discharge: 2024-01-25 | Disposition: A | Discharge status: Home / Self Care | Payer: Medicare Other | Source: Ambulatory Visit | Attending: Vascular Surgery | Admitting: Vascular Surgery

## 2024-01-25 VITALS — BP 144/75 | HR 75 | Temp 98.3°F | Resp 16 | Ht <= 58 in | Wt 124.0 lb

## 2024-01-25 DIAGNOSIS — I872 Venous insufficiency (chronic) (peripheral): Secondary | ICD-10-CM | POA: Diagnosis not present

## 2024-01-25 DIAGNOSIS — L0390 Cellulitis, unspecified: Secondary | ICD-10-CM | POA: Diagnosis not present

## 2024-01-25 DIAGNOSIS — M7989 Other specified soft tissue disorders: Secondary | ICD-10-CM

## 2024-01-25 NOTE — Progress Notes (Signed)
[facility]    Reason for referral: Swollen left > right leg  History of Present Illness  [name] is a 89 y.o. female who presents with chief complaint: swollen leg.  Patient notes, onset of swelling many months ago, associated with left LE cellulitis.  The patient has had a history of DVT, no history of varicose vein, no history of venous stasis ulcers, no history of  Lymphedema and positive history of skin changes in lower legs cellulitis.  There is unknown family history of venous disorders.  The patient has  used compression stockings in the past.   She continues to complain of pain, redness, and swelling over her left lower extremity. She recently completed a course of doxycycline without much improvement in her symptoms. She continues to live alone with [facility] aide and family to help her.  She spends most of her time seated in a dependent position.  She is ambulatory with a "plunger " at home for short distances and uses a Rolator for going out of the house.    Her niece is with her today.  They would like to know if her veins and arteries are OK and how to prevent the cellulitis and edema from occurring.  She does have a history of right lower leg DVT and previous DVT that is managed with Xarelto daily.  The cellulitis is on the left LE and she has no history of left leg DVT.      Past Medical History:  Diagnosis Date   Abnormal finding on Pap smear, ASCUS    Acute renal insufficiency    Amputation finger    index   Barrett's esophagus    CAD (coronary artery disease)    Dementia (HCC)    Diverticulosis    Elevated BP    Foot deformity    GERD (gastroesophageal reflux disease)    Glaucoma    H/O: GI bleed    HSV-2 (herpes simplex virus 2) infection    Hyperglycemia    Hypertension    Migraine    Osteoporosis    worsening   Spondylosis     Past Surgical History:  Procedure Laterality Date   AMPUTATION FINGER / THUMB     secondary to  osteomyelitis   BIOPSY  04/24/2023   Procedure: BIOPSY;  Surgeon: Shellia Cleverly, DO;  Location: [facility] ENDOSCOPY;  Service: Gastroenterology;;   CATARACT EXTRACTION, BILATERAL Bilateral 12/25/2016   COLONOSCOPY     ESOPHAGOGASTRODUODENOSCOPY N/A 10/22/2016   Procedure: ESOPHAGOGASTRODUODENOSCOPY (EGD);  Surgeon: Sherrilyn Rist, MD;  Location: [facility] ENDOSCOPY;  Service: Endoscopy;  Laterality: N/A;   ESOPHAGOGASTRODUODENOSCOPY N/A 04/24/2023   Procedure: ESOPHAGOGASTRODUODENOSCOPY (EGD);  Surgeon: Shellia Cleverly, DO;  Location: [facility] ENDOSCOPY;  Service: Gastroenterology;  Laterality: N/A;   FOOT SURGERY     right   PARTIAL HYSTERECTOMY     UPPER GASTROINTESTINAL ENDOSCOPY      Social History   Socioeconomic History   Marital status: Single    Spouse name: Not on file   Number of children: 1   Years of education: Not on file   Highest education level: 12th grade  Occupational History    Employer: RETIRED  Tobacco Use   Smoking status: Never   Smokeless tobacco: Never  Vaping Use   Vaping status: Never Used  Substance and Sexual Activity   Alcohol use: No    Alcohol/week: 0.0 standard drinks of alcohol   Drug use: No   Sexual activity: Never  Birth control/protection: Post-menopausal  Other Topics Concern   Not on file  Social History Narrative   Marital status: divorced.      Children: 1 son; no grandchildren      Lives: with son      Employment: retired      Tobacco: none      Alcohol: none      Drugs: none       Exercise: none       ADLs:  Independent with ADLs.  Quit driving age 83.  Medical CNA cleans house; makes breakfast; patient pays bills.      Advanced Directives:   YES.  HCPOA:  Son/Isaac Bowman.  FULL CODE; no prolonged measures.   Social Drivers of Corporate investment banker Strain: Low Risk  (05/28/2023)   Overall Financial Resource Strain (CARDIA)    Difficulty of Paying Living Expenses: Not hard at all  Food Insecurity: No Food Insecurity  (05/28/2023)   Hunger Vital Sign    Worried About Running Out of Food in the Last Year: Never true    Ran Out of Food in the Last Year: Never true  Transportation Needs: No Transportation Needs (05/28/2023)   PRAPARE - Administrator, Civil Service (Medical): No    Lack of Transportation (Non-Medical): No  Physical Activity: Sufficiently Active (05/28/2023)   Exercise Vital Sign    Days of Exercise per Week: 7 days    Minutes of Exercise per Session: 30 min  Stress: No Stress Concern Present (05/28/2023)   [employer] of Occupational Health - Occupational Stress Questionnaire    Feeling of Stress : Not at all  Social Connections: Moderately Integrated (05/28/2023)   Social Connection and Isolation Panel [NHANES]    Frequency of Communication with Friends and Family: Three times a week    Frequency of Social Gatherings with Friends and Family: More than three times a week    Attends Religious Services: 1 to 4 times per year    Active Member of [employer] or Organizations: Yes    Attends Banker Meetings: 1 to 4 times per year    Marital Status: Never married  Intimate Partner Violence: Not At Risk (05/28/2023)   Humiliation, Afraid, Rape, and Kick questionnaire    Fear of Current or Ex-Partner: No    Emotionally Abused: No    Physically Abused: No    Sexually Abused: No    Family History  Problem Relation Age of Onset   Breast cancer Mother    Heart attack Mother    Heart disease Father    Alcohol abuse Son    Colon cancer Neg Hx    Stomach cancer Neg Hx    Rectal cancer Neg Hx     Current Outpatient Medications on File Prior to Visit  Medication Sig Dispense Refill   Docusate Calcium (STOOL SOFTENER PO) Take by mouth as needed.     FEROSUL 325 (65 Fe) MG tablet TAKE 1 TABLET(325 MG) BY MOUTH DAILY WITH BREAKFAST 90 tablet 1   gabapentin (NEURONTIN) 400 MG capsule Take 1 capsule (400 mg total) by mouth at bedtime. (Patient taking differently: Take 400 mg by  mouth as needed.) 90 capsule 1   Ketotifen Fumarate (ALAWAY OP) Apply to eye as needed (itchy eyes).     latanoprost (XALATAN) 0.005 % ophthalmic solution INT 1 GTT IN OD QD     metoprolol succinate (TOPROL-XL) 25 MG 24 hr tablet TAKE 1 TABLET(25 MG) BY MOUTH DAILY  90 tablet 1   Multiple Vitamin (MULTIVITAMIN) tablet Take 1 tablet by mouth daily.     Multiple Vitamins-Minerals (HAIR SKIN & NAILS ADVANCED) TABS Take 1 mg by mouth daily.     Olopatadine HCl (PATADAY OP) Apply 1 drop to eye daily.     pantoprazole (PROTONIX) 40 MG tablet TAKE 1 TABLET BY MOUTH 1 TO 2 TIMES DAILY 180 tablet 0   Polyethyl Glycol-Propyl Glycol (SYSTANE OP) Apply to eye daily. Left eye only     traMADol-acetaminophen (ULTRACET) 37.5-325 MG tablet TAKE 1 TABLET BY MOUTH EVERY 8 HOURS AS NEEDED 60 tablet 0   XARELTO 10 MG TABS tablet TAKE 1 TABLET(10 MG) BY MOUTH DAILY 90 tablet 0   No current facility-administered medications on file prior to visit.    Allergies as of 01/25/2024   (No Known Allergies)     ROS:   General:  No weight loss, Fever, chills  HEENT: No recent headaches, no nasal bleeding, no visual changes, no sore throat  Neurologic: No dizziness, blackouts, seizures. No recent symptoms of stroke or mini- stroke. No recent episodes of slurred speech, or temporary blindness.  Cardiac: No recent episodes of chest pain/pressure, no shortness of breath at rest.  No shortness of breath with exertion.  Denies history of atrial fibrillation or irregular heartbeat  Vascular: No history of rest pain in feet.  No history of claudication.  No history of non-healing ulcer, No history of DVT   Pulmonary: No home oxygen, no productive cough, no hemoptysis,  No asthma or wheezing  Musculoskeletal:  [ ]  Arthritis, [ ]  Low back pain,  [ ]  Joint pain  Hematologic:No history of hypercoagulable state.  No history of easy bleeding.  No history of anemia  Gastrointestinal: No hematochezia or melena,  No  gastroesophageal reflux, no trouble swallowing  Urinary: [ ]  chronic Kidney disease, [ ]  on HD - [ ]  MWF or [ ]  TTHS, [ ]  Burning with urination, [ ]  Frequent urination, [ ]  Difficulty urinating;   Skin: No rashes  Psychological: No history of anxiety,  No history of depression  Physical Examination  Vitals:   01/25/24 1301  BP: (!) 144/75  Pulse: 75  Resp: 16  Temp: 98.3 F (36.8 C)  SpO2: 98%  Weight: 124 lb (56.2 kg)  Height: 4\' 10"  (1.473 m)    Body mass index is 25.92 kg/m.  General:  Alert and oriented, no acute distress HEENT: Normal Neck: No bruit or JVD Pulmonary: Clear to auscultation bilaterally Cardiac: Regular Rate and Rhythm without murmur Abdomen: Soft, non-tender, non-distended, no mass, no scars Skin: No rash Extremity Pulses:   radial,dorsalis pedis pulses bilaterally Musculoskeletal: No deformity or edema  Neurologic: Upper and lower extremity motor grossly intact and symmetric  DATA:  +--------------+---------+------+-----------+------------+-------------+  LEFT         Reflux NoRefluxReflux TimeDiameter cmsComments                               Yes                                        +--------------+---------+------+-----------+------------+-------------+  CFV          no                                                   +--------------+---------+------+-----------+------------+-------------+  FV mid        no                                                   +--------------+---------+------+-----------+------------+-------------+  Popliteal    no                                                   +--------------+---------+------+-----------+------------+-------------+  GSV at [facility]    no                           0.549                   +--------------+---------+------+-----------+------------+-------------+  GSV prox thighno                            .323                    +--------------+---------+------+-----------+------------+-------------+  GSV mid thigh                               .330                   +--------------+---------+------+-----------+------------+-------------+  GSV dist thighno                            .225    out of fascia  +--------------+---------+------+-----------+------------+-------------+  GSV at knee                                 .183    out of fascia  +--------------+---------+------+-----------+------------+-------------+  GSV prox calf no                            .225    out of fascia  +--------------+---------+------+-----------+------------+-------------+  SSV Pop Fossa no                            .175                   +--------------+---------+------+-----------+------------+-------------+  SSV prox calf no                            .191                   +--------------+---------+------+-----------+------------+-------------+     Summary:  Left:  - No evidence of deep vein thrombosis seen in the left lower extremity,  from the common femoral through the popliteal veins.  - No evidence of superficial venous thrombosis in the left lower  extremity.  - There is no evidence of venous reflux seen in the left lower extremity.    Assessment/Plan: Dependent sitting/ sedentary life style due to aging.   No reflux or DVT was demonstrated on duplex today.  I discussed conservative treatment with elevation in the supine  position, leg exercises, ambulation and she was fitted with knee high mild compression to be worn daily.  They even have plans to get her a recliner.  She has palpable pedal pulse B DP and is not at risk of limb loss.  Hopefully this plan will decrease the cellulitis events and manage the LE swelling.  Her niece will try and help remind her of the conservative plan as well as review the vein handout they were given today.  Her PCP can continue to manage her  occasional cellulitis.    F/U PRN  Mosetta Pigeon PA-C [facility] [place] Office: [phone]  MD on call Randie Heinz

## 2024-01-31 ENCOUNTER — Other Ambulatory Visit: Payer: Self-pay | Admitting: Internal Medicine

## 2024-01-31 DIAGNOSIS — I2510 Atherosclerotic heart disease of native coronary artery without angina pectoris: Secondary | ICD-10-CM

## 2024-01-31 DIAGNOSIS — D6869 Other thrombophilia: Secondary | ICD-10-CM

## 2024-01-31 DIAGNOSIS — I82511 Chronic embolism and thrombosis of right femoral vein: Secondary | ICD-10-CM

## 2024-01-31 DIAGNOSIS — I10 Essential (primary) hypertension: Secondary | ICD-10-CM

## 2024-01-31 MED ORDER — RIVAROXABAN 10 MG PO TABS: 10.0000 mg | ORAL_TABLET | Freq: Every day | ORAL | 0 refills | Status: DC

## 2024-02-01 ENCOUNTER — Encounter: Payer: Self-pay | Admitting: Internal Medicine

## 2024-02-01 ENCOUNTER — Ambulatory Visit (INDEPENDENT_AMBULATORY_CARE_PROVIDER_SITE_OTHER): Payer: Medicare Other | Admitting: Internal Medicine

## 2024-02-01 VITALS — BP 120/76 | HR 73 | Temp 98.3°F | Ht <= 58 in

## 2024-02-01 DIAGNOSIS — R739 Hyperglycemia, unspecified: Secondary | ICD-10-CM | POA: Diagnosis not present

## 2024-02-01 DIAGNOSIS — I10 Essential (primary) hypertension: Secondary | ICD-10-CM | POA: Diagnosis not present

## 2024-02-01 DIAGNOSIS — L8991 Pressure ulcer of unspecified site, stage 1: Secondary | ICD-10-CM

## 2024-02-01 MED ORDER — DUODERM CGF DRESSING EX MISC: 20.0000 | Freq: Every day | CUTANEOUS | 5 refills | Status: DC | PRN

## 2024-02-01 NOTE — Patient Instructions (Signed)
 Please take all new medication as prescribed - the duoderm as needed  Please continue all other medications as before, and refills have been done if requested.  Please have the pharmacy call with any other refills you may need.  Please keep your appointments with your specialists as you may have planned

## 2024-02-01 NOTE — Progress Notes (Signed)
 Patient ID: [name], female   DOB: [birth year], 89 y.o.   MRN: [id number]        Chief Complaint: follow up right buttock pressure sore, htn, hyperglycemia       HPI:  [name] is a 89 y.o. female here with new onset 1 wk right buttock very shallow pressure sore to right buttock,  Pt denies chest pain, increased sob or doe, wheezing, orthopnea, PND, increased LE swelling, palpitations, dizziness or syncope.   Pt denies polydipsia, polyuria, or new focal neuro s/s.    Pt denies fever, wt loss, night sweats, loss of appetite, or other constitutional symptoms        Wt Readings from Last 3 Encounters:  01/25/24 124 lb (56.2 kg)  12/14/23 124 lb (56.2 kg)  11/21/23 118 lb 3.2 oz (53.6 kg)   BP Readings from Last 3 Encounters:  02/01/24 120/76  01/25/24 (!) 144/75  11/21/23 110/76         Past Medical History:  Diagnosis Date   Abnormal finding on Pap smear, ASCUS    Acute renal insufficiency    Amputation finger    index   Barrett's esophagus    CAD (coronary artery disease)    Dementia (HCC)    Diverticulosis    Elevated BP    Foot deformity    GERD (gastroesophageal reflux disease)    Glaucoma    H/O: GI bleed    HSV-2 (herpes simplex virus 2) infection    Hyperglycemia    Hypertension    Migraine    Osteoporosis    worsening   Spondylosis    Past Surgical History:  Procedure Laterality Date   AMPUTATION FINGER / THUMB     secondary to osteomyelitis   BIOPSY  04/24/2023   Procedure: BIOPSY;  Surgeon: San Sandor GAILS, DO;  Location: [facility] ENDOSCOPY;  Service: Gastroenterology;;   CATARACT EXTRACTION, BILATERAL Bilateral 12/25/2016   COLONOSCOPY     ESOPHAGOGASTRODUODENOSCOPY N/A 10/22/2016   Procedure: ESOPHAGOGASTRODUODENOSCOPY (EGD);  Surgeon: Victory LITTIE Legrand DOUGLAS, MD;  Location: [facility] ENDOSCOPY;  Service: Endoscopy;  Laterality: N/A;   ESOPHAGOGASTRODUODENOSCOPY N/A 04/24/2023   Procedure: ESOPHAGOGASTRODUODENOSCOPY (EGD);  Surgeon: San Sandor GAILS, DO;  Location:  [facility] ENDOSCOPY;  Service: Gastroenterology;  Laterality: N/A;   FOOT SURGERY     right   PARTIAL HYSTERECTOMY     UPPER GASTROINTESTINAL ENDOSCOPY      reports that she has never smoked. She has never used smokeless tobacco. She reports that she does not drink alcohol  and does not use drugs. family history includes Alcohol  abuse in her son; Breast cancer in her mother; Heart attack in her mother; Heart disease in her father. No Known Allergies Current Outpatient Medications on File Prior to Visit  Medication Sig Dispense Refill   Docusate Calcium (STOOL SOFTENER PO) Take by mouth as needed.     FEROSUL 325 (65 Fe) MG tablet TAKE 1 TABLET(325 MG) BY MOUTH DAILY WITH BREAKFAST 90 tablet 1   gabapentin  (NEURONTIN ) 400 MG capsule Take 1 capsule (400 mg total) by mouth at bedtime. (Patient taking differently: Take 400 mg by mouth as needed.) 90 capsule 1   Ketotifen Fumarate (ALAWAY OP) Apply to eye as needed (itchy eyes).     latanoprost  (XALATAN ) 0.005 % ophthalmic solution INT 1 GTT IN OD QD     metoprolol  succinate (TOPROL -XL) 25 MG 24 hr tablet TAKE 1 TABLET(25 MG) BY MOUTH DAILY 90 tablet 1   Multiple Vitamin (MULTIVITAMIN) tablet Take 1 tablet  by mouth daily.     Multiple Vitamins-Minerals (HAIR SKIN & NAILS ADVANCED) TABS Take 1 mg by mouth daily.     Olopatadine HCl (PATADAY OP) Apply 1 drop to eye daily.     pantoprazole  (PROTONIX ) 40 MG tablet TAKE 1 TABLET BY MOUTH 1 TO 2 TIMES DAILY 180 tablet 0   Polyethyl Glycol-Propyl Glycol (SYSTANE OP) Apply to eye daily. Left eye only     rivaroxaban  (XARELTO ) 10 MG TABS tablet Take 1 tablet (10 mg total) by mouth daily. 90 tablet 0   traMADol -acetaminophen  (ULTRACET ) 37.5-325 MG tablet TAKE 1 TABLET BY MOUTH EVERY 8 HOURS AS NEEDED 60 tablet 0   No current facility-administered medications on file prior to visit.        ROS:  All others reviewed and negative.  Objective        PE:  BP 120/76 (BP Location: Left Arm, Patient Position:  Sitting, Cuff Size: Normal)   Pulse 73   Temp 98.3 F (36.8 C) (Oral)   Ht 4' 10 (1.473 m)   SpO2 97%   BMI 25.92 kg/m                 Constitutional: Pt appears in NAD               HENT: Head: NCAT.                Right Ear: External ear normal.                 Left Ear: External ear normal.                Eyes: . Pupils are equal, round, and reactive to light. Conjunctivae and EOM are normal               Nose: without d/c or deformity               Neck: Neck supple. Gross normal ROM               Cardiovascular: Normal rate and regular rhythm.                 Pulmonary/Chest: Effort normal and breath sounds without rales or wheezing.                              Neurological: Pt is alert. At baseline orientation, motor grossly intact               Skin: Skin is warm. LE edema - none; right buttock with 1 cm area mild erythema and slight dermal loss               Psychiatric: Pt behavior is normal without agitation   Micro: none  Cardiac tracings I have personally interpreted today:  none  Pertinent Radiological findings (summarize): none   Lab Results  Component Value Date   WBC 5.4 09/03/2023   HGB 11.0 (L) 09/03/2023   HCT 34.6 (L) 09/03/2023   PLT 293.0 09/03/2023   GLUCOSE 72 09/03/2023   CHOL 145 09/03/2023   TRIG 88.0 09/03/2023   HDL 69.40 09/03/2023   LDLCALC 58 09/03/2023   ALT 13 04/23/2023   AST 15 04/23/2023   NA 145 09/03/2023   K 4.0 09/03/2023   CL 108 09/03/2023   CREATININE 0.81 09/03/2023   BUN 20 09/03/2023   CO2 31 09/03/2023   TSH 1.90 05/10/2023  INR 1.3 (H) 04/23/2023   HGBA1C 5.4 05/30/2017   Assessment/Plan:  [name] is a 89 y.o. Black or African American [2] female with  has a past medical history of Abnormal finding on Pap smear, ASCUS, Acute renal insufficiency, Amputation finger, Barrett's esophagus, CAD (coronary artery disease), Dementia (HCC), Diverticulosis, Elevated BP, Foot deformity, GERD (gastroesophageal reflux  disease), Glaucoma, H/O: GI bleed, HSV-2 (herpes simplex virus 2) infection, Hyperglycemia, Hypertension, Migraine, Osteoporosis, and Spondylosis.  Essential hypertension BP Readings from Last 3 Encounters:  02/01/24 120/76  01/25/24 (!) 144/75  11/21/23 110/76   Stable, pt to continue medical treatment toprol  xl 25 qd   Hyperglycemia Lab Results  Component Value Date   HGBA1C 5.4 05/30/2017   Stable, pt to continue current medical treatment  - diet,w t control   Pressure sore Very mild, for duoderm asd,  to f/u any worsening symptoms or concerns  Followup: Return if symptoms worsen or fail to improve.  Lynwood Rush, MD 02/03/2024 5:26 PM [facility]

## 2024-02-03 ENCOUNTER — Encounter: Payer: Self-pay | Admitting: Internal Medicine

## 2024-02-03 DIAGNOSIS — L8990 Pressure ulcer of unspecified site, unspecified stage: Secondary | ICD-10-CM | POA: Insufficient documentation

## 2024-02-03 NOTE — Assessment & Plan Note (Signed)
 BP Readings from Last 3 Encounters:  02/01/24 120/76  01/25/24 (!) 144/75  11/21/23 110/76   Stable, pt to continue medical treatment toprol  xl 25 qd

## 2024-02-03 NOTE — Assessment & Plan Note (Signed)
 Lab Results  Component Value Date   HGBA1C 5.4 05/30/2017   Stable, pt to continue current medical treatment  - diet, wt control

## 2024-02-03 NOTE — Assessment & Plan Note (Signed)
 Very mild, for duoderm asd,  to f/u any worsening symptoms or concerns

## 2024-02-20 ENCOUNTER — Ambulatory Visit (INDEPENDENT_AMBULATORY_CARE_PROVIDER_SITE_OTHER): Payer: Medicare Other | Admitting: Podiatry

## 2024-02-20 ENCOUNTER — Encounter: Payer: Self-pay | Admitting: Podiatry

## 2024-02-20 VITALS — Ht <= 58 in | Wt 124.0 lb

## 2024-02-20 DIAGNOSIS — D689 Coagulation defect, unspecified: Secondary | ICD-10-CM

## 2024-02-20 DIAGNOSIS — B351 Tinea unguium: Secondary | ICD-10-CM

## 2024-02-20 DIAGNOSIS — L84 Corns and callosities: Secondary | ICD-10-CM | POA: Diagnosis not present

## 2024-02-20 DIAGNOSIS — M79676 Pain in unspecified toe(s): Secondary | ICD-10-CM

## 2024-02-21 ENCOUNTER — Ambulatory Visit: Payer: Medicare Other | Admitting: Internal Medicine

## 2024-02-21 ENCOUNTER — Encounter: Payer: Self-pay | Admitting: Internal Medicine

## 2024-02-21 VITALS — BP 132/62 | HR 86 | Temp 97.9°F | Ht <= 58 in

## 2024-02-21 DIAGNOSIS — I10 Essential (primary) hypertension: Secondary | ICD-10-CM

## 2024-02-21 DIAGNOSIS — D508 Other iron deficiency anemias: Secondary | ICD-10-CM | POA: Diagnosis not present

## 2024-02-21 DIAGNOSIS — D6869 Other thrombophilia: Secondary | ICD-10-CM | POA: Diagnosis not present

## 2024-02-21 DIAGNOSIS — M10371 Gout due to renal impairment, right ankle and foot: Secondary | ICD-10-CM | POA: Diagnosis not present

## 2024-02-21 DIAGNOSIS — N1832 Chronic kidney disease, stage 3b: Secondary | ICD-10-CM

## 2024-02-21 LAB — IBC + FERRITIN
Ferritin: 31.4 ng/mL (ref 10.0–291.0)
Iron: 25 ug/dL — ABNORMAL LOW (ref 42–145)
Saturation Ratios: 8.8 % — ABNORMAL LOW (ref 20.0–50.0)
TIBC: 285.6 ug/dL (ref 250.0–450.0)
Transferrin: 204 mg/dL — ABNORMAL LOW (ref 212.0–360.0)

## 2024-02-21 LAB — BASIC METABOLIC PANEL
BUN: 13 mg/dL (ref 6–23)
CO2: 31 meq/L (ref 19–32)
Calcium: 9.3 mg/dL (ref 8.4–10.5)
Chloride: 105 meq/L (ref 96–112)
Creatinine, Ser: 0.8 mg/dL (ref 0.40–1.20)
GFR: 65.78 mL/min (ref >60.00)
Glucose, Bld: 91 mg/dL (ref 70–99)
Potassium: 4.1 meq/L (ref 3.5–5.1)
Sodium: 141 meq/L (ref 135–145)

## 2024-02-21 LAB — URIC ACID: Uric Acid, Serum: 4.8 mg/dL (ref 2.4–7.0)

## 2024-02-21 LAB — CBC WITH DIFFERENTIAL/PLATELET
Basophils Absolute: 0.1 10*3/uL (ref 0.0–0.1)
Basophils Relative: 0.7 % (ref 0.0–3.0)
Eosinophils Absolute: 0.1 10*3/uL (ref 0.0–0.7)
Eosinophils Relative: 1.1 % (ref 0.0–5.0)
HCT: 31.3 % — ABNORMAL LOW (ref 36.0–46.0)
Hemoglobin: 10.3 g/dL — ABNORMAL LOW (ref 12.0–15.0)
Lymphocytes Relative: 8.9 % — ABNORMAL LOW (ref 12.0–46.0)
Lymphs Abs: 0.6 10*3/uL — ABNORMAL LOW (ref 0.7–4.0)
MCHC: 32.9 g/dL (ref 30.0–36.0)
MCV: 86.7 fL (ref 78.0–100.0)
Monocytes Absolute: 0.8 10*3/uL (ref 0.1–1.0)
Monocytes Relative: 10.8 % (ref 3.0–12.0)
Neutro Abs: 5.6 10*3/uL (ref 1.4–7.7)
Neutrophils Relative %: 78.5 % — ABNORMAL HIGH (ref 43.0–77.0)
Platelets: 346 10*3/uL (ref 150.0–400.0)
RBC: 3.61 Mil/uL — ABNORMAL LOW (ref 3.87–5.11)
RDW: 15.1 % (ref 11.5–15.5)
WBC: 7.2 10*3/uL (ref 4.0–10.5)

## 2024-02-21 MED ORDER — METHYLPREDNISOLONE 4 MG PO TBPK: ORAL_TABLET | ORAL | 0 refills | Status: AC

## 2024-02-21 MED ORDER — ACCRUFER 30 MG PO CAPS: 1.0000 | ORAL_CAPSULE | Freq: Two times a day (BID) | ORAL | 0 refills | Status: DC

## 2024-02-21 MED ORDER — COLCHICINE 0.6 MG PO TABS: 0.6000 mg | ORAL_TABLET | Freq: Every day | ORAL | 0 refills | Status: DC

## 2024-02-21 NOTE — Patient Instructions (Signed)
Gout  Gout is a condition that causes painful swelling of the joints. Gout is a type of inflammation of the joints (arthritis). This condition is caused by having too much uric acid in the body. Uric acid is a chemical that forms when the body breaks down substances called purines. Purines are important for building body proteins. When the body has too much uric acid, sharp crystals can form and build up inside the joints. This causes pain and swelling. Gout attacks can happen quickly and may be very painful (acute gout). Over time, the attacks can affect more joints and become more frequent (chronic gout). Gout can also cause uric acid to build up under the skin and inside the kidneys. What are the causes? This condition is caused by too much uric acid in your blood. This can happen because: Your kidneys do not remove enough uric acid from your blood. This is the most common cause. Your body makes too much uric acid. This can happen with some cancers and cancer treatments. It can also occur if your body is breaking down too many red blood cells (hemolytic anemia). You eat too many foods that are high in purines. These foods include organ meats and some seafood. Alcohol, especially beer, is also high in purines. A gout attack may be triggered by trauma or stress. What increases the risk? The following factors may make you more likely to develop this condition: Having a family history of gout. Being female and middle-aged. Being female and having gone through menopause. Taking certain medicines, including aspirin, cyclosporine, diuretics, levodopa, and niacin. Having an organ transplant. Having certain conditions, such as: Being obese. Lead poisoning. Kidney disease. A skin condition called psoriasis. Other factors include: Losing weight too quickly. Being dehydrated. Frequently drinking alcohol, especially beer. Frequently drinking beverages that are sweetened with a type of sugar called  fructose. What are the signs or symptoms? An attack of acute gout happens quickly. It usually occurs in just one joint. The most common place is the big toe. Attacks often start at night. Other joints that may be affected include joints of the feet, ankle, knee, fingers, wrist, or elbow. Symptoms of this condition may include: Severe pain. Warmth. Swelling. Stiffness. Tenderness. The affected joint may be very painful to touch. Shiny, red, or purple skin. Chills and fever. Chronic gout may cause symptoms more frequently. More joints may be involved. You may also have white or yellow lumps (tophi) on your hands or feet or in other areas near your joints. How is this diagnosed? This condition is diagnosed based on your symptoms, your medical history, and a physical exam. You may have tests, such as: Blood tests to measure uric acid levels. Removal of joint fluid with a thin needle (aspiration) to look for uric acid crystals. X-rays to look for joint damage. How is this treated? Treatment for this condition has two phases: treating an acute attack and preventing future attacks. Acute gout treatment may include medicines to reduce pain and swelling, including: NSAIDs, such as ibuprofen. Steroids. These are strong anti-inflammatory medicines that can be taken by mouth (orally) or injected into a joint. Colchicine. This medicine relieves pain and swelling when it is taken soon after an attack. It can be given by mouth or through an IV. Preventive treatment may include: Daily use of smaller doses of NSAIDs or colchicine. Use of a medicine that reduces uric acid levels in your blood, such as allopurinol. Changes to your diet. You may need to see   a dietitian about what to eat and drink to prevent gout. Follow these instructions at home: During a gout attack  If directed, put ice on the affected area. To do this: Put ice in a plastic bag. Place a towel between your skin and the bag. Leave the  ice on for 20 minutes, 2-3 times a day. Remove the ice if your skin turns bright red. This is very important. If you cannot feel pain, heat, or cold, you have a greater risk of damage to the area. Raise (elevate) the affected joint above the level of your heart as often as possible. Rest the joint as much as possible. If the affected joint is in your leg, you may be given crutches to use. Follow instructions from your health care provider about eating or drinking restrictions. Avoiding future gout attacks Follow a low-purine diet as told by your dietitian or health care provider. Avoid foods and drinks that are high in purines, including liver, kidney, anchovies, asparagus, herring, mushrooms, mussels, and beer. Maintain a healthy weight or lose weight if you are overweight. If you want to lose weight, talk with your health care provider. Do not lose weight too quickly. Start or maintain an exercise program as told by your health care provider. Eating and drinking Avoid drinking beverages that contain fructose. Drink enough fluids to keep your urine pale yellow. If you drink alcohol: Limit how much you have to: 0-1 drink a day for women who are not pregnant. 0-2 drinks a day for men. Know how much alcohol is in a drink. In the [place] one drink equals one 12 oz bottle of beer (355 mL), one 5 oz glass of wine (148 mL), or one 1 oz glass of hard liquor (44 mL). General instructions Take over-the-counter and prescription medicines only as told by your health care provider. Ask your health care provider if the medicine prescribed to you requires you to avoid driving or using machinery. Return to your normal activities as told by your health care provider. Ask your health care provider what activities are safe for you. Keep all follow-up visits. This is important. Where to find more information National Institutes of Health: www.niams.nih.gov Contact a health care provider if you have: Another  gout attack. Continuing symptoms of a gout attack after 10 days of treatment. Side effects from your medicines. Chills or a fever. Burning pain when you urinate. Pain in your lower back or abdomen. Get help right away if you: Have severe or uncontrolled pain. Cannot urinate. Summary Gout is painful swelling of the joints caused by having too much uric acid in the body. The most common site for gout to occur is in the big toe, but it can affect other joints in the body. Medicines and dietary changes can help to prevent and treat gout attacks. This information is not intended to replace advice given to you by your health care provider. Make sure you discuss any questions you have with your health care provider. Document Revised: 09/14/2021 Document Reviewed: 09/14/2021 Elsevier Patient Education  2024 Elsevier Inc.  

## 2024-02-21 NOTE — Progress Notes (Unsigned)
 Subjective:  Patient ID: [name], female    DOB: 12-24-1933  Age: 89 y.o. MRN: [id number]  CC: Anemia and Hypertension   HPI [name] presents for f/up -----  Discussed the use of AI scribe software for clinical note transcription with the patient, who gave verbal consent to proceed.  History of Present Illness   [name] is an 89 year old female who presents with left foot pain.  She complains of pain in her right foot with no recent trauma or injury. The pain is localized to a small dent in that area. There is a small knot present in the area of pain, noted this morning. No swelling is associated with the pain.       Outpatient Medications Prior to Visit  Medication Sig Dispense Refill   Control Gel Formula Dressing (DUODERM CGF DRESSING) MISC Apply 20 each topically daily as needed. 20 each 5   gabapentin (NEURONTIN) 400 MG capsule Take 1 capsule (400 mg total) by mouth at bedtime. (Patient taking differently: Take 400 mg by mouth as needed.) 90 capsule 1   Ketotifen Fumarate (ALAWAY OP) Apply to eye as needed (itchy eyes).     latanoprost (XALATAN) 0.005 % ophthalmic solution INT 1 GTT IN OD QD     metoprolol succinate (TOPROL-XL) 25 MG 24 hr tablet TAKE 1 TABLET(25 MG) BY MOUTH DAILY 90 tablet 1   Multiple Vitamin (MULTIVITAMIN) tablet Take 1 tablet by mouth daily.     Multiple Vitamins-Minerals (HAIR SKIN & NAILS ADVANCED) TABS Take 1 mg by mouth daily.     Olopatadine HCl (PATADAY OP) Apply 1 drop to eye daily.     pantoprazole (PROTONIX) 40 MG tablet TAKE 1 TABLET BY MOUTH 1 TO 2 TIMES DAILY 180 tablet 0   Polyethyl Glycol-Propyl Glycol (SYSTANE OP) Apply to eye daily. Left eye only     rivaroxaban (XARELTO) 10 MG TABS tablet Take 1 tablet (10 mg total) by mouth daily. 90 tablet 0   traMADol-acetaminophen (ULTRACET) 37.5-325 MG tablet TAKE 1 TABLET BY MOUTH EVERY 8 HOURS AS NEEDED 60 tablet 0   FEROSUL 325 (65 Fe) MG tablet TAKE 1 TABLET(325 MG) BY MOUTH  DAILY WITH BREAKFAST 90 tablet 1   Docusate Calcium (STOOL SOFTENER PO) Take by mouth as needed. (Patient not taking: Reported on 02/21/2024)     No facility-administered medications prior to visit.    ROS Review of Systems  Constitutional: Negative.  Negative for chills, diaphoresis and fatigue.  HENT: Negative.    Eyes: Negative.   Respiratory: Negative.  Negative for cough, chest tightness, shortness of breath and wheezing.   Cardiovascular:  Negative for chest pain, palpitations and leg swelling.  Gastrointestinal:  Negative for abdominal pain, diarrhea, nausea and vomiting.  Endocrine: Negative.   Genitourinary: Negative.   Musculoskeletal:  Positive for arthralgias, gait problem and joint swelling.  Skin: Negative.   Neurological:  Negative for dizziness and weakness.  Hematological:  Negative for adenopathy. Does not bruise/bleed easily.  Psychiatric/Behavioral:  Positive for confusion and decreased concentration.     Objective:  BP 132/62 (BP Location: Left Arm, Patient Position: Sitting, Cuff Size: Small)   Pulse 86   Temp 97.9 F (36.6 C) (Oral)   Ht 4\' 10"  (1.473 m)   SpO2 99%   BMI 25.92 kg/m   BP Readings from Last 3 Encounters:  02/21/24 132/62  02/01/24 120/76  01/25/24 (!) 144/75    Wt Readings from Last 3  Encounters:  02/20/24 124 lb (56.2 kg)  01/25/24 124 lb (56.2 kg)  12/14/23 124 lb (56.2 kg)    Physical Exam Vitals reviewed.  HENT:     Mouth/Throat:     Mouth: Mucous membranes are moist.  Eyes:     General: No scleral icterus.    Conjunctiva/sclera: Conjunctivae normal.  Cardiovascular:     Rate and Rhythm: Normal rate and regular rhythm.     Heart sounds: No murmur heard.    No friction rub. No gallop.  Pulmonary:     Effort: Pulmonary effort is normal.     Breath sounds: No stridor. No wheezing, rhonchi or rales.  Abdominal:     General: Abdomen is flat.     Palpations: There is no mass.     Tenderness: There is no abdominal  tenderness. There is no guarding.     Hernia: No hernia is present.  Musculoskeletal:        General: Swelling and deformity present.     Cervical back: Neck supple.     Right lower leg: Edema present.     Left lower leg: Edema present.  Skin:    Findings: No rash.  Neurological:     General: No focal deficit present.     Mental Status: She is alert. Mental status is at baseline.  Psychiatric:        Mood and Affect: Mood normal.        Behavior: Behavior normal.     Lab Results  Component Value Date   WBC 7.2 02/21/2024   HGB 10.3 (L) 02/21/2024   HCT 31.3 (L) 02/21/2024   PLT 346.0 02/21/2024   GLUCOSE 91 02/21/2024   CHOL 145 09/03/2023   TRIG 88.0 09/03/2023   HDL 69.40 09/03/2023   LDLCALC 58 09/03/2023   ALT 13 04/23/2023   AST 15 04/23/2023   NA 141 02/21/2024   K 4.1 02/21/2024   CL 105 02/21/2024   CREATININE 0.80 02/21/2024   BUN 13 02/21/2024   CO2 31 02/21/2024   TSH 1.90 05/10/2023   INR 1.3 (H) 04/23/2023   HGBA1C 5.4 05/30/2017    VAS [place] LOWER EXTREMITY VENOUS REFLUX Result Date: 01/26/2024  Lower Venous Reflux Study Patient Name:  [name]  Date of Exam:   01/25/2024 Medical Rec #: [id number]    Accession #:    [id number] Date of Birth: Nov 06, 1933    Patient Gender: F Patient Age:   33 years Exam Location:  [facility] Procedure:      VAS [place] LOWER EXTREMITY VENOUS REFLUX Referring Phys: Carolynn Sayers --------------------------------------------------------------------------------  Indications: Swelling.  Performing Technologist: [place] Ponder RVS  Examination Guidelines: A complete evaluation includes B-mode imaging, spectral Doppler, color Doppler, and power Doppler as needed of all accessible portions of each vessel. Bilateral testing is considered an integral part of a complete examination. Limited examinations for reoccurring indications may be performed as noted. The reflux portion of the exam is performed with the patient in reverse  Trendelenburg. Significant venous reflux is defined as >500 ms in the superficial venous system, and >1 second in the deep venous system.  +--------------+---------+------+-----------+------------+-------------+ LEFT          Reflux NoRefluxReflux TimeDiameter cmsComments                              Yes                                       +--------------+---------+------+-----------+------------+-------------+  CFV           no                                                  +--------------+---------+------+-----------+------------+-------------+ FV mid        no                                                  +--------------+---------+------+-----------+------------+-------------+ Popliteal     no                                                  +--------------+---------+------+-----------+------------+-------------+ GSV at [facility]    no                           0.549                  +--------------+---------+------+-----------+------------+-------------+ GSV prox thighno                            .323                  +--------------+---------+------+-----------+------------+-------------+ GSV mid thigh                               .330                  +--------------+---------+------+-----------+------------+-------------+ GSV dist thighno                            .225    out of fascia +--------------+---------+------+-----------+------------+-------------+ GSV at knee                                 .183    out of fascia +--------------+---------+------+-----------+------------+-------------+ GSV prox calf no                            .225    out of fascia +--------------+---------+------+-----------+------------+-------------+ SSV Pop Fossa no                            .175                  +--------------+---------+------+-----------+------------+-------------+ SSV prox calf no                            .191                   +--------------+---------+------+-----------+------------+-------------+   Summary: Left: - No evidence of deep vein thrombosis seen in the left lower extremity, from the common femoral through the popliteal veins. - No evidence of superficial venous thrombosis in the left lower extremity. - There is no evidence of venous reflux seen in the  left lower extremity.  *See table(s) above for measurements and observations. Electronically signed by Lemar Livings MD on 01/26/2024 at 3:59:22 PM.    Final     Assessment & Plan:   Acquired thrombophilia (HCC)- Will continue the DOAC. -     Basic metabolic panel; Future -     CBC with Differential/Platelet; Future  Stage 3b chronic kidney disease (HCC)- Will avoid nephrotoxic agents  -     Basic metabolic panel; Future  Iron deficiency anemia secondary to inadequate dietary iron intake -     IBC + Ferritin; Future -     CBC with Differential/Platelet; Future -     ACCRUFeR; Take 1 capsule (30 mg total) by mouth in the morning and at bedtime.  Dispense: 180 capsule; Refill: 0  Essential hypertension -     Basic metabolic panel; Future  Acute gout due to renal impairment involving toe of right foot -     methylPREDNISolone; TAKE AS DIRECTED  Dispense: 21 tablet; Refill: 0 -     Colchicine; Take 1 tablet (0.6 mg total) by mouth daily for 21 days.  Dispense: 21 tablet; Refill: 0 -     Uric acid; Future     Follow-up: Return in about 4 months (around 06/20/2024).  Sanda Linger, MD

## 2024-02-22 ENCOUNTER — Telehealth: Payer: Self-pay | Admitting: Internal Medicine

## 2024-02-22 NOTE — Telephone Encounter (Signed)
**Note De-identified Hilliary Jock Obfuscation** Please advise 

## 2024-02-22 NOTE — Telephone Encounter (Signed)
 Patients niece has been made aware. Gave a verbal understanding.

## 2024-02-22 NOTE — Telephone Encounter (Signed)
 Copied from CRM [id number]. Topic: Clinical - Medication Question >> Feb 22, 2024 10:23 AM Almira Coaster wrote: Reason for CRM: Primus Bravo, Patient's niece is calling regarding patient's gout diagnosis. She reviews the labs and stated that it seemed like there wasn't a build up of uric acid and wanted to know if the patient should still be taking colchicine 0.6 MG tablet. Best call back number is [phone].

## 2024-02-23 ENCOUNTER — Encounter: Payer: Self-pay | Admitting: Internal Medicine

## 2024-02-24 NOTE — Progress Notes (Signed)
  Subjective:  Patient ID: [name], female    DOB: 1933-06-30,  MRN: [id number]  89 y.o. female presents with at risk foot care with h/o clotting disorder and corn(s) right foot, callus(es) right foot and painful mycotic nails.  Pain interferes with ambulation. Aggravating factors include wearing enclosed shoe gear. Painful toenails interfere with ambulation. Aggravating factors include wearing enclosed shoe gear. Pain is relieved with periodic professional debridement. Painful corns and calluses are aggravated when weightbearing with and without shoegear. Pain is relieved with periodic professional debridement. She is accompanied by her niece on today's visit. Chief Complaint  Patient presents with   RFC    She is here for a nail trim, PCP is Dr Yetta Barre and seen 6 months ago      PCP: Etta Grandchild, MD.  New problem(s): None.   Review of Systems: Negative except as noted in the HPI.   No Known Allergies  Objective:  There were no vitals filed for this visit. Constitutional Patient is a pleasant 89 y.o. female frail, in NAD. AAO x 3.  Vascular Capillary fill time to digits <3 seconds.  DP/PT pulse(s) are faintly palpable b/l lower extremities. Pedal hair absent b/l. Lower extremity skin temperature gradient warm to cool b/l. No pain with calf compression b/l. No cyanosis or clubbing noted. No ischemia nor gangrene noted b/l. Trace edema noted BLE.  Neurologic Protective sensation intact 5/5 intact bilaterally with 10g monofilament b/l. Vibratory sensation intact b/l.  Dermatologic Pedal skin is thin, shiny and atrophic b/l.  No open wounds b/l lower extremities. No interdigital macerations b/l lower extremities. Toenails 1-5 b/l elongated, discolored, dystrophic, thickened, crumbly with subungual debris and tenderness to dorsal palpation. Hyperkeratotic lesion(s) dorsal PIPJ of R 2nd toe.  No erythema, no edema, no drainage, no fluctuance.  Orthopedic: Normal muscle strength 5/5 to all  lower extremity muscle groups bilaterally. HAV with bunion deformity noted b/l LE. Hammertoe deformity noted 2-5 b/l.   Last HgA1c:      No data to display           Assessment:   1. Pain due to onychomycosis of toenail   2. Corns   3. Clotting disorder (HCC)    Plan:  -Patient was evaluated today. All questions/concerns addressed on today's visit. -Corn right 2nd toe debrided with bur. -Continue supportive shoe gear daily. -Toenails 1-5 b/l were debrided in length and girth with sterile nail nippers and dremel without iatrogenic bleeding.  -Patient/POA to call should there be question/concern in the interim.  Return in about 3 months (around 05/19/2024).  Freddie Breech, DPM      [place] LOCATION: [address] [place] [address]                   Office [phone]   [facility] LOCATION: [address] [place], [place] [address] Office [phone]

## 2024-02-29 ENCOUNTER — Other Ambulatory Visit: Payer: Self-pay

## 2024-02-29 ENCOUNTER — Telehealth: Payer: Self-pay

## 2024-02-29 DIAGNOSIS — D508 Other iron deficiency anemias: Secondary | ICD-10-CM

## 2024-02-29 MED ORDER — ACCRUFER 30 MG PO CAPS: 1.0000 | ORAL_CAPSULE | Freq: Two times a day (BID) | ORAL | 1 refills | Status: DC

## 2024-02-29 NOTE — Telephone Encounter (Signed)
Medication has been sent to the correct pharmacy.

## 2024-02-29 NOTE — Telephone Encounter (Signed)
 Copied from CRM [id number]. Topic: Clinical - Prescription Issue >> Feb 29, 2024 10:27 AM Drema Balzarine wrote: Reason for CRM: Patient niece Elnita Maxwell called, wants to know if there was a reason that patients Ferric Maltol (ACCRUFER) medication was sent to [facility] pharmacy? Or if it could be sent to the [facility] pharmacy on file. Please call and let her know.

## 2024-03-04 NOTE — Telephone Encounter (Signed)
 Copied from CRM [id number]. Topic: Clinical - Prescription Issue  >> Mar 04, 2024  4:29 PM [place] J wrote: Patients medication came up to be over $2,000. Is there a generic brand patient could take that's covered by insurance?

## 2024-03-05 ENCOUNTER — Other Ambulatory Visit: Payer: Self-pay | Admitting: Internal Medicine

## 2024-03-05 DIAGNOSIS — D508 Other iron deficiency anemias: Secondary | ICD-10-CM

## 2024-03-05 NOTE — Telephone Encounter (Signed)
 Can we change the accrufer ?

## 2024-03-06 NOTE — Telephone Encounter (Signed)
 Patient would like more information on the infusions, prior to making a decision. We have samples of the medication prescribed, but if we supply her with samples once she runs out she still cannot afford the price of this medication at the pharmacy.. please advise.

## 2024-03-07 ENCOUNTER — Telehealth: Payer: Self-pay | Admitting: Internal Medicine

## 2024-03-07 ENCOUNTER — Other Ambulatory Visit: Payer: Self-pay | Admitting: Internal Medicine

## 2024-03-07 DIAGNOSIS — D508 Other iron deficiency anemias: Secondary | ICD-10-CM

## 2024-03-07 NOTE — Telephone Encounter (Signed)
 Copied from CRM [id number]. Topic: Clinical - Medical Advice >> Mar 07, 2024  9:54 AM Isabell A wrote: Reason for CRM: Niece Elnita Maxwell calling - would like to let Dr.Jones know the patient would like to start the iron transfusions.   Callback number: [phone]

## 2024-03-07 NOTE — Telephone Encounter (Signed)
**Note De-identified  [place] Obfuscation** Please advise 

## 2024-03-12 DIAGNOSIS — H401131 Primary open-angle glaucoma, bilateral, mild stage: Secondary | ICD-10-CM | POA: Diagnosis not present

## 2024-03-13 ENCOUNTER — Other Ambulatory Visit: Payer: Self-pay | Admitting: Family

## 2024-03-13 DIAGNOSIS — M479 Spondylosis, unspecified: Secondary | ICD-10-CM

## 2024-03-13 DIAGNOSIS — M51369 Other intervertebral disc degeneration, lumbar region without mention of lumbar back pain or lower extremity pain: Secondary | ICD-10-CM

## 2024-03-21 ENCOUNTER — Other Ambulatory Visit: Payer: Self-pay | Admitting: Internal Medicine

## 2024-03-21 ENCOUNTER — Other Ambulatory Visit: Payer: Self-pay | Admitting: Family

## 2024-03-21 DIAGNOSIS — M479 Spondylosis, unspecified: Secondary | ICD-10-CM

## 2024-03-21 DIAGNOSIS — M51369 Other intervertebral disc degeneration, lumbar region without mention of lumbar back pain or lower extremity pain: Secondary | ICD-10-CM

## 2024-03-21 MED ORDER — TRAMADOL-ACETAMINOPHEN 37.5-325 MG PO TABS: 1.0000 | ORAL_TABLET | Freq: Three times a day (TID) | ORAL | 0 refills | Status: DC | PRN

## 2024-03-21 NOTE — Telephone Encounter (Signed)
 Copied from CRM [id number]. Topic: Clinical - Medication Refill >> Mar 21, 2024 11:16 AM Pascal Lux wrote: Most Recent Primary Care Visit:  Provider: Etta Grandchild  Department: [facility]  Visit Type: OFFICE VISIT  Date: 02/21/2024  Medication: traMADol-acetaminophen (ULTRACET) 37.5-325 MG tablet [[id number]]  Has the patient contacted their pharmacy? Yes (Agent: If no, request that the patient contact the pharmacy for the refill. If patient does not wish to contact the pharmacy document the reason why and proceed with request.) (Agent: If yes, when and what did the pharmacy advise?) call provider  Is this the correct pharmacy for this prescription? Yes If no, delete pharmacy and type the correct one.  This is the patient's preferred pharmacy:  [facility] [id number] - [place], [place] - [address] AT [facility] [address] [place] [address] Phone: [phone] Fax: [phone]   Has the prescription been filled recently? Yes  Is the patient out of the medication? No, have 4 pills left  Has the patient been seen for an appointment in the last year OR does the patient have an upcoming appointment? Yes  Can we respond through MyChart? No  Agent: Please be advised that Rx refills may take up to 3 business days. We ask that you follow-up with your pharmacy.

## 2024-03-29 ENCOUNTER — Other Ambulatory Visit: Payer: Self-pay | Admitting: Internal Medicine

## 2024-03-29 DIAGNOSIS — K219 Gastro-esophageal reflux disease without esophagitis: Secondary | ICD-10-CM

## 2024-04-02 ENCOUNTER — Inpatient Hospital Stay: Attending: Hematology | Admitting: Hematology

## 2024-04-02 ENCOUNTER — Inpatient Hospital Stay

## 2024-04-02 ENCOUNTER — Encounter: Payer: Self-pay | Admitting: Hematology

## 2024-04-02 VITALS — BP 140/78 | HR 87 | Temp 98.6°F | Resp 22 | Ht <= 58 in | Wt 116.1 lb

## 2024-04-02 DIAGNOSIS — Z8719 Personal history of other diseases of the digestive system: Secondary | ICD-10-CM | POA: Insufficient documentation

## 2024-04-02 DIAGNOSIS — I872 Venous insufficiency (chronic) (peripheral): Secondary | ICD-10-CM | POA: Insufficient documentation

## 2024-04-02 DIAGNOSIS — Z7901 Long term (current) use of anticoagulants: Secondary | ICD-10-CM | POA: Diagnosis not present

## 2024-04-02 DIAGNOSIS — M1990 Unspecified osteoarthritis, unspecified site: Secondary | ICD-10-CM | POA: Insufficient documentation

## 2024-04-02 DIAGNOSIS — D649 Anemia, unspecified: Secondary | ICD-10-CM

## 2024-04-02 DIAGNOSIS — M810 Age-related osteoporosis without current pathological fracture: Secondary | ICD-10-CM | POA: Diagnosis not present

## 2024-04-02 DIAGNOSIS — M109 Gout, unspecified: Secondary | ICD-10-CM | POA: Diagnosis not present

## 2024-04-02 DIAGNOSIS — Z803 Family history of malignant neoplasm of breast: Secondary | ICD-10-CM | POA: Diagnosis not present

## 2024-04-02 DIAGNOSIS — D631 Anemia in chronic kidney disease: Secondary | ICD-10-CM | POA: Insufficient documentation

## 2024-04-02 DIAGNOSIS — K219 Gastro-esophageal reflux disease without esophagitis: Secondary | ICD-10-CM | POA: Diagnosis not present

## 2024-04-02 DIAGNOSIS — Z79899 Other long term (current) drug therapy: Secondary | ICD-10-CM | POA: Diagnosis not present

## 2024-04-02 DIAGNOSIS — I129 Hypertensive chronic kidney disease with stage 1 through stage 4 chronic kidney disease, or unspecified chronic kidney disease: Secondary | ICD-10-CM | POA: Insufficient documentation

## 2024-04-02 DIAGNOSIS — F0390 Unspecified dementia without behavioral disturbance: Secondary | ICD-10-CM | POA: Diagnosis not present

## 2024-04-02 DIAGNOSIS — N189 Chronic kidney disease, unspecified: Secondary | ICD-10-CM | POA: Diagnosis not present

## 2024-04-02 DIAGNOSIS — I2510 Atherosclerotic heart disease of native coronary artery without angina pectoris: Secondary | ICD-10-CM | POA: Insufficient documentation

## 2024-04-02 DIAGNOSIS — I10 Essential (primary) hypertension: Secondary | ICD-10-CM | POA: Diagnosis not present

## 2024-04-02 LAB — CBC WITH DIFFERENTIAL/PLATELET
Abs Immature Granulocytes: 0.02 10*3/uL (ref 0.00–0.07)
Basophils Absolute: 0 10*3/uL (ref 0.0–0.1)
Basophils Relative: 1 %
Eosinophils Absolute: 0.1 10*3/uL (ref 0.0–0.5)
Eosinophils Relative: 3 %
HCT: 35 % — ABNORMAL LOW (ref 36.0–46.0)
Hemoglobin: 11.4 g/dL — ABNORMAL LOW (ref 12.0–15.0)
Immature Granulocytes: 0 %
Lymphocytes Relative: 13 %
Lymphs Abs: 0.6 10*3/uL — ABNORMAL LOW (ref 0.7–4.0)
MCH: 27.4 pg (ref 26.0–34.0)
MCHC: 32.6 g/dL (ref 30.0–36.0)
MCV: 84.1 fL (ref 80.0–100.0)
Monocytes Absolute: 0.3 10*3/uL (ref 0.1–1.0)
Monocytes Relative: 7 %
Neutro Abs: 3.7 10*3/uL (ref 1.7–7.7)
Neutrophils Relative %: 76 %
Platelets: 364 10*3/uL (ref 150–400)
RBC: 4.16 MIL/uL (ref 3.87–5.11)
RDW: 14.9 % (ref 11.5–15.5)
WBC: 4.8 10*3/uL (ref 4.0–10.5)
nRBC: 0 % (ref 0.0–0.2)

## 2024-04-02 LAB — VITAMIN B12: Vitamin B-12: 771 pg/mL (ref 180–914)

## 2024-04-02 LAB — RETIC PANEL
Immature Retic Fract: 13.6 % (ref 2.3–15.9)
RBC.: 4.15 MIL/uL (ref 3.87–5.11)
Retic Count, Absolute: 48.1 10*3/uL (ref 19.0–186.0)
Retic Ct Pct: 1.2 % (ref 0.4–3.1)
Reticulocyte Hemoglobin: 32.2 pg (ref >27.9)

## 2024-04-02 NOTE — Progress Notes (Signed)
 [facility]   [phone] [phone]   Clinic New Consult Note   Patient Care Team: Etta Grandchild, MD as PCP - General (Internal Medicine) Pricilla Riffle, MD (Cardiology) Cherlyn Roberts, MD (Dermatology) Louis Meckel, MD (Inactive) (Gastroenterology) Enzo Bi, MD (Inactive) (Obstetrics and Gynecology) Ernesto Rutherford, MD (Ophthalmology) Dominica Severin, MD (Orthopedic Surgery) 04/02/2024  CHIEF COMPLAINTS/PURPOSE OF CONSULTATION:  Anemia   REFERRING PHYSICIAN: Etta Grandchild, MD   Discussed the use of AI scribe software for clinical note transcription with the patient, who gave verbal consent to proceed.  History of Present Illness The patient, an 89 year old female, was referred to [place] due to anemia.  She was referred by her PCP Dr. Yetta Barre.  She presents to clinic with her niece today.  She came in a wheelchair.    She has a long-standing history of anemia, dating back to at least 2007. The patient reports that she has always been "below blood" and has never been "real fat." She recalls having a blood transfusion a long time ago but is unsure of the details. The patient also has a history of blood clots, first discovered in September 2023. She has been on Xarelto since May 2024. The patient's legs are swollen and have turned dark, which is a concern for her. She has seen a vein specialist for this issue, but the swelling has not resolved. The patient also has arthritis and kidney problems. She had a finger amputated due to an infection from a bug bite while working in the yard. The patient lives alone but has an aide who comes in daily. She is currently taking several medications including gabapentin, metoprolol, Xarelto, Colace, multivitamin, eye drops, tramadol, and Tylenol. The patient's iron levels have been low, and she has been taking iron supplements for a long time.     MEDICAL HISTORY:  Past Medical History:  Diagnosis Date    Abnormal finding on Pap smear, ASCUS    Acute renal insufficiency    Amputation finger    index   Barrett's esophagus    CAD (coronary artery disease)    Dementia (HCC)    Diverticulosis    Elevated BP    Foot deformity    GERD (gastroesophageal reflux disease)    Glaucoma    H/O: GI bleed    HSV-2 (herpes simplex virus 2) infection    Hyperglycemia    Hypertension    Migraine    Osteoporosis    worsening   Spondylosis     SURGICAL HISTORY: Past Surgical History:  Procedure Laterality Date   AMPUTATION FINGER / THUMB     secondary to osteomyelitis   BIOPSY  04/24/2023   Procedure: BIOPSY;  Surgeon: Shellia Cleverly, DO;  Location: [facility] ENDOSCOPY;  Service: Gastroenterology;;   CATARACT EXTRACTION, BILATERAL Bilateral 12/25/2016   COLONOSCOPY     ESOPHAGOGASTRODUODENOSCOPY N/A 10/22/2016   Procedure: ESOPHAGOGASTRODUODENOSCOPY (EGD);  Surgeon: Sherrilyn Rist, MD;  Location: [facility] ENDOSCOPY;  Service: Endoscopy;  Laterality: N/A;   ESOPHAGOGASTRODUODENOSCOPY N/A 04/24/2023   Procedure: ESOPHAGOGASTRODUODENOSCOPY (EGD);  Surgeon: Shellia Cleverly, DO;  Location: [facility] ENDOSCOPY;  Service: Gastroenterology;  Laterality: N/A;   FOOT SURGERY     right   PARTIAL HYSTERECTOMY     UPPER GASTROINTESTINAL ENDOSCOPY      SOCIAL HISTORY: Social History   Socioeconomic History   Marital status: Single    Spouse name: Not on file   Number of children: 1   Years of  education: Not on file   Highest education level: 12th grade  Occupational History    Employer: RETIRED  Tobacco Use   Smoking status: Never   Smokeless tobacco: Never  Vaping Use   Vaping status: Never Used  Substance and Sexual Activity   Alcohol use: No    Alcohol/week: 0.0 standard drinks of alcohol   Drug use: No   Sexual activity: Never    Birth control/protection: Post-menopausal  Other Topics Concern   Not on file  Social History Narrative   Marital status: divorced.      Children: 1 son; no  grandchildren      Lives: with son      Employment: retired      Tobacco: none      Alcohol: none      Drugs: none       Exercise: none       ADLs:  Independent with ADLs.  Quit driving age 4.  Medical CNA cleans house; makes breakfast; patient pays bills.      Advanced Directives:   YES.  HCPOA:  Son/Isaac Bowman.  FULL CODE; no prolonged measures.   Social Drivers of Corporate investment banker Strain: Low Risk  (05/28/2023)   Overall Financial Resource Strain (CARDIA)    Difficulty of Paying Living Expenses: Not hard at all  Food Insecurity: No Food Insecurity (05/28/2023)   Hunger Vital Sign    Worried About Running Out of Food in the Last Year: Never true    Ran Out of Food in the Last Year: Never true  Transportation Needs: No Transportation Needs (05/28/2023)   PRAPARE - Administrator, Civil Service (Medical): No    Lack of Transportation (Non-Medical): No  Physical Activity: Sufficiently Active (05/28/2023)   Exercise Vital Sign    Days of Exercise per Week: 7 days    Minutes of Exercise per Session: 30 min  Stress: No Stress Concern Present (05/28/2023)   [employer] of Occupational Health - Occupational Stress Questionnaire    Feeling of Stress : Not at all  Social Connections: Moderately Integrated (05/28/2023)   Social Connection and Isolation Panel [NHANES]    Frequency of Communication with Friends and Family: Three times a week    Frequency of Social Gatherings with Friends and Family: More than three times a week    Attends Religious Services: 1 to 4 times per year    Active Member of [employer] or Organizations: Yes    Attends Banker Meetings: 1 to 4 times per year    Marital Status: Never married  Intimate Partner Violence: Not At Risk (05/28/2023)   Humiliation, Afraid, Rape, and Kick questionnaire    Fear of Current or Ex-Partner: No    Emotionally Abused: No    Physically Abused: No    Sexually Abused: No    FAMILY HISTORY: Family  History  Problem Relation Age of Onset   Breast cancer Mother    Heart attack Mother    Heart disease Father    Alcohol abuse Son    Colon cancer Neg Hx    Stomach cancer Neg Hx    Rectal cancer Neg Hx     ALLERGIES:  has no known allergies.  MEDICATIONS:  Current Outpatient Medications  Medication Sig Dispense Refill   gabapentin (NEURONTIN) 400 MG capsule Take 1 capsule (400 mg total) by mouth at bedtime. (Patient taking differently: Take 400 mg by mouth as needed.) 90 capsule 1   Ketotifen  Fumarate (ALAWAY OP) Apply to eye as needed (itchy eyes).     latanoprost (XALATAN) 0.005 % ophthalmic solution INT 1 GTT IN OD QD     metoprolol succinate (TOPROL-XL) 25 MG 24 hr tablet TAKE 1 TABLET(25 MG) BY MOUTH DAILY 90 tablet 1   Multiple Vitamin (MULTIVITAMIN) tablet Take 1 tablet by mouth daily.     Multiple Vitamins-Minerals (HAIR SKIN & NAILS ADVANCED) TABS Take 1 mg by mouth daily.     Olopatadine HCl (PATADAY OP) Apply 1 drop to eye daily.     Olopatadine HCl (PATADAY OP) Apply to eye.     pantoprazole (PROTONIX) 40 MG tablet TAKE 1 TABLET BY MOUTH 1 TO 2 TIMES DAILY 180 tablet 0   Polyethyl Glycol-Propyl Glycol (SYSTANE OP) Apply to eye daily. Left eye only     Propylene Glycol (SYSTANE COMPLETE OP) Apply to eye.     rivaroxaban (XARELTO) 10 MG TABS tablet Take 1 tablet (10 mg total) by mouth daily. 90 tablet 0   traMADol-acetaminophen (ULTRACET) 37.5-325 MG tablet Take 1 tablet by mouth every 8 (eight) hours as needed. 270 tablet 0   colchicine 0.6 MG tablet Take 1 tablet (0.6 mg total) by mouth daily for 21 days. 21 tablet 0   Control Gel Formula Dressing (DUODERM CGF DRESSING) MISC Apply 20 each topically daily as needed. (Patient not taking: Reported on 04/02/2024) 20 each 5   Docusate Calcium (STOOL SOFTENER PO) Take by mouth as needed. (Patient not taking: Reported on 04/02/2024)     No current facility-administered medications for this visit.    REVIEW OF SYSTEMS:    Constitutional: Denies fevers, chills or abnormal night sweats Eyes: Denies blurriness of vision, double vision or watery eyes Ears, nose, mouth, throat, and face: Denies mucositis or sore throat Respiratory: Denies cough, dyspnea or wheezes Cardiovascular: Denies palpitation, chest discomfort or lower extremity swelling Gastrointestinal:  Denies nausea, heartburn or change in bowel habits Skin: Denies abnormal skin rashes Lymphatics: Denies new lymphadenopathy or easy bruising Neurological:Denies numbness, tingling or new weaknesses Behavioral/Psych: Mood is stable, no new changes  All other systems were reviewed with the patient and are negative.  PHYSICAL EXAMINATION: ECOG PERFORMANCE STATUS: 2 - Symptomatic, <50% confined to bed  Vitals:   04/02/24 1516  BP: (!) 140/78  Pulse: 87  Resp: (!) 22  Temp: 98.6 F (37 C)  SpO2: 99%   Filed Weights   04/02/24 1516  Weight: 116 lb 1.6 oz (52.7 kg)    GENERAL:alert, no distress and comfortable SKIN: skin color, texture, turgor are normal, no rashes or significant lesions EYES: normal, conjunctiva are pink and non-injected, sclera clear OROPHARYNX:no exudate, no erythema and lips, buccal mucosa, and tongue normal  NECK: supple, thyroid normal size, non-tender, without nodularity LYMPH:  no palpable lymphadenopathy in the cervical, axillary or inguinal LUNGS: clear to auscultation and percussion with normal breathing effort HEART: regular rate & rhythm and no murmurs and no lower extremity edema ABDOMEN:abdomen soft, non-tender and normal bowel sounds Musculoskeletal:no cyanosis of digits and no clubbing  PSYCH: alert & oriented x 3 with fluent speech NEURO: no focal motor/sensory deficits  Physical Exam   LABORATORY DATA:  I have reviewed the data as listed    Latest Ref Rng & Units 04/02/2024    4:05 PM 02/21/2024   12:13 PM 09/03/2023   11:09 AM  CBC  WBC 4.0 - 10.5 K/uL 4.8  7.2  5.4   Hemoglobin 12.0 - 15.0 g/dL 69.6   29.5  11.0   Hematocrit 36.0 - 46.0 % 35.0  31.3  34.6   Platelets 150 - 400 K/uL 364  346.0  293.0       Latest Ref Rng & Units 02/21/2024   12:13 PM 09/03/2023   11:09 AM 04/27/2023    5:54 AM  CMP  Glucose 70 - 99 mg/dL 91  72  161   BUN 6 - 23 mg/dL 13  20  19    Creatinine 0.40 - 1.20 mg/dL 0.96  0.45  4.09   Sodium 135 - 145 mEq/L 141  145  143   Potassium 3.5 - 5.1 mEq/L 4.1  4.0  4.0   Chloride 96 - 112 mEq/L 105  108  110   CO2 19 - 32 mEq/L 31  31  25    Calcium 8.4 - 10.5 mg/dL 9.3  9.2  8.4      RADIOGRAPHIC STUDIES: I have personally reviewed the radiological images as listed and agreed with the findings in the report. No results found.  Assessment & Plan 89 year old female with past medical history of hypertension, DVT on Xarelto, CKD, was referred for chronic anemia.  Anemia of iron deficiency and chronic disease Chronic anemia with hemoglobin levels ranging from 9 to 11 g/dL, likely due to chronic disease, aging, and past infections. No suspicion of MDS, leukemia, or lymphoma. Nutritional anemia and rapid red blood cell turnover are less likely. She has been taking oral iron supplements, but prescription iron was too expensive, and there is no history of IV iron administration. Blood transfusions are not required at this level. - Order blood tests to check iron levels and other relevant parameters - Consider IV iron if hemoglobin levels are significantly low or if her iron level is due significantly low.  Benefit and potential side effect of IV iron were discussed with her, especially allergy reactions.  She is interested if needed.  Chronic kidney disease (CKD) CKD likely contributing to anemia and overall health status. - Monitor kidney function as part of routine care  Venous insufficiency with stasis dermatitis Chronic venous insufficiency causing leg swelling and skin darkening, particularly on the left side, due to decreased venous return. Occasional blisters and  discomfort upon pressure. Surgical intervention is not recommended due to age. Venous stasis is not reversible, and color change is not a serious medical problem. - Continue monitoring with primary care physician and vein specialist  Deep vein thrombosis (DVT) DVT in the right leg discovered in September 2023, treated with Xarelto, later discontinued due to low hemoglobin levels. Leg swelling attributed to venous insufficiency rather than active DVT. - Monitor for symptoms of DVT - Maintain communication with primary care physician regarding leg swelling  Arthritis Arthritis contributing to mobility issues. She uses a cane and walker for ambulation. - Continue current management with pain medications as needed  Plan -Will repeat a CBC and iron study, and obtain anemia workup today -Phone visit in 3 weeks to review her results. -Will decide if she needs IV iron based on lab results today.     Orders Placed This Encounter  Procedures   CBC with Differential/Platelet    Standing Status:   Standing    Number of Occurrences:   50    Expiration Date:   04/02/2025   Folate RBC    Standing Status:   Future    Number of Occurrences:   1    Expiration Date:   04/02/2025   Ferritin    Standing Status:  Standing    Number of Occurrences:   20    Expiration Date:   04/02/2025   Haptoglobin    Standing Status:   Future    Number of Occurrences:   1    Expiration Date:   04/02/2025   Vitamin B12    Standing Status:   Future    Number of Occurrences:   1    Expected Date:   04/02/2024    Expiration Date:   04/02/2025   Kappa/lambda light chains    Standing Status:   Future    Number of Occurrences:   1    Expected Date:   04/02/2024    Expiration Date:   04/02/2025   Multiple Myeloma Panel (SPEP&IFE w/QIG)    Standing Status:   Future    Number of Occurrences:   1    Expected Date:   04/02/2024    Expiration Date:   04/02/2025   TSH    Standing Status:   Future    Number of Occurrences:   1     Expected Date:   04/02/2024    Expiration Date:   04/02/2025   Retic Panel    Standing Status:   Future    Number of Occurrences:   1    Expiration Date:   04/02/2025    All questions were answered. The patient knows to call the clinic with any problems, questions or concerns. I spent 30 minutes counseling the patient face to face. The total time spent in the appointment was 40 minutes and more than 50% was on counseling.     Malachy Mood, MD 04/02/2024 5:30 PM

## 2024-04-03 LAB — KAPPA/LAMBDA LIGHT CHAINS
Kappa free light chain: 34.5 mg/L — ABNORMAL HIGH (ref 3.3–19.4)
Kappa, lambda light chain ratio: 1.3 (ref 0.26–1.65)
Lambda free light chains: 26.6 mg/L — ABNORMAL HIGH (ref 5.7–26.3)

## 2024-04-03 LAB — FOLATE RBC
Folate, Hemolysate: 470 ng/mL
Folate, RBC: 1288 ng/mL (ref >498)
Hematocrit: 36.5 % (ref 34.0–46.6)

## 2024-04-03 LAB — FERRITIN
Ferritin: 15 ng/mL (ref 11–307)
Ferritin: 16 ng/mL (ref 11–307)

## 2024-04-03 LAB — HAPTOGLOBIN: Haptoglobin: 150 mg/dL (ref 41–333)

## 2024-04-03 LAB — TSH: TSH: 1.526 u[IU]/mL (ref 0.350–4.500)

## 2024-04-06 LAB — MULTIPLE MYELOMA PANEL, SERUM
Albumin SerPl Elph-Mcnc: 3.5 g/dL (ref 2.9–4.4)
Albumin/Glob SerPl: 1.2 (ref 0.7–1.7)
Alpha 1: 0.3 g/dL (ref 0.0–0.4)
Alpha2 Glob SerPl Elph-Mcnc: 0.7 g/dL (ref 0.4–1.0)
B-Globulin SerPl Elph-Mcnc: 1.1 g/dL (ref 0.7–1.3)
Gamma Glob SerPl Elph-Mcnc: 1 g/dL (ref 0.4–1.8)
Globulin, Total: 3.1 g/dL (ref 2.2–3.9)
IgA: 319 mg/dL (ref 64–422)
IgG (Immunoglobin G), Serum: 1232 mg/dL (ref 586–1602)
IgM (Immunoglobulin M), Srm: 47 mg/dL (ref 26–217)
Total Protein ELP: 6.6 g/dL (ref 6.0–8.5)

## 2024-04-23 ENCOUNTER — Encounter: Payer: Self-pay | Admitting: Hematology

## 2024-04-23 ENCOUNTER — Inpatient Hospital Stay (HOSPITAL_BASED_OUTPATIENT_CLINIC_OR_DEPARTMENT_OTHER): Admitting: Hematology

## 2024-04-23 DIAGNOSIS — N189 Chronic kidney disease, unspecified: Secondary | ICD-10-CM | POA: Diagnosis not present

## 2024-04-23 DIAGNOSIS — M109 Gout, unspecified: Secondary | ICD-10-CM | POA: Diagnosis not present

## 2024-04-23 DIAGNOSIS — D508 Other iron deficiency anemias: Secondary | ICD-10-CM | POA: Diagnosis not present

## 2024-04-23 DIAGNOSIS — M1990 Unspecified osteoarthritis, unspecified site: Secondary | ICD-10-CM | POA: Diagnosis not present

## 2024-04-23 DIAGNOSIS — D631 Anemia in chronic kidney disease: Secondary | ICD-10-CM | POA: Diagnosis not present

## 2024-04-23 DIAGNOSIS — I2510 Atherosclerotic heart disease of native coronary artery without angina pectoris: Secondary | ICD-10-CM | POA: Diagnosis not present

## 2024-04-23 DIAGNOSIS — I129 Hypertensive chronic kidney disease with stage 1 through stage 4 chronic kidney disease, or unspecified chronic kidney disease: Secondary | ICD-10-CM | POA: Diagnosis not present

## 2024-04-23 NOTE — Progress Notes (Signed)
 [facility]   [phone] [phone]) [phone]   Clinic Follow up Note   Patient Care Team: Arcadio Knuckles, MD as PCP - General (Internal Medicine) Elmyra Haggard, MD (Cardiology) Eduardo Grade, MD (Dermatology) Claudette Cue, MD (Inactive) (Gastroenterology) Derry Flock, MD (Inactive) (Obstetrics and Gynecology) Maris Sickle, MD (Ophthalmology) Ronn Cohn, MD (Orthopedic Surgery) 04/23/2024  I connected with Susette Erb on 04/23/24 at  3:40 PM EDT by telephone and verified that I am speaking with the correct person using two identifiers.   I discussed the limitations, risks, security and privacy concerns of performing an evaluation and management service by telephone and the availability of in person appointments. I also discussed with the patient that there may be a patient responsible charge related to this service. The patient expressed understanding and agreed to proceed.   Patient's location: Home Provider's location:  Office    CHIEF COMPLAINT: Review anemia lab work results   CURRENT THERAPY: Oral iron   Assessment & Plan Iron deficiency anemia Mild iron deficiency anemia with hemoglobin improved to 11.4 from a previous low of 8.4. Ferritin is low at 15. B12 and folate levels are normal, excluding nutritional deficiencies. Tests for hemolytic anemia and multiple myeloma were negative. Thyroid  function is normal. Anemia is likely due to low iron intake, with improvement noted from oral iron supplementation. Further extensive workup is not planned unless anemia worsens. - Continue oral iron supplementation. - Encourage consumption of chicken and fish for dietary iron intake. - Advise taking iron with orange juice or vitamin C to enhance absorption. - Ensure iron supplement contains at least 65 mg of iron. - Monitor iron levels and blood counts every 3 to 6 months.  Gout Gout affecting the legs and feet, with associated  swelling and redness. Current treatment includes antibiotics and gout medication. Symptoms are recurring, indicating a need for ongoing management. - Follow up with Dr. Rochelle Chu for management of gout and recurrent leg swelling.   Plan - Her anemia workup or lab results reviewed with patient and her niece, mildly low iron level, otherwise negative workup. - Her anemia has improved, hemoglobin 11.4 - Continue oral iron with orange juice or vitamin C, I also encouraged her to have iron rich diet - I will see her as needed.  She will follow-up with her PCP Dr. Rochelle Chu for CBC and iron study every 3 to 6 months.   Discussed the use of AI scribe software for clinical note transcription with the patient, who gave verbal consent to proceed.  History of Present Illness Antje Raffo is an 89 year old female with anemia who presents for follow-up of her condition.  Her anemia has shown improvement with a current hemoglobin level of 11.4, up from a previous low of 8.4. Iron levels have also improved, with a ferritin level of 15, up from 8, following six to eight months of oral iron supplementation. B12 and folate levels are within normal limits. Tests for hemolytic anemia and multiple myeloma are negative. Thyroid  function is normal.  She has a diagnosis of gout with recurrent leg swelling, which becomes red and shiny. This swelling responds to antibiotics but recurs frequently. She completed a course of medication for gout, but her leg symptoms are reappearing.     REVIEW OF SYSTEMS:   Constitutional: Denies fevers, chills or abnormal weight loss Eyes: Denies blurriness of vision Ears, nose, mouth, throat, and face: Denies mucositis or sore throat Respiratory: Denies cough, dyspnea or wheezes  Cardiovascular: Denies palpitation, chest discomfort or lower extremity swelling Gastrointestinal:  Denies nausea, heartburn or change in bowel habits Skin: Denies abnormal skin rashes Lymphatics: Denies new  lymphadenopathy or easy bruising Neurological:Denies numbness, tingling or new weaknesses Behavioral/Psych: Mood is stable, no new changes  All other systems were reviewed with the patient and are negative.  MEDICAL HISTORY:  Past Medical History:  Diagnosis Date   Abnormal finding on Pap smear, ASCUS    Acute renal insufficiency    Amputation finger    index   Barrett's esophagus    CAD (coronary artery disease)    Dementia (HCC)    Diverticulosis    Elevated BP    Foot deformity    GERD (gastroesophageal reflux disease)    Glaucoma    H/O: GI bleed    HSV-2 (herpes simplex virus 2) infection    Hyperglycemia    Hypertension    Migraine    Osteoporosis    worsening   Spondylosis     SURGICAL HISTORY: Past Surgical History:  Procedure Laterality Date   AMPUTATION FINGER / THUMB     secondary to osteomyelitis   BIOPSY  04/24/2023   Procedure: BIOPSY;  Surgeon: Annis Kinder, DO;  Location: [facility] ENDOSCOPY;  Service: Gastroenterology;;   CATARACT EXTRACTION, BILATERAL Bilateral 12/25/2016   COLONOSCOPY     ESOPHAGOGASTRODUODENOSCOPY N/A 10/22/2016   Procedure: ESOPHAGOGASTRODUODENOSCOPY (EGD);  Surgeon: Albertina Hugger, MD;  Location: [facility] ENDOSCOPY;  Service: Endoscopy;  Laterality: N/A;   ESOPHAGOGASTRODUODENOSCOPY N/A 04/24/2023   Procedure: ESOPHAGOGASTRODUODENOSCOPY (EGD);  Surgeon: Annis Kinder, DO;  Location: [facility] ENDOSCOPY;  Service: Gastroenterology;  Laterality: N/A;   FOOT SURGERY     right   PARTIAL HYSTERECTOMY     UPPER GASTROINTESTINAL ENDOSCOPY      I have reviewed the social history and family history with the patient and they are unchanged from previous note.  ALLERGIES:  has no known allergies.  MEDICATIONS:  Current Outpatient Medications  Medication Sig Dispense Refill   colchicine  0.6 MG tablet Take 1 tablet (0.6 mg total) by mouth daily for 21 days. 21 tablet 0   Control Gel Formula Dressing (DUODERM CGF DRESSING) MISC Apply 20 each  topically daily as needed. (Patient not taking: Reported on 04/02/2024) 20 each 5   Docusate Calcium (STOOL SOFTENER PO) Take by mouth as needed. (Patient not taking: Reported on 04/02/2024)     gabapentin  (NEURONTIN ) 400 MG capsule Take 1 capsule (400 mg total) by mouth at bedtime. (Patient taking differently: Take 400 mg by mouth as needed.) 90 capsule 1   Ketotifen Fumarate (ALAWAY OP) Apply to eye as needed (itchy eyes).     latanoprost  (XALATAN ) 0.005 % ophthalmic solution INT 1 GTT IN OD QD     metoprolol  succinate (TOPROL -XL) 25 MG 24 hr tablet TAKE 1 TABLET(25 MG) BY MOUTH DAILY 90 tablet 1   Multiple Vitamin (MULTIVITAMIN) tablet Take 1 tablet by mouth daily.     Multiple Vitamins-Minerals (HAIR SKIN & NAILS ADVANCED) TABS Take 1 mg by mouth daily.     Olopatadine HCl (PATADAY OP) Apply 1 drop to eye daily.     Olopatadine HCl (PATADAY OP) Apply to eye.     pantoprazole  (PROTONIX ) 40 MG tablet TAKE 1 TABLET BY MOUTH 1 TO 2 TIMES DAILY 180 tablet 0   Polyethyl Glycol-Propyl Glycol (SYSTANE OP) Apply to eye daily. Left eye only     Propylene Glycol (SYSTANE COMPLETE OP) Apply to eye.  rivaroxaban  (XARELTO ) 10 MG TABS tablet Take 1 tablet (10 mg total) by mouth daily. 90 tablet 0   traMADol -acetaminophen  (ULTRACET ) 37.5-325 MG tablet Take 1 tablet by mouth every 8 (eight) hours as needed. 270 tablet 0   No current facility-administered medications for this visit.    PHYSICAL EXAMINATION: Not performed   LABORATORY DATA:  I have reviewed the data as listed    Latest Ref Rng & Units 04/02/2024    4:07 PM 04/02/2024    4:05 PM 02/21/2024   12:13 PM  CBC  WBC 4.0 - 10.5 K/uL  4.8  7.2   Hemoglobin 12.0 - 15.0 g/dL  69.6  29.5   Hematocrit 34.0 - 46.6 % 36.5  35.0  31.3   Platelets 150 - 400 K/uL  364  346.0         Latest Ref Rng & Units 02/21/2024   12:13 PM 09/03/2023   11:09 AM 04/27/2023    5:54 AM  CMP  Glucose 70 - 99 mg/dL 91  72  284   BUN 6 - 23 mg/dL 13  20  19     Creatinine 0.40 - 1.20 mg/dL 1.32  4.40  1.02   Sodium 135 - 145 mEq/L 141  145  143   Potassium 3.5 - 5.1 mEq/L 4.1  4.0  4.0   Chloride 96 - 112 mEq/L 105  108  110   CO2 19 - 32 mEq/L 31  31  25    Calcium 8.4 - 10.5 mg/dL 9.3  9.2  8.4       RADIOGRAPHIC STUDIES: I have personally reviewed the radiological images as listed and agreed with the findings in the report. No results found.     I discussed the assessment and treatment plan with the patient. The patient was provided an opportunity to ask questions and all were answered. The patient agreed with the plan and demonstrated an understanding of the instructions.   The patient was advised to call back or seek an in-person evaluation if the symptoms worsen or if the condition fails to improve as anticipated.  I provided 15 minutes of non face-to-face telephone visit time during this encounter, and > 50% was spent counseling as documented under my assessment & plan.     Sonja [place], MD 04/23/24

## 2024-05-02 ENCOUNTER — Other Ambulatory Visit: Payer: Self-pay | Admitting: Internal Medicine

## 2024-05-02 DIAGNOSIS — I2510 Atherosclerotic heart disease of native coronary artery without angina pectoris: Secondary | ICD-10-CM

## 2024-05-02 DIAGNOSIS — D6869 Other thrombophilia: Secondary | ICD-10-CM

## 2024-05-02 DIAGNOSIS — I82511 Chronic embolism and thrombosis of right femoral vein: Secondary | ICD-10-CM

## 2024-05-02 DIAGNOSIS — I10 Essential (primary) hypertension: Secondary | ICD-10-CM

## 2024-05-07 ENCOUNTER — Encounter: Payer: Self-pay | Admitting: Internal Medicine

## 2024-05-07 ENCOUNTER — Ambulatory Visit (INDEPENDENT_AMBULATORY_CARE_PROVIDER_SITE_OTHER): Payer: Medicare Other | Admitting: Internal Medicine

## 2024-05-07 VITALS — BP 142/80 | HR 66 | Temp 98.6°F | Resp 16 | Ht <= 58 in | Wt 115.4 lb

## 2024-05-07 DIAGNOSIS — I10 Essential (primary) hypertension: Secondary | ICD-10-CM | POA: Diagnosis not present

## 2024-05-07 DIAGNOSIS — D508 Other iron deficiency anemias: Secondary | ICD-10-CM | POA: Diagnosis not present

## 2024-05-07 NOTE — Progress Notes (Unsigned)
 Subjective:  Patient ID: [name], female    DOB: June 04, 1933  Age: 89 y.o. MRN: [id number]  CC: Hypertension   HPI [name] presents for f/up ---  Discussed the use of AI scribe software for clinical note transcription with the patient, who gave verbal consent to proceed.  History of Present Illness   [name] is an 89 year old female with a history of gout who presents with leg pain.  She experiences pain in her lower legs, particularly when touched. The pain is localized to the lower legs and may be related to her history of gout.  She is experiencing feelings of worry and stress, possibly related to the recent deaths of people around her age, including her mother.  No headaches, blurred vision, shoulder pain, chest pain, or loss of blood.       Outpatient Medications Prior to Visit  Medication Sig Dispense Refill   Docusate Calcium (STOOL SOFTENER PO) Take by mouth as needed.     Ferrous Sulfate  (IRON PO) Take by mouth daily.     gabapentin  (NEURONTIN ) 400 MG capsule Take 1 capsule (400 mg total) by mouth at bedtime. (Patient taking differently: Take 400 mg by mouth as needed.) 90 capsule 1   Ketotifen Fumarate (ALAWAY OP) Apply to eye as needed (itchy eyes).     latanoprost  (XALATAN ) 0.005 % ophthalmic solution INT 1 GTT IN OD QD     metoprolol  succinate (TOPROL -XL) 25 MG 24 hr tablet TAKE 1 TABLET(25 MG) BY MOUTH DAILY 90 tablet 1   Multiple Vitamin (MULTIVITAMIN) tablet Take 1 tablet by mouth daily.     Multiple Vitamins-Minerals (HAIR SKIN & NAILS ADVANCED) TABS Take 1 mg by mouth daily.     Olopatadine HCl (PATADAY OP) Apply 1 drop to eye daily.     pantoprazole  (PROTONIX ) 40 MG tablet TAKE 1 TABLET BY MOUTH 1 TO 2 TIMES DAILY 180 tablet 0   Polyethyl Glycol-Propyl Glycol (SYSTANE OP) Apply to eye daily. Left eye only     traMADol -acetaminophen  (ULTRACET ) 37.5-325 MG tablet Take 1 tablet by mouth every 8 (eight) hours as needed. 270 tablet 0   XARELTO  10 MG TABS  tablet TAKE 1 TABLET(10 MG) BY MOUTH DAILY 90 tablet 0   colchicine  0.6 MG tablet Take 1 tablet (0.6 mg total) by mouth daily for 21 days. 21 tablet 0   Control Gel Formula Dressing (DUODERM CGF DRESSING) MISC Apply 20 each topically daily as needed. 20 each 5   Olopatadine HCl (PATADAY OP) Apply to eye.     Propylene Glycol (SYSTANE COMPLETE OP) Apply to eye.     No facility-administered medications prior to visit.    ROS Review of Systems  Objective:  BP (!) 170/92 (BP Location: Left Arm, Patient Position: Sitting, Cuff Size: Small)   Pulse 66   Temp 98.6 F (37 C) (Oral)   Ht 4\' 10"  (1.473 m)   Wt 115 lb 6.4 oz (52.3 kg)   SpO2 98%   BMI 24.12 kg/m   BP Readings from Last 3 Encounters:  05/07/24 (!) 170/92  04/02/24 (!) 140/78  02/21/24 132/62    Wt Readings from Last 3 Encounters:  05/07/24 115 lb 6.4 oz (52.3 kg)  04/02/24 116 lb 1.6 oz (52.7 kg)  02/20/24 124 lb (56.2 kg)    Physical Exam  Lab Results  Component Value Date   WBC 4.8 04/02/2024   HGB 11.4 (L) 04/02/2024   HCT 36.5 04/02/2024   PLT 364 04/02/2024  GLUCOSE 91 02/21/2024   CHOL 145 09/03/2023   TRIG 88.0 09/03/2023   HDL 69.40 09/03/2023   LDLCALC 58 09/03/2023   ALT 13 04/23/2023   AST 15 04/23/2023   NA 141 02/21/2024   K 4.1 02/21/2024   CL 105 02/21/2024   CREATININE 0.80 02/21/2024   BUN 13 02/21/2024   CO2 31 02/21/2024   TSH 1.526 04/02/2024   INR 1.3 (H) 04/23/2023   HGBA1C 5.4 05/30/2017    VAS US  LOWER EXTREMITY VENOUS REFLUX Result Date: 01/26/2024  Lower Venous Reflux Study Patient Name:  [name]  Date of Exam:   01/25/2024 Medical Rec #: [id number]    Accession #:    [id number] Date of Birth: 11/17/1933    Patient Gender: F Patient Age:   7 years Exam Location:  [facility] Procedure:      VAS US  LOWER EXTREMITY VENOUS REFLUX Referring Phys: Delaney Fearing --------------------------------------------------------------------------------  Indications:  Swelling.  Performing Technologist: Aloma Arrant RVS  Examination Guidelines: A complete evaluation includes B-mode imaging, spectral Doppler, color Doppler, and power Doppler as needed of all accessible portions of each vessel. Bilateral testing is considered an integral part of a complete examination. Limited examinations for reoccurring indications may be performed as noted. The reflux portion of the exam is performed with the patient in reverse Trendelenburg. Significant venous reflux is defined as >500 ms in the superficial venous system, and >1 second in the deep venous system.  +--------------+---------+------+-----------+------------+-------------+ LEFT          Reflux NoRefluxReflux TimeDiameter cmsComments                              Yes                                       +--------------+---------+------+-----------+------------+-------------+ CFV           no                                                  +--------------+---------+------+-----------+------------+-------------+ FV mid        no                                                  +--------------+---------+------+-----------+------------+-------------+ Popliteal     no                                                  +--------------+---------+------+-----------+------------+-------------+ GSV at SFJ    no                           0.549                  +--------------+---------+------+-----------+------------+-------------+ GSV prox thighno                            .323                  +--------------+---------+------+-----------+------------+-------------+  GSV mid thigh                               .330                  +--------------+---------+------+-----------+------------+-------------+ GSV dist thighno                            .225    out of fascia +--------------+---------+------+-----------+------------+-------------+ GSV at knee                                  .183    out of fascia +--------------+---------+------+-----------+------------+-------------+ GSV prox calf no                            .225    out of fascia +--------------+---------+------+-----------+------------+-------------+ SSV Pop Fossa no                            .175                  +--------------+---------+------+-----------+------------+-------------+ SSV prox calf no                            .191                  +--------------+---------+------+-----------+------------+-------------+   Summary: Left: - No evidence of deep vein thrombosis seen in the left lower extremity, from the common femoral through the popliteal veins. - No evidence of superficial venous thrombosis in the left lower extremity. - There is no evidence of venous reflux seen in the left lower extremity.  *See table(s) above for measurements and observations. Electronically signed by Angela Kell MD on 01/26/2024 at 3:59:22 PM.    Final     Assessment & Plan:  There are no diagnoses linked to this encounter.   Follow-up: No follow-ups on file.  Sandra Crouch, MD

## 2024-05-07 NOTE — Patient Instructions (Signed)
 Hypertension, Adult High blood pressure (hypertension) is when the force of blood pumping through the arteries is too strong. The arteries are the blood vessels that carry blood from the heart throughout the body. Hypertension forces the heart to work harder to pump blood and may cause arteries to become narrow or stiff. Untreated or uncontrolled hypertension can lead to a heart attack, heart failure, a stroke, kidney disease, and other problems. A blood pressure reading consists of a higher number over a lower number. Ideally, your blood pressure should be below 120/80. The first ("top") number is called the systolic pressure. It is a measure of the pressure in your arteries as your heart beats. The second ("bottom") number is called the diastolic pressure. It is a measure of the pressure in your arteries as the heart relaxes. What are the causes? The exact cause of this condition is not known. There are some conditions that result in high blood pressure. What increases the risk? Certain factors may make you more likely to develop high blood pressure. Some of these risk factors are under your control, including: Smoking. Not getting enough exercise or physical activity. Being overweight. Having too much fat, sugar, calories, or salt (sodium) in your diet. Drinking too much alcohol. Other risk factors include: Having a personal history of heart disease, diabetes, high cholesterol, or kidney disease. Stress. Having a family history of high blood pressure and high cholesterol. Having obstructive sleep apnea. Age. The risk increases with age. What are the signs or symptoms? High blood pressure may not cause symptoms. Very high blood pressure (hypertensive crisis) may cause: Headache. Fast or irregular heartbeats (palpitations). Shortness of breath. Nosebleed. Nausea and vomiting. Vision changes. Severe chest pain, dizziness, and seizures. How is this diagnosed? This condition is diagnosed by  measuring your blood pressure while you are seated, with your arm resting on a flat surface, your legs uncrossed, and your feet flat on the floor. The cuff of the blood pressure monitor will be placed directly against the skin of your upper arm at the level of your heart. Blood pressure should be measured at least twice using the same arm. Certain conditions can cause a difference in blood pressure between your right and left arms. If you have a high blood pressure reading during one visit or you have normal blood pressure with other risk factors, you may be asked to: Return on a different day to have your blood pressure checked again. Monitor your blood pressure at home for 1 week or longer. If you are diagnosed with hypertension, you may have other blood or imaging tests to help your health care provider understand your overall risk for other conditions. How is this treated? This condition is treated by making healthy lifestyle changes, such as eating healthy foods, exercising more, and reducing your alcohol intake. You may be referred for counseling on a healthy diet and physical activity. Your health care provider may prescribe medicine if lifestyle changes are not enough to get your blood pressure under control and if: Your systolic blood pressure is above 130. Your diastolic blood pressure is above 80. Your personal target blood pressure may vary depending on your medical conditions, your age, and other factors. Follow these instructions at home: Eating and drinking  Eat a diet that is high in fiber and potassium, and low in sodium, added sugar, and fat. An example of this eating plan is called the DASH diet. DASH stands for Dietary Approaches to Stop Hypertension. To eat this way: Eat  plenty of fresh fruits and vegetables. Try to fill one half of your plate at each meal with fruits and vegetables. Eat whole grains, such as whole-wheat pasta, brown rice, or whole-grain bread. Fill about one  fourth of your plate with whole grains. Eat or drink low-fat dairy products, such as skim milk or low-fat yogurt. Avoid fatty cuts of meat, processed or cured meats, and poultry with skin. Fill about one fourth of your plate with lean proteins, such as fish, chicken without skin, beans, eggs, or tofu. Avoid pre-made and processed foods. These tend to be higher in sodium, added sugar, and fat. Reduce your daily sodium intake. Many people with hypertension should eat less than 1,500 mg of sodium a day. Do not drink alcohol if: Your health care provider tells you not to drink. You are pregnant, may be pregnant, or are planning to become pregnant. If you drink alcohol: Limit how much you have to: 0-1 drink a day for women. 0-2 drinks a day for men. Know how much alcohol is in your drink. In the [place] one drink equals one 12 oz bottle of beer (355 mL), one 5 oz glass of wine (148 mL), or one 1 oz glass of hard liquor (44 mL). Lifestyle  Work with your health care provider to maintain a healthy body weight or to lose weight. Ask what an ideal weight is for you. Get at least 30 minutes of exercise that causes your heart to beat faster (aerobic exercise) most days of the week. Activities may include walking, swimming, or biking. Include exercise to strengthen your muscles (resistance exercise), such as Pilates or lifting weights, as part of your weekly exercise routine. Try to do these types of exercises for 30 minutes at least 3 days a week. Do not use any products that contain nicotine or tobacco. These products include cigarettes, chewing tobacco, and vaping devices, such as e-cigarettes. If you need help quitting, ask your health care provider. Monitor your blood pressure at home as told by your health care provider. Keep all follow-up visits. This is important. Medicines Take over-the-counter and prescription medicines only as told by your health care provider. Follow directions carefully. Blood  pressure medicines must be taken as prescribed. Do not skip doses of blood pressure medicine. Doing this puts you at risk for problems and can make the medicine less effective. Ask your health care provider about side effects or reactions to medicines that you should watch for. Contact a health care provider if you: Think you are having a reaction to a medicine you are taking. Have headaches that keep coming back (recurring). Feel dizzy. Have swelling in your ankles. Have trouble with your vision. Get help right away if you: Develop a severe headache or confusion. Have unusual weakness or numbness. Feel faint. Have severe pain in your chest or abdomen. Vomit repeatedly. Have trouble breathing. These symptoms may be an emergency. Get help right away. Call 911. Do not wait to see if the symptoms will go away. Do not drive yourself to the hospital. Summary Hypertension is when the force of blood pumping through your arteries is too strong. If this condition is not controlled, it may put you at risk for serious complications. Your personal target blood pressure may vary depending on your medical conditions, your age, and other factors. For most people, a normal blood pressure is less than 120/80. Hypertension is treated with lifestyle changes, medicines, or a combination of both. Lifestyle changes include losing weight, eating a healthy,  low-sodium diet, exercising more, and limiting alcohol. This information is not intended to replace advice given to you by your health care provider. Make sure you discuss any questions you have with your health care provider. Document Revised: 10/18/2021 Document Reviewed: 10/18/2021 Elsevier Patient Education  2024 [employer].

## 2024-05-14 DIAGNOSIS — H1131 Conjunctival hemorrhage, right eye: Secondary | ICD-10-CM | POA: Diagnosis not present

## 2024-05-14 DIAGNOSIS — H04123 Dry eye syndrome of bilateral lacrimal glands: Secondary | ICD-10-CM | POA: Diagnosis not present

## 2024-05-14 DIAGNOSIS — H1045 Other chronic allergic conjunctivitis: Secondary | ICD-10-CM | POA: Diagnosis not present

## 2024-05-14 DIAGNOSIS — H401131 Primary open-angle glaucoma, bilateral, mild stage: Secondary | ICD-10-CM | POA: Diagnosis not present

## 2024-05-14 DIAGNOSIS — Z961 Presence of intraocular lens: Secondary | ICD-10-CM | POA: Diagnosis not present

## 2024-05-14 DIAGNOSIS — H353131 Nonexudative age-related macular degeneration, bilateral, early dry stage: Secondary | ICD-10-CM | POA: Diagnosis not present

## 2024-05-21 ENCOUNTER — Encounter: Payer: Self-pay | Admitting: Podiatry

## 2024-05-21 ENCOUNTER — Ambulatory Visit (INDEPENDENT_AMBULATORY_CARE_PROVIDER_SITE_OTHER): Payer: Medicare Other | Admitting: Podiatry

## 2024-05-21 DIAGNOSIS — M79676 Pain in unspecified toe(s): Secondary | ICD-10-CM

## 2024-05-21 DIAGNOSIS — I872 Venous insufficiency (chronic) (peripheral): Secondary | ICD-10-CM

## 2024-05-21 DIAGNOSIS — B351 Tinea unguium: Secondary | ICD-10-CM

## 2024-05-21 DIAGNOSIS — D689 Coagulation defect, unspecified: Secondary | ICD-10-CM | POA: Diagnosis not present

## 2024-05-21 NOTE — Progress Notes (Signed)
 This patient returns to my office for at risk foot care.  This patient requires this care by a professional since this patient will be at risk due to having CKD.  This patient is unable to cut nails herself since the patient cannot reach her nails.These nails are painful walking and wearing shoes.  This patient presents for at risk foot care today.  General Appearance  Alert, conversant and in no acute stress.  Vascular  Dorsalis pedis and posterior tibial  pulses are  weakly palpable  bilaterally.  Capillary return is within normal limits  bilaterally. Temperature is within normal limits  bilaterally.  Neurologic  Senn-Weinstein monofilament wire test within normal limits  bilaterally. Muscle power within normal limits bilaterally.  Nails Thick disfigured discolored nails with subungual debris  from hallux to fifth toes bilaterally. No evidence of bacterial infection or drainage bilaterally.  Orthopedic  No limitations of motion  feet .  No crepitus or effusions noted.  No bony pathology or digital deformities noted.  HAV  B/L.  Hammer toe 2 B/L.  Skin  normotropic skin with no porokeratosis noted bilaterally.  No signs of infections or ulcers noted.   Diffuse asymptomatic callus  right foot.  Onychomycosis  Pain in right toes  Pain in left toes  Consent was obtained for treatment procedures.   Mechanical debridement of nails 1-5  bilaterally performed with a nail nipper.  Filed with dremel without incident.    Return office visit    3 months                  Told patient to return for periodic foot care and evaluation due to potential at risk complications.   Ruffin Cotton DPM

## 2024-05-30 ENCOUNTER — Ambulatory Visit (INDEPENDENT_AMBULATORY_CARE_PROVIDER_SITE_OTHER)

## 2024-05-30 VITALS — BP 128/80 | HR 70 | Wt 117.2 lb

## 2024-05-30 DIAGNOSIS — Z0000 Encounter for general adult medical examination without abnormal findings: Secondary | ICD-10-CM

## 2024-05-30 NOTE — Patient Instructions (Addendum)
 [name] , Thank you for taking time out of your busy schedule to complete your Annual Wellness Visit with me. I enjoyed our conversation and look forward to speaking with you again next year. I, as well as your care team,  appreciate your ongoing commitment to your health goals. Please review the following plan we discussed and let me know if I can assist you in the future. Your Game plan/ To Do List    Follow up Visits: Next Medicare AWV with our clinical staff:  06/03/2025. Have you seen your provider in the last 6 months (3 months if uncontrolled diabetes)? Yes Next Office Visit with your provider: 11/12/2024.  Clinician Recommendations:  Aim for 30 minutes of exercise or brisk walking, 6-8 glasses of water, and 5 servings of fruits and vegetables each day.       This is a list of the screening recommended for you and due dates:  Health Maintenance  Topic Date Due   COVID-19 Vaccine (5 - 2024-25 season) 08/26/2023   DTaP/Tdap/Td vaccine (3 - Td or Tdap) 04/28/2024   Medicare Annual Wellness Visit  05/27/2024   Flu Shot  07/25/2024   Pneumonia Vaccine  Completed   DEXA scan (bone density measurement)  Completed   Zoster (Shingles) Vaccine  Completed   HPV Vaccine  Aged Out   Meningitis B Vaccine  Aged Out    Advanced directives: (Copy Requested) Please bring a copy of your health care power of attorney and living will to the office to be added to your chart at your convenience. You can mail to [facility] [address] [place], [place] [address] or email to [email or web address] Advance Care Planning is important because it:  [x]  Makes sure you receive the medical care that is consistent with your values, goals, and preferences  [x]  It provides guidance to your family and loved ones and reduces their decisional burden about whether or not they are making the right decisions based on your wishes.  Follow the link provided in your after visit summary or read  over the paperwork we have mailed to you to help you started getting your Advance Directives in place. If you need assistance in completing these, please reach out to us  so that we can help you!  See attachments for Preventive Care and Fall Prevention Tips.

## 2024-05-30 NOTE — Progress Notes (Signed)
 Subjective:   [name] is a 89 y.o. who presents for a Medicare Wellness preventive visit.  As a reminder, Annual Wellness Visits don't include a physical exam, and some assessments may be limited, especially if this visit is performed virtually. We may recommend an in-person follow-up visit with your provider if needed.  Visit Complete: In person  Persons Participating in Visit: Patient assisted by her neice.  AWV Questionnaire: No: Patient Medicare AWV questionnaire was not completed prior to this visit.  Cardiac Risk Factors include: advanced age (>73men, >52 women);sedentary lifestyle     Objective:     Today's Vitals   05/30/24 1606 05/30/24 1607  BP:  128/80  Pulse:  70  SpO2:  100%  Weight: 117 lb 3.2 oz (53.2 kg) 117 lb 3.2 oz (53.2 kg)  PainSc:  5    Body mass index is 24.49 kg/m.     05/30/2024    4:17 PM 05/28/2023   10:14 AM 04/23/2023   10:43 AM 05/02/2022    2:50 PM 12/11/2020   11:28 AM 04/01/2020   11:12 AM 07/23/2019   11:26 AM  Advanced Directives  Does Patient Have a Medical Advance Directive? Yes Yes Yes Yes No No No  Type of Estate agent of [place];Living will Healthcare Power of Attorney  Living will;Healthcare Power of Attorney     Does patient want to make changes to medical advance directive? No - Patient declined   No - Patient declined     Copy of Healthcare Power of Attorney in Chart? Yes - validated most recent copy scanned in chart (See row information) No - copy requested  Yes - validated most recent copy scanned in chart (See row information)     Would patient like information on creating a medical advance directive?     No - Patient declined Yes (ED - Information included in AVS) No - Patient declined    Current Medications (verified) Outpatient Encounter Medications as of 05/30/2024  Medication Sig   Docusate Calcium (STOOL SOFTENER PO) Take by mouth as needed.   Ferrous Sulfate  (IRON PO) Take by mouth daily.    gabapentin  (NEURONTIN ) 400 MG capsule Take 1 capsule (400 mg total) by mouth at bedtime. (Patient taking differently: Take 400 mg by mouth as needed.)   Ketotifen Fumarate (ALAWAY OP) Apply to eye as needed (itchy eyes).   latanoprost  (XALATAN ) 0.005 % ophthalmic solution INT 1 GTT IN OD QD   metoprolol  succinate (TOPROL -XL) 25 MG 24 hr tablet TAKE 1 TABLET(25 MG) BY MOUTH DAILY   Multiple Vitamin (MULTIVITAMIN) tablet Take 1 tablet by mouth daily.   Multiple Vitamins-Minerals (HAIR SKIN & NAILS ADVANCED) TABS Take 1 mg by mouth daily.   Olopatadine HCl (PATADAY OP) Apply 1 drop to eye daily.   pantoprazole  (PROTONIX ) 40 MG tablet TAKE 1 TABLET BY MOUTH 1 TO 2 TIMES DAILY   Polyethyl Glycol-Propyl Glycol (SYSTANE OP) Apply to eye daily. Left eye only   traMADol -acetaminophen  (ULTRACET ) 37.5-325 MG tablet Take 1 tablet by mouth every 8 (eight) hours as needed.   XARELTO  10 MG TABS tablet TAKE 1 TABLET(10 MG) BY MOUTH DAILY   No facility-administered encounter medications on file as of 05/30/2024.    Allergies (verified) Patient has no known allergies.   History: Past Medical History:  Diagnosis Date   Abnormal finding on Pap smear, ASCUS    Acute renal insufficiency    Amputation finger    index   Barrett's esophagus  CAD (coronary artery disease)    Dementia (HCC)    Diverticulosis    Elevated BP    Foot deformity    GERD (gastroesophageal reflux disease)    Glaucoma    H/O: GI bleed    HSV-2 (herpes simplex virus 2) infection    Hyperglycemia    Hypertension    Migraine    Osteoporosis    worsening   Spondylosis    Past Surgical History:  Procedure Laterality Date   AMPUTATION FINGER / THUMB     secondary to osteomyelitis   BIOPSY  04/24/2023   Procedure: BIOPSY;  Surgeon: Annis Kinder, DO;  Location: [facility] ENDOSCOPY;  Service: Gastroenterology;;   CATARACT EXTRACTION, BILATERAL Bilateral 12/25/2016   COLONOSCOPY     ESOPHAGOGASTRODUODENOSCOPY N/A 10/22/2016    Procedure: ESOPHAGOGASTRODUODENOSCOPY (EGD);  Surgeon: Albertina Hugger, MD;  Location: [facility] ENDOSCOPY;  Service: Endoscopy;  Laterality: N/A;   ESOPHAGOGASTRODUODENOSCOPY N/A 04/24/2023   Procedure: ESOPHAGOGASTRODUODENOSCOPY (EGD);  Surgeon: Annis Kinder, DO;  Location: [facility] ENDOSCOPY;  Service: Gastroenterology;  Laterality: N/A;   FOOT SURGERY     right   PARTIAL HYSTERECTOMY     UPPER GASTROINTESTINAL ENDOSCOPY     Family History  Problem Relation Age of Onset   Breast cancer Mother    Heart attack Mother    Heart disease Father    Alcohol  abuse Son    Colon cancer Neg Hx    Stomach cancer Neg Hx    Rectal cancer Neg Hx    Social History   Socioeconomic History   Marital status: Single    Spouse name: Not on file   Number of children: 1   Years of education: Not on file   Highest education level: 12th grade  Occupational History    Employer: RETIRED  Tobacco Use   Smoking status: Never   Smokeless tobacco: Never  Vaping Use   Vaping status: Never Used  Substance and Sexual Activity   Alcohol  use: No    Alcohol /week: 0.0 standard drinks of alcohol    Drug use: No   Sexual activity: Never    Birth control/protection: Post-menopausal  Other Topics Concern   Not on file  Social History Narrative   Marital status: divorced.      Children: 1 son; no grandchildren      Lives: alone/2025 but has a caregiver      Employment: retired      Tobacco: none      Alcohol : none      Drugs: none       Exercise: none       ADLs:  Independent with ADLs.  Quit driving age 50.  Medical CNA cleans house; makes breakfast; patient pays bills.      Advanced Directives:   YES.  HCPOA:  Son/Isaac Bowman.  FULL CODE; no prolonged measures.   Social Drivers of Corporate investment banker Strain: High Risk (05/30/2024)   Overall Financial Resource Strain (CARDIA)    Difficulty of Paying Living Expenses: Hard  Food Insecurity: No Food Insecurity (05/30/2024)   Hunger Vital Sign     Worried About Running Out of Food in the Last Year: Never true    Ran Out of Food in the Last Year: Never true  Transportation Needs: No Transportation Needs (05/30/2024)   PRAPARE - Administrator, Civil Service (Medical): No    Lack of Transportation (Non-Medical): No  Physical Activity: Inactive (05/30/2024)   Exercise Vital Sign  Days of Exercise per Week: 0 days    Minutes of Exercise per Session: 0 min  Stress: Stress Concern Present (05/30/2024)   [employer] of Occupational Health - Occupational Stress Questionnaire    Feeling of Stress : To some extent  Social Connections: Socially Isolated (05/30/2024)   Social Connection and Isolation Panel [NHANES]    Frequency of Communication with Friends and Family: More than three times a week    Frequency of Social Gatherings with Friends and Family: More than three times a week    Attends Religious Services: Never    Database administrator or Organizations: No    Attends Banker Meetings: Never    Marital Status: Widowed    Tobacco Counseling Counseling given: Not Answered    Clinical Intake:  Pre-visit preparation completed: Yes  Pain : 0-10 Pain Score: 5  Pain Type: Chronic pain Pain Location: Shoulder Pain Descriptors / Indicators: Aching, Discomfort Pain Frequency: Constant Pain Relieving Factors: Tramadol  Effect of Pain on Daily Activities: When moving arms and getting up and down from chair.  Pain Relieving Factors: Tramadol      Lab Results  Component Value Date   HGBA1C 5.4 05/30/2017     How often do you need to have someone help you when you read instructions, pamphlets, or other written materials from your doctor or pharmacy?: 1 - Never  Interpreter Needed?: No  Information entered by :: Akim Watkinson, RMA   Activities of Daily Living     05/30/2024    4:11 PM  In your present state of health, do you have any difficulty performing the following activities:  Hearing? 1   Vision? 1  Difficulty concentrating or making decisions? 1  Walking or climbing stairs? 1  Dressing or bathing? 1  Comment care giver helps  Doing errands, shopping? 1  Comment Niece takes her around  Quarry manager and eating ? Y  Comment meals on wheels  Using the Toilet? Y  In the past six months, have you accidently leaked urine? Y  Do you have problems with loss of bowel control? Y  Managing your Medications? Y  Managing your Finances? Y  Housekeeping or managing your Housekeeping? Y    Patient Care Team: Arcadio Knuckles, MD as PCP - General (Internal Medicine) Elmyra Haggard, MD (Cardiology) Eduardo Grade, MD (Dermatology) Claudette Cue, MD (Inactive) (Gastroenterology) Derry Flock, MD (Inactive) (Obstetrics and Gynecology) Maris Sickle, MD (Ophthalmology) Ronn Cohn, MD (Orthopedic Surgery)  I have updated your Care Teams any recent Medical Services you may have received from other providers in the past year.     Assessment:    This is a routine wellness examination for Kahley.  Hearing/Vision screen Hearing Screening - Comments:: Denies hearing difficulties   Vision Screening - Comments:: Wears eyeglasses/Dr . Candi Chafe   Goals Addressed   None    Depression Screen     05/30/2024    4:23 PM 02/01/2024    4:01 PM 05/28/2023   10:14 AM 05/10/2023   10:45 AM 09/20/2022    8:16 AM 05/02/2022    2:52 PM 04/19/2022    1:41 PM  PHQ 2/9 Scores  PHQ - 2 Score 0 0 0 0 0 0 0  PHQ- 9 Score 0          Fall Risk     05/30/2024    4:18 PM 02/01/2024    4:07 PM 05/28/2023    9:50 AM 05/10/2023   10:45 AM  09/20/2022    8:16 AM  Fall Risk   Falls in the past year? 0 0 0 0 0  Number falls in past yr: 0 0 0 0 0  Injury with Fall? 0 0 0 0 0  Risk for fall due to :  No Fall Risks No Fall Risks No Fall Risks No Fall Risks  Risk for fall due to: Comment   cane and walker    Follow up Falls evaluation completed;Falls prevention discussed Falls evaluation completed  Falls evaluation completed Falls evaluation completed Falls evaluation completed    MEDICARE RISK AT HOME:  Medicare Risk at Home Any stairs in or around the home?: No If so, are there any without handrails?: No Home free of loose throw rugs in walkways, pet beds, electrical cords, etc?: Yes Adequate lighting in your home to reduce risk of falls?: Yes Life alert?: No Use of a cane, walker or w/c?: Yes Grab bars in the bathroom?: Yes Shower chair or bench in shower?: Yes Elevated toilet seat or a handicapped toilet?: Yes  TIMED UP AND GO:  Was the test performed?  Yes  Length of time to ambulate 10 feet: 30 sec Gait slow and steady with assistive device  Cognitive Function: 6CIT completed    05/02/2022    2:52 PM  MMSE - Mini Mental State Exam  Not completed: Unable to complete        05/30/2024    4:19 PM 05/28/2023   10:18 AM 04/01/2020   11:12 AM  6CIT Screen  What Year? 0 points 0 points 0 points  What month?  0 points 0 points  What time? 0 points 3 points 0 points  Count back from 20 0 points 4 points 0 points  Months in reverse 0 points 4 points 0 points  Repeat phrase 0 points 10 points 2 points  Total Score  21 points 2 points    Immunizations Immunization History  Administered Date(s) Administered   Fluad Quad(high Dose 65+) 10/05/2020, 09/26/2022   Influenza Split 10/12/2011   Influenza Whole 09/20/2012   Influenza, High Dose Seasonal PF 02/21/2019, 08/26/2019   Influenza,inj,Quad PF,6+ Mos 10/07/2013, 09/03/2014, 08/26/2015, 10/11/2016   Influenza-Unspecified 08/25/2017, 09/30/2021, 10/19/2023   Moderna Sars-Covid-2 Vaccination 02/06/2020, 03/05/2020   PFIZER(Purple Top)SARS-COV-2 Vaccination 11/12/2020   PPD Test 10/16/2018   Pfizer Covid-19 Vaccine Bivalent Booster 73yrs & up 09/30/2021   Pneumococcal Conjugate-13 07/21/2014   Pneumococcal Polysaccharide-23 12/24/2001, 12/24/2006, 11/04/2016   Tdap 07/15/2007, 04/28/2014   Zoster Recombinant(Shingrix)  01/02/2018, 10/06/2019   Zoster, Live 01/02/2018    Screening Tests Health Maintenance  Topic Date Due   COVID-19 Vaccine (5 - 2024-25 season) 08/26/2023   DTaP/Tdap/Td (3 - Td or Tdap) 04/28/2024   Medicare Annual Wellness (AWV)  05/27/2024   INFLUENZA VACCINE  07/25/2024   Pneumonia Vaccine 83+ Years old  Completed   DEXA SCAN  Completed   Zoster Vaccines- Shingrix  Completed   HPV VACCINES  Aged Out   Meningococcal B Vaccine  Aged Out    Health Maintenance  Health Maintenance Due  Topic Date Due   COVID-19 Vaccine (5 - 2024-25 season) 08/26/2023   DTaP/Tdap/Td (3 - Td or Tdap) 04/28/2024   Medicare Annual Wellness (AWV)  05/27/2024   Health Maintenance Items Addressed: See Nurse Notes at the end of this note  Additional Screening:  Vision Screening: Recommended annual ophthalmology exams for early detection of glaucoma and other disorders of the eye. Would you like a referral to an eye  doctor? No    Dental Screening: Recommended annual dental exams for proper oral hygiene  Community Resource Referral / Chronic Care Management: CRR required this visit?  No   CCM required this visit?  No   Plan:    I have personally reviewed and noted the following in the patient's chart:   Medical and social history Use of alcohol , tobacco or illicit drugs  Current medications and supplements including opioid prescriptions. Patient is currently taking opioid prescriptions. Information provided to patient regarding non-opioid alternatives. Patient advised to discuss non-opioid treatment plan with their provider. Functional ability and status Nutritional status Physical activity Advanced directives List of other physicians Hospitalizations, surgeries, and ER visits in previous 12 months Vitals Screenings to include cognitive, depression, and falls Referrals and appointments  In addition, I have reviewed and discussed with patient certain preventive protocols, quality  metrics, and best practice recommendations. A written personalized care plan for preventive services as well as general preventive health recommendations were provided to patient.   Oluwaferanmi Wain L Kaiesha Tonner, CMA   05/30/2024   After Visit Summary: (MyChart) Due to this being a telephonic visit, the after visit summary with patients personalized plan was offered to patient via MyChart   Notes: Please refer to Routing Comments.

## 2024-06-18 ENCOUNTER — Ambulatory Visit (INDEPENDENT_AMBULATORY_CARE_PROVIDER_SITE_OTHER): Payer: Medicare Other | Admitting: Otolaryngology

## 2024-06-18 ENCOUNTER — Encounter (INDEPENDENT_AMBULATORY_CARE_PROVIDER_SITE_OTHER): Payer: Self-pay | Admitting: Otolaryngology

## 2024-06-18 VITALS — BP 147/80 | HR 88 | Wt 117.0 lb

## 2024-06-18 DIAGNOSIS — H6123 Impacted cerumen, bilateral: Secondary | ICD-10-CM | POA: Diagnosis not present

## 2024-06-18 DIAGNOSIS — H903 Sensorineural hearing loss, bilateral: Secondary | ICD-10-CM | POA: Diagnosis not present

## 2024-06-18 DIAGNOSIS — H9041 Sensorineural hearing loss, unilateral, right ear, with unrestricted hearing on the contralateral side: Secondary | ICD-10-CM

## 2024-06-19 NOTE — Progress Notes (Signed)
 Patient ID: [name], female   DOB: 1933-05-31, 89 y.o.   MRN: [id number]  Follow-up: Asymmetric right ear hearing loss   HPI: The patient is an 89 year old female who returns today with her caregiver.  The patient has a history of bilateral high-frequency sensorineural hearing loss, worse on the right side.  The small possibility of a retrocochlear lesion causing her asymmetric hearing loss was discussed.  Due to her advanced age, the patient has elected to proceed with conservative observation instead of MRI scan.  The patient returns today reporting no significant change in her hearing.  She also has a history of recurrent cerumen impaction.  Exam: General: Communicates without difficulty, well nourished, no acute distress. Head: Normocephalic, no evidence injury, no tenderness, facial buttresses intact without stepoff. Face/sinus: No tenderness to palpation and percussion. Facial movement is normal and symmetric. Eyes: PERRL, EOMI. No scleral icterus, conjunctivae clear. Neuro: CN II exam reveals vision grossly intact.  No nystagmus at any point of gaze. Ears: Auricles well formed without lesions.  Bilateral cerumen impaction.  Nose: External evaluation reveals normal support and skin without lesions.  Dorsum is intact.  Anterior rhinoscopy reveals congested mucosa over anterior aspect of inferior turbinates and intact septum.  No purulence noted. Oral:  Oral cavity and oropharynx are intact, symmetric, without erythema or edema.  Mucosa is moist without lesions. Neck: Full range of motion without pain.  There is no significant lymphadenopathy.  No masses palpable.  Thyroid  bed within normal limits to palpation.  Parotid glands and submandibular glands equal bilaterally without mass.  Trachea is midline. Neuro:  CN 2-12 grossly intact.   Procedure: Bilateral cerumen disimpaction Anesthesia: None Description: Under the operating microscope, the cerumen is carefully removed with a combination of  cerumen currette, alligator forceps, and suction catheters.  After the cerumen is removed, the TMs are noted to be normal.  No mass, erythema, or lesions. The patient tolerated the procedure well.    Assessment: 1.  Subjectively stable bilateral high-frequency sensorineural hearing loss, worse on the right side. 2.  Bilateral cerumen impaction. 3.  Her ear canals, tympanic membranes, and middle ear spaces are otherwise normal.  Plan: 1.  The physical exam findings are reviewed with the patient and her caregiver. 2.  Otomicroscopy with bilateral cerumen disimpaction. 3.  The patient is a candidate for hearing amplification. 4.  The patient will return for reevaluation in 6 months.

## 2024-06-24 ENCOUNTER — Telehealth: Payer: Self-pay | Admitting: Internal Medicine

## 2024-06-24 NOTE — Telephone Encounter (Signed)
 Copied from CRM [id number]. Topic: Clinical - Medication Question >> Jun 24, 2024  3:26 PM [name] wrote: Reason for CRM: Patient's niece called in stating she received an text from [facility]  Stating the prescription was ready (without medication listed) advised I don't show any documentation of a prescription being sent in. Please confirm with [place] if there has been or not. Please call [phone]

## 2024-06-26 NOTE — Telephone Encounter (Signed)
 [name] has been made aware that we haven't sent anything to [facility] and disregard the call. She gave a verbal understanding.

## 2024-08-01 ENCOUNTER — Other Ambulatory Visit: Payer: Self-pay | Admitting: Internal Medicine

## 2024-08-01 DIAGNOSIS — D6869 Other thrombophilia: Secondary | ICD-10-CM

## 2024-08-01 DIAGNOSIS — K219 Gastro-esophageal reflux disease without esophagitis: Secondary | ICD-10-CM

## 2024-08-01 DIAGNOSIS — I82511 Chronic embolism and thrombosis of right femoral vein: Secondary | ICD-10-CM

## 2024-08-20 ENCOUNTER — Encounter: Payer: Self-pay | Admitting: Podiatry

## 2024-08-20 ENCOUNTER — Ambulatory Visit (INDEPENDENT_AMBULATORY_CARE_PROVIDER_SITE_OTHER): Payer: Medicare Other | Admitting: Podiatry

## 2024-08-20 DIAGNOSIS — L84 Corns and callosities: Secondary | ICD-10-CM

## 2024-08-20 DIAGNOSIS — M79676 Pain in unspecified toe(s): Secondary | ICD-10-CM | POA: Diagnosis not present

## 2024-08-20 DIAGNOSIS — S99821A Other specified injuries of right foot, initial encounter: Secondary | ICD-10-CM

## 2024-08-20 DIAGNOSIS — B351 Tinea unguium: Secondary | ICD-10-CM

## 2024-08-20 DIAGNOSIS — L988 Other specified disorders of the skin and subcutaneous tissue: Secondary | ICD-10-CM | POA: Diagnosis not present

## 2024-08-20 DIAGNOSIS — D689 Coagulation defect, unspecified: Secondary | ICD-10-CM

## 2024-08-23 NOTE — Progress Notes (Signed)
 Subjective:  Patient ID: [name], female    DOB: Jan 16, 1933,  MRN: [id number]  Chatara Lucente presents to clinic today for at risk foot care with h/o clotting disorder and corn(s) right foot and painful mycotic toenails that are difficult to trim. Painful toenails interfere with ambulation. Aggravating factors include wearing enclosed shoe gear. Pain is relieved with periodic professional debridement. Painful corns are aggravated when weightbearing when wearing enclosed shoe gear. Pain is relieved with periodic professional debridement. She is accompanied by her niece on today's visit.  Chief Complaint  Patient presents with   RFC     RFC Non diabetic toenail trim LOV with PCP 06/2024.   PCP is Joshua Debby CROME, MD.  No Known Allergies  Review of Systems: Negative except as noted in the HPI.  Objective: No changes noted in today's physical examination. There were no vitals filed for this visit. [name] is a pleasant 89 y.o. female frail, in NAD. AAO x 3.  Vascular Examination: CFT <3 seconds b/l. DP/PT pulses faintly palpable b/l. Digital hair absent.  Skin temperature gradient warm to warm b/l. No pain with calf compression. No ischemia or gangrene. No cyanosis or clubbing noted b/l. Dependent edema noted b/l LE.   Neurological Examination: Sensation grossly intact b/l with 10 gram monofilament. Vibratory sensation intact b/l.   Dermatological Examination: Pedal skin warm and supple b/l.   No open wounds. No interdigital macerations.  Toenails left hallux and 2-5 b/l thick, discolored, elongated with subungual debris and pain on dorsal palpation.    Right great toenail mycotic nailplate with subungual maceration of skin extending to distal tip of digit. There is red, granular tissue at tip. No erythema, no edema, no drainage, no fluctuance.  Hyperkeratotic lesion(s) dorsal PIPJ of right second digit.  No erythema, no edema, no drainage, no fluctuance.  Musculoskeletal  Examination: Muscle strength 4/5 to all lower extremity muscle groups bilaterally. HAV with bunion bilaterally and hammertoes 2-5 b/l. Utilizes rollator for ambulation assistance.  Radiographs: None  Assessment/Plan: 1. Pain due to onychomycosis of toenail   2. Corns   3. Clotting disorder (HCC)   4. Maceration of skin   5. Subungual injury of toe of right foot, initial encounter   -Patient's family member present. All questions/concerns addressed on today's visit. -Consent given for treatment as described below: -Loose macerated skin gently debrided with tissue nipper. Betadine solution applied to digit with fabric band-aid. Niece instructed to apply Neosporin Cream to right great toe. Avoid ointments or occlusive topicals.. -Patient to continue soft, supportive shoe gear daily. -Mycotic toenails 1-5 bilaterally were debrided in length and girth with sterile nail nippers and dremel without iatrogenic bleeding. -Corn(s) dorsal PIPJ of R 2nd toe pared utilizing sharp debridement with sterile blade without complication or incident. Total number debrided=1. -Patient/POA to call should there be question/concern in the interim.   Return in about 3 months (around 11/20/2024).  Delon CROME Merlin, DPM      [place] LOCATION: [address], [place] [address]                   Office [phone]   [facility] LOCATION: [address] [place], [place] [address] Office [phone]

## 2024-08-27 DIAGNOSIS — Z1231 Encounter for screening mammogram for malignant neoplasm of breast: Secondary | ICD-10-CM | POA: Diagnosis not present

## 2024-08-27 LAB — HM MAMMOGRAPHY

## 2024-08-29 ENCOUNTER — Encounter: Payer: Self-pay | Admitting: Internal Medicine

## 2024-09-25 NOTE — Telephone Encounter (Signed)
 Error

## 2024-10-01 DIAGNOSIS — H04123 Dry eye syndrome of bilateral lacrimal glands: Secondary | ICD-10-CM | POA: Diagnosis not present

## 2024-10-01 DIAGNOSIS — H401131 Primary open-angle glaucoma, bilateral, mild stage: Secondary | ICD-10-CM | POA: Diagnosis not present

## 2024-10-01 DIAGNOSIS — Z961 Presence of intraocular lens: Secondary | ICD-10-CM | POA: Diagnosis not present

## 2024-10-01 DIAGNOSIS — H353131 Nonexudative age-related macular degeneration, bilateral, early dry stage: Secondary | ICD-10-CM | POA: Diagnosis not present

## 2024-10-01 DIAGNOSIS — H1045 Other chronic allergic conjunctivitis: Secondary | ICD-10-CM | POA: Diagnosis not present

## 2024-10-02 ENCOUNTER — Encounter: Payer: Self-pay | Admitting: Internal Medicine

## 2024-10-02 ENCOUNTER — Ambulatory Visit: Admitting: Internal Medicine

## 2024-10-02 VITALS — BP 146/68 | HR 74 | Temp 98.5°F | Resp 16 | Ht <= 58 in

## 2024-10-02 DIAGNOSIS — I872 Venous insufficiency (chronic) (peripheral): Secondary | ICD-10-CM | POA: Diagnosis not present

## 2024-10-02 DIAGNOSIS — D508 Other iron deficiency anemias: Secondary | ICD-10-CM

## 2024-10-02 DIAGNOSIS — I10 Essential (primary) hypertension: Secondary | ICD-10-CM | POA: Diagnosis not present

## 2024-10-02 LAB — IBC + FERRITIN
Ferritin: 23.5 ng/mL (ref 10.0–291.0)
Iron: 71 ug/dL (ref 42–145)
Saturation Ratios: 24.4 % (ref 20.0–50.0)
TIBC: 291.2 ug/dL (ref 250.0–450.0)
Transferrin: 208 mg/dL — ABNORMAL LOW (ref 212.0–360.0)

## 2024-10-02 LAB — BASIC METABOLIC PANEL WITH GFR
BUN: 13 mg/dL (ref 6–23)
CO2: 33 meq/L — ABNORMAL HIGH (ref 19–32)
Calcium: 8.9 mg/dL (ref 8.4–10.5)
Chloride: 105 meq/L (ref 96–112)
Creatinine, Ser: 0.81 mg/dL (ref 0.40–1.20)
GFR: 64.53 mL/min (ref >60.00)
Glucose, Bld: 94 mg/dL (ref 70–99)
Potassium: 3.8 meq/L (ref 3.5–5.1)
Sodium: 144 meq/L (ref 135–145)

## 2024-10-02 LAB — CBC WITH DIFFERENTIAL/PLATELET
Basophils Absolute: 0 K/uL (ref 0.0–0.1)
Basophils Relative: 0.7 % (ref 0.0–3.0)
Eosinophils Absolute: 0.2 K/uL (ref 0.0–0.7)
Eosinophils Relative: 2.2 % (ref 0.0–5.0)
HCT: 35.9 % — ABNORMAL LOW (ref 36.0–46.0)
Hemoglobin: 11.7 g/dL — ABNORMAL LOW (ref 12.0–15.0)
Lymphocytes Relative: 8.8 % — ABNORMAL LOW (ref 12.0–46.0)
Lymphs Abs: 0.6 K/uL — ABNORMAL LOW (ref 0.7–4.0)
MCHC: 32.7 g/dL (ref 30.0–36.0)
MCV: 85.4 fl (ref 78.0–100.0)
Monocytes Absolute: 0.4 K/uL (ref 0.1–1.0)
Monocytes Relative: 6.3 % (ref 3.0–12.0)
Neutro Abs: 5.6 K/uL (ref 1.4–7.7)
Neutrophils Relative %: 82 % — ABNORMAL HIGH (ref 43.0–77.0)
Platelets: 351 K/uL (ref 150.0–400.0)
RBC: 4.21 Mil/uL (ref 3.87–5.11)
RDW: 14.9 % (ref 11.5–15.5)
WBC: 6.9 K/uL (ref 4.0–10.5)

## 2024-10-02 MED ORDER — VASCULERA PO TABS: 1.0000 | ORAL_TABLET | Freq: Every day | ORAL | Status: DC

## 2024-10-02 MED ORDER — CLOBETASOL PROPIONATE 0.05 % EX CREA: 1.0000 | TOPICAL_CREAM | Freq: Two times a day (BID) | CUTANEOUS | 3 refills | Status: AC

## 2024-10-02 MED ORDER — VASCULERA PO TABS: 1.0000 | ORAL_TABLET | Freq: Every day | ORAL | 11 refills | Status: AC

## 2024-10-02 NOTE — Patient Instructions (Signed)
Stasis Dermatitis Stasis dermatitis is a long-term (chronic) skin condition that happens when you have poor circulation. This is when your veins can no longer pump blood back to the heart like they should. This condition causes a red or brown scaly rash or sores (ulcers). It often affects the lower legs. It may affect one leg or both legs. Without treatment, this condition can lead to other skin problems. What are the causes? This condition is caused by poor circulation. What increases the risk? You are more likely to get this condition if: You are not very active. You have to stand for long periods. You have varicose veins. These are veins that are enlarged and twisted. You have venous insufficiency. This is when your leg veins struggle to send blood back to your heart. You have been pregnant many times. You have had vein surgery or injuries to your legs in the past. You have heart or kidney failure. You have had a blood clot. You are 88 years of age or older and are overweight. What are the signs or symptoms? Common early symptoms of this condition include: Itching in one or both of your legs. Swelling in your ankle or leg. This may get better at night but be worse during the day. Skin that looks thin on your ankle and leg. Skin that is dry, cracked, or easily irritated. Red or brown marks that form slowly. Red, swollen skin that burns or is sore. An achy or heavy feeling after you walk or stand for a long time. Pain. Later and more severe symptoms of this condition include: Skin that looks shiny. Ulcers. These are often red or purple and leak fluid. Skin that feels hard. Severe itching. A change in the shape or color of your lower legs. Severe pain. Trouble walking. How is this diagnosed? This condition may be diagnosed based on your symptoms, medical history, and a physical exam. You may also have tests. These may include: Blood tests. Doppler ultrasound. This can check blood  flow. Allergy tests. You may need to see a health care provider who is an expert in skin diseases (dermatologist). How is this treated? This condition may be treated with: Compression stockings or an elastic wrap. Medicines, such as: Corticosteroid creams and ointments. Other medicines applied to the skin. Medicines to reduce swelling in the legs (diuretics). Antibiotics. Medicines to help with itching (antihistamines). A bandage (dressing). A wrap that has zinc and gelatin on it (Unna boot). Follow these instructions at home: Medicines Take or use over-the-counter and prescription medicines only as told by your provider. If you were prescribed antibiotics, take or use them as told by your provider. Do not stop taking or using the antibiotic even if you start to feel better. Skin care Moisturize your skin as told by your provider. Do not use scented moisturizers, soaps, detergents, or perfumes. These can irritate your skin. Apply a cool, wet cloth (cool compress) to the affected areas. Do not scratch your skin. Do not rub your skin dry after a bath or shower. Gently pat your skin dry. Check the affected areas every day for signs of infection. Check for: More redness, swelling, or pain. More fluid or blood. Warmth. Pus or a bad smell. Activity Walk as told by your provider. Walking increases blood flow. Do calf and ankle exercises during the day as told by your provider. This will help increase blood flow. Raise (elevate) your legs above the level of your heart while you are sitting or lying down.  Lifestyle Work with your provider to lose weight, if needed. Do not cross your legs when you sit. Do not stand or sit in one position for a long time. Wear comfortable, loose-fitting clothes. Circulation in your legs will be worse if you wear tight pants, belts, and waistbands. Do not use any products that contain nicotine or tobacco. These products include cigarettes, chewing tobacco,  and vaping devices, such as e-cigarettes. If you need help quitting, ask your provider. General instructions Follow instructions from your provider on how to remove and change any dressings. Wear compression stockings as told by your provider. These stockings help to prevent blood clots and reduce swelling in your legs. If you were told to wear an Radio broadcast assistant, do so as told by your provider. Keep all follow-up visits. Your provider will check your skin for any changes and adjust your treatment plan as needed. Contact a health care provider if: Your condition does not get better with treatment. Your condition gets worse. You have any signs of infection. You see red streaks coming from the affected area. The affected area turns darker. You have a fever. Get help right away if: Your bone or joint under the affected area becomes painful after the skin has healed. You feel a deep pain in your leg or groin. You are short of breath. These symptoms may be an emergency. Get help right away. Call 911. Do not wait to see if the symptoms will go away. Do not drive yourself to the hospital. This information is not intended to replace advice given to you by your health care provider. Make sure you discuss any questions you have with your health care provider. Document Revised: 12/26/2022 Document Reviewed: 12/26/2022 Elsevier Patient Education  2024 [employer].

## 2024-10-02 NOTE — Progress Notes (Signed)
 Subjective:  Patient ID: [name], female    DOB: 1932/12/30  Age: 89 y.o. MRN: [id number]  CC: Hypertension and Anemia   HPI [name] presents for f/up -  Discussed the use of AI scribe software for clinical note transcription with the patient, who gave verbal consent to proceed.  History of Present Illness [name] is an 89 year old female who presents with leg swelling, redness, and itching.  She experiences bilateral leg swelling, redness, and occasional puffiness with heat. The condition is less severe today compared to previous episodes. There is no bleeding or bruising noted.  The skin is flaky and itchy, and she uses Lubriderm lotion to manage the dryness.  No fever, chills, or respiratory difficulties. She sometimes takes Tramadol  to aid sleep.  She has been prescribed antibiotics for this issue at least twice, but the problem persists despite treatment.     Outpatient Medications Prior to Visit  Medication Sig Dispense Refill   Docusate Calcium (STOOL SOFTENER PO) Take by mouth as needed.     Ferrous Sulfate  (IRON PO) Take by mouth daily.     gabapentin  (NEURONTIN ) 400 MG capsule Take 1 capsule (400 mg total) by mouth at bedtime. (Patient taking differently: Take 400 mg by mouth as needed.) 90 capsule 1   Ketotifen Fumarate (ALAWAY OP) Apply to eye as needed (itchy eyes).     latanoprost  (XALATAN ) 0.005 % ophthalmic solution INT 1 GTT IN OD QD     metoprolol  succinate (TOPROL -XL) 25 MG 24 hr tablet TAKE 1 TABLET(25 MG) BY MOUTH DAILY 90 tablet 1   Multiple Vitamin (MULTIVITAMIN) tablet Take 1 tablet by mouth daily.     Multiple Vitamins-Minerals (HAIR SKIN & NAILS ADVANCED) TABS Take 1 mg by mouth daily.     Olopatadine HCl (PATADAY OP) Apply 1 drop to eye daily.     pantoprazole  (PROTONIX ) 40 MG tablet TAKE 1 TABLET BY MOUTH 1 TO 2 TIMES DAILY 180 tablet 0   Polyethyl Glycol-Propyl Glycol (SYSTANE OP) Apply to eye daily. Left eye only      traMADol -acetaminophen  (ULTRACET ) 37.5-325 MG tablet Take 1 tablet by mouth every 8 (eight) hours as needed. 270 tablet 0   XARELTO  10 MG TABS tablet TAKE 1 TABLET(10 MG) BY MOUTH DAILY 90 tablet 0   No facility-administered medications prior to visit.    ROS Review of Systems  Constitutional:  Negative for appetite change, chills, diaphoresis, fatigue and fever.  HENT: Negative.    Respiratory: Negative.  Negative for cough, chest tightness, shortness of breath and wheezing.   Cardiovascular:  Positive for leg swelling. Negative for chest pain and palpitations.  Gastrointestinal:  Negative for abdominal pain, constipation, diarrhea, nausea and vomiting.  Genitourinary: Negative.  Negative for difficulty urinating.  Musculoskeletal:  Positive for arthralgias and gait problem. Negative for myalgias.  Skin:  Positive for color change and rash.  Neurological:  Positive for weakness. Negative for dizziness.  Hematological:  Negative for adenopathy. Does not bruise/bleed easily.  Psychiatric/Behavioral: Negative.      Objective:  BP (!) 146/68 (BP Location: Left Arm, Patient Position: Sitting, Cuff Size: Normal)   Pulse 74   Temp 98.5 F (36.9 C) (Oral)   Resp 16   Ht 4' 10 (1.473 m)   SpO2 97%   BMI 24.45 kg/m   BP Readings from Last 3 Encounters:  10/02/24 (!) 146/68  06/18/24 (!) 147/80  05/30/24 128/80    Wt Readings from Last 3 Encounters:  06/18/24 117  lb (53.1 kg)  05/30/24 117 lb 3.2 oz (53.2 kg)  05/07/24 115 lb 6.4 oz (52.3 kg)    Physical Exam Vitals reviewed.  HENT:     Mouth/Throat:     Mouth: Mucous membranes are moist.  Eyes:     General: No scleral icterus.    Conjunctiva/sclera: Conjunctivae normal.  Cardiovascular:     Rate and Rhythm: Normal rate and regular rhythm.     Heart sounds: No murmur heard.    No friction rub. No gallop.  Pulmonary:     Effort: Pulmonary effort is normal.     Breath sounds: No stridor. No wheezing, rhonchi or rales.   Abdominal:     Palpations: There is no mass.     Tenderness: There is no abdominal tenderness. There is no guarding.     Hernia: No hernia is present.  Musculoskeletal:     Cervical back: Neck supple.     Right lower leg: 1+ Edema present.     Left lower leg: 1+ Edema present.  Lymphadenopathy:     Cervical: No cervical adenopathy.  Skin:    Findings: Rash present.  Neurological:     Mental Status: She is alert. Mental status is at baseline.     Lab Results  Component Value Date   WBC 6.9 10/02/2024   HGB 11.7 (L) 10/02/2024   HCT 35.9 (L) 10/02/2024   PLT 351.0 10/02/2024   GLUCOSE 94 10/02/2024   CHOL 145 09/03/2023   TRIG 88.0 09/03/2023   HDL 69.40 09/03/2023   LDLCALC 58 09/03/2023   ALT 13 04/23/2023   AST 15 04/23/2023   NA 144 10/02/2024   K 3.8 10/02/2024   CL 105 10/02/2024   CREATININE 0.81 10/02/2024   BUN 13 10/02/2024   CO2 33 (H) 10/02/2024   TSH 1.526 04/02/2024   INR 1.3 (H) 04/23/2023   HGBA1C 5.4 05/30/2017    VAS US  LOWER EXTREMITY VENOUS REFLUX Result Date: 01/26/2024  Lower Venous Reflux Study Patient Name:  [name]  Date of Exam:   01/25/2024 Medical Rec #: [id number]    Accession #:    [id number] Date of Birth: [birth year]    Patient Gender: F Patient Age:   27 years Exam Location:  [facility] Procedure:      VAS US  LOWER EXTREMITY VENOUS REFLUX Referring Phys: NORMAN SERVE --------------------------------------------------------------------------------  Indications: Swelling.  Performing Technologist: Duwaine Hives RVS  Examination Guidelines: A complete evaluation includes B-mode imaging, spectral Doppler, color Doppler, and power Doppler as needed of all accessible portions of each vessel. Bilateral testing is considered an integral part of a complete examination. Limited examinations for reoccurring indications may be performed as noted. The reflux portion of the exam is performed with the patient in reverse Trendelenburg.  Significant venous reflux is defined as >500 ms in the superficial venous system, and >1 second in the deep venous system.  +--------------+---------+------+-----------+------------+-------------+ LEFT          Reflux NoRefluxReflux TimeDiameter cmsComments                              Yes                                       +--------------+---------+------+-----------+------------+-------------+ CFV           no                                                  +--------------+---------+------+-----------+------------+-------------+  FV mid        no                                                  +--------------+---------+------+-----------+------------+-------------+ Popliteal     no                                                  +--------------+---------+------+-----------+------------+-------------+ GSV at [facility]    no                           0.549                  +--------------+---------+------+-----------+------------+-------------+ GSV prox thighno                            .323                  +--------------+---------+------+-----------+------------+-------------+ GSV mid thigh                               .330                  +--------------+---------+------+-----------+------------+-------------+ GSV dist thighno                            .225    out of fascia +--------------+---------+------+-----------+------------+-------------+ GSV at knee                                 .183    out of fascia +--------------+---------+------+-----------+------------+-------------+ GSV prox calf no                            .225    out of fascia +--------------+---------+------+-----------+------------+-------------+ SSV Pop Fossa no                            .175                  +--------------+---------+------+-----------+------------+-------------+ SSV prox calf no                            .191                   +--------------+---------+------+-----------+------------+-------------+   Summary: Left: - No evidence of deep vein thrombosis seen in the left lower extremity, from the common femoral through the popliteal veins. - No evidence of superficial venous thrombosis in the left lower extremity. - There is no evidence of venous reflux seen in the left lower extremity.  *See table(s) above for measurements and observations. Electronically signed by Penne Colorado MD on 01/26/2024 at 3:59:22 PM.    Final     Assessment & Plan:  Venous stasis dermatitis of both lower extremities -     Vasculera; Take 1 capsule by mouth daily.  Dispense: 30 tablet; Refill: 11 -  Vasculera; Take 1 capsule by mouth daily. -     Clobetasol Propionate; Apply 1 Application topically 2 (two) times daily.  Dispense: 60 g; Refill: 3  Essential hypertension- BP is well controlled. -     Basic metabolic panel with GFR; Future  Iron deficiency anemia secondary to inadequate dietary iron intake- H/H have improved. -     IBC + Ferritin; Future -     CBC with Differential/Platelet; Future     Follow-up: Return if symptoms worsen or fail to improve.  Debby Molt, MD

## 2024-10-04 ENCOUNTER — Ambulatory Visit: Payer: Self-pay | Admitting: Internal Medicine

## 2024-10-15 ENCOUNTER — Other Ambulatory Visit: Payer: Self-pay | Admitting: Internal Medicine

## 2024-10-15 DIAGNOSIS — I2510 Atherosclerotic heart disease of native coronary artery without angina pectoris: Secondary | ICD-10-CM

## 2024-10-15 DIAGNOSIS — M479 Spondylosis, unspecified: Secondary | ICD-10-CM

## 2024-10-15 DIAGNOSIS — M51369 Other intervertebral disc degeneration, lumbar region without mention of lumbar back pain or lower extremity pain: Secondary | ICD-10-CM

## 2024-10-15 DIAGNOSIS — I10 Essential (primary) hypertension: Secondary | ICD-10-CM

## 2024-10-15 DIAGNOSIS — D6869 Other thrombophilia: Secondary | ICD-10-CM

## 2024-10-15 DIAGNOSIS — I82511 Chronic embolism and thrombosis of right femoral vein: Secondary | ICD-10-CM

## 2024-10-31 DIAGNOSIS — Z23 Encounter for immunization: Secondary | ICD-10-CM | POA: Diagnosis not present

## 2024-11-02 ENCOUNTER — Other Ambulatory Visit: Payer: Self-pay | Admitting: Internal Medicine

## 2024-11-02 DIAGNOSIS — K219 Gastro-esophageal reflux disease without esophagitis: Secondary | ICD-10-CM

## 2024-11-07 ENCOUNTER — Telehealth: Payer: Self-pay | Admitting: Internal Medicine

## 2024-11-07 NOTE — Telephone Encounter (Signed)
 Patient needs a letter from Dr. Joshua stating that she needs to have the trash people pull her cans to the curb for her.  Attached form - placed in Dr. Joshua box up front

## 2024-11-12 ENCOUNTER — Ambulatory Visit: Admitting: Internal Medicine

## 2024-11-24 ENCOUNTER — Ambulatory Visit: Admitting: Internal Medicine

## 2024-11-24 ENCOUNTER — Encounter: Payer: Self-pay | Admitting: Internal Medicine

## 2024-11-24 VITALS — BP 138/84 | HR 98 | Temp 98.5°F | Resp 16 | Ht <= 58 in | Wt 119.6 lb

## 2024-11-24 DIAGNOSIS — D508 Other iron deficiency anemias: Secondary | ICD-10-CM | POA: Diagnosis not present

## 2024-11-24 DIAGNOSIS — F0390 Unspecified dementia without behavioral disturbance: Secondary | ICD-10-CM

## 2024-11-24 DIAGNOSIS — I10 Essential (primary) hypertension: Secondary | ICD-10-CM | POA: Diagnosis not present

## 2024-11-24 DIAGNOSIS — D6869 Other thrombophilia: Secondary | ICD-10-CM | POA: Diagnosis not present

## 2024-11-24 LAB — CBC WITH DIFFERENTIAL/PLATELET
Basophils Absolute: 0 K/uL (ref 0.0–0.1)
Basophils Relative: 0.7 % (ref 0.0–3.0)
Eosinophils Absolute: 0.1 K/uL (ref 0.0–0.7)
Eosinophils Relative: 2.6 % (ref 0.0–5.0)
HCT: 36.8 % (ref 36.0–46.0)
Hemoglobin: 12.1 g/dL (ref 12.0–15.0)
Lymphocytes Relative: 13.9 % (ref 12.0–46.0)
Lymphs Abs: 0.8 K/uL (ref 0.7–4.0)
MCHC: 32.8 g/dL (ref 30.0–36.0)
MCV: 86.1 fl (ref 78.0–100.0)
Monocytes Absolute: 0.5 K/uL (ref 0.1–1.0)
Monocytes Relative: 8 % (ref 3.0–12.0)
Neutro Abs: 4.2 K/uL (ref 1.4–7.7)
Neutrophils Relative %: 74.8 % (ref 43.0–77.0)
Platelets: 358 K/uL (ref 150.0–400.0)
RBC: 4.27 Mil/uL (ref 3.87–5.11)
RDW: 14.6 % (ref 11.5–15.5)
WBC: 5.6 K/uL (ref 4.0–10.5)

## 2024-11-24 NOTE — Progress Notes (Unsigned)
 Subjective:  Patient ID: [name], female    DOB: [birth year]  Age: 89 y.o. MRN: [id number]  CC: Medical Management of Chronic Issues (6 month follow up )   HPI Telicia Hodgkiss presents for f/up  ----     Discussed the use of AI scribe software for clinical note transcription with the patient, who gave verbal consent to proceed.  History of Present Illness [name] is an 89 year old female who presents for a follow-up visit regarding her vaccination status and medication management.  She has received three vaccines recently, including one for RSV, but is uncertain about the other two and is attempting to verify her vaccination records.  She has hypertension. No symptoms related to her blood pressure are reported.  She takes an iron supplement, which causes her stool to appear unusual and hard. She uses Miralax  for constipation, which sometimes results in loose stools.  She is on Tramadol  and acetaminophen  daily for pain management and takes Xarelto , a blood thinner.  She has been working for forty years and does not drink or smoke.     Outpatient Medications Prior to Visit  Medication Sig Dispense Refill   clobetasol  cream (TEMOVATE ) 0.05 % Apply 1 Application topically 2 (two) times daily. 60 g 3   Dietary Management Product (VASCULERA) TABS Take 1 capsule by mouth daily. 30 tablet 11   Dietary Management Product (VASCULERA) TABS Take 1 capsule by mouth daily.     Docusate Calcium (STOOL SOFTENER PO) Take by mouth as needed.     Ferrous Sulfate  (IRON PO) Take by mouth daily.     gabapentin  (NEURONTIN ) 400 MG capsule Take 1 capsule (400 mg total) by mouth at bedtime. (Patient taking differently: Take 400 mg by mouth as needed.) 90 capsule 1   Ketotifen Fumarate (ALAWAY OP) Apply to eye as needed (itchy eyes).     latanoprost  (XALATAN ) 0.005 % ophthalmic solution INT 1 GTT IN OD QD     metoprolol  succinate (TOPROL -XL) 25 MG 24 hr tablet TAKE 1 TABLET(25 MG) BY MOUTH DAILY 90  tablet 1   Multiple Vitamin (MULTIVITAMIN) tablet Take 1 tablet by mouth daily.     Multiple Vitamins-Minerals (HAIR SKIN & NAILS ADVANCED) TABS Take 1 mg by mouth daily.     Olopatadine HCl (PATADAY OP) Apply 1 drop to eye daily.     pantoprazole  (PROTONIX ) 40 MG tablet TAKE 1 TABLET BY MOUTH 1 TO 2 TIMES DAILY 180 tablet 0   Polyethyl Glycol-Propyl Glycol (SYSTANE OP) Apply to eye daily. Left eye only     traMADol -acetaminophen  (ULTRACET ) 37.5-325 MG tablet TAKE 1 TABLET BY MOUTH EVERY 8 HOURS AS NEEDED 270 tablet 0   XARELTO  10 MG TABS tablet TAKE 1 TABLET(10 MG) BY MOUTH DAILY 90 tablet 0   No facility-administered medications prior to visit.    ROS Review of Systems  Objective:  BP 138/84 (BP Location: Left Arm, Patient Position: Sitting, Cuff Size: Normal)   Pulse 98   Temp 98.5 F (36.9 C) (Oral)   Resp 16   Ht 4' 10 (1.473 m)   Wt 119 lb 9.6 oz (54.3 kg)   SpO2 99%   BMI 25.00 kg/m   BP Readings from Last 3 Encounters:  11/24/24 138/84  10/02/24 (!) 146/68  06/18/24 (!) 147/80    Wt Readings from Last 3 Encounters:  11/24/24 119 lb 9.6 oz (54.3 kg)  06/18/24 117 lb (53.1 kg)  05/30/24 117 lb 3.2 oz (53.2 kg)  Physical Exam Musculoskeletal:     Right lower leg: 1+ Pitting Edema present.     Left lower leg: 1+ Pitting Edema present.     Lab Results  Component Value Date   WBC 6.9 10/02/2024   HGB 11.7 (L) 10/02/2024   HCT 35.9 (L) 10/02/2024   PLT 351.0 10/02/2024   GLUCOSE 94 10/02/2024   CHOL 145 09/03/2023   TRIG 88.0 09/03/2023   HDL 69.40 09/03/2023   LDLCALC 58 09/03/2023   ALT 13 04/23/2023   AST 15 04/23/2023   NA 144 10/02/2024   K 3.8 10/02/2024   CL 105 10/02/2024   CREATININE 0.81 10/02/2024   BUN 13 10/02/2024   CO2 33 (H) 10/02/2024   TSH 1.526 04/02/2024   INR 1.3 (H) 04/23/2023   HGBA1C 5.4 05/30/2017    VAS US  LOWER EXTREMITY VENOUS REFLUX Result Date: 01/26/2024  Lower Venous Reflux Study Patient Name:  [name]  Date  of Exam:   01/25/2024 Medical Rec #: [id number]    Accession #:    [id number] Date of Birth: 18-Nov-1933    Patient Gender: F Patient Age:   9 years Exam Location:  [facility] Procedure:      VAS US  LOWER EXTREMITY VENOUS REFLUX Referring Phys: NORMAN SERVE --------------------------------------------------------------------------------  Indications: Swelling.  Performing Technologist: Duwaine Hives RVS  Examination Guidelines: A complete evaluation includes B-mode imaging, spectral Doppler, color Doppler, and power Doppler as needed of all accessible portions of each vessel. Bilateral testing is considered an integral part of a complete examination. Limited examinations for reoccurring indications may be performed as noted. The reflux portion of the exam is performed with the patient in reverse Trendelenburg. Significant venous reflux is defined as >500 ms in the superficial venous system, and >1 second in the deep venous system.  +--------------+---------+------+-----------+------------+-------------+ LEFT          Reflux NoRefluxReflux TimeDiameter cmsComments                              Yes                                       +--------------+---------+------+-----------+------------+-------------+ CFV           no                                                  +--------------+---------+------+-----------+------------+-------------+ FV mid        no                                                  +--------------+---------+------+-----------+------------+-------------+ Popliteal     no                                                  +--------------+---------+------+-----------+------------+-------------+ GSV at [facility]    no  0.549                  +--------------+---------+------+-----------+------------+-------------+ GSV prox thighno                            .323                   +--------------+---------+------+-----------+------------+-------------+ GSV mid thigh                               .330                  +--------------+---------+------+-----------+------------+-------------+ GSV dist thighno                            .225    out of fascia +--------------+---------+------+-----------+------------+-------------+ GSV at knee                                 .183    out of fascia +--------------+---------+------+-----------+------------+-------------+ GSV prox calf no                            .225    out of fascia +--------------+---------+------+-----------+------------+-------------+ SSV Pop Fossa no                            .175                  +--------------+---------+------+-----------+------------+-------------+ SSV prox calf no                            .191                  +--------------+---------+------+-----------+------------+-------------+   Summary: Left: - No evidence of deep vein thrombosis seen in the left lower extremity, from the common femoral through the popliteal veins. - No evidence of superficial venous thrombosis in the left lower extremity. - There is no evidence of venous reflux seen in the left lower extremity.  *See table(s) above for measurements and observations. Electronically signed by Penne Colorado MD on 01/26/2024 at 3:59:22 PM.    Final     Assessment & Plan:  Essential hypertension     Follow-up: No follow-ups on file.  Debby Molt, MD

## 2024-11-24 NOTE — Patient Instructions (Signed)
Iron Deficiency Anemia, Adult  Iron deficiency anemia is a condition in which the concentration of red blood cells or hemoglobin in the blood is below normal because of too little iron. Hemoglobin is a substance in red blood cells that carries oxygen to the body's tissues. When the concentration of red blood cells or hemoglobin is too low, not enough oxygen reaches these tissues. Iron deficiency anemia is usually long-lasting, and it develops over time. It may or may not cause symptoms. It is a common type of anemia. What are the causes? This condition may be caused by: Not enough iron in the diet. Abnormal absorption in the gut. Blood loss. What increases the risk? You are more likely to develop this condition if you get menstrual periods (menstruate) or are pregnant. What are the signs or symptoms? Symptoms of this condition may include: Pale skin, lips, and nail beds. Weakness, dizziness, and getting tired easily. Shortness of breath when moving or exercising. Cold hands or feet. Mild anemia may not cause any symptoms. How is this diagnosed? This condition is diagnosed based on: Your medical history. A physical exam. Blood tests. How is this treated? This condition is treated by correcting the cause of your iron deficiency. Treatment may involve: Adding iron-rich foods to your diet. Taking iron supplements. If you are pregnant or breastfeeding, you may need to take extra iron because your normal diet usually does not provide the amount of iron that you need. Increasing vitamin C intake. Vitamin C helps your body absorb iron. Your health care provider may recommend that you take iron supplements along with a glass of orange juice or a vitamin C supplement. Medicines to make heavy menstrual flow lighter. Surgery or additional testing procedures to determine the cause of your anemia. You may need repeat blood tests to determine whether treatment is working. If the treatment does not  seem to be working, you may need more tests. Follow these instructions at home: Medicines Take over-the-counter and prescription medicines only as told by your health care provider. This includes iron supplements and vitamins. This is important because too much iron can be harmful. For the best iron absorption, you should take iron supplements when your stomach is empty. If you cannot tolerate them on an empty stomach, you may need to take them with food. Do not drink milk or take antacids at the same time as your iron supplements. Milk and antacids may interfere with how your body absorbs iron. Iron supplements may turn stool (feces) a darker color and it may appear black. If you cannot tolerate taking iron supplements by mouth, talk with your health care provider about taking them through an IV or through an injection into a muscle. Eating and drinking Talk with your health care provider before changing your diet. Your provider may recommend that you eat foods that contain a lot of iron, such as: Liver. Low-fat (lean) beef. Breads and cereals that have iron added to them (are fortified). Eggs. Dried fruit. Dark green, leafy vegetables. To help your body use the iron from iron-rich foods, eat those foods at the same time as fresh fruits and vegetables that are high in vitamin C. Foods that are high in vitamin C include: Oranges. Peppers. Tomatoes. Mangoes. Managing constipation If you are taking an iron supplement, it may cause constipation. To prevent or treat constipation, you may need to: Drink enough fluid to keep your urine pale yellow. Take over-the-counter or prescription medicines. Eat foods that are high in fiber, such   as beans, whole grains, and fresh fruits and vegetables. Limit foods that are high in fat and processed sugars, such as fried or sweet foods. General instructions Return to your normal activities as told by your health care provider. Ask your health care provider  what activities are safe for you. Keep all follow-up visits. Contact a health care provider if: You feel nauseous or you vomit. You feel weak. You become light-headed when getting up from a sitting or lying down position. You have unexplained sweating. You develop symptoms of constipation. You have a heaviness in your chest. You have trouble breathing with physical activity. Get help right away if: You faint. If this happens, do not drive yourself to the hospital. You have an irregular or rapid heartbeat. Summary Iron deficiency anemia is a condition in which the concentration of red blood cells or hemoglobin in the blood is below normal because of too little iron. This condition is treated by correcting the cause of your iron deficiency. Take over-the-counter and prescription medicines only as told by your health care provider. This includes iron supplements and vitamins. To help your body use the iron from iron-rich foods, eat those foods at the same time as fresh fruits and vegetables that are high in vitamin C. Seek medical help if you have signs or symptoms of worsening anemia. This information is not intended to replace advice given to you by your health care provider. Make sure you discuss any questions you have with your health care provider. Document Revised: 01/18/2022 Document Reviewed: 01/18/2022 Elsevier Patient Education  2024 [employer]  

## 2024-11-25 LAB — IBC + FERRITIN
Ferritin: 28.3 ng/mL (ref 10.0–291.0)
Iron: 28 ug/dL — ABNORMAL LOW (ref 42–145)
Saturation Ratios: 8.5 % — ABNORMAL LOW (ref 20.0–50.0)
TIBC: 327.6 ug/dL (ref 250.0–450.0)
Transferrin: 234 mg/dL (ref 212.0–360.0)

## 2024-11-26 ENCOUNTER — Ambulatory Visit: Payer: Self-pay | Admitting: Internal Medicine

## 2024-11-26 MED ORDER — ACCRUFER 30 MG PO CAPS: 1.0000 | ORAL_CAPSULE | Freq: Two times a day (BID) | ORAL | 0 refills | Status: AC

## 2024-12-03 ENCOUNTER — Ambulatory Visit (INDEPENDENT_AMBULATORY_CARE_PROVIDER_SITE_OTHER): Admitting: Otolaryngology

## 2024-12-03 VITALS — HR 76 | Temp 97.9°F | Ht 62.0 in | Wt 125.0 lb

## 2024-12-03 DIAGNOSIS — H9041 Sensorineural hearing loss, unilateral, right ear, with unrestricted hearing on the contralateral side: Secondary | ICD-10-CM | POA: Diagnosis not present

## 2024-12-03 DIAGNOSIS — H6123 Impacted cerumen, bilateral: Secondary | ICD-10-CM | POA: Diagnosis not present

## 2024-12-03 NOTE — Progress Notes (Signed)
 Patient ID: [name], female   DOB: 04-17-1933, 89 y.o.   MRN: [id number]  Follow-up: Asymmetric right ear hearing loss, recurrent cerumen impaction   HPI: The patient is an 89 year old female who returns today with her caregiver.  The patient has a history of bilateral high-frequency sensorineural hearing loss, worse on the right side.  The small possibility of a retrocochlear lesion causing her asymmetric hearing loss was discussed.  Due to her advanced age, the patient has elected to proceed with conservative observation instead of MRI scan.  The patient returns today reporting no significant change in her hearing.  She also has a history of recurrent cerumen impaction.  Exam: General: Communicates without difficulty, well nourished, no acute distress. Head: Normocephalic, no evidence injury, no tenderness, facial buttresses intact without stepoff. Face/sinus: No tenderness to palpation and percussion. Facial movement is normal and symmetric. Eyes: PERRL, EOMI. No scleral icterus, conjunctivae clear. Neuro: CN II exam reveals vision grossly intact.  No nystagmus at any point of gaze. Ears: Auricles well formed without lesions.  Bilateral cerumen impaction.  Nose: External evaluation reveals normal support and skin without lesions.  Dorsum is intact.  Anterior rhinoscopy reveals congested mucosa over anterior aspect of inferior turbinates and intact septum.  No purulence noted. Oral:  Oral cavity and oropharynx are intact, symmetric, without erythema or edema.  Mucosa is moist without lesions. Neck: Full range of motion without pain.  There is no significant lymphadenopathy.  No masses palpable.  Thyroid  bed within normal limits to palpation.  Parotid glands and submandibular glands equal bilaterally without mass.  Trachea is midline. Neuro:  CN 2-12 grossly intact.   Procedure: Bilateral cerumen disimpaction Anesthesia: None Description: Under the operating microscope, the cerumen is carefully  removed with a combination of cerumen currette, alligator forceps, and suction catheters.  After the cerumen is removed, the TMs are noted to be normal.  No mass, erythema, or lesions. The patient tolerated the procedure well.    Assessment: 1.  Subjectively stable bilateral high-frequency sensorineural hearing loss, worse on the right side. 2.  Bilateral cerumen impaction. 3.  Her ear canals, tympanic membranes, and middle ear spaces are otherwise normal.  Plan: 1.  The physical exam findings are reviewed with the patient and her caregiver. 2.  Otomicroscopy with bilateral cerumen disimpaction. 3.  The patient is a candidate for hearing amplification.  The hearing aid options are discussed. 4.  The patient will return for reevaluation in 6 months.

## 2024-12-09 ENCOUNTER — Other Ambulatory Visit: Payer: Self-pay

## 2024-12-10 ENCOUNTER — Ambulatory Visit: Admitting: Podiatry

## 2024-12-10 ENCOUNTER — Encounter: Payer: Self-pay | Admitting: Podiatry

## 2024-12-10 DIAGNOSIS — L84 Corns and callosities: Secondary | ICD-10-CM

## 2024-12-10 DIAGNOSIS — M79676 Pain in unspecified toe(s): Secondary | ICD-10-CM

## 2024-12-10 DIAGNOSIS — B351 Tinea unguium: Secondary | ICD-10-CM

## 2024-12-10 DIAGNOSIS — D689 Coagulation defect, unspecified: Secondary | ICD-10-CM | POA: Diagnosis not present

## 2024-12-10 NOTE — Progress Notes (Unsigned)
°  Subjective:  Patient ID: [name], female    DOB: 1933/09/28,  MRN: [id number]  Magalie Almon presents to clinic today for at risk foot care with h/o clotting disorder and corn(s) right lower extremity and painful mycotic toenails that are difficult to trim. Painful toenails interfere with ambulation. Aggravating factors include wearing enclosed shoe gear. Pain is relieved with periodic professional debridement. Painful corns are aggravated when weightbearing when wearing enclosed shoe gear. Pain is relieved with periodic professional debridement.  Chief Complaint  Patient presents with   RFC    RFC not diabetic Joshua Debby CROME, MD (PCP), 11/24/24    New problem(s): None.   PCP is Joshua Debby CROME, MD.  Allergies[1]  Review of Systems: Negative except as noted in the HPI.  Objective: No changes noted in today's physical examination. There were no vitals filed for this visit. [name] is a pleasant 89 y.o. female {jgbodyhabitus:[id number]} AAO x 3.  Vascular Examination: CFT <3 seconds b/l. DP/PT pulses faintly palpable b/l. Digital hair absent.  Skin temperature gradient warm to warm b/l. No pain with calf compression. No ischemia or gangrene. No cyanosis or clubbing noted b/l. Dependent edema noted b/l LE.   Neurological Examination: Sensation grossly intact b/l with 10 gram monofilament. Vibratory sensation intact b/l.   Dermatological Examination: Pedal skin warm and supple b/l.   No open wounds. No interdigital macerations.  Toenails left hallux and 2-5 b/l thick, discolored, elongated with subungual debris and pain on dorsal palpation.    Right great toenail mycotic nailplate with subungual maceration of skin extending to distal tip of digit. There is red, granular tissue at tip. No erythema, no edema, no drainage, no fluctuance.  Hyperkeratotic lesion(s) dorsal PIPJ of right second digit.  No erythema, no edema, no drainage, no fluctuance.  Musculoskeletal  Examination: Muscle strength 4/5 to all lower extremity muscle groups bilaterally. HAV with bunion bilaterally and hammertoes 2-5 b/l. Utilizes rollator for ambulation assistance.  Radiographs: None  Assessment/Plan: 1. Pain due to onychomycosis of toenail   2. Corns   3. Clotting disorder     No orders of the defined types were placed in this encounter.   None {Jgplan:[id number]::-Patient/POA to call should there be question/concern in the interim.}   Return in about 3 months (around 03/10/2025).  Delon CROME Merlin, DPM      [place] LOCATION: [address] [place] [address]                   Office [phone]   [facility] LOCATION: [address] [place], [place] [address] Office [phone]     [1] No Known Allergies

## 2024-12-23 ENCOUNTER — Ambulatory Visit: Payer: Self-pay

## 2024-12-23 ENCOUNTER — Encounter (HOSPITAL_COMMUNITY): Payer: Self-pay

## 2024-12-23 ENCOUNTER — Ambulatory Visit (HOSPITAL_COMMUNITY)
Admission: EM | Admit: 2024-12-23 | Discharge: 2024-12-23 | Disposition: A | Discharge status: Home / Self Care | Attending: Emergency Medicine | Admitting: Emergency Medicine

## 2024-12-23 ENCOUNTER — Encounter: Payer: Self-pay | Admitting: Internal Medicine

## 2024-12-23 DIAGNOSIS — L03116 Cellulitis of left lower limb: Secondary | ICD-10-CM | POA: Diagnosis not present

## 2024-12-23 DIAGNOSIS — I878 Other specified disorders of veins: Secondary | ICD-10-CM

## 2024-12-23 DIAGNOSIS — S81801A Unspecified open wound, right lower leg, initial encounter: Secondary | ICD-10-CM | POA: Insufficient documentation

## 2024-12-23 DIAGNOSIS — I872 Venous insufficiency (chronic) (peripheral): Secondary | ICD-10-CM | POA: Diagnosis not present

## 2024-12-23 MED ORDER — CEPHALEXIN 500 MG PO CAPS: 500.0000 mg | ORAL_CAPSULE | Freq: Two times a day (BID) | ORAL | 0 refills | Status: AC

## 2024-12-23 NOTE — ED Provider Notes (Signed)
 " [facility]    CSN: [id number] Arrival date & time: 12/23/24  1433      History   Chief Complaint No chief complaint on file.   HPI [name] is a 89 y.o. female.   Patient brought into clinic by family member and caregiver over concern of worsening wound to the left distal leg.  The wound itself is been present for about 2 years or so.  Over the past 2 weeks the area has increased in redness, swelling, weeping and has become quite painful.  Daughter messaged a picture to the primary care provider earlier today who sent in an urgent referral for wound care.  Patient does have chronic venous insufficiency.  Has not had vascular ultrasound that was ordered in January of this year.  Patient has not had a fever or tachycardia.  Was using clobetasol  cream to the area because initially it was itchy.  Stopped doing this around a week ago.    The history is provided by the patient and medical records.    Past Medical History:  Diagnosis Date   Abnormal finding on Pap smear, ASCUS    Acute renal insufficiency    Amputation finger    index   Barrett's esophagus    CAD (coronary artery disease)    Dementia (HCC)    Diverticulosis    Elevated BP    Foot deformity    GERD (gastroesophageal reflux disease)    Glaucoma    H/O: GI bleed    HSV-2 (herpes simplex virus 2) infection    Hyperglycemia    Hypertension    Migraine    Osteoporosis    worsening   Spondylosis     Patient Active Problem List   Diagnosis Date Noted   Leg wound, right, initial encounter 12/23/2024   Venous stasis dermatitis of both lower extremities 10/02/2024   Impacted cerumen of both ears 12/17/2023   Sensorineural hearing loss, unilateral, right ear, with unrestricted hearing on the contralateral side 12/17/2023   Post-phlebitic syndrome 11/21/2023   Venous insufficiency of lower extremity 11/21/2023   Gastroesophageal reflux disease with esophagitis without hemorrhage  04/23/2023   Barrett's esophagus without dysplasia 04/23/2023   Acquired thrombophilia 03/05/2023   Chronic deep vein thrombosis (DVT) of right femoral vein (HCC) 03/05/2023   Closed wedge compression fracture of thoracic vertebra (HCC) 08/10/2022   Myofascial pain syndrome 07/07/2021   Degenerative disc disease, lumbar 01/01/2018   Hiatal hernia 11/04/2016   Gastroesophageal reflux disease without esophagitis 07/31/2014   Dementia (HCC)    Spondylosis    Migraine    CAD (coronary artery disease)    HSV-2 (herpes simplex virus 2) infection    Osteoporosis    Constipation due to slow transit 12/12/2010   ANEMIA, IRON DEFICIENCY 06/17/2008   Essential hypertension 06/16/2008    Past Surgical History:  Procedure Laterality Date   AMPUTATION FINGER / THUMB     secondary to osteomyelitis   BIOPSY  04/24/2023   Procedure: BIOPSY;  Surgeon: San Sandor GAILS, DO;  Location: [facility];  Service: Gastroenterology;;   CATARACT EXTRACTION, BILATERAL Bilateral 12/25/2016   COLONOSCOPY     ESOPHAGOGASTRODUODENOSCOPY N/A 10/22/2016   Procedure: ESOPHAGOGASTRODUODENOSCOPY (EGD);  Surgeon: Victory LITTIE Legrand DOUGLAS, MD;  Location: [facility] ENDOSCOPY;  Service: Endoscopy;  Laterality: N/A;   ESOPHAGOGASTRODUODENOSCOPY N/A 04/24/2023   Procedure: ESOPHAGOGASTRODUODENOSCOPY (EGD);  Surgeon: San Sandor GAILS, DO;  Location: [facility] ENDOSCOPY;  Service: Gastroenterology;  Laterality: N/A;   FOOT SURGERY  right   PARTIAL HYSTERECTOMY     UPPER GASTROINTESTINAL ENDOSCOPY      OB History   No obstetric history on file.      Home Medications    Prior to Admission medications  Medication Sig Start Date End Date Taking? Authorizing Provider  cephALEXin  (KEFLEX ) 500 MG capsule Take 1 capsule (500 mg total) by mouth 2 (two) times daily for 7 days. 12/23/24 12/30/24 Yes Cristino Degroff  N, FNP  clobetasol  cream (TEMOVATE ) 0.05 % Apply 1 Application topically 2 (two) times daily. 10/02/24   Joshua Debby CROME, MD   Dietary Management Product (VASCULERA PO) Take 30 mg by mouth daily.    [provider]  Dietary Management Product (VASCULERA) TABS Take 1 capsule by mouth daily. 10/02/24   Joshua Debby CROME, MD  Docusate Calcium (STOOL SOFTENER PO) Take by mouth as needed.    [provider]  Ferric Maltol  (ACCRUFER ) 30 MG CAPS Take 1 capsule (30 mg total) by mouth in the morning and at bedtime. 11/26/24   Joshua Debby CROME, MD  gabapentin  (NEURONTIN ) 400 MG capsule Take 1 capsule (400 mg total) by mouth at bedtime. Patient taking differently: Take 400 mg by mouth as needed. 01/27/22   Joshua Debby CROME, MD  Ketotifen Fumarate (ALAWAY OP) Apply to eye as needed (itchy eyes).    [provider]  latanoprost  (XALATAN ) 0.005 % ophthalmic solution INT 1 GTT IN OD QD 08/26/19   [provider]  metoprolol  succinate (TOPROL -XL) 25 MG 24 hr tablet TAKE 1 TABLET(25 MG) BY MOUTH DAILY 10/20/24   Joshua Debby CROME, MD  Multiple Vitamin (MULTIVITAMIN) tablet Take 1 tablet by mouth daily.    [provider]  Multiple Vitamins-Minerals (HAIR SKIN & NAILS ADVANCED) TABS Take 1 mg by mouth daily.    [provider]  Olopatadine HCl (PATADAY OP) Apply 1 drop to eye daily.    [provider]  pantoprazole  (PROTONIX ) 40 MG tablet TAKE 1 TABLET BY MOUTH 1 TO 2 TIMES DAILY 11/03/24   Joshua Debby CROME, MD  Polyethyl Glycol-Propyl Glycol (SYSTANE OP) Apply to eye daily. Left eye only    [provider]  traMADol -acetaminophen  (ULTRACET ) 37.5-325 MG tablet TAKE 1 TABLET BY MOUTH EVERY 8 HOURS AS NEEDED 10/20/24   Joshua Debby CROME, MD  XARELTO  10 MG TABS tablet TAKE 1 TABLET(10 MG) BY MOUTH DAILY 10/20/24   Joshua Debby CROME, MD    Family History Family History  Problem Relation Age of Onset   Breast cancer Mother    Heart attack Mother    Heart disease Father    Alcohol  abuse Son    Colon cancer Neg Hx    Stomach cancer Neg Hx    Rectal cancer Neg Hx     Social  History Social History[1]   Allergies   Patient has no known allergies.   Review of Systems Review of Systems  Per HPI  Physical Exam Triage Vital Signs ED Triage Vitals  Encounter Vitals Group     BP 12/23/24 1523 (!) 169/92     Girls Systolic BP Percentile --      Girls Diastolic BP Percentile --      Boys Systolic BP Percentile --      Boys Diastolic BP Percentile --      Pulse Rate 12/23/24 1523 88     Resp 12/23/24 1523 14     Temp 12/23/24 1523 98.7 F (37.1 C)     Temp Source 12/23/24 1523  Oral     SpO2 12/23/24 1523 93 %     Weight --      Height --      Head Circumference --      Peak Flow --      Pain Score 12/23/24 1522 8     Pain Loc --      Pain Education --      Exclude from Growth Chart --    No data found.  Updated Vital Signs BP (!) 169/92 (BP Location: Right Arm)   Pulse 88   Temp 98.7 F (37.1 C) (Oral)   Resp 14   SpO2 93%   Visual Acuity Right Eye Distance:   Left Eye Distance:   Bilateral Distance:    Right Eye Near:   Left Eye Near:    Bilateral Near:     Physical Exam Vitals and nursing note reviewed.  Constitutional:      Appearance: Normal appearance.  HENT:     Head: Normocephalic and atraumatic.     Right Ear: External ear normal.     Left Ear: External ear normal.     Nose: Nose normal.     Mouth/Throat:     Mouth: Mucous membranes are moist.  Eyes:     Conjunctiva/sclera: Conjunctivae normal.  Cardiovascular:     Rate and Rhythm: Normal rate.  Pulmonary:     Effort: Pulmonary effort is normal. No respiratory distress.  Skin:    General: Skin is warm and dry.      Neurological:     General: No focal deficit present.     Mental Status: She is alert.  Psychiatric:        Mood and Affect: Mood normal.        Behavior: Behavior is cooperative.      UC Treatments / Results  Labs (all labs ordered are listed, but only abnormal results are displayed) Labs Reviewed - No data to  display  EKG   Radiology No results found.  Procedures Procedures (including critical care time)  Medications Ordered in UC Medications - No data to display  Initial Impression / Assessment and Plan / UC Course  I have reviewed the triage vital signs and the nursing notes.  Pertinent labs & imaging results that were available during my care of the patient were reviewed by me and considered in my medical decision making (see chart for details).  Vitals and triage reviewed, patient is hemodynamically stable.   Chronic wound to the left medial leg has worsened recently.  Area is erythematous, warm and swollen with weeping.  Without systemic signs of infection such as tachycardia or fever.  Will cover with Keflex  for concern of cellulitis.  Encouraged proper wound care and wound care follow-up.  Plan of care, follow-up care and return precautions given, no questions at this time.    Final Clinical Impressions(s) / UC Diagnoses   Final diagnoses:  Cellulitis of left lower extremity  Venous stasis of both lower extremities  Chronic venous insufficiency     Discharge Instructions      Take the antibiotic twice daily with food to treat the infection in your lower leg  Elevate your leg when sitting or resting  Follow-up with wound care in the next week or so  Change the dressing on your leg when it gets wet or every 1-2 days  Venous ultrasound of the lower leg was ordered by your PCP in January of 2025, it would be a good  idea to complete this  Stop using the clobetasol  cream  Follow-up with primary care provider if this problem persists or returns       ED Prescriptions     Medication Sig Dispense Auth. Provider   cephALEXin  (KEFLEX ) 500 MG capsule Take 1 capsule (500 mg total) by mouth 2 (two) times daily for 7 days. 14 capsule Dreama, Kazuo Durnil  N, FNP      PDMP not reviewed this encounter.     [1]  Social History Tobacco Use   Smoking status: Never    Smokeless tobacco: Never  Vaping Use   Vaping status: Never Used  Substance Use Topics   Alcohol  use: No    Alcohol /week: 0.0 standard drinks of alcohol    Drug use: No     Dreama Luigi SAILOR, FNP 12/23/24 1644  "

## 2024-12-23 NOTE — ED Triage Notes (Signed)
 Patient's daughter reports that the patient has had a wound to the left distal leg (medial) x 2 years. Patient's daughter reports that the  leg has increased redness and pain. Patiaent states she has seen pus from the wound,but none present at this time.  Daughter states she was referred to UC this AM and while looking over the patient's My Chart during triage she saw where they made an urgent referral to Wound care.

## 2024-12-23 NOTE — Telephone Encounter (Signed)
 FYI Only or Action Required?: FYI only for provider: UC advised .  Patient was last seen in primary care on 11/24/2024 by Joshua Debby CROME, MD.  Called Nurse Triage reporting Leg Swelling.  Symptoms began several days ago.  Interventions attempted: Other: stopped using prescription cream.  Symptoms are: gradually worsening.  Triage Disposition: See HCP Within 4 Hours (Or PCP Triage)  Patient/caregiver understands and will follow disposition?: Yes                                         Reason for Disposition  [1] Red area or streak [2] large (> 2 inches or 5 cm)  Answer Assessment - Initial Assessment Questions Patient niece Channing [facility]) reports (thinks) left lower leg doesn't seem to be getting better. Looked at photo of last time was in office looked not better or worse. The medication given seemed to be not helping noticed pus stopped using it 1 week ago. Still looks pretty bad . Cherly not with patent reports reports the aid wiped pus of leg and that is where its shiny red and painful. Patient niece advised UC as there are not available appointments today . Patient niece is agreeable and taking her to [facility]     1. ONSET: When did the swelling start? (e.g., minutes, hours, days)     Legs have been swollen has ongoing leg issues seen in office 12/1   2. LOCATION: What part of the leg is swollen?  Are both legs swollen or just one leg?     Lower leg niece thinks left leg  3. SEVERITY: How bad is the swelling? (e.g., localized; mild, moderate, severe)     A lot of swelling redness open sore with pus  4. REDNESS: Is there redness or signs of infection?     Yes redness niece states shiny  5. PAIN: Is the swelling painful to touch? If Yes, ask: How painful is it?   (Scale 1-10; mild, moderate or severe)     Patient is reporting pain to niece  6. FEVER: Do you have a fever? If Yes, ask: What is it, how was it  measured, and when did it start?      Does not think she has a fever  7. CAUSE: What do you think is causing the leg swelling?     Venous statis of legs per chart , has had cellulitis before in leg   10. OTHER SYMPTOMS: Do you have any other symptoms? (e.g., chest pain, difficulty breathing) Niece attached picture , leg very red and swollen. Reviewed and compared picture from 12/1 visit as well significant change in leg         Patient niece denies the following denies patient with the following chest pain reported, shortness of breath  Protocols used: Leg Swelling and Edema-A-AH  Copied from CRM #[id number]. Topic: Clinical - Red Word Triage >> Dec 23, 2024  9:54 AM China J wrote: [facility] that prompted transfer to Nurse Triage: Patient has an open wound on her lower leg that is oozing, painful and swollen.

## 2024-12-23 NOTE — Discharge Instructions (Addendum)
 Take the antibiotic twice daily with food to treat the infection in your lower leg  Elevate your leg when sitting or resting  Follow-up with wound care in the next week or so  Change the dressing on your leg when it gets wet or every 1-2 days  Venous ultrasound of the lower leg was ordered by your PCP in January of 2025, it would be a good idea to complete this  Stop using the clobetasol  cream  Follow-up with primary care provider if this problem persists or returns

## 2024-12-23 NOTE — Telephone Encounter (Signed)
 This is being handled in mychart. Dr. Joshua placed an urgent referral to Wound Care for the patient.

## 2024-12-30 ENCOUNTER — Emergency Department (HOSPITAL_COMMUNITY)

## 2024-12-30 ENCOUNTER — Other Ambulatory Visit: Payer: Self-pay

## 2024-12-30 ENCOUNTER — Encounter (HOSPITAL_COMMUNITY): Payer: Self-pay

## 2024-12-30 ENCOUNTER — Ambulatory Visit: Payer: Self-pay

## 2024-12-30 ENCOUNTER — Emergency Department (HOSPITAL_COMMUNITY)
Admission: EM | Admit: 2024-12-30 | Discharge: 2024-12-31 | Disposition: A | Discharge status: Home / Self Care | Attending: Emergency Medicine | Admitting: Emergency Medicine

## 2024-12-30 DIAGNOSIS — R600 Localized edema: Secondary | ICD-10-CM | POA: Insufficient documentation

## 2024-12-30 DIAGNOSIS — M7989 Other specified soft tissue disorders: Secondary | ICD-10-CM

## 2024-12-30 DIAGNOSIS — Z79899 Other long term (current) drug therapy: Secondary | ICD-10-CM | POA: Insufficient documentation

## 2024-12-30 DIAGNOSIS — I2510 Atherosclerotic heart disease of native coronary artery without angina pectoris: Secondary | ICD-10-CM | POA: Insufficient documentation

## 2024-12-30 DIAGNOSIS — R2242 Localized swelling, mass and lump, left lower limb: Secondary | ICD-10-CM | POA: Diagnosis present

## 2024-12-30 DIAGNOSIS — Z7901 Long term (current) use of anticoagulants: Secondary | ICD-10-CM | POA: Insufficient documentation

## 2024-12-30 DIAGNOSIS — I10 Essential (primary) hypertension: Secondary | ICD-10-CM | POA: Insufficient documentation

## 2024-12-30 DIAGNOSIS — F0390 Unspecified dementia without behavioral disturbance: Secondary | ICD-10-CM | POA: Diagnosis not present

## 2024-12-30 LAB — COMPREHENSIVE METABOLIC PANEL WITH GFR
ALT: 9 U/L (ref 0–44)
AST: 18 U/L (ref 15–41)
Albumin: 3.9 g/dL (ref 3.5–5.0)
Alkaline Phosphatase: 78 U/L (ref 38–126)
Anion gap: 10 (ref 5–15)
BUN: 15 mg/dL (ref 8–23)
CO2: 29 mmol/L (ref 22–32)
Calcium: 9.7 mg/dL (ref 8.9–10.3)
Chloride: 105 mmol/L (ref 98–111)
Creatinine, Ser: 0.81 mg/dL (ref 0.44–1.00)
GFR, Estimated: >60 mL/min
Glucose, Bld: 96 mg/dL (ref 70–99)
Potassium: 4.2 mmol/L (ref 3.5–5.1)
Sodium: 144 mmol/L (ref 135–145)
Total Bilirubin: 0.2 mg/dL (ref 0.0–1.2)
Total Protein: 7.3 g/dL (ref 6.5–8.1)

## 2024-12-30 LAB — CBC WITH DIFFERENTIAL/PLATELET
Abs Immature Granulocytes: 0.01 K/uL (ref 0.00–0.07)
Basophils Absolute: 0.1 K/uL (ref 0.0–0.1)
Basophils Relative: 1 %
Eosinophils Absolute: 0.1 K/uL (ref 0.0–0.5)
Eosinophils Relative: 2 %
HCT: 40.3 % (ref 36.0–46.0)
Hemoglobin: 12.9 g/dL (ref 12.0–15.0)
Immature Granulocytes: 0 %
Lymphocytes Relative: 16 %
Lymphs Abs: 0.9 K/uL (ref 0.7–4.0)
MCH: 28.5 pg (ref 26.0–34.0)
MCHC: 32 g/dL (ref 30.0–36.0)
MCV: 89.2 fL (ref 80.0–100.0)
Monocytes Absolute: 0.5 K/uL (ref 0.1–1.0)
Monocytes Relative: 9 %
Neutro Abs: 3.8 K/uL (ref 1.7–7.7)
Neutrophils Relative %: 72 %
Platelets: 381 K/uL (ref 150–400)
RBC: 4.52 MIL/uL (ref 3.87–5.11)
RDW: 14.2 % (ref 11.5–15.5)
WBC: 5.3 K/uL (ref 4.0–10.5)
nRBC: 0 % (ref 0.0–0.2)

## 2024-12-30 LAB — I-STAT CG4 LACTIC ACID, ED: Lactic Acid, Venous: 0.9 mmol/L (ref 0.5–1.9)

## 2024-12-30 NOTE — Telephone Encounter (Signed)
 FYI Only or Action Required?: FYI only for provider: ED advised.  Patient was last seen in primary care on 11/24/2024 by Joshua Debby CROME, MD.  Called Nurse Triage reporting Wound Infection.  Symptoms began several years ago.  Interventions attempted: Prescription medications: keflex .  Symptoms are: gradually worsening.  Triage Disposition: Call PCP Now  Patient/caregiver understands and will follow disposition?: Yes  Copied from CRM [id number]. Topic: Clinical - Red Word Triage >> Dec 30, 2024 12:14 PM Geneva B wrote: Red Word that prompted transfer to Nurse Triage:  Patient has an open wound on her lower leg that is oozing, painful and swollen and spreading up her leg Reason for Disposition  Patient sounds very sick or weak to the triager  Answer Assessment - Initial Assessment Questions Niece Channing said wounds look worse now then when patient seen at [facility] ,photo in my chart. Says wounds are now open and draining, before they were not really open. Red streak has appeared on leg and stops before knee. Has not heard from wound care. Says even non adherent pads are sticking to skin. Needs called with the name of number of wound care and wants to know why they haven't called her. 1. SYMPTOM: What's the main symptom you're concerned about? (e.g., redness, swelling, pain, fever, weakness)     redness 2. WOUND INFECTION LOCATION: Where is the wound infection located? (e.g., arm, face, foot, knee, leg)     Left ankle 3. WOUND INFECTION SIZE: What is the size of the red area? (e.g., inches, centimeters; compare to size of a coin)      3, all around size of nickle. 4. BETTER-SAME-WORSE: Are you getting better, staying the same, or getting worse compared to the day you started the antibiotics?      Getting worse, has gone higher on leg skin looks like it is cracking, shiny like it has saran wrap over it. Picture taken and sent through mychart.  5. PAIN: Do you have any pain?  If Yes,  ask: How bad is the pain?  (e.g., Scale 1-10; mild, moderate, or severe)     painful 6. FEVER: Do you have a fever? If Yes, ask: What is it, how was it measured and when did it start?     unsure 7. OTHER SYMPTOMS: Do you have any other symptoms? (e.g., pus coming from a wound, red streaks, weakness)     Red streaks, pus coming from wound 8. DIAGNOSIS DATE: When was the wound infection diagnosed? By whom?      12/30 UC 9. ANTIBIOTIC NAME: What antibiotic(s) are you taking?  How many times a day? Note: Be sure the patient is taking the antibiotic as directed.      Keflex , 12/30 has not missed any doses. 10. ANTIBIOTIC DATE: When was the antibiotic started?       12/23/2025  Protocols used: Wound Infection on Antibiotic Follow-up Call-A-AH

## 2024-12-30 NOTE — ED Triage Notes (Signed)
 Daughter reports pt diagnosed recently with vascular issue; reports wounds to bilateral legs that are not healing ; started on abx x 1 week ago, finished today; reports purulent drainage and redness

## 2024-12-30 NOTE — ED Provider Triage Note (Signed)
 Emergency Medicine Provider Triage Evaluation Note  [name] , a 89 y.o. female  was evaluated in triage.  Pt complains of left leg pain/swelling increasing for the past week and a half.  States went to urgent care as PCP was full.  Placed on Keflex  but swelling and redness has continued to get worse.  Also notes a couple open sores on the backside of the leg.  Daughter notes intermittent cellulitis for about 2 years.  Denies fevers.  Review of Systems  Positive: Wound, redness Negative: Fevers, drainage  Physical Exam  BP (!) 178/94 (BP Location: Left Arm)   Pulse 83   Temp 98.8 F (37.1 C)   Resp 20   SpO2 98%  Gen:   Awake, no distress   Resp:  Normal effort  MSK:   Moves extremities without difficulty  Other:  Redness and edema noted to the left lower extremity.  Small wound present on the posterior left leg.  No active drainage.  Medical Decision Making  Medically screening exam initiated at 8:21 PM.  Appropriate orders placed.  Nicci Vaughan was informed that the remainder of the evaluation will be completed by another provider, this initial triage assessment does not replace that evaluation, and the importance of remaining in the ED until their evaluation is complete.     Neysa Thersia RAMAN, [place] 12/30/24 2024

## 2024-12-31 ENCOUNTER — Emergency Department (HOSPITAL_COMMUNITY)

## 2024-12-31 DIAGNOSIS — R600 Localized edema: Secondary | ICD-10-CM | POA: Diagnosis not present

## 2024-12-31 DIAGNOSIS — M7989 Other specified soft tissue disorders: Secondary | ICD-10-CM

## 2024-12-31 MED ORDER — DOXYCYCLINE HYCLATE 100 MG PO CAPS: 100.0000 mg | ORAL_CAPSULE | Freq: Two times a day (BID) | ORAL | 0 refills | Status: AC

## 2024-12-31 NOTE — ED Notes (Signed)
 Pt LLE wrapped with sterile wet gauze and covered with non adherant pad and kerlex. Pt/family educated on wound care for cellulitis, verbalized understanding

## 2024-12-31 NOTE — ED Provider Notes (Signed)
 Care transferred to me.  DVT study is negative.  Does have some asymmetric swelling and some mild warmth to the left leg with some superficial wounds.  Doxycycline  sent in by Dr. Mannie.  Patient is otherwise stable for discharge.  Discussed this with family.   Freddi Hamilton, MD 12/31/24 1650

## 2024-12-31 NOTE — Progress Notes (Signed)
 BLE venous duplex has been completed.  Preliminary results given to Dr. Freddi.   Results can be found under chart review under CV PROC. 12/31/2024 4:39 PM Anzel Kearse RVT, RDMS

## 2024-12-31 NOTE — ED Provider Notes (Signed)
 " [place] EMERGENCY DEPARTMENT AT [facility] Provider Note  CSN: [id number] Arrival date & time: 12/30/24 1536  Chief Complaint(s) Leg Pain  HPI [name] is a 89 y.o. female who is here today for leg swelling.  It has been ongoing for the last 1 month, has now developed some wounds on the medial left leg.  She is followed with her PCP, was recently put on Keflex  without significant improvement.  She was also given steroid cream.  She does not have a history of heart failure.  Denies any fever or chills.  Patient take Xarelto .   Past Medical History Past Medical History:  Diagnosis Date   Abnormal finding on Pap smear, ASCUS    Acute renal insufficiency    Amputation finger    index   Barrett's esophagus    CAD (coronary artery disease)    Dementia (HCC)    Diverticulosis    Elevated BP    Foot deformity    GERD (gastroesophageal reflux disease)    Glaucoma    H/O: GI bleed    HSV-2 (herpes simplex virus 2) infection    Hyperglycemia    Hypertension    Migraine    Osteoporosis    worsening   Spondylosis    Patient Active Problem List   Diagnosis Date Noted   Leg wound, right, initial encounter 12/23/2024   Venous stasis dermatitis of both lower extremities 10/02/2024   Impacted cerumen of both ears 12/17/2023   Sensorineural hearing loss, unilateral, right ear, with unrestricted hearing on the contralateral side 12/17/2023   Post-phlebitic syndrome 11/21/2023   Venous insufficiency of lower extremity 11/21/2023   Gastroesophageal reflux disease with esophagitis without hemorrhage 04/23/2023   Barrett's esophagus without dysplasia 04/23/2023   Acquired thrombophilia 03/05/2023   Chronic deep vein thrombosis (DVT) of right femoral vein (HCC) 03/05/2023   Closed wedge compression fracture of thoracic vertebra (HCC) 08/10/2022   Myofascial pain syndrome 07/07/2021   Degenerative disc disease, lumbar 01/01/2018   Hiatal hernia 11/04/2016   Gastroesophageal  reflux disease without esophagitis 07/31/2014   Dementia (HCC)    Spondylosis    Migraine    CAD (coronary artery disease)    HSV-2 (herpes simplex virus 2) infection    Osteoporosis    Constipation due to slow transit 12/12/2010   ANEMIA, IRON DEFICIENCY 06/17/2008   Essential hypertension 06/16/2008   Home Medication(s) Prior to Admission medications  Medication Sig Start Date End Date Taking? Authorizing Provider  clobetasol  cream (TEMOVATE ) 0.05 % Apply 1 Application topically 2 (two) times daily. 10/02/24   Joshua Debby CROME, MD  Dietary Management Product (VASCULERA PO) Take 30 mg by mouth daily.    [provider]  Dietary Management Product (VASCULERA) TABS Take 1 capsule by mouth daily. 10/02/24   Joshua Debby CROME, MD  Docusate Calcium (STOOL SOFTENER PO) Take by mouth as needed.    [provider]  Ferric Maltol  (ACCRUFER ) 30 MG CAPS Take 1 capsule (30 mg total) by mouth in the morning and at bedtime. 11/26/24   Joshua Debby CROME, MD  gabapentin  (NEURONTIN ) 400 MG capsule Take 1 capsule (400 mg total) by mouth at bedtime. Patient taking differently: Take 400 mg by mouth as needed. 01/27/22   Joshua Debby CROME, MD  Ketotifen Fumarate (ALAWAY OP) Apply to eye as needed (itchy eyes).    [provider]  latanoprost  (XALATAN ) 0.005 % ophthalmic solution INT 1 GTT IN OD QD 08/26/19   [provider]  metoprolol  succinate (TOPROL -XL) 25 MG 24 hr tablet TAKE 1 TABLET(25 MG) BY MOUTH DAILY 10/20/24   Joshua Debby CROME, MD  Multiple Vitamin (MULTIVITAMIN) tablet Take 1 tablet by mouth daily.    [provider]  Multiple Vitamins-Minerals (HAIR SKIN & NAILS ADVANCED) TABS Take 1 mg by mouth daily.    [provider]  Olopatadine HCl (PATADAY OP) Apply 1 drop to eye daily.    [provider]  pantoprazole  (PROTONIX ) 40 MG tablet TAKE 1 TABLET BY MOUTH 1 TO 2 TIMES DAILY 11/03/24   Joshua Debby CROME, MD  Polyethyl Glycol-Propyl Glycol (SYSTANE  OP) Apply to eye daily. Left eye only    [provider]  traMADol -acetaminophen  (ULTRACET ) 37.5-325 MG tablet TAKE 1 TABLET BY MOUTH EVERY 8 HOURS AS NEEDED 10/20/24   Joshua Debby CROME, MD  XARELTO  10 MG TABS tablet TAKE 1 TABLET(10 MG) BY MOUTH DAILY 10/20/24   Joshua Debby CROME, MD                                                                                                                                    Past Surgical History Past Surgical History:  Procedure Laterality Date   AMPUTATION FINGER / THUMB     secondary to osteomyelitis   BIOPSY  04/24/2023   Procedure: BIOPSY;  Surgeon: San Sandor GAILS, DO;  Location: [facility] ENDOSCOPY;  Service: Gastroenterology;;   CATARACT EXTRACTION, BILATERAL Bilateral 12/25/2016   COLONOSCOPY     ESOPHAGOGASTRODUODENOSCOPY N/A 10/22/2016   Procedure: ESOPHAGOGASTRODUODENOSCOPY (EGD);  Surgeon: Victory CROME Legrand DOUGLAS, MD;  Location: [facility] ENDOSCOPY;  Service: Endoscopy;  Laterality: N/A;   ESOPHAGOGASTRODUODENOSCOPY N/A 04/24/2023   Procedure: ESOPHAGOGASTRODUODENOSCOPY (EGD);  Surgeon: San Sandor GAILS, DO;  Location[facility] ENDOSCOPY;  Service: Gastroenterology;  Laterality: N/A;   FOOT SURGERY     right   PARTIAL HYSTERECTOMY     UPPER GASTROINTESTINAL ENDOSCOPY     Family History Family History  Problem Relation Age of Onset   Breast cancer Mother    Heart attack Mother    Heart disease Father    Alcohol  abuse Son    Colon cancer Neg Hx    Stomach cancer Neg Hx    Rectal cancer Neg Hx     Social History Social History[1] Allergies Patient has no known allergies.  Review of Systems Review of Systems  Physical Exam Vital Signs  I have reviewed the triage vital signs BP (!) 172/83 (BP Location: Left Arm)   Pulse (!) 101   Temp 98.3 F (36.8 C)   Resp 18   Ht 4' 2 (1.27 m)   Wt 58.1 kg   SpO2 99%   BMI 36.00 kg/m   Physical Exam Vitals and nursing note reviewed.  Constitutional:      Appearance: She is not  toxic-appearing.  Cardiovascular:     Rate and Rhythm: Tachycardia present.     Pulses: Normal pulses.  Pulmonary:     Effort: Pulmonary effort is normal.  Abdominal:     General: Abdomen is flat.  Skin:    Comments: 1+ lower extremity edema on the left side.  Mild pitting, some skin breakdown on the medial aspect, but no pus, purulence.  No significant ulceration.  No crepitus.  Right leg has some mild swelling, no erythema or warmth.  Neurological:     Mental Status: She is alert.     ED Results and Treatments Labs (all labs ordered are listed, but only abnormal results are displayed) Labs Reviewed  COMPREHENSIVE METABOLIC PANEL WITH GFR  CBC WITH DIFFERENTIAL/PLATELET  PRO BRAIN NATRIURETIC PEPTIDE  I-STAT CG4 LACTIC ACID, ED                                                                                                                          Radiology DG Tibia/Fibula Left Result Date: 12/30/2024 EXAM: VIEW(S) XRAY OF THE LEFT TIBIA AND FIBULA 12/30/2024 09:19:00 PM COMPARISON: None available. CLINICAL HISTORY: Cellulitis FINDINGS: BONES AND JOINTS: No acute fracture. No malalignment. Degenerative changes of the left knee and left midfoot. No cortical erosion or periosteal reaction identified. SOFT TISSUES: Diffuse soft tissue swelling. Vascular calcifications. IMPRESSION: 1. No acute osseous abnormality. 2. Diffuse soft tissue swelling. Electronically signed by: Greig Pique MD 12/30/2024 09:22 PM EST RP Workstation: [id number]    Pertinent labs & imaging results that were available during my care of the patient were reviewed by me and considered in my medical decision making (see MDM for details).  Medications Ordered in ED Medications - No data to display                                                                                                                                   Procedures Procedures  (including critical care time)  Medical Decision Making / ED  Course   This patient presents to the ED for concern of leg swelling, this involves an extensive number of treatment options, and is a complaint that carries with it a high risk of complications and morbidity.  The differential diagnosis includes lymphedema, cellulitis, consider DVT, less likely necrotizing soft tissue infection, less likely traumatic injury.  MDM: Will obtain ultrasound imaging of the patient's lower extremity.  She is on Xarelto  so believe DVT is less likely.  She is supposed to be using compression socks but uses them  infrequently.  Counseled patient and niece at bedside on the importance of compression socks.  Could be developing some cellulitis in that left lower extremity.  If ultrasound is negative, would plan to discharge patient with some doxycycline .  Blood work reviewed, no leukocytosis.  Patient signed out to Dr. Franklyn pending ultrasound.   Additional history obtained: -Additional history obtained from niece at bedside -External records from outside source obtained and reviewed including: Chart review including previous notes, labs, imaging, consultation notes   Lab Tests: -I ordered, reviewed, and interpreted labs.   The pertinent results include:   Labs Reviewed  COMPREHENSIVE METABOLIC PANEL WITH GFR  CBC WITH DIFFERENTIAL/PLATELET  PRO BRAIN NATRIURETIC PEPTIDE  I-STAT CG4 LACTIC ACID, ED      Medicines ordered and prescription drug management: No orders of the defined types were placed in this encounter.   -I have reviewed the patients home medicines and have made adjustments as needed   Cardiac Monitoring: The patient was maintained on a cardiac monitor.  I personally viewed and interpreted the cardiac monitored which showed an underlying rhythm of: Normal sinus rhythm  Social Determinants of Health:  Factors impacting patients care include: Lack of access to primary care   Reevaluation: After the interventions noted above, I reevaluated  the patient and found that they have :improved  Co morbidities that complicate the patient evaluation  Past Medical History:  Diagnosis Date   Abnormal finding on Pap smear, ASCUS    Acute renal insufficiency    Amputation finger    index   Barrett's esophagus    CAD (coronary artery disease)    Dementia (HCC)    Diverticulosis    Elevated BP    Foot deformity    GERD (gastroesophageal reflux disease)    Glaucoma    H/O: GI bleed    HSV-2 (herpes simplex virus 2) infection    Hyperglycemia    Hypertension    Migraine    Osteoporosis    worsening   Spondylosis         Final Clinical Impression(s) / ED Diagnoses Final diagnoses:  Left leg swelling     @PCDICTATION @     [1]  Social History Tobacco Use   Smoking status: Never   Smokeless tobacco: Never  Vaping Use   Vaping status: Never Used  Substance Use Topics   Alcohol  use: No    Alcohol /week: 0.0 standard drinks of alcohol    Drug use: No     Mannie Pac T, DO 12/31/24 1448  "

## 2024-12-31 NOTE — ED Notes (Addendum)
 Pt provided with discharge and follow up instructions, medications discussed, pt verbalized understanding. VSS, pt assisted to vehicle with family, all belongings and paperwork in NAD

## 2024-12-31 NOTE — Discharge Instructions (Addendum)
 While you were in the emergency room, you have an x-ray done of your leg that was normal.  We did an ultrasound of your leg that did not show a blood clot.  We have sent your prescription for an antibiotic called doxycycline .  Take it once in the morning once in the evening for the next 10 days.  Make sure that you are wearing compression socks on your legs.  Follow-up with your primary care doctor within 1 to 2 weeks.

## 2025-01-01 ENCOUNTER — Encounter: Payer: Self-pay | Admitting: Internal Medicine

## 2025-01-01 NOTE — Telephone Encounter (Signed)
 Yes, you can use duoderm

## 2025-01-01 NOTE — Telephone Encounter (Signed)
 The referral was placed for her on 12/23/2024 I do not see a status update on the referral please advise.

## 2025-01-01 NOTE — Telephone Encounter (Signed)
 Copied from CRM [id number]. Topic: Referral - Status >> Jan 01, 2025  2:52 PM Carlyon D wrote: Reason for CRM: Pt niece cheryl calling in regards to status on referral for wound care for pt wound care is needed on the left leg pt was seen in ER yesterday and was in the ER for 27 hours until she could be seen due to the hospitals being at max capacity.  Pt niece is also asking if she can use a Duaderm type product on pt  until wound care can take care of it . Miss Channing is going to send a pic of the wound via mychart.  Miss cheryl would like the referral for wound care for pt asap. Miss cheryl  # [phone] please give her a call of a status update

## 2025-01-08 ENCOUNTER — Encounter (HOSPITAL_BASED_OUTPATIENT_CLINIC_OR_DEPARTMENT_OTHER): Attending: General Surgery | Admitting: General Surgery

## 2025-01-08 DIAGNOSIS — I872 Venous insufficiency (chronic) (peripheral): Secondary | ICD-10-CM | POA: Diagnosis not present

## 2025-01-08 DIAGNOSIS — L97829 Non-pressure chronic ulcer of other part of left lower leg with unspecified severity: Secondary | ICD-10-CM | POA: Diagnosis present

## 2025-01-08 DIAGNOSIS — R600 Localized edema: Secondary | ICD-10-CM | POA: Insufficient documentation

## 2025-01-08 DIAGNOSIS — L97819 Non-pressure chronic ulcer of other part of right lower leg with unspecified severity: Secondary | ICD-10-CM | POA: Diagnosis not present

## 2025-01-15 ENCOUNTER — Encounter (HOSPITAL_BASED_OUTPATIENT_CLINIC_OR_DEPARTMENT_OTHER): Admitting: General Surgery

## 2025-01-15 DIAGNOSIS — L97829 Non-pressure chronic ulcer of other part of left lower leg with unspecified severity: Secondary | ICD-10-CM | POA: Diagnosis not present

## 2025-01-22 ENCOUNTER — Encounter (HOSPITAL_BASED_OUTPATIENT_CLINIC_OR_DEPARTMENT_OTHER): Admitting: General Surgery

## 2025-01-22 DIAGNOSIS — L97829 Non-pressure chronic ulcer of other part of left lower leg with unspecified severity: Secondary | ICD-10-CM | POA: Diagnosis not present

## 2025-01-24 ENCOUNTER — Other Ambulatory Visit: Payer: Self-pay | Admitting: Internal Medicine

## 2025-01-24 DIAGNOSIS — I82511 Chronic embolism and thrombosis of right femoral vein: Secondary | ICD-10-CM

## 2025-01-24 DIAGNOSIS — D6869 Other thrombophilia: Secondary | ICD-10-CM

## 2025-01-29 ENCOUNTER — Other Ambulatory Visit: Payer: Self-pay | Admitting: Internal Medicine

## 2025-01-29 DIAGNOSIS — K219 Gastro-esophageal reflux disease without esophagitis: Secondary | ICD-10-CM

## 2025-03-25 ENCOUNTER — Ambulatory Visit: Admitting: Podiatry

## 2025-03-26 ENCOUNTER — Ambulatory Visit: Admitting: Internal Medicine

## 2025-06-03 ENCOUNTER — Ambulatory Visit

## 2025-06-08 ENCOUNTER — Ambulatory Visit (INDEPENDENT_AMBULATORY_CARE_PROVIDER_SITE_OTHER): Admitting: Otolaryngology
